# Patient Record
Sex: Female | Born: 1950 | Race: White | Hispanic: No | Marital: Single | Smoking: Former smoker
Health system: Southern US, Community
[De-identification: ages and names within clinical notes are randomized; demographics above are authoritative.]

## PROBLEM LIST (undated history)

## (undated) DIAGNOSIS — R7302 Impaired glucose tolerance (oral): Secondary | ICD-10-CM

## (undated) DIAGNOSIS — R0602 Shortness of breath: Secondary | ICD-10-CM

## (undated) DIAGNOSIS — D649 Anemia, unspecified: Secondary | ICD-10-CM

## (undated) DIAGNOSIS — Z803 Family history of malignant neoplasm of breast: Secondary | ICD-10-CM

## (undated) DIAGNOSIS — I2109 ST elevation (STEMI) myocardial infarction involving other coronary artery of anterior wall: Secondary | ICD-10-CM

## (undated) DIAGNOSIS — R112 Nausea with vomiting, unspecified: Secondary | ICD-10-CM

## (undated) DIAGNOSIS — Z9861 Coronary angioplasty status: Principal | ICD-10-CM

## (undated) DIAGNOSIS — I10 Essential (primary) hypertension: Secondary | ICD-10-CM

## (undated) DIAGNOSIS — T4145XA Adverse effect of unspecified anesthetic, initial encounter: Secondary | ICD-10-CM

## (undated) DIAGNOSIS — C801 Malignant (primary) neoplasm, unspecified: Secondary | ICD-10-CM

## (undated) DIAGNOSIS — Z923 Personal history of irradiation: Secondary | ICD-10-CM

## (undated) DIAGNOSIS — Z808 Family history of malignant neoplasm of other organs or systems: Secondary | ICD-10-CM

## (undated) DIAGNOSIS — Z8639 Personal history of other endocrine, nutritional and metabolic disease: Secondary | ICD-10-CM

## (undated) DIAGNOSIS — I2510 Atherosclerotic heart disease of native coronary artery without angina pectoris: Secondary | ICD-10-CM

## (undated) DIAGNOSIS — M069 Rheumatoid arthritis, unspecified: Secondary | ICD-10-CM

## (undated) DIAGNOSIS — N189 Chronic kidney disease, unspecified: Secondary | ICD-10-CM

## (undated) DIAGNOSIS — Z87891 Personal history of nicotine dependence: Secondary | ICD-10-CM

## (undated) DIAGNOSIS — T8859XA Other complications of anesthesia, initial encounter: Secondary | ICD-10-CM

## (undated) DIAGNOSIS — Z9889 Other specified postprocedural states: Secondary | ICD-10-CM

## (undated) DIAGNOSIS — E785 Hyperlipidemia, unspecified: Secondary | ICD-10-CM

## (undated) HISTORY — DX: Coronary angioplasty status: Z98.61

## (undated) HISTORY — DX: Hyperlipidemia, unspecified: E78.5

## (undated) HISTORY — DX: Personal history of other endocrine, nutritional and metabolic disease: Z86.39

## (undated) HISTORY — PX: APPENDECTOMY: SHX54

## (undated) HISTORY — DX: Impaired glucose tolerance (oral): R73.02

## (undated) HISTORY — DX: Essential (primary) hypertension: I10

## (undated) HISTORY — DX: ST elevation (STEMI) myocardial infarction involving other coronary artery of anterior wall: I21.09

## (undated) HISTORY — DX: Family history of malignant neoplasm of breast: Z80.3

## (undated) HISTORY — PX: BREAST EXCISIONAL BIOPSY: SUR124

## (undated) HISTORY — DX: Atherosclerotic heart disease of native coronary artery without angina pectoris: I25.10

## (undated) HISTORY — DX: Malignant (primary) neoplasm, unspecified: C80.1

## (undated) HISTORY — DX: Family history of malignant neoplasm of other organs or systems: Z80.8

## (undated) HISTORY — DX: Personal history of nicotine dependence: Z87.891

---
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
## 1993-10-15 HISTORY — PX: ANKLE SURGERY: SHX546

## 1998-12-23 ENCOUNTER — Other Ambulatory Visit: Admission: RE | Admit: 1998-12-23 | Discharge: 1998-12-23 | Payer: Self-pay | Admitting: Obstetrics and Gynecology

## 2000-01-19 ENCOUNTER — Encounter: Payer: Self-pay | Admitting: *Deleted

## 2000-01-19 ENCOUNTER — Encounter: Admission: RE | Admit: 2000-01-19 | Discharge: 2000-01-19 | Payer: Self-pay | Admitting: *Deleted

## 2001-05-21 ENCOUNTER — Encounter: Admission: RE | Admit: 2001-05-21 | Discharge: 2001-05-21 | Payer: Self-pay | Admitting: *Deleted

## 2001-05-21 ENCOUNTER — Encounter: Payer: Self-pay | Admitting: *Deleted

## 2001-09-17 ENCOUNTER — Other Ambulatory Visit: Admission: RE | Admit: 2001-09-17 | Discharge: 2001-09-17 | Payer: Self-pay | Admitting: Obstetrics and Gynecology

## 2002-08-05 ENCOUNTER — Encounter: Payer: Self-pay | Admitting: *Deleted

## 2002-08-05 ENCOUNTER — Encounter: Admission: RE | Admit: 2002-08-05 | Discharge: 2002-08-05 | Payer: Self-pay | Admitting: *Deleted

## 2002-10-09 ENCOUNTER — Other Ambulatory Visit: Admission: RE | Admit: 2002-10-09 | Discharge: 2002-10-09 | Payer: Self-pay | Admitting: Obstetrics & Gynecology

## 2003-08-11 ENCOUNTER — Ambulatory Visit (HOSPITAL_COMMUNITY): Admission: RE | Admit: 2003-08-11 | Discharge: 2003-08-11 | Payer: Self-pay | Admitting: *Deleted

## 2003-11-01 ENCOUNTER — Other Ambulatory Visit: Admission: RE | Admit: 2003-11-01 | Discharge: 2003-11-01 | Payer: Self-pay | Admitting: Obstetrics & Gynecology

## 2003-11-18 ENCOUNTER — Encounter: Admission: RE | Admit: 2003-11-18 | Discharge: 2003-11-18 | Payer: Self-pay | Admitting: Obstetrics & Gynecology

## 2004-08-11 ENCOUNTER — Ambulatory Visit (HOSPITAL_COMMUNITY): Admission: RE | Admit: 2004-08-11 | Discharge: 2004-08-11 | Payer: Self-pay

## 2005-08-17 ENCOUNTER — Ambulatory Visit (HOSPITAL_COMMUNITY): Admission: RE | Admit: 2005-08-17 | Discharge: 2005-08-17 | Payer: Self-pay | Admitting: *Deleted

## 2006-08-30 ENCOUNTER — Ambulatory Visit (HOSPITAL_COMMUNITY): Admission: RE | Admit: 2006-08-30 | Discharge: 2006-08-30 | Payer: Self-pay | Admitting: Obstetrics and Gynecology

## 2007-09-04 ENCOUNTER — Ambulatory Visit (HOSPITAL_COMMUNITY): Admission: RE | Admit: 2007-09-04 | Discharge: 2007-09-04 | Payer: Self-pay | Admitting: Obstetrics and Gynecology

## 2008-09-07 ENCOUNTER — Ambulatory Visit (HOSPITAL_COMMUNITY): Admission: RE | Admit: 2008-09-07 | Discharge: 2008-09-07 | Payer: Self-pay | Admitting: Obstetrics and Gynecology

## 2009-09-16 ENCOUNTER — Ambulatory Visit (HOSPITAL_COMMUNITY): Admission: RE | Admit: 2009-09-16 | Discharge: 2009-09-16 | Payer: Self-pay | Admitting: Obstetrics and Gynecology

## 2010-09-22 ENCOUNTER — Ambulatory Visit (HOSPITAL_COMMUNITY)
Admission: RE | Admit: 2010-09-22 | Discharge: 2010-09-22 | Payer: Self-pay | Source: Home / Self Care | Attending: Obstetrics and Gynecology | Admitting: Obstetrics and Gynecology

## 2010-11-03 ENCOUNTER — Encounter
Admission: RE | Admit: 2010-11-03 | Discharge: 2010-11-03 | Payer: No Typology Code available for payment source | Source: Home / Self Care | Attending: Obstetrics and Gynecology | Admitting: Obstetrics and Gynecology

## 2011-08-16 DIAGNOSIS — I2510 Atherosclerotic heart disease of native coronary artery without angina pectoris: Secondary | ICD-10-CM | POA: Insufficient documentation

## 2011-08-16 DIAGNOSIS — Z9861 Coronary angioplasty status: Secondary | ICD-10-CM | POA: Insufficient documentation

## 2011-08-16 DIAGNOSIS — I2109 ST elevation (STEMI) myocardial infarction involving other coronary artery of anterior wall: Secondary | ICD-10-CM

## 2011-08-16 HISTORY — DX: ST elevation (STEMI) myocardial infarction involving other coronary artery of anterior wall: I21.09

## 2011-08-16 HISTORY — DX: Atherosclerotic heart disease of native coronary artery without angina pectoris: I25.10

## 2011-08-16 HISTORY — DX: Coronary angioplasty status: Z98.61

## 2011-08-16 HISTORY — PX: CORONARY ANGIOPLASTY WITH STENT PLACEMENT: SHX49

## 2011-08-16 HISTORY — PX: CARDIAC CATHETERIZATION: SHX172

## 2011-08-16 HISTORY — PX: TRANSTHORACIC ECHOCARDIOGRAM: SHX275

## 2011-08-27 ENCOUNTER — Other Ambulatory Visit: Payer: Self-pay

## 2011-08-27 ENCOUNTER — Emergency Department (HOSPITAL_COMMUNITY): Payer: No Typology Code available for payment source

## 2011-08-27 ENCOUNTER — Inpatient Hospital Stay (HOSPITAL_COMMUNITY)
Admission: EM | Admit: 2011-08-27 | Discharge: 2011-08-31 | DRG: 246 | Disposition: A | Discharge status: Home / Self Care | Payer: No Typology Code available for payment source | Attending: Cardiology | Admitting: Cardiology

## 2011-08-27 ENCOUNTER — Encounter (HOSPITAL_COMMUNITY)
Admission: EM | Disposition: A | Discharge status: Home / Self Care | Payer: Self-pay | Source: Home / Self Care | Attending: Cardiology

## 2011-08-27 ENCOUNTER — Encounter: Payer: Self-pay | Admitting: Emergency Medicine

## 2011-08-27 DIAGNOSIS — I472 Ventricular tachycardia, unspecified: Secondary | ICD-10-CM | POA: Diagnosis present

## 2011-08-27 DIAGNOSIS — F17200 Nicotine dependence, unspecified, uncomplicated: Secondary | ICD-10-CM | POA: Diagnosis present

## 2011-08-27 DIAGNOSIS — I469 Cardiac arrest, cause unspecified: Secondary | ICD-10-CM | POA: Diagnosis present

## 2011-08-27 DIAGNOSIS — E876 Hypokalemia: Secondary | ICD-10-CM | POA: Diagnosis present

## 2011-08-27 DIAGNOSIS — R112 Nausea with vomiting, unspecified: Secondary | ICD-10-CM | POA: Diagnosis present

## 2011-08-27 DIAGNOSIS — I10 Essential (primary) hypertension: Secondary | ICD-10-CM | POA: Diagnosis present

## 2011-08-27 DIAGNOSIS — I498 Other specified cardiac arrhythmias: Secondary | ICD-10-CM | POA: Diagnosis present

## 2011-08-27 DIAGNOSIS — I213 ST elevation (STEMI) myocardial infarction of unspecified site: Secondary | ICD-10-CM | POA: Diagnosis present

## 2011-08-27 DIAGNOSIS — I2109 ST elevation (STEMI) myocardial infarction involving other coronary artery of anterior wall: Principal | ICD-10-CM | POA: Diagnosis present

## 2011-08-27 DIAGNOSIS — Z720 Tobacco use: Secondary | ICD-10-CM | POA: Diagnosis present

## 2011-08-27 DIAGNOSIS — I4901 Ventricular fibrillation: Secondary | ICD-10-CM | POA: Diagnosis present

## 2011-08-27 DIAGNOSIS — E7800 Pure hypercholesterolemia, unspecified: Secondary | ICD-10-CM | POA: Diagnosis present

## 2011-08-27 HISTORY — PX: ABDOMINAL AORTAGRAM: SHX5454

## 2011-08-27 HISTORY — PX: PERCUTANEOUS CORONARY STENT INTERVENTION (PCI-S): SHX5485

## 2011-08-27 HISTORY — PX: LEFT HEART CATHETERIZATION WITH CORONARY ANGIOGRAM: SHX5451

## 2011-08-27 LAB — CK TOTAL AND CKMB (NOT AT ARMC): CK, MB: 2.3 ng/mL (ref 0.3–4.0)

## 2011-08-27 LAB — POCT I-STAT, CHEM 8
Calcium, Ion: 1.06 mmol/L — ABNORMAL LOW (ref 1.12–1.32)
Chloride: 105 mEq/L (ref 96–112)
Glucose, Bld: 165 mg/dL — ABNORMAL HIGH (ref 70–99)
HCT: 44 % (ref 36.0–46.0)
Hemoglobin: 15 g/dL (ref 12.0–15.0)
TCO2: 23 mmol/L (ref 0–100)

## 2011-08-27 LAB — CBC
HCT: 41.8 % (ref 36.0–46.0)
MCH: 28.7 pg (ref 26.0–34.0)
MCHC: 32.8 g/dL (ref 30.0–36.0)
MCV: 87.4 fL (ref 78.0–100.0)
RDW: 12.2 % (ref 11.5–15.5)

## 2011-08-27 LAB — POCT ACTIVATED CLOTTING TIME: Activated Clotting Time: 557 seconds

## 2011-08-27 LAB — COMPREHENSIVE METABOLIC PANEL
Albumin: 4 g/dL (ref 3.5–5.2)
Alkaline Phosphatase: 88 U/L (ref 39–117)
BUN: 14 mg/dL (ref 6–23)
Chloride: 101 mEq/L (ref 96–112)
Potassium: 3.2 mEq/L — ABNORMAL LOW (ref 3.5–5.1)
Total Bilirubin: 0.3 mg/dL (ref 0.3–1.2)

## 2011-08-27 LAB — PROTIME-INR: Prothrombin Time: 13.3 seconds (ref 11.6–15.2)

## 2011-08-27 LAB — PRO B NATRIURETIC PEPTIDE: Pro B Natriuretic peptide (BNP): 72.8 pg/mL (ref 0–125)

## 2011-08-27 SURGERY — LEFT HEART CATHETERIZATION WITH CORONARY ANGIOGRAM
Anesthesia: LOCAL

## 2011-08-27 MED ORDER — POTASSIUM CHLORIDE 20 MEQ/15ML (10%) PO LIQD
ORAL | Status: AC
  Filled 2011-08-27: qty 30

## 2011-08-27 MED ORDER — ACETAMINOPHEN 325 MG PO TABS: 650.0000 mg | ORAL_TABLET | ORAL | Status: DC | PRN

## 2011-08-27 MED ORDER — DEXTROSE 5 % IV SOLN
60.0000 mg/h | INTRAVENOUS | Status: DC
  Administered 2011-08-27: 60 mg/h via INTRAVENOUS
  Filled 2011-08-27: qty 9

## 2011-08-27 MED ORDER — PROMETHAZINE HCL 25 MG/ML IJ SOLN
12.5000 mg | Freq: Once | INTRAMUSCULAR | Status: AC
  Administered 2011-08-27: 12.5 mg via INTRAVENOUS

## 2011-08-27 MED ORDER — MORPHINE SULFATE 2 MG/ML IJ SOLN
2.0000 mg | INTRAMUSCULAR | Status: DC | PRN
  Administered 2011-08-27: 2 mg via INTRAVENOUS
  Filled 2011-08-27: qty 1

## 2011-08-27 MED ORDER — NITROGLYCERIN IN D5W 200-5 MCG/ML-% IV SOLN
2.0000 ug/min | INTRAVENOUS | Status: DC
  Administered 2011-08-27: 10 ug/min via INTRAVENOUS

## 2011-08-27 MED ORDER — DEXTROSE 5 % IV SOLN
150.0000 mg | Freq: Once | INTRAVENOUS | Status: DC
  Administered 2011-08-27: 150 mg via INTRAVENOUS

## 2011-08-27 MED ORDER — ZOLPIDEM TARTRATE 10 MG PO TABS
10.0000 mg | ORAL_TABLET | Freq: Every evening | ORAL | Status: DC | PRN
  Administered 2011-08-29 – 2011-08-30 (×2): 10 mg via ORAL
  Filled 2011-08-27 (×2): qty 2

## 2011-08-27 MED ORDER — ASPIRIN 81 MG PO CHEW
CHEWABLE_TABLET | ORAL | Status: AC
  Administered 2011-08-28: 81 mg via ORAL
  Filled 2011-08-27: qty 4

## 2011-08-27 MED ORDER — NITROGLYCERIN 0.4 MG SL SUBL: 0.4000 mg | SUBLINGUAL_TABLET | SUBLINGUAL | Status: DC | PRN

## 2011-08-27 MED ORDER — TICAGRELOR 90 MG PO TABS
90.0000 mg | ORAL_TABLET | Freq: Two times a day (BID) | ORAL | Status: DC
  Administered 2011-08-27 – 2011-08-31 (×8): 90 mg via ORAL
  Filled 2011-08-27 (×9): qty 1

## 2011-08-27 MED ORDER — SODIUM CHLORIDE 0.9 % IV SOLN
0.2500 mg/kg/h | INTRAVENOUS | Status: AC
  Administered 2011-08-27: 0.25 mg/kg/h via INTRAVENOUS
  Filled 2011-08-27 (×5): qty 250

## 2011-08-27 MED ORDER — HEPARIN BOLUS VIA INFUSION
4000.0000 [IU] | Freq: Once | INTRAVENOUS | Status: AC
  Administered 2011-08-27: 4000 [IU] via INTRAVENOUS

## 2011-08-27 MED ORDER — ROSUVASTATIN CALCIUM 40 MG PO TABS
40.0000 mg | ORAL_TABLET | Freq: Every day | ORAL | Status: DC
  Administered 2011-08-28 – 2011-08-30 (×2): 40 mg via ORAL
  Filled 2011-08-27 (×6): qty 1

## 2011-08-27 MED ORDER — HEPARIN SODIUM (PORCINE) 5000 UNIT/ML IJ SOLN
INTRAMUSCULAR | Status: AC
  Filled 2011-08-27: qty 1

## 2011-08-27 MED ORDER — ASPIRIN 81 MG PO TABS
81.0000 mg | ORAL_TABLET | Freq: Every day | ORAL | Status: DC
  Filled 2011-08-27: qty 1

## 2011-08-27 MED ORDER — HEPARIN (PORCINE) IN NACL 2-0.9 UNIT/ML-% IJ SOLN
INTRAMUSCULAR | Status: AC
  Filled 2011-08-27: qty 2000

## 2011-08-27 MED ORDER — SODIUM CHLORIDE 0.9 % IV SOLN
INTRAVENOUS | Status: DC
  Administered 2011-08-27 (×2): via INTRAVENOUS

## 2011-08-27 MED ORDER — ONDANSETRON HCL 4 MG/2ML IJ SOLN: 4.0000 mg | Freq: Four times a day (QID) | INTRAMUSCULAR | Status: DC | PRN

## 2011-08-27 MED ORDER — NITROGLYCERIN 0.2 MG/ML ON CALL CATH LAB
INTRAVENOUS | Status: AC
  Filled 2011-08-27: qty 1

## 2011-08-27 MED ORDER — LIDOCAINE HCL (PF) 1 % IJ SOLN
INTRAMUSCULAR | Status: AC
  Filled 2011-08-27: qty 30

## 2011-08-27 MED ORDER — ONDANSETRON HCL 4 MG/2ML IJ SOLN
4.0000 mg | Freq: Four times a day (QID) | INTRAMUSCULAR | Status: DC | PRN
  Administered 2011-08-27: 4 mg via INTRAVENOUS
  Filled 2011-08-27: qty 2

## 2011-08-27 MED ORDER — TICAGRELOR 90 MG PO TABS
ORAL_TABLET | ORAL | Status: AC
  Filled 2011-08-27: qty 2

## 2011-08-27 MED ORDER — PNEUMOCOCCAL VAC POLYVALENT 25 MCG/0.5ML IJ INJ
0.5000 mL | INJECTION | INTRAMUSCULAR | Status: AC
  Administered 2011-08-28: 0.5 mL via INTRAMUSCULAR
  Filled 2011-08-27: qty 0.5

## 2011-08-27 MED ORDER — FENTANYL CITRATE 0.05 MG/ML IJ SOLN
INTRAMUSCULAR | Status: AC
  Filled 2011-08-27: qty 2

## 2011-08-27 MED ORDER — MIDAZOLAM HCL 2 MG/2ML IJ SOLN
INTRAMUSCULAR | Status: AC
  Filled 2011-08-27: qty 2

## 2011-08-27 MED ORDER — ONDANSETRON HCL 4 MG/2ML IJ SOLN
INTRAMUSCULAR | Status: AC
Start: 2011-08-27 — End: 2011-08-27
  Filled 2011-08-27: qty 2

## 2011-08-27 MED ORDER — ASPIRIN 81 MG PO CHEW
81.0000 mg | CHEWABLE_TABLET | Freq: Every day | ORAL | Status: DC
  Administered 2011-08-28 – 2011-08-30 (×3): 81 mg via ORAL
  Filled 2011-08-27 (×3): qty 1

## 2011-08-27 MED ORDER — ASPIRIN 300 MG RE SUPP: 300.0000 mg | RECTAL | Status: AC

## 2011-08-27 MED ORDER — BIOTENE DRY MOUTH MT LIQD: 15.0000 mL | OROMUCOSAL | Status: DC | PRN

## 2011-08-27 MED ORDER — ASPIRIN 81 MG PO CHEW: 324.0000 mg | CHEWABLE_TABLET | Freq: Once | ORAL | Status: DC

## 2011-08-27 MED ORDER — DEXTROSE 5 % IV SOLN: 150.0000 mg | Freq: Once | INTRAVENOUS | Status: DC

## 2011-08-27 MED FILL — Medication: Qty: 1 | Status: AC

## 2011-08-27 NOTE — Progress Notes (Signed)
Spiritual Care   Provided emotional and spiritual support to family while they patient was in procedure.  Stayed with them until their own pastor arrived.  Althia Forts [phone]

## 2011-08-27 NOTE — Progress Notes (Signed)
At 1515 the femoral sheath was removed and manual pressure held for 20 minutes with Seth Bake RN present at bedside for the first 5 minutes. Vital signs were stable pre and post sheath removal. Blood pressure remained 14080,s and Heart Rate in the 90's. Hemostasis achieved and no oozing or hematoma noted. Luellen Pucker RN notified and verified hemostasis and Level 0. Patient given post sheath removal instructions and verbally states understanding. Bedrest starts at 1535.

## 2011-08-27 NOTE — ED Provider Notes (Signed)
History     CSN: [id number] Arrival date & time: 08/27/2011  7:39 AM   First MD Initiated Contact with Patient 08/27/11 [phone]      Chief Complaint  Patient presents with  . Back Pain    (Consider location/radiation/quality/duration/timing/severity/associated sxs/prior treatment) HPI Comments: Prior to going to work, pt developed severe pain in upper back, aching, radiation to shoulder - diaphoretic, tried to make it to work without imrpvoement - left from work to hospital, on arrival wrapping around to the chest.  No hx of MI, DM, Ht.  Patient is a 60 y.o. female presenting with back pain. The history is provided by the patient.  Back Pain  This is a new problem. The current episode started 1 to 2 hours ago. The problem occurs constantly. The problem has been gradually worsening. Associated with: acute onset of pain in upper back with radiation to the shoulder, then around to the chest 2 hours pta. The quality of the pain is described as aching. The pain is at a severity of 10/10. The pain is moderate. Exacerbated by: nothing. The pain is the same all the time. Associated symptoms include chest pain. Pertinent negatives include no fever, no numbness, no headaches, no abdominal pain, no dysuria and no weakness. Treatments tried: no meds pta. Risk factors: cholesterol and tob use.    Past Medical History  Diagnosis Date  . Hypercholesteremia     History reviewed. No pertinent past surgical history.  History reviewed. No pertinent family history.  History  Substance Use Topics  . Smoking status: Current Everyday Smoker  . Smokeless tobacco: Not on file  . Alcohol Use: No    OB History    Grav Para Term Preterm Abortions TAB SAB Ect Mult Living                  Review of Systems  Constitutional: Negative for fever.  Cardiovascular: Positive for chest pain.  Gastrointestinal: Negative for abdominal pain.  Genitourinary: Negative for dysuria.  Musculoskeletal: Positive for  back pain.  Neurological: Negative for weakness, numbness and headaches.  All other systems reviewed and are negative.    Allergies  Review of patient's allergies indicates no known allergies.  Home Medications  No current outpatient prescriptions on file.  BP 163/98  Pulse 93  Temp(Src) 97.6 F (36.4 C) (Oral)  Resp 20  SpO2 99%  Physical Exam  Nursing note and vitals reviewed. Constitutional: She appears well-developed and well-nourished.       Uncomfortable appearing   HENT:  Head: Normocephalic and atraumatic.  Mouth/Throat: Oropharynx is clear and moist. No oropharyngeal exudate.  Eyes: Conjunctivae and EOM are normal. Pupils are equal, round, and reactive to light. Right eye exhibits no discharge. Left eye exhibits no discharge. No scleral icterus.  Neck: Normal range of motion. Neck supple. No JVD present. No thyromegaly present.  Cardiovascular: Normal rate, regular rhythm, normal heart sounds and intact distal pulses.  Exam reveals no gallop and no friction rub.   No murmur heard. Pulmonary/Chest: Effort normal and breath sounds normal. No respiratory distress. She has no wheezes. She has no rales.  Abdominal: Soft. Bowel sounds are normal. She exhibits no distension and no mass. There is no tenderness.  Musculoskeletal: Normal range of motion. She exhibits no edema and no tenderness.  Lymphadenopathy:    She has no cervical adenopathy.  Neurological: She is alert. Coordination normal.  Skin: Skin is warm. No rash noted. She is diaphoretic. No erythema.  Psychiatric: She  has a normal mood and affect. Her behavior is normal.    ED Course  Procedures (including critical care time)  Labs Reviewed  CBC - Abnormal; Notable for the following:    WBC 10.9 (*)    All other components within normal limits  POCT I-STAT, CHEM 8 - Abnormal; Notable for the following:    Potassium 3.3 (*)    Glucose, Bld 165 (*)    Calcium, Ion 1.06 (*)    All other components within  normal limits  POCT I-STAT TROPONIN I  PROTIME-INR  APTT  I-STAT, CHEM 8  I-STAT TROPONIN I  CK TOTAL AND CKMB  COMPREHENSIVE METABOLIC PANEL  PRO B NATRIURETIC PEPTIDE  TROPONIN I   No results found.   No diagnosis found.    MDM  Pt ill appearing with ECG that showed STEMI - immediately called Code STEMI.    Peripheral IV was placed and immediately she then proceeded to go into Ventricular Fibrillation arrest - lost pulse, monitor showed V fib and I immediately placed her on Zoll Def - began CPR, provided supplemental O2 and defibrillated at 200J biphasic.  There was no ROSC, I continued CPR for the next 10 minutes and pt was defibrillated another 3 times finally returning to an initial bradycardia with a pulse followed by Sinus Tachycardia.  Her BP normalized, she was given 150mg  of amiodarone.  During this resucitation she became nauseated and vomitted multiple times.  We prepared for intubation prior to ROSC but after she had received a small am't of etomidate she awoke and regained a pulse.  Her care was immediately d/w Dr. Claiborne Billings who agreed to go straight to Cath lab.    Medications given:  ASA 325 Heparin 4000Units Amiodarone 150mg   Procedures performed by myself  1.  CPR (continuous for extent of pulseless period with good pulses with compressions) (emergent situation)  2.  Defibrillation X 4 (emergent situation)    I accompanied pt to Cath lab and made transition of care to Dr. Claiborne Billings at the bedside.    ED ECG REPORT   Date: 08/27/2011   Rate: 95  Rhythm: normal sinus rhythm  QRS Axis: normal  Intervals: normal  ST/T Wave abnormalities: ST elevations anteriorly, ST elevations laterally, ST depressions inferiorly and acute myocardial infarction  Conduction Disutrbances:none  Narrative Interpretation: STEMI  Old EKG Reviewed: none available  CRITICAL CARE Performed by: Johnna Acosta   Total critical care time: 30  Critical care time was exclusive of  separately billable procedures and treating other patients.  Critical care was necessary to treat or prevent imminent or life-threatening deterioration.  Critical care was time spent personally by me on the following activities: development of treatment plan with patient and/or surrogate as well as nursing, discussions with consultants, evaluation of patient's response to treatment, examination of patient, obtaining history from patient or surrogate, ordering and performing treatments and interventions, ordering and review of laboratory studies, ordering and review of radiographic studies, pulse oximetry and re-evaluation of patient's condition.   Johnna Acosta, MD 08/27/11 [phone]

## 2011-08-27 NOTE — H&P (Addendum)
Samantha Mcbride is an 60 y.o. female.   Chief Complaint: Chest Pain HPI: 60 yo female with a history of dyslipidemia and tobacco use presents with Anterior STEMI and ventricular fibrillation requiring CPR.   She was taken urgently to the cath lab where she was found to have a totalled LAD.  She subsequently received a DES to the LAD.  The patient reports the development of back pain at approximately 0600hrs today.  She went to work and the pain got worse.  She also became diaphoretic and vomited several times.  She then drove herself to the emergency room having to stop and vomit along the way.  Past Medical History  Diagnosis Date  . Hypercholesteremia     History reviewed. No pertinent past surgical history.  History reviewed. No pertinent family history. Social History:  reports that she has been smoking.  She does not have any smokeless tobacco history on file. She reports that she does not drink alcohol or use illicit drugs.  Medications Prior to Admission  Medication Dose Route Frequency Provider Last Rate Last Dose  . amiodarone (CORDARONE) 150 mg in dextrose 5 % 100 mL bolus  150 mg Intravenous Once Meera Krishnavadan Patel, [facility]      . amiodarone (CORDARONE) 450 mg in dextrose 5 % 250 mL infusion  60 mg/hr Intravenous Continuous Troy Sine, MD      . aspirin 81 MG chewable tablet           . aspirin chewable tablet 324 mg  324 mg Oral Once Johnna Acosta, MD      . fentaNYL (SUBLIMAZE) 0.05 MG/ML injection           . heparin 100 units/mL bolus via infusion 4,000 Units  4,000 Units Intravenous Once Johnna Acosta, MD   4,000 Units at 08/27/11 0750  . heparin 2-0.9 UNIT/ML-% infusion           . heparin 5000 UNIT/ML injection           . midazolam (VERSED) 2 MG/2ML injection           . nitroGLYCERIN (NTG ON-CALL) 0.2 mg/mL injection           . ondansetron (ZOFRAN) 4 MG/2ML injection           . Ticagrelor (BRILINTA) 90 MG tablet           . DISCONTD: amiodarone (CORDARONE)  150 mg in dextrose 5 % 100 mL bolus  150 mg Intravenous Once Johnna Acosta, MD   150 mg at 08/27/11 0830   No current outpatient prescriptions on file as of 08/27/2011.   Prescriptions prior to admission  Medication Sig Dispense Refill  . aspirin 81 MG tablet Take 81 mg by mouth daily.        Marland Kitchen atorvastatin (LIPITOR) 10 MG tablet Take 10 mg by mouth daily.        . traZODone (DESYREL)  Take by mouth at bedtime. Dose unknown         Allergies: No Known Allergies  Medications Prior to Admission  Medication Dose Route Frequency Provider Last Rate Last Dose  . amiodarone (CORDARONE) 150 mg in dextrose 5 % 100 mL bolus  150 mg Intravenous Once Johnna Acosta, MD   150 mg at 08/27/11 0830  . amiodarone (CORDARONE) 450 mg in dextrose 5 % 250 mL infusion  60 mg/hr Intravenous Continuous Troy Sine, MD      . aspirin 81  MG chewable tablet           . aspirin chewable tablet 324 mg  324 mg Oral Once Johnna Acosta, MD      . fentaNYL (SUBLIMAZE) 0.05 MG/ML injection           . heparin 100 units/mL bolus via infusion 4,000 Units  4,000 Units Intravenous Once Johnna Acosta, MD   4,000 Units at 08/27/11 0750  . heparin 2-0.9 UNIT/ML-% infusion           . heparin 5000 UNIT/ML injection           . midazolam (VERSED) 2 MG/2ML injection           . nitroGLYCERIN (NTG ON-CALL) 0.2 mg/mL injection           . ondansetron (ZOFRAN) 4 MG/2ML injection           . Ticagrelor (BRILINTA) 90 MG tablet            No current outpatient prescriptions on file as of 08/27/2011.    Results for orders placed during the hospital encounter of 08/27/11 (from the past 48 hour(s))  CBC     Status: Abnormal   Collection Time   08/27/11  7:43 AM      Component Value Range Comment   WBC 10.9 (*) 4.0 - 10.5 (K/uL)    RBC 4.78  3.87 - 5.11 (MIL/uL)    Hemoglobin 13.7  12.0 - 15.0 (g/dL)    HCT 41.8  36.0 - 46.0 (%)    MCV 87.4  78.0 - 100.0 (fL)    MCH 28.7  26.0 - 34.0 (pg)    MCHC 32.8  30.0 - 36.0 (g/dL)     RDW 12.2  11.5 - 15.5 (%)    Platelets 291  150 - 400 (K/uL)   POCT I-STAT TROPONIN I     Status: Normal   Collection Time   08/27/11  7:45 AM      Component Value Range Comment   Troponin i, poc 0.03  0.00 - 0.08 (ng/mL)    Comment 3            POCT I-STAT, CHEM 8     Status: Abnormal   Collection Time   08/27/11  7:47 AM      Component Value Range Comment   Sodium 140  135 - 145 (mEq/L)    Potassium 3.3 (*) 3.5 - 5.1 (mEq/L)    Chloride 105  96 - 112 (mEq/L)    BUN 15  6 - 23 (mg/dL)    Creatinine, Ser 0.70  0.50 - 1.10 (mg/dL)    Glucose, Bld 165 (*) 70 - 99 (mg/dL)    Calcium, Ion 1.06 (*) 1.12 - 1.32 (mmol/L)    TCO2 23  0 - 100 (mmol/L)    Hemoglobin 15.0  12.0 - 15.0 (g/dL)    HCT 44.0  36.0 - 46.0 (%)   PROTIME-INR     Status: Normal   Collection Time   08/27/11  7:47 AM      Component Value Range Comment   Prothrombin Time 13.3  11.6 - 15.2 (seconds)    INR 0.99  0.00 - 1.49    APTT     Status: Normal   Collection Time   08/27/11  7:47 AM      Component Value Range Comment   aPTT 25  24 - 37 (seconds)    No results found.  Review of Systems  Unable to perform ROS   Blood pressure 163/98, pulse 93, temperature 97.6 F (36.4 C), temperature source Oral, resp. rate 20, SpO2 99.00%. Physical Exam   Assessment/Plan Patient Active Hospital Problem List: STEMI (ST elevation myocardial infarction) (08/27/2011)  DES to the LAD CAD (coronary artery disease) (08/27/2011) Hypokalemia Dyslipidemia   Plan: Per MD.  Brett Canales 08/27/2011, 8:55 AM

## 2011-08-27 NOTE — ED Notes (Signed)
Primary assessment done by this RN

## 2011-08-27 NOTE — Interval H&P Note (Signed)
History and Physical Interval Note:   08/27/2011   9:44 AM   Samantha Mcbride  has presented today for surgery, with the diagnosis of chest pain  The various methods of treatment have been discussed with the patient and family. After consideration of risks, benefits and other options for treatment, the patient has consented to  Procedure(s): Hurricane (PCI-S) ABDOMINAL AORTAGRAM as a surgical intervention .  The patients' history has been reviewed, patient examined, no change in status, stable for surgery.  I have reviewed the patients' chart and labs.  Questions were answered to the patient's satisfaction.     KELLY,THOMAS A  MD Pt seen and examined. Patient developed new-onset back discomfort at approximately 6 AM this morning while at home. She went to work. While at work for chest pain be her back pain evolves into chest pain. She became diaphoretic, nauseated and vomited. She drove herself to the emergency [facility] emergency room where in route she also had a noted episode of vomiting. Upon arrival to the [facility] emergency room ECG revealed acute ST segment elevation anteriorly consistent with an anterior wall ST segment elevation myocardial infarction. In the emergency room, the patient became hypotensive, but developed ventricular fibrillation. She was successfully defibrillated back into a more stable rhythm. She is now presents acutely to the cardiac catheterization laboratory for emergent cardiac catheterization and possible percutaneous coronary intervention. KELLY,THOMAS A 08/27/2011 9:49 AM

## 2011-08-27 NOTE — Op Note (Signed)
Emergent cardiac catheterization and percutaneous coronary intervention .  Samantha Mcbride medical record [id number] date of birth [birth year]   This is Samantha Mcbride is a 60 year old female who has a history of hyperlipidemia and hypertension. She she developed new onset back discomfort early this morning at approximately 6 AM . She went to work while at work her back pain grassed to involve her chest this was associated with the development of diaphoresis, nausea and vomiting. She left work, and drove herself to [facility]. In route, she had to stop secondary to vomiting. She presented to [facility] emergency room and initial ECG showed ST segment elevation anterior wall myocardial infarction. While in the emergency room, she developed a ventricular fibrillation cardiac arrest, and required defibrillation as well as CPR. She came back with a narrow complex rhythm. She is now taken emergently to the cardiac catheterization laboratory for acute catheterization.  Procedure: Upon arrival to the cardiac catheterization laboratory, patient had residual chest tightness. A right femoral artery was punctured anteriorly and a 6 French sheath was inserted. A 6 French left JR 4 diagnostic catheter was used for selective angiography into the left coronary system. A 6 French right catheter was used for selective angiography into the right current artery. With the demonstration of total occlusion of the proximal LAD, a 6 French XB LAD guiding catheter was then inserted. Angiomax bolus plus infusion was administered. The patient was given relent to 180 mg orally and previously had received aspirin in the emergency room. Initially, and a socking medium wire was advanced into the LAD and was able to cross the total occlusion. This wire was passed into the second diagonal vessel was unable to navigate into the mid LAD. A 2.5x15 mm emergency room was inserted and several dilatations were made at the site of total occlusion.  This reestablished flow down the LAD system. A new wire, a prowater wire was then also inserted and advanced into the mid distal LAD. The balloon was then positioned over the pro-water wire and a 3.0x12 mm Amerge balloon was used and several dilatations were made in the proximal LAD. A 3.5x24 mm Promus DES stent was then inserted with careful positioning to cover the entire long proximal LAD lesion. The wire down the diagonal vessel was then removed prior to stent deployment. The stent was then successfully deployed x2 up to 13 atmospheres. A 3.75x15 mm noncompliant sprinter balloon was used for post-stent dilatation up to approximately 3.72 mm size. Scout angiography confirmed an excellent angiographic result the 100% occlusion was reduced to 0%. Patient did receive several doses of intracoronary nitroglycerin. Because of some initial haziness the decision was also made to continue the patient on on Angiomax approximately 4 hours post procedure. He wire and balloon were then removed. A guiding catheter was removed. A 6 French pigtail catheter was then inserted in RAO ventriculography was performed. Distal aortography was done. The arterial sheath was sutured in place with plans for sheath removal later today.  Hemodynamic data: Central aortic pressure 126/70 Left ventricular pressure 126/14/18  Angiographic data: The left main coronary artery was a normal appearing vessel which bifurcated into the LAD and left circumflex coronary artery.   The left anterior descending artery gave rise to a very proximal septal perforating artery and then began to taper with narrowing of 50%.  The left circumflex coronary artery was angiographically normal it gave rise to a bifurcating obtuse marginal branch.  The right coronary artery was a large caliber dominant vessel  that had mild 20% proximal and distal luminal smooth narrowing. The right coronary artery and a large posterolateral coronary artery. There was no  evidence for any collateralization to the LAD system via the right coronary artery.  Ventriculography revealed acute moderate left ventricular dysfunction with severe hypokinesis involving the mid distal anterolateral lateral extending around the apex to involve the distal inferoapical segment. The initial ejection fraction estimate is in the range of 35-40%. There is vigorous contractility involving the basal rales both anteriorly and posteriorly.wing percutaneous coronary intervention to the left anterior descending artery with PTCA, stenting with a 3.5x24 mm Promus element DES stent, postdilated to 3.72 mm, the 100% proximal LAD occlusion was successfully opened and reduced to 0%. The initial TIMI 0 flow improved to TIMI 3 flow. There was no evidence for any dissection.  Distal aortography reveals mild tortuosity but no significant obstructive disease. Renal arteries are patent bilaterally.    Impression: #1.  Acute ST segment elevation anterior lateral myocardial infarction secondary to total proximal LAD occlusion  #2. Normal left circumflex coronary artery  #3. Dominant right coronary artery with mild 20% luminal narrowing  #4. Successful percutaneous coronary intervention of the LAD with ultimate insertion of a 3.5x24 mm Promus element DES stent postdilated to 3.72 mm with the 100% occlusion reduced to 0% with TIMI 0 flow improved to TIMI 3 flow. Number  #5 patient arrived in the [facility] cardiac catheterization laboratory at 07:52; Time to first balloon inflation 08:10.

## 2011-08-27 NOTE — Code Documentation (Signed)
Dr. [name] Heck at bedside the entire time doing compressions. Return of pulses brady rate 55-65 with pulses. Patient will open eyes to voice. Family notified of patient arrival at patients request upon arrival to ed.

## 2011-08-27 NOTE — Consult Note (Signed)
Tobacco Cessation- Pt is a 1ppd smoker and says " I'm gonna have to quit now." Wants to use the patch for quitting. Recommended 21 mg patch x 6 wks, 14 mg patch for 2 wks, and 7 mg patch x 2 wks. Discussed patch use instructions including length and dosage. Referred pt to 1-800-quit-now for f/u and support. Discussed oral fixation substitutes, 2nd hand smoke and in home smoking policy. Reviewed and gave pt written education/contact information.

## 2011-08-27 NOTE — ED Notes (Signed)
Pt here from work c/o back pain between shoulder blades with nausea and diaphoresis; primary assessment done by this RN

## 2011-08-27 NOTE — ED Notes (Signed)
Patient vomited x 1 , emergently taken to cath lab.

## 2011-08-28 ENCOUNTER — Encounter (HOSPITAL_COMMUNITY): Payer: Self-pay

## 2011-08-28 DIAGNOSIS — I472 Ventricular tachycardia: Secondary | ICD-10-CM | POA: Diagnosis present

## 2011-08-28 LAB — COMPREHENSIVE METABOLIC PANEL
ALT: 38 U/L — ABNORMAL HIGH (ref 0–35)
AST: 70 U/L — ABNORMAL HIGH (ref 0–37)
Albumin: 3.7 g/dL (ref 3.5–5.2)
Calcium: 9.2 mg/dL (ref 8.4–10.5)
GFR calc Af Amer: >90 mL/min (ref >90)
Sodium: 137 mEq/L (ref 135–145)
Total Protein: 7.3 g/dL (ref 6.0–8.3)

## 2011-08-28 LAB — CBC
MCH: 29.6 pg (ref 26.0–34.0)
MCHC: 33.8 g/dL (ref 30.0–36.0)
Platelets: 254 10*3/uL (ref 150–400)
RBC: 4.49 MIL/uL (ref 3.87–5.11)

## 2011-08-28 LAB — BASIC METABOLIC PANEL
Calcium: 8.9 mg/dL (ref 8.4–10.5)
GFR calc Af Amer: >90 mL/min (ref >90)
GFR calc non Af Amer: >90 mL/min (ref >90)
Sodium: 136 mEq/L (ref 135–145)

## 2011-08-28 LAB — CARDIAC PANEL(CRET KIN+CKTOT+MB+TROPI)
Relative Index: 4.3 — ABNORMAL HIGH (ref 0.0–2.5)
Troponin I: 8.96 ng/mL (ref <0.30)

## 2011-08-28 MED ORDER — DOXYCYCLINE HYCLATE 100 MG PO TBEC
100.0000 mg | DELAYED_RELEASE_TABLET | Freq: Every day | ORAL | Status: DC
  Administered 2011-08-28: 100 mg via ORAL
  Filled 2011-08-28 (×2): qty 1

## 2011-08-28 MED ORDER — CARVEDILOL 6.25 MG PO TABS
6.2500 mg | ORAL_TABLET | Freq: Two times a day (BID) | ORAL | Status: DC
  Administered 2011-08-28 – 2011-08-31 (×6): 6.25 mg via ORAL
  Filled 2011-08-28 (×9): qty 1

## 2011-08-28 MED ORDER — NICOTINE 21 MG/24HR TD PT24
21.0000 mg | MEDICATED_PATCH | Freq: Every day | TRANSDERMAL | Status: DC
  Administered 2011-08-28 – 2011-08-31 (×4): 21 mg via TRANSDERMAL
  Filled 2011-08-28 (×4): qty 1

## 2011-08-28 NOTE — Progress Notes (Signed)
CRITICAL VALUE ALERT  Critical value received:  CK-MB 26.6; Troponin 8.96  Date of notification:  08/28/2011  Time of notification:  1010  Critical value read back:yes  Nurse who received alert:  Acey Lav  MD notified (1st page):  N/A  Time of first page:  N/A  MD notified (2nd page):  Time of second page:  Responding MD:  N/A  Time MD responded:  N/A

## 2011-08-28 NOTE — Progress Notes (Signed)
[place] DAILY PROGRESS NOTE  KHRISTINA NILGES   [id number] June 05, 1951   08/27/2011 Date of Admission:   Patient Description   60 y.o. female with PMH: HTN, HLD presented with Anterior STEMI - sx began 6AM with back pain that moved to her chest.  + diaphoresis, N/V --> [facility] (0739) --> Vtach arrest, Defibrillation & CPR with spontaneous recovery of rhythm & consciousnes. --> to Cath lab [phone]) for Ant STEMI - LAD occluded - PCI with DES (Balloon 0810).  Amiodarone infusion initiated.     Subjective:  Chest still a "little sore" from CPR, but no more anginal pain.  No SOB.  A little nauseated o/n - but better now.  Objective:  Temp:  [98.1 F (36.7 C)-99.5 F (37.5 C)] 99 F (37.2 C) (11/13 0350) Pulse Rate:  [84-100] 88  (11/13 0600) Resp:  [20-29] 21  (11/13 0600) BP: (110-148)/(73-96) 139/77 mmHg (11/13 0600) SpO2:  [94 %-97 %] 97 % (11/13 0600) Weight:  [79 kg (174 lb 2.6 oz)] 174 lb 2.6 oz (79 kg) (11/12 1140) Weight change:   Intake/Output from previous day: 11/12 0701 - 11/13 0700 In: 1086.6 [P.O.:60; I.V.:1026.6] Out: 1775 [Urine:1775] Intake/Output from this shift:   Physical Exam: General appearance: alert, appears older than stated age, no distress and mildly obese Neck: no adenopathy, no carotid bruit, no JVD, supple, symmetrical, trachea midline and thyroid not enlarged, symmetric, no tenderness/mass/nodules Lungs: clear to auscultation bilaterally and non-labored, good air movement. Heart: regular rate and rhythm, S1, S2 normal, no murmur, click, rub or gallop Abdomen: soft, non-tender; bowel sounds normal; no masses,  no organomegaly Extremities: extremities normal, atraumatic, no cyanosis or edema Pulses: 2+ and symmetric Skin: Skin color, texture, turgor normal. No rashes or lesions Neurologic: Alert and oriented X 3, normal strength and tone. Normal symmetric reflexes. Normal coordination and gait Groin site - c/d/i, minimal  renderness & minimal ecchymoses.  Lab Results: Results for orders placed during the hospital encounter of 08/27/11 (from the past 48 hour(s))  CBC     Status: Abnormal   Collection Time   08/27/11  7:43 AM      Component Value Range Comment   WBC 10.9 (*) 4.0 - 10.5 (K/uL)    RBC 4.78  3.87 - 5.11 (MIL/uL)    Hemoglobin 13.7  12.0 - 15.0 (g/dL)    HCT 41.8  36.0 - 46.0 (%)    MCV 87.4  78.0 - 100.0 (fL)    MCH 28.7  26.0 - 34.0 (pg)    MCHC 32.8  30.0 - 36.0 (g/dL)    RDW 12.2  11.5 - 15.5 (%)    Platelets 291  150 - 400 (K/uL)   POCT I-STAT TROPONIN I     Status: Normal   Collection Time   08/27/11  7:45 AM      Component Value Range Comment   Troponin i, poc 0.03  0.00 - 0.08 (ng/mL)    Comment 3            POCT I-STAT, CHEM 8     Status: Abnormal   Collection Time   08/27/11  7:47 AM      Component Value Range Comment   Sodium 140  135 - 145 (mEq/L)    Potassium 3.3 (*) 3.5 - 5.1 (mEq/L)    Chloride 105  96 - 112 (mEq/L)    BUN 15  6 - 23 (mg/dL)    Creatinine, Ser 0.70  0.50 -  1.10 (mg/dL)    Glucose, Bld 165 (*) 70 - 99 (mg/dL)    Calcium, Ion 1.06 (*) 1.12 - 1.32 (mmol/L)    TCO2 23  0 - 100 (mmol/L)    Hemoglobin 15.0  12.0 - 15.0 (g/dL)    HCT 44.0  36.0 - 46.0 (%)   CK TOTAL AND CKMB     Status: Normal   Collection Time   08/27/11  7:47 AM      Component Value Range Comment   Total CK 98  7 - 177 (U/L)    CK, MB 2.3  0.3 - 4.0 (ng/mL)    Relative Index RELATIVE INDEX IS INVALID  0.0 - 2.5    COMPREHENSIVE METABOLIC PANEL     Status: Abnormal   Collection Time   08/27/11  7:47 AM      Component Value Range Comment   Sodium 138  135 - 145 (mEq/L)    Potassium 3.2 (*) 3.5 - 5.1 (mEq/L)    Chloride 101  96 - 112 (mEq/L)    CO2 22  19 - 32 (mEq/L)    Glucose, Bld 160 (*) 70 - 99 (mg/dL)    BUN 14  6 - 23 (mg/dL)    Creatinine, Ser 0.77  0.50 - 1.10 (mg/dL)    Calcium 9.2  8.4 - 10.5 (mg/dL)    Total Protein 7.5  6.0 - 8.3 (g/dL)    Albumin 4.0  3.5 - 5.2  (g/dL)    AST 16  0 - 37 (U/L)    ALT 14  0 - 35 (U/L)    Alkaline Phosphatase 88  39 - 117 (U/L)    Total Bilirubin 0.3  0.3 - 1.2 (mg/dL)    GFR calc non Af Amer >90  >90 (mL/min)    GFR calc Af Amer >90  >90 (mL/min)   PROTIME-INR     Status: Normal   Collection Time   08/27/11  7:47 AM      Component Value Range Comment   Prothrombin Time 13.3  11.6 - 15.2 (seconds)    INR 0.99  0.00 - 1.49    APTT     Status: Normal   Collection Time   08/27/11  7:47 AM      Component Value Range Comment   aPTT 25  24 - 37 (seconds)   TROPONIN I     Status: Normal   Collection Time   08/27/11  7:47 AM      Component Value Range Comment   Troponin I <0.30  <0.30 (ng/mL)   PRO B NATRIURETIC PEPTIDE     Status: Normal   Collection Time   08/27/11  7:47 AM      Component Value Range Comment   BNP, POC 72.8  0 - 125 (pg/mL)   POCT ACTIVATED CLOTTING TIME     Status: Normal   Collection Time   08/27/11  8:24 AM      Component Value Range Comment   Activated Clotting Time 557     MRSA PCR SCREENING     Status: Normal   Collection Time   08/27/11  1:05 PM      Component Value Range Comment   MRSA by PCR NEGATIVE  NEGATIVE    CBC     Status: Abnormal   Collection Time   08/28/11  6:30 AM      Component Value Range Comment   WBC 15.9 (*) 4.0 - 10.5 (K/uL)    RBC  4.49  3.87 - 5.11 (MIL/uL)    Hemoglobin 13.3  12.0 - 15.0 (g/dL)    HCT 39.4  36.0 - 46.0 (%)    MCV 87.8  78.0 - 100.0 (fL)    MCH 29.6  26.0 - 34.0 (pg)    MCHC 33.8  30.0 - 36.0 (g/dL)    RDW 12.3  11.5 - 15.5 (%)    Platelets 254  150 - 400 (K/uL)     Imaging:  cardiac catheterization Cath:  LAD - 100% occluded proximal (Rx - Promus DES 3.5 x 67mm),  50% mid; EF - 35-40% by LV Gram w/ mid-distal anterolateral -apical & inferoapical severe HK. Echo:  Not performed  Tele:  NSR  Clinical Course: Following cath --> CCU on Amiodarone gtt.  Sheath removed without event.  Stable o/n.  MAR Reviewed  . aspirin  81 mg  Oral Daily  . nitroGLYCERIN  SL prn    . ondansetron      . pneumococcal 23 valent vaccine  0.5 mL Intramuscular Tomorrow-1000  . promethazine  12.5 mg Intravenous Once  . rosuvastatin  40 mg Oral Daily  . Ticagrelor  90 mg Oral BID   amiodarone   infusion 30 mg/hr,   IV,  Continuous  . 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  Ichemic VT - resolved, Amiodarone infusion discontinued.  No VTE prophylaxis - ambulate.  To Stepdown today.  Check CMP this AM, Replete K+ if needed   Time Spent Directly with Patient:  15 minutes  Length of Stay:  LOS: 1 day    Agripina Guyette W 08/28/2011, 7:39 AM

## 2011-08-28 NOTE — Progress Notes (Signed)
Bedrest completed @ 2100; Pt. tolerated well ; will cont. to monitor.

## 2011-08-28 NOTE — Progress Notes (Signed)
CARDIAC REHAB PHASE I   PRE:  Rate/Rhythm: 97 SR    BP: sitting 111/84    SaO2: 94 RA  MODE:  Ambulation: 250 ft   POST:  Rate/Rhythm: 112 ST    BP: sitting 146/85     SaO2: 98 RA  Pt tolerated fair.  Denied SOB but seemed slightly SOB to me.  HR up to 112 ST.  Sts her chest is sore from CPR.  Return to recliner.  Sts it felt good to be walking.  Began MI/stent ed, emphasizing smoking cessation.  Will f/u. [place]:[id number]  Darrick Meigs CES, ACSM

## 2011-08-29 LAB — CBC
MCH: 30 pg (ref 26.0–34.0)
MCV: 88.8 fL (ref 78.0–100.0)
Platelets: 221 10*3/uL (ref 150–400)
RBC: 4.54 MIL/uL (ref 3.87–5.11)

## 2011-08-29 LAB — BASIC METABOLIC PANEL
CO2: 24 mEq/L (ref 19–32)
Calcium: 9 mg/dL (ref 8.4–10.5)
Glucose, Bld: 105 mg/dL — ABNORMAL HIGH (ref 70–99)
Sodium: 139 mEq/L (ref 135–145)

## 2011-08-29 LAB — HEMOGLOBIN A1C: Mean Plasma Glucose: 108 mg/dL (ref <117)

## 2011-08-29 LAB — LIPID PANEL
LDL Cholesterol: 74 mg/dL (ref 0–99)
Total CHOL/HDL Ratio: 3.8 RATIO
VLDL: 37 mg/dL (ref 0–40)

## 2011-08-29 LAB — CARDIAC PANEL(CRET KIN+CKTOT+MB+TROPI): Total CK: 263 U/L — ABNORMAL HIGH (ref 7–177)

## 2011-08-29 MED ORDER — DOXYCYCLINE HYCLATE 100 MG PO TABS
100.0000 mg | ORAL_TABLET | Freq: Every day | ORAL | Status: DC
  Administered 2011-08-29 – 2011-08-31 (×3): 100 mg via ORAL
  Filled 2011-08-29 (×3): qty 1

## 2011-08-29 NOTE — Progress Notes (Signed)
CARDIAC REHAB PHASE I   PRE:  Rate/Rhythm: 91 SR    BP: sitting 121/75    SaO2:   MODE:  Ambulation: 350 ft   POST:  Rate/Rhythm: 96 SR    BP: sitting 143/40     SaO2:   Pt doing well, no c/o walking. Ed completed. Interested in [place] and will send referral. Motivated to quit smoking. [place], [place], ACSM

## 2011-08-29 NOTE — Progress Notes (Signed)
UR Completed.   Samantha Mcbride 08/29/2011  

## 2011-08-29 NOTE — Progress Notes (Signed)
[facility]  DAILY PROGRESS NOTE   Subjective:  Day 3 s/p acute STEMI due to proximal LAD occlusion complicated by VT arrest with prompt defibrillation and brief resuscitation in ED. Successful early reperfusion with only mild-moderate cardiac enzyme increase.  No dyspnea or recurrent angina. No further arrhythmia. LVEF 35-40% by LV angio. ECG with deep symmetrical anterior T wave inversion consistent with extensive stunning/viability.  Objective:  Temp:  [98.1 F (36.7 C)-98.9 F (37.2 C)] 98.3 F (36.8 C) (11/14 0400) Pulse Rate:  [81-92] 81  (11/14 0400) Resp:  [8-24] 16  (11/14 0400) BP: (93-139)/(60-84) 126/72 mmHg (11/14 0400) SpO2:  [92 %-97 %] 94 % (11/14 0400) Weight change:   Intake/Output from previous day: 11/13 0701 - 11/14 0700 In: 806.7 [P.O.:780; I.V.:26.7] Out: 450 [Urine:450]  Intake/Output from this shift:    Medications: Current Facility-Administered Medications  Medication Dose Route Frequency Provider Last Rate Last Dose  . acetaminophen (TYLENOL) tablet 650 mg  650 mg Oral Q4H PRN Brett Canales, PA      . acetaminophen (TYLENOL) tablet 650 mg  650 mg Oral Q4H PRN Troy Sine, MD      . acetaminophen (TYLENOL) tablet 650 mg  650 mg Oral Q4H PRN Troy Sine, MD      . antiseptic oral rinse (BIOTENE) solution 15 mL  15 mL Mouth Rinse PRN Leonie Man      . aspirin chewable tablet 81 mg  81 mg Oral Daily Troy Sine, MD   81 mg at 08/28/11 1058  . aspirin suppository 300 mg  300 mg Rectal NOW Brett Canales, PA      . carvedilol (COREG) tablet 6.25 mg  6.25 mg Oral BID WC Leonie Green Harding   6.25 mg at 08/28/11 1630  . doxycycline (DORYX) EC tablet 100 mg  100 mg Oral Daily Leonie Man   100 mg at 08/28/11 1000  . morphine 2 MG/ML injection 2 mg  2 mg Intravenous Q2H PRN Troy Sine, MD   2 mg at 08/27/11 1554  . nicotine (NICODERM CQ - dosed in mg/24 hours) patch 21 mg  21 mg Transdermal Daily Leonie Man    21 mg at 08/28/11 1234  . nitroGLYCERIN (NITROSTAT) SL tablet 0.4 mg  0.4 mg Sublingual Q5 min PRN Brett Canales, PA      . ondansetron [facility]) injection 4 mg  4 mg Intravenous Q6H PRN Brett Canales, PA      . ondansetron [facility]) injection 4 mg  4 mg Intravenous Q6H PRN Troy Sine, MD   4 mg at 08/27/11 2138  . pneumococcal 23 valent vaccine (PNU-IMMUNE) injection 0.5 mL  0.5 mL Intramuscular Tomorrow-1000 Leonie Green Harding   0.5 mL at 08/28/11 1101  . rosuvastatin (CRESTOR) tablet 40 mg  40 mg Oral Daily Troy Sine, MD   40 mg at 08/28/11 1300  . Ticagrelor (BRILINTA) tablet 90 mg  90 mg Oral BID Troy Sine, MD   90 mg at 08/28/11 2335  . zolpidem (AMBIEN) tablet 10 mg  10 mg Oral QHS PRN Troy Sine, MD      . DISCONTD: 0.9 %  sodium chloride infusion   Intravenous Continuous Troy Sine, MD 10 mL/hr at 08/28/11 0800    . DISCONTD: amiodarone (CORDARONE) 450 mg in dextrose 5 % 250 mL infusion  60 mg/hr Intravenous Continuous Troy Sine, MD 16.7 mL/hr at 08/28/11 0800  30 mg/hr at 08/28/11 0800  . DISCONTD: nitroGLYCERIN 0.2 mg/mL in dextrose 5 % infusion  2-200 mcg/min Intravenous Continuous Troy Sine, MD   10 mcg/min at 08/28/11 0600    Physical Exam: General appearance: alert and no distress Neck: no adenopathy, no carotid bruit, no JVD, supple, symmetrical, trachea midline and thyroid not enlarged, symmetric, no tenderness/mass/nodules Lungs: clear to auscultation bilaterally Heart: regular rate and rhythm, S1, S2 normal, no murmur, click, rub or gallop Abdomen: soft, non-tender; bowel sounds normal; no masses,  no organomegaly Extremities: extremities normal, atraumatic, no cyanosis or edema Pulses: 2+ and symmetric Skin: Skin color, texture, turgor normal. No rashes or lesions Neurologic: Alert and oriented X 3, normal strength and tone. Normal symmetric reflexes. Normal coordination and gait  Lab Results: Results for orders placed during the hospital  encounter of 08/27/11 (from the past 48 hour(s))  MRSA PCR SCREENING     Status: Normal   Collection Time   08/27/11  1:05 PM      Component Value Range Comment   MRSA by PCR NEGATIVE  NEGATIVE    CBC     Status: Abnormal   Collection Time   08/28/11  6:30 AM      Component Value Range Comment   WBC 15.9 (*) 4.0 - 10.5 (K/uL)    RBC 4.49  3.87 - 5.11 (MIL/uL)    Hemoglobin 13.3  12.0 - 15.0 (g/dL)    HCT 39.4  36.0 - 46.0 (%)    MCV 87.8  78.0 - 100.0 (fL)    MCH 29.6  26.0 - 34.0 (pg)    MCHC 33.8  30.0 - 36.0 (g/dL)    RDW 12.3  11.5 - 15.5 (%)    Platelets 254  150 - 400 (K/uL)   BASIC METABOLIC PANEL     Status: Abnormal   Collection Time   08/28/11  6:30 AM      Component Value Range Comment   Sodium 136  135 - 145 (mEq/L)    Potassium 3.5  3.5 - 5.1 (mEq/L)    Chloride 103  96 - 112 (mEq/L)    CO2 21  19 - 32 (mEq/L)    Glucose, Bld 123 (*) 70 - 99 (mg/dL)    BUN 8  6 - 23 (mg/dL)    Creatinine, Ser 0.65  0.50 - 1.10 (mg/dL)    Calcium 8.9  8.4 - 10.5 (mg/dL)    GFR calc non Af Amer >90  >90 (mL/min)    GFR calc Af Amer >90  >90 (mL/min)   COMPREHENSIVE METABOLIC PANEL     Status: Abnormal   Collection Time   08/28/11 10:09 AM      Component Value Range Comment   Sodium 137  135 - 145 (mEq/L)    Potassium 3.7  3.5 - 5.1 (mEq/L)    Chloride 103  96 - 112 (mEq/L)    CO2 24  19 - 32 (mEq/L)    Glucose, Bld 117 (*) 70 - 99 (mg/dL)    BUN 8  6 - 23 (mg/dL)    Creatinine, Ser 0.64  0.50 - 1.10 (mg/dL)    Calcium 9.2  8.4 - 10.5 (mg/dL)    Total Protein 7.3  6.0 - 8.3 (g/dL)    Albumin 3.7  3.5 - 5.2 (g/dL)    AST 70 (*) 0 - 37 (U/L)    ALT 38 (*) 0 - 35 (U/L)    Alkaline Phosphatase 77  39 - 117 (  U/L)    Total Bilirubin 0.5  0.3 - 1.2 (mg/dL)    GFR calc non Af Amer >90  >90 (mL/min)    GFR calc Af Amer >90  >90 (mL/min)   CARDIAC PANEL(CRET KIN+CKTOT+MB+TROPI)     Status: Abnormal   Collection Time   08/28/11 10:09 AM      Component Value Range Comment    Total CK 628 (*) 7 - 177 (U/L)    CK, MB 26.9 (*) 0.3 - 4.0 (ng/mL)    Troponin I 8.96 (*) <0.30 (ng/mL)    Relative Index 4.3 (*) 0.0 - 2.5    CARDIAC PANEL(CRET KIN+CKTOT+MB+TROPI)     Status: Abnormal   Collection Time   08/29/11  5:53 AM      Component Value Range Comment   Total CK 263 (*) 7 - 177 (U/L)    CK, MB 7.0 (*) 0.3 - 4.0 (ng/mL) CRITICAL VALUE NOTED.  VALUE IS CONSISTENT WITH PREVIOUSLY REPORTED AND CALLED VALUE.   Troponin I 5.93 (*) <0.30 (ng/mL)    Relative Index 2.7 (*) 0.0 - 2.5    BASIC METABOLIC PANEL     Status: Abnormal   Collection Time   08/29/11  5:53 AM      Component Value Range Comment   Sodium 139  135 - 145 (mEq/L)    Potassium 3.6  3.5 - 5.1 (mEq/L)    Chloride 103  96 - 112 (mEq/L)    CO2 24  19 - 32 (mEq/L)    Glucose, Bld 105 (*) 70 - 99 (mg/dL)    BUN 14  6 - 23 (mg/dL)    Creatinine, Ser 0.84  0.50 - 1.10 (mg/dL)    Calcium 9.0  8.4 - 10.5 (mg/dL)    GFR calc non Af Amer 75 (*) >90 (mL/min)    GFR calc Af Amer 86 (*) >90 (mL/min)   CBC     Status: Abnormal   Collection Time   08/29/11  5:53 AM      Component Value Range Comment   WBC 11.9 (*) 4.0 - 10.5 (K/uL)    RBC 4.54  3.87 - 5.11 (MIL/uL)    Hemoglobin 13.6  12.0 - 15.0 (g/dL)    HCT 40.3  36.0 - 46.0 (%)    MCV 88.8  78.0 - 100.0 (fL)    MCH 30.0  26.0 - 34.0 (pg)    MCHC 33.7  30.0 - 36.0 (g/dL)    RDW 12.4  11.5 - 15.5 (%)    Platelets 221  150 - 400 (K/uL)   LIPID PANEL     Status: Abnormal   Collection Time   08/29/11  5:53 AM      Component Value Range Comment   Cholesterol 150  0 - 200 (mg/dL)    Triglycerides 183 (*) <150 (mg/dL)    HDL 39 (*) >39 (mg/dL)    Total CHOL/HDL Ratio 3.8      VLDL 37  0 - 40 (mg/dL)    LDL Cholesterol 74  0 - 99 (mg/dL)     Imaging: No results found.  Assessment:  1. Principal Problem: 2.  *ST elevation myocardial infarction (STEMI) of anterior wall 3. Active Problems: 4.  Hypercholesteremia - on statin 5.  CAD (coronary artery  disease) 6.  Hypokalemia 7.  Ventricular tachycardia, sustained; resolved, off Amiodarone 8.   Plan:  1. Transfer to telemetry. 2. Add low dose ACE inh to assist LV systolic function recovery; BP may limit titration.  3. Smoking cessation reinforced - she appears committed. 4. Educated re: critical role of dual antiplatelet therapy. 5. Reevaluate LVEF by echo in 2 weeks, expect (near)complete recovery. 6. Educated re: signs and symptoms of CHF- she is still vulnerable to HF in next few weeks even if EF improves gradually 7. Potential discharge in AM with cardiac rehab enrollment.  Time Spent Directly with Patient:  30 minutes  Length of Stay:  LOS: 2 days    Samantha Mcbride 08/29/2011, 8:26 AM  Also refer to [place] note

## 2011-08-29 NOTE — Progress Notes (Signed)
Subjective:  Pt with h/o of ANT STEMI and VT arrest s/p DES to LAD, doing well last night without c/o CP  Objective:  Vital Signs in the last 24 hours: Temp:  [98.1 F (36.7 C)-98.9 F (37.2 C)] 98.3 F (36.8 C) (11/14 0400) Pulse Rate:  [81-98] 81  (11/14 0400) Resp:  [8-24] 16  (11/14 0400) BP: (93-139)/(60-91) 126/72 mmHg (11/14 0400) SpO2:  [92 %-97 %] 94 % (11/14 0400)  Intake/Output from previous day: 11/13 0701 - 11/14 0700 In: 806.7 [P.O.:780; I.V.:26.7] Out: 450 [Urine:450] Intake/Output from this shift:    Physical Exam: PE not completed  Lab Results:  [facility] 08/28/11 0630 08/27/11 0747 08/27/11 0743  WBC 15.9* -- 10.9*  HGB 13.3 15.0 --  PLT 254 -- 291    Basename 08/28/11 1009 08/28/11 0630  NA 137 136  K 3.7 3.5  CL 103 103  CO2 24 21  GLUCOSE 117* 123*  BUN 8 8  CREATININE 0.64 0.65    Basename 08/28/11 1009 08/27/11 0747  TROPONINI 8.96* <0.30   Hepatic Function Panel  Basename 08/28/11 1009  PROT 7.3  ALBUMIN 3.7  AST 70*  ALT 38*  ALKPHOS 77  BILITOT 0.5  BILIDIR --  IBILI --   No results found for this basename: CHOL in the last 72 hours No results found for this basename: PROTIME in the last 72 hours  Imaging: No results found.  Cardiac Studies:  Assessment/Plan:   H/o Ant STEMI, S/p DES to LAD  H/o of VT arrest,  ICM with reported EF 35 - 40%  HTN  HLD    LOS: 2 days    Nyisha Clippard E 08/29/2011, 5:01 AM

## 2011-08-30 ENCOUNTER — Other Ambulatory Visit: Payer: Self-pay

## 2011-08-30 DIAGNOSIS — Z720 Tobacco use: Secondary | ICD-10-CM | POA: Diagnosis present

## 2011-08-30 MED ORDER — ROSUVASTATIN CALCIUM 40 MG PO TABS: 40.0000 mg | ORAL_TABLET | Freq: Every day | ORAL | Status: DC

## 2011-08-30 MED ORDER — LISINOPRIL 2.5 MG PO TABS
2.5000 mg | ORAL_TABLET | Freq: Every day | ORAL | Status: DC
  Administered 2011-08-30 – 2011-08-31 (×2): 2.5 mg via ORAL
  Filled 2011-08-30 (×2): qty 1

## 2011-08-30 MED ORDER — TICAGRELOR 90 MG PO TABS: 90.0000 mg | ORAL_TABLET | Freq: Two times a day (BID) | ORAL | Status: DC

## 2011-08-30 MED ORDER — NICOTINE 21 MG/24HR TD PT24: 1.0000 | MEDICATED_PATCH | TRANSDERMAL | Status: AC

## 2011-08-30 MED ORDER — CARVEDILOL 6.25 MG PO TABS: 6.2500 mg | ORAL_TABLET | Freq: Two times a day (BID) | ORAL | Status: DC

## 2011-08-30 NOTE — Discharge Summary (Signed)
Physician Discharge Summary  Patient ID: Samantha Mcbride MRN: [id number] DOB/AGE: [birth year] 60 y.o.  Admit date: 08/27/2011 Discharge date: 08/30/2011  Admission Diagnoses:  Discharge Diagnoses:  Principal Problem:  *ST elevation myocardial infarction (STEMI) of anterior wall:   Proximal LAD  insertion of a 3.5x24 mm Promus element DES Active Problems:  CAD (coronary artery disease)  Hypercholesteremia - on statin  Hypokalemia: resolved  Ventricular tachycardia, sustained; resolved, off Amiodarone  Tobacco abuse-nicoderm CQ   Discharged Condition: good  Hospital Course:  The patient is a 60 year old Caucasian female with history of dyslipidemia tobacco use she presented with anterior ST elevation myocardial infarction and ventricular fibrillation requiring CPR. She was taken urgently to the cath lab a was found to  have a totaled left anterior descending artery. He subsequently received a drug-eluting stent and was started on Ticagrelor.  She also received amiodarone initially for ventricular tachycardia and ultimately just continued. Tobacco sensation was ordered and completed. Extensive discussion of diet  and exercise was completed.potassium was repleated during hospitalization.estimated ejection fraction during her catheterization was 35-40%. There is severe hypokinesis involving the distal anterior lateral lateral extending around the apex to involve the distal inferior apical segment.  Discharge on aspirin, lisinopril, Ticagrelor, Coreg, crestor.  She will be scheduled for followup with Dr. Claiborne Billings in approximately one to two weeks.  2D echo on 08/30/11 revealed EF of 40-45% with no evidence of LV thrombus.  Significant Diagnostic Studies:  Left Heart Cath(08/27/11) Angiographic data:  The left main coronary artery was a normal appearing vessel which bifurcated into the LAD and left circumflex coronary artery.  The left anterior descending artery gave rise to a very proximal septal  perforating artery and then began to taper with narrowing of 50%.  The left circumflex coronary artery was angiographically normal it gave rise to a bifurcating obtuse marginal branch.  The right coronary artery was a large caliber dominant vessel that had mild 20% proximal and distal luminal smooth narrowing. The right coronary artery and a large posterolateral coronary artery. There was no evidence for any collateralization to the LAD system via the right coronary artery.  Ventriculography revealed acute moderate left ventricular dysfunction with severe hypokinesis involving the mid distal anterolateral lateral extending around the apex to involve the distal inferoapical segment. The initial ejection fraction estimate is in the range of 35-40%. There is vigorous contractility involving the basal rales both anteriorly and posteriorly.wing percutaneous coronary intervention to the left anterior descending artery with PTCA, stenting with a 3.5x24 mm Promus element DES stent, postdilated to 3.72 mm, the 100% proximal LAD occlusion was successfully opened and reduced to 0%. The initial TIMI 0 flow improved to TIMI 3 flow. There was no evidence for any dissection.  Distal aortography reveals mild tortuosity but no significant obstructive disease. Renal arteries are patent bilaterally.    2d-Echo(08/30/11) - Left ventricle: The cavity size was normal. Systolic function was mildly to moderately reduced. The estimated ejection fraction was in the range of 40% to 45%. Moderate hypokinesis of the mid-distalanteroseptal and apical myocardium; in the distribution of the left anterior descending coronary artery. Doppler parameters are consistent with abnormal left ventricular relaxation (grade 1 diastolic dysfunction). No evidence of thrombus. - Atrial septum: No defect or patent foramen ovale was identified.     Discharge Exam: Blood pressure 115/76, pulse 84, temperature 98.4 F (36.9 C), temperature  source Oral, resp. rate 18, height 5\' 1"  (1.549 m), weight 79 kg (174 lb 2.6 oz), SpO2 94.00%. See PE  in progress note 11.15.12  Disposition: Final discharge disposition not confirmed  Discharge Orders    Future Orders Please Complete By Expires   Diet - low sodium heart healthy      Increase activity slowly      Discharge instructions      Comments:   If the catheter site or left arm become pain, swelling increases, or discharges any fluid or puss, call our office at [phone].    Driving Restrictions      Comments:   One week.     Current Discharge Medication List    START taking these medications   Details  carvedilol (COREG) 6.25 MG tablet Take 1 tablet (6.25 mg total) by mouth 2 (two) times daily with a meal. Qty: 60 tablet, Refills: 3    nicotine (NICODERM CQ - DOSED IN MG/24 HOURS) 21 mg/24hr patch Place 1 patch (21 patches total) onto the skin daily. Place 1 patch (21 mg total) onto the skin daily. Qty: 28 patch, Refills: 1    rosuvastatin (CRESTOR) 40 MG tablet Take 1 tablet (40 mg total) by mouth daily. Qty: 30 tablet, Refills: 3    Ticagrelor (BRILINTA) 90 MG TABS tablet Take 1 tablet (90 mg total) by mouth 2 (two) times daily. Qty: 60 tablet, Refills: 11    Lisinopril, 2.5mg , One tablet daily  CONTINUE these medications which have NOT CHANGED   Details  aspirin 81 MG tablet Take 81 mg by mouth daily.      doxycycline (DORYX) 100 MG EC tablet Take 50 mg by mouth daily.      traZODone (DESYREL) 100 MG tablet Take by mouth at bedtime. Dose unknown       STOP taking these medications     atorvastatin (LIPITOR) 10 MG tablet          Signed: Arlen Legendre W 08/30/2011, 11:57 AM

## 2011-08-30 NOTE — Progress Notes (Signed)
  Echocardiogram 2D Echocardiogram has been performed.  [place], RDCS 08/30/2011, 3:37 PM

## 2011-08-30 NOTE — Progress Notes (Signed)
Pt admitted with stemi. Plan for d/c on Brilinta. CM will provide pt with 30 day free brilinta card and co pay card. MD please write for 30 day free brilinta no refills and original rx with refills.  CM will call [facility] off [address] and they do carry medication. CM will make Pt aware of location availiability. CM will call PA to see if he can write rx for 30 day supply free brilinta and pt may d/c in am- plan for echo today. No other needs assessed by CM. Bethena Roys, CM 08/30/2011 2:05 PM

## 2011-08-30 NOTE — Progress Notes (Signed)
[facility]  Subjective: No complaints.  Objective: Vital signs in last 24 hours: Temp:  [97.6 F (36.4 C)-98.5 F (36.9 C)] 98.4 F (36.9 C) (11/15 0415) Pulse Rate:  [84-104] 84  (11/15 0415) Resp:  [16-18] 18  (11/15 0415) BP: (103-126)/(48-82) 115/76 mmHg (11/15 0415) SpO2:  [92 %-96 %] 94 % (11/15 0415) Last BM Date: 08/27/11  Intake/Output from previous day: 11/14 0701 - 11/15 0700 In: 880 [P.O.:880] Out: -  Intake/Output this shift:    Medications Current Facility-Administered Medications  Medication Dose Route Frequency Provider Last Rate Last Dose  . acetaminophen (TYLENOL) tablet 650 mg  650 mg Oral Q4H PRN Brett Canales, PA      . acetaminophen (TYLENOL) tablet 650 mg  650 mg Oral Q4H PRN Troy Sine, MD      . acetaminophen (TYLENOL) tablet 650 mg  650 mg Oral Q4H PRN Troy Sine, MD      . antiseptic oral rinse (BIOTENE) solution 15 mL  15 mL Mouth Rinse PRN Leonie Man      . aspirin chewable tablet 81 mg  81 mg Oral Daily Troy Sine, MD   81 mg at 08/29/11 1159  . carvedilol (COREG) tablet 6.25 mg  6.25 mg Oral BID WC Leonie Green Harding   6.25 mg at 08/29/11 [id number]  . doxycycline (VIBRA-TABS) tablet 100 mg  100 mg Oral Daily Leonie Man   100 mg at 08/29/11 1151  . morphine 2 MG/ML injection 2 mg  2 mg Intravenous Q2H PRN Troy Sine, MD   2 mg at 08/27/11 1554  . nicotine (NICODERM CQ - dosed in mg/24 hours) patch 21 mg  21 mg Transdermal Daily Leonie Man   21 mg at 08/29/11 1151  . nitroGLYCERIN (NITROSTAT) SL tablet 0.4 mg  0.4 mg Sublingual Q5 min PRN Brett Canales, PA      . ondansetron [facility]) injection 4 mg  4 mg Intravenous Q6H PRN Brett Canales, PA      . ondansetron [facility]) injection 4 mg  4 mg Intravenous Q6H PRN Troy Sine, MD   4 mg at 08/27/11 2138  . rosuvastatin (CRESTOR) tablet 40 mg  40 mg Oral Daily Troy Sine, MD   40 mg at 08/28/11 1300  . Ticagrelor (BRILINTA) tablet 90 mg  90  mg Oral BID Troy Sine, MD   90 mg at 08/29/11 2300  . zolpidem (AMBIEN) tablet 10 mg  10 mg Oral QHS PRN Troy Sine, MD   10 mg at 08/29/11 2322    PE: General appearance: alert, cooperative and no distress Lungs: clear to auscultation bilaterally Heart: regular rate and rhythm, S1, S2 normal, no murmur, click, rub or gallop Extremities: no edema  Lab Results:   Basename 08/29/11 0553 08/28/11 0630  WBC 11.9* 15.9*  HGB 13.6 13.3  HCT 40.3 39.4  PLT 221 254   BMET  Basename 08/29/11 0553 08/28/11 1009 08/28/11 0630  NA 139 137 136  K 3.6 3.7 3.5  CL 103 103 103  CO2 24 24 21   GLUCOSE 105* 117* 123*  BUN 14 8 8   CREATININE 0.84 0.64 0.65  CALCIUM 9.0 9.2 8.9   PT/INR No results found for this basename: LABPROT:3,INR:3 in the last 72 hours  Studies/Results:    Assessment/Plan  Principal Problem:   *ST elevation myocardial infarction (STEMI) of anterior wall:   Proximal LAD  insertion of a  3.5x24 mm     Promus element DES  Active Problems:   CAD (coronary artery disease)  Tobacco abuse  Hypercholesteremia - on statin  Hypokalemia:  Resolved  Ventricular tachycardia, sustained; resolved, off Amiodarone  Plan:  ASA, Coreg, Tigcagrelor, Crestor.  Doing well.  Tobacco cessation provided.  Discussed diet and  exercise extensively.  DC homes today.  Her sister is coming to stay with her for awhile.   LOS: 3 days   Time spent: 26mins  HAGER,BRYAN W 08/30/2011 9:08 AM  Patient seen and examined. Agree with assessment and plan.  Pt is day 3 s/p anterior STEMI treated with PCI/stent to proximal LAD occlusion.  Now on dual antiplatelet RX, B-Blocker.  Will add ACE-I initially with lisinipril 2.5 mg and titrate upwards as BP allows.  With large area of anterior wall motion abnormality will obtain 2d-echo today to make certain no LV thrombus and need for coumadin.  Repeat ecg today. Walburga Hudman A 08/30/2011 12:45 PM

## 2011-08-31 MED ORDER — NITROGLYCERIN 0.4 MG SL SUBL: 0.4000 mg | SUBLINGUAL_TABLET | SUBLINGUAL | Status: DC | PRN

## 2011-08-31 MED ORDER — LISINOPRIL 2.5 MG PO TABS: 2.5000 mg | ORAL_TABLET | Freq: Every day | ORAL | Status: DC

## 2011-08-31 MED ORDER — TICAGRELOR 90 MG PO TABS: 90.0000 mg | ORAL_TABLET | Freq: Two times a day (BID) | ORAL | Status: DC

## 2011-08-31 MED FILL — White Petrolatum Gel: Qty: 5 | Status: AC

## 2011-08-31 NOTE — Progress Notes (Signed)
[facility]  Subjective: Feels fine.  Objective: Vital signs in last 24 hours: Temp:  [97.9 F (36.6 C)-98.7 F (37.1 C)] 98.7 F (37.1 C) (11/16 0427) Pulse Rate:  [79-88] 79  (11/16 0427) Resp:  [20] 20  (11/16 0427) BP: (106-137)/(74-85) 137/80 mmHg (11/16 0427) SpO2:  [95 %-96 %] 96 % (11/16 0427) Weight:  [76.204 kg (168 lb)] 168 lb (76.204 kg) (11/16 0427) Last BM Date: 08/27/11  Intake/Output from previous day: 11/15 0701 - 11/16 0700 In: 1200 [P.O.:1200] Out: -  Intake/Output this shift:    Medications Current Facility-Administered Medications  Medication Dose Route Frequency Provider Last Rate Last Dose  . acetaminophen (TYLENOL) tablet 650 mg  650 mg Oral Q4H PRN Brett Canales, PA      . acetaminophen (TYLENOL) tablet 650 mg  650 mg Oral Q4H PRN Troy Sine, MD      . acetaminophen (TYLENOL) tablet 650 mg  650 mg Oral Q4H PRN Troy Sine, MD      . antiseptic oral rinse (BIOTENE) solution 15 mL  15 mL Mouth Rinse PRN Leonie Man      . aspirin chewable tablet 81 mg  81 mg Oral Daily Troy Sine, MD   81 mg at 08/30/11 1000  . carvedilol (COREG) tablet 6.25 mg  6.25 mg Oral BID WC Leonie Green Harding   6.25 mg at 08/31/11 [id number]  . doxycycline (VIBRA-TABS) tablet 100 mg  100 mg Oral Daily Leonie Man   100 mg at 08/30/11 0944  . lisinopril (PRINIVIL,ZESTRIL) tablet 2.5 mg  2.5 mg Oral Daily Troy Sine, MD   2.5 mg at 08/30/11 1532  . morphine 2 MG/ML injection 2 mg  2 mg Intravenous Q2H PRN Troy Sine, MD   2 mg at 08/27/11 1554  . nicotine (NICODERM CQ - dosed in mg/24 hours) patch 21 mg  21 mg Transdermal Daily Leonie Man   21 mg at 08/30/11 1000  . nitroGLYCERIN (NITROSTAT) SL tablet 0.4 mg  0.4 mg Sublingual Q5 min PRN Brett Canales, PA      . ondansetron [facility]) injection 4 mg  4 mg Intravenous Q6H PRN Brett Canales, PA      . ondansetron [facility]) injection 4 mg  4 mg Intravenous Q6H PRN Troy Sine,  MD   4 mg at 08/27/11 2138  . rosuvastatin (CRESTOR) tablet 40 mg  40 mg Oral Daily Troy Sine, MD   40 mg at 08/30/11 1138  . Ticagrelor (BRILINTA) tablet 90 mg  90 mg Oral BID Troy Sine, MD   90 mg at 08/30/11 2313  . zolpidem (AMBIEN) tablet 10 mg  10 mg Oral QHS PRN Troy Sine, MD   10 mg at 08/30/11 2311    PE: General appearance: alert, cooperative and no distress Lungs: clear to auscultation bilaterally Heart: regular rate and rhythm Extremities: no edema  Lab Results:   [facility] 08/29/11 0553  WBC 11.9*  HGB 13.6  HCT 40.3  PLT 221   BMET  Basename 08/29/11 0553 08/28/11 1009  NA 139 137  K 3.6 3.7  CL 103 103  CO2 24 24  GLUCOSE 105* 117*  BUN 14 8  CREATININE 0.84 0.64  CALCIUM 9.0 9.2   PT/INR No results found for this basename: LABPROT:3,INR:3 in the last 72 hours Cholesterol  Basename 08/29/11 0553  CHOL 150   Cardiac Enzymes No components found with  this basename: TROPONIN:3, CKMB:3  Studies/Results: 2D-echo(08/30/11) Left ventricle: The cavity size was normal. Systolic function was mildly to moderately reduced. The estimated ejection fraction was in the range of 40% to 45%. Moderate hypokinesis of the mid-distalanteroseptal and apical myocardium; in the distribution of the left anterior descending coronary artery. Doppler parameters are consistent with abnormal left ventricular relaxation (grade 1 diastolic dysfunction). No evidence of thrombus. - Atrial septum: No defect or patent foramen ovale was identified.  Assessment/Plan  Principal Problem:  *ST elevation myocardial infarction (STEMI) of anterior wall:   Proximal LAD  insertion of a 3.5x24 mm Promus element DES Active Problems:  CAD (coronary artery disease)  Hypercholesteremia - on statin  Hypokalemia  Ventricular tachycardia, sustained; resolved, off Amiodarone  Tobacco abuse  Plan:   LOS: 4 days    HAGER,BRYAN W 08/31/2011 9:39 AM  Agree with note written  by Luisa Dago PAC Ay # 5 Ant STEMI treated with DES by Dr. Leda Gauze. LV function improved by 2D echo. On appropriate meds. OK for DC home. ROV with TK in 1-2 wks. OP cardiac rehad. Emphasized smoking cessation.  Lorretta Harp 08/31/2011 9:43 AM

## 2011-08-31 NOTE — Progress Notes (Signed)
Discharge instructions, follow up appointments, and medications reviewed with patient and family.  No questions or concerns.  Discharged via volunteer services. Renee Pain

## 2011-09-17 ENCOUNTER — Ambulatory Visit (HOSPITAL_COMMUNITY): Payer: No Typology Code available for payment source

## 2011-09-17 ENCOUNTER — Encounter (HOSPITAL_COMMUNITY)
Admission: RE | Admit: 2011-09-17 | Discharge: 2011-09-17 | Disposition: A | Discharge status: Home / Self Care | Payer: No Typology Code available for payment source | Source: Ambulatory Visit | Attending: Cardiology | Admitting: Cardiology

## 2011-09-17 DIAGNOSIS — I4901 Ventricular fibrillation: Secondary | ICD-10-CM | POA: Insufficient documentation

## 2011-09-17 DIAGNOSIS — Z8674 Personal history of sudden cardiac arrest: Secondary | ICD-10-CM | POA: Insufficient documentation

## 2011-09-17 DIAGNOSIS — F17200 Nicotine dependence, unspecified, uncomplicated: Secondary | ICD-10-CM | POA: Insufficient documentation

## 2011-09-17 DIAGNOSIS — I2109 ST elevation (STEMI) myocardial infarction involving other coronary artery of anterior wall: Secondary | ICD-10-CM | POA: Insufficient documentation

## 2011-09-17 DIAGNOSIS — I10 Essential (primary) hypertension: Secondary | ICD-10-CM | POA: Insufficient documentation

## 2011-09-17 DIAGNOSIS — I498 Other specified cardiac arrhythmias: Secondary | ICD-10-CM | POA: Insufficient documentation

## 2011-09-17 DIAGNOSIS — E7800 Pure hypercholesterolemia, unspecified: Secondary | ICD-10-CM | POA: Insufficient documentation

## 2011-09-17 DIAGNOSIS — Z5189 Encounter for other specified aftercare: Secondary | ICD-10-CM | POA: Insufficient documentation

## 2011-09-17 DIAGNOSIS — Z9861 Coronary angioplasty status: Secondary | ICD-10-CM | POA: Insufficient documentation

## 2011-09-17 DIAGNOSIS — E876 Hypokalemia: Secondary | ICD-10-CM | POA: Insufficient documentation

## 2011-09-17 NOTE — Progress Notes (Signed)
Pt started cardiac rehab today.  Pt tolerated light exercise without difficulty. Telemetry Sinus rhythm with T wave inversion present in lead II. Samantha Mcbride tolerated light exercise without difficulty.  ECG tracings faxed to Dr Allison Quarry office for review.  Dr Gwenlyn Found reviewed ECG's.  Dr Gwenlyn Found said T wave inversion is present in v 4 though v6.   T wave inversion in lead II is okay. Pt left cardiac rehab without complaints. Will continue to monitor the patient throughout  the program.

## 2011-09-19 ENCOUNTER — Ambulatory Visit (HOSPITAL_COMMUNITY): Payer: No Typology Code available for payment source

## 2011-09-19 ENCOUNTER — Encounter (HOSPITAL_COMMUNITY)
Admission: RE | Admit: 2011-09-19 | Discharge: 2011-09-19 | Disposition: A | Discharge status: Home / Self Care | Payer: No Typology Code available for payment source | Source: Ambulatory Visit | Attending: Cardiology | Admitting: Cardiology

## 2011-09-20 ENCOUNTER — Other Ambulatory Visit (HOSPITAL_COMMUNITY): Payer: Self-pay | Admitting: Obstetrics and Gynecology

## 2011-09-20 DIAGNOSIS — Z1231 Encounter for screening mammogram for malignant neoplasm of breast: Secondary | ICD-10-CM

## 2011-09-21 ENCOUNTER — Encounter (HOSPITAL_COMMUNITY)
Admission: RE | Admit: 2011-09-21 | Discharge: 2011-09-21 | Disposition: A | Discharge status: Home / Self Care | Payer: No Typology Code available for payment source | Source: Ambulatory Visit | Attending: Cardiology | Admitting: Cardiology

## 2011-09-21 ENCOUNTER — Ambulatory Visit (HOSPITAL_COMMUNITY): Payer: No Typology Code available for payment source

## 2011-09-24 ENCOUNTER — Ambulatory Visit (HOSPITAL_COMMUNITY): Payer: No Typology Code available for payment source

## 2011-09-24 ENCOUNTER — Encounter (HOSPITAL_COMMUNITY)
Admission: RE | Admit: 2011-09-24 | Discharge: 2011-09-24 | Disposition: A | Discharge status: Home / Self Care | Payer: No Typology Code available for payment source | Source: Ambulatory Visit | Attending: Cardiology | Admitting: Cardiology

## 2011-09-24 NOTE — Progress Notes (Signed)
Reviewed home exercise with pt today.  Pt plans to walk and possibly join [employer] for exercise.  Reviewed THR, pulse, RPE, sign and symptoms, NTG use, and when to call 911 or MD.  Pt voiced understanding.]

## 2011-09-26 ENCOUNTER — Ambulatory Visit (HOSPITAL_COMMUNITY): Payer: No Typology Code available for payment source

## 2011-09-26 ENCOUNTER — Encounter (HOSPITAL_COMMUNITY)
Admission: RE | Admit: 2011-09-26 | Discharge: 2011-09-26 | Disposition: A | Discharge status: Home / Self Care | Payer: No Typology Code available for payment source | Source: Ambulatory Visit | Attending: Cardiology | Admitting: Cardiology

## 2011-09-28 ENCOUNTER — Encounter (HOSPITAL_COMMUNITY)
Admission: RE | Admit: 2011-09-28 | Discharge: 2011-09-28 | Disposition: A | Discharge status: Home / Self Care | Payer: No Typology Code available for payment source | Source: Ambulatory Visit | Attending: Cardiology | Admitting: Cardiology

## 2011-09-28 ENCOUNTER — Ambulatory Visit (HOSPITAL_COMMUNITY): Payer: No Typology Code available for payment source

## 2011-10-01 ENCOUNTER — Ambulatory Visit (HOSPITAL_COMMUNITY): Payer: No Typology Code available for payment source

## 2011-10-01 ENCOUNTER — Encounter (HOSPITAL_COMMUNITY): Payer: No Typology Code available for payment source

## 2011-10-03 ENCOUNTER — Encounter (HOSPITAL_COMMUNITY)
Admission: RE | Admit: 2011-10-03 | Discharge: 2011-10-03 | Disposition: A | Discharge status: Home / Self Care | Payer: No Typology Code available for payment source | Source: Ambulatory Visit | Attending: Cardiology | Admitting: Cardiology

## 2011-10-03 ENCOUNTER — Ambulatory Visit (HOSPITAL_COMMUNITY): Payer: No Typology Code available for payment source

## 2011-10-04 NOTE — Progress Notes (Signed)
Vee

## 2011-10-05 ENCOUNTER — Encounter (HOSPITAL_COMMUNITY)
Admission: RE | Admit: 2011-10-05 | Discharge: 2011-10-05 | Disposition: A | Discharge status: Home / Self Care | Payer: No Typology Code available for payment source | Source: Ambulatory Visit | Attending: Cardiology | Admitting: Cardiology

## 2011-10-05 ENCOUNTER — Ambulatory Visit (HOSPITAL_COMMUNITY): Payer: No Typology Code available for payment source

## 2011-10-08 ENCOUNTER — Ambulatory Visit (HOSPITAL_COMMUNITY): Payer: No Typology Code available for payment source

## 2011-10-08 ENCOUNTER — Encounter (HOSPITAL_COMMUNITY): Payer: No Typology Code available for payment source

## 2011-10-10 ENCOUNTER — Ambulatory Visit (HOSPITAL_COMMUNITY): Payer: No Typology Code available for payment source

## 2011-10-10 ENCOUNTER — Encounter (HOSPITAL_COMMUNITY): Payer: No Typology Code available for payment source

## 2011-10-12 ENCOUNTER — Encounter (HOSPITAL_COMMUNITY): Payer: No Typology Code available for payment source

## 2011-10-12 ENCOUNTER — Ambulatory Visit (HOSPITAL_COMMUNITY): Payer: No Typology Code available for payment source

## 2011-10-15 ENCOUNTER — Ambulatory Visit (HOSPITAL_COMMUNITY): Payer: No Typology Code available for payment source

## 2011-10-15 ENCOUNTER — Encounter (HOSPITAL_COMMUNITY): Payer: No Typology Code available for payment source

## 2011-10-17 ENCOUNTER — Encounter (HOSPITAL_COMMUNITY): Payer: No Typology Code available for payment source

## 2011-10-17 ENCOUNTER — Ambulatory Visit (HOSPITAL_COMMUNITY): Payer: No Typology Code available for payment source

## 2011-10-19 ENCOUNTER — Ambulatory Visit (HOSPITAL_COMMUNITY): Payer: No Typology Code available for payment source

## 2011-10-19 ENCOUNTER — Encounter (HOSPITAL_COMMUNITY)
Admission: RE | Admit: 2011-10-19 | Discharge: 2011-10-19 | Disposition: A | Discharge status: Home / Self Care | Payer: No Typology Code available for payment source | Source: Ambulatory Visit | Attending: Cardiology | Admitting: Cardiology

## 2011-10-19 DIAGNOSIS — E876 Hypokalemia: Secondary | ICD-10-CM | POA: Insufficient documentation

## 2011-10-19 DIAGNOSIS — F17200 Nicotine dependence, unspecified, uncomplicated: Secondary | ICD-10-CM | POA: Insufficient documentation

## 2011-10-19 DIAGNOSIS — I10 Essential (primary) hypertension: Secondary | ICD-10-CM | POA: Insufficient documentation

## 2011-10-19 DIAGNOSIS — Z9861 Coronary angioplasty status: Secondary | ICD-10-CM | POA: Insufficient documentation

## 2011-10-19 DIAGNOSIS — Z8674 Personal history of sudden cardiac arrest: Secondary | ICD-10-CM | POA: Insufficient documentation

## 2011-10-19 DIAGNOSIS — Z5189 Encounter for other specified aftercare: Secondary | ICD-10-CM | POA: Insufficient documentation

## 2011-10-19 DIAGNOSIS — I498 Other specified cardiac arrhythmias: Secondary | ICD-10-CM | POA: Insufficient documentation

## 2011-10-19 DIAGNOSIS — I2109 ST elevation (STEMI) myocardial infarction involving other coronary artery of anterior wall: Secondary | ICD-10-CM | POA: Insufficient documentation

## 2011-10-19 DIAGNOSIS — I4901 Ventricular fibrillation: Secondary | ICD-10-CM | POA: Insufficient documentation

## 2011-10-19 DIAGNOSIS — E7800 Pure hypercholesterolemia, unspecified: Secondary | ICD-10-CM | POA: Insufficient documentation

## 2011-10-22 ENCOUNTER — Ambulatory Visit (HOSPITAL_COMMUNITY): Payer: No Typology Code available for payment source

## 2011-10-22 ENCOUNTER — Encounter (HOSPITAL_COMMUNITY): Payer: No Typology Code available for payment source

## 2011-10-24 ENCOUNTER — Ambulatory Visit (HOSPITAL_COMMUNITY): Payer: No Typology Code available for payment source

## 2011-10-24 ENCOUNTER — Encounter (HOSPITAL_COMMUNITY)
Admission: RE | Admit: 2011-10-24 | Discharge: 2011-10-24 | Payer: No Typology Code available for payment source | Source: Ambulatory Visit | Attending: Cardiology | Admitting: Cardiology

## 2011-10-24 NOTE — Progress Notes (Signed)
Samantha Mcbride says she has bursitis in her right shoulder. Samantha Mcbride was prescribed percocet for pain.  Samantha Mcbride said she took a half of a percocet before coming to cardiac rehab.  Advised patient not to exercise today while taking narcotics.  Patient states understanding.

## 2011-10-26 ENCOUNTER — Ambulatory Visit (HOSPITAL_COMMUNITY)
Admission: RE | Admit: 2011-10-26 | Discharge: 2011-10-26 | Disposition: A | Discharge status: Home / Self Care | Payer: No Typology Code available for payment source | Source: Ambulatory Visit | Attending: Obstetrics and Gynecology | Admitting: Obstetrics and Gynecology

## 2011-10-26 ENCOUNTER — Encounter (HOSPITAL_COMMUNITY): Payer: No Typology Code available for payment source

## 2011-10-26 ENCOUNTER — Ambulatory Visit (HOSPITAL_COMMUNITY): Payer: No Typology Code available for payment source

## 2011-10-26 DIAGNOSIS — Z1231 Encounter for screening mammogram for malignant neoplasm of breast: Secondary | ICD-10-CM | POA: Insufficient documentation

## 2011-10-29 ENCOUNTER — Encounter (HOSPITAL_COMMUNITY)
Admission: RE | Admit: 2011-10-29 | Discharge: 2011-10-29 | Disposition: A | Discharge status: Home / Self Care | Payer: No Typology Code available for payment source | Source: Ambulatory Visit | Attending: Cardiology | Admitting: Cardiology

## 2011-10-29 ENCOUNTER — Ambulatory Visit (HOSPITAL_COMMUNITY): Payer: No Typology Code available for payment source

## 2011-10-29 NOTE — Progress Notes (Signed)
LISBET MCCADDEN 61 y.o. female Nutrition Note  Spoke with pt.  Nutrition Plan and Nutrition Survey reviewed with pt. Pt following a step 2 heart healthy diet.  Weight loss tips discussed.    Nutrition Diagnosis   Food-and nutrition-related knowledge deficit related to lack of exposure to information as related to diagnosis of: ? CVD  Obesity related to excessive energy intake as evidenced by a BMI of 30.8 Nutrition RX/ Estimated Daily Nutrition Needs for: wt loss 1200-1450 Kcal, 30-40 gm fat, 9-11 gm sat fat, 1.2-1.5 gm trans-fat, <1500 mg sodium Nutrition Intervention   Pt's individual nutrition plan including cholesterol goals reviewed with pt.   Benefits of adopting Therapeutic Lifestyle Changes discussed when Medficts reviewed.   Pt to attend the Portion Distortion class   Pt to attend the ? Nutrition I class                         ? Nutrition II class    Pt given handouts for: ? wt loss ? Nutrition II: Lifestyle Skills Class   Continue client-centered nutrition education by RD, as part of interdisciplinary care. Goal(s)   Pt to identify food quantities necessary to achieve: ? wt loss to a goal wt of 144-156 lb (65.5-70.9 kg) at graduation from cardiac rehab.  Monitor and Evaluate progress toward nutrition goal with team.

## 2011-10-31 ENCOUNTER — Encounter (HOSPITAL_COMMUNITY): Payer: No Typology Code available for payment source

## 2011-10-31 ENCOUNTER — Ambulatory Visit (HOSPITAL_COMMUNITY): Payer: No Typology Code available for payment source

## 2011-11-02 ENCOUNTER — Encounter (HOSPITAL_COMMUNITY)
Admission: RE | Admit: 2011-11-02 | Discharge: 2011-11-02 | Disposition: A | Discharge status: Home / Self Care | Payer: No Typology Code available for payment source | Source: Ambulatory Visit | Attending: Cardiology | Admitting: Cardiology

## 2011-11-02 ENCOUNTER — Ambulatory Visit (HOSPITAL_COMMUNITY): Payer: No Typology Code available for payment source

## 2011-11-05 ENCOUNTER — Ambulatory Visit (HOSPITAL_COMMUNITY): Payer: No Typology Code available for payment source

## 2011-11-05 ENCOUNTER — Encounter (HOSPITAL_COMMUNITY)
Admission: RE | Admit: 2011-11-05 | Discharge: 2011-11-05 | Disposition: A | Discharge status: Home / Self Care | Payer: No Typology Code available for payment source | Source: Ambulatory Visit | Attending: Cardiology | Admitting: Cardiology

## 2011-11-07 ENCOUNTER — Ambulatory Visit (HOSPITAL_COMMUNITY): Payer: No Typology Code available for payment source

## 2011-11-07 ENCOUNTER — Encounter (HOSPITAL_COMMUNITY): Payer: No Typology Code available for payment source

## 2011-11-09 ENCOUNTER — Ambulatory Visit (HOSPITAL_COMMUNITY): Payer: No Typology Code available for payment source

## 2011-11-09 ENCOUNTER — Encounter (HOSPITAL_COMMUNITY): Payer: No Typology Code available for payment source

## 2011-11-12 ENCOUNTER — Ambulatory Visit (HOSPITAL_COMMUNITY): Payer: No Typology Code available for payment source

## 2011-11-12 ENCOUNTER — Encounter (HOSPITAL_COMMUNITY)
Admission: RE | Admit: 2011-11-12 | Discharge: 2011-11-12 | Disposition: A | Discharge status: Home / Self Care | Payer: No Typology Code available for payment source | Source: Ambulatory Visit | Attending: Cardiology | Admitting: Cardiology

## 2011-11-13 NOTE — Progress Notes (Addendum)
Samantha Mcbride graduates from cardiac rehab today due to insurance and return to work.  Samantha Mcbride plans to continue exercise on her own.  Samantha Mcbride has abstained from smoking.  Samantha Mcbride is using a Nicoderm patch.

## 2011-11-14 ENCOUNTER — Ambulatory Visit (HOSPITAL_COMMUNITY): Payer: No Typology Code available for payment source

## 2011-11-14 ENCOUNTER — Encounter (HOSPITAL_COMMUNITY): Payer: No Typology Code available for payment source

## 2011-11-16 ENCOUNTER — Ambulatory Visit (HOSPITAL_COMMUNITY): Payer: No Typology Code available for payment source

## 2011-11-16 ENCOUNTER — Encounter (HOSPITAL_COMMUNITY): Payer: No Typology Code available for payment source

## 2011-11-19 ENCOUNTER — Encounter (HOSPITAL_COMMUNITY): Payer: No Typology Code available for payment source

## 2011-11-19 ENCOUNTER — Ambulatory Visit (HOSPITAL_COMMUNITY): Payer: No Typology Code available for payment source

## 2011-11-21 ENCOUNTER — Ambulatory Visit (HOSPITAL_COMMUNITY): Payer: No Typology Code available for payment source

## 2011-11-21 ENCOUNTER — Encounter (HOSPITAL_COMMUNITY): Payer: No Typology Code available for payment source

## 2011-11-23 ENCOUNTER — Encounter (HOSPITAL_COMMUNITY): Payer: No Typology Code available for payment source

## 2011-11-23 ENCOUNTER — Ambulatory Visit (HOSPITAL_COMMUNITY): Payer: No Typology Code available for payment source

## 2011-11-23 NOTE — Progress Notes (Addendum)
Cardiac Rehabilitation Program Progress Report   Orientation:  09/13/2011 Graduate Date:  11/11/2010  # of sessions completed: 13/36  Cardiologist: Benita Stabile MD:  Santo Held Time:  [id number]  A.  Exercise Program:  Tolerates exercise @ 2.6 METS for 30 minutes, Bike Test Results:  Pre: 0.72 miles and Post: 0.94 miles, Improved functional capacity  30.6 %, Improved  muscular strength  7.3 %, Improved  flexibility 39.3 %, Improved education score 4 % and Discharged to home exercise program.  Anticipated compliance:  fair  B.  Mental Health:  Good mental attitude and Quality of Life (QOL)  changes:  Overall  1.0 %, Health/Functioning 1.8 %, Socioeconomics 2.1 %, Psych/Spiritual 2.7 %, Family -5.3 %    C.  Education/Instruction/Skills  Accurately checks own pulse.  Rest:  81  Exercise:  129, Knows THR for exercise, Uses Perceived Exertion Scale and/or Dyspnea Scale and Attended 6/13 education classes  Home exercise given: 09/24/2011  D.  Nutrition/Weight Control/Body Composition:  Pt following a step 2 heart healthy diet, BMI 30.5 % Body Fat  42.9 and Patient has lost 0.7 kg  *This section completed by Derek Mound, Reed Pandy, RD, LDN, CDE  E.  Blood Lipids Lipid Panel     Component Value Date/Time   CHOL 150 08/29/2011 0553   TRIG 183* 08/29/2011 0553   HDL 39* 08/29/2011 0553   CHOLHDL 3.8 08/29/2011 0553   VLDL 37 08/29/2011 0553   LDLCALC 74 08/29/2011 0553   F.  Lifestyle Changes:  Making positive lifestyle changes  G.  Symptoms noted with exercise:  Asymptomatic  Report Completed By:  Clotilde Dieter   Comments:  Pt graduated early from program due to high insurance co-pays.  Pt did fair while in program.  She made no progression in her METs Level, but did maintain 2.6 METs average.  She plans to continue to exercise by walking and attending the [employer].  At d/c pt rhythm was sinus.  Thanks for the referral. Alberteen Sam, MA, ACSM  RCEP

## 2011-11-26 ENCOUNTER — Ambulatory Visit (HOSPITAL_COMMUNITY): Payer: No Typology Code available for payment source

## 2011-11-28 ENCOUNTER — Ambulatory Visit (HOSPITAL_COMMUNITY): Payer: No Typology Code available for payment source

## 2011-11-30 ENCOUNTER — Ambulatory Visit (HOSPITAL_COMMUNITY): Payer: No Typology Code available for payment source

## 2011-12-03 ENCOUNTER — Ambulatory Visit (HOSPITAL_COMMUNITY): Payer: No Typology Code available for payment source

## 2011-12-05 ENCOUNTER — Ambulatory Visit (HOSPITAL_COMMUNITY): Payer: No Typology Code available for payment source

## 2011-12-07 ENCOUNTER — Ambulatory Visit (HOSPITAL_COMMUNITY): Payer: No Typology Code available for payment source

## 2011-12-10 ENCOUNTER — Ambulatory Visit (HOSPITAL_COMMUNITY): Payer: No Typology Code available for payment source

## 2011-12-12 ENCOUNTER — Ambulatory Visit (HOSPITAL_COMMUNITY): Payer: No Typology Code available for payment source

## 2011-12-14 ENCOUNTER — Ambulatory Visit (HOSPITAL_COMMUNITY): Payer: No Typology Code available for payment source

## 2011-12-17 ENCOUNTER — Ambulatory Visit (HOSPITAL_COMMUNITY): Payer: No Typology Code available for payment source

## 2011-12-19 ENCOUNTER — Ambulatory Visit (HOSPITAL_COMMUNITY): Payer: No Typology Code available for payment source

## 2011-12-21 ENCOUNTER — Ambulatory Visit (HOSPITAL_COMMUNITY): Payer: No Typology Code available for payment source

## 2012-01-08 ENCOUNTER — Encounter (HOSPITAL_COMMUNITY): Payer: Self-pay | Admitting: *Deleted

## 2012-01-08 NOTE — Progress Notes (Signed)
Agree with progress report from cardiac rehab on 11/23/2011 written by Myrla Halsted and and Frances Furbish.

## 2012-04-15 ENCOUNTER — Other Ambulatory Visit: Payer: Self-pay | Admitting: Family Medicine

## 2012-04-15 DIAGNOSIS — R9389 Abnormal findings on diagnostic imaging of other specified body structures: Secondary | ICD-10-CM

## 2012-04-25 ENCOUNTER — Ambulatory Visit
Admission: RE | Admit: 2012-04-25 | Discharge: 2012-04-25 | Disposition: A | Discharge status: Home / Self Care | Payer: No Typology Code available for payment source | Source: Ambulatory Visit | Attending: Family Medicine | Admitting: Family Medicine

## 2012-04-25 DIAGNOSIS — R9389 Abnormal findings on diagnostic imaging of other specified body structures: Secondary | ICD-10-CM

## 2012-04-25 MED ORDER — IOHEXOL 300 MG/ML  SOLN: 75.0000 mL | Freq: Once | INTRAMUSCULAR | Status: AC | PRN

## 2012-04-30 ENCOUNTER — Other Ambulatory Visit: Payer: Self-pay | Admitting: Family Medicine

## 2012-04-30 DIAGNOSIS — E041 Nontoxic single thyroid nodule: Secondary | ICD-10-CM

## 2012-05-02 ENCOUNTER — Ambulatory Visit
Admission: RE | Admit: 2012-05-02 | Discharge: 2012-05-02 | Disposition: A | Discharge status: Home / Self Care | Payer: No Typology Code available for payment source | Source: Ambulatory Visit | Attending: Family Medicine | Admitting: Family Medicine

## 2012-05-02 DIAGNOSIS — E041 Nontoxic single thyroid nodule: Secondary | ICD-10-CM

## 2012-06-27 ENCOUNTER — Other Ambulatory Visit: Payer: Self-pay | Admitting: Internal Medicine

## 2012-06-27 DIAGNOSIS — E041 Nontoxic single thyroid nodule: Secondary | ICD-10-CM

## 2012-10-02 ENCOUNTER — Other Ambulatory Visit (HOSPITAL_COMMUNITY)
Admission: RE | Admit: 2012-10-02 | Discharge: 2012-10-02 | Disposition: A | Discharge status: Home / Self Care | Payer: BC Managed Care – PPO | Source: Ambulatory Visit | Attending: Interventional Radiology | Admitting: Interventional Radiology

## 2012-10-02 ENCOUNTER — Ambulatory Visit
Admission: RE | Admit: 2012-10-02 | Discharge: 2012-10-02 | Disposition: A | Discharge status: Home / Self Care | Payer: BC Managed Care – PPO | Source: Ambulatory Visit | Attending: Internal Medicine | Admitting: Internal Medicine

## 2012-10-02 DIAGNOSIS — E049 Nontoxic goiter, unspecified: Secondary | ICD-10-CM | POA: Insufficient documentation

## 2012-10-02 DIAGNOSIS — E041 Nontoxic single thyroid nodule: Secondary | ICD-10-CM

## 2012-10-13 ENCOUNTER — Other Ambulatory Visit: Payer: Self-pay | Admitting: Internal Medicine

## 2012-10-13 DIAGNOSIS — E042 Nontoxic multinodular goiter: Secondary | ICD-10-CM

## 2012-10-14 ENCOUNTER — Other Ambulatory Visit (HOSPITAL_COMMUNITY): Payer: Self-pay | Admitting: Obstetrics and Gynecology

## 2012-10-14 DIAGNOSIS — Z1231 Encounter for screening mammogram for malignant neoplasm of breast: Secondary | ICD-10-CM

## 2012-10-21 ENCOUNTER — Encounter (HOSPITAL_COMMUNITY)
Admission: RE | Admit: 2012-10-21 | Discharge: 2012-10-21 | Disposition: A | Discharge status: Home / Self Care | Payer: BC Managed Care – PPO | Source: Ambulatory Visit | Attending: Internal Medicine | Admitting: Internal Medicine

## 2012-10-21 DIAGNOSIS — E042 Nontoxic multinodular goiter: Secondary | ICD-10-CM | POA: Insufficient documentation

## 2012-10-22 ENCOUNTER — Encounter (HOSPITAL_COMMUNITY)
Admission: RE | Admit: 2012-10-22 | Discharge: 2012-10-22 | Disposition: A | Discharge status: Home / Self Care | Payer: BC Managed Care – PPO | Source: Ambulatory Visit | Attending: Internal Medicine | Admitting: Internal Medicine

## 2012-10-22 MED ORDER — SODIUM PERTECHNETATE TC 99M INJECTION
10.0000 | Freq: Once | INTRAVENOUS | Status: AC | PRN
  Administered 2012-10-22: 10 via INTRAVENOUS

## 2012-10-22 MED ORDER — SODIUM IODIDE I 131 CAPSULE
7.7000 | Freq: Once | INTRAVENOUS | Status: AC | PRN
  Administered 2012-10-21: 7.7 via ORAL

## 2012-10-31 ENCOUNTER — Ambulatory Visit (HOSPITAL_COMMUNITY)
Admission: RE | Admit: 2012-10-31 | Discharge: 2012-10-31 | Disposition: A | Discharge status: Home / Self Care | Payer: BC Managed Care – PPO | Source: Ambulatory Visit | Attending: Obstetrics and Gynecology | Admitting: Obstetrics and Gynecology

## 2012-10-31 DIAGNOSIS — Z1231 Encounter for screening mammogram for malignant neoplasm of breast: Secondary | ICD-10-CM | POA: Insufficient documentation

## 2013-02-25 ENCOUNTER — Encounter: Payer: Self-pay | Admitting: Cardiology

## 2013-05-14 ENCOUNTER — Other Ambulatory Visit: Payer: Self-pay | Admitting: Physician Assistant

## 2013-06-11 ENCOUNTER — Other Ambulatory Visit: Payer: Self-pay | Admitting: Physician Assistant

## 2013-06-11 NOTE — Telephone Encounter (Signed)
Rx was sent to pharmacy electronically. 

## 2013-07-16 ENCOUNTER — Other Ambulatory Visit: Payer: Self-pay | Admitting: *Deleted

## 2013-07-16 ENCOUNTER — Encounter: Payer: Self-pay | Admitting: Cardiology

## 2013-07-16 ENCOUNTER — Ambulatory Visit (INDEPENDENT_AMBULATORY_CARE_PROVIDER_SITE_OTHER): Payer: PRIVATE HEALTH INSURANCE | Admitting: Cardiology

## 2013-07-16 VITALS — BP 118/80 | HR 74 | Ht 61.0 in | Wt 185.2 lb

## 2013-07-16 DIAGNOSIS — I10 Essential (primary) hypertension: Secondary | ICD-10-CM

## 2013-07-16 DIAGNOSIS — I2510 Atherosclerotic heart disease of native coronary artery without angina pectoris: Secondary | ICD-10-CM

## 2013-07-16 DIAGNOSIS — E785 Hyperlipidemia, unspecified: Secondary | ICD-10-CM

## 2013-07-16 DIAGNOSIS — E669 Obesity, unspecified: Secondary | ICD-10-CM

## 2013-07-16 DIAGNOSIS — Z9861 Coronary angioplasty status: Secondary | ICD-10-CM

## 2013-07-16 DIAGNOSIS — R7309 Other abnormal glucose: Secondary | ICD-10-CM

## 2013-07-16 DIAGNOSIS — E1169 Type 2 diabetes mellitus with other specified complication: Secondary | ICD-10-CM | POA: Insufficient documentation

## 2013-07-16 DIAGNOSIS — I2109 ST elevation (STEMI) myocardial infarction involving other coronary artery of anterior wall: Secondary | ICD-10-CM

## 2013-07-16 DIAGNOSIS — R7302 Impaired glucose tolerance (oral): Secondary | ICD-10-CM | POA: Insufficient documentation

## 2013-07-16 MED ORDER — NITROGLYCERIN 0.4 MG SL SUBL: 0.4000 mg | SUBLINGUAL_TABLET | SUBLINGUAL | Status: DC | PRN

## 2013-07-16 NOTE — Patient Instructions (Addendum)
You are doing well.  We need to check your cholesterol levels again in November.  They look OK - I just want to make sure that the numbers are headed in the right direction.  Leonie Man, MD  Your physician wants you to follow-up in: 12 months. You will receive a reminder letter in the mail two months in advance. If you don't receive a letter, please call our office to schedule the follow-up appointment. As his symptoms

## 2013-07-18 ENCOUNTER — Encounter: Payer: Self-pay | Admitting: Cardiology

## 2013-07-18 DIAGNOSIS — I10 Essential (primary) hypertension: Secondary | ICD-10-CM | POA: Insufficient documentation

## 2013-07-18 DIAGNOSIS — E669 Obesity, unspecified: Secondary | ICD-10-CM | POA: Insufficient documentation

## 2013-07-18 NOTE — Assessment & Plan Note (Signed)
Pretty much close to goal with LDL. HDL was good. I will make sure she is going the right direction, therefore will recheck lipids in November time frame to make sure that with the switch from Crestor to Lipitor that we have not lost ground.

## 2013-07-18 NOTE — Assessment & Plan Note (Signed)
Doing very well overall from a symptomatic standpoint. Unfortunately we are unable to reassess her EF after her anterior MI, out of meds and based on how well she is doing better EF is improved. She doesn't have any heart failure symptoms to speak of and therefore no clear indication to check one now. Otherwise she is on a good regimen of aspirin plus or Brilinta as dual antiplatelet. We are also, we could potentially switch her to aspirin plus Plavix if financial issues become concerned. She is not on statin beta blocker and ACE inhibitor , with reatively goodl lipid and blood pressure control.

## 2013-07-18 NOTE — Assessment & Plan Note (Signed)
Currently on DAPT - with aspirin plus Brilinta; see above for thoughts on potentially switching. For now she is fine with taking Brilinta has not had any significant bleeding issues.

## 2013-07-18 NOTE — Assessment & Plan Note (Signed)
Glucose level of begun on the last check. Check hemoglobin A1c with her next labs to confirm steady glycemic control

## 2013-07-18 NOTE — Assessment & Plan Note (Signed)
Very well controlled on current regimen. No change

## 2013-07-18 NOTE — Progress Notes (Signed)
PATIENT: JAXYN DYKSTRA MRN: [id number]  DOB: 01/27/1951   DOV:07/18/2013 PCP: Gara Kroner, MD  Clinic Note: Chief Complaint  Patient presents with  . Annual Exam    NO CHEST PAIN,no sob , no edema,sometime left leg hurts    HPI: MARIBELA KENNEDY is a 62 y.o. female with a PMH below who presents today for annual follow-up.  Interval History: She comes in today to be well for cardiac standpoint. She just a bit discouraged that she has not lose any weight. This is despite several efforts. She denies any real cardiac symptoms of chest discomfort or shortness of breath at rest or exertion. She is not had any syncope or near-syncopal type symptoms. She has a little discomfort in her left leg on occasion which sounds mostly musculoskeletal in nature. She's had a recent bout of shingles (shortly after I saw her last year. Thankfully her thyroid nodule was determined to be non-malignant. She denies any heart failure symptoms of PND, orthopnea or edema. She did have labs recently checked in May by her PCP.  The remainder of Cardiovascular ROS: no chest pain or dyspnea on exertion positive for - palpitations and occasional negative for - edema, irregular heartbeat, loss of consciousness, murmur, orthopnea, paroxysmal nocturnal dyspnea, rapid heart rate or shortness of breath: Additional cardiac review of systems: Lightheadedness - no, dizziness - no, syncope/near-syncope - no; TIA/amaurosis fugax - no Melena - no, hematochezia no; hematuria - no; nosebleeds - no; claudication - no   Past Medical History  Diagnosis Date  . History of ST elevation myocardial infarction (STEMI) of anterior wall November 2012    With cardiac arrest, 100% mobility occlusion. --> Promus DES   . CAD S/P percutaneous coronary angioplasty November 2012    Promus DES - mid LAD 3.5 mm x 24 mm (3.72 mm)   . Hypertension   . Dyslipidemia, goal LDL below 70      on statin, close to goal  . Glucose intolerance (impaired glucose  tolerance)   . Former moderate cigarette smoker (10-19 per day)   . H/O thyroid nodule     Benign    Prior Cardiac Evaluation and Past Surgical History: Past Surgical History  Procedure Laterality Date  . Cardiac catheterization  November 2012    For anterior STEMI/cardiac arrest -- 100% mid LAD occlusion  . Coronary angioplasty with stent placement  November 2012     mid LAD PCI --> Promus Element DES 3.5 mm at 24 mm (3.72 mm)  . Transthoracic echocardiogram  November 2012    EF 40-45%, moderate at K. of mid and distal inferior septum and anterior apical myocardium. Grade 1 diastolic function. -- Followup echocardiogram to reassess his EF was denied by insurance company    No Known Allergies  Current Outpatient Prescriptions  Medication Sig Dispense Refill  . aspirin 81 MG tablet Take 81 mg by mouth daily.        Marland Kitchen atorvastatin (LIPITOR) 40 MG tablet Take 40 mg by mouth daily.      . carvedilol (COREG) 6.25 MG tablet Take 1 tablet (6.25 mg total) by mouth 2 (two) times daily with a meal.  60 tablet  2  . doxycycline (DORYX) 100 MG EC tablet Take 50 mg by mouth daily.        Marland Kitchen lisinopril (PRINIVIL,ZESTRIL) 2.5 MG tablet TAKE 1 TABLET BY MOUTH DAILY  30 tablet  8  . Ticagrelor (BRILINTA) 90 MG TABS tablet Take 1 tablet (90 mg total)  by mouth 2 (two) times daily.  60 tablet  11  . traZODone (DESYREL) 100 MG tablet Take by mouth at bedtime. Dose unknown       . nitroGLYCERIN (NITROSTAT) 0.4 MG SL tablet Place 1 tablet (0.4 mg total) under the tongue every 5 (five) minutes as needed for chest pain.  25 tablet  3   No current facility-administered medications for this visit.   statin was recently switched from Crestor to Lipitor  History   Social History Narrative   Single mother of one, grandmother of 2. She works for [employer] for [employer].   She quit smoking time of her MI in November 2012. Does not take alcohol.   She had been doing really well he exercises  at least 3-4 days a week, but really get discouraged that she is not been able to drop weight.    ROS: A comprehensive Review of Systems - Negative except occasional Left leg discomfort from hip to calf. No GU/GI symptoms.  All other symptoms are negative - just feeling discouraged because of   PHYSICAL EXAM BP 118/80  Pulse 74  Ht 5\' 1"  (1.549 m)  Wt 185 lb 3.2 oz (84.006 kg)  BMI 35.01 kg/m2 General appearance: alert, cooperative, appears stated age, no distress and midly obese Neck: no adenopathy, no carotid bruit and no JVD Lungs: clear to auscultation bilaterally, normal percussion bilaterally and non-labored Heart: regular rate and rhythm, S1, S2 normal, no murmur, click, rub or gallop Abdomen: soft, non-tender; bowel sounds normal; no masses,  no organomegaly; no HJR Extremities: extremities normal, atraumatic, no cyanosis, and edema, mild varicose veins Pulses: 2+ and symmetric; Neurologic: Mental status: Alert, oriented, thought content appropriate Cranial nerves: normal (II-XII grossly intact)  [id number] today: Yes Rate:74 , Rhythm: NSR, CRO Septal MI, age undetermined; ST--T wave abnormality - consider anterior ischemia -- no change  Recent Labs: 02/20/2013  BUN/creatinine 19/102; LFTs normal  TC 144, TG 208, at HDL 37, LDL 79  ASSESSMENT / PLAN: ST elevation myocardial infarction (STEMI) of anterior wall:   Proximal LAD  insertion of a 3.5x24 mm Promus element DES Doing very well overall from a symptomatic standpoint. Unfortunately we are unable to reassess her EF after her anterior MI, out of meds and based on how well she is doing better EF is improved. She doesn't have any heart failure symptoms to speak of and therefore no clear indication to check one now. Otherwise she is on a good regimen of aspirin plus or Brilinta as dual antiplatelet. We are also, we could potentially switch her to aspirin plus Plavix if financial issues become concerned. She is not on  statin beta blocker and ACE inhibitor , with reatively goodl lipid and blood pressure control.  CAD S/P percutaneous coronary angioplasty Currently on DAPT - with aspirin plus Brilinta; see above for thoughts on potentially switching. For now she is fine with taking Brilinta has not had any significant bleeding issues.  Hypertension Very well controlled on current regimen. No change  Dyslipidemia, goal LDL below 70 Pretty much close to goal with LDL. HDL was good. I will make sure she is going the right direction, therefore will recheck lipids in November time frame to make sure that with the switch from Crestor to Lipitor that we have not lost ground.  Glucose intolerance (impaired glucose tolerance) Glucose level of begun on the last check. Check hemoglobin A1c with her next labs to confirm steady glycemic control  Obesity (BMI 30-39.9) This  is where she really needs encouragement, she has gotten frustrated with her lack of ability to lose weight. She says she has been exercising, but it really seems that she needs to do some dietary modification. She claims to be eating a relatively healthy diet, but that she then snacks. I simply encouraged her to get back into her exercise and continue to monitor her diet will plan for snacks, try to eat things like fruits and vegetables. I also report that is that he was trying to lose weight at the same time she did stop smoking.   Orders Placed This Encounter  Procedures  . Lipid Profile    Standing Status: Future     Number of Occurrences: 1     Standing Expiration Date: 10/08/2013  . Comp Met (CMET)  . HgB A1c  . EKG 12-Lead    Scheduling Instructions:     [facility] [employer] Specific Question:  Where should this test be performed    Answer:  OTHER   Meds ordered this encounter  Medications  . atorvastatin (LIPITOR) 40 MG tablet    Sig: Take 40 mg by mouth daily.    Followup: One year  DAVID W.  Ellyn Hack, M.D., M.S. [facility] [address] [place], [place]  [address]  [phone] Pager # [phone]

## 2013-07-18 NOTE — Assessment & Plan Note (Addendum)
This is where she really needs encouragement, she has gotten frustrated with her lack of ability to lose weight. She says she has been exercising, but it really seems that she needs to do some dietary modification. She claims to be eating a relatively healthy diet, but that she then snacks. I simply encouraged her to get back into her exercise and continue to monitor her diet will plan for snacks, try to eat things like fruits and vegetables. I also report that is that he was trying to lose weight at the same time she did stop smoking.

## 2013-08-06 ENCOUNTER — Other Ambulatory Visit: Payer: Self-pay | Admitting: Cardiology

## 2013-08-06 ENCOUNTER — Other Ambulatory Visit: Payer: Self-pay | Admitting: Physician Assistant

## 2013-08-06 NOTE — Telephone Encounter (Signed)
Rx was sent to pharmacy electronically. 

## 2013-08-07 LAB — COMPREHENSIVE METABOLIC PANEL
ALT: 15 U/L (ref 0–35)
AST: 14 U/L (ref 0–37)
BUN: 20 mg/dL (ref 6–23)
Creat: 1.01 mg/dL (ref 0.50–1.10)
Total Bilirubin: 0.5 mg/dL (ref 0.3–1.2)

## 2013-08-07 LAB — LIPID PANEL
Cholesterol: 166 mg/dL (ref 0–200)
HDL: 42 mg/dL (ref >39)
LDL Cholesterol: 81 mg/dL (ref 0–99)
Total CHOL/HDL Ratio: 4 Ratio
Triglycerides: 216 mg/dL — ABNORMAL HIGH (ref <150)
VLDL: 43 mg/dL — ABNORMAL HIGH (ref 0–40)

## 2013-08-07 LAB — HEMOGLOBIN A1C: Mean Plasma Glucose: 114 mg/dL (ref <117)

## 2013-08-13 ENCOUNTER — Telehealth: Payer: Self-pay | Admitting: *Deleted

## 2013-08-13 NOTE — Telephone Encounter (Signed)
Message copied by Raiford Simmonds on Thu Aug 13, 2013 11:25 AM ------      Message from: Leonie Man      Created: Fri Aug 07, 2013  9:05 PM       Pretty good -- sugar levels are holding steady.      Leonie Man, MD       ------

## 2013-08-13 NOTE — Telephone Encounter (Signed)
Message copied by Raiford Simmonds on Thu Aug 13, 2013 11:23 AM ------      Message from: Leonie Man      Created: Fri Aug 07, 2013  9:06 PM       Overall - not as good as before.  We will need to reassess after the PCI situation.            Leonie Man, MD       ------

## 2013-08-13 NOTE — Telephone Encounter (Signed)
Spoke to patient. Result given . Verbalized understanding  Patient request a copy to be sent to her PCP.

## 2013-09-05 ENCOUNTER — Other Ambulatory Visit: Payer: Self-pay | Admitting: Cardiology

## 2013-09-05 ENCOUNTER — Other Ambulatory Visit: Payer: Self-pay | Admitting: Physician Assistant

## 2013-09-07 NOTE — Telephone Encounter (Signed)
Rx was sent to pharmacy electronically. 

## 2013-10-02 ENCOUNTER — Other Ambulatory Visit (HOSPITAL_COMMUNITY): Payer: Self-pay | Admitting: Obstetrics and Gynecology

## 2013-10-02 DIAGNOSIS — Z1231 Encounter for screening mammogram for malignant neoplasm of breast: Secondary | ICD-10-CM

## 2013-10-05 ENCOUNTER — Encounter: Payer: Self-pay | Admitting: Cardiology

## 2013-11-06 ENCOUNTER — Ambulatory Visit (HOSPITAL_COMMUNITY)
Admission: RE | Admit: 2013-11-06 | Discharge: 2013-11-06 | Disposition: A | Discharge status: Home / Self Care | Payer: PRIVATE HEALTH INSURANCE | Source: Ambulatory Visit | Attending: Obstetrics and Gynecology | Admitting: Obstetrics and Gynecology

## 2013-11-06 DIAGNOSIS — Z1231 Encounter for screening mammogram for malignant neoplasm of breast: Secondary | ICD-10-CM | POA: Insufficient documentation

## 2014-01-29 ENCOUNTER — Other Ambulatory Visit: Payer: Self-pay | Admitting: Physician Assistant

## 2014-01-29 NOTE — Telephone Encounter (Signed)
Rx was sent to pharmacy electronically. 

## 2014-02-26 ENCOUNTER — Telehealth: Payer: Self-pay | Admitting: Cardiology

## 2014-02-26 DIAGNOSIS — I25810 Atherosclerosis of coronary artery bypass graft(s) without angina pectoris: Secondary | ICD-10-CM

## 2014-02-26 NOTE — Telephone Encounter (Signed)
Pt said she can no longer afford Brilinta. She wants to know if there is something else she can take that is not too expensive.

## 2014-02-26 NOTE — Telephone Encounter (Signed)
Message sent to Justice

## 2014-03-01 NOTE — Telephone Encounter (Signed)
forward

## 2014-03-04 MED ORDER — CLOPIDOGREL BISULFATE 75 MG PO TABS: 75.0000 mg | ORAL_TABLET | Freq: Every day | ORAL | Status: DC

## 2014-03-04 NOTE — Telephone Encounter (Signed)
Reviewed with Dr. Ellyn Hack, will switch patient to clopidogrel 75mg  daily and have P2Y12 drawn about 2-3 weeks after starting.  Spoke with patient, she understands need for testing.

## 2014-03-16 ENCOUNTER — Other Ambulatory Visit: Payer: Self-pay

## 2014-03-16 MED ORDER — CARVEDILOL 6.25 MG PO TABS: 6.2500 mg | ORAL_TABLET | Freq: Two times a day (BID) | ORAL | Status: DC

## 2014-03-16 NOTE — Telephone Encounter (Signed)
Rx was sent to pharmacy electronically. 

## 2014-03-22 ENCOUNTER — Other Ambulatory Visit: Payer: Self-pay | Admitting: *Deleted

## 2014-03-22 MED ORDER — LISINOPRIL 2.5 MG PO TABS: ORAL_TABLET | ORAL | Status: DC

## 2014-03-22 NOTE — Telephone Encounter (Signed)
Rx was sent to pharmacy electronically. 

## 2014-03-25 LAB — PLATELET INHIBITION P2Y12: PLATELET FUNCTION P2Y12: 298 [PRU] (ref 194–418)

## 2014-03-25 NOTE — Telephone Encounter (Signed)
Quick Note:  P2 Y12 Platelet Inhibition Assay: 298 with goal < 180.  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   We are ~2.5 yrs out from her PCI & are probably OK stopping the Plavix (Brilinta/Effient) drugs from a Stent perspective. Of course there are no guarantees, but it is probably safe stopping these meds & go to simply ASA 81 mg alone.  Leonie Man, MD  ______

## 2014-03-31 ENCOUNTER — Telehealth: Payer: Self-pay | Admitting: *Deleted

## 2014-03-31 MED ORDER — TICAGRELOR 90 MG PO TABS: ORAL_TABLET | ORAL | Status: DC

## 2014-03-31 NOTE — Telephone Encounter (Signed)
Message copied by Raiford Simmonds on Wed Mar 31, 2014  9:47 AM ------      Message from: Ellyn Hack, DAVID W      Created: Thu Mar 25, 2014  3:59 PM       P2 Y12 Platelet Inhibition Assay: 298 with goal < 180.            This means that Plavix is not effective b/c Ms. 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              We are ~2.5 yrs out from her PCI & are probably OK stopping the Plavix (Brilinta/Effient) drugs from a Stent perspective.  Of course there are no guarantees, but it is probably safe stopping these meds & go to simply ASA 81 mg alone.            Leonie Man, MD       ------

## 2014-03-31 NOTE — Telephone Encounter (Signed)
Spoke to patient. Result given . Verbalized understanding Patient states she will restart the Brilinta. [facility]  Discontinue plavix

## 2014-07-07 ENCOUNTER — Other Ambulatory Visit: Payer: Self-pay | Admitting: Cardiology

## 2014-07-07 NOTE — Telephone Encounter (Signed)
Rx was sent to pharmacy electronically. 

## 2014-07-23 ENCOUNTER — Ambulatory Visit (INDEPENDENT_AMBULATORY_CARE_PROVIDER_SITE_OTHER): Payer: BC Managed Care – PPO | Admitting: Cardiology

## 2014-07-23 ENCOUNTER — Telehealth: Payer: Self-pay | Admitting: Cardiology

## 2014-07-23 ENCOUNTER — Encounter: Payer: Self-pay | Admitting: Cardiology

## 2014-07-23 VITALS — BP 122/60 | HR 76 | Ht 61.0 in | Wt 187.6 lb

## 2014-07-23 DIAGNOSIS — E669 Obesity, unspecified: Secondary | ICD-10-CM

## 2014-07-23 DIAGNOSIS — I10 Essential (primary) hypertension: Secondary | ICD-10-CM

## 2014-07-23 DIAGNOSIS — Z9861 Coronary angioplasty status: Secondary | ICD-10-CM

## 2014-07-23 DIAGNOSIS — I2510 Atherosclerotic heart disease of native coronary artery without angina pectoris: Secondary | ICD-10-CM

## 2014-07-23 DIAGNOSIS — E785 Hyperlipidemia, unspecified: Secondary | ICD-10-CM

## 2014-07-23 DIAGNOSIS — I2109 ST elevation (STEMI) myocardial infarction involving other coronary artery of anterior wall: Secondary | ICD-10-CM

## 2014-07-23 NOTE — Assessment & Plan Note (Signed)
No recent labs -- due to see PCP in December.   On Statin -- ? Will she need to adjust dose vs. change to Crestor.

## 2014-07-23 NOTE — Telephone Encounter (Signed)
Samantha Mcbride is calling about her Brilinta that was prescribed to her today , she thought she was supposed to take one pill of the  60 mg , but the pharmacy saids it is 2 pills a day of the 60mg  . She is confused in which she needs to be taking . Please call   Thanks

## 2014-07-23 NOTE — Assessment & Plan Note (Signed)
Stable - no further SSx suggestive of her anginal equivalent chest discomfort & dyspnea. Previous attempt to order Post MI Echo - declined by insurance, but was only mildly reduced EF post MI. On stable regimen.

## 2014-07-23 NOTE — Progress Notes (Signed)
PCP: Gara Kroner, MD  Clinic Note: Chief Complaint  Patient presents with  . Annual Exam    NO CHEST PAIN , NO SOB , NO EDEMA,    HPI: Samantha Mcbride is a 63 y.o. female with a PMH below who presents today for ~ 1 year+ follow up of CAD & CRFs. Last seen in April 2014 - doing well.  Past Medical History  Diagnosis Date  . History of ST elevation myocardial infarction (STEMI) of anterior wall November 2012    With cardiac arrest, 100% mobility occlusion. --> Promus DES   . CAD S/P percutaneous coronary angioplasty November 2012    Promus DES - mid LAD 3.5 mm x 24 mm (3.72 mm)   . Hypertension   . Dyslipidemia, goal LDL below 70      on statin, close to goal  . Glucose intolerance (impaired glucose tolerance)   . Former moderate cigarette smoker (10-19 per day)   . H/O thyroid nodule     Benign    Prior Cardiac Evaluation and Past Surgical History: Past Surgical History  Procedure Laterality Date  . Cardiac catheterization  November 2012    For anterior STEMI/cardiac arrest -- 100% mid LAD occlusion  . Coronary angioplasty with stent placement  November 2012     mid LAD PCI --> Promus Element DES 3.5 mm at 24 mm (3.72 mm)  . Transthoracic echocardiogram  November 2012    EF 40-45%, moderate at K. of mid and distal inferior septum and anterior apical myocardium. Grade 1 diastolic function. -- Followup echocardiogram to reassess his EF was denied by insurance company    Interval History: She seems to be doing "OK" per her report.  Still not really exercising much.  She notes dyspnea with moderate to strenuous exertion - not with routine activity.  No resting or exertional chest tightness or pressure.  Intermittently has "anxiety attacks" that make her heart race & she feels tightness in her chest with SOB -- if she sits down & breaths "easy", she can calm herself down.   These episodes are usually triggered by social situations / interactions. Mild positional dizziness (esp.  In the morning) - no syncope/near syncope, TIA or Amaurosis fugax.  ROS: A comprehensive was performed. Review of Systems  Constitutional: Negative for weight loss and malaise/fatigue.  HENT: Positive for nosebleeds. Negative for congestion.   Respiratory: Negative for cough, hemoptysis and shortness of breath.   Cardiovascular: Negative.  Negative for claudication.       Per HPI  Gastrointestinal: Negative for abdominal pain, blood in stool and melena.  Genitourinary: Negative for hematuria and flank pain.  Musculoskeletal: Positive for joint pain.  Neurological: Positive for dizziness. Negative for sensory change, speech change, focal weakness, seizures and loss of consciousness.  Endo/Heme/Allergies: Bruises/bleeds easily.  Psychiatric/Behavioral: Negative for depression and substance abuse. The patient is nervous/anxious.   All other systems reviewed and are negative.   Current Outpatient Prescriptions on File Prior to Visit  Medication Sig Dispense Refill  . aspirin 81 MG tablet Take 81 mg by mouth daily.        Marland Kitchen atorvastatin (LIPITOR) 40 MG tablet TAKE 1 TABLET AT BEDTIME (REPLACES CRESTOR)  30 tablet  6  . carvedilol (COREG) 6.25 MG tablet Take 1 tablet (6.25 mg total) by mouth 2 (two) times daily with a meal.  60 tablet  4  . doxycycline (DORYX) 100 MG EC tablet Take 50 mg by mouth daily.        Marland Kitchen  lisinopril (PRINIVIL,ZESTRIL) 2.5 MG tablet Take 1 tablet (2.5 mg total) by mouth daily. <please make appointment for refills>  30 tablet  1  . nitroGLYCERIN (NITROSTAT) 0.4 MG SL tablet Place 1 tablet (0.4 mg total) under the tongue every 5 (five) minutes as needed for chest pain.  25 tablet  3  . traZODone (DESYREL) 100 MG tablet Take by mouth at bedtime. Dose unknown        No current facility-administered medications on file prior to visit.   ALLERGIES REVIEWED IN EPIC -- No change SOCIAL AND FAMILY HISTORY REVIEWED IN EPIC -- No change  Wt Readings from Last 3 Encounters:    07/23/14 187 lb 9.6 oz (85.095 kg)  07/16/13 185 lb 3.2 oz (84.006 kg)  08/31/11 168 lb (76.204 kg)   PHYSICAL EXAM BP 122/60  Pulse 76  Ht 5\' 1"  (1.549 m)  Wt 187 lb 9.6 oz (85.095 kg)  BMI 35.47 kg/m2 General appearance: alert, cooperative, appears stated age, no distress and midly obese  Neck: no adenopathy, no carotid bruit and no JVD  Lungs: clear to auscultation bilaterally, normal percussion bilaterally and non-labored  Heart: regular rate and rhythm, S1, S2 normal, no murmur, click, rub or gallop  Abdomen: soft, non-tender; bowel sounds normal; no masses, no organomegaly; no HJR  Extremities: extremities normal, atraumatic, no cyanosis, and edema, mild varicose veins  Pulses: 2+ and symmetric;  Neurologic: Mental status: Alert, oriented, thought content appropriate  Cranial nerves: normal (II-XII grossly intact)   Adult ECG Report  Rate: 76 ;  Rhythm: normal sinus rhythm, overall low voltage; non-specific Anteroseptal TWI - CRO ischemia; poor Anterior R wave progression.  Narrative Interpretation: stable EKG  Recent Labs:  NA Lipid Panel     Component Value Date/Time   CHOL 166 08/07/2013 0802   TRIG 216* 08/07/2013 0802   HDL 42 08/07/2013 0802   CHOLHDL 4.0 08/07/2013 0802   VLDL 43* 08/07/2013 0802   LDLCALC 81 08/07/2013 0802   ASSESSMENT / PLAN: H/O Anterior STEMI:  Proximal LAD  insertion of a 3.5x24 mm Promus element DES (08/2011) Stable - no further SSx suggestive of her anginal equivalent chest discomfort & dyspnea. Previous attempt to order Post MI Echo - declined by insurance, but was only mildly reduced EF post MI. On stable regimen.  CAD S/P percutaneous coronary angioplasty With easy bruising & nosebleeds - she is ~ 3 yrs post MI-PCI.  Can d/c ASA & cut Brilinta dose to 60 mg BID (vs. 1/2 tab bid). On statin & ACE-I & BB.  Dyslipidemia, goal LDL below 70 No recent labs -- due to see PCP in December.   On Statin -- ? Will she need to adjust dose  vs. change to Crestor.  Essential hypertension Stable on ACE-I & BB  Obesity (BMI 30-39.9) Counseled on importance of dietary adjustment & increasing exercise.    No orders of the defined types were placed in this encounter.   Meds ordered this encounter  Medications  . DENTA 5000 PLUS 1.1 % CREA dental cream    Sig: Place 1 application onto teeth at bedtime.   . Ticagrelor (BRILINTA PO)    Sig: Take 60 mg by mouth 2 (two) times daily. Samples given 07-23-2014 [id number]; Exp-12/2016 Medication called into pharmacy to place on hold #30 with 5 refills-[facility]14.     Followup: 1 yr   Leonie Man, M.D., M.S. Interventional Cardiologist   Pager # [phone]

## 2014-07-23 NOTE — Assessment & Plan Note (Signed)
Counseled on importance of dietary adjustment & increasing exercise.

## 2014-07-23 NOTE — Assessment & Plan Note (Signed)
Stable on ACE-I & BB

## 2014-07-23 NOTE — Patient Instructions (Addendum)
Dr Ellyn Hack has recommended making the following medication changes:  STOP ASPIRIN  DECREASE Brilinta to 60 mg tabs (if we can arrange Rx for this dose - if not 1/2 tab BID)  Your physician encouraged you to lose weight for better health.  Dr Ellyn Hack wants you to follow-up in 1 year (30 min appt). You will receive a reminder letter in the mail one months in advance. If you don't receive a letter, please call our office to schedule the follow-up appointment.

## 2014-07-23 NOTE — Assessment & Plan Note (Addendum)
With easy bruising & nosebleeds - she is ~ 3 yrs post MI-PCI.  Can d/c ASA & cut Brilinta dose to 60 mg BID (vs. 1/2 tab bid). On statin & ACE-I & BB.

## 2014-07-23 NOTE — Telephone Encounter (Signed)
PER DR HARDING , PATIENT MAY TAKE 1/2 TABLET TWICE A DAY UNTIL EMPTY THEN start 60 mg Brilinta twice a day. Patient verbalized understanding.

## 2014-07-24 ENCOUNTER — Other Ambulatory Visit: Payer: Self-pay | Admitting: Cardiology

## 2014-07-26 NOTE — Telephone Encounter (Signed)
Rx was sent to pharmacy electronically. 

## 2014-07-27 NOTE — Addendum Note (Signed)
Addended by: Raiford Simmonds on: 07/27/2014 01:10 PM   Modules accepted: Orders

## 2014-08-04 ENCOUNTER — Other Ambulatory Visit: Payer: Self-pay | Admitting: Cardiology

## 2014-08-04 NOTE — Telephone Encounter (Signed)
E sent to pharmacy 

## 2014-09-16 ENCOUNTER — Other Ambulatory Visit: Payer: Self-pay | Admitting: Cardiology

## 2014-09-23 ENCOUNTER — Encounter (HOSPITAL_COMMUNITY): Payer: Self-pay | Admitting: Cardiovascular Disease

## 2014-09-27 ENCOUNTER — Encounter: Payer: Self-pay | Admitting: Cardiology

## 2014-10-20 ENCOUNTER — Other Ambulatory Visit (HOSPITAL_COMMUNITY): Payer: Self-pay | Admitting: Obstetrics and Gynecology

## 2014-10-20 DIAGNOSIS — Z1231 Encounter for screening mammogram for malignant neoplasm of breast: Secondary | ICD-10-CM

## 2014-11-12 ENCOUNTER — Ambulatory Visit (HOSPITAL_COMMUNITY)
Admission: RE | Admit: 2014-11-12 | Discharge: 2014-11-12 | Disposition: A | Discharge status: Home / Self Care | Payer: BLUE CROSS/BLUE SHIELD | Source: Ambulatory Visit | Attending: Obstetrics and Gynecology | Admitting: Obstetrics and Gynecology

## 2014-11-12 DIAGNOSIS — Z1231 Encounter for screening mammogram for malignant neoplasm of breast: Secondary | ICD-10-CM | POA: Diagnosis not present

## 2014-11-15 ENCOUNTER — Telehealth: Payer: Self-pay | Admitting: Cardiology

## 2014-11-15 MED ORDER — TICAGRELOR 90 MG PO TABS: 90.0000 mg | ORAL_TABLET | Freq: Two times a day (BID) | ORAL | Status: DC

## 2014-11-15 NOTE — Telephone Encounter (Signed)
Medication changes made per Dr. Allison Quarry advice. Rx(s) sent to pharmacy electronically. Patient notified of changes and aware to STOP aspirin

## 2014-11-15 NOTE — Telephone Encounter (Signed)
Pt called in stating that Dr. Ellyn Hack switched her medication from Brilinta 90mg  to 60mg  which is way more expensive she said. She would like another prescription called in for the 90mg . Please follow up with the pt  Thanks

## 2014-11-15 NOTE — Telephone Encounter (Signed)
OK - if we do 90 mg Brilinta - can d/c ASA.  Leonie Man, MD

## 2014-11-15 NOTE — Telephone Encounter (Signed)
Message routed to Dr. Ellyn Hack to advise on medication change

## 2015-01-06 ENCOUNTER — Telehealth: Payer: Self-pay | Admitting: Cardiology

## 2015-01-06 NOTE — Telephone Encounter (Signed)
Will forward for dr harding review and clearance

## 2015-01-06 NOTE — Telephone Encounter (Signed)
Pt called in stating that she will be having oral surgery next month and would like to know when some could come off the Brilinta and start back. Please f/u with pt  Thanks

## 2015-01-07 NOTE — Telephone Encounter (Signed)
She is fine stopping for one to 5 days preop and restarting maybe 2 days postop.  Leonie Man, MD

## 2015-01-07 NOTE — Telephone Encounter (Signed)
Spoke to patient Information given , per Dr Ellyn Hack instructions. Patient verbalized understanding.

## 2015-01-28 ENCOUNTER — Telehealth: Payer: Self-pay | Admitting: *Deleted

## 2015-01-28 NOTE — Telephone Encounter (Signed)
brilinta prior authorization request form completed and sent for prior authorize.

## 2015-01-28 NOTE — Telephone Encounter (Signed)
NOTIFIED [facility]

## 2015-03-22 ENCOUNTER — Encounter: Payer: Self-pay | Admitting: Cardiology

## 2015-05-26 ENCOUNTER — Other Ambulatory Visit: Payer: Self-pay | Admitting: Cardiology

## 2015-05-26 NOTE — Telephone Encounter (Signed)
Rx(s) sent to pharmacy electronically.  

## 2015-07-21 ENCOUNTER — Other Ambulatory Visit: Payer: Self-pay | Admitting: Cardiology

## 2015-07-29 ENCOUNTER — Other Ambulatory Visit: Payer: Self-pay | Admitting: Cardiology

## 2015-07-29 MED ORDER — TICAGRELOR 90 MG PO TABS: 90.0000 mg | ORAL_TABLET | Freq: Two times a day (BID) | ORAL | Status: DC

## 2015-07-29 NOTE — Telephone Encounter (Signed)
Rx(s) sent to pharmacy electronically.  

## 2015-07-29 NOTE — Telephone Encounter (Signed)
°  STAT if patient is at the pharmacy , call can be transferred to refill team.   1. Which medications need to be refilled? Brilinta   2. Which pharmacy[facility]location [facility] [address]  3. Do they need a 30 day or 90 day supply?  Did not specify

## 2015-08-01 ENCOUNTER — Ambulatory Visit (INDEPENDENT_AMBULATORY_CARE_PROVIDER_SITE_OTHER): Payer: 59 | Admitting: Cardiology

## 2015-08-01 ENCOUNTER — Encounter: Payer: Self-pay | Admitting: Cardiology

## 2015-08-01 VITALS — BP 104/62 | HR 73 | Ht 61.0 in | Wt 185.0 lb

## 2015-08-01 DIAGNOSIS — I10 Essential (primary) hypertension: Secondary | ICD-10-CM | POA: Diagnosis not present

## 2015-08-01 DIAGNOSIS — I2510 Atherosclerotic heart disease of native coronary artery without angina pectoris: Secondary | ICD-10-CM | POA: Diagnosis not present

## 2015-08-01 DIAGNOSIS — Z9861 Coronary angioplasty status: Secondary | ICD-10-CM

## 2015-08-01 DIAGNOSIS — R7302 Impaired glucose tolerance (oral): Secondary | ICD-10-CM

## 2015-08-01 DIAGNOSIS — I2109 ST elevation (STEMI) myocardial infarction involving other coronary artery of anterior wall: Secondary | ICD-10-CM | POA: Diagnosis not present

## 2015-08-01 DIAGNOSIS — E785 Hyperlipidemia, unspecified: Secondary | ICD-10-CM

## 2015-08-01 DIAGNOSIS — E669 Obesity, unspecified: Secondary | ICD-10-CM

## 2015-08-01 MED ORDER — NITROGLYCERIN 0.4 MG SL SUBL
SUBLINGUAL_TABLET | SUBLINGUAL | Status: AC
Start: 2015-08-01 — End: ?

## 2015-08-01 NOTE — Patient Instructions (Signed)
No changes at present   if you feel a little dizzy in the morning ,you may take lisinopril it after lunch.  Your physician wants you to follow-up in 12 months with Dr Ellyn Hack.  You will receive a reminder letter in the mail two months in advance. If you don't receive a letter, please call our office to schedule the follow-up appointment.

## 2015-08-01 NOTE — Progress Notes (Signed)
PCP: Gara Kroner, MD  Clinic Note: Chief Complaint  Patient presents with  . Annual Exam    NO COMPLAINTS    HPI: Samantha Mcbride is a 64 y.o. female with a PMH below who presents today for annual follow-up of CAD & CRFs.  Samantha Mcbride was last seen in Oct 2015 doing well.  Recent Hospitalizations: none  Studies Reviewed: none  Interval History: Samantha Mcbride presents today feeling well with no cardiac complaints.  She has had the occasional cold or URI Sx, but no symptoms reminiscent of her MI.  Less frequent "panick attacks" with rapid HR - easily controlled with calm breathing --> she has been trying to avoid stressful social situations. Does not do routine exercise, but is active around the house & doing yard work.  She denies any anginal CP with rest or exertion.  No PND, orthopnea or edema.  Occasional mild palpitations but no lightheadedness, dizziness, weakness. No TIA/amaurosis fugax  or syncope/near syncope symptoms. No claudication.  ROS: A comprehensive was performed. Review of Systems  Constitutional: Negative for weight loss and malaise/fatigue.  HENT: Positive for congestion and nosebleeds (only with dry weather & allergies).   Eyes: Negative for blurred vision.  Respiratory: Negative for cough and shortness of breath.   Cardiovascular: Positive for leg swelling (mild @ end of the day).  Musculoskeletal: Positive for back pain and joint pain (hips & knees).  Neurological: Positive for dizziness (positional - more like vertigo) and headaches.  Endo/Heme/Allergies: Bruises/bleeds easily (much better after stopping ASA).  Psychiatric/Behavioral: Negative for depression and memory loss. The patient is nervous/anxious. The patient does not have insomnia.   All other systems reviewed and are negative.   Past Medical History  Diagnosis Date  . History of ST elevation myocardial infarction (STEMI) of anterior wall November 2012    With cardiac arrest, 100% mobility  occlusion. --> Promus DES   . CAD S/P percutaneous coronary angioplasty November 2012    Promus DES - mid LAD 3.5 mm x 24 mm (3.72 mm)   . Hypertension   . Dyslipidemia, goal LDL below 70      on statin, close to goal  . Glucose intolerance (impaired glucose tolerance)   . Former moderate cigarette smoker (10-19 per day)   . H/O thyroid nodule     Benign    Past Surgical History  Procedure Laterality Date  . Cardiac catheterization  November 2012    For anterior STEMI/cardiac arrest -- 100% mid LAD occlusion  . Coronary angioplasty with stent placement  November 2012     mid LAD PCI --> Promus Element DES 3.5 mm at 24 mm (3.72 mm)  . Transthoracic echocardiogram  November 2012    EF 40-45%, moderate at K. of mid and distal inferior septum and anterior apical myocardium. Grade 1 diastolic function. -- Followup echocardiogram to reassess his EF was denied by insurance company  . Left heart catheterization with coronary angiogram N/A 08/27/2011    Procedure: LEFT HEART CATHETERIZATION WITH CORONARY ANGIOGRAM;  Surgeon: Troy Sine, MD;  Location: [facility] CATH LAB;  Service: Cardiovascular;  Laterality: N/A;  . Percutaneous coronary stent intervention (pci-s) N/A 08/27/2011    Procedure: PERCUTANEOUS CORONARY STENT INTERVENTION (PCI-S);  Surgeon: Troy Sine, MD;  Location: [facility] CATH LAB;  Service: Cardiovascular;  Laterality: N/A;  . Abdominal aortagram N/A 08/27/2011    Procedure: ABDOMINAL Maxcine Ham;  Surgeon: Troy Sine, MD;  Location: [facility] CATH LAB;  Service: Cardiovascular;  Laterality: N/A;  Prior to Admission medications   Medication Sig Start Date End Date Taking? Authorizing Provider  atorvastatin (LIPITOR) 40 MG tablet TAKE 1 TABLET AT BEDTIME (REPLACES CRESTOR) 08/04/14  Yes Leonie Man, MD  carvedilol (COREG) 6.25 MG tablet TAKE 1 TABLET (6.25 MG TOTAL) BY MOUTH 2 (TWO) TIMES DAILY WITH A MEAL. 08/04/14  Yes Leonie Man, MD  DENTA 5000 PLUS 1.1 % CREA dental cream  Place 1 application onto teeth at bedtime.  06/11/14  Yes Historical Provider, MD  lisinopril (PRINIVIL,ZESTRIL) 2.5 MG tablet Take 1 tablet (2.5 mg total) by mouth daily. 09/16/14  Yes Leonie Man, MD  NITROSTAT 0.4 MG SL tablet PLACE 1 TABLET UNDER THE TONGUE EVERY 5 MINUTES AS NEEDED FOR CHEST PAIN. 07/26/14  Yes Leonie Man, MD  ticagrelor (BRILINTA) 90 MG TABS tablet Take 1 tablet (90 mg total) by mouth 2 (two) times daily. MUST KEEP APPOINTMENT 08/01/15 WITH DR Ellyn Hack FOR FUTURE REFILLS 07/29/15  Yes Leonie Man, MD  traZODone (DESYREL) 100 MG tablet Take by mouth at bedtime. Dose unknown    Yes Historical Provider, MD   No Known Allergies   Social History   Social History  . Marital Status: Single    Spouse Name: N/A  . Number of Children: N/A  . Years of Education: N/A   Social History Main Topics  . Smoking status: Former Smoker -- 1.00 packs/day for 40 years    Types: Cigarettes    Quit date: 08/27/2011  . Smokeless tobacco: None  . Alcohol Use: No  . Drug Use: No  . Sexual Activity: Not Asked   Other Topics Concern  . None   Social History Narrative   Single mother of one, grandmother of 2. She works for [employer] for [employer].   She quit smoking time of her MI in November 2012. Does not take alcohol.   She had been doing really well he exercises at least 3-4 days a week, but really get discouraged that she is not been able to drop weight.   Family History  Problem Relation Age of Onset  . Cancer Mother   . Hypertension Mother   . Heart failure Father     Wt Readings from Last 3 Encounters:  08/01/15 185 lb (83.915 kg)  07/23/14 187 lb 9.6 oz (85.095 kg)  07/16/13 185 lb 3.2 oz (84.006 kg)    PHYSICAL EXAM BP 104/62 mmHg  Pulse 73  Ht 5\' 1"  (1.549 m)  Wt 185 lb (83.915 kg)  BMI 34.97 kg/m2 General appearance: alert, cooperative, appears stated age, no distress and midly obese  Neck: no adenopathy, no carotid bruit and  no JVD  Lungs: clear to auscultation bilaterally, normal percussion bilaterally and non-labored  Heart: regular rate and rhythm, S1, S2 normal, no murmur, click, rub or gallop  Abdomen: soft, non-tender; bowel sounds normal; no masses, no organomegaly; no HJR  Extremities: extremities normal, atraumatic, no cyanosis, and edema, mild varicose veins  Pulses: 2+ and symmetric;  Neurologic: Mental status: Alert, oriented, thought content appropriate  Cranial nerves: normal (II-XII grossly intact)   Adult ECG Report not checked  Other studies Reviewed: Additional studies/ records that were reviewed today include:  Recent Labs: 6/3/21016  TC 154, TG 116, HDL 39, LDL 79.   ASSESSMENT / PLAN: Problem List Items Addressed This Visit    Obesity (BMI 30-39.9) (Chronic)    The patient understands the need to lose weight with diet and exercise. We have discussed  specific strategies for this.      H/O Anterior STEMI:  Proximal LAD  insertion of a 3.5x24 mm Promus element DES (08/2011) (Chronic)    Very scary initial presentation - mild-moderately reduced EF immediately post MI (f/u echo ordered to recheck EF was declined by insurance company- inexplicable). No recurrent symptoms of angina or tachyarrhythmia.  Suspect VT was completely ischemic.      Relevant Medications   nitroGLYCERIN (NITROSTAT) 0.4 MG SL tablet   Glucose intolerance (impaired glucose tolerance) (Chronic)    Followed by PCP -- was supposed to have HgbA1c ordered - not drawn.  Defer to PCP. Discussed dietary modifications.      Essential hypertension (Chronic)    Stable - to borderline hypotension on current Rx - OK to hold ACE-I if not feeling well with poor PO intake or illness. Continue BB as her presenting event was with VT.      Relevant Medications   nitroGLYCERIN (NITROSTAT) 0.4 MG SL tablet   Dyslipidemia, goal LDL below 70 (Chronic)    Close to goal LDL on recent labs on moderate dose Atorvastatin --  if no further reduction, would add Zetia vs change to Crestor.      Relevant Medications   nitroGLYCERIN (NITROSTAT) 0.4 MG SL tablet   CAD S/P percutaneous coronary angioplasty - Primary (Chronic)    Long Mid LAD DES stent -- on Brilinta alone monotherapy after ASA stopped for bruising. Tried to reduce dose to 60mg  - pharmacy did not carry that dose. On Statin, BB & ACE-I.      Relevant Medications   nitroGLYCERIN (NITROSTAT) 0.4 MG SL tablet      Current medicines are reviewed at length with the patient today. (+/- concerns) none  The following changes have been made: none  No changes at present   if you feel a little dizzy in the morning ,you may take lisinopril it after lunch.  Your physician wants you to follow-up in 12 months with Dr Ellyn Hack.   Studies Ordered:   No orders of the defined types were placed in this encounter.     Leonie Man, M.D., M.S. Interventional Cardiologist   Pager # [phone]

## 2015-08-03 ENCOUNTER — Encounter: Payer: Self-pay | Admitting: Cardiology

## 2015-08-03 NOTE — Assessment & Plan Note (Signed)
Close to goal LDL on recent labs on moderate dose Atorvastatin -- if no further reduction, would add Zetia vs change to Crestor.

## 2015-08-03 NOTE — Assessment & Plan Note (Signed)
Stable - to borderline hypotension on current Rx - OK to hold ACE-I if not feeling well with poor PO intake or illness. Continue BB as her presenting event was with VT.

## 2015-08-03 NOTE — Assessment & Plan Note (Signed)
Followed by PCP -- was supposed to have HgbA1c ordered - not drawn.  Defer to PCP. Discussed dietary modifications.

## 2015-08-03 NOTE — Assessment & Plan Note (Signed)
The patient understands the need to lose weight with diet and exercise. We have discussed specific strategies for this.  

## 2015-08-03 NOTE — Assessment & Plan Note (Signed)
Very scary initial presentation - mild-moderately reduced EF immediately post MI (f/u echo ordered to recheck EF was declined by insurance company- inexplicable). No recurrent symptoms of angina or tachyarrhythmia.  Suspect VT was completely ischemic.

## 2015-08-03 NOTE — Assessment & Plan Note (Signed)
Long Mid LAD DES stent -- on Brilinta alone monotherapy after ASA stopped for bruising. Tried to reduce dose to 60mg  - pharmacy did not carry that dose. On Statin, BB & ACE-I.

## 2015-08-12 ENCOUNTER — Other Ambulatory Visit: Payer: Self-pay | Admitting: Cardiology

## 2015-08-24 ENCOUNTER — Other Ambulatory Visit: Payer: Self-pay | Admitting: Cardiology

## 2015-08-31 ENCOUNTER — Other Ambulatory Visit: Payer: Self-pay | Admitting: Cardiology

## 2015-08-31 NOTE — Telephone Encounter (Signed)
Rx(s) sent to pharmacy electronically.  

## 2015-12-26 ENCOUNTER — Other Ambulatory Visit: Payer: Self-pay

## 2015-12-26 DIAGNOSIS — Z1231 Encounter for screening mammogram for malignant neoplasm of breast: Secondary | ICD-10-CM

## 2016-01-13 ENCOUNTER — Ambulatory Visit
Admission: RE | Admit: 2016-01-13 | Discharge: 2016-01-13 | Disposition: A | Discharge status: Home / Self Care | Payer: 59 | Source: Ambulatory Visit

## 2016-01-13 DIAGNOSIS — Z1231 Encounter for screening mammogram for malignant neoplasm of breast: Secondary | ICD-10-CM

## 2016-04-11 ENCOUNTER — Other Ambulatory Visit: Payer: Self-pay | Admitting: Cardiology

## 2016-04-11 NOTE — Telephone Encounter (Signed)
Rx(s) sent to pharmacy electronically.  

## 2016-07-18 ENCOUNTER — Other Ambulatory Visit: Payer: Self-pay | Admitting: Cardiology

## 2016-08-15 ENCOUNTER — Other Ambulatory Visit: Payer: Self-pay | Admitting: Cardiology

## 2016-08-21 ENCOUNTER — Encounter: Payer: Self-pay | Admitting: Cardiology

## 2016-08-24 ENCOUNTER — Encounter: Payer: Self-pay | Admitting: Cardiology

## 2016-08-24 ENCOUNTER — Ambulatory Visit (INDEPENDENT_AMBULATORY_CARE_PROVIDER_SITE_OTHER): Payer: 59 | Admitting: Cardiology

## 2016-08-24 VITALS — BP 114/68 | HR 78 | Ht 61.0 in | Wt 195.2 lb

## 2016-08-24 DIAGNOSIS — E785 Hyperlipidemia, unspecified: Secondary | ICD-10-CM

## 2016-08-24 DIAGNOSIS — E669 Obesity, unspecified: Secondary | ICD-10-CM | POA: Diagnosis not present

## 2016-08-24 DIAGNOSIS — I2109 ST elevation (STEMI) myocardial infarction involving other coronary artery of anterior wall: Secondary | ICD-10-CM

## 2016-08-24 DIAGNOSIS — I2510 Atherosclerotic heart disease of native coronary artery without angina pectoris: Secondary | ICD-10-CM

## 2016-08-24 DIAGNOSIS — Z9861 Coronary angioplasty status: Secondary | ICD-10-CM

## 2016-08-24 DIAGNOSIS — I10 Essential (primary) hypertension: Secondary | ICD-10-CM

## 2016-08-24 NOTE — Patient Instructions (Signed)
No changes with current medications    Your physician wants you to follow-up in: 12 months with Dr Ellyn Hack. You will receive a reminder letter in the mail two months in advance. If you don't receive a letter, please call our office to schedule the follow-up appointment.   If you need a refill on your cardiac medications before your next appointment, please call your pharmacy.

## 2016-08-24 NOTE — Assessment & Plan Note (Signed)
All she had a quite scary initial presentation with her anterior MI and cardiac arrest. She has not had any further complications since. She has not had any heart failure or any further anginal symptoms. Unfortunately relook echo was declined by her insurance company which I can understand. Her EF at that time of her MI was roughly 40-45%. I will suspect that she is higher than that. She is on stable dose of beta blocker, ACE inhibitor, statin and Brilinta.

## 2016-08-24 NOTE — Assessment & Plan Note (Signed)
She has an extensive stented segment in the LAD. Brilinta monotherapy (again explicitly for some reason her cost for the 60 mg Brilinta was going to be more expensive and the pharmacy would not cover)   Plan: continue at Brilinta 90 mg twice a day for now. Okay to hold for procedures or for any bleeding issues. Continue current dose of statin, beta blocker and ACE inhibitor

## 2016-08-24 NOTE — Progress Notes (Addendum)
PCP: Gara Kroner, MD  Clinic Note: Chief Complaint  Patient presents with  . Follow-up  . Coronary Artery Disease    Status post anterior MI with LAD PCI    HPI: Samantha Mcbride is a 65 y.o. female with a PMH below who presents today for annual follow-up for her CAD and cardiac risk factors. She had an anterior MI with PCI to the LAD back in November 2012.  Samantha Mcbride was last seen in October 2016. She is doing fairly well at that time. Noted less frequent panic attacks/rapid heart rates. She was able to calm these down with breathing exercises. Was working on avoiding social stresses. She noted that she doesn't do routine exercise, but does routine yardwork and house chores.  Recent Hospitalizations: None  Studies Reviewed: None   Interval History: Samantha Mcbride doing quite well. She is not had any further anginal symptoms with rest or exertion. No resting or exertional dyspnea. She just simply notes that she has not had time to get back into her exercise routine. She is concerned that she may have gained a little weight.   Deconditioned -- lots of stress with having her daughter & family living with them -- until their house is done.  Not enough time to go to the gym.  Essentially negative cardiac review of symptoms: No chest pain or shortness of breath with rest or exertion. No PND, orthopnea or edema. No palpitations, lightheadedness, dizziness, weakness or syncope/near syncope. No TIA/amaurosis fugax symptoms. No melena, hematochezia, hematuria, or epstaxis. No claudication.  ROS: A comprehensive was performed. Review of Systems  Constitutional: Negative for chills, fever and malaise/fatigue (deconditioned).  HENT: Positive for congestion (with cold).   Respiratory: Positive for cough (starting to developed a cold). Negative for shortness of breath and wheezing.   Cardiovascular: Negative.        Per PCP  Gastrointestinal: Negative for blood in stool.  Genitourinary: Positive  for frequency. Negative for dysuria, hematuria and urgency.  Musculoskeletal: Positive for back pain (better). Negative for joint pain and myalgias.  Skin: Negative.   Neurological: Negative for dizziness and loss of consciousness.  Endo/Heme/Allergies: Negative for environmental allergies. Does not bruise/bleed easily (better off of ASA).  Psychiatric/Behavioral: Negative for depression and memory loss. The patient is not nervous/anxious and does not have insomnia.    Past Medical History:  Diagnosis Date  . CAD S/P percutaneous coronary angioplasty November 2012   Promus DES - mid LAD 3.5 mm x 24 mm (3.72 mm)   . Dyslipidemia, goal LDL below 70     on statin, close to goal  . Former moderate cigarette smoker (10-19 per day)   . Glucose intolerance (impaired glucose tolerance)   . H/O thyroid nodule    Benign  . History of ST elevation myocardial infarction (STEMI) of anterior wall November 2012   With cardiac arrest, 100% mobility occlusion. --> Promus DES   . Hypertension     Past Surgical History:  Procedure Laterality Date  . ABDOMINAL AORTAGRAM N/A 08/27/2011   Procedure: ABDOMINAL Maxcine Ham;  Surgeon: Troy Sine, MD;  Location: [facility] CATH LAB;  Service: Cardiovascular;  Laterality: N/A;  . CARDIAC CATHETERIZATION  November 2012   For anterior STEMI/cardiac arrest -- 100% mid LAD occlusion  . CORONARY ANGIOPLASTY WITH STENT PLACEMENT  November 2012    mid LAD PCI --> Promus Element DES 3.5 mm at 24 mm (3.72 mm)  . LEFT HEART CATHETERIZATION WITH CORONARY ANGIOGRAM N/A 08/27/2011  Procedure: LEFT HEART CATHETERIZATION WITH CORONARY ANGIOGRAM;  Surgeon: Troy Sine, MD;  Location: [facility] CATH LAB;  Service: Cardiovascular;  Laterality: N/A;  . PERCUTANEOUS CORONARY STENT INTERVENTION (PCI-S) N/A 08/27/2011   Procedure: PERCUTANEOUS CORONARY STENT INTERVENTION (PCI-S);  Surgeon: Troy Sine, MD;  Location: [facility] CATH LAB;  Service: Cardiovascular;  Laterality: N/A;  .  TRANSTHORACIC ECHOCARDIOGRAM  November 2012   EF 40-45%, moderate at K. of mid and distal inferior septum and anterior apical myocardium. Grade 1 diastolic function. -- Followup echocardiogram to reassess his EF was denied by insurance company    Prior to Admission medications   Medication Sig Start Date Taking? Authorizing Provider  atorvastatin (LIPITOR) 40 MG tablet Take 1 tablet (40 mg total) by mouth at bedtime. 07/18/16 Yes Leonie Man, MD  carvedilol (COREG) 6.25 MG tablet TAKE 1 TABLET (6.25 MG TOTAL) BY MOUTH 2 (TWO) TIMES DAILY WITH A MEAL. 07/18/16 Yes Leonie Man, MD  lisinopril (PRINIVIL,ZESTRIL) 2.5 MG tablet Take 1 tablet (2.5 mg total) by mouth daily. 08/15/16 Yes Leonie Man, MD  nitroGLYCERIN (NITROSTAT) 0.4 MG SL tablet PLACE 1 TABLET UNDER THE TONGUE EVERY 5 MINUTES AS NEEDED FOR CHEST PAIN. 08/01/15 Yes Leonie Man, MD  ticagrelor (BRILINTA) 90 MG TABS tablet Take 1 tablet (90 mg total) by mouth 2 (two) times daily. 08/15/16 Yes Leonie Man, MD  traZODone (DESYREL) 100 MG tablet Take by mouth at bedtime. Dose unknown   Yes Historical Provider, MD    No Known Allergies   Social History   Social History  . Marital status: Single    Spouse name: N/A  . Number of children: N/A  . Years of education: N/A   Social History Main Topics  . Smoking status: Former Smoker    Packs/day: 1.00    Years: 40.00    Types: Cigarettes    Quit date: 08/27/2011  . Smokeless tobacco: Never Used  . Alcohol use No  . Drug use: No  . Sexual activity: Not Asked   Other Topics Concern  . None   Social History Narrative   Single mother of one, grandmother of 2. She works for [employer] for [employer].   She quit smoking time of her MI in November 2012. Does not take alcohol.   She had been doing really well he exercises at least 3-4 days a week, but really get discouraged that she is not been able to drop weight.    Family History  Problem  Relation Age of Onset  . Cancer Mother   . Hypertension Mother   . Heart failure Father     Wt Readings from Last 3 Encounters:  08/24/16 88.5 kg (195 lb 3.2 oz)  08/01/15 83.9 kg (185 lb)  07/23/14 85.1 kg (187 lb 9.6 oz)    PHYSICAL EXAM BP 114/68   Pulse 78   Ht 5\' 1"  (1.549 m)   Wt 88.5 kg (195 lb 3.2 oz)   BMI 36.88 kg/m  General appearance: alert, cooperative, appears stated age, no distress and midly obese  Neck: no adenopathy, no carotid bruit and no JVD  Lungs: clear to auscultation bilaterally, normal percussion bilaterally and non-labored  Heart: regular rate and rhythm, S1, S2 normal, no murmur, click, rub or gallop  Abdomen: soft, non-tender; bowel sounds normal; no masses, no organomegaly; no HJR  Extremities: extremities normal, atraumatic, no cyanosis, and edema, mild varicose veins  Pulses: 2+ and symmetric;  Neurologic: Mental status: Alert, oriented, thought  content appropriate  Cranial nerves: normal (II-XII grossly intact)    Adult ECG Report  Rate: 78 ;  Rhythm: normal sinus rhythm; Septal MI, age undetermined. Normal axis,intervals & durations. Low voltage  Narrative Interpretation: Otherwise normal / stable EKG  Other studies Reviewed: Additional studies/ records that were reviewed today include:  Recent Labs:  Checked in June by PCP   ASSESSMENT / PLAN: Problem List Items Addressed This Visit    H/O Anterior STEMI:  Proximal LAD  insertion of a 3.5x24 mm Promus element DES (08/2011) (Chronic)    All she had a quite scary initial presentation with her anterior MI and cardiac arrest. She has not had any further complications since. She has not had any heart failure or any further anginal symptoms. Unfortunately relook echo was declined by her insurance company which I can understand. Her EF at that time of her MI was roughly 40-45%. I will suspect that she is higher than that. She is on stable dose of beta blocker, ACE inhibitor, statin and  Brilinta.      Relevant Orders   EKG 12-Lead   CAD S/P percutaneous coronary angioplasty - Primary (Chronic)    She has an extensive stented segment in the LAD. Brilinta monotherapy (again explicitly for some reason her cost for the 60 mg Brilinta was going to be more expensive and the pharmacy would not cover)   Plan: continue at Brilinta 90 mg twice a day for now. Okay to hold for procedures or for any bleeding issues. Continue current dose of statin, beta blocker and ACE inhibitor      Relevant Orders   EKG 12-Lead   Dyslipidemia, goal LDL below 70 (Chronic)    On stable dose atorvastatin. Labs monitored by PCP. Unfortunately we do not have current lab results from this June. We'll try to get her PCP. Target LDL should be less than 70.      Relevant Orders   EKG 12-Lead   Essential hypertension (Chronic)    Well-controlled on beta blocker and ACE inhibitor. No further episodes of hypotension, dizziness.      Relevant Orders   EKG 12-Lead   Obesity (BMI 30-39.9) (Chronic)   Relevant Orders   EKG 12-Lead      Current medicines are reviewed at length with the patient today. (+/- concerns) n/a The following changes have been made: none (would like to change to Brilinta 60 mg but $$ was more ?)  Patient Instructions  No changes with current medications    Your physician wants you to follow-up in: 12 months with Dr Ellyn Hack. You will receive a reminder letter in the mail two months in advance. If you don't receive a letter, please call our office to schedule the follow-up appointment.   If you need a refill on your cardiac medications before your next appointment, please call your pharmacy.   Studies Ordered:   Orders Placed This Encounter  Procedures  . EKG 12-Lead     Samantha Mcbride, M.D., M.S. Interventional Cardiologist   Pager # [phone] Phone # [phone] [address] [place], [place] [address]   Labs from PCP received after the  patient was seen. Labs were from 04/06/2016   Na+ 142, K+ 4.3, Cl- 105, HCO3- 32 , BUN 16, Cr 1.1, Glu 100, Ca2+ 9.2; AST 13, ALT 12, AlkP 72, Alb 4.2, TP 6.8, T Bili 0.6  TC 161, TG 159, HDL 44, LDL 85 -- pretty well-controlled. Would just simply continue current prescription  Samantha Hew, MD

## 2016-08-24 NOTE — Assessment & Plan Note (Signed)
On stable dose atorvastatin. Labs monitored by PCP. Unfortunately we do not have current lab results from this June. We'll try to get her PCP. Target LDL should be less than 70.

## 2016-08-24 NOTE — Assessment & Plan Note (Signed)
Well-controlled on beta blocker and ACE inhibitor. No further episodes of hypotension, dizziness.

## 2016-09-12 ENCOUNTER — Other Ambulatory Visit: Payer: Self-pay | Admitting: Cardiology

## 2017-01-07 ENCOUNTER — Other Ambulatory Visit: Payer: Self-pay | Admitting: Obstetrics and Gynecology

## 2017-01-07 DIAGNOSIS — Z1231 Encounter for screening mammogram for malignant neoplasm of breast: Secondary | ICD-10-CM

## 2017-01-25 ENCOUNTER — Ambulatory Visit
Admission: RE | Admit: 2017-01-25 | Discharge: 2017-01-25 | Disposition: A | Discharge status: Home / Self Care | Payer: 59 | Source: Ambulatory Visit | Attending: Obstetrics and Gynecology | Admitting: Obstetrics and Gynecology

## 2017-01-25 DIAGNOSIS — Z1231 Encounter for screening mammogram for malignant neoplasm of breast: Secondary | ICD-10-CM

## 2017-08-12 ENCOUNTER — Other Ambulatory Visit: Payer: Self-pay | Admitting: Cardiology

## 2017-08-14 ENCOUNTER — Other Ambulatory Visit: Payer: Self-pay | Admitting: Cardiology

## 2017-08-14 NOTE — Telephone Encounter (Signed)
REFILL 

## 2017-08-26 ENCOUNTER — Ambulatory Visit: Payer: Medicare Other | Admitting: Cardiology

## 2017-08-26 ENCOUNTER — Encounter: Payer: Self-pay | Admitting: Cardiology

## 2017-08-26 VITALS — Ht 61.0 in | Wt 192.4 lb

## 2017-08-26 DIAGNOSIS — E785 Hyperlipidemia, unspecified: Secondary | ICD-10-CM

## 2017-08-26 DIAGNOSIS — I2109 ST elevation (STEMI) myocardial infarction involving other coronary artery of anterior wall: Secondary | ICD-10-CM

## 2017-08-26 DIAGNOSIS — I10 Essential (primary) hypertension: Secondary | ICD-10-CM | POA: Diagnosis not present

## 2017-08-26 DIAGNOSIS — Z9861 Coronary angioplasty status: Secondary | ICD-10-CM | POA: Diagnosis not present

## 2017-08-26 DIAGNOSIS — I2510 Atherosclerotic heart disease of native coronary artery without angina pectoris: Secondary | ICD-10-CM

## 2017-08-26 DIAGNOSIS — E669 Obesity, unspecified: Secondary | ICD-10-CM

## 2017-08-26 MED ORDER — CLOPIDOGREL BISULFATE 75 MG PO TABS: 75.0000 mg | ORAL_TABLET | Freq: Every day | ORAL | 3 refills | Status: DC

## 2017-08-26 NOTE — Assessment & Plan Note (Signed)
Complicated by VF arrest in ER - > no further complication. Mild-Mod Ischemic CM with EF ~40-45% -- she does have some mild DOE, but no further angina or HF Sx. No SSx of arrhythmia & no syncope/near syncope.  On stable meds - converting to Plavix from Brilinta.

## 2017-08-26 NOTE — Progress Notes (Addendum)
PCP: Antony Contras, MD  Clinic Note: Chief Complaint  Patient presents with  . Follow-up  . Shortness of Breath    walking a long distance.     HPI: Samantha Mcbride is a 66 y.o. female with a PMH below who presents today for annual follow-up for CAD and cardiac risk factors..  She had an anterior MI with PCI to the LAD back in November 2012. Samantha Mcbride was last seen on August 24, 2016.  She was doing well at that time.  Simply deconditioned, and under lots of stress having family living with her.  This is caused her to not be able to do the exercise she likes to do.  Recent Hospitalizations: None  Studies Personally Reviewed - (if available, images/films reviewed: From Epic Chart or Care Everywhere)  None  Interval History:  Samantha Mcbride presents today mostly troubled by her back and knee pain on the left side.  This limits her walking and she does notice some dyspnea if she overexerts.  It is probably more related to her pain then and is to any heart failure symptoms. She denies any chest tightness or pressure with rest or exertion.  No PND, orthopnea with only trace edema. Unfortunately, with her back and knee pain, she has not really been doing much exercise.  This in addition to the fact that she was on prednisone has led to her gaining quite a bit of weight.  She is now more deconditioning than she had previously been.  No palpitations, lightheadedness, dizziness, weakness or syncope/near syncope. No TIA/amaurosis fugax symptoms. No melena, hematochezia, hematuria, or epstaxis. No claudication.  Recently diagnosed with Rheumatoid Arthritis - was on Prednisone x ~ 1 month (gained a lot of wgt). Now on MTX & Folic Acid.    Also limited by back pain-sciatica & L Knee pain.  ROS: A comprehensive was performed. Review of Systems  Constitutional: Positive for malaise/fatigue (recent Dx of RA & L hip/knee pain with sciatica.).  HENT: Negative for congestion and nosebleeds.     Respiratory: Positive for shortness of breath (if she over-exerts).   Cardiovascular: Negative.   Gastrointestinal: Negative for blood in stool and melena.  Genitourinary: Negative for hematuria.  Musculoskeletal: Positive for back pain (with L-side sciatica), joint pain (L hip & knee) and myalgias.  Neurological: Negative for dizziness and focal weakness.  Psychiatric/Behavioral: Negative for depression and memory loss. The patient is not nervous/anxious and does not have insomnia.   All other systems reviewed and are negative.   I have reviewed and (if needed) personally updated the patient's problem list, medications, allergies, past medical and surgical history, social and family history.   Past Medical History:  Diagnosis Date  . CAD S/P percutaneous coronary angioplasty November 2012   Promus DES - mid LAD 3.5 mm x 24 mm (3.72 mm)   . Dyslipidemia, goal LDL below 70     on statin, close to goal  . Former moderate cigarette smoker (10-19 per day)   . Glucose intolerance (impaired glucose tolerance)   . H/O thyroid nodule    Benign  . History of ST elevation myocardial infarction (STEMI) of anterior wall November 2012   With cardiac arrest, 100% mobility occlusion. --> Promus DES   . Hypertension     Past Surgical History:  Procedure Laterality Date  . BREAST EXCISIONAL BIOPSY    . CARDIAC CATHETERIZATION  November 2012   For anterior STEMI/cardiac arrest -- 100% mid LAD occlusion  . CORONARY  ANGIOPLASTY WITH STENT PLACEMENT  November 2012    mid LAD PCI --> Promus Element DES 3.5 mm at 24 mm (3.72 mm)  . TRANSTHORACIC ECHOCARDIOGRAM  November 2012   EF 40-45%, moderate at K. of mid and distal inferior septum and anterior apical myocardium. Grade 1 diastolic function. -- Followup echocardiogram to reassess his EF was denied by insurance company    Current Meds  Medication Sig  . atorvastatin (LIPITOR) 40 MG tablet TAKE 1 TABLET BY MOUTH AT BEDTIME  . carvedilol (COREG)  6.25 MG tablet TAKE 1 TABLET BY MOUTH TWICE A DAY WITH A MEAL  . diclofenac (VOLTAREN) 0.1 % ophthalmic solution every 6 (six) hours.  . folic acid (FOLVITE) 1 MG tablet Take 1 mg daily by mouth.  Marland Kitchen lisinopril (PRINIVIL,ZESTRIL) 2.5 MG tablet TAKE 1 TABLET BY MOUTH EVERY DAY  . methotrexate (RHEUMATREX) 2.5 MG tablet TAKE 4 TABLETS BY MOUTH ONCE A WEEK  . nitroGLYCERIN (NITROSTAT) 0.4 MG SL tablet PLACE 1 TABLET UNDER THE TONGUE EVERY 5 MINUTES AS NEEDED FOR CHEST PAIN.  Marland Kitchen ticagrelor (BRILINTA) 90 MG TABS tablet Take 1 tablet (90 mg total) by mouth 2 (two) times daily. KEEP OV.  . traZODone (DESYREL) 100 MG tablet Take by mouth at bedtime. Dose unknown     No Known Allergies  Social History   Socioeconomic History  . Marital status: Single    Spouse name: None  . Number of children: None  . Years of education: None  . Highest education level: None  Social Needs  . Financial resource strain: None  . Food insecurity - worry: None  . Food insecurity - inability: None  . Transportation needs - medical: None  . Transportation needs - non-medical: None  Occupational History  . None  Tobacco Use  . Smoking status: Former Smoker    Packs/day: 1.00    Years: 40.00    Pack years: 40.00    Types: Cigarettes    Last attempt to quit: 08/27/2011    Years since quitting: 6.0  . Smokeless tobacco: Never Used  Substance and Sexual Activity  . Alcohol use: No  . Drug use: No  . Sexual activity: None  Other Topics Concern  . None  Social History Narrative   Single mother of one, grandmother of 2. She works for [employer] for [employer].   She quit smoking time of her MI in November 2012. Does not take alcohol.   She had been doing really well he exercises at least 3-4 days a week, but really get discouraged that she is not been able to drop weight.    family history includes Breast cancer in her mother and sister; Cancer in her mother; Heart failure in her father;  Hypertension in her mother.  Wt Readings from Last 3 Encounters:  08/26/17 192 lb 6.4 oz (87.3 kg)  08/24/16 195 lb 3.2 oz (88.5 kg)  08/01/15 185 lb (83.9 kg)  - gained wgt while on Prednisone x 2 months.    PHYSICAL EXAM Ht 5\' 1"  (1.549 m)   Wt 192 lb 6.4 oz (87.3 kg)   BMI 36.35 kg/m  Physical Exam  Constitutional: She is oriented to person, place, and time. She appears well-developed and well-nourished. No distress.  HENT:  Head: Normocephalic and atraumatic.  Neck: No hepatojugular reflux (cannot assess) and no JVD (difficult to assess) present. Carotid bruit is not present.  Cardiovascular: Normal rate, regular rhythm, normal heart sounds and normal pulses.  No extrasystoles are  present. Exam reveals no gallop and no friction rub.  No murmur heard. Pulmonary/Chest: Effort normal and breath sounds normal. No respiratory distress. She has no wheezes. She has no rales.  Abdominal: Soft. Bowel sounds are normal. She exhibits no distension. There is no tenderness. There is no rebound.  Musculoskeletal: Normal range of motion. She exhibits no edema.  Neurological: She is alert and oriented to person, place, and time.  Skin: Skin is warm and dry. No rash noted. No erythema.  Psychiatric: She has a normal mood and affect. Her behavior is normal. Judgment and thought content normal.  Nursing note and vitals reviewed.   Adult ECG Report  Rate: 72 ;  Rhythm: normal sinus rhythm and Low voltage.  Septal MI, age undetermined.  Subtle ST-T wave abnormalities.  Consider anterior ischemia.;   Narrative Interpretation: Stable EKG  As other studies Reviewed: Additional studies/ records that were reviewed today include:  Recent Labs:  Checked in July is not in care everywhere it   ASSESSMENT / PLAN: Problem List Items Addressed This Visit    CAD S/P percutaneous coronary angioplasty (Chronic)    She is now 6 yrs out from Emergent LAD PCI for Ant STEMI with cardiac arrest.   No further  angina Sx.    Plan:  Convert to Plavix alone -- consider ASA with low dose Xarelto (pending further vetting of data). Continue current dose of Beta blocker & ACE-Inhibitor along with statin.      Relevant Orders   EKG 12-Lead   Dyslipidemia, goal LDL below 70 (Chronic)    On stable dose of atorvastatin - unfortunately, labs are monitored by PCP & not available.   Target LDL is less than 70.      Relevant Orders   EKG 12-Lead   Essential hypertension (Chronic)    Well-controlled on current medications.  No change to current dose of carvedilol and lisinopril.      H/O Anterior STEMI:  Proximal LAD  insertion of a 3.5x24 mm Promus element DES (08/2011) - Primary (Chronic)    Complicated by VF arrest in ER - > no further complication. Mild-Mod Ischemic CM with EF ~40-45% -- she does have some mild DOE, but no further angina or HF Sx. No SSx of arrhythmia & no syncope/near syncope.  On stable meds - converting to Plavix from Brilinta.      Relevant Orders   EKG 12-Lead   Obesity (BMI 30-39.9) (Chronic)    The patient understands the need to lose weight with diet and exercise. We have discussed specific strategies for this.         Current medicines are reviewed at length with the patient today. (+/- concerns) n/a The following changes have been made: change to Plavix.   Patient Instructions   Completed taking the [place] --START TAKING CLOPIDOGREL 75 MG ONE TABLET DAILY    Your physician wants you to follow-up in [place]. You will receive a reminder letter in the mail two months in advance. If you don't receive a letter, please call our office to schedule the follow-up appointment.    If you need a refill on your cardiac medications before your next appointment, please call your pharmacy.    Studies Ordered:   Orders Placed This Encounter  Procedures  . EKG 12-Lead      Glenetta Hew, M.D., M.S. Interventional Cardiologist    Pager # [phone] Phone # [phone] [place], [place]  [address]   Recent Labs: May 14, 2027  Na+ 142, K+ 4.3, Cl- 106, HCO3-0.7, BUN 21, Cr 1.19, Glu 108, Ca2+ 9.4; AST 16, ALT, 17, AlkP 84,  T Bili 0.7, TP 7.1,Alb 2  TC 160, TG 208 (H), HDL 39, LDL 80 (goal less than 70)  --Recommend starting Zetia 10 mg daily  Glenetta Hew, MD

## 2017-08-26 NOTE — Assessment & Plan Note (Signed)
The patient understands the need to lose weight with diet and exercise. We have discussed specific strategies for this.  

## 2017-08-26 NOTE — Patient Instructions (Signed)
Completed taking the [place] --START TAKING CLOPIDOGREL 75 MG ONE TABLET DAILY    Your physician wants you to follow-up in [place]. You will receive a reminder letter in the mail two months in advance. If you don't receive a letter, please call our office to schedule the follow-up appointment.    If you need a refill on your cardiac medications before your next appointment, please call your pharmacy.

## 2017-08-26 NOTE — Assessment & Plan Note (Signed)
She is now 6 yrs out from Emergent LAD PCI for Ant STEMI with cardiac arrest.   No further angina Sx.    Plan:  Convert to Plavix alone -- consider ASA with low dose Xarelto (pending further vetting of data). Continue current dose of Beta blocker & ACE-Inhibitor along with statin.

## 2017-08-26 NOTE — Assessment & Plan Note (Signed)
On stable dose of atorvastatin - unfortunately, labs are monitored by PCP & not available.   Target LDL is less than 70.

## 2017-08-26 NOTE — Assessment & Plan Note (Signed)
Well-controlled on current medications.  No change to current dose of carvedilol and lisinopril.

## 2017-09-23 ENCOUNTER — Other Ambulatory Visit: Payer: Self-pay | Admitting: Cardiology

## 2017-10-06 ENCOUNTER — Other Ambulatory Visit: Payer: Self-pay | Admitting: Cardiology

## 2017-10-07 NOTE — Telephone Encounter (Signed)
REFILL 

## 2017-11-17 ENCOUNTER — Other Ambulatory Visit: Payer: Self-pay | Admitting: Cardiology

## 2017-11-18 NOTE — Telephone Encounter (Signed)
Rx request sent to pharmacy.  

## 2017-11-22 ENCOUNTER — Telehealth: Payer: Self-pay | Admitting: Cardiology

## 2017-11-22 NOTE — Telephone Encounter (Signed)
Reviewed medication list in chart.  No concerns for drug interactions with cardiac medications.

## 2017-11-22 NOTE — Telephone Encounter (Signed)
New Message:  Pt wants to know if she can take a Humaris Injection along with all the other medicine she takes?

## 2017-11-22 NOTE — Telephone Encounter (Signed)
Patient aware of advice

## 2017-11-22 NOTE — Telephone Encounter (Signed)
Returned call to patient of Dr. Ellyn Hack who would like to know if it is OK to take Humira injection for RA in conjunction with current medications. Will defer to MD/CVRR to review and advise

## 2018-02-06 ENCOUNTER — Telehealth: Payer: Self-pay

## 2018-02-06 NOTE — Telephone Encounter (Signed)
   Primary Cardiologist:David Ellyn Hack, MD  Chart reviewed as part of pre-operative protocol coverage. To reiterate, patient has h/o CAD/anterior MI with PCI to LAD 2012 with cardiac arrest, LV dysfunction EF 40-45% last assessed in 2012, HTN, dyslipidemia, former tobacco abuse. Last OV 08/2017 with Dr. Ellyn Hack at which time she was doing well -> plan to transition from Brilinta to Plavix with consideration of low dose Xarelto in the future pending further vetting of data. Patient was contacted 02/06/2018 in reference to pre-operative risk assessment for pending surgery as outlined below. She has done very well and denies any new complaints. No chest pain or angina. Able to perform 5 METS without limitation. Does admit to chronic DOE with higher levels of exertion but this is chronic for at least 7 years. Therefore, based on ACC/AHA guidelines, the patient would be at acceptable risk for the planned procedure without further cardiovascular testing.   The surgical clearance requested to come off both Plavix and Brilinta. She is no longer on Brilinta as above, but is on Plavix. It's not clear how long GI wants her to be off, but the patient did say GI did tell her they would feel more comfortable if she did start a baby aspirin for the time while she is off of Plavix which she plans to do.   Dr. Ellyn Hack reviewed this request and replied, "OK to hold Plavix 5-7 d pre-op.  Can be off 2-3 weeks (or longer by request) post-op if necessary."  Will route this bundled recommendation to requesting provider via Epic fax function. Please call with questions.   Charlie Pitter, PA-C 02/06/2018, 5:17 PM

## 2018-02-06 NOTE — Telephone Encounter (Signed)
OK to hold Plavix 5-7 d pre-op.  Can be off 2-3 weeks (or longer by request) post-op if necessary.  Glenetta Hew, MD

## 2018-02-06 NOTE — Telephone Encounter (Signed)
   Primary Cardiologist: Glenetta Hew, MD  Chart reviewed as part of pre-operative protocol coverage. Patient has h/o CAD/anterior MI with PCI to LAD 2012 with cardiac arrest, LV dysfunction EF 40-45% last assessed in 2012, HTN, dyslipidemia, former tobacco abuse. Last OV 08/2017 with Dr. Ellyn Hack at which time she was doing well -> plan to transition from Brilinta to Plavix with consideration of low dose Xarelto in the future pending further vetting of data. Patient was contacted 02/06/2018 in reference to pre-operative risk assessment for pending surgery as outlined below. She has done very well and denies any new complaints. No chest pain or angina. Able to perform 5 METS without limitation. Does admit to chronic DOE with higher levels of exertion but this is chronic for at least 7 years. Therefore, based on ACC/AHA guidelines, the patient would be at acceptable risk for the planned procedure without further cardiovascular testing.   The surgical clearance requested to come off both Plavix and Brilinta. She is no longer on Brilinta as above, but is on Plavix. I'll route this message to Dr. Ellyn Hack for final input on holding Plavix. It's not clear how long GI wants her to be off, but the patient did say GI did tell her they would feel more comfortable if she did start a baby aspirin for the time while she is off of Plavix. Dr. Ellyn Hack, please route your response to P CV DIV PREOP. Thanks.  Charlie Pitter, PA-C 02/06/2018, 4:38 PM

## 2018-02-06 NOTE — Telephone Encounter (Signed)
   [facility] Pre-operative Risk Assessment    Request for surgical clearance:  What type of surgery is being performed?  Colonoscopy   When is this surgery scheduled? 02/20/18   1. What type of clearance is required (medical clearance vs. Pharmacy clearance to hold med vs. Both)? Both  2. Are there any medications that need to be held prior to surgery and how long? Plavix and Brilinta   3. Practice name and name of physician performing surgery? [facility]  4. What is your office phone number [phone]   7.   What is your office fax number        [phone]  8.   Anesthesia type (None, local, MAC, general) ? Propofol   Samantha Mcbride 02/06/2018, 2:24 PM  _________________________________________________________________   (provider comments below)

## 2018-02-26 ENCOUNTER — Other Ambulatory Visit: Payer: Self-pay | Admitting: Cardiology

## 2018-03-12 ENCOUNTER — Other Ambulatory Visit: Payer: Self-pay | Admitting: Obstetrics and Gynecology

## 2018-03-12 DIAGNOSIS — Z1231 Encounter for screening mammogram for malignant neoplasm of breast: Secondary | ICD-10-CM

## 2018-04-04 ENCOUNTER — Ambulatory Visit
Admission: RE | Admit: 2018-04-04 | Discharge: 2018-04-04 | Disposition: A | Discharge status: Home / Self Care | Payer: Medicare Other | Source: Ambulatory Visit | Attending: Obstetrics and Gynecology | Admitting: Obstetrics and Gynecology

## 2018-04-04 DIAGNOSIS — Z1231 Encounter for screening mammogram for malignant neoplasm of breast: Secondary | ICD-10-CM

## 2018-04-27 ENCOUNTER — Other Ambulatory Visit: Payer: Self-pay | Admitting: Cardiology

## 2018-04-28 NOTE — Telephone Encounter (Signed)
Rx has been sent to the pharmacy electronically. ° °

## 2018-07-29 ENCOUNTER — Other Ambulatory Visit: Payer: Self-pay | Admitting: Cardiology

## 2018-08-01 ENCOUNTER — Other Ambulatory Visit: Payer: Self-pay | Admitting: Cardiology

## 2018-08-25 ENCOUNTER — Other Ambulatory Visit: Payer: Self-pay | Admitting: Family Medicine

## 2018-08-25 DIAGNOSIS — R945 Abnormal results of liver function studies: Principal | ICD-10-CM

## 2018-08-25 DIAGNOSIS — R7989 Other specified abnormal findings of blood chemistry: Secondary | ICD-10-CM

## 2018-08-27 ENCOUNTER — Other Ambulatory Visit: Payer: Self-pay | Admitting: Cardiology

## 2018-08-29 ENCOUNTER — Encounter: Payer: Self-pay | Admitting: Cardiology

## 2018-08-29 ENCOUNTER — Ambulatory Visit: Payer: Medicare Other | Admitting: Cardiology

## 2018-08-29 VITALS — BP 118/72 | HR 84 | Ht 61.0 in | Wt 186.8 lb

## 2018-08-29 DIAGNOSIS — I2510 Atherosclerotic heart disease of native coronary artery without angina pectoris: Secondary | ICD-10-CM | POA: Diagnosis not present

## 2018-08-29 DIAGNOSIS — I2109 ST elevation (STEMI) myocardial infarction involving other coronary artery of anterior wall: Secondary | ICD-10-CM

## 2018-08-29 DIAGNOSIS — E785 Hyperlipidemia, unspecified: Secondary | ICD-10-CM

## 2018-08-29 DIAGNOSIS — I10 Essential (primary) hypertension: Secondary | ICD-10-CM

## 2018-08-29 DIAGNOSIS — Z9861 Coronary angioplasty status: Secondary | ICD-10-CM | POA: Diagnosis not present

## 2018-08-29 DIAGNOSIS — E669 Obesity, unspecified: Secondary | ICD-10-CM

## 2018-08-29 NOTE — Assessment & Plan Note (Signed)
Has been on Atorvastatin for years - LDL still ~87 this year & statin currently on hold due to elevated LFTs --> will wait until RUQ US done to ensure no hepatic condition to restart --> will take advantage of this break to have her f/u with our clinical pharmacists in [facility] ([facility])

## 2018-08-29 NOTE — Progress Notes (Signed)
PCP: Antony Contras, MD  Clinic Note: Chief Complaint  Patient presents with  . Follow-up    Annual follow-up  . Coronary Artery Disease    HPI: Samantha Mcbride is a 67 y.o. female with a PMH notable for h/o Anterior STEMI complicated by Cardiac Arrest-PCI LAD with CRFs noted below who presents today for annual follow-up.  Anterior STEMI with PCI to the LAD back in November 2012. - DES PCI-LAD. Samantha Mcbride was last seen in November 2018. She was doing well from Cardiac standpoint.   Recently diagnosed with Rheumatoid Arthritis - was on Prednisone x ~ 1 month (gained a lot of wgt). Now on MTX & Folic Acid.   Also limited by back pain-sciatica & L Knee pain. -- Limiting her activity level  Recent Hospitalizations: None  Studies Personally Reviewed - (if available, images/films reviewed: From Epic Chart or Care Everywhere)  None  Interval History:  Samantha Mcbride presents today actually doing well from a cardiac standpoint.  She is now doing Silver sneakers and chair yoga.  She retired in the beginning of the year is now only working 2 days a week part-time.  This gives her freedom to be more active.  She still has lots of problems from her rheumatoid arthritis with mostly hip pain down to the knees.  She is now off prednisone but has been off and on prednisone for most a year.  Thankfully though she is actually able to stay stable with her exercise.  The exercise seems to help loosen her up some.  She has not had any further episodes of chest tightness pressure with rest or exertion.  No real exertional dyspnea unless she really is pushing it during exercise.  No PND, orthopnea with trace minimal at end of day edema.  No rapid irregular heartbeats palpitations.  No syncope/near syncope or TIA/amaurosis fugax.  No claudication.  ROS: A comprehensive was performed. Review of Systems  Constitutional: Negative for malaise/fatigue.  HENT: Negative for congestion and nosebleeds.   Respiratory:  Positive for shortness of breath (if she over-exerts).   Cardiovascular: Negative.   Gastrointestinal: Negative for blood in stool and melena.  Genitourinary: Negative for hematuria.  Musculoskeletal: Positive for back pain (with L-side sciatica), joint pain (L hip & knee) and myalgias.       Intermittently active rheumatoid arthritis pains.  Mostly hips and knees.  Neurological: Negative for dizziness and focal weakness.  Psychiatric/Behavioral: Negative for depression and memory loss. The patient is not nervous/anxious and does not have insomnia.   All other systems reviewed and are negative.   I have reviewed and (if needed) personally updated the patient's problem list, medications, allergies, past medical and surgical history, social and family history.   Past Medical History:  Diagnosis Date  . CAD S/P percutaneous coronary angioplasty November 2012   Promus DES - mid LAD 3.5 mm x 24 mm (3.72 mm)   . Dyslipidemia, goal LDL below 70     on statin, close to goal  . Former moderate cigarette smoker (10-19 per day)   . Glucose intolerance (impaired glucose tolerance)   . H/O thyroid nodule    Benign  . History of ST elevation myocardial infarction (STEMI) of anterior wall November 2012   With cardiac arrest, 100% mobility occlusion. --> Promus DES   . Hypertension     Past Surgical History:  Procedure Laterality Date  . ABDOMINAL AORTAGRAM N/A 08/27/2011   Procedure: ABDOMINAL Maxcine Ham;  Surgeon: Troy Sine, MD;  Location: [facility] CATH LAB;  Service: Cardiovascular;  Laterality: N/A;  . BREAST EXCISIONAL BIOPSY Left   . CARDIAC CATHETERIZATION  November 2012   For anterior STEMI/cardiac arrest -- 100% mid LAD occlusion  . CORONARY ANGIOPLASTY WITH STENT PLACEMENT  November 2012    mid LAD PCI --> Promus Element DES 3.5 mm at 24 mm (3.72 mm)  . LEFT HEART CATHETERIZATION WITH CORONARY ANGIOGRAM N/A 08/27/2011   Procedure: LEFT HEART CATHETERIZATION WITH CORONARY ANGIOGRAM;   Surgeon: Troy Sine, MD;  Location: [facility] CATH LAB;  Service: Cardiovascular;  Laterality: N/A;  . PERCUTANEOUS CORONARY STENT INTERVENTION (PCI-S) N/A 08/27/2011   Procedure: PERCUTANEOUS CORONARY STENT INTERVENTION (PCI-S);  Surgeon: Troy Sine, MD;  Location: [facility] C[facility] LAB;  Service: Cardiovascular;  Laterality: N/A;  . TRANSTHORACIC ECHOCARDIOGRAM  November 2012   EF 40-45%, moderate at K. of mid and distal inferior septum and anterior apical myocardium. Grade 1 diastolic function. -- Followup echocardiogram to reassess his EF was denied by insurance company    Current Meds  Medication Sig  . Adalimumab (HUMIRA PEN) 40 MG/0.4ML PNKT Inject 40 mg into the skin every 21 ( twenty-one) days.  . carvedilol (COREG) 6.25 MG tablet TAKE 1 TABLET BY MOUTH TWICE A DAY WITH A MEAL  . clopidogrel (PLAVIX) 75 MG tablet TAKE 1 TABLET BY MOUTH DAILY  . diclofenac (VOLTAREN) 0.1 % ophthalmic solution every 6 (six) hours.  . folic acid (FOLVITE) 1 MG tablet Take 1 mg daily by mouth.  Marland Kitchen lisinopril (PRINIVIL,ZESTRIL) 2.5 MG tablet TAKE 1 TABLET BY MOUTH EVERY DAY  . methotrexate (RHEUMATREX) 2.5 MG tablet TAKE 4 TABLETS BY MOUTH ONCE A WEEK  . nitroGLYCERIN (NITROSTAT) 0.4 MG SL tablet PLACE 1 TABLET UNDER THE TONGUE EVERY 5 MINUTES AS NEEDED FOR CHEST PAIN.  . traZODone (DESYREL) 100 MG tablet Take by mouth at bedtime. Dose unknown     No Known Allergies  Social History   Tobacco Use  . Smoking status: Former Smoker    Packs/day: 1.00    Years: 40.00    Pack years: 40.00    Types: Cigarettes    Last attempt to quit: 08/27/2011    Years since quitting: 7.0  . Smokeless tobacco: Never Used  Substance Use Topics  . Alcohol use: No  . Drug use: No   Social History   Social History Narrative   Single mother of one, grandmother of 2. She works for [employer] for [employer].   She quit smoking time of her MI in November 2012. Does not take alcohol.   She had been doing  really well he exercises at least 3-4 days a week, but really get discouraged that she is not been able to drop weight.   Family  History family history includes Breast cancer in her mother and sister; Cancer in her mother; Heart failure in her father; Hypertension in her mother.  Wt Readings from Last 3 Encounters:  08/29/18 186 lb 12.8 oz (84.7 kg)  08/26/17 192 lb 6.4 oz (87.3 kg)  08/24/16 195 lb 3.2 oz (88.5 kg)     PHYSICAL EXAM BP 118/72   Pulse 84   Ht 5\' 1"  (1.549 m)   Wt 186 lb 12.8 oz (84.7 kg)   BMI 35.30 kg/m  Physical Exam  Constitutional: She is oriented to person, place, and time. She appears well-developed and well-nourished. No distress.  Mildly obese  HENT:  Head: Normocephalic and atraumatic.  Neck: Normal range of motion. Neck supple.  No hepatojugular reflux (cannot assess) and no JVD (difficult to assess) present. Carotid bruit is not present.  Cardiovascular: Normal rate, regular rhythm, normal heart sounds and normal pulses.  No extrasystoles are present. Exam reveals no gallop and no friction rub.  No murmur heard. Pulmonary/Chest: Effort normal and breath sounds normal. No respiratory distress. She has no wheezes. She has no rales.  Abdominal: Soft. Bowel sounds are normal. She exhibits no distension. There is no tenderness. There is no rebound.  No HSM  Musculoskeletal: Normal range of motion. She exhibits no edema.  Neurological: She is alert and oriented to person, place, and time.  Psychiatric: She has a normal mood and affect. Her behavior is normal. Judgment and thought content normal.  Nursing note and vitals reviewed.   Adult ECG Report  Rate: 84;  Rhythm: normal sinus rhythm and Low voltage.  Cannot exclude septal and inferior MI, age undetermined.  Subtle ST-T wave abnormalities.  Consider anterior ischemia with T wave inversions.;  ST and T wave changes with T wave inversions are old  Narrative Interpretation: Stable EKG  As other studies  Reviewed: Additional studies/ records that were reviewed today include:  Recent Labs: August 19, 2018  Na+ 141, K+ 4.6, Cl- 103, HCO3-30, BUN 22, Cr 1.9, Glu 105, Ca2+ 9.9; AST 117, ALT 191, AlkP 79 =-> recheck LFTs 11/719: AST 72, ALT 161.  TC 173, TG 214, HDL 43, LDL 87; TSH 1.48.  Creatinine 1.19.  Hemoglobin 11.5.   ASSESSMENT / PLAN: Problem List Items Addressed This Visit    CAD S/P percutaneous coronary angioplasty - Primary (Chronic)    Decent sized Promus DES stent placed in the LAD and time of an MI.  She is now on maintenance dose Plavix without aspirin. On carvedilol and lisinopril at stable dose. Currently atorvastatin on hold for elevated LFTs. ->  Will refer to CV RR to assist      Relevant Orders   EKG 12-Lead   Dyslipidemia, goal LDL below 70 (Chronic)    Has been on Atorvastatin for years - LDL still ~87 this year & statin currently on hold due to elevated LFTs --> will wait until RUQ US done to ensure no hepatic condition to restart --> will take advantage of this break to have her f/u with our clinical pharmacists in [facility] ([facility])      Essential hypertension (Chronic)    Blood pressure looks well controlled on current dose carvedilol and lisinopril.  No change.      Relevant Orders   EKG 12-Lead   H/O Anterior STEMI:  Proximal LAD  insertion of a 3.5x24 mm Promus element DES (08/2011) (Chronic)    Significant anterior STEMI comp gated by VF arrest while in the emergency room.  EF mildly reduced at 40 to 45% (with follow-up echocardiogram denied). No recurrent angina or dyspnea symptoms. On stable medications.      Obesity (BMI 30-39.9) (Chronic)    With BMI of 35 and her cardiac risk factors she is borderline morbid obesity.  She is now much more active with her exercise.  We are hoping that with some dietary modification and exercise she will be lose weight.  She has been doing well with losing weight with exception  of when she has cut back on prednisone.  She is down 9 pounds from 2017.         Current medicines are reviewed at length with the patient today. (+/- concerns) n/a The  following changes have been made: change to Plavix.   Patient Instructions  Medication Instructions:  Not needed  If you need a refill on your cardiac medications before your next appointment, please call your pharmacy.   Lab work: Not needed  If you have labs (blood work) drawn today and your tests are completely normal, you will receive your results only by: Marland Kitchen MyChart Message (if you have MyChart) OR . A paper copy in the mail If you have any lab test that is abnormal or we need to change your treatment, we will call you to review the results.  Testing/Procedures: Not needed  Follow-Up: At [facility], you and your health needs are our priority.  As part of our continuing mission to provide you with exceptional heart care, we have created designated Provider Care Teams.  These Care Teams include your primary Cardiologist (physician) and Advanced Practice Providers (APPs -  Physician Assistants and Nurse Practitioners) who all work together to provide you with the care you need, when you need it.  Your physician recommends that you schedule a follow-up appointment in 2 -3 weeks CVRR - LIPIDS DEC 2019  You will need a follow up appointment in 12 months.  Please call our office 2 months in advance to schedule this appointment.  You may see Glenetta Hew, MD or one of the following Advanced Practice Providers on your designated Care Team:   Rosaria Ferries, PA-C . Jory Sims, DNP, ANP  Any Other Special Instructions Will Be Listed Below (If Applicable).     Studies Ordered:   Orders Placed This Encounter  Procedures  . EKG 12-Lead      Glenetta Hew, M.D., M.S. Interventional Cardiologist   Pager # [phone] Phone # [phone] [address] [place],   [address]   Recent Labs: May 14, 2027  Na+ 142, K+ 4.3, Cl- 106, HCO3-0.7, BUN 21, Cr 1.19, Glu 108, Ca2+ 9.4; AST 16, ALT, 17, AlkP 84,  T Bili 0.7, TP 7.1,Alb 2  TC 160, TG 208 (H), HDL 39, LDL 80 (goal less than 70)  --Recommend starting Zetia 10 mg daily  Glenetta Hew, MD

## 2018-08-29 NOTE — Assessment & Plan Note (Signed)
Significant anterior STEMI comp gated by VF arrest while in the emergency room.  EF mildly reduced at 40 to 45% (with follow-up echocardiogram denied). No recurrent angina or dyspnea symptoms. On stable medications.

## 2018-08-29 NOTE — Patient Instructions (Signed)
Medication Instructions:  Not needed  If you need a refill on your cardiac medications before your next appointment, please call your pharmacy.   Lab work: Not needed  If you have labs (blood work) drawn today and your tests are completely normal, you will receive your results only by: Marland Kitchen MyChart Message (if you have MyChart) OR . A paper copy in the mail If you have any lab test that is abnormal or we need to change your treatment, we will call you to review the results.  Testing/Procedures: Not needed  Follow-Up: At [facility], you and your health needs are our priority.  As part of our continuing mission to provide you with exceptional heart care, we have created designated Provider Care Teams.  These Care Teams include your primary Cardiologist (physician) and Advanced Practice Providers (APPs -  Physician Assistants and Nurse Practitioners) who all work together to provide you with the care you need, when you need it.  Your physician recommends that you schedule a follow-up appointment in 2 -3 weeks CVRR - LIPIDS DEC 2019  You will need a follow up appointment in 12 months.  Please call our office 2 months in advance to schedule this appointment.  You may see Glenetta Hew, MD or one of the following Advanced Practice Providers on your designated Care Team:   Rosaria Ferries, PA-C . Jory Sims, DNP, ANP  Any Other Special Instructions Will Be Listed Below (If Applicable).

## 2018-08-29 NOTE — Assessment & Plan Note (Signed)
Decent sized Promus DES stent placed in the LAD and time of an MI.  She is now on maintenance dose Plavix without aspirin. On carvedilol and lisinopril at stable dose. Currently atorvastatin on hold for elevated LFTs. ->  Will refer to CV RR to assist

## 2018-08-29 NOTE — Assessment & Plan Note (Signed)
Blood pressure looks well controlled on current dose carvedilol and lisinopril.  No change.

## 2018-08-29 NOTE — Assessment & Plan Note (Signed)
With BMI of 35 and her cardiac risk factors she is borderline morbid obesity.  She is now much more active with her exercise.  We are hoping that with some dietary modification and exercise she will be lose weight.  She has been doing well with losing weight with exception of when she has cut back on prednisone.  She is down 9 pounds from 2017.

## 2018-09-02 ENCOUNTER — Ambulatory Visit
Admission: RE | Admit: 2018-09-02 | Discharge: 2018-09-02 | Disposition: A | Discharge status: Home / Self Care | Payer: Medicare Other | Source: Ambulatory Visit | Attending: Family Medicine | Admitting: Family Medicine

## 2018-09-02 DIAGNOSIS — R945 Abnormal results of liver function studies: Principal | ICD-10-CM

## 2018-09-02 DIAGNOSIS — R7989 Other specified abnormal findings of blood chemistry: Secondary | ICD-10-CM

## 2018-09-18 ENCOUNTER — Encounter: Payer: Self-pay | Admitting: Pharmacist

## 2018-09-18 ENCOUNTER — Ambulatory Visit: Payer: Medicare Other | Admitting: Pharmacist

## 2018-09-18 DIAGNOSIS — E785 Hyperlipidemia, unspecified: Secondary | ICD-10-CM | POA: Diagnosis not present

## 2018-09-18 MED ORDER — ATORVASTATIN CALCIUM 20 MG PO TABS: 20.0000 mg | ORAL_TABLET | Freq: Every day | ORAL | 1 refills | Status: DC

## 2018-09-18 NOTE — Patient Instructions (Addendum)
[facility] (pharmacist) [facility]  *START paperwork for Repatha 140mg  every 14 days* *RESUME atorvastatin at 20mg  daily*  Cholesterol Cholesterol is a fat. Your body needs a small amount of cholesterol. Cholesterol (plaque) may build up in your blood vessels (arteries). That makes you more likely to have a heart attack or stroke. You cannot feel your cholesterol level. Having a blood test is the only way to find out if your level is high. Keep your test results. Work with your doctor to keep your cholesterol at a good level. What do the results mean?  Total cholesterol is how much cholesterol is in your blood.  LDL is bad cholesterol. This is the type that can build up. Try to have low LDL.  HDL is good cholesterol. It cleans your blood vessels and carries LDL away. Try to have high HDL.  Triglycerides are fat that the body can store or burn for energy. What are good levels of cholesterol?  Total cholesterol below 200.  LDL below 100 is good for people who have health risks. LDL below 70 is good for people who have very high risks.  HDL above 40 is good. It is best to have HDL of 60 or higher.  Triglycerides below 150. How can I lower my cholesterol? Diet Follow your diet program as told by your doctor.  Choose fish, white meat chicken, or [place] that is roasted or baked. Try not to eat red meat, fried foods, sausage, or lunch meats.  Eat lots of fresh fruits and vegetables.  Choose whole grains, beans, pasta, potatoes, and cereals.  Choose olive oil, corn oil, or canola oil. Only use small amounts.  Try not to eat butter, mayonnaise, shortening, or palm kernel oils.  Try not to eat foods with trans fats.  Choose low-fat or nonfat dairy foods. ? Drink skim or nonfat milk. ? Eat low-fat or nonfat yogurt and cheeses. ? Try not to drink whole milk or cream. ? Try not to eat ice cream, egg yolks, or full-fat cheeses.  Healthy desserts include angel food cake,  ginger snaps, animal crackers, hard candy, popsicles, and low-fat or nonfat frozen yogurt. Try not to eat pastries, cakes, pies, and cookies.  Exercise Follow your exercise program as told by your doctor.  Be more active. Try gardening, walking, and taking the stairs.  Ask your doctor about ways that you can be more active.  Medicine  Take over-the-counter and prescription medicines only as told by your doctor. This information is not intended to replace advice given to you by your health care provider. Make sure you discuss any questions you have with your health care provider. Document Released: 12/28/2008 Document Revised: 05/02/2016 Document Reviewed: 04/12/2016 Elsevier Interactive Patient Education  [employer].

## 2018-09-18 NOTE — Assessment & Plan Note (Signed)
LDL remains above goal for secondary prevention. Patient was on atorvastatin 40mg  daily with LDL above goal. We are unable to increase her statin dose due to history of elevated LFTs . Trial with  [id number] appropriate at this time.   Repatha/Praleunt indication, MOA, administration, storage, common side effects, prior-authorization process, monitoring, and patient assistance programs were discussed during this office visit.   Will resume atorvastatin at 20mg  daily, repeat LFTs in 1 week, start paperwork for Repatha PA, and continue to monitor as needed. Plan to repeat fasting lipid panel and LFTs 2 months after initiating Repatha.

## 2018-09-18 NOTE — Progress Notes (Signed)
Patient ID: Samantha Mcbride                 DOB: 09-16-1951                    MRN: [id number]     HPI: MEYLIN STENZEL is a 67 y.o. female patient referred to lipid clinic by Dr Ellyn Hack. PMH is significant for CAD s/p angioplasty, dyslipidemia, hx of STEMI, and hypertension. Noted recent history of elevated LFTs with atorvastatin currently on HOLD until further assessment. Patient presents today for potential PCSK9i initiation and medication titration. She reports recent visit with GI doctor where metotraxate was suspected as the cause for elevated LFTs. Patient denies increased muscle ache, nausea, or vomiting.  Current Medications: none  Intolerances: atorvastatin - hold for elevated LFTs  LDL goal: <70 mg/dL  Diet: works on [employer], recognized doing well with decreased fatty foods , but continues to strugle with eating too many sweets.   Exercise: silver snickers and yoga at the [facility]  Family History: breast cancer in mother, MI in father and grandfather  Social History: former smoker, denies alcohol or other drugs  Labs: 08/19/2018: CHO 173; HDL 43; TG 214; LDL 87 (on atorvastatin 40mg  daily)   Past Medical History:  Diagnosis Date  . CAD S/P percutaneous coronary angioplasty November 2012   Promus DES - mid LAD 3.5 mm x 24 mm (3.72 mm)   . Dyslipidemia, goal LDL below 70     on statin, close to goal  . Former moderate cigarette smoker (10-19 per day)   . Glucose intolerance (impaired glucose tolerance)   . H/O thyroid nodule    Benign  . History of ST elevation myocardial infarction (STEMI) of anterior wall November 2012   With cardiac arrest, 100% mobility occlusion. --> Promus DES   . Hypertension     Current Outpatient Medications on File Prior to Visit  Medication Sig Dispense Refill  . Adalimumab (HUMIRA PEN) 40 MG/0.4ML PNKT Inject 40 mg into the skin every 21 ( twenty-one) days.    . carvedilol (COREG) 6.25 MG tablet TAKE 1 TABLET BY MOUTH TWICE A DAY WITH A  MEAL 60 tablet 5  . clopidogrel (PLAVIX) 75 MG tablet TAKE 1 TABLET BY MOUTH DAILY 90 tablet 3  . diclofenac (VOLTAREN) 0.1 % ophthalmic solution every 6 (six) hours.    . folic acid (FOLVITE) 1 MG tablet Take 1 mg daily by mouth.  2  . lisinopril (PRINIVIL,ZESTRIL) 2.5 MG tablet TAKE 1 TABLET BY MOUTH EVERY DAY 90 tablet 3  . methotrexate (RHEUMATREX) 2.5 MG tablet Take 17.5 mg by mouth once a week. Caution:Chemotherapy. Protect from light.    . nitroGLYCERIN (NITROSTAT) 0.4 MG SL tablet PLACE 1 TABLET UNDER THE TONGUE EVERY 5 MINUTES AS NEEDED FOR CHEST PAIN. 25 tablet 4  . tiZANidine (ZANAFLEX) 2 MG tablet Take 2 mg by mouth as needed for muscle spasms.    . traZODone (DESYREL) 100 MG tablet Take by mouth at bedtime. Dose unknown      No current facility-administered medications on file prior to visit.     No Known Allergies  Dyslipidemia, goal LDL below 70 LDL remains above goal for secondary prevention. Patient was on atorvastatin 40mg  daily with LDL above goal. We are unable to increase her statin dose due to history of elevated LFTs . Trial with  PCSK9iis appropriate at this time.   Repatha/Praleunt indication, MOA, administration, storage, common side effects, prior-authorization process, monitoring,  and patient assistance programs were discussed during this office visit.   Will resume atorvastatin at 20mg  daily, repeat LFTs in 1 week, start paperwork for Repatha PA, and continue to monitor as needed. Plan to repeat fasting lipid panel and LFTs 2 months after initiating Repatha.     Daimien Patmon Rodriguez-Guzman PharmD, BCPS, [place] [id number] 09/18/2018 9:29 PM

## 2018-09-26 ENCOUNTER — Other Ambulatory Visit: Payer: Self-pay | Admitting: Cardiology

## 2018-09-26 NOTE — Telephone Encounter (Signed)
Rx request sent to pharmacy.  

## 2018-09-29 ENCOUNTER — Telehealth: Payer: Self-pay | Admitting: Pharmacist

## 2018-09-29 NOTE — Telephone Encounter (Signed)
Patient back on Lipitor 20mg  daily. LFTs remains elevated and followed by rheumatologist.   Patient OFF methotrexate   Will repeat LFTs on 09/19/2018 and consider Repatha is needed.

## 2018-10-12 ENCOUNTER — Other Ambulatory Visit: Payer: Self-pay | Admitting: Cardiology

## 2018-10-28 ENCOUNTER — Other Ambulatory Visit: Payer: Self-pay | Admitting: Cardiology

## 2019-02-20 ENCOUNTER — Other Ambulatory Visit: Payer: Self-pay | Admitting: Obstetrics and Gynecology

## 2019-02-20 ENCOUNTER — Other Ambulatory Visit: Payer: Self-pay | Admitting: Family Medicine

## 2019-02-20 DIAGNOSIS — Z1231 Encounter for screening mammogram for malignant neoplasm of breast: Secondary | ICD-10-CM

## 2019-04-08 ENCOUNTER — Other Ambulatory Visit: Payer: Self-pay | Admitting: Nephrology

## 2019-04-08 DIAGNOSIS — N179 Acute kidney failure, unspecified: Secondary | ICD-10-CM

## 2019-04-09 ENCOUNTER — Ambulatory Visit
Admission: RE | Admit: 2019-04-09 | Discharge: 2019-04-09 | Disposition: A | Discharge status: Home / Self Care | Payer: Medicare Other | Source: Ambulatory Visit | Attending: Nephrology | Admitting: Nephrology

## 2019-04-09 DIAGNOSIS — N179 Acute kidney failure, unspecified: Secondary | ICD-10-CM

## 2019-04-10 ENCOUNTER — Other Ambulatory Visit: Payer: Self-pay | Admitting: Cardiology

## 2019-04-14 ENCOUNTER — Ambulatory Visit: Payer: Medicare Other

## 2019-05-01 ENCOUNTER — Ambulatory Visit
Admission: RE | Admit: 2019-05-01 | Discharge: 2019-05-01 | Disposition: A | Discharge status: Home / Self Care | Payer: Medicare Other | Source: Ambulatory Visit | Attending: Family Medicine | Admitting: Family Medicine

## 2019-05-01 ENCOUNTER — Other Ambulatory Visit: Payer: Self-pay

## 2019-05-01 DIAGNOSIS — Z1231 Encounter for screening mammogram for malignant neoplasm of breast: Secondary | ICD-10-CM

## 2019-05-11 ENCOUNTER — Other Ambulatory Visit: Payer: Self-pay

## 2019-05-11 ENCOUNTER — Inpatient Hospital Stay (HOSPITAL_COMMUNITY): Payer: Medicare Other | Admitting: Anesthesiology

## 2019-05-11 ENCOUNTER — Encounter (HOSPITAL_COMMUNITY): Payer: Self-pay | Admitting: *Deleted

## 2019-05-11 ENCOUNTER — Encounter (HOSPITAL_COMMUNITY)
Admission: AD | Disposition: A | Discharge status: Home / Self Care | Payer: Self-pay | Source: Other Acute Inpatient Hospital | Attending: Urology

## 2019-05-11 ENCOUNTER — Other Ambulatory Visit: Payer: Self-pay | Admitting: Urology

## 2019-05-11 ENCOUNTER — Emergency Department (HOSPITAL_COMMUNITY)
Admission: EM | Admit: 2019-05-11 | Discharge: 2019-05-11 | Disposition: A | Discharge status: Home / Self Care | Payer: Medicare Other | Source: Home / Self Care | Attending: Emergency Medicine | Admitting: Emergency Medicine

## 2019-05-11 ENCOUNTER — Inpatient Hospital Stay (HOSPITAL_COMMUNITY)
Admission: AD | Admit: 2019-05-11 | Discharge: 2019-05-14 | DRG: 660 | Disposition: A | Discharge status: Home / Self Care | Payer: Medicare Other | Source: Other Acute Inpatient Hospital | Attending: Urology | Admitting: Urology

## 2019-05-11 ENCOUNTER — Emergency Department (HOSPITAL_COMMUNITY): Payer: Medicare Other

## 2019-05-11 ENCOUNTER — Inpatient Hospital Stay (HOSPITAL_COMMUNITY): Payer: Medicare Other

## 2019-05-11 DIAGNOSIS — R112 Nausea with vomiting, unspecified: Secondary | ICD-10-CM

## 2019-05-11 DIAGNOSIS — I129 Hypertensive chronic kidney disease with stage 1 through stage 4 chronic kidney disease, or unspecified chronic kidney disease: Secondary | ICD-10-CM | POA: Diagnosis present

## 2019-05-11 DIAGNOSIS — I471 Supraventricular tachycardia: Secondary | ICD-10-CM | POA: Diagnosis not present

## 2019-05-11 DIAGNOSIS — Z87891 Personal history of nicotine dependence: Secondary | ICD-10-CM

## 2019-05-11 DIAGNOSIS — M1990 Unspecified osteoarthritis, unspecified site: Secondary | ICD-10-CM | POA: Diagnosis present

## 2019-05-11 DIAGNOSIS — Z8249 Family history of ischemic heart disease and other diseases of the circulatory system: Secondary | ICD-10-CM

## 2019-05-11 DIAGNOSIS — K449 Diaphragmatic hernia without obstruction or gangrene: Secondary | ICD-10-CM | POA: Insufficient documentation

## 2019-05-11 DIAGNOSIS — N136 Pyonephrosis: Secondary | ICD-10-CM | POA: Diagnosis not present

## 2019-05-11 DIAGNOSIS — K5903 Drug induced constipation: Secondary | ICD-10-CM | POA: Diagnosis present

## 2019-05-11 DIAGNOSIS — T454X5A Adverse effect of iron and its compounds, initial encounter: Secondary | ICD-10-CM | POA: Insufficient documentation

## 2019-05-11 DIAGNOSIS — Z23 Encounter for immunization: Secondary | ICD-10-CM

## 2019-05-11 DIAGNOSIS — B9620 Unspecified Escherichia coli [E. coli] as the cause of diseases classified elsewhere: Secondary | ICD-10-CM | POA: Diagnosis present

## 2019-05-11 DIAGNOSIS — Z20828 Contact with and (suspected) exposure to other viral communicable diseases: Secondary | ICD-10-CM | POA: Diagnosis present

## 2019-05-11 DIAGNOSIS — N201 Calculus of ureter: Secondary | ICD-10-CM | POA: Insufficient documentation

## 2019-05-11 DIAGNOSIS — N179 Acute kidney failure, unspecified: Secondary | ICD-10-CM | POA: Insufficient documentation

## 2019-05-11 DIAGNOSIS — E669 Obesity, unspecified: Secondary | ICD-10-CM | POA: Diagnosis present

## 2019-05-11 DIAGNOSIS — Z8674 Personal history of sudden cardiac arrest: Secondary | ICD-10-CM

## 2019-05-11 DIAGNOSIS — I252 Old myocardial infarction: Secondary | ICD-10-CM

## 2019-05-11 DIAGNOSIS — I2510 Atherosclerotic heart disease of native coronary artery without angina pectoris: Secondary | ICD-10-CM | POA: Insufficient documentation

## 2019-05-11 DIAGNOSIS — Z79899 Other long term (current) drug therapy: Secondary | ICD-10-CM | POA: Insufficient documentation

## 2019-05-11 DIAGNOSIS — Z7902 Long term (current) use of antithrombotics/antiplatelets: Secondary | ICD-10-CM | POA: Insufficient documentation

## 2019-05-11 DIAGNOSIS — I9589 Other hypotension: Secondary | ICD-10-CM | POA: Diagnosis not present

## 2019-05-11 DIAGNOSIS — N183 Chronic kidney disease, stage 3 (moderate): Secondary | ICD-10-CM | POA: Diagnosis present

## 2019-05-11 DIAGNOSIS — Z6832 Body mass index (BMI) 32.0-32.9, adult: Secondary | ICD-10-CM

## 2019-05-11 DIAGNOSIS — Z955 Presence of coronary angioplasty implant and graft: Secondary | ICD-10-CM

## 2019-05-11 DIAGNOSIS — E7800 Pure hypercholesterolemia, unspecified: Secondary | ICD-10-CM | POA: Diagnosis present

## 2019-05-11 DIAGNOSIS — Z803 Family history of malignant neoplasm of breast: Secondary | ICD-10-CM

## 2019-05-11 DIAGNOSIS — I9788 Other intraoperative complications of the circulatory system, not elsewhere classified: Secondary | ICD-10-CM | POA: Diagnosis not present

## 2019-05-11 DIAGNOSIS — I10 Essential (primary) hypertension: Secondary | ICD-10-CM | POA: Insufficient documentation

## 2019-05-11 DIAGNOSIS — M069 Rheumatoid arthritis, unspecified: Secondary | ICD-10-CM | POA: Diagnosis present

## 2019-05-11 DIAGNOSIS — E785 Hyperlipidemia, unspecified: Secondary | ICD-10-CM | POA: Diagnosis present

## 2019-05-11 HISTORY — DX: Other specified postprocedural states: Z98.890

## 2019-05-11 HISTORY — DX: Other complications of anesthesia, initial encounter: T88.59XA

## 2019-05-11 HISTORY — DX: Nausea with vomiting, unspecified: R11.2

## 2019-05-11 HISTORY — PX: CYSTOSCOPY WITH RETROGRADE PYELOGRAM, URETEROSCOPY AND STENT PLACEMENT: SHX5789

## 2019-05-11 LAB — URINALYSIS, ROUTINE W REFLEX MICROSCOPIC
Bilirubin Urine: NEGATIVE
Glucose, UA: 50 mg/dL — AB
Ketones, ur: NEGATIVE mg/dL
Nitrite: NEGATIVE
Protein, ur: 30 mg/dL — AB
Specific Gravity, Urine: 1.009 (ref 1.005–1.030)
WBC, UA: >50 WBC/hpf — ABNORMAL HIGH (ref 0–5)
pH: 6 (ref 5.0–8.0)

## 2019-05-11 LAB — COMPREHENSIVE METABOLIC PANEL
ALT: 15 U/L (ref 0–44)
AST: 16 U/L (ref 15–41)
Albumin: 3.4 g/dL — ABNORMAL LOW (ref 3.5–5.0)
Alkaline Phosphatase: 63 U/L (ref 38–126)
Anion gap: 10 (ref 5–15)
BUN: 19 mg/dL (ref 8–23)
CO2: 27 mmol/L (ref 22–32)
Calcium: 9 mg/dL (ref 8.9–10.3)
Chloride: 100 mmol/L (ref 98–111)
Creatinine, Ser: 2.75 mg/dL — ABNORMAL HIGH (ref 0.44–1.00)
GFR calc Af Amer: 20 mL/min — ABNORMAL LOW (ref >60)
GFR calc non Af Amer: 17 mL/min — ABNORMAL LOW (ref >60)
Glucose, Bld: 174 mg/dL — ABNORMAL HIGH (ref 70–99)
Potassium: 3.5 mmol/L (ref 3.5–5.1)
Sodium: 137 mmol/L (ref 135–145)
Total Bilirubin: 1 mg/dL (ref 0.3–1.2)
Total Protein: 6.9 g/dL (ref 6.5–8.1)

## 2019-05-11 LAB — CBC WITH DIFFERENTIAL/PLATELET
Abs Immature Granulocytes: 0.17 10*3/uL — ABNORMAL HIGH (ref 0.00–0.07)
Basophils Absolute: 0 10*3/uL (ref 0.0–0.1)
Basophils Relative: 0 %
Eosinophils Absolute: 0 10*3/uL (ref 0.0–0.5)
Eosinophils Relative: 0 %
HCT: 30.9 % — ABNORMAL LOW (ref 36.0–46.0)
Hemoglobin: 10 g/dL — ABNORMAL LOW (ref 12.0–15.0)
Immature Granulocytes: 1 %
Lymphocytes Relative: 3 %
Lymphs Abs: 0.5 10*3/uL — ABNORMAL LOW (ref 0.7–4.0)
MCH: 31.3 pg (ref 26.0–34.0)
MCHC: 32.4 g/dL (ref 30.0–36.0)
MCV: 96.9 fL (ref 80.0–100.0)
Monocytes Absolute: 0.2 10*3/uL (ref 0.1–1.0)
Monocytes Relative: 2 %
Neutro Abs: 15.1 10*3/uL — ABNORMAL HIGH (ref 1.7–7.7)
Neutrophils Relative %: 94 %
Platelets: 253 10*3/uL (ref 150–400)
RBC: 3.19 MIL/uL — ABNORMAL LOW (ref 3.87–5.11)
RDW: 12.3 % (ref 11.5–15.5)
WBC: 16.1 10*3/uL — ABNORMAL HIGH (ref 4.0–10.5)
nRBC: 0 % (ref 0.0–0.2)

## 2019-05-11 LAB — LIPASE, BLOOD: Lipase: 32 U/L (ref 11–51)

## 2019-05-11 LAB — SARS CORONAVIRUS 2 BY RT PCR (HOSPITAL ORDER, PERFORMED IN ~~LOC~~ HOSPITAL LAB): SARS Coronavirus 2: NEGATIVE

## 2019-05-11 SURGERY — CYSTOURETEROSCOPY, WITH RETROGRADE PYELOGRAM AND STENT INSERTION
Anesthesia: General | Laterality: Bilateral

## 2019-05-11 MED ORDER — PHENYLEPHRINE 40 MCG/ML (10ML) SYRINGE FOR IV PUSH (FOR BLOOD PRESSURE SUPPORT)
PREFILLED_SYRINGE | INTRAVENOUS | Status: AC
  Filled 2019-05-11: qty 10

## 2019-05-11 MED ORDER — BELLADONNA ALKALOIDS-OPIUM 16.2-60 MG RE SUPP: 1.0000 | Freq: Four times a day (QID) | RECTAL | Status: DC | PRN

## 2019-05-11 MED ORDER — ONDANSETRON HCL 4 MG/2ML IJ SOLN
INTRAMUSCULAR | Status: AC
  Filled 2019-05-11: qty 2

## 2019-05-11 MED ORDER — HEPARIN SODIUM (PORCINE) 5000 UNIT/ML IJ SOLN
5000.0000 [IU] | Freq: Three times a day (TID) | INTRAMUSCULAR | Status: DC
  Administered 2019-05-11 – 2019-05-14 (×8): 5000 [IU] via SUBCUTANEOUS
  Filled 2019-05-11 (×8): qty 1

## 2019-05-11 MED ORDER — ONDANSETRON HCL 4 MG/2ML IJ SOLN
4.0000 mg | Freq: Once | INTRAMUSCULAR | Status: AC
  Administered 2019-05-11: 4 mg via INTRAVENOUS
  Filled 2019-05-11: qty 2

## 2019-05-11 MED ORDER — PROMETHAZINE HCL 25 MG RE SUPP: 25.0000 mg | Freq: Four times a day (QID) | RECTAL | 0 refills | Status: DC | PRN

## 2019-05-11 MED ORDER — CEFAZOLIN SODIUM-DEXTROSE 2-4 GM/100ML-% IV SOLN
INTRAVENOUS | Status: AC
  Filled 2019-05-11: qty 100

## 2019-05-11 MED ORDER — ACETAMINOPHEN 325 MG PO TABS: 650.0000 mg | ORAL_TABLET | ORAL | Status: DC | PRN

## 2019-05-11 MED ORDER — MIDAZOLAM HCL 2 MG/2ML IJ SOLN
INTRAMUSCULAR | Status: AC
  Filled 2019-05-11: qty 2

## 2019-05-11 MED ORDER — VITAMIN D 25 MCG (1000 UNIT) PO TABS
5000.0000 [IU] | ORAL_TABLET | Freq: Every day | ORAL | Status: DC
  Administered 2019-05-11 – 2019-05-14 (×4): 5000 [IU] via ORAL
  Filled 2019-05-11 (×4): qty 5

## 2019-05-11 MED ORDER — CARVEDILOL 12.5 MG PO TABS
12.5000 mg | ORAL_TABLET | Freq: Two times a day (BID) | ORAL | Status: DC
  Administered 2019-05-11 – 2019-05-14 (×6): 12.5 mg via ORAL
  Filled 2019-05-11 (×6): qty 1

## 2019-05-11 MED ORDER — ATORVASTATIN CALCIUM 40 MG PO TABS
40.0000 mg | ORAL_TABLET | Freq: Every day | ORAL | Status: DC
  Administered 2019-05-12 – 2019-05-13 (×2): 40 mg via ORAL
  Filled 2019-05-11 (×3): qty 1

## 2019-05-11 MED ORDER — ZOLPIDEM TARTRATE 5 MG PO TABS
5.0000 mg | ORAL_TABLET | Freq: Every evening | ORAL | Status: DC | PRN
  Administered 2019-05-12 – 2019-05-13 (×2): 5 mg via ORAL
  Filled 2019-05-11 (×3): qty 1

## 2019-05-11 MED ORDER — FERROUS SULFATE 325 (65 FE) MG PO TABS
325.0000 mg | ORAL_TABLET | Freq: Every day | ORAL | Status: DC
  Administered 2019-05-11 – 2019-05-14 (×4): 325 mg via ORAL
  Filled 2019-05-11 (×4): qty 1

## 2019-05-11 MED ORDER — PROPOFOL 10 MG/ML IV BOLUS
INTRAVENOUS | Status: DC | PRN
  Administered 2019-05-11: 200 mg via INTRAVENOUS

## 2019-05-11 MED ORDER — FENTANYL CITRATE (PF) 100 MCG/2ML IJ SOLN
INTRAMUSCULAR | Status: DC | PRN
  Administered 2019-05-11 (×2): 50 ug via INTRAVENOUS

## 2019-05-11 MED ORDER — DEXAMETHASONE SODIUM PHOSPHATE 10 MG/ML IJ SOLN
INTRAMUSCULAR | Status: DC | PRN
  Administered 2019-05-11: 10 mg via INTRAVENOUS

## 2019-05-11 MED ORDER — SENNA 8.6 MG PO TABS
1.0000 | ORAL_TABLET | Freq: Two times a day (BID) | ORAL | Status: DC
  Administered 2019-05-11 – 2019-05-14 (×6): 8.6 mg via ORAL
  Filled 2019-05-11 (×6): qty 1

## 2019-05-11 MED ORDER — DIPHENHYDRAMINE HCL 12.5 MG/5ML PO ELIX: 12.5000 mg | ORAL_SOLUTION | Freq: Four times a day (QID) | ORAL | Status: DC | PRN

## 2019-05-11 MED ORDER — LIDOCAINE 2% (20 MG/ML) 5 ML SYRINGE
INTRAMUSCULAR | Status: AC
  Filled 2019-05-11: qty 5

## 2019-05-11 MED ORDER — MORPHINE SULFATE (PF) 2 MG/ML IV SOLN: 2.0000 mg | INTRAVENOUS | Status: DC | PRN

## 2019-05-11 MED ORDER — SODIUM CHLORIDE 0.9 % IV BOLUS
1000.0000 mL | Freq: Once | INTRAVENOUS | Status: AC
  Administered 2019-05-11: 1000 mL via INTRAVENOUS

## 2019-05-11 MED ORDER — PHENYLEPHRINE 40 MCG/ML (10ML) SYRINGE FOR IV PUSH (FOR BLOOD PRESSURE SUPPORT)
PREFILLED_SYRINGE | INTRAVENOUS | Status: DC | PRN
  Administered 2019-05-11 (×5): 80 ug via INTRAVENOUS

## 2019-05-11 MED ORDER — PROPOFOL 10 MG/ML IV BOLUS
INTRAVENOUS | Status: AC
  Filled 2019-05-11: qty 20

## 2019-05-11 MED ORDER — ONDANSETRON HCL 4 MG/2ML IJ SOLN
INTRAMUSCULAR | Status: DC | PRN
  Administered 2019-05-11: 4 mg via INTRAVENOUS

## 2019-05-11 MED ORDER — DEXAMETHASONE SODIUM PHOSPHATE 10 MG/ML IJ SOLN
INTRAMUSCULAR | Status: AC
  Filled 2019-05-11: qty 1

## 2019-05-11 MED ORDER — OXYCODONE HCL 5 MG PO TABS: 5.0000 mg | ORAL_TABLET | ORAL | Status: DC | PRN

## 2019-05-11 MED ORDER — OXYBUTYNIN CHLORIDE 5 MG PO TABS: 5.0000 mg | ORAL_TABLET | Freq: Three times a day (TID) | ORAL | Status: DC | PRN

## 2019-05-11 MED ORDER — OXYCODONE HCL 5 MG/5ML PO SOLN: 5.0000 mg | Freq: Once | ORAL | Status: DC | PRN

## 2019-05-11 MED ORDER — MIDAZOLAM HCL 5 MG/5ML IJ SOLN
INTRAMUSCULAR | Status: DC | PRN
  Administered 2019-05-11: 2 mg via INTRAVENOUS

## 2019-05-11 MED ORDER — SODIUM CHLORIDE 0.9 % IR SOLN
Status: DC | PRN
  Administered 2019-05-11: 3000 mL via INTRAVESICAL

## 2019-05-11 MED ORDER — SODIUM CHLORIDE 0.9 % IV SOLN
INTRAVENOUS | Status: DC
  Administered 2019-05-11 – 2019-05-13 (×4): via INTRAVENOUS

## 2019-05-11 MED ORDER — DIPHENHYDRAMINE HCL 50 MG/ML IJ SOLN: 12.5000 mg | Freq: Four times a day (QID) | INTRAMUSCULAR | Status: DC | PRN

## 2019-05-11 MED ORDER — MEPERIDINE HCL 50 MG/ML IJ SOLN: 6.2500 mg | INTRAMUSCULAR | Status: DC | PRN

## 2019-05-11 MED ORDER — IOHEXOL 300 MG/ML  SOLN
INTRAMUSCULAR | Status: DC | PRN
  Administered 2019-05-11: 30 mL via URETHRAL

## 2019-05-11 MED ORDER — MORPHINE SULFATE (PF) 4 MG/ML IV SOLN
4.0000 mg | Freq: Once | INTRAVENOUS | Status: AC
  Administered 2019-05-11: 4 mg via INTRAVENOUS
  Filled 2019-05-11: qty 1

## 2019-05-11 MED ORDER — CLOPIDOGREL BISULFATE 75 MG PO TABS
75.0000 mg | ORAL_TABLET | Freq: Every day | ORAL | Status: DC
  Administered 2019-05-11 – 2019-05-14 (×4): 75 mg via ORAL
  Filled 2019-05-11 (×4): qty 1

## 2019-05-11 MED ORDER — SODIUM CHLORIDE 0.9 % IV SOLN
1.0000 g | INTRAVENOUS | Status: DC
  Administered 2019-05-11 – 2019-05-12 (×2): 1 g via INTRAVENOUS
  Filled 2019-05-11: qty 1
  Filled 2019-05-11: qty 10
  Filled 2019-05-11: qty 1

## 2019-05-11 MED ORDER — OXYCODONE HCL 5 MG PO TABS: 5.0000 mg | ORAL_TABLET | Freq: Once | ORAL | Status: DC | PRN

## 2019-05-11 MED ORDER — FENTANYL CITRATE (PF) 100 MCG/2ML IJ SOLN
INTRAMUSCULAR | Status: AC
  Filled 2019-05-11: qty 2

## 2019-05-11 MED ORDER — ALBUMIN HUMAN 5 % IV SOLN
INTRAVENOUS | Status: AC
  Filled 2019-05-11: qty 250

## 2019-05-11 MED ORDER — ONDANSETRON HCL 4 MG/2ML IJ SOLN: 4.0000 mg | INTRAMUSCULAR | Status: DC | PRN

## 2019-05-11 MED ORDER — FENTANYL CITRATE (PF) 100 MCG/2ML IJ SOLN: 25.0000 ug | INTRAMUSCULAR | Status: DC | PRN

## 2019-05-11 MED ORDER — METOCLOPRAMIDE HCL 5 MG/ML IJ SOLN: 10.0000 mg | Freq: Once | INTRAMUSCULAR | Status: DC | PRN

## 2019-05-11 MED ORDER — LIDOCAINE 2% (20 MG/ML) 5 ML SYRINGE
INTRAMUSCULAR | Status: DC | PRN
  Administered 2019-05-11: 75 mg via INTRAVENOUS

## 2019-05-11 MED ORDER — ALBUMIN HUMAN 5 % IV SOLN
12.5000 g | Freq: Once | INTRAVENOUS | Status: AC
  Administered 2019-05-11: 12.5 g via INTRAVENOUS

## 2019-05-11 MED ORDER — ACETAMINOPHEN 10 MG/ML IV SOLN
1000.0000 mg | Freq: Once | INTRAVENOUS | Status: AC
  Administered 2019-05-11: 1000 mg via INTRAVENOUS
  Filled 2019-05-11: qty 100

## 2019-05-11 MED ORDER — LACTATED RINGERS IV SOLN
INTRAVENOUS | Status: DC
  Administered 2019-05-11 (×2): via INTRAVENOUS

## 2019-05-11 MED ORDER — LISINOPRIL 5 MG PO TABS: 2.5000 mg | ORAL_TABLET | Freq: Every day | ORAL | Status: DC

## 2019-05-11 MED ORDER — TRAZODONE HCL 100 MG PO TABS
100.0000 mg | ORAL_TABLET | Freq: Every day | ORAL | Status: DC
  Administered 2019-05-11: 100 mg via ORAL
  Filled 2019-05-11: qty 1

## 2019-05-11 MED ORDER — SCOPOLAMINE 1 MG/3DAYS TD PT72
1.0000 | MEDICATED_PATCH | TRANSDERMAL | Status: DC
  Administered 2019-05-11: 1.5 mg via TRANSDERMAL
  Filled 2019-05-11: qty 1

## 2019-05-11 MED ORDER — DOCUSATE SODIUM 100 MG PO CAPS
100.0000 mg | ORAL_CAPSULE | Freq: Two times a day (BID) | ORAL | Status: DC
  Administered 2019-05-11 – 2019-05-14 (×6): 100 mg via ORAL
  Filled 2019-05-11 (×6): qty 1

## 2019-05-11 MED ORDER — CEFAZOLIN SODIUM-DEXTROSE 2-4 GM/100ML-% IV SOLN
2.0000 g | INTRAVENOUS | Status: AC
  Administered 2019-05-11: 2 g via INTRAVENOUS

## 2019-05-11 SURGICAL SUPPLY — 22 items
BAG URO CATCHER STRL LF (MISCELLANEOUS) ×2 IMPLANT
BASKET LASER NITINOL 1.9FR (BASKET) IMPLANT
BASKET ZERO TIP NITINOL 2.4FR (BASKET) IMPLANT
CATH INTERMIT  6FR 70CM (CATHETERS) IMPLANT
CATH URET 5FR 28IN CONE TIP (BALLOONS)
CATH URET 5FR 70CM CONE TIP (BALLOONS) IMPLANT
CLOTH BEACON ORANGE TIMEOUT ST (SAFETY) ×2 IMPLANT
COVER WAND RF STERILE (DRAPES) IMPLANT
EXTRACTOR STONE 1.7FRX115CM (UROLOGICAL SUPPLIES) IMPLANT
FIBER LASER FLEXIVA 365 (UROLOGICAL SUPPLIES) IMPLANT
FIBER LASER TRAC TIP (UROLOGICAL SUPPLIES) IMPLANT
GLOVE BIO SURGEON STRL SZ7.5 (GLOVE) ×2 IMPLANT
GOWN STRL REUS W/TWL XL LVL3 (GOWN DISPOSABLE) ×2 IMPLANT
GUIDEWIRE ANG ZIPWIRE 038X150 (WIRE) IMPLANT
GUIDEWIRE STR DUAL SENSOR (WIRE) ×2 IMPLANT
KIT TURNOVER KIT A (KITS) IMPLANT
MANIFOLD NEPTUNE II (INSTRUMENTS) ×2 IMPLANT
PACK CYSTO (CUSTOM PROCEDURE TRAY) ×2 IMPLANT
SHEATH URETERAL 12FRX28CM (UROLOGICAL SUPPLIES) IMPLANT
SHEATH URETERAL 12FRX35CM (MISCELLANEOUS) IMPLANT
STENT CONTOUR 6FRX24X.038 (STENTS) ×2 IMPLANT
TUBING UROLOGY SET (TUBING) ×2 IMPLANT

## 2019-05-11 NOTE — Transfer of Care (Signed)
Immediate Anesthesia Transfer of Care Note  Patient: Samantha Mcbride  Procedure(s) Performed: CYSTOSCOPY WITH RETROGRADE PYELOGRAM,  AND STENT PLACEMENT (Bilateral )  Patient Location: PACU  Anesthesia Type:General  Level of Consciousness: awake, drowsy and responds to stimulation  Airway & Oxygen Therapy: Patient Spontanous Breathing and Patient connected to face mask oxygen  Post-op Assessment: Report given to RN, Post -op Vital signs reviewed and stable and heart rate still in 120's.     Post vital signs: Reviewed and stable  Last Vitals:  Vitals Value Taken Time  BP 135/74 05/11/19 1946  Temp    Pulse 125 05/11/19 1948  Resp 15 05/11/19 1948  SpO2 100 % 05/11/19 1948  Vitals shown include unvalidated device data.  Last Pain:  Vitals:   05/11/19 1852  TempSrc: Oral  PainSc:       Patients Stated Pain Goal: 4 (16/07/37 1062)  Complications: No apparent anesthesia complications

## 2019-05-11 NOTE — ED Provider Notes (Signed)
[facility] EMERGENCY DEPARTMENT Provider Note   CSN: [id number] Arrival date & time: 05/11/19  0803    History   Chief Complaint No chief complaint on file.   HPI Samantha Mcbride is a 68 y.o. female with history of CAD anterior STEMI complicated by arrest s/p angioplasty, HLD, HTN, obesity, rheumatoid arthritis presents to the ER for evaluation of nausea associated with vomiting, abdominal pain.  Nausea and vomiting began on Saturday, persistent, more than 10 times in the last 24 hours, nonbloody nonbilious without coffee grounds.  Abdominal pain is constant, achy localized to the right lower abdomen 7/10.  Has noticed some right low buttock/back pain as well but feels like this is not related.  No interventions.  No alleviating or aggravating factors.  She has not been able to keep any fluids or food down.  Recently started taking iron and states her BMs have slowed down and are smaller, she had a BM yesterday and today without any melena or blood.  Denies any dysuria, hematuria, frequency or urgency.  No h/o kidney stones. History of perforated appendicitis s/p appendectomy, hernia repair.  Reports many years ago she had a known ovarian cyst and thinks she had it removed but is unsure, this pain feels slightly similar.  No vaginal bleeding.  No fever, cough, congestion, CP, SOB. No sick contacts with similar symptoms. No exposure to suspected/confirmed COVID. No exposure to suspicious new foods.     HPI  Past Medical History:  Diagnosis Date  . CAD S/P percutaneous coronary angioplasty November 2012   Promus DES - mid LAD 3.5 mm x 24 mm (3.72 mm)   . Dyslipidemia, goal LDL below 70     on statin, close to goal  . Former moderate cigarette smoker (10-19 per day)   . Glucose intolerance (impaired glucose tolerance)   . H/O thyroid nodule    Benign  . History of ST elevation myocardial infarction (STEMI) of anterior wall November 2012   With cardiac arrest, 100%  mobility occlusion. --> Promus DES   . Hypertension     Patient Active Problem List   Diagnosis Date Noted  . Obesity (BMI 30-39.9) 07/18/2013  . Essential hypertension   . Dyslipidemia, goal LDL below 70 07/16/2013  . Glucose intolerance (impaired glucose tolerance) 07/16/2013  . Ventricular tachycardia, sustained; Peri-infarct.  No further episodes 08/28/2011  . Hypokalemia 08/27/2011  . H/O Anterior STEMI:  Proximal LAD  insertion of a 3.5x24 mm Promus element DES (08/2011) 08/27/2011  . CAD S/P percutaneous coronary angioplasty 08/16/2011    Past Surgical History:  Procedure Laterality Date  . ABDOMINAL AORTAGRAM N/A 08/27/2011   Procedure: ABDOMINAL Maxcine Ham;  Surgeon: Troy Sine, MD;  Location: [facility] CATH LAB;  Service: Cardiovascular;  Laterality: N/A;  . BREAST EXCISIONAL BIOPSY Left   . CARDIAC CATHETERIZATION  November 2012   For anterior STEMI/cardiac arrest -- 100% mid LAD occlusion  . CORONARY ANGIOPLASTY WITH STENT PLACEMENT  November 2012    mid LAD PCI --> Promus Element DES 3.5 mm at 24 mm (3.72 mm)  . LEFT HEART CATHETERIZATION WITH CORONARY ANGIOGRAM N/A 08/27/2011   Procedure: LEFT HEART CATHETERIZATION WITH CORONARY ANGIOGRAM;  Surgeon: Troy Sine, MD;  Location[facility] CATH LAB;  Service: Cardiovascular;  Laterality: N/A;  . PERCUTANEOUS CORONARY STENT INTERVENTION (PCI-S) N/A 08/27/2011   Procedure: PERCUTANEOUS CORONARY STENT INTERVENTION (PCI-S);  Surgeon: Troy Sine, MD;  Location: [facility] CATH LAB;  Service: Cardiovascular;  Laterality: N/A;  .  TRANSTHORACIC ECHOCARDIOGRAM  November 2012   EF 40-45%, moderate at K. of mid and distal inferior septum and anterior apical myocardium. Grade 1 diastolic function. -- Followup echocardiogram to reassess his EF was denied by insurance company     OB History   No obstetric history on file.      Home Medications    Prior to Admission medications   Medication Sig Start Date End Date Taking? Authorizing  Provider  Adalimumab (HUMIRA PEN) 40 MG/0.4ML PNKT Inject 40 mg into the skin every 21 ( twenty-one) days.    [provider]  atorvastatin (LIPITOR) 20 MG tablet TAKE 1 TABLET BY MOUTH EVERYDAY AT BEDTIME 10/13/18   Leonie Man, MD  atorvastatin (LIPITOR) 40 MG tablet TAKE 1 TABLET BY MOUTH AT BEDTIME 10/29/18   Leonie Man, MD  carvedilol (COREG) 6.25 MG tablet TAKE 1 TABLET BY MOUTH TWICE A DAY WITH A MEAL 09/26/18   Leonie Man, MD  clopidogrel (PLAVIX) 75 MG tablet TAKE 1 TABLET BY MOUTH DAILY 08/27/18   Leonie Man, MD  diclofenac (VOLTAREN) 0.1 % ophthalmic solution every 6 (six) hours.    [provider]  folic acid (FOLVITE) 1 MG tablet Take 1 mg daily by mouth. 08/13/17   [provider]  lisinopril (PRINIVIL,ZESTRIL) 2.5 MG tablet TAKE 1 TABLET BY MOUTH EVERY DAY 08/01/18   Leonie Man, MD  methotrexate (RHEUMATREX) 2.5 MG tablet Take 17.5 mg by mouth once a week. Caution:Chemotherapy. Protect from light.    [provider]  nitroGLYCERIN (NITROSTAT) 0.4 MG SL tablet PLACE 1 TABLET UNDER THE TONGUE EVERY 5 MINUTES AS NEEDED FOR CHEST PAIN. 08/01/15   Leonie Man, MD  promethazine (PHENERGAN) 25 MG suppository Place 1 suppository (25 mg total) rectally every 6 (six) hours as needed for nausea or vomiting. 05/11/19   Kinnie Feil, PA-C  tiZANidine (ZANAFLEX) 2 MG tablet Take 2 mg by mouth as needed for muscle spasms.    [provider]  traZODone (DESYREL) 100 MG tablet Take by mouth at bedtime. Dose unknown     [provider]    Family History Family History  Problem Relation Age of Onset  . Cancer Mother   . Hypertension Mother   . Breast cancer Mother        early 58's  . Heart failure Father   . Breast cancer Sister        unsure of age    Social History Social History   Tobacco Use  . Smoking status: Former Smoker    Packs/day: 1.00    Years: 40.00    Pack years: 40.00    Types:  Cigarettes    Quit date: 08/27/2011    Years since quitting: 7.7  . Smokeless tobacco: Never Used  Substance Use Topics  . Alcohol use: No  . Drug use: No     Allergies   Patient has no known allergies.   Review of Systems Review of Systems  Gastrointestinal: Positive for abdominal pain, nausea and vomiting.  Musculoskeletal: Positive for back pain.  All other systems reviewed and are negative.    Physical Exam Updated Vital Signs BP (!) 179/85   Pulse 94   Temp 99.1 F (37.3 C) (Oral)   Resp 20   SpO2 95%   Physical Exam Vitals signs and nursing note reviewed.  Constitutional:      Appearance: She is well-developed.     Comments: Non toxic in NAD  HENT:     Head: Normocephalic and atraumatic.     Nose: Nose normal.  Eyes:     Conjunctiva/sclera: Conjunctivae normal.  Neck:     Musculoskeletal: Normal range of motion.  Cardiovascular:     Rate and Rhythm: Normal rate and regular rhythm.  Pulmonary:     Effort: Pulmonary effort is normal.     Breath sounds: Normal breath sounds.  Abdominal:     General: Bowel sounds are normal.     Palpations: Abdomen is soft.     Tenderness: There is abdominal tenderness.     Comments: Mild TTP to right of umbilicus. Obese abdomen. No G/R/R. No suprapubic or CVA tenderness. Negative Murphy's and McBurney's. Unable to hear BS to lower quadrants difficult exam due to body habitus  Musculoskeletal: Normal range of motion.     Comments: L spine: no midline or paraspinal muscular tenderness   Skin:    General: Skin is warm and dry.     Capillary Refill: Capillary refill takes less than 2 seconds.  Neurological:     Mental Status: She is alert.     Comments: Sensation and strength intact in upper/lower extremities   Psychiatric:        Behavior: Behavior normal.      ED Treatments / Results  Labs (all labs ordered are listed, but only abnormal results are displayed) Labs Reviewed  CBC WITH DIFFERENTIAL/PLATELET -  Abnormal; Notable for the following components:      Result Value   WBC 16.1 (*)    RBC 3.19 (*)    Hemoglobin 10.0 (*)    HCT 30.9 (*)    Neutro Abs 15.1 (*)    Lymphs Abs 0.5 (*)    Abs Immature Granulocytes 0.17 (*)    All other components within normal limits  COMPREHENSIVE METABOLIC PANEL - Abnormal; Notable for the following components:   Glucose, Bld 174 (*)    Creatinine, Ser 2.75 (*)    Albumin 3.4 (*)    GFR calc non Af Amer 17 (*)    GFR calc Af Amer 20 (*)    All other components within normal limits  URINALYSIS, ROUTINE W REFLEX MICROSCOPIC - Abnormal; Notable for the following components:   APPearance HAZY (*)    Glucose, UA 50 (*)    Hgb urine dipstick MODERATE (*)    Protein, ur 30 (*)    Leukocytes,Ua MODERATE (*)    WBC, UA >50 (*)    Bacteria, UA RARE (*)    All other components within normal limits  URINE CULTURE  SARS CORONAVIRUS 2 (HOSPITAL ORDER, [facility])  LIPASE, BLOOD    EKG None  Radiology Ct Abdomen Pelvis Wo Contrast  Result Date: 05/11/2019 CLINICAL DATA:  Right flank pain.  Renal failure. EXAM: CT ABDOMEN AND PELVIS WITHOUT CONTRAST TECHNIQUE: Multidetector CT imaging of the abdomen and pelvis was performed following the standard protocol without oral or IV contrast. COMPARISON:  None. FINDINGS: Lower chest: There is mild bibasilar atelectasis. There is no lung base edema or consolidation. There is a small amount of pericardial effusion which may be within the physiologic range. There is a small hiatal hernia. Hepatobiliary: No focal liver lesions are demonstrable on this noncontrast enhanced study. Gallbladder is upper normal in size without wall thickening or apparent pericholecystic fluid by CT. No biliary duct dilatation evident. Pancreas: No evident pancreatic mass or inflammatory focus. Spleen: No splenic lesions evident. Adrenals/Urinary Tract: Adrenals bilaterally appear normal.  Right kidney is edematous with  perinephric stranding on the right and mild perinephric fluid on the right. No evident renal mass on either side. There is mild hydronephrosis on the right. There is minimal hydronephrosis on the left. There is no intrarenal calculus on either side. On the right, there is a calculus in the distal right ureter at the superior acetabular level measuring 4 x 3 mm. On the left, there is a 3 x 2 mm calculus in the distal left ureter, also at the upper acetabular level. No other ureteral calculi are evident on either side. Urinary bladder is midline with wall thickness within normal limits. Stomach/Bowel: There is moderate stool in the colon. There is no appreciable bowel wall or mesenteric thickening. No evident bowel obstruction. There is lipomatous infiltration of the ileocecal valve. Terminal ileum appears unremarkable. No free air or portal venous air. Vascular/Lymphatic: There are scattered foci of aortic and iliac artery atherosclerotic calcification. No aneurysm evident. There is no adenopathy in the abdomen or pelvis. Reproductive: The uterus is midline and. There are apparent tubal ligation clips. No pelvic mass evident. Other: Appendix is not seen. No periappendiceal region inflammatory change evident. No abscess or ascites is evident in the abdomen or pelvis. Musculoskeletal: There is degenerative change in the lumbar spine. There are no blastic or lytic bone lesions. There is no intramuscular or abdominal wall lesion. IMPRESSION: 1. There is a 4 x 3 mm calculus in the distal right ureter at the superior acetabular level with fairly mild hydronephrosis on the right. The right kidney is edematous with perinephric stranding and fluid. 2. 3 x 2 mm calculus distal left ureter at the superior acetabular level with slight hydronephrosis on the left. Minimal perinephric stranding on the left. 3. No bowel obstruction. No abscess in the abdomen or pelvis. No periappendiceal region inflammatory region change. 4.  Foci  of aortic and iliac artery atherosclerosis noted. 5. Small amount of pericardial fluid which may be within physiologic range. 6.  Small hiatal hernia. Electronically Signed   By: Lowella Grip III M.D.   On: 05/11/2019 10:45    Procedures .Critical Care Performed by: Kinnie Feil, PA-C Authorized by: Kinnie Feil, PA-C   Critical care provider statement:    Critical care time (minutes):  45   Critical care was necessary to treat or prevent imminent or life-threatening deterioration of the following conditions:  Renal failure (AKI 2/2 bilateral kidney stones, urine infection, requiring same day urology procedure)   Critical care was time spent personally by me on the following activities:  Discussions with consultants, evaluation of patient's response to treatment, examination of patient, ordering and performing treatments and interventions, ordering and review of laboratory studies, ordering and review of radiographic studies, pulse oximetry, re-evaluation of patient's condition, obtaining history from patient or surrogate, review of old charts and development of treatment plan with patient or surrogate   I assumed direction of critical care for this patient from another provider in my specialty: no     (including critical care time)  Medications Ordered in ED Medications  ondansetron (ZOFRAN) injection 4 mg (4 mg Intravenous Given 05/11/19 0840)  morphine 4 MG/ML injection 4 mg (4 mg Intravenous Given 05/11/19 0840)  sodium chloride 0.9 % bolus 1,000 mL (1,000 mLs Intravenous New Bag/Given 05/11/19 1042)     Initial Impression / Assessment and Plan / ED Course  I have reviewed the triage vital signs and the nursing notes.  Pertinent labs & imaging results that  were available during my care of the patient were reviewed by me and considered in my medical decision making (see chart for details).  ddx includes viral gastroenteritis vs GERD/gastritis. She is s/p appendectomy. No  RUQ or epigastric tenderness, bilious vomiting and cholecystitis is less likely. No lower quadrant tenderness, diarrhea, melena, hematochezia and diverticulitis less likely.  She does report harder, smaller stools from iron which could be contributing. Given benign exam, lower suspicious for SBO, perforated viscus. No UTI symptoms. No h/o kidney stones.    Labs, UA, morphine, zofran ordered. Will plan to reassess. Low threshold for imaging given age, h/o surgeries in the past putting her at risk for adhesions.   ER work-up reviewed by me.  Remarkable for leukocytosis 16.1, acute kidney injury creatinine 2.75, GFR 17.  Previously kidney function has been normal.  UA is concerning for infection.  CT showed bilateral ureteral stones with hydro-on the right, moderate stool burden and hiatal hernia.  Gallbladder is normal.  Patient reevaluated and has improved 3/10 pain, no more nausea or vomiting.  I discussed patient with Dr. Gloriann Loan with urology who has reviewed patient's chart.  He recommends same-day procedure to address stones.  He recommended patient be discharged from the ER and go to his office by POV for likely procedure, possible overnight observation for AKI.  Did not think a medical admission was indicated here at [facility].  Discussed plan with patient who is more comfortable going to Dr. Purvis Sheffield office by POV.  Daughter to drive her.  COVID test ordered in the ER.  Patient will likely need a Rocephin but will defer further treatment to Dr. Gloriann Loan to prevent delaying transfer and neurology evaluation.  Pt shared with EDP. Final Clinical Impressions(s) / ED Diagnoses   Final diagnoses:  Bilateral ureteral calculi  Nausea and vomiting in adult  Drug-induced constipation  Hiatal hernia  Acute kidney injury [facility])    ED Discharge Orders         Ordered    promethazine (PHENERGAN) 25 MG suppository  Every 6 hours PRN     05/11/19 1127           Arlean Hopping 05/11/19 1134     Blanchie Dessert, MD 05/11/19 2130

## 2019-05-11 NOTE — Op Note (Signed)
Operative Note  Preoperative diagnosis:  1.  Bilateral ureteral calculus  Post operative diagnosis: 1.  Bilateral ureteral calculus  Procedure(s): 1.  Cystoscopy with bilateral retrograde pyelogram and bilateral ureteral stent placement  Surgeon: Link Snuffer, MD  Assistants: None  Anesthesia: General  Complications: None immediate  EBL: Minimal  Specimens: 1.  Urine culture  Drains/Catheters: 1.  6 X 24 double-J ureteral stent bilaterally  Intraoperative findings: 1.  Normal urethra and bladder 2.  Bilateral retrograde pyelogram revealed a filling defect at the level of the stone with mild upstream hydroureteronephrosis.  The right ureter was effluxing cloudy urine  Indication: 68 year old female with bilateral ureteral calculi and acute renal insufficiency as well as a fever presents for the previously mentioned operation.  Description of procedure:  The patient was identified and consent was obtained.  The patient was taken to the operating room and placed in the supine position.  The patient was placed under general anesthesia.  Perioperative antibiotics were administered.  The patient was placed in dorsal lithotomy.  Patient was prepped and draped in a standard sterile fashion and a timeout was performed.  A 21 French rigid cystoscope was advanced into the urethra and into the bladder.  The left distal most portion of the ureter was cannulated with an open-ended ureteral catheter.  Retrograde pyelogram was performed with the findings noted above.  A sensor wire was then advanced up to the kidney under fluoroscopic guidance.  A 6 X 24 double-J ureteral stent was advanced up to the kidney under fluoroscopic guidance.  The wire was withdrawn and fluoroscopy confirmed good proximal placement and direct visualization confirmed a good coil within the bladder.    The right distal most portion of the ureter was cannulated with an open-ended ureteral catheter.  Retrograde pyelogram  was performed with the findings noted above.  A sensor wire was then advanced up to the kidney under fluoroscopic guidance.  A 6 X 24 double-J ureteral stent was advanced up to the kidney under fluoroscopic guidance.  The wire was withdrawn and fluoroscopy confirmed good proximal placement and direct visualization confirmed a good coil within the bladder.  The bladder was drained and the scope withdrawn.  This concluded the operation.  Patient tolerated procedure well and was stable postoperatively.  Plan: Admit to the hospital for IV antibiotics and follow cultures.  Also follow renal function.

## 2019-05-11 NOTE — ED Triage Notes (Signed)
States N/V x 3 days, no noted diarrhea.  Able to keep only water down this AM.  RLQ abd pain extending to flank.  No changes in urinary pattern.

## 2019-05-11 NOTE — ED Notes (Signed)
Patient transported to CT 

## 2019-05-11 NOTE — ED Notes (Signed)
Pt discharged to go to Urology office at 1300 and admission to [facility] for possible stent placement.  20 g. IV SL left in place and secured with coban.  Pt instructed to not eat or drink and to leave the SL in place.

## 2019-05-11 NOTE — ED Notes (Signed)
Pt found out this week she has renal dx, has not had any dialysis nor fistula in place.  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

## 2019-05-11 NOTE — Discharge Instructions (Signed)

## 2019-05-11 NOTE — Anesthesia Postprocedure Evaluation (Signed)
Anesthesia Post Note  Patient: Samantha Mcbride  Procedure(s) Performed: CYSTOSCOPY WITH RETROGRADE PYELOGRAM,  AND STENT PLACEMENT (Bilateral )     Patient location during evaluation: PACU Anesthesia Type: General Level of consciousness: awake and alert and oriented Pain management: pain level controlled Vital Signs Assessment: post-procedure vital signs reviewed and stable Respiratory status: spontaneous breathing, nonlabored ventilation, respiratory function stable and patient connected to nasal cannula oxygen Cardiovascular status: blood pressure returned to baseline and stable Postop Assessment: no apparent nausea or vomiting Anesthetic complications: no    Last Vitals:  Vitals:   05/11/19 2100 05/11/19 2138  BP: 134/78 (!) 146/75  Pulse: (!) 120 86  Resp: 18 20  Temp: 37.2 C 36.9 C  SpO2: 99% 98%    Last Pain:  Vitals:   05/11/19 2150  TempSrc:   PainSc: 0-No pain                 Conn Trombetta A.

## 2019-05-11 NOTE — H&P (Signed)
CC/HPI: Cc: Bilateral ureteral calculi.  HPI:  68 year old female with bilateral ureteral calculi diagnosed today in the emergency department. She has bilateral 4-5 mm distal ureteral calculi seen by CT scan. She was noted to have acute renal insufficiency with a creatinine of 2.75. Leukocytosis of 16. Urinalysis here is not really consistent with infection. She has positive leukocytes but no bacteria. 10-20 WBCs. She denies any fever. However, she has been having some nausea and vomiting. This is her first stone.     ALLERGIES: No Known Drug Allergies    MEDICATIONS: Atorvastatin Calcium  Carvedilol  Clopidogrel 75 mg tablet  Humira  Iron  Melatonin  Nitrostat 0.4 mg tablet, sublingual  Tizanidine Hcl 2 mg capsule  Trazodone Hcl 100 mg tablet  Voltaren     GU PSH: None   NON-GU PSH: Appendectomy (laparoscopic) Hernia Repair     GU PMH: Kidney Failure Unspec Renal calculus    NON-GU PMH: Arthritis Hypercholesterolemia Hypertension Myocardial Infarction    FAMILY HISTORY: Breast Cancer - Mother   SOCIAL HISTORY: Marital Status: Single Preferred Language: English; Race: White Current Smoking Status: Patient does not smoke anymore. Has not smoked since 04/15/2011.   Tobacco Use Assessment Completed: Used Tobacco in last 30 days? Drinks 2 caffeinated drinks per day.    REVIEW OF SYSTEMS:    GU Review Female:   Patient reports frequent urination, hard to postpone urination, get up at night to urinate, leakage of urine, trouble starting your stream, and have to strain to urinate. Patient denies burning /pain with urination, stream starts and stops, and being pregnant.  Gastrointestinal (Upper):   Patient reports nausea and vomiting. Patient denies indigestion/ heartburn.  Gastrointestinal (Lower):   Patient reports constipation. Patient denies diarrhea.  Constitutional:   Patient reports fatigue. Patient denies fever, night sweats, and weight loss.  Skin:   Patient denies  skin rash/ lesion and itching.  Eyes:   Patient denies blurred vision and double vision.  Ears/ Nose/ Throat:   Patient denies sinus problems and sore throat.  Hematologic/Lymphatic:   Patient reports easy bruising. Patient denies swollen glands.  Cardiovascular:   Patient denies leg swelling and chest pains.  Respiratory:   Patient denies cough and shortness of breath.  Endocrine:   Patient denies excessive thirst.  Musculoskeletal:   Patient reports joint pain. Patient denies back pain.  Neurological:   Patient denies headaches and dizziness.  Psychologic:   Patient denies depression and anxiety.   Notes: UTI    VITAL SIGNS:      05/11/2019 01:57 PM  Weight 190 lb / 86.18 kg  Height 61 in / 154.94 cm  BP 152/82 mmHg  Heart Rate 105 /min  Temperature 99.8 F / 37.6 C  BMI 35.9 kg/m   MULTI-SYSTEM PHYSICAL EXAMINATION:    Constitutional: Well-nourished. No physical deformities. Normally developed. Good grooming.   Respiratory: No labored breathing, no use of accessory muscles.   Cardiovascular: Normal temperature, adequate perfusion of extremities  Skin: No paleness, no jaundice  Neurologic / Psychiatric: Oriented to time, oriented to place, oriented to person. No depression, no anxiety, no agitation.  Gastrointestinal: No mass, no tenderness, no rigidity, obese abdomen. No CVA tenderness bilaterally  Eyes: Normal conjunctivae. Normal eyelids.  Musculoskeletal: Normal gait and station of head and neck.     PAST DATA REVIEWED:  Source Of History:  Patient  Lab Test Review:   BMP, CBC with Diff  Records Review:   Previous Doctor Records, Previous Patient Records  Urine  Test Review:   Urinalysis  X-Ray Review: C.T. Abdomen/Pelvis: Reviewed Films. Reviewed Report. Discussed With Patient.     PROCEDURES: None   ASSESSMENT:      ICD-10 Details  1 GU:   Ureteral calculus - R03.0   2   Renal colic - B49   3   Acute kidney failure - N17.0    PLAN:            Document Letter(s):  Created for Patient: Clinical Summary         Notes:   Given the bilateral ureteral stones, coupled with acute renal insufficiency, recommend intervention. We will proceed to the operating room tonight with cystoscopy, bilateral ureteroscopy, bilateral laser lithotripsy, ureteral stent placement. Generous as potential for bleeding, infection, injury to surrounding structures, need for additional procedures.   Cc: Antony Contras, M.D.   Signed by Link Snuffer, III, M.D. on 05/11/19 at 2:45 PM (EDT   Update: The patient was febrile in the PACU.  Therefore, proceed with bilateral ureteral stent placement and admission for antibiotics.

## 2019-05-11 NOTE — Anesthesia Procedure Notes (Signed)
Procedure Name: LMA Insertion Date/Time: 05/11/2019 7:12 PM Performed by: Lollie Sails, CRNA Pre-anesthesia Checklist: Patient identified, Emergency Drugs available, Suction available, Patient being monitored and Timeout performed Patient Re-evaluated:Patient Re-evaluated prior to induction Oxygen Delivery Method: Circle system utilized Preoxygenation: Pre-oxygenation with 100% oxygen Induction Type: IV induction LMA: LMA inserted LMA Size: 4.0 Number of attempts: 1 Placement Confirmation: positive ETCO2 and breath sounds checked- equal and bilateral Tube secured with: Tape Dental Injury: Teeth and Oropharynx as per pre-operative assessment

## 2019-05-11 NOTE — Discharge Instructions (Signed)
You were seen in the ER for nausea, vomiting and abdominal pain.  Work-up today reveals that you have kidney stones on your left and your right.  Your urine also looked infected.  Your kidney function is elevated.  This is likely all related to your kidney stones that you have not been able to pass.  I spoke to Dr. Gloriann Loan with urology at [facility] who recommended she go to his office today to discuss options potentially procedure to remove the stones.  Go to his office immediately for further management.  Return to the ER for fever greater than 100, persistent or worsening vomiting, difficulty urinating  CT showed a hiatal hernia which can predispose you to acid reflux, regurgitation however today hiatal hernia did not look to have any complications.  CT also showed a moderate stool in your colon, recommend a daily stool softener especially if you are taking iron.

## 2019-05-11 NOTE — ED Notes (Signed)
Ambulatory to BR with no assist needed.

## 2019-05-11 NOTE — Anesthesia Preprocedure Evaluation (Signed)
Anesthesia Evaluation  Patient identified by MRN, date of birth, ID band Patient awake    Reviewed: Allergy & Precautions, NPO status , Patient's Chart, lab work & pertinent test results, reviewed documented beta blocker date and time   History of Anesthesia Complications (+) PONV and history of anesthetic complications  Airway Mallampati: III  TM Distance: >3 FB Neck ROM: Full    Dental no notable dental hx. (+) Teeth Intact, Caps   Pulmonary former smoker,    Pulmonary exam normal breath sounds clear to auscultation       Cardiovascular hypertension, Pt. on medications and Pt. on home beta blockers + CAD, + Past MI and + Cardiac Stents  Normal cardiovascular exam Rhythm:Regular Rate:Normal  Hx/o STEMI 08/2011 DES x 1 LAD Hx/o V Tach arrest   Neuro/Psych negative neurological ROS  negative psych ROS   GI/Hepatic negative GI ROS, Neg liver ROS,   Endo/Other  Obesity Hyperlipidemia  Renal/GU Renal InsufficiencyRenal diseaseBilateral ureteral calculi  negative genitourinary   Musculoskeletal negative musculoskeletal ROS (+)   Abdominal (+) + obese,   Peds  Hematology  (+) anemia , Plavix therapy - last dose 05/09/2019   Anesthesia Other Findings   Reproductive/Obstetrics                             Anesthesia Physical Anesthesia Plan  ASA: III  Anesthesia Plan: General   Post-op Pain Management:    Induction: Intravenous  PONV Risk Score and Plan: 4 or greater and Ondansetron, Treatment may vary due to age or medical condition, Dexamethasone, Midazolam and Scopolamine patch - Pre-op  Airway Management Planned: LMA  Additional Equipment:   Intra-op Plan:   Post-operative Plan: Extubation in OR  Informed Consent: I have reviewed the patients History and Physical, chart, labs and discussed the procedure including the risks, benefits and alternatives for the proposed anesthesia  with the patient or authorized representative who has indicated his/her understanding and acceptance.     Dental advisory given  Plan Discussed with: CRNA and Surgeon  Anesthesia Plan Comments:         Anesthesia Quick Evaluation

## 2019-05-12 ENCOUNTER — Encounter (HOSPITAL_COMMUNITY): Payer: Self-pay | Admitting: Urology

## 2019-05-12 DIAGNOSIS — N201 Calculus of ureter: Secondary | ICD-10-CM

## 2019-05-12 LAB — HIV ANTIBODY (ROUTINE TESTING W REFLEX): HIV Screen 4th Generation wRfx: NONREACTIVE

## 2019-05-12 LAB — BASIC METABOLIC PANEL
Anion gap: 8 (ref 5–15)
Anion gap: 8 (ref 5–15)
BUN: 29 mg/dL — ABNORMAL HIGH (ref 8–23)
BUN: 34 mg/dL — ABNORMAL HIGH (ref 8–23)
CO2: 24 mmol/L (ref 22–32)
CO2: 26 mmol/L (ref 22–32)
Calcium: 8.1 mg/dL — ABNORMAL LOW (ref 8.9–10.3)
Calcium: 8 mg/dL — ABNORMAL LOW (ref 8.9–10.3)
Chloride: 107 mmol/L (ref 98–111)
Chloride: 107 mmol/L (ref 98–111)
Creatinine, Ser: 2.99 mg/dL — ABNORMAL HIGH (ref 0.44–1.00)
Creatinine, Ser: 3.05 mg/dL — ABNORMAL HIGH (ref 0.44–1.00)
GFR calc Af Amer: 18 mL/min — ABNORMAL LOW (ref >60)
GFR calc Af Amer: 18 mL/min — ABNORMAL LOW (ref >60)
GFR calc non Af Amer: 15 mL/min — ABNORMAL LOW (ref >60)
GFR calc non Af Amer: 15 mL/min — ABNORMAL LOW (ref >60)
Glucose, Bld: 148 mg/dL — ABNORMAL HIGH (ref 70–99)
Glucose, Bld: 157 mg/dL — ABNORMAL HIGH (ref 70–99)
Potassium: 3.5 mmol/L (ref 3.5–5.1)
Potassium: 3.4 mmol/L — ABNORMAL LOW (ref 3.5–5.1)
Sodium: 139 mmol/L (ref 135–145)
Sodium: 141 mmol/L (ref 135–145)

## 2019-05-12 LAB — CBC
HCT: 28.3 % — ABNORMAL LOW (ref 36.0–46.0)
Hemoglobin: 8.8 g/dL — ABNORMAL LOW (ref 12.0–15.0)
MCH: 31.8 pg (ref 26.0–34.0)
MCHC: 31.1 g/dL (ref 30.0–36.0)
MCV: 102.2 fL — ABNORMAL HIGH (ref 80.0–100.0)
Platelets: 169 10*3/uL (ref 150–400)
RBC: 2.77 MIL/uL — ABNORMAL LOW (ref 3.87–5.11)
RDW: 12.7 % (ref 11.5–15.5)
WBC: 15.3 10*3/uL — ABNORMAL HIGH (ref 4.0–10.5)
nRBC: 0 % (ref 0.0–0.2)

## 2019-05-12 MED ORDER — SODIUM CHLORIDE 0.9 % IV BOLUS
1000.0000 mL | Freq: Once | INTRAVENOUS | Status: AC
  Administered 2019-05-12: 1000 mL via INTRAVENOUS

## 2019-05-12 MED ORDER — PNEUMOCOCCAL VAC POLYVALENT 25 MCG/0.5ML IJ INJ
0.5000 mL | INJECTION | Freq: Once | INTRAMUSCULAR | Status: AC
  Administered 2019-05-12: 0.5 mL via INTRAMUSCULAR
  Filled 2019-05-12: qty 0.5

## 2019-05-12 NOTE — Plan of Care (Signed)

## 2019-05-12 NOTE — Consult Note (Signed)
[facility] Medical Consultation  Samantha Mcbride JXB:[id number] DOB: 1951/08/08 DOA: 05/11/2019 PCP: Antony Contras, MD   Requesting physician: Dr Gloriann Loan  Date of consultation: 05/12/2019 Reason for consultation: AKI, UTI  Impression/Recommendations Active Problems:   Ureteral calculus    1. Acute kidney injury in a patient with history of chronic kidney disease stage III from hypertension: Probably prerenal.  Patient had episode of hypotension in operating room during procedure. Patient is clinically stable.  She has adequate urine output.  No evidence of urinary retention. We will give 1 more liter of normal saline bolus and keep on maintenance IV fluids overnight. Patient is not on any nephrotoxins.  Discontinued NSAID's and Toradol. I called and reviewed her results from her nephrologist office since last 1 year and her creatinine was 2.24. Recheck levels in the morning to ensure stabilization.  2.. Acute UTI present on admission: Growing E. coli.  On Rocephin.  Reasonable to continue until clinical improvement.  3.  Bilateral ureteric stent: Status post ureteric stent placement with good results.  As per surgery.  4.  Hypertension: Blood pressures are stable.  Continue Coreg.   We will followup again tomorrow. Please contact me if I can be of assistance in the meanwhile. Thank you for this consultation.  Chief Complaint: Persistent vomiting.  HPI:  Patient with history of hypertension, rheumatoid arthritis on Humira, coronary artery disease, hyperlipidemia and history of STEMI who had multiple episodes of intermittent vomiting since last 2 days and came to the emergency room.  In the emergency room she was found with acute kidney injury, abnormal urinalysis, CT scan abdomen pelvis with bilateral ureteric stone with mild hydronephrosis.  She was admitted treated with IV antibiotics, underwent bilateral ureteric stent placement with good results.  E. coli is growing in the urine.   She had few episodes of low blood pressure otherwise nothing significant events postop.  Patient was given fluid boluses and albumin postop. Today, she is clinically improving.  She has no more nausea or vomiting.  She is eating her lunch.  Denies any abdominal pain.  No urinary symptoms today.  She had temperature 101 overnight.  Patient denies any cough or shortness of breath.  Her repeat lab test shows elevated creatinine of 3, hence medical consultation.  Review of Systems:  Presently denies any complaints.  Past Medical History:  Diagnosis Date  . CAD S/P percutaneous coronary angioplasty November 2012   Promus DES - mid LAD 3.5 mm x 24 mm (3.72 mm)   . Complication of anesthesia   . Dyslipidemia, goal LDL below 70     on statin, close to goal  . Former moderate cigarette smoker (10-19 per day)   . Glucose intolerance (impaired glucose tolerance)   . H/O thyroid nodule    Benign  . History of ST elevation myocardial infarction (STEMI) of anterior wall November 2012   With cardiac arrest, 100% mobility occlusion. --> Promus DES   . Hypertension   . PONV (postoperative nausea and vomiting)    Past Surgical History:  Procedure Laterality Date  . ABDOMINAL AORTAGRAM N/A 08/27/2011   Procedure: ABDOMINAL Maxcine Ham;  Surgeon: Troy Sine, MD;  Location: [facility] CATH LAB;  Service: Cardiovascular;  Laterality: N/A;  . BREAST EXCISIONAL BIOPSY Left   . CARDIAC CATHETERIZATION  November 2012   For anterior STEMI/cardiac arrest -- 100% mid LAD occlusion  . CORONARY ANGIOPLASTY WITH STENT PLACEMENT  November 2012    mid LAD PCI --> Promus Element DES 3.5  mm at 24 mm (3.72 mm)  . CYSTOSCOPY WITH RETROGRADE PYELOGRAM, URETEROSCOPY AND STENT PLACEMENT Bilateral 05/11/2019   Procedure: CYSTOSCOPY WITH RETROGRADE PYELOGRAM,  AND STENT PLACEMENT;  Surgeon: Lucas Mallow, MD;  Location: [facility] ORS;  Service: Urology;  Laterality: Bilateral;  . LEFT HEART CATHETERIZATION WITH CORONARY ANGIOGRAM N/A  08/27/2011   Procedure: LEFT HEART CATHETERIZATION WITH CORONARY ANGIOGRAM;  Surgeon: Troy Sine, MD;  Location: [facility] CATH LAB;  Service: Cardiovascular;  Laterality: N/A;  . PERCUTANEOUS CORONARY STENT INTERVENTION (PCI-S) N/A 08/27/2011   Procedure: PERCUTANEOUS CORONARY STENT INTERVENTION (PCI-S);  Surgeon: Troy Sine, MD;  Location: [facility] CATH LAB;  Service: Cardiovascular;  Laterality: N/A;  . TRANSTHORACIC ECHOCARDIOGRAM  November 2012   EF 40-45%, moderate at K. of mid and distal inferior septum and anterior apical myocardium. Grade 1 diastolic function. -- Followup echocardiogram to reassess his EF was denied by insurance company   Social History:  reports that she quit smoking about 7 years ago. Her smoking use included cigarettes. She has a 40.00 pack-year smoking history. She has never used smokeless tobacco. She reports that she does not drink alcohol or use drugs.  No Known Allergies Family History  Problem Relation Age of Onset  . Cancer Mother   . Hypertension Mother   . Breast cancer Mother        early 59's  . Heart failure Father   . Breast cancer Sister        unsure of age    Prior to Admission medications   Medication Sig Start Date End Date Taking? Authorizing Provider  atorvastatin (LIPITOR) 40 MG tablet TAKE 1 TABLET BY MOUTH AT BEDTIME 10/29/18  Yes Leonie Man, MD  carvedilol (COREG) 12.5 MG tablet Take 12.5 mg by mouth 2 (two) times daily. 04/01/19  Yes [provider]  Cholecalciferol (VITAMIN D3) 125 MCG (5000 UT) TABS Take 1 tablet by mouth daily.   Yes [provider]  clopidogrel (PLAVIX) 75 MG tablet TAKE 1 TABLET BY MOUTH DAILY 08/27/18  Yes Leonie Man, MD  [employer] IRON 325 (65 Fe) MG tablet Take 325 mg by mouth daily. 05/05/19  Yes [provider]  diclofenac sodium (VOLTAREN) 1 % GEL Apply 2 g topically daily as needed (pain).   Yes [provider]  promethazine (PHENERGAN) 25 MG suppository Place 1  suppository (25 mg total) rectally every 6 (six) hours as needed for nausea or vomiting. 05/11/19  Yes Kinnie Feil, PA-C  traZODone (DESYREL) 100 MG tablet Take by mouth at bedtime. Dose unknown    Yes [provider]  Adalimumab (HUMIRA PEN) 40 MG/0.4ML PNKT Inject 40 mg into the skin every 14 (fourteen) days.     [provider]  atorvastatin (LIPITOR) 20 MG tablet TAKE 1 TABLET BY MOUTH EVERYDAY AT BEDTIME Patient not taking: Reported on 05/11/2019 10/13/18   Leonie Man, MD  carvedilol (COREG) 6.25 MG tablet TAKE 1 TABLET BY MOUTH TWICE A DAY WITH A MEAL Patient not taking: Reported on 05/11/2019 09/26/18   Leonie Man, MD  lisinopril (PRINIVIL,ZESTRIL) 2.5 MG tablet TAKE 1 TABLET BY MOUTH EVERY DAY Patient not taking: Reported on 05/11/2019 08/01/18   Leonie Man, MD  nitroGLYCERIN (NITROSTAT) 0.4 MG SL tablet PLACE 1 TABLET UNDER THE TONGUE EVERY 5 MINUTES AS NEEDED FOR CHEST PAIN. 08/01/15   Leonie Man, MD  tiZANidine (ZANAFLEX) 2 MG tablet Take 2 mg by mouth as needed for muscle spasms.  [provider]   Physical Exam: Blood pressure 135/71, pulse 83, temperature 99 F (37.2 C), temperature source Oral, resp. rate (!) 22, height 5\' 1"  (1.549 m), weight 87.8 kg, SpO2 98 %. Vitals:   05/12/19 0638 05/12/19 1335  BP: 119/84 135/71  Pulse: 75 83  Resp: 18 (!) 22  Temp: 98.1 F (36.7 C) 99 F (37.2 C)  SpO2: 100% 98%     General: Well-built, not in any distress.  Eyes: PERRLA, no drainage  ENT: No oropharyngeal lesions  Neck: Supple, no lymphadenopathy  Cardiovascular: S1-S2 normal no murmurs  Respiratory: Bilateral equal no added sound  Abdomen: Soft nontender bowel sounds present obese and pendulous  Skin: Intact  Musculoskeletal: Normal  Psychiatric: Normal mood and affect.  Neurologic: Intact  Labs on Admission:  Basic Metabolic Panel: Recent Labs  Lab 05/11/19 0823 05/12/19 0403 05/12/19 1225  NA 137  139 141  K 3.5 3.5 3.4*  CL 100 107 107  CO2 27 24 26   GLUCOSE 174* 148* 157*  BUN 19 29* 34*  CREATININE 2.75* 2.99* 3.05*  CALCIUM 9.0 8.1* 8.0*   Liver Function Tests: Recent Labs  Lab 05/11/19 0823  AST 16  ALT 15  ALKPHOS 63  BILITOT 1.0  PROT 6.9  ALBUMIN 3.4*   Recent Labs  Lab 05/11/19 0823  LIPASE 32   No results for input(s): AMMONIA in the last 168 hours. CBC: Recent Labs  Lab 05/11/19 0823 05/12/19 0403  WBC 16.1* 15.3*  NEUTROABS 15.1*  --   HGB 10.0* 8.8*  HCT 30.9* 28.3*  MCV 96.9 102.2*  PLT 253 169   Cardiac Enzymes: No results for input(s): CKTOTAL, CKMB, CKMBINDEX, TROPONINI in the last 168 hours. BNP: Invalid input(s): POCBNP CBG: No results for input(s): GLUCAP in the last 168 hours.  Radiological Exams on Admission: Ct Abdomen Pelvis Wo Contrast  Result Date: 05/11/2019 CLINICAL DATA:  Right flank pain.  Renal failure. EXAM: CT ABDOMEN AND PELVIS WITHOUT CONTRAST TECHNIQUE: Multidetector CT imaging of the abdomen and pelvis was performed following the standard protocol without oral or IV contrast. COMPARISON:  None. FINDINGS: Lower chest: There is mild bibasilar atelectasis. There is no lung base edema or consolidation. There is a small amount of pericardial effusion which may be within the physiologic range. There is a small hiatal hernia. Hepatobiliary: No focal liver lesions are demonstrable on this noncontrast enhanced study. Gallbladder is upper normal in size without wall thickening or apparent pericholecystic fluid by CT. No biliary duct dilatation evident. Pancreas: No evident pancreatic mass or inflammatory focus. Spleen: No splenic lesions evident. Adrenals/Urinary Tract: Adrenals bilaterally appear normal. Right kidney is edematous with perinephric stranding on the right and mild perinephric fluid on the right. No evident renal mass on either side. There is mild hydronephrosis on the right. There is minimal hydronephrosis on the left.  There is no intrarenal calculus on either side. On the right, there is a calculus in the distal right ureter at the superior acetabular level measuring 4 x 3 mm. On the left, there is a 3 x 2 mm calculus in the distal left ureter, also at the upper acetabular level. No other ureteral calculi are evident on either side. Urinary bladder is midline with wall thickness within normal limits. Stomach/Bowel: There is moderate stool in the colon. There is no appreciable bowel wall or mesenteric thickening. No evident bowel obstruction. There is lipomatous infiltration of the ileocecal valve. Terminal ileum appears unremarkable. No free air or portal venous  air. Vascular/Lymphatic: There are scattered foci of aortic and iliac artery atherosclerotic calcification. No aneurysm evident. There is no adenopathy in the abdomen or pelvis. Reproductive: The uterus is midline and. There are apparent tubal ligation clips. No pelvic mass evident. Other: Appendix is not seen. No periappendiceal region inflammatory change evident. No abscess or ascites is evident in the abdomen or pelvis. Musculoskeletal: There is degenerative change in the lumbar spine. There are no blastic or lytic bone lesions. There is no intramuscular or abdominal wall lesion. IMPRESSION: 1. There is a 4 x 3 mm calculus in the distal right ureter at the superior acetabular level with fairly mild hydronephrosis on the right. The right kidney is edematous with perinephric stranding and fluid. 2. 3 x 2 mm calculus distal left ureter at the superior acetabular level with slight hydronephrosis on the left. Minimal perinephric stranding on the left. 3. No bowel obstruction. No abscess in the abdomen or pelvis. No periappendiceal region inflammatory region change. 4.  Foci of aortic and iliac artery atherosclerosis noted. 5. Small amount of pericardial fluid which may be within physiologic range. 6.  Small hiatal hernia. Electronically Signed   By: Lowella Grip III  M.D.   On: 05/11/2019 10:45   Dg C-arm 1-60 Min-no Report  Result Date: 05/11/2019 Fluoroscopy was utilized by the requesting physician.  No radiographic interpretation.    Time spent: 35 minutes  Barb Merino [facility] Pager [phone]  If 7PM-7AM, please contact night-coverage [email or web address] Password [facility] 05/12/2019, 3:23 PM

## 2019-05-12 NOTE — Progress Notes (Signed)
Urology Inpatient Progress Report  bilateral ureteral stones  Procedure(s): CYSTOSCOPY WITH RETROGRADE PYELOGRAM,  AND STENT PLACEMENT  1 Day Post-Op   Intv/Subj: No acute events overnight. Patient is without complaint. E. coli growing in urine.  Sensitivities pending.  Seems to be clinically improving on ceftriaxone. Has worsening renal insufficiency.  Dr. Sloan Leiter has been consulted and is assisting with the management of her.  Active Problems:   Ureteral calculus  Current Facility-Administered Medications  Medication Dose Route Frequency Provider Last Rate Last Dose  . 0.9 %  sodium chloride infusion   Intravenous Continuous Marton Redwood III, MD 125 mL/hr at 05/12/19 1515    . acetaminophen (TYLENOL) tablet 650 mg  650 mg Oral Q4H PRN Marton Redwood III, MD      . atorvastatin (LIPITOR) tablet 40 mg  40 mg Oral QHS Marton Redwood III, MD      . carvedilol (COREG) tablet 12.5 mg  12.5 mg Oral BID Marton Redwood III, MD   12.5 mg at 05/12/19 1038  . cefTRIAXone (ROCEPHIN) 1 g in sodium chloride 0.9 % 100 mL IVPB  1 g Intravenous Q24H Marton Redwood III, MD 200 mL/hr at 05/11/19 2227 1 g at 05/11/19 2227  . cholecalciferol (VITAMIN D3) tablet 5,000 Units  5,000 Units Oral Daily Marton Redwood III, MD   5,000 Units at 05/12/19 1036  . clopidogrel (PLAVIX) tablet 75 mg  75 mg Oral Daily Marton Redwood III, MD   75 mg at 05/12/19 1038  . diphenhydrAMINE (BENADRYL) injection 12.5 mg  12.5 mg Intravenous Q6H PRN Marton Redwood III, MD       Or  . diphenhydrAMINE (BENADRYL) 12.5 MG/5ML elixir 12.5 mg  12.5 mg Oral Q6H PRN Marton Redwood III, MD      . docusate sodium (COLACE) capsule 100 mg  100 mg Oral BID Marton Redwood III, MD   100 mg at 05/12/19 1038  . ferrous sulfate tablet 325 mg  325 mg Oral Daily Marton Redwood III, MD   325 mg at 05/12/19 1038  . heparin injection 5,000 Units  5,000 Units Subcutaneous Q8H Marton Redwood III, MD   5,000 Units at 05/12/19 1353  . morphine 2  MG/ML injection 2-4 mg  2-4 mg Intravenous Q2H PRN Marton Redwood III, MD      . ondansetron (ZOFRAN) injection 4 mg  4 mg Intravenous Q4H PRN Marton Redwood III, MD      . opium-belladonna (B&O SUPPRETTES) 16.2-60 MG suppository 1 suppository  1 suppository Rectal Q6H PRN Marton Redwood III, MD      . oxybutynin (DITROPAN) tablet 5 mg  5 mg Oral Q8H PRN Marton Redwood III, MD      . oxyCODONE (Oxy IR/ROXICODONE) immediate release tablet 5 mg  5 mg Oral Q4H PRN Marton Redwood III, MD      . senna (SENOKOT) tablet 8.6 mg  1 tablet Oral BID Marton Redwood III, MD   8.6 mg at 05/12/19 1037  . traZODone (DESYREL) tablet 100 mg  100 mg Oral QHS Marton Redwood III, MD   100 mg at 05/11/19 2230  . zolpidem (AMBIEN) tablet 5 mg  5 mg Oral QHS PRN Lucas Mallow, MD         Objective: Vital: Vitals:   05/11/19 2200 05/12/19 0226 05/12/19 0638 05/12/19 1335  BP:  (!) 101/46 119/84 135/71  Pulse:  71 75  83  Resp:  18 18 (!) 22  Temp:  98.5 F (36.9 C) 98.1 F (36.7 C) 99 F (37.2 C)  TempSrc:  Oral Oral Oral  SpO2:  98% 100% 98%  Weight: 87.8 kg     Height: 5\' 1"  (1.549 m)      I/Os: I/O last 3 completed shifts: In: 2190.9 [I.V.:1990.9; IV Piggyback:200] Out: 781 [Urine:780; Blood:1]  Physical Exam:  General: Patient is in no apparent distress Lungs: Normal respiratory effort, chest expands symmetrically. GI:  The abdomen is soft and nontender without mass. Ext: lower extremities symmetric  Lab Results: Recent Labs    05/11/19 0823 05/12/19 0403  WBC 16.1* 15.3*  HGB 10.0* 8.8*  HCT 30.9* 28.3*   Recent Labs    05/11/19 0823 05/12/19 0403 05/12/19 1225  NA 137 139 141  K 3.5 3.5 3.4*  CL 100 107 107  CO2 27 24 26   GLUCOSE 174* 148* 157*  BUN 19 29* 34*  CREATININE 2.75* 2.99* 3.05*  CALCIUM 9.0 8.1* 8.0*   No results for input(s): LABPT, INR in the last 72 hours. No results for input(s): LABURIN in the last 72 hours. Results for orders placed or performed  during the hospital encounter of 05/11/19  Culture, blood (routine x 2)     Status: None (Preliminary result)   Collection Time: 05/11/19  8:10 PM   Specimen: BLOOD  Result Value Ref Range Status   Specimen Description   Final    BLOOD LEFT ANTECUBITAL Performed at [facility] [address], [place], [place] [address]    Special Requests   Final    BOTTLES DRAWN AEROBIC AND ANAEROBIC Blood Culture adequate volume Performed at [facility] [address], [place], [place] [address]    Culture   Final    NO GROWTH < 12 HOURS Performed at [facility] [address] [place], [place] [address]    Report Status PENDING  Incomplete  Culture, blood (routine x 2)     Status: None (Preliminary result)   Collection Time: 05/11/19  8:14 PM   Specimen: BLOOD  Result Value Ref Range Status   Specimen Description   Final    BLOOD BLOOD LEFT HAND Performed at [facility] [address] [place], [place] [address]    Special Requests   Final    BOTTLES DRAWN AEROBIC ONLY Blood Culture adequate volume Performed at [facility] [address] [place], [place] [address]    Culture   Final    NO GROWTH < 12 HOURS Performed at [facility] [address], [place],  [address]    Report Status PENDING  Incomplete    Studies/Results: Ct Abdomen Pelvis Wo Contrast  Result Date: 05/11/2019 CLINICAL DATA:  Right flank pain.  Renal failure. EXAM: CT ABDOMEN AND PELVIS WITHOUT CONTRAST TECHNIQUE: Multidetector CT imaging of the abdomen and pelvis was performed following the standard protocol without oral or IV contrast. COMPARISON:  None. FINDINGS: Lower chest: There is mild bibasilar atelectasis. There is no lung base edema or consolidation. There is a small amount of pericardial effusion which may be within the physiologic range. There is a small hiatal hernia. Hepatobiliary: No focal liver lesions  are demonstrable on this noncontrast enhanced study. Gallbladder is upper normal in size without wall thickening or apparent pericholecystic fluid by CT. No biliary duct dilatation evident. Pancreas: No evident pancreatic mass or inflammatory focus. Spleen: No splenic  lesions evident. Adrenals/Urinary Tract: Adrenals bilaterally appear normal. Right kidney is edematous with perinephric stranding on the right and mild perinephric fluid on the right. No evident renal mass on either side. There is mild hydronephrosis on the right. There is minimal hydronephrosis on the left. There is no intrarenal calculus on either side. On the right, there is a calculus in the distal right ureter at the superior acetabular level measuring 4 x 3 mm. On the left, there is a 3 x 2 mm calculus in the distal left ureter, also at the upper acetabular level. No other ureteral calculi are evident on either side. Urinary bladder is midline with wall thickness within normal limits. Stomach/Bowel: There is moderate stool in the colon. There is no appreciable bowel wall or mesenteric thickening. No evident bowel obstruction. There is lipomatous infiltration of the ileocecal valve. Terminal ileum appears unremarkable. No free air or portal venous air. Vascular/Lymphatic: There are scattered foci of aortic and iliac artery atherosclerotic calcification. No aneurysm evident. There is no adenopathy in the abdomen or pelvis. Reproductive: The uterus is midline and. There are apparent tubal ligation clips. No pelvic mass evident. Other: Appendix is not seen. No periappendiceal region inflammatory change evident. No abscess or ascites is evident in the abdomen or pelvis. Musculoskeletal: There is degenerative change in the lumbar spine. There are no blastic or lytic bone lesions. There is no intramuscular or abdominal wall lesion. IMPRESSION: 1. There is a 4 x 3 mm calculus in the distal right ureter at the superior acetabular level with fairly mild  hydronephrosis on the right. The right kidney is edematous with perinephric stranding and fluid. 2. 3 x 2 mm calculus distal left ureter at the superior acetabular level with slight hydronephrosis on the left. Minimal perinephric stranding on the left. 3. No bowel obstruction. No abscess in the abdomen or pelvis. No periappendiceal region inflammatory region change. 4.  Foci of aortic and iliac artery atherosclerosis noted. 5. Small amount of pericardial fluid which may be within physiologic range. 6.  Small hiatal hernia. Electronically Signed   By: Lowella Grip III M.D.   On: 05/11/2019 10:45   Dg C-arm 1-60 Min-no Report  Result Date: 05/11/2019 Fluoroscopy was utilized by the requesting physician.  No radiographic interpretation.    Assessment: Bilateral ureteral calculi Bilateral ureteral obstruction secondary to calculi Urinary tract infection secondary to E. Coli Acute renal insufficiency  Procedure(s): CYSTOSCOPY WITH RETROGRADE PYELOGRAM,  AND STENT PLACEMENT, 1 Day Post-Op  doing well.  Plan: Appreciate the assistance of Dr. Sloan Leiter with this patient in regards to her renal insufficiency.  Continue Rocephin for now.  I will change once sensitivities return.   Link Snuffer, MD Urology 05/12/2019, 5:25 PM

## 2019-05-13 DIAGNOSIS — I9589 Other hypotension: Secondary | ICD-10-CM | POA: Diagnosis not present

## 2019-05-13 DIAGNOSIS — N136 Pyonephrosis: Secondary | ICD-10-CM | POA: Diagnosis present

## 2019-05-13 DIAGNOSIS — I471 Supraventricular tachycardia: Secondary | ICD-10-CM | POA: Diagnosis not present

## 2019-05-13 DIAGNOSIS — Z79899 Other long term (current) drug therapy: Secondary | ICD-10-CM | POA: Diagnosis not present

## 2019-05-13 DIAGNOSIS — Z20828 Contact with and (suspected) exposure to other viral communicable diseases: Secondary | ICD-10-CM | POA: Diagnosis present

## 2019-05-13 DIAGNOSIS — N201 Calculus of ureter: Secondary | ICD-10-CM | POA: Diagnosis not present

## 2019-05-13 DIAGNOSIS — I129 Hypertensive chronic kidney disease with stage 1 through stage 4 chronic kidney disease, or unspecified chronic kidney disease: Secondary | ICD-10-CM | POA: Diagnosis present

## 2019-05-13 DIAGNOSIS — M1990 Unspecified osteoarthritis, unspecified site: Secondary | ICD-10-CM | POA: Diagnosis present

## 2019-05-13 DIAGNOSIS — E7800 Pure hypercholesterolemia, unspecified: Secondary | ICD-10-CM | POA: Diagnosis present

## 2019-05-13 DIAGNOSIS — I252 Old myocardial infarction: Secondary | ICD-10-CM | POA: Diagnosis not present

## 2019-05-13 DIAGNOSIS — B9620 Unspecified Escherichia coli [E. coli] as the cause of diseases classified elsewhere: Secondary | ICD-10-CM | POA: Diagnosis present

## 2019-05-13 DIAGNOSIS — E669 Obesity, unspecified: Secondary | ICD-10-CM | POA: Diagnosis present

## 2019-05-13 DIAGNOSIS — M069 Rheumatoid arthritis, unspecified: Secondary | ICD-10-CM | POA: Diagnosis present

## 2019-05-13 DIAGNOSIS — N179 Acute kidney failure, unspecified: Secondary | ICD-10-CM | POA: Diagnosis present

## 2019-05-13 DIAGNOSIS — Z803 Family history of malignant neoplasm of breast: Secondary | ICD-10-CM | POA: Diagnosis not present

## 2019-05-13 DIAGNOSIS — E785 Hyperlipidemia, unspecified: Secondary | ICD-10-CM | POA: Diagnosis present

## 2019-05-13 DIAGNOSIS — K449 Diaphragmatic hernia without obstruction or gangrene: Secondary | ICD-10-CM | POA: Diagnosis present

## 2019-05-13 DIAGNOSIS — Z8249 Family history of ischemic heart disease and other diseases of the circulatory system: Secondary | ICD-10-CM | POA: Diagnosis not present

## 2019-05-13 DIAGNOSIS — Z23 Encounter for immunization: Secondary | ICD-10-CM | POA: Diagnosis present

## 2019-05-13 DIAGNOSIS — K5903 Drug induced constipation: Secondary | ICD-10-CM | POA: Diagnosis present

## 2019-05-13 DIAGNOSIS — I2510 Atherosclerotic heart disease of native coronary artery without angina pectoris: Secondary | ICD-10-CM | POA: Diagnosis present

## 2019-05-13 DIAGNOSIS — Z7902 Long term (current) use of antithrombotics/antiplatelets: Secondary | ICD-10-CM | POA: Diagnosis not present

## 2019-05-13 DIAGNOSIS — Z6832 Body mass index (BMI) 32.0-32.9, adult: Secondary | ICD-10-CM | POA: Diagnosis not present

## 2019-05-13 DIAGNOSIS — N183 Chronic kidney disease, stage 3 (moderate): Secondary | ICD-10-CM | POA: Diagnosis present

## 2019-05-13 DIAGNOSIS — I9788 Other intraoperative complications of the circulatory system, not elsewhere classified: Secondary | ICD-10-CM | POA: Diagnosis not present

## 2019-05-13 LAB — CBC WITH DIFFERENTIAL/PLATELET
Abs Immature Granulocytes: 0.25 10*3/uL — ABNORMAL HIGH (ref 0.00–0.07)
Basophils Absolute: 0 10*3/uL (ref 0.0–0.1)
Basophils Relative: 0 %
Eosinophils Absolute: 0 10*3/uL (ref 0.0–0.5)
Eosinophils Relative: 0 %
HCT: 27.8 % — ABNORMAL LOW (ref 36.0–46.0)
Hemoglobin: 8.4 g/dL — ABNORMAL LOW (ref 12.0–15.0)
Immature Granulocytes: 2 %
Lymphocytes Relative: 9 %
Lymphs Abs: 1.6 10*3/uL (ref 0.7–4.0)
MCH: 31.5 pg (ref 26.0–34.0)
MCHC: 30.2 g/dL (ref 30.0–36.0)
MCV: 104.1 fL — ABNORMAL HIGH (ref 80.0–100.0)
Monocytes Absolute: 0.9 10*3/uL (ref 0.1–1.0)
Monocytes Relative: 6 %
Neutro Abs: 14.4 10*3/uL — ABNORMAL HIGH (ref 1.7–7.7)
Neutrophils Relative %: 83 %
Platelets: 210 10*3/uL (ref 150–400)
RBC: 2.67 MIL/uL — ABNORMAL LOW (ref 3.87–5.11)
RDW: 12.5 % (ref 11.5–15.5)
WBC: 17.2 10*3/uL — ABNORMAL HIGH (ref 4.0–10.5)
nRBC: 0 % (ref 0.0–0.2)

## 2019-05-13 LAB — BASIC METABOLIC PANEL
Anion gap: 8 (ref 5–15)
BUN: 35 mg/dL — ABNORMAL HIGH (ref 8–23)
CO2: 23 mmol/L (ref 22–32)
Calcium: 7.8 mg/dL — ABNORMAL LOW (ref 8.9–10.3)
Chloride: 110 mmol/L (ref 98–111)
Creatinine, Ser: 2.93 mg/dL — ABNORMAL HIGH (ref 0.44–1.00)
GFR calc Af Amer: 18 mL/min — ABNORMAL LOW (ref >60)
GFR calc non Af Amer: 16 mL/min — ABNORMAL LOW (ref >60)
Glucose, Bld: 120 mg/dL — ABNORMAL HIGH (ref 70–99)
Potassium: 3.4 mmol/L — ABNORMAL LOW (ref 3.5–5.1)
Sodium: 141 mmol/L (ref 135–145)

## 2019-05-13 LAB — URINE CULTURE
Culture: 80000 — AB
Culture: >=100000 — AB

## 2019-05-13 MED ORDER — AMOXICILLIN-POT CLAVULANATE 500-125 MG PO TABS
1.0000 | ORAL_TABLET | Freq: Two times a day (BID) | ORAL | Status: DC
  Administered 2019-05-13 – 2019-05-14 (×3): 500 mg via ORAL
  Filled 2019-05-13 (×4): qty 1

## 2019-05-13 MED ORDER — LACTATED RINGERS IV SOLN
INTRAVENOUS | Status: DC
  Administered 2019-05-13: 19:00:00 via INTRAVENOUS

## 2019-05-13 NOTE — Progress Notes (Signed)
Urology Inpatient Progress Report  bilateral ureteral stones  Procedure(s): CYSTOSCOPY WITH RETROGRADE PYELOGRAM,  AND STENT PLACEMENT  2 Days Post-Op   Intv/Subj: No acute events overnight. Patient is without complaint. Urine culture growing pansensitive E. coli.  Blood culture negative x2 days.  She denies any pain.  Creatinine has stabilized  Active Problems:   Ureteral calculus  Current Facility-Administered Medications  Medication Dose Route Frequency Provider Last Rate Last Dose  . 0.9 %  sodium chloride infusion   Intravenous Continuous Marton Redwood III, MD 125 mL/hr at 05/13/19 [phone]    . acetaminophen (TYLENOL) tablet 650 mg  650 mg Oral Q4H PRN Marton Redwood III, MD      . amoxicillin-clavulanate (AUGMENTIN) 500-125 MG per tablet 500 mg  1 tablet Oral BID Marton Redwood III, MD   500 mg at 05/13/19 1145  . atorvastatin (LIPITOR) tablet 40 mg  40 mg Oral QHS Marton Redwood III, MD   40 mg at 05/12/19 2142  . carvedilol (COREG) tablet 12.5 mg  12.5 mg Oral BID Marton Redwood III, MD   12.5 mg at 05/13/19 0856  . cholecalciferol (VITAMIN D3) tablet 5,000 Units  5,000 Units Oral Daily Marton Redwood III, MD   5,000 Units at 05/13/19 [phone]  . clopidogrel (PLAVIX) tablet 75 mg  75 mg Oral Daily Marton Redwood III, MD   75 mg at 05/13/19 0857  . diphenhydrAMINE (BENADRYL) injection 12.5 mg  12.5 mg Intravenous Q6H PRN Marton Redwood III, MD       Or  . diphenhydrAMINE (BENADRYL) 12.5 MG/5ML elixir 12.5 mg  12.5 mg Oral Q6H PRN Marton Redwood III, MD      . docusate sodium (COLACE) capsule 100 mg  100 mg Oral BID Marton Redwood III, MD   100 mg at 05/13/19 0856  . ferrous sulfate tablet 325 mg  325 mg Oral Daily Marton Redwood III, MD   325 mg at 05/13/19 0857  . heparin injection 5,000 Units  5,000 Units Subcutaneous Q8H Marton Redwood III, MD   5,000 Units at 05/13/19 [phone]  . morphine 2 MG/ML injection 2-4 mg  2-4 mg Intravenous Q2H PRN Marton Redwood III, MD      . ondansetron  (ZOFRAN) injection 4 mg  4 mg Intravenous Q4H PRN Marton Redwood III, MD      . opium-belladonna (B&O SUPPRETTES) 16.2-60 MG suppository 1 suppository  1 suppository Rectal Q6H PRN Marton Redwood III, MD      . oxybutynin (DITROPAN) tablet 5 mg  5 mg Oral Q8H PRN Marton Redwood III, MD      . oxyCODONE (Oxy IR/ROXICODONE) immediate release tablet 5 mg  5 mg Oral Q4H PRN Marton Redwood III, MD      . senna (SENOKOT) tablet 8.6 mg  1 tablet Oral BID Marton Redwood III, MD   8.6 mg at 05/13/19 0857  . traZODone (DESYREL) tablet 100 mg  100 mg Oral QHS Marton Redwood III, MD   100 mg at 05/11/19 2230  . zolpidem (AMBIEN) tablet 5 mg  5 mg Oral QHS PRN Marton Redwood III, MD   5 mg at 05/12/19 2142     Objective: Vital: Vitals:   05/12/19 1814 05/12/19 2000 05/13/19 0027 05/13/19 0353  BP: 129/70 134/72 130/69 137/84  Pulse: 76 81 73 74  Resp: (!) 21 18 18 16   Temp: 98.9 F (37.2 C) 99.4  F (37.4 C) 98.4 F (36.9 C) 97.9 F (36.6 C)  TempSrc: Oral Oral Oral Oral  SpO2: 98% 97% 98% 97%  Weight:      Height:       I/Os: I/O last 3 completed shifts: In: 4934.5 [P.O.:598; I.V.:3136.5; IV Piggyback:1200] Out: 2306 [Urine:2305; Blood:1]  Physical Exam:  General: Patient is in no apparent distress Lungs: Normal respiratory effort, chest expands symmetrically. GI:The abdomen is soft and nontender without mass. Ext: lower extremities symmetric  Lab Results: Recent Labs    05/11/19 0823 05/12/19 0403 05/13/19 0418  WBC 16.1* 15.3* 17.2*  HGB 10.0* 8.8* 8.4*  HCT 30.9* 28.3* 27.8*   Recent Labs    05/12/19 0403 05/12/19 1225 05/13/19 0418  NA 139 141 141  K 3.5 3.4* 3.4*  CL 107 107 110  CO2 24 26 23   GLUCOSE 148* 157* 120*  BUN 29* 34* 35*  CREATININE 2.99* 3.05* 2.93*  CALCIUM 8.1* 8.0* 7.8*   No results for input(s): LABPT, INR in the last 72 hours. No results for input(s): LABURIN in the last 72 hours. Results for orders placed or performed during the hospital  encounter of 05/11/19  Urine Culture     Status: Abnormal   Collection Time: 05/11/19  7:26 PM   Specimen: Urine, Cystoscope  Result Value Ref Range Status   Specimen Description   Final    URINE, RANDOM Performed at [facility], [place] [address], [place], [place] [address]    Special Requests   Final    NONE Performed at [facility], [place] [address] [place], [place] [address]    Culture >=100,000 COLONIES/mL ESCHERICHIA COLI (A)  Final   Report Status 05/13/2019 FINAL  Final   Organism ID, Bacteria ESCHERICHIA COLI (A)  Final      Susceptibility   Escherichia coli - MIC*    AMPICILLIN <=2 SENSITIVE Sensitive     CEFAZOLIN <=4 SENSITIVE Sensitive     CEFTRIAXONE <=1 SENSITIVE Sensitive     CIPROFLOXACIN <=0.25 SENSITIVE Sensitive     GENTAMICIN <=1 SENSITIVE Sensitive     IMIPENEM <=0.25 SENSITIVE Sensitive     NITROFURANTOIN <=16 SENSITIVE Sensitive     TRIMETH/SULFA <=20 SENSITIVE Sensitive     AMPICILLIN/SULBACTAM <=2 SENSITIVE Sensitive     PIP/TAZO <=4 SENSITIVE Sensitive     Extended ESBL NEGATIVE Sensitive     * >=100,000 COLONIES/mL ESCHERICHIA COLI  Culture, blood (routine x 2)     Status: None (Preliminary result)   Collection Time: 05/11/19  8:10 PM   Specimen: BLOOD  Result Value Ref Range Status   Specimen Description   Final    BLOOD LEFT ANTECUBITAL Performed at [facility] [address], [place], [place] [address]    Special Requests   Final    BOTTLES DRAWN AEROBIC AND ANAEROBIC Blood Culture adequate volume Performed at [facility] [address] [place], [place] [address]    Culture   Final    NO GROWTH 2 DAYS Performed at [facility] [address], [place],  [address]    Report Status PENDING  Incomplete  Culture, blood (routine x 2)     Status: None (Preliminary result)   Collection Time: 05/11/19  8:14 PM   Specimen: BLOOD  Result Value Ref Range  Status   Specimen Description   Final    BLOOD BLOOD LEFT HAND Performed at [facility] [address] [place],  [place] [address]    Special Requests   Final    BOTTLES DRAWN AEROBIC ONLY Blood Culture adequate volume Performed at [facility] [address], [place], [place] [address]    Culture   Final    NO GROWTH 2 DAYS Performed at [facility] [address], [place],  [address]    Report Status PENDING  Incomplete    Studies/Results: Dg C-arm 1-60 Min-no Report  Result Date: 05/11/2019 Fluoroscopy was utilized by the requesting physician.  No radiographic interpretation.    Assessment: Bilateral ureteral calculi Acute on chronic renal insufficiency stage III Urinary tract infection  Procedure(s): CYSTOSCOPY WITH RETROGRADE PYELOGRAM,  AND STENT PLACEMENT, 2 Days Post-Op  doing well.  Plan: Stop ceftriaxone.  Start Augmentin.  I consulted with pharmacy and therefore started the dosage recommended.  Keep 1 more day for observation.  Likely home tomorrow.   Link Snuffer, MD Urology 05/13/2019, 12:58 PM

## 2019-05-13 NOTE — Progress Notes (Addendum)
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  In the emergency room she ws found with cute kidney injury, bnorml urinlysis, CT scn bdomen pelvis with bilterl ureteric stone with mild hydronephrosis.  She ws dmitted treted with IV ntibiotics, underwent bilterl ureteric stent plcement with good results.  E. coli is growing in the urine.  She hd few episodes of low blood pressure otherwise nothing significnt events postop.  Ptient ws given fluid boluses nd lbumin postop. Tody, she is cliniclly improving.  She hs no more nuse or vomiting.  She is eting her lunch.  Denies ny bdominl pin.  No urinry symptoms tody.  She hd temperture 101 overnight.  Ptient denies ny cough or shortness of breth.  Her repet lb test shows elevted cretinine of 3, hence medicl consulttion.  Assessment & Pln:   Active Problems:   Ureterl clculus   1. Acute kidney injury in  ptient with history of chronic kidney disese stge III from hypertension: 1. Likely relted to hypotension nd hydronephrosis, pt reportedly hypotensive in OR during procedure 2. UOP dequte 3. Continue IVF 4. Hold NSAIDs  5. Bseline cretinine ~2.24 6. Miniml improvement tody, will continue IVF for now  2.. Acute UTI present on dmission: Growing E. coli.  Nrrowed to ugmentin per urology.  WBC slightly up tody.   3.  Bilterl Hydronephrosis  Bilterl Ureterl Clculus  Bilterl ureteric stent: Sttus post ureteric stent plcement with good results.  As per surgery.  4.  Hypertension: Blood pressures re stble.  Continue Coreg.   DVT prophylxis: heprin  Code Sttus: full code  Fmily Communiction:none t bedside - discussed with dughter Disposition Pln: per primry  Urology is primry service   Procedures:  1.  Cystoscopy with bilterl retrogrde pyelogrm nd bilterl ureterl stent plcement  Antimicrobils: Anti-infectives (From dmission, onwrd)   Strt     Dose/Rte Route Frequency Ordered Stop   05/13/19 1130  moxicillin-clvulnte (AUGMENTIN) 500-125 MG per tblet 500 mg     1 tblet Orl 2 times dily 05/13/19 1028     05/11/19 2200  cefTRIAXone (ROCEPHIN) 1 g in sodium chloride 0.9 % 100 mL IVPB  Sttus:  Discontinued     1 g 200 mL/hr over 30 Minutes Intrvenous Every 24 hours 05/11/19 2156 05/13/19 1028   05/11/19 1745  ceFAZolin (ANCEF) 2-4 GM/100ML-% IVPB    Note to Phrmcy: Mrchi Meiers   : cbinet override      05/11/19 1745 05/11/19 1915   05/11/19 1741  ceFAZolin (ANCEF) IVPB 2g/100 mL premix     2 g 200 mL/hr over 30 Minutes Intrvenous 30 min pre-op 05/11/19 1741 05/11/19 1915     Subjective: Feels ok Wondering if she'll be ble to go home tmrw  Objective: Vitls:   05/12/19 2000 05/13/19 0027 05/13/19 0353 05/13/19 1357  BP: 134/72 130/69 137/84 (!) 143/62  Pulse: 81 73 74 79  Resp: 18 18 16 16   Temp: 99.4 F (37.4 C) 98.4 F (36.9 C) 97.9 F (36.6 C) 98.9 F (37.2 C)  TempSrc: Orl Orl Orl Orl  SpO2: 97% 98% 97% 95%  Weight:      Height:        Intke/Output Summry (Lst 24 hours)  at 05/13/2019 1621 Last data filed at 05/13/2019 1009 Gross per 24 hour  Intake 796.89 ml  Output 1400 ml  Net -603.11 ml   Filed Weights   05/11/19 1657 05/11/19 2200  Weight: 86.5 kg 87.8 kg    Examination:  General exam: ppears calm and comfortable  Respiratory system: Clear to auscultation. Respiratory effort normal. Cardiovascular system: S1 & S2 heard, RRR.  Gastrointestinal system: bdomen is nondistended, soft and nontender.  Central nervous system: lert and oriented. No focal neurological deficits.  Extremities: no lee Skin: No rashes, lesions or ulcers Psychiatry: Judgement and insight appear normal. Mood & affect appropriate.     Data Reviewed: I have personally reviewed following labs and imaging studies  CBC: Recent Labs  Lab 05/11/19 0823 05/12/19 0403 05/13/19 0418  WBC 16.1* 15.3* 17.2*  NEUTROBS 15.1*  --  14.4*  HGB 10.0* 8.8* 8.4*  HCT 30.9* 28.3* 27.8*  MCV 96.9 102.2* 104.1*  PLT 253 169 381   Basic Metabolic Panel: Recent Labs  Lab 05/11/19 0823 05/12/19 0403 05/12/19 1225 05/13/19 0418  N 137 139 141 141  K 3.5 3.5 3.4* 3.4*  CL 100 107 107 110  CO2 27 24 26 23   GLUCOSE 174* 148* 157* 120*  BUN 19 29* 34* 35*  CRETININE 2.75* 2.99* 3.05* 2.93*  CLCIUM 9.0 8.1* 8.0* 7.8*   GFR: Estimated Creatinine Clearance: 18.8 mL/min () (by C-G formula based on SCr of 2.93 mg/dL (H)). Liver Function Tests: Recent Labs  Lab 05/11/19 0823  ST 16  LT 15  LKPHOS 63  BILITOT 1.0  PROT 6.9  LBUMIN 3.4*   Recent Labs  Lab 05/11/19 0823  LIPSE 32   No results for input(s): MMONI in the last 168 hours. Coagulation Profile: No results for input(s): INR, PROTIME in the last 168 hours. Cardiac Enzymes: No results for input(s): CKTOTL, CKMB, CKMBINDEX, TROPONINI in the last 168 hours. BNP (last 3 results) No results for input(s): PROBNP in the last 8760 hours. Hb1C: No results for input(s): HGB1C in the last 72 hours. CBG: No results for input(s): GLUCP in the last 168 hours. Lipid Profile: No results for input(s): CHOL, HDL, LDLCLC, TRIG, CHOLHDL, LDLDIRECT in the last 72 hours. Thyroid Function Tests: No results for input(s): TSH, T4TOTL, FREET4, T3FREE, THYROIDB in the last 72 hours. nemia Panel: No results for input(s): VITMINB12, FOLTE, FERRITIN, TIBC, IRON, RETICCTPCT in the last 72 hours. Sepsis Labs: No results for input(s): PROCLCITON, LTICCIDVEN in the last 168 hours.  Recent Results (from the past 240 hour(s))   Urine culture     Status: bnormal   Collection Time: 05/11/19 10:40 M   Specimen: Urine, Clean Catch  Result Value Ref Range Status   Specimen Description URINE, CLEN CTCH  Final   Special Requests   Final    NONE Performed at [facility], [address] [place], [place] [address]    Culture 80,000 COLONIES/mL ESCHERICHI COLI ()  Final   Report Status 05/13/2019 FINL  Final   Organism ID, Bacteria ESCHERICHI COLI ()  Final      Susceptibility   Escherichia coli - MIC*    MPICILLIN <=2 SENSITIVE Sensitive     CEFZOLIN <=4 SENSITIVE Sensitive     CEFTRIXONE <=1 SENSITIVE Sensitive     CIPROFLOXCIN <=0.25 SENSITIVE Sensitive     GENTMICIN <=1 SENSITIVE Sensitive     IMIPENEM <=0.25 SENSITIVE Sensitive     NITROFURNTOIN <=16 SENSITIVE Sensitive     TRIMETH/SULF <=  20 SENSITIVE Sensitive     MPICILLIN/SULBCTM <=2 SENSITIVE Sensitive     PIP/TZO <=4 SENSITIVE Sensitive     Extended ESBL NEGTIVE Sensitive     * 80,000 COLONIES/mL ESCHERICHI COLI  SRS Coronavirus 2 (CEPHEID - Performed in [facility] lab), Hosp Order     Status: None   Collection Time: 05/11/19 11:35 M   Specimen: Nasopharyngeal Swab  Result Value Ref Range Status   SRS Coronavirus 2 NEGTIVE NEGTIVE Final    Comment: (NOTE) If result is NEGTIVE SRS-CoV-2 target nucleic acids are NOT DETECTED. The SRS-CoV-2 RN is generally detectable in upper and lower  respiratory specimens during the acute phase of infection. The lowest  concentration of SRS-CoV-2 viral copies this assay can detect is 250  copies / mL.  negative result does not preclude SRS-CoV-2 infection  and should not be used as the sole basis for treatment or other  patient management decisions.   negative result may occur with  improper specimen collection / handling, submission of specimen other  than nasopharyngeal swab, presence of viral mutation(s) within the  areas targeted by this assay, and inadequate  number of viral copies  (<250 copies / mL).  negative result must be combined with clinical  observations, patient history, and epidemiological information. If result is POSITIVE SRS-CoV-2 target nucleic acids are DETECTED. The SRS-CoV-2 RN is generally detectable in upper and lower  respiratory specimens dur ing the acute phase of infection.  Positive  results are indicative of active infection with SRS-CoV-2.  Clinical  correlation with patient history and other diagnostic information is  necessary to determine patient infection status.  Positive results do  not rule out bacterial infection or co-infection with other viruses. If result is PRESUMPTIVE POSTIVE SRS-CoV-2 nucleic acids MY BE PRESENT.    presumptive positive result was obtained on the submitted specimen  and confirmed on repeat testing.  While 2019 novel coronavirus  (SRS-CoV-2) nucleic acids may be present in the submitted sample  additional confirmatory testing may be necessary for epidemiological  and / or clinical management purposes  to differentiate between  SRS-CoV-2 and other Sarbecovirus currently known to infect humans.  If clinically indicated additional testing with an alternate test  methodology [phone]) is advised. The SRS-CoV-2 RN is generally  detectable in upper and lower respiratory sp ecimens during the acute  phase of infection. The expected result is Negative. Fact Sheet for Patients:  StrictlyIdeas.no Fact Sheet for Healthcare Providers: BankingDealers.co.za This test is not yet approved or cleared by the [place] [employer] and has been authorized for detection and/or diagnosis of SRS-CoV-2 by FD under an Emergency Use uthorization (EU).  This EU will remain in effect (meaning this test can be used) for the duration of the COVID-19 declaration under Section 564(b)(1) of the ct, 21 U.S.C. section 360bbb-3(b)(1), unless the  authorization is terminated or revoked sooner. Performed at [facility], [place] [address], [place], [place] [address]   Urine Culture     Status: bnormal   Collection Time: 05/11/19  7:26 PM   Specimen: Urine, Cystoscope  Result Value Ref Range Status   Specimen Description   Final    URINE, RNDOM Performed at [facility] [address] [place], [place] [address]    Special Requests   Final    NONE Performed at [facility], [place] [address], [place], [place] [address]    Culture >=100,000 COLONIES/mL ESCHERICHI COLI ()  Final   Report  Status 05/13/2019 FINL  Final   Organism ID, Bacteria ESCHERICHI COLI ()  Final      Susceptibility   Escherichia coli - MIC*    MPICILLIN <=2 SENSITIVE Sensitive     CEFZOLIN <=4 SENSITIVE Sensitive     CEFTRIXONE <=1 SENSITIVE Sensitive     CIPROFLOXCIN <=0.25 SENSITIVE Sensitive     GENTMICIN <=1 SENSITIVE Sensitive     IMIPENEM <=0.25 SENSITIVE Sensitive     NITROFURNTOIN <=16 SENSITIVE Sensitive     TRIMETH/SULF <=20 SENSITIVE Sensitive     MPICILLIN/SULBCTM <=2 SENSITIVE Sensitive     PIP/TZO <=4 SENSITIVE Sensitive     Extended ESBL NEGTIVE Sensitive     * >=100,000 COLONIES/mL ESCHERICHI COLI  Culture, blood (routine x 2)     Status: None (Preliminary result)   Collection Time: 05/11/19  8:10 PM   Specimen: BLOOD  Result Value Ref Range Status   Specimen Description   Final    BLOOD LEFT NTECUBITL Performed at [facility] [address] [place], [place] [address]    Special Requests   Final    BOTTLES DRWN EROBIC ND NEROBIC Blood Culture adequate volume Performed at [facility] [address] [place], [place] [address]    Culture   Final    NO GROWTH 2 DYS Performed at [facility] [address], [place], [place] [address]    Report Status PENDING  Incomplete  Culture, blood (routine x 2)     Status: None  (Preliminary result)   Collection Time: 05/11/19  8:14 PM   Specimen: BLOOD  Result Value Ref Range Status   Specimen Description   Final    BLOOD BLOOD LEFT HND Performed at [facility] [address], [place], [place] [address]    Special Requests   Final    BOTTLES DRWN EROBIC ONLY Blood Culture adequate volume Performed at [facility] [address] [place], [place] [address]    Culture   Final    NO GROWTH 2 DYS Performed at [facility] [address], [place], [place] [address]    Report Status PENDING  Incomplete         Radiology Studies: Dg C-arm 1-60 Min-no Report  Result Date: 05/11/2019 Fluoroscopy was utilized by the requesting physician.  No radiographic interpretation.        Scheduled Meds: . amoxicillin-clavulanate  1 tablet Oral BID  . atorvastatin  40 mg Oral QHS  . carvedilol  12.5 mg Oral BID  . cholecalciferol  5,000 Units Oral Daily  . clopidogrel  75 mg Oral Daily  . docusate sodium  100 mg Oral BID  . ferrous sulfate  325 mg Oral Daily  . heparin  5,000 Units Subcutaneous Q8H  . senna  1 tablet Oral BID  . traZODone  100 mg Oral QHS   Continuous Infusions: . sodium chloride 125 mL/hr at 05/13/19 0854     LOS: 1 day    Time spent: over 30 min    Fayrene Helper, MD [facility] Pager MION  If 7PM-7M, please contact night-coverage [email or web address] Password [email or web address] 05/13/2019, 4:21 PM

## 2019-05-14 ENCOUNTER — Telehealth: Payer: Self-pay

## 2019-05-14 LAB — CBC
HCT: 27.6 % — ABNORMAL LOW (ref 36.0–46.0)
Hemoglobin: 8.4 g/dL — ABNORMAL LOW (ref 12.0–15.0)
MCH: 31.3 pg (ref 26.0–34.0)
MCHC: 30.4 g/dL (ref 30.0–36.0)
MCV: 103 fL — ABNORMAL HIGH (ref 80.0–100.0)
Platelets: 174 10*3/uL (ref 150–400)
RBC: 2.68 MIL/uL — ABNORMAL LOW (ref 3.87–5.11)
RDW: 12.4 % (ref 11.5–15.5)
WBC: 12.4 10*3/uL — ABNORMAL HIGH (ref 4.0–10.5)
nRBC: 0 % (ref 0.0–0.2)

## 2019-05-14 LAB — BASIC METABOLIC PANEL
Anion gap: 8 (ref 5–15)
BUN: 27 mg/dL — ABNORMAL HIGH (ref 8–23)
CO2: 24 mmol/L (ref 22–32)
Calcium: 8.1 mg/dL — ABNORMAL LOW (ref 8.9–10.3)
Chloride: 111 mmol/L (ref 98–111)
Creatinine, Ser: 2.36 mg/dL — ABNORMAL HIGH (ref 0.44–1.00)
GFR calc Af Amer: 24 mL/min — ABNORMAL LOW (ref >60)
GFR calc non Af Amer: 21 mL/min — ABNORMAL LOW (ref >60)
Glucose, Bld: 97 mg/dL (ref 70–99)
Potassium: 3.4 mmol/L — ABNORMAL LOW (ref 3.5–5.1)
Sodium: 143 mmol/L (ref 135–145)

## 2019-05-14 MED ORDER — AMOXICILLIN-POT CLAVULANATE 500-125 MG PO TABS: 1.0000 | ORAL_TABLET | Freq: Two times a day (BID) | ORAL | 0 refills | Status: AC

## 2019-05-14 MED ORDER — OXYCODONE HCL 5 MG PO TABS: 5.0000 mg | ORAL_TABLET | ORAL | 0 refills | Status: DC | PRN

## 2019-05-14 NOTE — Telephone Encounter (Signed)
Per Pharm D, pt adm and appro tx for + UC 05/11/2019 ED

## 2019-05-14 NOTE — Progress Notes (Signed)
Consult NOTE    Samantha Mcbride  OVZ:[id number] DOB: 02/10/1951 DOA: 05/11/2019 PCP: Antony Contras, MD  Brief Narrative:  Patient with history of hypertension, rheumatoid arthritis on Humira, coronary artery disease, hyperlipidemia and history of STEMI who had multiple episodes of intermittent vomiting since last 2 days and came to the emergency room.  In the emergency room she was found with acute kidney injury, abnormal urinalysis, CT scan abdomen pelvis with bilateral ureteric stone with mild hydronephrosis.  She was admitted treated with IV antibiotics, underwent bilateral ureteric stent placement with good results.  E. coli is growing in the urine.  She had few episodes of low blood pressure otherwise nothing significant events postop.  Patient was given fluid boluses and albumin postop. Today, she is clinically improving.  She has no more nausea or vomiting.  She is eating her lunch.  Denies any abdominal pain.  No urinary symptoms today.  She had temperature 101 overnight.  Patient denies any cough or shortness of breath.  Her repeat lab test shows elevated creatinine of 3, hence medical consultation.  Assessment & Plan:   Active Problems:   Ureteral calculus  Pt to be d/c'd by urology today.  Would recommend outpatient f/u as planned with urology.  Also follow up as planned with nephrology.  She had short runs of SVT overnight, she's asx from this and is on beta blocker.  Rec follow up with outpatient cardiologist for this.  Currently on O2 and this will need to be weaned prior to d/c (discussed with urology and nurse).  1. Acute kidney injury in a patient with history of chronic kidney disease stage III from hypertension: 1. Likely related to hypotension and hydronephrosis, pt reportedly hypotensive in OR during procedure 2. UOP adequate 3. Continue IVF 4. Hold NSAIDs  5. Baseline creatinine ~2.24 6. Creatinine approaching baseline today.    2.. Acute UTI present on admission: Growing  E. coli.  Narrowed to augmentin per urology.  WBC improved today.  Abx per urology in setting of below.  3.  Bilateral Hydronephrosis  Bilateral Ureteral Calculus  Bilateral ureteric stent: Status post ureteric stent placement with good results.  As per surgery.  Pt notes plan for her to f/u on Monday with urology (per her report, note from urology pending, she understands need to f/u with urology).    4.  Hypertension: Blood pressures are stable.  Continue Coreg.  # SVT: pt with runs of SVT this AM.  Reviewed, appear to have P waves present, does not look like atrial fibrillation.  Discussed with cards who noted possible burst of ectopic atrial tachycardia.  She's on coreg and is asx from this standpoint.  Can f/u with cards outpatient as needed.  DVT prophylaxis: heparin  Code Status: full code Family Communication:none at bedside - discussed with daughter/sister on 7/29 Disposition Plan: per primary  Urology is primary service   Procedures:  1.  Cystoscopy with bilateral retrograde pyelogram and bilateral ureteral stent placement  Antimicrobials: Anti-infectives (From admission, onward)   Start     Dose/Rate Route Frequency Ordered Stop   05/14/19 0000  amoxicillin-clavulanate (AUGMENTIN) 500-125 MG tablet     1 tablet Oral 2 times daily 05/14/19 1252 05/24/19 2359   05/13/19 1130  amoxicillin-clavulanate (AUGMENTIN) 500-125 MG per tablet 500 mg     1 tablet Oral 2 times daily 05/13/19 1028     05/11/19 2200  cefTRIAXone (ROCEPHIN) 1 g in sodium chloride 0.9 % 100 mL IVPB  Status:  Discontinued     1 g 200 mL/hr over 30 Minutes Intravenous Every 24 hours 05/11/19 2156 05/13/19 1028   05/11/19 1745  ceFAZolin (ANCEF) 2-4 GM/100ML-% IVPB    Note to Pharmacy: Marchia Meiers   : cabinet override      05/11/19 1745 05/11/19 1915   05/11/19 1741  ceFAZolin (ANCEF) IVPB 2g/100 mL premix     2 g 200 mL/hr over 30 Minutes Intravenous 30 min pre-op 05/11/19 1741 05/11/19 1915      Subjective: Looking forward to going home.  Objective: Vitals:   05/13/19 0353 05/13/19 1357 05/13/19 2030 05/14/19 0456  BP: 137/84 (!) 143/62 (!) 150/78 (!) 154/80  Pulse: 74 79 82 75  Resp: 16 16 20 18   Temp: 97.9 F (36.6 C) 98.9 F (37.2 C) 99.1 F (37.3 C) 99.1 F (37.3 C)  TempSrc: Oral Oral Oral Oral  SpO2: 97% 95% 94% 97%  Weight:      Height:        Intake/Output Summary (Last 24 hours) at 05/14/2019 1357 Last data filed at 05/14/2019 1039 Gross per 24 hour  Intake 1480 ml  Output 2000 ml  Net -520 ml   Filed Weights   05/11/19 1657 05/11/19 2200  Weight: 86.5 kg 87.8 kg    Examination:  General: No acute distress. Cardiovascular: Heart sounds show a regular rate, and rhythm.  Lungs: Clear to auscultation bilaterally  Abdomen: Soft, nontender, nondistended Neurological: Alert and oriented 3. Moves all extremities 4 . Cranial nerves II through XII grossly intact. Skin: Warm and dry. No rashes or lesions. Extremities: No clubbing or cyanosis. No edema.   Data Reviewed: I have personally reviewed following labs and imaging studies  CBC: Recent Labs  Lab 05/11/19 0823 05/12/19 0403 05/13/19 0418 05/14/19 0436  WBC 16.1* 15.3* 17.2* 12.4*  NEUTROABS 15.1*  --  14.4*  --   HGB 10.0* 8.8* 8.4* 8.4*  HCT 30.9* 28.3* 27.8* 27.6*  MCV 96.9 102.2* 104.1* 103.0*  PLT 253 169 210 149   Basic Metabolic Panel: Recent Labs  Lab 05/11/19 0823 05/12/19 0403 05/12/19 1225 05/13/19 0418 05/14/19 0436  NA 137 139 141 141 143  K 3.5 3.5 3.4* 3.4* 3.4*  CL 100 107 107 110 111  CO2 27 24 26 23 24   GLUCOSE 174* 148* 157* 120* 97  BUN 19 29* 34* 35* 27*  CREATININE 2.75* 2.99* 3.05* 2.93* 2.36*  CALCIUM 9.0 8.1* 8.0* 7.8* 8.1*   GFR: Estimated Creatinine Clearance: 23.3 mL/min (A) (by C-G formula based on SCr of 2.36 mg/dL (H)). Liver Function Tests: Recent Labs  Lab 05/11/19 0823  AST 16  ALT 15  ALKPHOS 63  BILITOT 1.0  PROT 6.9  ALBUMIN 3.4*    Recent Labs  Lab 05/11/19 0823  LIPASE 32   No results for input(s): AMMONIA in the last 168 hours. Coagulation Profile: No results for input(s): INR, PROTIME in the last 168 hours. Cardiac Enzymes: No results for input(s): CKTOTAL, CKMB, CKMBINDEX, TROPONINI in the last 168 hours. BNP (last 3 results) No results for input(s): PROBNP in the last 8760 hours. HbA1C: No results for input(s): HGBA1C in the last 72 hours. CBG: No results for input(s): GLUCAP in the last 168 hours. Lipid Profile: No results for input(s): CHOL, HDL, LDLCALC, TRIG, CHOLHDL, LDLDIRECT in the last 72 hours. Thyroid Function Tests: No results for input(s): TSH, T4TOTAL, FREET4, T3FREE, THYROIDAB in the last 72 hours. Anemia Panel: No results for input(s): VITAMINB12, FOLATE, FERRITIN, TIBC, IRON,  RETICCTPCT in the last 72 hours. Sepsis Labs: No results for input(s): PROCALCITON, LATICACIDVEN in the last 168 hours.  Recent Results (from the past 240 hour(s))  Urine culture     Status: Abnormal   Collection Time: 05/11/19 10:40 AM   Specimen: Urine, Clean Catch  Result Value Ref Range Status   Specimen Description URINE, CLEAN CATCH  Final   Special Requests   Final    NONE Performed at [facility], [address], [place], [place] [address]    Culture 80,000 COLONIES/mL ESCHERICHIA COLI (A)  Final   Report Status 05/13/2019 FINAL  Final   Organism ID, Bacteria ESCHERICHIA COLI (A)  Final      Susceptibility   Escherichia coli - MIC*    AMPICILLIN <=2 SENSITIVE Sensitive     CEFAZOLIN <=4 SENSITIVE Sensitive     CEFTRIAXONE <=1 SENSITIVE Sensitive     CIPROFLOXACIN <=0.25 SENSITIVE Sensitive     GENTAMICIN <=1 SENSITIVE Sensitive     IMIPENEM <=0.25 SENSITIVE Sensitive     NITROFURANTOIN <=16 SENSITIVE Sensitive     TRIMETH/SULFA <=20 SENSITIVE Sensitive     AMPICILLIN/SULBACTAM <=2 SENSITIVE Sensitive     PIP/TAZO <=4 SENSITIVE Sensitive     Extended ESBL NEGATIVE Sensitive     *  80,000 COLONIES/mL ESCHERICHIA COLI  SARS Coronavirus 2 (CEPHEID - Performed in [facility] hospital lab), Hosp Order     Status: None   Collection Time: 05/11/19 11:35 AM   Specimen: Nasopharyngeal Swab  Result Value Ref Range Status   SARS Coronavirus 2 NEGATIVE NEGATIVE Final    Comment: (NOTE) If result is NEGATIVE SARS-CoV-2 target nucleic acids are NOT DETECTED. The SARS-CoV-2 RNA is generally detectable in upper and lower  respiratory specimens during the acute phase of infection. The lowest  concentration of SARS-CoV-2 viral copies this assay can detect is 250  copies / mL. A negative result does not preclude SARS-CoV-2 infection  and should not be used as the sole basis for treatment or other  patient management decisions.  A negative result may occur with  improper specimen collection / handling, submission of specimen other  than nasopharyngeal swab, presence of viral mutation(s) within the  areas targeted by this assay, and inadequate number of viral copies  (<250 copies / mL). A negative result must be combined with clinical  observations, patient history, and epidemiological information. If result is POSITIVE SARS-CoV-2 target nucleic acids are DETECTED. The SARS-CoV-2 RNA is generally detectable in upper and lower  respiratory specimens dur ing the acute phase of infection.  Positive  results are indicative of active infection with SARS-CoV-2.  Clinical  correlation with patient history and other diagnostic information is  necessary to determine patient infection status.  Positive results do  not rule out bacterial infection or co-infection with other viruses. If result is PRESUMPTIVE POSTIVE SARS-CoV-2 nucleic acids MAY BE PRESENT.   A presumptive positive result was obtained on the submitted specimen  and confirmed on repeat testing.  While 2019 novel coronavirus  (SARS-CoV-2) nucleic acids may be present in the submitted sample  additional confirmatory testing may  be necessary for epidemiological  and / or clinical management purposes  to differentiate between  SARS-CoV-2 and other Sarbecovirus currently known to infect humans.  If clinically indicated additional testing with an alternate test  methodology [phone]) is advised. The SARS-CoV-2 RNA is generally  detectable in upper and lower respiratory sp ecimens during the acute  phase of infection. The expected result  is Negative. Fact Sheet for Patients:  StrictlyIdeas.no Fact Sheet for Healthcare Providers: BankingDealers.co.za This test is not yet approved or cleared by the [place] FDA and has been authorized for detection and/or diagnosis of SARS-CoV-2 by FDA under an Emergency Use Authorization (EUA).  This EUA will remain in effect (meaning this test can be used) for the duration of the COVID-19 declaration under Section 564(b)(1) of the Act, 21 U.S.C. section 360bbb-3(b)(1), unless the authorization is terminated or revoked sooner. Performed at [facility], [place] [address] [place], [place] [address]   Urine Culture     Status: Abnormal   Collection Time: 05/11/19  7:26 PM   Specimen: Urine, Cystoscope  Result Value Ref Range Status   Specimen Description   Final    URINE, RANDOM Performed at [facility] [address] [place], [place] [address]    Special Requests   Final    NONE Performed at [facility], [place] [address], [place], [place] [address]    Culture >=100,000 COLONIES/mL ESCHERICHIA COLI (A)  Final   Report Status 05/13/2019 FINAL  Final   Organism ID, Bacteria ESCHERICHIA COLI (A)  Final      Susceptibility   Escherichia coli - MIC*    AMPICILLIN <=2 SENSITIVE Sensitive     CEFAZOLIN <=4 SENSITIVE Sensitive     CEFTRIAXONE <=1 SENSITIVE Sensitive     CIPROFLOXACIN <=0.25 SENSITIVE Sensitive     GENTAMICIN <=1 SENSITIVE Sensitive     IMIPENEM <=0.25 SENSITIVE  Sensitive     NITROFURANTOIN <=16 SENSITIVE Sensitive     TRIMETH/SULFA <=20 SENSITIVE Sensitive     AMPICILLIN/SULBACTAM <=2 SENSITIVE Sensitive     PIP/TAZO <=4 SENSITIVE Sensitive     Extended ESBL NEGATIVE Sensitive     * >=100,000 COLONIES/mL ESCHERICHIA COLI  Culture, blood (routine x 2)     Status: None (Preliminary result)   Collection Time: 05/11/19  8:10 PM   Specimen: BLOOD  Result Value Ref Range Status   Specimen Description   Final    BLOOD LEFT ANTECUBITAL Performed at [facility] [address], [place], [place] [address]    Special Requests   Final    BOTTLES DRAWN AEROBIC AND ANAEROBIC Blood Culture adequate volume Performed at [facility] [address], [place], [place] [address]    Culture   Final    NO GROWTH 3 DAYS Performed at [facility], [place] [address] [place], [place] [address]    Report Status PENDING  Incomplete  Culture, blood (routine x 2)     Status: None (Preliminary result)   Collection Time: 05/11/19  8:14 PM   Specimen: BLOOD  Result Value Ref Range Status   Specimen Description   Final    BLOOD BLOOD LEFT HAND Performed at [facility] [address], [place], [place] [address]    Special Requests   Final    BOTTLES DRAWN AEROBIC ONLY Blood Culture adequate volume Performed at [facility] [address] [place], [place] [address]    Culture   Final    NO GROWTH 3 DAYS Performed at [facility], [place] [address] [place],  [address]    Report Status PENDING  Incomplete         Radiology Studies: No results found.      Scheduled Meds: . amoxicillin-clavulanate  1 tablet Oral BID  . atorvastatin  40 mg Oral QHS  .  carvedilol  12.5 mg Oral BID  . cholecalciferol  5,000 Units Oral Daily  . clopidogrel  75 mg Oral Daily  . docusate sodium  100 mg Oral BID  . ferrous sulfate  325 mg Oral Daily  . heparin  5,000 Units  Subcutaneous Q8H  . senna  1 tablet Oral BID  . traZODone  100 mg Oral QHS   Continuous Infusions: . lactated ringers 100 mL/hr at 05/13/19 1858     LOS: 2 days    Time spent: over 30 min    Fayrene Helper, MD [facility] Pager AMION  If 7PM-7AM, please contact night-coverage [email or web address] Password [email or web address] 05/14/2019, 1:57 PM

## 2019-05-14 NOTE — Progress Notes (Signed)
Went over discharge papers with patient and family.  All questions answered.  VSS.  Paperwork and belongings locked up with security given to patient.

## 2019-05-16 LAB — CULTURE, BLOOD (ROUTINE X 2)
Culture: NO GROWTH
Culture: NO GROWTH
Special Requests: ADEQUATE
Special Requests: ADEQUATE

## 2019-05-16 NOTE — Discharge Summary (Signed)
Physician Discharge Summary  Patient ID: Samantha Mcbride MRN: [id number] DOB/AGE: [id number] y.o.  Admit date: 05/11/2019 Discharge date: 05/14/2019  Admission Diagnoses:  Discharge Diagnoses:  Active Problems:   Ureteral calculus   Discharged Condition: good  Hospital Course: 68 year old female admitted with bilateral ureteral calculi, UTI, acute renal insufficiency.  She underwent bilateral ureteral stent placement.  Following day, creatinine worsened.  It did not improve much the following day.  However, with conservative management, her creatinine started to improve towards normal.  She was also febrile prior to the procedure.  Blood cultures negative.  Urine culture grew pansensitive E. coli and she was transitioned to Augmentin.  She was discharged in stable condition  Consults: Internal medicine  Significant Diagnostic Studies: microbiology: urine culture: positive for E. coli, pansensitive  Treatments: surgery: As above, antibiotics  Discharge Exam: Blood pressure (!) 154/80, pulse 75, temperature 99.1 F (37.3 C), temperature source Oral, resp. rate 18, height 5\' 1"  (1.549 m), weight 87.8 kg, SpO2 97 %. General appearance: alert no acute distress Adequate perfusion of extremities Nonlabored respiration Abdomen soft nontender nondistended  Disposition:    Allergies as of 05/14/2019   No Known Allergies     Medication List    TAKE these medications   amoxicillin-clavulanate 500-125 MG tablet Commonly known as: AUGMENTIN Take 1 tablet (500 mg total) by mouth 2 (two) times a day for 10 days. Notes to patient: Next due tonight.   atorvastatin 20 MG tablet Commonly known as: LIPITOR TAKE 1 TABLET BY MOUTH EVERYDAY AT BEDTIME Notes to patient: Next due tonight   atorvastatin 40 MG tablet Commonly known as: LIPITOR TAKE 1 TABLET BY MOUTH AT BEDTIME   carvedilol 6.25 MG tablet Commonly known as: COREG TAKE 1 TABLET BY MOUTH TWICE A DAY WITH A MEAL Notes to  patient: Next due this evening   carvedilol 12.5 MG tablet Commonly known as: COREG Take 12.5 mg by mouth 2 (two) times daily.   clopidogrel 75 MG tablet Commonly known as: PLAVIX TAKE 1 TABLET BY MOUTH DAILY Notes to patient: Tomorrow 7/31   [employer] Iron 325 (65 FE) MG tablet Generic drug: ferrous sulfate Take 325 mg by mouth daily. Notes to patient: Tomorrow 7/31   diclofenac sodium 1 % Gel Commonly known as: VOLTAREN Apply 2 g topically daily as needed (pain).   Humira Pen 40 MG/0.4ML Pnkt Generic drug: Adalimumab Inject 40 mg into the skin every 14 (fourteen) days.   lisinopril 2.5 MG tablet Commonly known as: ZESTRIL TAKE 1 TABLET BY MOUTH EVERY DAY   nitroGLYCERIN 0.4 MG SL tablet Commonly known as: Nitrostat PLACE 1 TABLET UNDER THE TONGUE EVERY 5 MINUTES AS NEEDED FOR CHEST PAIN.   oxyCODONE 5 MG immediate release tablet Commonly known as: Oxy IR/ROXICODONE Take 1 tablet (5 mg total) by mouth every 4 (four) hours as needed for moderate pain.   promethazine 25 MG suppository Commonly known as: PHENERGAN Place 1 suppository (25 mg total) rectally every 6 (six) hours as needed for nausea or vomiting.   tiZANidine 2 MG tablet Commonly known as: ZANAFLEX Take 2 mg by mouth as needed for muscle spasms.   traZODone 100 MG tablet Commonly known as: DESYREL Take by mouth at bedtime. Dose unknown Notes to patient: tonight   Vitamin D3 125 MCG (5000 UT) Tabs Take 1 tablet by mouth daily. Notes to patient: Tomorrow 7/31        Signed: Marton Redwood, III 05/16/2019, 12:47 PM

## 2019-05-19 ENCOUNTER — Telehealth: Payer: Self-pay | Admitting: Cardiology

## 2019-05-19 ENCOUNTER — Other Ambulatory Visit: Payer: Self-pay | Admitting: Urology

## 2019-05-19 NOTE — Telephone Encounter (Signed)
New Message        [facility] Pre-operative Risk Assessment    Request for surgical clearance:  1. What type of surgery is being performed? Ureteroscopy   2. When is this surgery scheduled? 05/25/19  3. What type of clearance is required (medical clearance vs. Pharmacy clearance to hold med vs. Both)? Pharmacy   4. Are there any medications that need to be held prior to surgery and how long? Plavix 5 days   5. Practice name and name of physician performing surgery? [facility] Dr Gloriann Loan   6. What is your office phone number [phone]   7.   What is your office fax number [phone]  8.   Anesthesia type (None, local, MAC, general) ? General    Samantha Mcbride 05/19/2019, 1:46 PM  _________________________________________________________________   (provider comments below)

## 2019-05-19 NOTE — Telephone Encounter (Signed)
Dr Ellyn Hack, Ms. 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 - DES PCI-LAD.is now on Plavix alone.  She needs ureteroscopy - can she hold her plavix for 5 days if stable.?   Please respond to CV pre op pool thanks.

## 2019-05-21 ENCOUNTER — Other Ambulatory Visit (HOSPITAL_COMMUNITY)
Admission: RE | Admit: 2019-05-21 | Discharge: 2019-05-21 | Disposition: A | Discharge status: Home / Self Care | Payer: Medicare Other | Source: Ambulatory Visit | Attending: Urology | Admitting: Urology

## 2019-05-21 DIAGNOSIS — Z20828 Contact with and (suspected) exposure to other viral communicable diseases: Secondary | ICD-10-CM | POA: Insufficient documentation

## 2019-05-21 DIAGNOSIS — Z01812 Encounter for preprocedural laboratory examination: Secondary | ICD-10-CM | POA: Insufficient documentation

## 2019-05-21 LAB — SARS CORONAVIRUS 2 (TAT 6-24 HRS): SARS Coronavirus 2: NEGATIVE

## 2019-05-21 NOTE — Telephone Encounter (Signed)
   Primary Cardiologist: Glenetta Hew, MD  Chart reviewed as part of pre-operative protocol coverage. Per Dr. Allison Quarry prior recommendations in 2019, and given that there has been no recent coronary artery stents or interval change in cardiac history, patient can hold plavix x5 days prior to upcoming urologic procedure. She should restart plavix when cleared to do so by her urologist.    Patient was notified of these recommendations and reports her last dose of plavix was 05/20/2019.   I will route this recommendation to the requesting party via Epic fax function and remove from pre-op pool.  Please call with questions.  Abigail Butts, PA-C 05/21/2019, 2:02 PM

## 2019-05-21 NOTE — Telephone Encounter (Signed)
LM with surgery scheduler with Dr. Purvis Sheffield office and asked to call our office if any questions.

## 2019-05-21 NOTE — Patient Instructions (Addendum)
YOU HAVE COMPLETED YOUR COVID TEST.   PLEASE BEGIN THE QUARANTINE INSTRUCTIONS AS OUTLINED IN YOUR HANDOUT.                Samantha Mcbride     Your procedure is scheduled on: 05-25-2019   Report to [facility]  Entrance    Report to admitting at 7:00 AM   1 VISITOR IS ALLOWED TO WAIT IN WAITING ROOM  ONLY DAY OF YOUR SURGERY.    Call this number if you have problems the morning of surgery [phone]    Remember: Do not eat food or drink liquids :After Midnight. BRUSH YOUR TEETH MORNING OF SURGERY AND RINSE YOUR MOUTH OUT, NO CHEWING GUM CANDY OR MINTS.     Take these medicines the morning of surgery with A SIP OF WATER: Carvedilol, Oxycodone if needed                                You may not have any metal on your body including hair pins and              piercings  Do not wear jewelry, make-up, lotions, powders or perfumes, deodorant             Do not wear nail polish.  Do not shave  48 hours prior to surgery.            Do not bring valuables to the hospital. [place].  Contacts, dentures or bridgework may not be worn into surgery.  Leave suitcase in the car. After surgery it may be brought to your room.     Patients discharged the day of surgery will not be allowed to drive home. IF YOU ARE HAVING SURGERY AND GOING HOME THE SAME DAY, YOU MUST HAVE AN ADULT TO DRIVE YOU HOME AND BE WITH YOU FOR 24 HOURS. YOU MAY GO HOME BY TAXI OR UBER OR ORTHERWISE, BUT AN ADULT MUST ACCOMPANY YOU HOME AND STAY WITH YOU FOR 24 HOURS.  Name and phone number of your driver:  Special Instructions: N/A              Please read over the following fact sheets you were given: _____________________________________________________________________             [facility] - Preparing for Surgery Before surgery, you can play an important role.  Because skin is not sterile, your skin needs to be as free of germs as possible.  You can  reduce the number of germs on your skin by washing with CHG (chlorahexidine gluconate) soap before surgery.  CHG is an antiseptic cleaner which kills germs and bonds with the skin to continue killing germs even after washing. Please DO NOT use if you have an allergy to CHG or antibacterial soaps.  If your skin becomes reddened/irritated stop using the CHG and inform your nurse when you arrive at Short Stay. Do not shave (including legs and underarms) for at least 48 hours prior to the first CHG shower.  You may shave your face/neck. Please follow these instructions carefully:  1.  Shower with CHG Soap the night before surgery and the  morning of Surgery.  2.  If you choose to wash your hair, wash your hair first as usual with your  normal  shampoo.  3.  After you shampoo, rinse your hair and body thoroughly to remove the  shampoo.                           4.  Use CHG as you would any other liquid soap.  You can apply chg directly  to the skin and wash                       Gently with a scrungie or clean washcloth.  5.  Apply the CHG Soap to your body ONLY FROM THE NECK DOWN.   Do not use on face/ open                           Wound or open sores. Avoid contact with eyes, ears mouth and genitals (private parts).                       Wash face,  Genitals (private parts) with your normal soap.             6.  Wash thoroughly, paying special attention to the area where your surgery  will be performed.  7.  Thoroughly rinse your body with warm water from the neck down.  8.  DO NOT shower/wash with your normal soap after using and rinsing off  the CHG Soap.                9.  Pat yourself dry with a clean towel.            10.  Wear clean pajamas.            11.  Place clean sheets on your bed the night of your first shower and do not  sleep with pets. Day of Surgery : Do not apply any lotions/deodorants the morning of surgery.  Please wear clean clothes to the hospital/surgery center.  FAILURE TO  FOLLOW THESE INSTRUCTIONS MAY RESULT IN THE CANCELLATION OF YOUR SURGERY PATIENT SIGNATURE_________________________________  NURSE SIGNATURE__________________________________  ________________________________________________________________________

## 2019-05-21 NOTE — Progress Notes (Signed)
LOV DR HARDING [facility] 08-29-18 Epic EKG 08-29-18 Epic TELEPHONE NOTE OPNE TO DR HARDING REQUESTING TO HOLD PLAVIX X 5 DAYS 05-19-19 EPIC

## 2019-05-22 ENCOUNTER — Encounter (HOSPITAL_COMMUNITY): Payer: Self-pay

## 2019-05-22 ENCOUNTER — Other Ambulatory Visit: Payer: Self-pay

## 2019-05-22 ENCOUNTER — Encounter (HOSPITAL_COMMUNITY)
Admission: RE | Admit: 2019-05-22 | Discharge: 2019-05-22 | Disposition: A | Discharge status: Home / Self Care | Payer: Medicare Other | Source: Ambulatory Visit | Attending: Urology | Admitting: Urology

## 2019-05-22 DIAGNOSIS — Z803 Family history of malignant neoplasm of breast: Secondary | ICD-10-CM | POA: Diagnosis not present

## 2019-05-22 DIAGNOSIS — M069 Rheumatoid arthritis, unspecified: Secondary | ICD-10-CM | POA: Diagnosis not present

## 2019-05-22 DIAGNOSIS — N201 Calculus of ureter: Secondary | ICD-10-CM | POA: Diagnosis present

## 2019-05-22 DIAGNOSIS — Z79899 Other long term (current) drug therapy: Secondary | ICD-10-CM | POA: Diagnosis not present

## 2019-05-22 DIAGNOSIS — I2510 Atherosclerotic heart disease of native coronary artery without angina pectoris: Secondary | ICD-10-CM | POA: Diagnosis not present

## 2019-05-22 DIAGNOSIS — E785 Hyperlipidemia, unspecified: Secondary | ICD-10-CM | POA: Diagnosis not present

## 2019-05-22 DIAGNOSIS — Z6835 Body mass index (BMI) 35.0-35.9, adult: Secondary | ICD-10-CM | POA: Diagnosis not present

## 2019-05-22 DIAGNOSIS — D649 Anemia, unspecified: Secondary | ICD-10-CM | POA: Diagnosis not present

## 2019-05-22 DIAGNOSIS — Z791 Long term (current) use of non-steroidal anti-inflammatories (NSAID): Secondary | ICD-10-CM | POA: Diagnosis not present

## 2019-05-22 DIAGNOSIS — Z8249 Family history of ischemic heart disease and other diseases of the circulatory system: Secondary | ICD-10-CM | POA: Diagnosis not present

## 2019-05-22 DIAGNOSIS — I252 Old myocardial infarction: Secondary | ICD-10-CM | POA: Diagnosis not present

## 2019-05-22 DIAGNOSIS — Z809 Family history of malignant neoplasm, unspecified: Secondary | ICD-10-CM | POA: Diagnosis not present

## 2019-05-22 DIAGNOSIS — M1990 Unspecified osteoarthritis, unspecified site: Secondary | ICD-10-CM | POA: Diagnosis not present

## 2019-05-22 DIAGNOSIS — Z87891 Personal history of nicotine dependence: Secondary | ICD-10-CM | POA: Diagnosis not present

## 2019-05-22 HISTORY — DX: Rheumatoid arthritis, unspecified: M06.9

## 2019-05-22 HISTORY — DX: Shortness of breath: R06.02

## 2019-05-22 HISTORY — DX: Anemia, unspecified: D64.9

## 2019-05-22 LAB — CBC
HCT: 31 % — ABNORMAL LOW (ref 36.0–46.0)
Hemoglobin: 9.6 g/dL — ABNORMAL LOW (ref 12.0–15.0)
MCH: 31.1 pg (ref 26.0–34.0)
MCHC: 31 g/dL (ref 30.0–36.0)
MCV: 100.3 fL — ABNORMAL HIGH (ref 80.0–100.0)
Platelets: 251 10*3/uL (ref 150–400)
RBC: 3.09 MIL/uL — ABNORMAL LOW (ref 3.87–5.11)
RDW: 13.1 % (ref 11.5–15.5)
WBC: 9.9 10*3/uL (ref 4.0–10.5)
nRBC: 0 % (ref 0.0–0.2)

## 2019-05-22 LAB — BASIC METABOLIC PANEL
Anion gap: 8 (ref 5–15)
BUN: 22 mg/dL (ref 8–23)
CO2: 28 mmol/L (ref 22–32)
Calcium: 8.8 mg/dL — ABNORMAL LOW (ref 8.9–10.3)
Chloride: 106 mmol/L (ref 98–111)
Creatinine, Ser: 2.16 mg/dL — ABNORMAL HIGH (ref 0.44–1.00)
GFR calc Af Amer: 27 mL/min — ABNORMAL LOW (ref >60)
GFR calc non Af Amer: 23 mL/min — ABNORMAL LOW (ref >60)
Glucose, Bld: 104 mg/dL — ABNORMAL HIGH (ref 70–99)
Potassium: 3.7 mmol/L (ref 3.5–5.1)
Sodium: 142 mmol/L (ref 135–145)

## 2019-05-22 NOTE — Anesthesia Preprocedure Evaluation (Addendum)
Anesthesia Evaluation  Patient identified by MRN, date of birth, ID band Patient awake    Reviewed: Allergy & Precautions, H&P , NPO status , Patient's Chart, lab work & pertinent test results, reviewed documented beta blocker date and time   History of Anesthesia Complications (+) PONV and history of anesthetic complications  Airway Mallampati: III  TM Distance: >3 FB Neck ROM: full    Dental no notable dental hx.    Pulmonary neg pulmonary ROS, former smoker,    Pulmonary exam normal breath sounds clear to auscultation       Cardiovascular Exercise Tolerance: Good hypertension, Pt. on medications + CAD, + Past MI and + Cardiac Stents   Rhythm:regular Rate:Normal  History of ST elevation myocardial infarction (STEMI) of anterior wall November 2012   With cardiac arrest, 100% mobility occlusion. --> Promus DES      Neuro/Psych negative neurological ROS  negative psych ROS   GI/Hepatic negative GI ROS, Neg liver ROS,   Endo/Other  Morbid obesity  Renal/GU ARFRenal disease  negative genitourinary   Musculoskeletal  (+) Arthritis , Osteoarthritis,    Abdominal   Peds  Hematology  (+) Blood dyscrasia, anemia ,   Anesthesia Other Findings   Reproductive/Obstetrics negative OB ROS                            Anesthesia Physical Anesthesia Plan  ASA: III  Anesthesia Plan: General   Post-op Pain Management:    Induction: Intravenous  PONV Risk Score and Plan: 3 and Ondansetron, Treatment may vary due to age or medical condition and Dexamethasone  Airway Management Planned: LMA and Oral ETT  Additional Equipment:   Intra-op Plan:   Post-operative Plan:   Informed Consent: I have reviewed the patients History and Physical, chart, labs and discussed the procedure including the risks, benefits and alternatives for the proposed anesthesia with the patient or authorized representative  who has indicated his/her understanding and acceptance.     Dental Advisory Given  Plan Discussed with: CRNA, Anesthesiologist and Surgeon  Anesthesia Plan Comments: (See PAT note 05/22/2019, Konrad Felix, PA-C)       Anesthesia Quick Evaluation

## 2019-05-22 NOTE — Progress Notes (Signed)
Anesthesia Chart Review   Case: [id number] Date/Time: 05/25/19 0845   Procedure: CYSTOSCOPY BILATERAL URETEROSCOPY/HOLMIUM LASER/STENT PLACEMENT (Bilateral )   Anesthesia type: General   Pre-op diagnosis: BILATERAL URETERAL STONES   [facility] / [facility]   Surgeon: Lucas Mallow, MD      DISCUSSION:68 y.o. former smoker (40 pack years, quit 08/27/11) with h/o PONV, CAD (DES 2012), RA, HTN, CKD (creatinine stable), anemia, bilateral ureteral stones scheduled for above procedure 05/25/2019 with Dr. Link Snuffer.   Cleared by cardiology 05/21/2019.  Per Roby Lofts, PA-C, "Per Dr. Allison Quarry prior recommendations in 2019, and given that there has been no recent coronary artery stents or interval change in cardiac history, patient can hold plavix x5 days prior to upcoming urologic procedure. She should restart plavix when cleared to do so by her urologist.  Patient was notified of these recommendations and reports her last dose of plavix was 05/20/2019."  S/p cystoscopy 05/11/2019 with no anesthesia complications noted.   Anticipate pt can proceed with planned procedure barring acute status change.   VS: BP 134/72   Temp (!) 34 C   Ht 5\' 1"  (1.549 m)   Wt 85.7 kg   BMI 35.71 kg/m   PROVIDERS: Antony Contras, MD is PCP   Glenetta Hew, MD is Cardiologist  LABS: Labs reviewed: Acceptable for surgery. (all labs ordered are listed, but only abnormal results are displayed)  Labs Reviewed  BASIC METABOLIC PANEL - Abnormal; Notable for the following components:      Result Value   Glucose, Bld 104 (*)    Creatinine, Ser 2.16 (*)    Calcium 8.8 (*)    GFR calc non Af Amer 23 (*)    GFR calc Af Amer 27 (*)    All other components within normal limits  CBC - Abnormal; Notable for the following components:   RBC 3.09 (*)    Hemoglobin 9.6 (*)    HCT 31.0 (*)    MCV 100.3 (*)    All other components within normal limits     IMAGES: CT Abdomen 05/11/2019 IMPRESSION: 1. There  is a 4 x 3 mm calculus in the distal right ureter at the superior acetabular level with fairly mild hydronephrosis on the right. The right kidney is edematous with perinephric stranding and fluid.  2. 3 x 2 mm calculus distal left ureter at the superior acetabular level with slight hydronephrosis on the left. Minimal perinephric stranding on the left.  3. No bowel obstruction. No abscess in the abdomen or pelvis. No periappendiceal region inflammatory region change.  4.  Foci of aortic and iliac artery atherosclerosis noted.  5. Small amount of pericardial fluid which may be within physiologic range.  6.  Small hiatal hernia.  EKG: 08/29/18 Rate 84 bpm Normal sinus rhythm  Inferior infarct, age undetermined   CV:  Past Medical History:  Diagnosis Date  . Anemia    iron deficiency anemia   . CAD S/P percutaneous coronary angioplasty November 2012   Promus DES - mid LAD 3.5 mm x 24 mm (3.72 mm)   . Complication of anesthesia   . Dyslipidemia, goal LDL below 70     on statin, close to goal  . Former moderate cigarette smoker (10-19 per day)   . Glucose intolerance (impaired glucose tolerance)   . H/O thyroid nodule    Benign  . History of ST elevation myocardial infarction (STEMI) of anterior wall November 2012   With cardiac arrest,  100% mobility occlusion. --> Promus DES   . Hypertension   . PONV (postoperative nausea and vomiting)    no issues with surgery on 05-11-2019  . Rheumatoid arthritis ([place])    managed on humira   . SOB (shortness of breath) on exertion    reports "its been that way since my heart attack " repots no recurrence of MI sx since that time     Past Surgical History:  Procedure Laterality Date  . ABDOMINAL AORTAGRAM N/A 08/27/2011   Procedure: ABDOMINAL Maxcine Ham;  Surgeon: Troy Sine, MD;  [facility] CATH LAB;  Service: Cardiovascular;  Laterality: N/A;  . BREAST EXCISIONAL BIOPSY Left   . CARDIAC CATHETERIZATION  November 2012    For anterior STEMI/cardiac arrest -- 100% mid LAD occlusion  . CORONARY ANGIOPLASTY WITH STENT PLACEMENT  November 2012    mid LAD PCI --> Promus Element DES 3.5 mm at 24 mm (3.72 mm)  . CYSTOSCOPY WITH RETROGRADE PYELOGRAM, URETEROSCOPY AND STENT PLACEMENT Bilateral 05/11/2019   Procedure: CYSTOSCOPY WITH RETROGRADE PYELOGRAM,  AND STENT PLACEMENT;  Surgeon: Lucas Mallow, MD;  Location[facility];  Service: Urology;  Laterality: Bilateral;  . LEFT HEART CATHETERIZATION WITH CORONARY ANGIOGRAM N/A 08/27/2011   Procedure: LEFT HEART CATHETERIZATION WITH CORONARY ANGIOGRAM;  Surgeon: Troy Sine, MD;  Location: [facility] CATH LAB;  Service: Cardiovascular;  Laterality: N/A;  . PERCUTANEOUS CORONARY STENT INTERVENTION (PCI-S) N/A 08/27/2011   Procedure: PERCUTANEOUS CORONARY STENT INTERVENTION (PCI-S);  Surgeon: Troy Sine, MD;  Location: [facility] CATH LAB;  Service: Cardiovascular;  Laterality: N/A;  . TRANSTHORACIC ECHOCARDIOGRAM  November 2012   EF 40-45%, moderate at K. of mid and distal inferior septum and anterior apical myocardium. Grade 1 diastolic function. -- Followup echocardiogram to reassess his EF was denied by insurance company    MEDICATIONS: . Adalimumab (HUMIRA PEN) 40 MG/0.4ML PNKT  . amoxicillin-clavulanate (AUGMENTIN) 500-125 MG tablet  . atorvastatin (LIPITOR) 20 MG tablet  . atorvastatin (LIPITOR) 40 MG tablet  . carvedilol (COREG) 12.5 MG tablet  . carvedilol (COREG) 6.25 MG tablet  . Cholecalciferol (VITAMIN D3) 125 MCG (5000 UT) TABS  . clopidogrel (PLAVIX) 75 MG tablet  . CVS IRON 325 (65 Fe) MG tablet  . diclofenac sodium (VOLTAREN) 1 % GEL  . lisinopril (PRINIVIL,ZESTRIL) 2.5 MG tablet  . nitroGLYCERIN (NITROSTAT) 0.4 MG SL tablet  . oxyCODONE (OXY IR/ROXICODONE) 5 MG immediate release tablet  . promethazine (PHENERGAN) 25 MG suppository  . tiZANidine (ZANAFLEX) 2 MG tablet  . traZODone (DESYREL) 100 MG tablet   No current facility-administered medications for  this encounter.      Maia Plan [facility] Pre-Surgical Testing [phone] 05/22/19  12:58 PM

## 2019-05-25 ENCOUNTER — Ambulatory Visit (HOSPITAL_COMMUNITY): Payer: Medicare Other | Admitting: Anesthesiology

## 2019-05-25 ENCOUNTER — Encounter (HOSPITAL_COMMUNITY)
Admission: RE | Disposition: A | Discharge status: Home / Self Care | Payer: Self-pay | Source: Home / Self Care | Attending: Urology

## 2019-05-25 ENCOUNTER — Ambulatory Visit (HOSPITAL_COMMUNITY)
Admission: RE | Admit: 2019-05-25 | Discharge: 2019-05-25 | Disposition: A | Discharge status: Home / Self Care | Payer: Medicare Other | Attending: Urology | Admitting: Urology

## 2019-05-25 ENCOUNTER — Ambulatory Visit (HOSPITAL_COMMUNITY): Payer: Medicare Other | Admitting: Physician Assistant

## 2019-05-25 ENCOUNTER — Encounter (HOSPITAL_COMMUNITY): Payer: Self-pay

## 2019-05-25 ENCOUNTER — Ambulatory Visit (HOSPITAL_COMMUNITY): Payer: Medicare Other

## 2019-05-25 DIAGNOSIS — Z79899 Other long term (current) drug therapy: Secondary | ICD-10-CM | POA: Insufficient documentation

## 2019-05-25 DIAGNOSIS — N201 Calculus of ureter: Secondary | ICD-10-CM | POA: Diagnosis not present

## 2019-05-25 DIAGNOSIS — I252 Old myocardial infarction: Secondary | ICD-10-CM | POA: Diagnosis not present

## 2019-05-25 DIAGNOSIS — E785 Hyperlipidemia, unspecified: Secondary | ICD-10-CM | POA: Diagnosis not present

## 2019-05-25 DIAGNOSIS — I2510 Atherosclerotic heart disease of native coronary artery without angina pectoris: Secondary | ICD-10-CM | POA: Diagnosis not present

## 2019-05-25 DIAGNOSIS — Z8249 Family history of ischemic heart disease and other diseases of the circulatory system: Secondary | ICD-10-CM | POA: Insufficient documentation

## 2019-05-25 DIAGNOSIS — Z803 Family history of malignant neoplasm of breast: Secondary | ICD-10-CM | POA: Insufficient documentation

## 2019-05-25 DIAGNOSIS — M069 Rheumatoid arthritis, unspecified: Secondary | ICD-10-CM | POA: Insufficient documentation

## 2019-05-25 DIAGNOSIS — Z6835 Body mass index (BMI) 35.0-35.9, adult: Secondary | ICD-10-CM | POA: Insufficient documentation

## 2019-05-25 DIAGNOSIS — Z87891 Personal history of nicotine dependence: Secondary | ICD-10-CM | POA: Insufficient documentation

## 2019-05-25 DIAGNOSIS — Z809 Family history of malignant neoplasm, unspecified: Secondary | ICD-10-CM | POA: Insufficient documentation

## 2019-05-25 DIAGNOSIS — Z791 Long term (current) use of non-steroidal anti-inflammatories (NSAID): Secondary | ICD-10-CM | POA: Insufficient documentation

## 2019-05-25 DIAGNOSIS — M1990 Unspecified osteoarthritis, unspecified site: Secondary | ICD-10-CM | POA: Insufficient documentation

## 2019-05-25 DIAGNOSIS — D649 Anemia, unspecified: Secondary | ICD-10-CM | POA: Insufficient documentation

## 2019-05-25 HISTORY — PX: CYSTOSCOPY/URETEROSCOPY/HOLMIUM LASER/STENT PLACEMENT: SHX6546

## 2019-05-25 SURGERY — CYSTOSCOPY/URETEROSCOPY/HOLMIUM LASER/STENT PLACEMENT
Anesthesia: General | Laterality: Bilateral

## 2019-05-25 MED ORDER — CEFAZOLIN SODIUM-DEXTROSE 2-4 GM/100ML-% IV SOLN
2.0000 g | INTRAVENOUS | Status: AC
  Administered 2019-05-25: 09:00:00 2 g via INTRAVENOUS
  Filled 2019-05-25: qty 100

## 2019-05-25 MED ORDER — IOHEXOL 300 MG/ML  SOLN
INTRAMUSCULAR | Status: DC | PRN
  Administered 2019-05-25: 25 mL

## 2019-05-25 MED ORDER — PROMETHAZINE HCL 25 MG/ML IJ SOLN
INTRAMUSCULAR | Status: AC
  Administered 2019-05-25: 6.25 mg via INTRAVENOUS
  Filled 2019-05-25: qty 1

## 2019-05-25 MED ORDER — ROCURONIUM BROMIDE 10 MG/ML (PF) SYRINGE
PREFILLED_SYRINGE | INTRAVENOUS | Status: DC | PRN
  Administered 2019-05-25: 20 mg via INTRAVENOUS

## 2019-05-25 MED ORDER — MEPERIDINE HCL 50 MG/ML IJ SOLN: 6.2500 mg | INTRAMUSCULAR | Status: DC | PRN

## 2019-05-25 MED ORDER — SUCCINYLCHOLINE CHLORIDE 200 MG/10ML IV SOSY
PREFILLED_SYRINGE | INTRAVENOUS | Status: DC | PRN
  Administered 2019-05-25: 200 mg via INTRAVENOUS

## 2019-05-25 MED ORDER — OXYCODONE HCL 5 MG PO TABS: 5.0000 mg | ORAL_TABLET | ORAL | 0 refills | Status: DC | PRN

## 2019-05-25 MED ORDER — SUGAMMADEX SODIUM 200 MG/2ML IV SOLN
INTRAVENOUS | Status: DC | PRN
  Administered 2019-05-25: 400 mg via INTRAVENOUS

## 2019-05-25 MED ORDER — BELLADONNA ALKALOIDS-OPIUM 16.2-30 MG RE SUPP
RECTAL | Status: AC
  Filled 2019-05-25: qty 1

## 2019-05-25 MED ORDER — ONDANSETRON HCL 4 MG/2ML IJ SOLN
INTRAMUSCULAR | Status: AC
  Filled 2019-05-25: qty 2

## 2019-05-25 MED ORDER — PROPOFOL 10 MG/ML IV BOLUS
INTRAVENOUS | Status: DC | PRN
  Administered 2019-05-25: 200 mg via INTRAVENOUS

## 2019-05-25 MED ORDER — LIDOCAINE HCL URETHRAL/MUCOSAL 2 % EX GEL
CUTANEOUS | Status: AC
  Filled 2019-05-25: qty 5

## 2019-05-25 MED ORDER — ACETAMINOPHEN 325 MG PO TABS: 325.0000 mg | ORAL_TABLET | ORAL | Status: DC | PRN

## 2019-05-25 MED ORDER — ONDANSETRON HCL 4 MG/2ML IJ SOLN
INTRAMUSCULAR | Status: DC | PRN
  Administered 2019-05-25: 4 mg via INTRAVENOUS

## 2019-05-25 MED ORDER — ONDANSETRON HCL 4 MG/2ML IJ SOLN
4.0000 mg | Freq: Once | INTRAMUSCULAR | Status: AC | PRN
  Administered 2019-05-25: 11:00:00 4 mg via INTRAVENOUS

## 2019-05-25 MED ORDER — OXYCODONE HCL 5 MG PO TABS: 5.0000 mg | ORAL_TABLET | Freq: Once | ORAL | Status: DC | PRN

## 2019-05-25 MED ORDER — SODIUM CHLORIDE 0.9 % IR SOLN
Status: DC | PRN
  Administered 2019-05-25: 6000 mL

## 2019-05-25 MED ORDER — ONDANSETRON HCL 4 MG/2ML IJ SOLN
INTRAMUSCULAR | Status: AC
  Administered 2019-05-25: 11:00:00 4 mg via INTRAVENOUS
  Filled 2019-05-25: qty 2

## 2019-05-25 MED ORDER — SUCCINYLCHOLINE CHLORIDE 200 MG/10ML IV SOSY
PREFILLED_SYRINGE | INTRAVENOUS | Status: AC
  Filled 2019-05-25: qty 10

## 2019-05-25 MED ORDER — PROPOFOL 10 MG/ML IV BOLUS
INTRAVENOUS | Status: AC
  Filled 2019-05-25: qty 20

## 2019-05-25 MED ORDER — DEXAMETHASONE SODIUM PHOSPHATE 10 MG/ML IJ SOLN
INTRAMUSCULAR | Status: DC | PRN
  Administered 2019-05-25: 5 mg via INTRAVENOUS

## 2019-05-25 MED ORDER — ROCURONIUM BROMIDE 10 MG/ML (PF) SYRINGE
PREFILLED_SYRINGE | INTRAVENOUS | Status: AC
  Filled 2019-05-25: qty 10

## 2019-05-25 MED ORDER — MIDAZOLAM HCL 2 MG/2ML IJ SOLN
INTRAMUSCULAR | Status: AC
  Filled 2019-05-25: qty 2

## 2019-05-25 MED ORDER — FENTANYL CITRATE (PF) 100 MCG/2ML IJ SOLN
INTRAMUSCULAR | Status: AC
  Filled 2019-05-25: qty 2

## 2019-05-25 MED ORDER — LIDOCAINE 2% (20 MG/ML) 5 ML SYRINGE
INTRAMUSCULAR | Status: AC
  Filled 2019-05-25: qty 5

## 2019-05-25 MED ORDER — ACETAMINOPHEN 10 MG/ML IV SOLN
INTRAVENOUS | Status: AC
  Administered 2019-05-25: 13:00:00 1000 mg via INTRAVENOUS
  Filled 2019-05-25: qty 100

## 2019-05-25 MED ORDER — INDIGOTINDISULFONATE SODIUM 8 MG/ML IJ SOLN
INTRAMUSCULAR | Status: AC
  Filled 2019-05-25: qty 5

## 2019-05-25 MED ORDER — ACETAMINOPHEN 10 MG/ML IV SOLN
1000.0000 mg | Freq: Once | INTRAVENOUS | Status: AC
  Administered 2019-05-25: 13:00:00 1000 mg via INTRAVENOUS

## 2019-05-25 MED ORDER — FENTANYL CITRATE (PF) 100 MCG/2ML IJ SOLN
25.0000 ug | INTRAMUSCULAR | Status: DC | PRN
  Administered 2019-05-25: 25 ug via INTRAVENOUS

## 2019-05-25 MED ORDER — LIDOCAINE 2% (20 MG/ML) 5 ML SYRINGE
INTRAMUSCULAR | Status: DC | PRN
  Administered 2019-05-25: 100 mg via INTRAVENOUS

## 2019-05-25 MED ORDER — LACTATED RINGERS IV SOLN
INTRAVENOUS | Status: DC
  Administered 2019-05-25: 07:00:00 via INTRAVENOUS

## 2019-05-25 MED ORDER — DEXAMETHASONE SODIUM PHOSPHATE 10 MG/ML IJ SOLN
INTRAMUSCULAR | Status: AC
  Filled 2019-05-25: qty 1

## 2019-05-25 MED ORDER — OXYCODONE HCL 5 MG/5ML PO SOLN: 5.0000 mg | Freq: Once | ORAL | Status: DC | PRN

## 2019-05-25 MED ORDER — ACETAMINOPHEN 160 MG/5ML PO SOLN: 325.0000 mg | ORAL | Status: DC | PRN

## 2019-05-25 MED ORDER — FENTANYL CITRATE (PF) 250 MCG/5ML IJ SOLN
INTRAMUSCULAR | Status: AC
  Filled 2019-05-25: qty 5

## 2019-05-25 MED ORDER — PROMETHAZINE HCL 25 MG/ML IJ SOLN
6.2500 mg | Freq: Once | INTRAMUSCULAR | Status: AC
  Administered 2019-05-25: 6.25 mg via INTRAVENOUS

## 2019-05-25 SURGICAL SUPPLY — 22 items
BAG URO CATCHER STRL LF (MISCELLANEOUS) ×2 IMPLANT
BASKET LASER NITINOL 1.9FR (BASKET) IMPLANT
BASKET ZERO TIP NITINOL 2.4FR (BASKET) IMPLANT
CATH INTERMIT  6FR 70CM (CATHETERS) IMPLANT
CATH URET 5FR 28IN CONE TIP (BALLOONS)
CATH URET 5FR 70CM CONE TIP (BALLOONS) IMPLANT
CLOTH BEACON ORANGE TIMEOUT ST (SAFETY) ×2 IMPLANT
COVER WAND RF STERILE (DRAPES) IMPLANT
EXTRACTOR STONE 1.7FRX115CM (UROLOGICAL SUPPLIES) IMPLANT
FIBER LASER FLEXIVA 365 (UROLOGICAL SUPPLIES) IMPLANT
FIBER LASER TRAC TIP (UROLOGICAL SUPPLIES) ×2 IMPLANT
GLOVE BIO SURGEON STRL SZ7.5 (GLOVE) ×2 IMPLANT
GOWN STRL REUS W/TWL XL LVL3 (GOWN DISPOSABLE) ×2 IMPLANT
GUIDEWIRE ANG ZIPWIRE 038X150 (WIRE) IMPLANT
GUIDEWIRE STR DUAL SENSOR (WIRE) ×2 IMPLANT
KIT TURNOVER KIT A (KITS) IMPLANT
MANIFOLD NEPTUNE II (INSTRUMENTS) ×2 IMPLANT
PACK CYSTO (CUSTOM PROCEDURE TRAY) ×2 IMPLANT
SHEATH URETERAL 12FRX28CM (UROLOGICAL SUPPLIES) IMPLANT
SHEATH URETERAL 12FRX35CM (MISCELLANEOUS) IMPLANT
STENT URET 6FRX24 CONTOUR (STENTS) ×4 IMPLANT
TUBING UROLOGY SET (TUBING) ×2 IMPLANT

## 2019-05-25 NOTE — Discharge Instructions (Addendum)
[facility] [phone] Post Ureteroscopy With or Without Stent Instructions  Definitions:  Ureter: The duct that transports urine from the kidney to the bladder. Stent:   A plastic hollow tube that is placed into the ureter, from the kidney to the                 bladder to prevent the ureter from swelling shut.  GENERAL INSTRUCTIONS:  Despite the fact that no skin incisions were used, the area around the ureter and bladder is raw and irritated. The stent is a foreign body which will further irritate the bladder wall. This irritation is manifested by increased frequency of urination, both day and night, and by an increase in the urge to urinate. In some, the urge to urinate is present almost always. Sometimes the urge is strong enough that you may not be able to stop yourself from urinating. The only real cure is to remove the stent and then give time for the bladder wall to heal which can't be done until the danger of the ureter swelling shut has passed, which varies.  You may see some blood in your urine while the stent is in place and a few days afterwards. Do not be alarmed, even if the urine was clear for a while. Get off your feet and drink lots of fluids until clearing occurs. If you start to pass clots or don't improve, call us.  DIET: You may return to your normal diet immediately. Because of the raw surface of your bladder, alcohol, spicy foods, acid type foods and drinks with caffeine may cause irritation or frequency and should be used in moderation. To keep your urine flowing freely and to avoid constipation, drink plenty of fluids during the day ( 8-10 glasses ). Tip: Avoid cranberry juice because it is very acidic.  ACTIVITY: Your physical activity doesn't need to be restricted. However, if you are very active, you may see some blood in your urine. We suggest that you reduce your activity under these circumstances until the bleeding has stopped.  BOWELS: It is  important to keep your bowels regular during the postoperative period. Straining with bowel movements can cause bleeding. A bowel movement every other day is reasonable. Use a mild laxative if needed, such as Milk of Magnesia 2-3 tablespoons, or 2 Dulcolax tablets. Call if you continue to have problems. If you have been taking narcotics for pain, before, during or after your surgery, you may be constipated. Take a laxative if necessary.   MEDICATION: You should resume your pre-surgery medications unless told not to. You may take oxybutynin or flomax if prescribed for bladder spasms or discomfort from the stent Take pain medication as directed for pain refractory to conservative management  PROBLEMS YOU SHOULD REPORT TO [place]:  Fevers over 100.5 Fahrenheit.  Heavy bleeding, or clots ( See above notes about blood in urine ).  Inability to urinate.  Drug reactions ( hives, rash, nausea, vomiting, diarrhea ).  Severe burning or pain with urination that is not improving.   General Anesthesia, Adult, Care After This sheet gives you information about how to care for yourself after your procedure. Your health care provider may also give you more specific instructions. If you have problems or questions, contact your health care provider. What can I expect after the procedure? After the procedure, the following side effects are common:  Pain or discomfort at the IV site.  Nausea.  Vomiting.  Sore throat.  Trouble concentrating.  Feeling  cold or chills.  Weak or tired.  Sleepiness and fatigue.  Soreness and body aches. These side effects can affect parts of the body that were not involved in surgery. Follow these instructions at home:  For at least 24 hours after the procedure:  Have a responsible adult stay with you. It is important to have someone help care for you until you are awake and alert.  Rest as needed.  Do not: ? Participate in activities in which you could fall or  become injured. ? Drive. ? Use heavy machinery. ? Drink alcohol. ? Take sleeping pills or medicines that cause drowsiness. ? Make important decisions or sign legal documents. ? Take care of children on your own. Eating and drinking  Follow any instructions from your health care provider about eating or drinking restrictions.  When you feel hungry, start by eating small amounts of foods that are soft and easy to digest (bland), such as toast. Gradually return to your regular diet.  Drink enough fluid to keep your urine pale yellow.  If you vomit, rehydrate by drinking water, juice, or clear broth. General instructions  If you have sleep apnea, surgery and certain medicines can increase your risk for breathing problems. Follow instructions from your health care provider about wearing your sleep device: ? Anytime you are sleeping, including during daytime naps. ? While taking prescription pain medicines, sleeping medicines, or medicines that make you drowsy.  Return to your normal activities as told by your health care provider. Ask your health care provider what activities are safe for you.  Take over-the-counter and prescription medicines only as told by your health care provider.  If you smoke, do not smoke without supervision.  Keep all follow-up visits as told by your health care provider. This is important. Contact a health care provider if:  You have nausea or vomiting that does not get better with medicine.  You cannot eat or drink without vomiting.  You have pain that does not get better with medicine.  You are unable to pass urine.  You develop a skin rash.  You have a fever.  You have redness around your IV site that gets worse. Get help right away if:  You have difficulty breathing.  You have chest pain.  You have blood in your urine or stool, or you vomit blood. Summary  After the procedure, it is common to have a sore throat or nausea. It is also common  to feel tired.  Have a responsible adult stay with you for the first 24 hours after general anesthesia. It is important to have someone help care for you until you are awake and alert.  When you feel hungry, start by eating small amounts of foods that are soft and easy to digest (bland), such as toast. Gradually return to your regular diet.  Drink enough fluid to keep your urine pale yellow.  Return to your normal activities as told by your health care provider. Ask your health care provider what activities are safe for you. This information is not intended to replace advice given to you by your health care provider. Make sure you discuss any questions you have with your health care provider. Document Released: 01/07/2001 Document Revised: 10/04/2017 Document Reviewed: 05/17/2017 Elsevier Patient Education  2020 [employer].

## 2019-05-25 NOTE — Anesthesia Procedure Notes (Signed)
Procedure Name: Intubation Date/Time: 05/25/2019 9:22 AM Performed by: Myna Bright, CRNA Pre-anesthesia Checklist: Patient identified Patient Re-evaluated:Patient Re-evaluated prior to induction Oxygen Delivery Method: Circle system utilized Preoxygenation: Pre-oxygenation with 100% oxygen Induction Type: IV induction Ventilation: Two handed mask ventilation required Laryngoscope Size: Mac and 3 Grade View: Grade II Tube size: 7.0 mm Number of attempts: 1 Airway Equipment and Method: Stylet Placement Confirmation: ETT inserted through vocal cords under direct vision,  positive ETCO2,  CO2 detector and breath sounds checked- equal and bilateral Tube secured with: Tape Dental Injury: Teeth and Oropharynx as per pre-operative assessment

## 2019-05-25 NOTE — Transfer of Care (Signed)
Immediate Anesthesia Transfer of Care Note  Patient: Samantha Mcbride  Procedure(s) Performed: CYSTOSCOPY BILATERAL URETEROSCOPY/HOLMIUM LASER/STENT PLACEMENT (Bilateral )  Patient Location: PACU  Anesthesia Type:General  Level of Consciousness: sedated and responds to stimulation  Airway & Oxygen Therapy: Patient Spontanous Breathing and Patient connected to face mask oxygen  Post-op Assessment: Report given to RN, Post -op Vital signs reviewed and stable and Patient moving all extremities  Post vital signs: Reviewed and stable  Last Vitals:  Vitals Value Taken Time  BP    Temp    Pulse 77 05/25/19 1024  Resp 13 05/25/19 1024  SpO2 98 % 05/25/19 1024  Vitals shown include unvalidated device data.  Last Pain:  Vitals:   05/25/19 0714  TempSrc:   PainSc: 0-No pain         Complications: No apparent anesthesia complications

## 2019-05-25 NOTE — H&P (Signed)
H&P  Chief Complaint: Bilateral ureteral calculi  History of Present Illness: 68 year old female with bilateral ureteral calculi status post urgent ureteral stent placement presents for definitive management of her stones.  Past Medical History:  Diagnosis Date  . Anemia    iron deficiency anemia   . CAD S/P percutaneous coronary angioplasty November 2012   Promus DES - mid LAD 3.5 mm x 24 mm (3.72 mm)   . Complication of anesthesia   . Dyslipidemia, goal LDL below 70     on statin, close to goal  . Former moderate cigarette smoker (10-19 per day)   . Glucose intolerance (impaired glucose tolerance)   . H/O thyroid nodule    Benign  . History of ST elevation myocardial infarction (STEMI) of anterior wall November 2012   With cardiac arrest, 100% mobility occlusion. --> Promus DES   . Hypertension   . PONV (postoperative nausea and vomiting)    no issues with surgery on 05-11-2019  . Rheumatoid arthritis ([place])    managed on humira   . SOB (shortness of breath) on exertion    reports "its been that way since my heart attack " repots no recurrence of MI sx since that time    Past Surgical History:  Procedure Laterality Date  . ABDOMINAL AORTAGRAM N/A 08/27/2011   Procedure: ABDOMINAL Maxcine Ham;  Surgeon: Troy Sine, MD;  Location: [facility] CATH LAB;  Service: Cardiovascular;  Laterality: N/A;  . BREAST EXCISIONAL BIOPSY Left   . CARDIAC CATHETERIZATION  November 2012   For anterior STEMI/cardiac arrest -- 100% mid LAD occlusion  . CORONARY ANGIOPLASTY WITH STENT PLACEMENT  November 2012    mid LAD PCI --> Promus Element DES 3.5 mm at 24 mm (3.72 mm)  . CYSTOSCOPY WITH RETROGRADE PYELOGRAM, URETEROSCOPY AND STENT PLACEMENT Bilateral 05/11/2019   Procedure: CYSTOSCOPY WITH RETROGRADE PYELOGRAM,  AND STENT PLACEMENT;  Surgeon: Lucas Mallow, MD;  [facility];  Service: Urology;  Laterality: Bilateral;  . LEFT HEART CATHETERIZATION WITH CORONARY ANGIOGRAM N/A 08/27/2011    Procedure: LEFT HEART CATHETERIZATION WITH CORONARY ANGIOGRAM;  Surgeon: Troy Sine, MD;  Location: [facility] CATH LAB;  Service: Cardiovascular;  Laterality: N/A;  . PERCUTANEOUS CORONARY STENT INTERVENTION (PCI-S) N/A 08/27/2011   Procedure: PERCUTANEOUS CORONARY STENT INTERVENTION (PCI-S);  Surgeon: Troy Sine, MD;  Location: [facility] CATH LAB;  Service: Cardiovascular;  Laterality: N/A;  . TRANSTHORACIC ECHOCARDIOGRAM  November 2012   EF 40-45%, moderate at K. of mid and distal inferior septum and anterior apical myocardium. Grade 1 diastolic function. -- Followup echocardiogram to reassess his EF was denied by insurance company    Home Medications:  Medications Prior to Admission  Medication Sig Dispense Refill Last Dose  . atorvastatin (LIPITOR) 20 MG tablet TAKE 1 TABLET BY MOUTH EVERYDAY AT BEDTIME 30 tablet 6 05/24/2019 at Unknown time  . carvedilol (COREG) 12.5 MG tablet Take 12.5 mg by mouth 2 (two) times daily.   05/25/2019 at [place]  . Cholecalciferol (VITAMIN D3) 125 MCG (5000 UT) TABS Take 1 tablet by mouth daily.   05/24/2019 at Unknown time  . CVS IRON 325 (65 Fe) MG tablet Take 325 mg by mouth daily.   Past Week at Unknown time  . diclofenac sodium (VOLTAREN) 1 % GEL Apply 2 g topically daily as needed (pain).   Past Month at Unknown time  . tiZANidine (ZANAFLEX) 2 MG tablet Take 2 mg by mouth as needed for muscle spasms.   Past Month at Unknown  time  . traZODone (DESYREL) 100 MG tablet Take by mouth at bedtime. Dose unknown    05/24/2019 at Unknown time  . Adalimumab (HUMIRA PEN) 40 MG/0.4ML PNKT Inject 40 mg into the skin every 14 (fourteen) days.    05/04/2019  . atorvastatin (LIPITOR) 40 MG tablet TAKE 1 TABLET BY MOUTH AT BEDTIME 90 tablet 2 not taking  . carvedilol (COREG) 6.25 MG tablet TAKE 1 TABLET BY MOUTH TWICE A DAY WITH A MEAL (Patient not taking: Reported on 05/11/2019) 180 tablet 1 not taking  . clopidogrel (PLAVIX) 75 MG tablet TAKE 1 TABLET BY MOUTH DAILY 90 tablet 3 05/20/2019   . lisinopril (PRINIVIL,ZESTRIL) 2.5 MG tablet TAKE 1 TABLET BY MOUTH EVERY DAY (Patient not taking: Reported on 05/11/2019) 90 tablet 18 March 2019  . nitroGLYCERIN (NITROSTAT) 0.4 MG SL tablet PLACE 1 TABLET UNDER THE TONGUE EVERY 5 MINUTES AS NEEDED FOR CHEST PAIN. 25 tablet 4 never taken  . oxyCODONE (OXY IR/ROXICODONE) 5 MG immediate release tablet Take 1 tablet (5 mg total) by mouth every 4 (four) hours as needed for moderate pain. 10 tablet 0 never  . promethazine (PHENERGAN) 25 MG suppository Place 1 suppository (25 mg total) rectally every 6 (six) hours as needed for nausea or vomiting. 12 each 0 not started   Allergies: No Known Allergies  Family History  Problem Relation Age of Onset  . Cancer Mother   . Hypertension Mother   . Breast cancer Mother        early 54's  . Heart failure Father   . Breast cancer Sister        unsure of age   Social History:  reports that she quit smoking about 7 years ago. Her smoking use included cigarettes. She has a 40.00 pack-year smoking history. She has never used smokeless tobacco. She reports that she does not drink alcohol or use drugs.  ROS: A complete review of systems was performed.  All systems are negative except for pertinent findings as noted. ROS   Physical Exam:  Vital signs in last 24 hours: Temp:  [98.9 F (37.2 C)] 98.9 F (37.2 C) (08/10 0702) Pulse Rate:  [73] 73 (08/10 0702) Resp:  [18] 18 (08/10 0702) BP: (149)/(82) 149/82 (08/10 0702) SpO2:  [100 %] 100 % (08/10 0702) Weight:  [85.7 kg] 85.7 kg (08/10 0656) General:  Alert and oriented, No acute distress HEENT: Normocephalic, atraumatic Neck: No JVD or lymphadenopathy Cardiovascular: Regular rate and rhythm Lungs: Regular rate and effort Abdomen: Soft, nontender, nondistended, no abdominal masses Back: No CVA tenderness Extremities: No edema Neurologic: Grossly intact  Laboratory Data:  No results found for this or any previous visit (from the past 24  hour(s)). Recent Results (from the past 240 hour(s))  SARS CORONAVIRUS 2 Nasal Swab Aptima Multi Swab     Status: None   Collection Time: 05/21/19 11:36 AM   Specimen: Aptima Multi Swab; Nasal Swab  Result Value Ref Range Status   SARS Coronavirus 2 NEGATIVE NEGATIVE Final    Comment: (NOTE) SARS-CoV-2 target nucleic acids are NOT DETECTED. The SARS-CoV-2 RNA is generally detectable in upper and lower respiratory specimens during the acute phase of infection. Negative results do not preclude SARS-CoV-2 infection, do not rule out co-infections with other pathogens, and should not be used as the sole basis for treatment or other patient management decisions. Negative results must be combined with clinical observations, patient history, and epidemiological information. The expected result is Negative. Fact Sheet for Patients: SugarRoll.be  Fact Sheet for Healthcare Providers: https://www.woods-mathews.com/ This test is not yet approved or cleared by the [place] FDA and  has been authorized for detection and/or diagnosis of SARS-CoV-2 by FDA under an Emergency Use Authorization (EUA). This EUA will remain  in effect (meaning this test can be used) for the duration of the COVID-19 declaration under Section 56 4(b)(1) of the Act, 21 U.S.C. section 360bbb-3(b)(1), unless the authorization is terminated or revoked sooner. Performed at [facility], [place] [address], [place],  [address]    Creatinine: Recent Labs    05/22/19 4643  CREATININE 2.16*    Impression/Assessment:  Bilateral ureteral calculi  Plan:  Proceed with ureteroscopy/laser lithotripsy/ureteral stent exchange  Marton Redwood, III 05/25/2019, 8:59 AM

## 2019-05-25 NOTE — Anesthesia Postprocedure Evaluation (Signed)
Anesthesia Post Note  Patient: Samantha Mcbride  Procedure(s) Performed: CYSTOSCOPY BILATERAL URETEROSCOPY/HOLMIUM LASER/STENT PLACEMENT (Bilateral )     Patient location during evaluation: PACU Anesthesia Type: General Level of consciousness: awake and alert Pain management: pain level controlled Vital Signs Assessment: post-procedure vital signs reviewed and stable Respiratory status: spontaneous breathing, nonlabored ventilation, respiratory function stable and patient connected to nasal cannula oxygen Cardiovascular status: blood pressure returned to baseline and stable Postop Assessment: no apparent nausea or vomiting Anesthetic complications: no    Last Vitals:  Vitals:   05/25/19 1245 05/25/19 1300  BP: (!) 163/71   Pulse: 63   Resp:  18  Temp:    SpO2: 99%     Last Pain:  Vitals:   05/25/19 1300  TempSrc:   PainSc: 3                  [name]

## 2019-05-25 NOTE — Op Note (Signed)
Operative Note  Preoperative diagnosis:  1.  Bilateral ureteral calculi  Postoperative diagnosis: 1.  Bilateral ureteral calculi  Procedure(s): 1.  Cystoscopy with bilateral retrograde pyelogram, bilateral ureteroscopy with laser lithotripsy, ureteral stent placement/replacement  Surgeon: Link Snuffer, MD  Assistants: None  Anesthesia: General  Complications: None immediate  EBL: Minimal  Specimens: 1.  None  Drains/Catheters: 1.  Bilateral 6 x 24 double-J ureteral stents  Intraoperative findings: 1.  Normal urethra and bladder mucosa 2.  Left retrograde pyelogram revealed no filling defect or hydronephrosis.  Ureteroscopy revealed a 4 mm calculus that was fragmented to tiny fragments. 3.  Right retrograde pyelogram revealed no filling defects or hydronephrosis.  Ureteroscopy revealed a 4 mm distal ureteral calculus fragmented to tiny fragments.  Indication: 68 year old female status post urgent ureteral stent placement for bilateral ureteral calculi presents for the previously mentioned operation.  Description of procedure:  The patient was identified and consent was obtained.  The patient was taken to the operating room and placed in the supine position.  The patient was placed under general anesthesia.  Perioperative antibiotics were administered.  The patient was placed in dorsal lithotomy.  Patient was prepped and draped in a standard sterile fashion and a timeout was performed.  A 21 French rigid cystoscope was advanced into the urethra and into the bladder.  Complete cystoscopy was performed with no abnormal findings.  The left stent was withdrawn and a wire was advanced up the stent and into the kidney followed by removal of the stent.  A semirigid ureteroscopy was performed on the left and the stone encountered was fragmented to tiny fragments.  I advanced the scope up to the renal pelvis and no other stone fragments were seen.  I shot a retrograde pyelogram through the  scope with the findings noted above.  I then withdrew the scope and backloaded the wire onto the rigid cystoscope followed by routine placement of a 6 x 24 double-J ureteral stent.  After removal of the wire, fluoroscopy confirmed proximal placement and direct visualization confirmed a good coil in the bladder.  I grasped the right stent and pulled it beyond the urethral meatus.  I advanced a wire through this and up to the kidney under fluoroscopic guidance followed by removal of the stent.  Semirigid ureteroscopy was performed on the right and the stone was encountered followed by fragmentation into tiny fragments.  I advanced the scope up to the mid ureter and no stone fragments were seen.  I shot a retrograde pyelogram from here and there were no filling defects or hydronephrosis noted.  I therefore withdrew the scope and backloaded wire onto the rigid cystoscope followed by routine placement of a 6 x 24 double-J ureteral stent fluoroscopy confirmed proximal placement and direct visualization confirmed a good coil within the bladder.  I drained the bladder and withdrew the scope.  Patient tolerated procedure well was stable postoperatively.  Plan: Follow-up in 1 week for stent removal

## 2019-05-26 ENCOUNTER — Encounter (HOSPITAL_COMMUNITY): Payer: Self-pay | Admitting: Urology

## 2019-05-28 DIAGNOSIS — M25562 Pain in left knee: Secondary | ICD-10-CM | POA: Insufficient documentation

## 2019-06-19 ENCOUNTER — Other Ambulatory Visit: Payer: Self-pay | Admitting: Cardiology

## 2019-09-01 ENCOUNTER — Encounter: Payer: Self-pay | Admitting: Cardiology

## 2019-09-01 ENCOUNTER — Ambulatory Visit: Payer: Medicare Other | Admitting: Cardiology

## 2019-09-01 ENCOUNTER — Other Ambulatory Visit: Payer: Self-pay

## 2019-09-01 VITALS — BP 136/87 | HR 68 | Temp 96.9°F | Ht 61.0 in | Wt 190.6 lb

## 2019-09-01 DIAGNOSIS — Z9861 Coronary angioplasty status: Secondary | ICD-10-CM

## 2019-09-01 DIAGNOSIS — I2109 ST elevation (STEMI) myocardial infarction involving other coronary artery of anterior wall: Secondary | ICD-10-CM | POA: Diagnosis not present

## 2019-09-01 DIAGNOSIS — I10 Essential (primary) hypertension: Secondary | ICD-10-CM

## 2019-09-01 DIAGNOSIS — E785 Hyperlipidemia, unspecified: Secondary | ICD-10-CM | POA: Diagnosis not present

## 2019-09-01 DIAGNOSIS — I2510 Atherosclerotic heart disease of native coronary artery without angina pectoris: Secondary | ICD-10-CM | POA: Diagnosis not present

## 2019-09-01 DIAGNOSIS — E669 Obesity, unspecified: Secondary | ICD-10-CM

## 2019-09-01 NOTE — Patient Instructions (Signed)

## 2019-09-01 NOTE — Assessment & Plan Note (Addendum)
LDL not @ goal -- was reduced to 20 mg Atorvastatin with plan to add Repatha -- but this did not happen for unclear reasons.  Last LDL 75 -- due for recheck in Dec. If LDL not < 70 - would add Zetia 10 mg (would ask PCP to make this adjustment pending lab check). ->  Would also consider converting to rosuvastatin at 20mg  for more potency.  --> I did not give her a prescription for Zetia or rosuvastatin (I think we can probably get to goal with Zetia in addition to statin), because she wanted to wait until her labs were checked.  I suspect that she will likely require it, if Dr. Darrol Poke would prefer, I will be happy to call in the prescription pending lab results.  If this does not work, then we would need to discuss the PCSK9 inhibitor.

## 2019-09-01 NOTE — Progress Notes (Signed)
Primary Care Provider: Antony Contras, MD Cardiologist: Glenetta Hew, MD Nephrologist: Dr. Carolin Sicks  Clinic Note: Chief Complaint  Patient presents with   Follow-up    no complaints per patient, states that she is stage 4 kidney failure    Coronary Artery Disease    No angina, just deconditioned and therefore short of breath with exertion   Hyperlipidemia    Never started Repatha    HPI:    Samantha Mcbride is a 68 y.o. female with a history of Anterior STEMI complicated by [place] (LAD PCI) and now CKD-4 along with HYPERTENSION, HYPERLIPIDEMIA, AND RHEUMATOID ARTHRITIS who presents today for annual follow-up.  Samantha Mcbride was last seen on August 29, 2018-was recently diagnosed with rheumatoid arthritis at the time and started on prednisone.  Ended up on methotrexate and folic acid (currently on Humira) .->  Doing fairly well for quite standpoint doing Silver sneakers and chair yoga.  Had retired that year.  Lots of less stress.  Mostly limited by remote arthritis issues and being on prednisone.  No chest pain or pressure.   Referred to [facility] for lipid management- atorvastatin 20 mg & Repatha PA (never started Repatha).    Recent Hospitalizations: Cystoscopy-concern for obstructive uropathy  -Was seen by nephrology, ACE inhibitor stopped, carvedilol dose increased to 12 and half twice daily.  Reviewed  CV studies:    The following studies were reviewed today: (if available, images/films reviewed: From Epic Chart or Care Everywhere)  None:   Interval History:   Samantha Mcbride returns today overall feeling fine from a cardiac standpoint.  The major thing she notes is that she has just a little bit reduced get up and go.  Part of it is because she was not able to get into the [facility] for exercise because of COVID-19 lockdown.  Now that things open back up again she is little bit scared to go back.  She has not really been doing that much exercise and is somewhat  deconditioned at this point.  Her arthritis seems to be relatively stabilized right now, and is no longer on prednisone having switch to your Humira.  With the level of activity she is able to do, she is not having any resting exertional chest sinus pressure or dyspnea. Although she no longer has the stress from work, this year the stress with all the COVID-19 issues has really gotten somewhat upset.  Thankfully though she is actually, she has handled it quite well.  CV Review of Symptoms (Summary) positive for - dyspnea on exertion and Mostly related to deconditioning as she has not been as active as she had been before.  Hopes to get back and exercise. negative for - chest pain, edema, irregular heartbeat, orthopnea, palpitations, paroxysmal nocturnal dyspnea, rapid heart rate, shortness of breath or Syncope/near syncope, TIA/amaurosis fugax, claudication  The patient does not have symptoms concerning for COVID-19 infection (fever, chills, cough, or new shortness of breath).  The patient is practicing social distancing. ++ Masking.  Barely goes out for groceries/shopping.    REVIEWED OF SYSTEMS   A comprehensive ROS was performed. Review of Systems  Constitutional: Positive for malaise/fatigue (Less get up and go than usual.). Negative for weight loss.  HENT: Negative for congestion and nosebleeds.   Respiratory: Negative for cough, shortness of breath (Only with exertion) and wheezing.   Gastrointestinal: Negative for abdominal pain, blood in stool, heartburn and melena.  Genitourinary: Negative for hematuria (None since her cystoscopy.).  Musculoskeletal:  Positive for back pain (She is not nearly as troubled by her arthritis pains in her back with the sciatica.) and joint pain (Still has right worse than left hip and knee pain, but also improved).  Neurological: Negative for dizziness (Only if she stands up fast) and focal weakness.  Endo/Heme/Allergies: Negative for environmental  allergies. Does not bruise/bleed easily.  Psychiatric/Behavioral: Negative for depression (May be a little depressed mood, but not full depression with anhedonia and change in diet.) and memory loss. The patient is nervous/anxious (At baseline, but relatively controlled). The patient does not have insomnia.   All other systems reviewed and are negative.  I have reviewed and (if needed) personally updated the patient's problem list, medications, allergies, past medical and surgical history, social and family history.   PAST MEDICAL HISTORY   Past Medical History:  Diagnosis Date   Anemia    iron deficiency anemia    CAD S/P percutaneous coronary angioplasty November 2012   Promus DES - mid LAD 3.5 mm x 24 mm (2.45 mm)    Complication of anesthesia    Dyslipidemia, goal LDL below 70     on statin, close to goal   Former moderate cigarette smoker (10-19 per day)    Glucose intolerance (impaired glucose tolerance)    H/O thyroid nodule    Benign   History of ST elevation myocardial infarction (STEMI) of anterior wall November 2012   With cardiac arrest, 100% mobility occlusion. --> Promus DES    Hypertension    PONV (postoperative nausea and vomiting)    no issues with surgery on 05-11-2019   Rheumatoid arthritis ([place])    managed on humira    SOB (shortness of breath) on exertion    reports "its been that way since my heart attack " repots no recurrence of MI sx since that time      PAST SURGICAL HISTORY   Past Surgical History:  Procedure Laterality Date   ABDOMINAL AORTAGRAM N/A 08/27/2011   Procedure: ABDOMINAL Maxcine Ham;  Surgeon: Troy Sine, MD;  Location: [facility] CATH LAB;  Service: Cardiovascular;  Laterality: N/A;   BREAST EXCISIONAL BIOPSY Left    CARDIAC CATHETERIZATION  November 2012   For anterior STEMI/cardiac arrest -- 100% mid LAD occlusion   CORONARY ANGIOPLASTY WITH STENT PLACEMENT  November 2012    mid LAD PCI --> Promus Element DES 3.5 mm at 24  mm (3.72 mm)   CYSTOSCOPY WITH RETROGRADE PYELOGRAM, URETEROSCOPY AND STENT PLACEMENT Bilateral 05/11/2019   Procedure: CYSTOSCOPY WITH RETROGRADE PYELOGRAM,  AND STENT PLACEMENT;  Surgeon: Lucas Mallow, MD;  Location: [facility] ORS;  Service: Urology;  Laterality: Bilateral;   CYSTOSCOPY/URETEROSCOPY/HOLMIUM LASER/STENT PLACEMENT Bilateral 05/25/2019   Procedure: CYSTOSCOPY BILATERAL URETEROSCOPY/HOLMIUM LASER/STENT PLACEMENT;  Surgeon: Lucas Mallow, MD;  Location: [facility] ORS;  Service: Urology;  Laterality: Bilateral;   LEFT HEART CATHETERIZATION WITH CORONARY ANGIOGRAM N/A 08/27/2011   Procedure: LEFT HEART CATHETERIZATION WITH CORONARY ANGIOGRAM;  Surgeon: Troy Sine, MD;  Location: [facility] CATH LAB;  Service: Cardiovascular;  Laterality: N/A;   PERCUTANEOUS CORONARY STENT INTERVENTION (PCI-S) N/A 08/27/2011   Procedure: PERCUTANEOUS CORONARY STENT INTERVENTION (PCI-S);  Surgeon: Troy Sine, MD;  Location: [facility] CATH LAB;  Service: Cardiovascular;  Laterality: N/A;   TRANSTHORACIC ECHOCARDIOGRAM  November 2012   EF 40-45%, moderate at K. of mid and distal inferior septum and anterior apical myocardium. Grade 1 diastolic function. -- Followup echocardiogram to reassess his EF was denied by insurance company  MEDICATIONS/ALLERGIES   Current Meds  Medication Sig   Adalimumab (HUMIRA PEN) 40 MG/0.4ML PNKT Inject 40 mg into the skin every 14 (fourteen) days.    atorvastatin (LIPITOR) 20 MG tablet Take 1 tablet (20 mg total) by mouth at bedtime. *OFFICE VISIT NEEDED*   atorvastatin (LIPITOR) 40 MG tablet TAKE 1 TABLET BY MOUTH AT BEDTIME   carvedilol (COREG) 12.5 MG tablet Take 12.5 mg by mouth 2 (two) times daily.   Cholecalciferol (VITAMIN D3) 125 MCG (5000 UT) TABS Take 1 tablet by mouth daily.   clopidogrel (PLAVIX) 75 MG tablet TAKE 1 TABLET BY MOUTH DAILY   [facility] IRON 325 (65 Fe) MG tablet Take 325 mg by mouth daily.   diclofenac sodium (VOLTAREN) 1 % GEL Apply 2 g topically  daily as needed (pain).   nitroGLYCERIN (NITROSTAT) 0.4 MG SL tablet PLACE 1 TABLET UNDER THE TONGUE EVERY 5 MINUTES AS NEEDED FOR CHEST PAIN.   tiZANidine (ZANAFLEX) 2 MG tablet Take 2 mg by mouth as needed for muscle spasms.   traZODone (DESYREL) 100 MG tablet Take by mouth at bedtime. Dose unknown     No Known Allergies   SOCIAL HISTORY/FAMILY HISTORY   Social History   Tobacco Use   Smoking status: Former Smoker    Packs/day: 1.00    Years: 40.00    Pack years: 40.00    Types: Cigarettes    Quit date: 08/27/2011    Years since quitting: 8.0   Smokeless tobacco: Never Used  Substance Use Topics   Alcohol use: No   Drug use: No   Social History   Social History Narrative   Single mother of one, grandmother of 2. She works for [employer] for [employer].   She quit smoking time of her MI in November 2012. Does not take alcohol.   She had been doing really well he exercises at least 3-4 days a week, but really get discouraged that she is not been able to drop weight.    Family History family history includes Breast cancer in her mother and sister; Cancer in her mother; Heart failure in her father; Hypertension in her mother.   OBJCTIVE -PE, EKG, labs   Wt Readings from Last 3 Encounters:  09/01/19 190 lb 9.6 oz (86.5 kg)  05/25/19 189 lb (85.7 kg)  05/22/19 189 lb (85.7 kg)    Physical Exam: BP 136/87    Pulse 68    Temp (!) 96.9 F (36.1 C)    Ht 5\' 1"  (1.549 m)    Wt 190 lb 9.6 oz (86.5 kg)    SpO2 96%    BMI 36.01 kg/m  Physical Exam  Constitutional: She is oriented to person, place, and time. She appears well-developed and well-nourished.  Pleasant moderate to morbidly obese woman in no acute distress.  Well-groomed.  HENT:  Head: Normocephalic and atraumatic.  Neck: Normal range of motion. Neck supple. No hepatojugular reflux and no JVD present. Carotid bruit is not present.  Cardiovascular: Normal rate, regular rhythm, normal  heart sounds and intact distal pulses.  No extrasystoles are present. PMI is not displaced. Exam reveals no gallop and no friction rub.  No murmur heard. Pulmonary/Chest: Effort normal and breath sounds normal. No respiratory distress. She has no wheezes. She has no rales.  Abdominal: Soft. Bowel sounds are normal. She exhibits no distension. There is no abdominal tenderness. There is no rebound.  Musculoskeletal: Normal range of motion.        General: No  edema (Trivial ankle).  Neurological: She is alert and oriented to person, place, and time.  Psychiatric: She has a normal mood and affect. Her behavior is normal. Judgment and thought content normal.  Vitals reviewed.    Adult ECG Report  Rate: 68 ;  Rhythm: normal sinus rhythm and Cannot rule out anterior MI, age-indeterminate.  Otherwise normal axis, intervals and durations.;   Narrative Interpretation: Stable EKG  Recent Labs:    Lipids April 28, 2019: TC 143, TG 114, HDL 45, LDL 75. ->  Due for recheck December or January with PCP.  Lab Results  Component Value Date   CREATININE 2.16 (H) 05/22/2019   BUN 22 05/22/2019   NA 142 05/22/2019   K 3.7 05/22/2019   CL 106 05/22/2019   CO2 28 05/22/2019    07/21/2019: Hgb 10.3, CR 2.2, K+ 3.7.  TSH 1.48.   ASSESSMENT/PLAN    Problem List Items Addressed This Visit    H/O Anterior STEMI:  Proximal LAD  insertion of a 3.5x24 mm Promus element DES (08/2011) (Chronic)    Presentation from her anterior MI was actually cardiac arrest.  She initially was unable to recall the chest pain she had, but now recalls that being pretty significant chest tightness or pressure.  Had VF in the ER.  Interestingly, follow-up echocardiogram to reassess EF after initial MI was declined by her insurance company so we only know that her EF was 40 to 45% at that time. With no recurrent angina or heart failure symptoms, we have not rechecked.  She is on stable medications with no active angina or heart  failure.       Relevant Orders   EKG 12-Lead (Completed)   CAD S/P percutaneous coronary angioplasty (Chronic)    Large sized DES in the mid LAD.  No further angina.  No longer on ACE inhibitor because of renal insufficiency, but is still on beta-blocker and statin. On maintenance dose clopidogrel/Plavix. --Okay to hold Plavix 5 to 7 days preop for procedures.      Relevant Orders   EKG 12-Lead (Completed)   Dyslipidemia, goal LDL below 70 - Primary (Chronic)    LDL not @ goal -- was reduced to 20 mg Atorvastatin with plan to add Repatha -- but this did not happen for unclear reasons.  Last LDL 75 -- due for recheck in Dec. If LDL not < 70 - would add Zetia 10 mg (would ask PCP to make this adjustment pending lab check). ->  Would also consider converting to rosuvastatin at 20mg  for more potency.  --> I did not give her a prescription for Zetia or rosuvastatin (I think we can probably get to goal with Zetia in addition to statin), because she wanted to wait until her labs were checked.  I suspect that she will likely require it, if Dr. Darrol Poke would prefer, I will be happy to call in the prescription pending lab results.  If this does not work, then we would need to discuss the PCSK9 inhibitor.      Essential hypertension (Chronic)    ACE inhibitor stopped because of renal insufficiency and carvedilol was in creased.  At this point with her having some fatigue probably would not further increase carvedilol, if necessary would consider a dihydropyridine calcium channel blocker such as amlodipine or felodipine for additional blood pressure control.  Monitor for now.      Relevant Orders   EKG 12-Lead (Completed)   Obesity (BMI 30-39.9) (Chronic)  Discussed importance of dietary modification and weight loss.  I think she got little bit caught up in the concerns of the renal insufficiency and has become less active.  I explained to her that being active is important for maintaining  weight loss and therefore blood pressure control.  Also will help with glycemic and lipid control.          COVID-19 Education: The signs and symptoms of COVID-19 were discussed with the patient and how to seek care for testing (follow up with PCP or arrange E-visit).   The importance of social distancing was discussed today.  I spent a total of 3minutes with the patient and chart review. >  50% of the time was spent in direct patient consultation.  Additional time spent with chart review (studies, outside notes, etc): 8 Total Time: 30 min   Current medicines are reviewed at length with the patient today.  (+/- concerns) n/a   Patient Instructions / Medication Changes & Studies & Tests Ordered   Patient Instructions  Medication Instructions:  No changes  *If you need a refill on your cardiac medications before your next appointment, please call your pharmacy*  Lab Work: Not needed  Testing/Procedures: Not needed Follow-Up: At [facility], you and your health needs are our priority.  As part of our continuing mission to provide you with exceptional heart care, we have created designated Provider Care Teams.  These Care Teams include your primary Cardiologist (physician) and Advanced Practice Providers (APPs -  Physician Assistants and Nurse Practitioners) who all work together to provide you with the care you need, when you need it.  Your next appointment:   12 months  The format for your next appointment:   In Person  Provider:   Glenetta Hew, MD  Other Instructions   Studies Ordered:   Orders Placed This Encounter  Procedures   EKG 12-Lead     Glenetta Hew, M.D., M.S. Interventional Cardiologist   Pager # [phone] Phone # [phone] [address] [place], [place] [address]   Thank you for choosing Heartcare at [facility]!!

## 2019-09-02 ENCOUNTER — Encounter: Payer: Self-pay | Admitting: Cardiology

## 2019-09-02 NOTE — Assessment & Plan Note (Signed)
Presentation from her anterior MI was actually cardiac arrest.  She initially was unable to recall the chest pain she had, but now recalls that being pretty significant chest tightness or pressure.  Had VF in the ER.  Interestingly, follow-up echocardiogram to reassess EF after initial MI was declined by her insurance company so we only know that her EF was 40 to 45% at that time. With no recurrent angina or heart failure symptoms, we have not rechecked.  She is on stable medications with no active angina or heart failure.

## 2019-09-02 NOTE — Assessment & Plan Note (Signed)
ACE inhibitor stopped because of renal insufficiency and carvedilol was in creased.  At this point with her having some fatigue probably would not further increase carvedilol, if necessary would consider a dihydropyridine calcium channel blocker such as amlodipine or felodipine for additional blood pressure control.  Monitor for now.

## 2019-09-02 NOTE — Assessment & Plan Note (Signed)
Discussed importance of dietary modification and weight loss.  I think she got little bit caught up in the concerns of the renal insufficiency and has become less active.  I explained to her that being active is important for maintaining weight loss and therefore blood pressure control.  Also will help with glycemic and lipid control.

## 2019-09-02 NOTE — Assessment & Plan Note (Signed)
Large sized DES in the mid LAD.  No further angina.  No longer on ACE inhibitor because of renal insufficiency, but is still on beta-blocker and statin. On maintenance dose clopidogrel/Plavix. --Okay to hold Plavix 5 to 7 days preop for procedures.

## 2019-09-22 ENCOUNTER — Other Ambulatory Visit: Payer: Self-pay | Admitting: Family Medicine

## 2019-09-22 DIAGNOSIS — M8588 Other specified disorders of bone density and structure, other site: Secondary | ICD-10-CM

## 2019-09-29 ENCOUNTER — Other Ambulatory Visit: Payer: Self-pay | Admitting: Cardiology

## 2019-09-29 NOTE — Telephone Encounter (Signed)
Rx has been sent to the pharmacy electronically. ° °

## 2019-11-11 ENCOUNTER — Ambulatory Visit: Payer: Medicare Other

## 2019-11-20 ENCOUNTER — Ambulatory Visit: Payer: Medicare Other | Attending: Internal Medicine

## 2019-11-20 DIAGNOSIS — Z23 Encounter for immunization: Secondary | ICD-10-CM | POA: Insufficient documentation

## 2019-11-20 NOTE — Progress Notes (Signed)
   Covid-19 Vaccination Clinic  Name:  Samantha Mcbride    MRN: [id number] DOB: 08-10-1951  11/20/2019  Samantha Mcbride was observed post Covid-19 immunization for 15 minutes without incidence. She was provided with Vaccine Information Sheet and instruction to access the V-Safe system.   Samantha Mcbride was instructed to call 911 with any severe reactions post vaccine: Marland Kitchen Difficulty breathing  . Swelling of your face and throat  . A fast heartbeat  . A bad rash all over your body  . Dizziness and weakness    Immunizations Administered    Name Date Dose VIS Date Route   Pfizer COVID-19 Vaccine 11/20/2019  2:07 PM 0.3 mL 09/25/2019 Intramuscular   Manufacturer: [place]   Lot: [id number]   [place]: [address]-4

## 2019-11-29 ENCOUNTER — Other Ambulatory Visit: Payer: Self-pay | Admitting: Cardiology

## 2019-12-15 ENCOUNTER — Ambulatory Visit: Payer: Medicare Other | Attending: Internal Medicine

## 2019-12-15 DIAGNOSIS — Z23 Encounter for immunization: Secondary | ICD-10-CM | POA: Insufficient documentation

## 2019-12-15 NOTE — Progress Notes (Signed)
   Covid-19 Vaccination Clinic  Name:  EARLISHA SHARPLES    MRN: [id number] DOB: [birth year]  12/15/2019  Ms. Fusaro was observed post Covid-19 immunization for 15 minutes without incident. She was provided with Vaccine Information Sheet and instruction to access the V-Safe system.   Ms. Brickman was instructed to call 911 with any severe reactions post vaccine: Marland Kitchen Difficulty breathing  . Swelling of face and throat  . A fast heartbeat  . A bad rash all over body  . Dizziness and weakness   Immunizations Administered    Name Date Dose VIS Date Route   Pfizer COVID-19 Vaccine 12/15/2019  1:37 PM 0.3 mL 09/25/2019 Intramuscular   Manufacturer: [place]   Lot: [id number]   [place]: [id number]

## 2019-12-21 ENCOUNTER — Ambulatory Visit
Admission: RE | Admit: 2019-12-21 | Discharge: 2019-12-21 | Disposition: A | Discharge status: Home / Self Care | Payer: Medicare Other | Source: Ambulatory Visit | Attending: Family Medicine | Admitting: Family Medicine

## 2019-12-21 ENCOUNTER — Other Ambulatory Visit: Payer: Self-pay

## 2019-12-21 DIAGNOSIS — M8588 Other specified disorders of bone density and structure, other site: Secondary | ICD-10-CM

## 2020-04-15 ENCOUNTER — Other Ambulatory Visit: Payer: Self-pay | Admitting: Family Medicine

## 2020-04-15 DIAGNOSIS — Z1231 Encounter for screening mammogram for malignant neoplasm of breast: Secondary | ICD-10-CM

## 2020-05-05 ENCOUNTER — Ambulatory Visit
Admission: RE | Admit: 2020-05-05 | Discharge: 2020-05-05 | Disposition: A | Discharge status: Home / Self Care | Payer: Medicare Other | Source: Ambulatory Visit | Attending: Family Medicine | Admitting: Family Medicine

## 2020-05-05 ENCOUNTER — Other Ambulatory Visit: Payer: Self-pay

## 2020-05-05 DIAGNOSIS — Z1231 Encounter for screening mammogram for malignant neoplasm of breast: Secondary | ICD-10-CM

## 2020-05-09 ENCOUNTER — Other Ambulatory Visit: Payer: Self-pay | Admitting: Family Medicine

## 2020-05-09 DIAGNOSIS — R928 Other abnormal and inconclusive findings on diagnostic imaging of breast: Secondary | ICD-10-CM

## 2020-05-20 ENCOUNTER — Other Ambulatory Visit: Payer: Self-pay

## 2020-05-20 ENCOUNTER — Ambulatory Visit
Admission: RE | Admit: 2020-05-20 | Discharge: 2020-05-20 | Disposition: A | Discharge status: Home / Self Care | Payer: Medicare Other | Source: Ambulatory Visit | Attending: Family Medicine | Admitting: Family Medicine

## 2020-05-20 ENCOUNTER — Other Ambulatory Visit: Payer: Self-pay | Admitting: Family Medicine

## 2020-05-20 DIAGNOSIS — N6489 Other specified disorders of breast: Secondary | ICD-10-CM

## 2020-05-20 DIAGNOSIS — R928 Other abnormal and inconclusive findings on diagnostic imaging of breast: Secondary | ICD-10-CM

## 2020-05-20 DIAGNOSIS — N6310 Unspecified lump in the right breast, unspecified quadrant: Secondary | ICD-10-CM

## 2020-06-02 ENCOUNTER — Ambulatory Visit
Admission: RE | Admit: 2020-06-02 | Discharge: 2020-06-02 | Disposition: A | Discharge status: Home / Self Care | Payer: Medicare Other | Source: Ambulatory Visit | Attending: Family Medicine | Admitting: Family Medicine

## 2020-06-02 ENCOUNTER — Other Ambulatory Visit: Payer: Self-pay | Admitting: Family Medicine

## 2020-06-02 DIAGNOSIS — N6310 Unspecified lump in the right breast, unspecified quadrant: Secondary | ICD-10-CM

## 2020-06-02 DIAGNOSIS — N6489 Other specified disorders of breast: Secondary | ICD-10-CM

## 2020-06-09 ENCOUNTER — Telehealth: Payer: Self-pay | Admitting: *Deleted

## 2020-06-09 ENCOUNTER — Ambulatory Visit: Payer: Self-pay | Admitting: General Surgery

## 2020-06-09 DIAGNOSIS — C50211 Malignant neoplasm of upper-inner quadrant of right female breast: Secondary | ICD-10-CM

## 2020-06-09 DIAGNOSIS — Z170 Estrogen receptor positive status [ER+]: Secondary | ICD-10-CM

## 2020-06-09 NOTE — Telephone Encounter (Signed)
° °  [facility] Pre-operative Risk Assessment    HEARTCARE STAFF: - Please ensure there is not already an duplicate clearance open for this procedure. - Under Visit Info/Reason for Call, type in Other and utilize the format Clearance MM/DD/YY or Clearance TBD. Do not use dashes or single digits. - If request is for dental extraction, please clarify the # of teeth to be extracted.  Request for surgical clearance:  1. What type of surgery is being performed? lumpectomy   2. When is this surgery scheduled? TBD   3. What type of clearance is required (medical clearance vs. Pharmacy clearance to hold med vs. Both)? both  4. Are there any medications that need to be held prior to surgery and how long?plavix-need direction   5. Practice name and name of physician performing surgery? [facility]   6. What is the office phone number? [phone]   7.   What is the office fax number? [phone]  8.   Anesthesia type (None, local, MAC, general) ? general   Samantha Mcbride 06/09/2020, 3:46 PM  _________________________________________________________________   (provider comments below)

## 2020-06-10 NOTE — Telephone Encounter (Signed)
Primary Cardiologist:David Ellyn Hack, MD  Chart reviewed as part of pre-operative protocol coverage. Because of Samantha Mcbride's past medical history and time since last visit, he/she will require a follow-up visit in order to better assess preoperative cardiovascular risk.  Pre-op covering staff: - Please schedule appointment and call patient to inform them. - Please contact requesting surgeon's office via preferred method (i.e, phone, fax) to inform them of need for appointment prior to surgery.  If applicable, this message will also be routed to pharmacy pool and/or primary cardiologist for input on holding anticoagulant/antiplatelet agent as requested below so that this information is available at time of patient's appointment.   Deberah Pelton, NP  06/10/2020, 8:00 AM

## 2020-06-10 NOTE — Telephone Encounter (Signed)
Appt made on 09/27 @ 2:40pm with Dr Ellyn Hack

## 2020-06-10 NOTE — Telephone Encounter (Signed)
Send to scheduler to schedule an office visit appt

## 2020-06-14 ENCOUNTER — Telehealth: Payer: Self-pay | Admitting: Hematology and Oncology

## 2020-06-14 ENCOUNTER — Ambulatory Visit: Payer: Medicare Other | Attending: Internal Medicine

## 2020-06-14 DIAGNOSIS — Z23 Encounter for immunization: Secondary | ICD-10-CM

## 2020-06-14 NOTE — Progress Notes (Signed)
Cholectomy or if she is symptomatic and mean I think it is reasonable to bring her in for blood transfusion to [place] NOTE  Patient Care Team: Antony Contras, MD as PCP - General (Family Medicine) Leonie Man, MD as PCP - Cardiology (Cardiology)  CHIEF COMPLAINTS/PURPOSE OF CONSULTATION:  Newly diagnosed breast cancer  HISTORY OF PRESENTING ILLNESS:  Samantha Mcbride 69 y.o. female is here because of recent diagnosis of invasive ductal carcinoma of the right breast. Screening mammogram on 05/05/20 showed a right breast asymmetry. Diagnostic mammogram and [place] on 05/20/20 showed a 0.6cm asymmetry in the lateral right breast and a 0.5cm mass at the 12:30 position. Biopsy on 06/02/20 showed invasive ductal carcinoma, grade 1, HER-2 equivocal by IHC, negative by FISH, ER+ 95%, PR+ 95%, Ki67 10%. She presents to the clinic today for initial evaluation and discussion of treatment options.   I reviewed her records extensively and collaborated the history with the patient.  SUMMARY OF ONCOLOGIC HISTORY: Oncology History  Malignant neoplasm of upper-outer quadrant of right breast in female, estrogen receptor positive ([place])  06/02/2020 Initial Diagnosis   05/20/20 showed a 0.6cm asymmetry in the lateral right breast and a 0.5cm mass at the 12:30 position. Biopsy on 06/02/20 showed invasive ductal carcinoma, grade 1, HER-2 equivocal by IHC, negative by FISH, ER+ 95%, PR+ 95%, Ki67 10%     MEDICAL HISTORY:  Past Medical History:  Diagnosis Date  . Anemia    iron deficiency anemia   . CAD S/P percutaneous coronary angioplasty November 2012   Promus DES - mid LAD 3.5 mm x 24 mm (3.72 mm)   . Complication of anesthesia   . Dyslipidemia, goal LDL below 70     on statin, close to goal  . Former moderate cigarette smoker (10-19 per day)   . Glucose intolerance (impaired glucose tolerance)   . H/O thyroid nodule    Benign  . History of ST elevation myocardial infarction  (STEMI) of anterior wall November 2012   With cardiac arrest, 100% mobility occlusion. --> Promus DES   . Hypertension   . PONV (postoperative nausea and vomiting)    no issues with surgery on 05-11-2019  . Rheumatoid arthritis ([place])    managed on humira   . SOB (shortness of breath) on exertion    reports "its been that way since my heart attack " repots no recurrence of MI sx since that time     SURGICAL HISTORY: Past Surgical History:  Procedure Laterality Date  . ABDOMINAL AORTAGRAM N/A 08/27/2011   Procedure: ABDOMINAL Maxcine Ham;  Surgeon: Troy Sine, MD;  Location: [facility] CATH LAB;  Service: Cardiovascular;  Laterality: N/A;  . BREAST EXCISIONAL BIOPSY Left   . CARDIAC CATHETERIZATION  November 2012   For anterior STEMI/cardiac arrest -- 100% mid LAD occlusion  . CORONARY ANGIOPLASTY WITH STENT PLACEMENT  November 2012    mid LAD PCI --> Promus Element DES 3.5 mm at 24 mm (3.72 mm)  . CYSTOSCOPY WITH RETROGRADE PYELOGRAM, URETEROSCOPY AND STENT PLACEMENT Bilateral 05/11/2019   Procedure: CYSTOSCOPY WITH RETROGRADE PYELOGRAM,  AND STENT PLACEMENT;  Surgeon: Lucas Mallow, MD;  Location: [facility] ORS;  Service: Urology;  Laterality: Bilateral;  . CYSTOSCOPY/URETEROSCOPY/HOLMIUM LASER/STENT PLACEMENT Bilateral 05/25/2019   Procedure: CYSTOSCOPY BILATERAL URETEROSCOPY/HOLMIUM LASER/STENT PLACEMENT;  Surgeon: Lucas Mallow, MD;  Location: [facility] ORS;  Service: Urology;  Laterality: Bilateral;  . LEFT HEART CATHETERIZATION WITH CORONARY ANGIOGRAM N/A 08/27/2011   Procedure: LEFT HEART  CATHETERIZATION WITH CORONARY ANGIOGRAM;  Surgeon: Troy Sine, MD;  Location: [facility] C[facility] LAB;  Service: Cardiovascular;  Laterality: N/A;  . PERCUTANEOUS CORONARY STENT INTERVENTION (PCI-S) N/A 08/27/2011   Procedure: PERCUTANEOUS CORONARY STENT INTERVENTION (PCI-S);  Surgeon: Troy Sine, MD;  Location: [facility] CATH LAB;  Service: Cardiovascular;  Laterality: N/A;  . TRANSTHORACIC ECHOCARDIOGRAM  November 2012    EF 40-45%, moderate at K. of mid and distal inferior septum and anterior apical myocardium. Grade 1 diastolic function. -- Followup echocardiogram to reassess his EF was denied by insurance company    SOCIAL HISTORY: Social History   Socioeconomic History  . Marital status: Single    Spouse name: Not on file  . Number of children: Not on file  . Years of education: Not on file  . Highest education level: Not on file  Occupational History  . Not on file  Tobacco Use  . Smoking status: Former Smoker    Packs/day: 1.00    Years: 40.00    Pack years: 40.00    Types: Cigarettes    Quit date: 08/27/2011    Years since quitting: 8.8  . Smokeless tobacco: Never Used  Substance and Sexual Activity  . Alcohol use: No  . Drug use: No  . Sexual activity: Not on file  Other Topics Concern  . Not on file  Social History Narrative   Single mother of one, grandmother of 2. She works for [employer] for [employer].   She quit smoking time of her MI in November 2012. Does not take alcohol.   She had been doing really well he exercises at least 3-4 days a week, but really get discouraged that she is not been able to drop weight.   Social Determinants of Health   Financial Resource Strain:   . Difficulty of Paying Living Expenses: Not on file  Food Insecurity:   . Worried About Charity fundraiser in the Last Year: Not on file  . Ran Out of Food in the Last Year: Not on file  Transportation Needs:   . Lack of Transportation (Medical): Not on file  . Lack of Transportation (Non-Medical): Not on file  Physical Activity:   . Days of Exercise per Week: Not on file  . Minutes of Exercise per Session: Not on file  Stress:   . Feeling of Stress : Not on file  Social Connections:   . Frequency of Communication with Friends and Family: Not on file  . Frequency of Social Gatherings with Friends and Family: Not on file  . Attends Religious Services: Not on file  . Active  Member of Clubs or Organizations: Not on file  . Attends Archivist Meetings: Not on file  . Marital Status: Not on file  Intimate Partner Violence:   . Fear of Current or Ex-Partner: Not on file  . Emotionally Abused: Not on file  . Physically Abused: Not on file  . Sexually Abused: Not on file    FAMILY HISTORY: Family History  Problem Relation Age of Onset  . Cancer Mother   . Hypertension Mother   . Breast cancer Mother        early 68's  . Heart failure Father   . Breast cancer Sister        unsure of age    ALLERGIES:  has No Known Allergies.  MEDICATIONS:  Current Outpatient Medications  Medication Sig Dispense Refill  . Adalimumab (HUMIRA PEN) 40 MG/0.4ML PNKT Inject 40  mg into the skin every 14 (fourteen) days.     Marland Kitchen atorvastatin (LIPITOR) 40 MG tablet TAKE 1 TABLET BY MOUTH AT BEDTIME 90 tablet 2  . carvedilol (COREG) 12.5 MG tablet Take 12.5 mg by mouth 2 (two) times daily.    . Cholecalciferol (VITAMIN D3) 125 MCG (5000 UT) TABS Take 1 tablet by mouth daily.    . clopidogrel (PLAVIX) 75 MG tablet TAKE 1 TABLET BY MOUTH DAILY 90 tablet 3  . diclofenac sodium (VOLTAREN) 1 % GEL Apply 2 g topically daily as needed (pain).    . hydroxychloroquine (PLAQUENIL) 200 MG tablet Take 1 tablet (200 mg total) by mouth daily.    . nitroGLYCERIN (NITROSTAT) 0.4 MG SL tablet PLACE 1 TABLET UNDER THE TONGUE EVERY 5 MINUTES AS NEEDED FOR CHEST PAIN. 25 tablet 4  . tiZANidine (ZANAFLEX) 2 MG tablet Take 2 mg by mouth as needed for muscle spasms.    . traZODone (DESYREL) 100 MG tablet Take by mouth at bedtime. Dose unknown      No current facility-administered medications for this visit.    REVIEW OF SYSTEMS:     All other systems were reviewed with the patient and are negative.  PHYSICAL EXAMINATION: ECOG PERFORMANCE STATUS: 1 - Symptomatic but completely ambulatory  Vitals:   06/15/20 1558  BP: (!) 143/84  Pulse: 81  Resp: 17  Temp: 98.6 F (37 C)  SpO2: 97%    Filed Weights   06/15/20 1558  Weight: 195 lb (88.5 kg)       LABORATORY DATA:  I have reviewed the data as listed Lab Results  Component Value Date   WBC 9.9 05/22/2019   HGB 9.6 (L) 05/22/2019   HCT 31.0 (L) 05/22/2019   MCV 100.3 (H) 05/22/2019   PLT 251 05/22/2019   Lab Results  Component Value Date   NA 142 05/22/2019   K 3.7 05/22/2019   CL 106 05/22/2019   CO2 28 05/22/2019    RADIOGRAPHIC STUDIES: I have personally reviewed the radiological reports and agreed with the findings in the report.  ASSESSMENT AND PLAN:  Malignant neoplasm of upper-outer quadrant of right breast in female, estrogen receptor positive ([place]) 05/20/20 showed a 0.6cm asymmetry in the lateral right breast and a 0.5cm mass at the 12:30 position. Biopsy on 06/02/20 showed invasive ductal carcinoma, grade 1, HER-2 equivocal by IHC, negative by FISH, ER+ 95%, PR+ 95%, Ki67 10%  Pathology and radiology counseling: Discussed with the patient, the details of pathology including the type of breast cancer,the clinical staging, the significance of ER, PR and HER-2/neu receptors and the implications for treatment. After reviewing the pathology in detail, we proceeded to discuss the different treatment options between surgery, radiation, and  antiestrogen therapies.  Treatment plan: 1.  Genetics consult (her mother and sister had breast cancers) 2. breast conserving surgery with sentinel lymph node biopsy 3.  Adjuvant radiation therapy 4.  Followed by adjuvant antiestrogen therapy with anastrozole 1 mg daily x5 years  Return to clinic after surgery to discuss the final pathology report and finalize adjuvant treatment plan. Patient has chronic kidney disease, rheumatoid arthritis and history of coronary artery disease.     All questions were answered. The patient knows to call the clinic with any problems, questions or concerns.   Rulon Eisenmenger, MD, MPH 06/15/2020    I, Molly Dorshimer, am acting as  scribe for Nicholas Lose, MD.  I have reviewed the above documentation for accuracy and completeness, and I  agree with the above.

## 2020-06-14 NOTE — Telephone Encounter (Signed)
Received a new pt referral from Dr. Marlou Starks for a new dx of breast cancer. Samantha Mcbride returned my call and has been scheduled to see Dr. Lindi Adie on 9/1 at 415pm. Pt aware to arrive 15 minutes early

## 2020-06-14 NOTE — Progress Notes (Signed)
   Covid-19 Vaccination Clinic  Name:  Chasity Outten    MRN: [id number] DOB: September 29, 1951  06/14/2020  Ms. Schrodt was observed post Covid-19 immunization for 15 minutes without incident. She was provided with Vaccine Information Sheet and instruction to access the V-Safe system.   Ms. Huffine was instructed to call 911 with any severe reactions post vaccine: Marland Kitchen Difficulty breathing  . Swelling of face and throat  . A fast heartbeat  . A bad rash all over body  . Dizziness and weakness

## 2020-06-15 ENCOUNTER — Encounter: Payer: Self-pay | Admitting: *Deleted

## 2020-06-15 ENCOUNTER — Other Ambulatory Visit: Payer: Self-pay

## 2020-06-15 ENCOUNTER — Inpatient Hospital Stay: Payer: Medicare Other | Attending: Hematology and Oncology | Admitting: Hematology and Oncology

## 2020-06-15 DIAGNOSIS — Z170 Estrogen receptor positive status [ER+]: Secondary | ICD-10-CM | POA: Diagnosis not present

## 2020-06-15 DIAGNOSIS — Z79899 Other long term (current) drug therapy: Secondary | ICD-10-CM | POA: Insufficient documentation

## 2020-06-15 DIAGNOSIS — M069 Rheumatoid arthritis, unspecified: Secondary | ICD-10-CM | POA: Diagnosis not present

## 2020-06-15 DIAGNOSIS — C50411 Malignant neoplasm of upper-outer quadrant of right female breast: Secondary | ICD-10-CM | POA: Diagnosis not present

## 2020-06-15 MED ORDER — HYDROXYCHLOROQUINE SULFATE 200 MG PO TABS: 200.0000 mg | ORAL_TABLET | Freq: Every day | ORAL | Status: AC

## 2020-06-15 NOTE — Assessment & Plan Note (Signed)
05/20/20 showed a 0.6cm asymmetry in the lateral right breast and a 0.5cm mass at the 12:30 position. Biopsy on 06/02/20 showed invasive ductal carcinoma, grade 1, HER-2 equivocal by IHC, negative by FISH, ER+ 95%, PR+ 95%, Ki67 10%  Pathology and radiology counseling: Discussed with the patient, the details of pathology including the type of breast cancer,the clinical staging, the significance of ER, PR and HER-2/neu receptors and the implications for treatment. After reviewing the pathology in detail, we proceeded to discuss the different treatment options between surgery, radiation, and  antiestrogen therapies.  Treatment plan: 1.  Genetics consult (her mother and sister had breast cancers) 2. breast conserving surgery with sentinel lymph node biopsy 3.  Adjuvant radiation therapy 4.  Followed by adjuvant antiestrogen therapy with anastrozole 1 mg daily x5 years  Return to clinic after surgery to discuss the final pathology report and finalize adjuvant treatment plan. Patient has chronic kidney disease, rheumatoid arthritis and history of coronary artery disease.

## 2020-06-15 NOTE — Progress Notes (Signed)
Location of Breast Cancer: Malignant neoplasm of UOQ Right Breast  Did patient present with symptoms (if so, please note symptoms) or was this found on screening mammography?: Routine Mammogram  Mammogram: 5 mm area of asymmetry in the inner aspect of the right breast with clinically negative looking nodes.  Histology per Pathology Report: Right Breast 06/02/2020  Receptor Status: ER(+ 95%), PR (+ 95%), Her2-neu (-), Ki-67(10%)    Past/Anticipated interventions by surgeon, if any: Dr. Marlou Starks 06/09/2020 - The patient appears to have a very small stage I cancer of the upper inner quadrant of the right breast with clinically negative nodes. -She favors breast conservation which I feel is a very reasonable way of treating her cancer. -She is also a good candidate for sentinel node mapping. -I will refer her to medical and radiation oncology to discuss adjuvant therapy.  Past/Anticipated interventions by medical oncology, if any: Chemotherapy  Dr. Lindi Adie 06/15/2020 -Treatment plan: 1.  Genetics consult (her mother and sister had breast cancers) 2. breast conserving surgery with sentinel lymph node biopsy 3.  Adjuvant radiation therapy 4.  Followed by adjuvant antiestrogen therapy with anastrozole 1 mg daily x5 years  Lymphedema issues, if any:  No  Pain issues, if any:  No  SAFETY ISSUES:  Prior radiation? No  Pacemaker/ICD? No  Possible current pregnancy? Postmenopausal  Is the patient on methotrexate? No  Current Complaints / other details:      Samantha Razor, RN 06/15/2020,11:40 AM

## 2020-06-16 ENCOUNTER — Ambulatory Visit
Admission: RE | Admit: 2020-06-16 | Discharge: 2020-06-16 | Disposition: A | Discharge status: Home / Self Care | Payer: Medicare Other | Source: Ambulatory Visit | Attending: Radiation Oncology | Admitting: Radiation Oncology

## 2020-06-16 ENCOUNTER — Encounter: Payer: Self-pay | Admitting: Radiation Oncology

## 2020-06-16 VITALS — BP 132/73 | HR 77 | Temp 97.8°F | Resp 20 | Ht 61.0 in | Wt 195.8 lb

## 2020-06-16 DIAGNOSIS — I252 Old myocardial infarction: Secondary | ICD-10-CM | POA: Insufficient documentation

## 2020-06-16 DIAGNOSIS — I10 Essential (primary) hypertension: Secondary | ICD-10-CM | POA: Insufficient documentation

## 2020-06-16 DIAGNOSIS — Z809 Family history of malignant neoplasm, unspecified: Secondary | ICD-10-CM

## 2020-06-16 DIAGNOSIS — I2510 Atherosclerotic heart disease of native coronary artery without angina pectoris: Secondary | ICD-10-CM | POA: Insufficient documentation

## 2020-06-16 DIAGNOSIS — C50411 Malignant neoplasm of upper-outer quadrant of right female breast: Secondary | ICD-10-CM

## 2020-06-16 DIAGNOSIS — Z803 Family history of malignant neoplasm of breast: Secondary | ICD-10-CM | POA: Diagnosis not present

## 2020-06-16 DIAGNOSIS — Z79899 Other long term (current) drug therapy: Secondary | ICD-10-CM | POA: Diagnosis not present

## 2020-06-16 DIAGNOSIS — M069 Rheumatoid arthritis, unspecified: Secondary | ICD-10-CM | POA: Insufficient documentation

## 2020-06-16 DIAGNOSIS — E785 Hyperlipidemia, unspecified: Secondary | ICD-10-CM | POA: Insufficient documentation

## 2020-06-16 DIAGNOSIS — Z170 Estrogen receptor positive status [ER+]: Secondary | ICD-10-CM | POA: Insufficient documentation

## 2020-06-16 DIAGNOSIS — Z87891 Personal history of nicotine dependence: Secondary | ICD-10-CM | POA: Diagnosis not present

## 2020-06-16 DIAGNOSIS — D509 Iron deficiency anemia, unspecified: Secondary | ICD-10-CM | POA: Diagnosis not present

## 2020-06-16 NOTE — Progress Notes (Signed)
Radiation Oncology         [phone] ________________________________  Name: Samantha Mcbride        MRN: [id number]  Date of Service: 06/16/2020 DOB: 07-May-1951  XN:[id number], [name], MD  [name], MD     REFERRING PHYSICIAN: [name], MD   DIAGNOSIS: The encounter diagnosis was Malignant neoplasm of upper-outer quadrant of right breast in female, estrogen receptor positive ([place]).   HISTORY OF PRESENT ILLNESS: Samantha Mcbride is a 69 y.o. female with a new diagnosis of Right breast cancer.  The patient was found on screening mammogram to have a mass in the right breast at approximately 1230.  Further diagnostic imaging measured this area is 5 x 4 x 2 mm, and her axilla was negative for adenopathy.  She underwent a biopsy on 06/02/20 that revealed a grade 1 invasive ductal carcinoma, her tumor was ER/PR positive, HER-2 was negative and Ki-67 was 10%.  Her case was discussed in multidisciplinary breast oncology conference, and has been recommended that she proceed with lumpectomy and sentinel node, and she is contacted today to review the rationale for adjuvant radiotherapy.   PREVIOUS RADIATION THERAPY: No   PAST MEDICAL HISTORY:  Past Medical History:  Diagnosis Date  . Anemia    iron deficiency anemia   . CAD S/P percutaneous coronary angioplasty November 2012   Promus DES - mid LAD 3.5 mm x 24 mm (3.72 mm)   . Complication of anesthesia   . Dyslipidemia, goal LDL below 70     on statin, close to goal  . Former moderate cigarette smoker (10-19 per day)   . Glucose intolerance (impaired glucose tolerance)   . H/O thyroid nodule    Benign  . History of ST elevation myocardial infarction (STEMI) of anterior wall November 2012   With cardiac arrest, 100% mobility occlusion. --> Promus DES   . Hypertension   . PONV (postoperative nausea and vomiting)    no issues with surgery on 05-11-2019  . Rheumatoid arthritis ([place])    managed on humira   . SOB (shortness of breath) on  exertion    reports "its been that way since my heart attack " repots no recurrence of MI sx since that time        PAST SURGICAL HISTORY: Past Surgical History:  Procedure Laterality Date  . ABDOMINAL AORTAGRAM N/A 08/27/2011   Procedure: ABDOMINAL Maxcine Ham;  Surgeon: Troy Sine, MD;  Location: [facility] CATH LAB;  Service: Cardiovascular;  Laterality: N/A;  . BREAST EXCISIONAL BIOPSY Left   . CARDIAC CATHETERIZATION  November 2012   For anterior STEMI/cardiac arrest -- 100% mid LAD occlusion  . CORONARY ANGIOPLASTY WITH STENT PLACEMENT  November 2012    mid LAD PCI --> Promus Element DES 3.5 mm at 24 mm (3.72 mm)  . CYSTOSCOPY WITH RETROGRADE PYELOGRAM, URETEROSCOPY AND STENT PLACEMENT Bilateral 05/11/2019   Procedure: CYSTOSCOPY WITH RETROGRADE PYELOGRAM,  AND STENT PLACEMENT;  Surgeon: Lucas Mallow, MD;  Location: [facility] ORS;  Service: Urology;  Laterality: Bilateral;  . CYSTOSCOPY/URETEROSCOPY/HOLMIUM LASER/STENT PLACEMENT Bilateral 05/25/2019   Procedure: CYSTOSCOPY BILATERAL URETEROSCOPY/HOLMIUM LASER/STENT PLACEMENT;  Surgeon: Lucas Mallow, MD;  Location: [facility] ORS;  Service: Urology;  Laterality: Bilateral;  . LEFT HEART CATHETERIZATION WITH CORONARY ANGIOGRAM N/A 08/27/2011   Procedure: LEFT HEART CATHETERIZATION WITH CORONARY ANGIOGRAM;  Surgeon: Troy Sine, MD;  Location: [facility] CATH LAB;  Service: Cardiovascular;  Laterality: N/A;  . PERCUTANEOUS CORONARY STENT INTERVENTION (PCI-S) N/A  08/27/2011   Procedure: PERCUTANEOUS CORONARY STENT INTERVENTION (PCI-S);  Surgeon: Troy Sine, MD;  Location[facility] C[facility] LAB;  Service: Cardiovascular;  Laterality: N/A;  . TRANSTHORACIC ECHOCARDIOGRAM  November 2012   EF 40-45%, moderate at K. of mid and distal inferior septum and anterior apical myocardium. Grade 1 diastolic function. -- Followup echocardiogram to reassess his EF was denied by insurance company     FAMILY HISTORY:  Family History  Problem Relation Age of Onset  . Cancer  Mother   . Hypertension Mother   . Breast cancer Mother        early 26's  . Heart failure Father   . Breast cancer Sister        unsure of age     SOCIAL HISTORY:  reports that she quit smoking about 8 years ago. Her smoking use included cigarettes. She has a 40.00 pack-year smoking history. She has never used smokeless tobacco. She reports that she does not drink alcohol and does not use drugs. The patient is single and lives in [place]. Her daughter accompanies her today.  ALLERGIES: Patient has no known allergies.   MEDICATIONS:  Current Outpatient Medications  Medication Sig Dispense Refill  . Adalimumab (HUMIRA PEN) 40 MG/0.4ML PNKT Inject 40 mg into the skin every 14 (fourteen) days.     Marland Kitchen atorvastatin (LIPITOR) 40 MG tablet TAKE 1 TABLET BY MOUTH AT BEDTIME 90 tablet 2  . carvedilol (COREG) 12.5 MG tablet Take 12.5 mg by mouth 2 (two) times daily.    . Cholecalciferol (VITAMIN D3) 125 MCG (5000 UT) TABS Take 1 tablet by mouth daily.    . clopidogrel (PLAVIX) 75 MG tablet TAKE 1 TABLET BY MOUTH DAILY 90 tablet 3  . diclofenac sodium (VOLTAREN) 1 % GEL Apply 2 g topically daily as needed (pain).    . hydroxychloroquine (PLAQUENIL) 200 MG tablet Take 1 tablet (200 mg total) by mouth daily.    . nitroGLYCERIN (NITROSTAT) 0.4 MG SL tablet PLACE 1 TABLET UNDER THE TONGUE EVERY 5 MINUTES AS NEEDED FOR CHEST PAIN. 25 tablet 4  . tiZANidine (ZANAFLEX) 2 MG tablet Take 2 mg by mouth as needed for muscle spasms.    . traZODone (DESYREL) 100 MG tablet Take by mouth at bedtime. Dose unknown      No current facility-administered medications for this encounter.     REVIEW OF SYSTEMS: On review of systems, the patient reports that she is doing well overall. She denies any chest pain, shortness of breath, cough, fevers, chills, night sweats, unintended weight changes. She denies any bowel or bladder disturbances, and denies abdominal pain, nausea or vomiting. She denies any new  musculoskeletal or joint aches or pains. A complete review of systems is obtained and is otherwise negative.     PHYSICAL EXAM:  Wt Readings from Last 3 Encounters:  06/16/20 195 lb 12.8 oz (88.8 kg)  06/15/20 195 lb (88.5 kg)  09/01/19 190 lb 9.6 oz (86.5 kg)   Temp Readings from Last 3 Encounters:  06/16/20 97.8 F (36.6 C)  06/15/20 98.6 F (37 C) (Tympanic)  09/01/19 (!) 96.9 F (36.1 C)   BP Readings from Last 3 Encounters:  06/16/20 132/73  06/15/20 (!) 143/84  09/01/19 136/87   Pulse Readings from Last 3 Encounters:  06/16/20 77  06/15/20 81  09/01/19 68    In general this is a well appearing caucasian female in no acute distress. She's alert and oriented x4 and appropriate throughout the examination. Cardiopulmonary assessment  is negative for acute distress and she exhibits normal effort. Bilateral breast exam is deferred.    ECOG = 0  0 - Asymptomatic (Fully active, able to carry on all predisease activities without restriction)  1 - Symptomatic but completely ambulatory (Restricted in physically strenuous activity but ambulatory and able to carry out work of a light or sedentary nature. For example, light housework, office work)  2 - Symptomatic, <50% in bed during the day (Ambulatory and capable of all self care but unable to carry out any work activities. Up and about more than 50% of waking hours)  3 - Symptomatic, >50% in bed, but not bedbound (Capable of only limited self-care, confined to bed or chair 50% or more of waking hours)  4 - Bedbound (Completely disabled. Cannot carry on any self-care. Totally confined to bed or chair)  5 - Death   Eustace Pen MM, Creech RH, Tormey DC, et al. [phone]). "Toxicity and response criteria of the [facility]". [place] Oncol. 5 (6): 649-55    LABORATORY DATA:  Lab Results  Component Value Date   WBC 9.9 05/22/2019   HGB 9.6 (L) 05/22/2019   HCT 31.0 (L) 05/22/2019   MCV 100.3 (H)  05/22/2019   PLT 251 05/22/2019   Lab Results  Component Value Date   NA 142 05/22/2019   K 3.7 05/22/2019   CL 106 05/22/2019   CO2 28 05/22/2019   Lab Results  Component Value Date   ALT 15 05/11/2019   AST 16 05/11/2019   ALKPHOS 63 05/11/2019   BILITOT 1.0 05/11/2019      RADIOGRAPHY: US BREAST LTD UNI RIGHT INC AXILLA  Result Date: 05/20/2020 CLINICAL DATA:  Screening recall for a right breast asymmetry. The patient has family history of breast cancer in her mother and her sister EXAM: DIGITAL DIAGNOSTIC UNILATERAL RIGHT MAMMOGRAM WITH TOMO AND CAD; ULTRASOUND RIGHT BREAST LIMITED COMPARISON:  Previous exam(s). ACR Breast Density Category b: There are scattered areas of fibroglandular density. FINDINGS: Spot compression tomosynthesis images through the lateral mid to anterior depth of the right breast demonstrates a persistent low-density asymmetry approximately 4.6 cm from the nipple. The asymmetry measures 0.6 cm. No definite corresponding lesion is seen on the lateral views. There is a small calcified mass with possible subtle distortion in the superior right breast, middle depth. Mammographic images were processed with CAD. Ultrasound targeted to the lateral aspect of the right breast demonstrates normal fibroglandular tissue. No suspicious masses or areas of shadowing are identified. Ultrasound of the right breast at 12:30, 4 cm from the nipple demonstrates a hypoechoic shadowing mass measuring 0.5 x 0.2 x 0.4 cm. Ultrasound of the right axilla demonstrates multiple normal-appearing lymph nodes. IMPRESSION: 1. There is an indeterminate asymmetry in the lateral right breast without a sonographic correlate. 2. There is a suspicious 0.5 cm shadowing mass in the right breast at 12:30. 3.  No evidence of right axillary lymphadenopathy. RECOMMENDATION: Ultrasound guided biopsy is recommended for the 0.5 cm mass in the right breast at 12:30. Stereotactic biopsy is recommended for the  indeterminate asymmetry in the lateral right breast. These procedures have been scheduled for 06/02/2020 at 730 a.m. I have discussed the findings and recommendations with the patient. If applicable, a reminder letter will be sent to the patient regarding the next appointment. BI-RADS CATEGORY  4: Suspicious. Electronically Signed   By: Ammie Ferrier M.D.   On: 05/20/2020 11:42   MM DIAG BREAST TOMO UNI RIGHT  Result Date: 06/02/2020 CLINICAL DATA:  69 year old female scheduled for a stereotactic biopsy of the right breast for an asymmetry. EXAM: DIGITAL DIAGNOSTIC UNILATERAL RIGHT MAMMOGRAM WITH TOMO AND CAD COMPARISON:  Previous exam(s). ACR Breast Density Category b: There are scattered areas of fibroglandular density. FINDINGS: The asymmetry previously seen in the lateral middle depth of the right breast resolves on the preprocedure mammogram images. A repeat full paddle CC image was performed, also demonstrating resolution of the asymmetry in question. Mammographic images were processed with CAD. IMPRESSION: Resolution of the asymmetry in the lateral right breast. RECOMMENDATION: 1. Six-month follow-up diagnostic right breast mammogram due to the canceled biopsy. 2. Additional recommendations will be based on pathology results from the ultrasound-guided biopsy which was performed earlier today, and dictated in a separate report. I have discussed the findings and recommendations with the patient. If applicable, a reminder letter will be sent to the patient regarding the next appointment. BI-RADS CATEGORY  1: Negative. Electronically Signed   By: Ammie Ferrier M.D.   On: 06/02/2020 12:31   MM DIAG BREAST TOMO UNI RIGHT  Result Date: 05/20/2020 CLINICAL DATA:  Screening recall for a right breast asymmetry. The patient has family history of breast cancer in her mother and her sister EXAM: DIGITAL DIAGNOSTIC UNILATERAL RIGHT MAMMOGRAM WITH TOMO AND CAD; ULTRASOUND RIGHT BREAST LIMITED COMPARISON:   Previous exam(s). ACR Breast Density Category b: There are scattered areas of fibroglandular density. FINDINGS: Spot compression tomosynthesis images through the lateral mid to anterior depth of the right breast demonstrates a persistent low-density asymmetry approximately 4.6 cm from the nipple. The asymmetry measures 0.6 cm. No definite corresponding lesion is seen on the lateral views. There is a small calcified mass with possible subtle distortion in the superior right breast, middle depth. Mammographic images were processed with CAD. Ultrasound targeted to the lateral aspect of the right breast demonstrates normal fibroglandular tissue. No suspicious masses or areas of shadowing are identified. Ultrasound of the right breast at 12:30, 4 cm from the nipple demonstrates a hypoechoic shadowing mass measuring 0.5 x 0.2 x 0.4 cm. Ultrasound of the right axilla demonstrates multiple normal-appearing lymph nodes. IMPRESSION: 1. There is an indeterminate asymmetry in the lateral right breast without a sonographic correlate. 2. There is a suspicious 0.5 cm shadowing mass in the right breast at 12:30. 3.  No evidence of right axillary lymphadenopathy. RECOMMENDATION: Ultrasound guided biopsy is recommended for the 0.5 cm mass in the right breast at 12:30. Stereotactic biopsy is recommended for the indeterminate asymmetry in the lateral right breast. These procedures have been scheduled for 06/02/2020 at 730 a.m. I have discussed the findings and recommendations with the patient. If applicable, a reminder letter will be sent to the patient regarding the next appointment. BI-RADS CATEGORY  4: Suspicious. Electronically Signed   By: Ammie Ferrier M.D.   On: 05/20/2020 11:42   [place] RT BREAST BX W LOC DEV 1ST LESION IMG BX SPEC US GUIDE  Addendum Date: 06/03/2020   ADDENDUM REPORT: 06/03/2020 12:06 ADDENDUM: Pathology revealed GRADE I INVASIVE DUCTAL CARCINOMA of the RIGHT breast, 12:30 o'clock. This was found to be  concordant by Dr. Ammie Ferrier. Pathology results were discussed with the patient by telephone. The patient reported doing well after the biopsy with tenderness at the site. Post biopsy instructions and care were reviewed and questions were answered. The patient was encouraged to call [facility] for any additional concerns. Surgical consultation has been arranged with Dr. Autumn Messing  at [facility] on June 09, 2020. Additional recommendation: 6 month follow-up mammogram of the right breast due to canceled stereotactic biopsy of the right breast. Alternatively, a breast MRI may be performed prior to lumpectomy. Pathology results reported by Stacie Acres RN on 06/03/2020. Electronically Signed   By: Ammie Ferrier M.D.   On: 06/03/2020 12:06   Result Date: 06/03/2020 CLINICAL DATA:  69 year old female presenting for ultrasound-guided biopsy of a right breast mass. EXAM: ULTRASOUND GUIDED RIGHT BREAST CORE NEEDLE BIOPSY COMPARISON:  Previous exam(s). PROCEDURE: I met with the patient and we discussed the procedure of ultrasound-guided biopsy, including benefits and alternatives. We discussed the high likelihood of a successful procedure. We discussed the risks of the procedure, including infection, bleeding, tissue injury, clip migration, and inadequate sampling. Informed written consent was given. The usual time-out protocol was performed immediately prior to the procedure. Lesion quadrant: Upper inner quadrant Using sterile technique and 1% Lidocaine as local anesthetic, under direct ultrasound visualization, a 14 gauge spring-loaded device was used to perform biopsy of a mass in the right breast at 12:30 using an inferior approach. At the conclusion of the procedure ribbon shaped tissue marker clip was deployed into the biopsy cavity. Follow up 2 view mammogram was performed and dictated separately. IMPRESSION: Ultrasound guided biopsy of a mass in the upper inner  right breast. No apparent complications. Electronically Signed: By: Ammie Ferrier M.D. On: 06/02/2020 11:04       IMPRESSION/PLAN: 1.  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 Dr. Lisbeth Renshaw discusses the pathology findings and reviews the nature of early stage right breast disease. The consensus from the breast conference includes breast conservation with lumpectomy with sentinel node biopsy. To reduce the risks of local recurrence, she would benefit from external radiotherapy to the breast followed by antiestrogen therapy. We discussed the risks, benefits, short, and long term effects of radiotherapy, and the patient is interested in proceeding. Dr. Lisbeth Renshaw discusses the delivery and logistics of radiotherapy and anticipates a course of 4 or 6 1/2 weeks of radiotherapy, 4 based on current work up. We will see her back a few weeks after surgery to discuss the simulation process and anticipate we starting radiotherapy about 4-6 weeks after surgery.  2.  Possible genetic predisposition to malignancy. The patient is a candidate for genetic testing given her personal and family history. She was offered referral by Dr. Lindi Adie and will see them within a week. 3. Rheumatoid Arthritis. The patient's provider Dr. Jenetta Downer in Rheumatology will follow along with the patient, but she is not on methotrexate and Humira and Plaquenil are not prohibitive for radiotherapy.   In a visit lasting 60 minutes, greater than 50% of the time was spent face to face reviewing her case, as well as in preparation of, discussing, and coordinating the patient's care.  The above documentation reflects my direct findings during this shared patient visit. Please see the separate note by Dr. Lisbeth Renshaw on this date for the remainder of the patient's plan of care.    Carola Rhine, PAC

## 2020-06-28 ENCOUNTER — Encounter: Payer: Self-pay | Admitting: *Deleted

## 2020-06-30 ENCOUNTER — Encounter: Payer: Self-pay | Admitting: Rehabilitation

## 2020-06-30 ENCOUNTER — Ambulatory Visit: Payer: Medicare Other | Attending: General Surgery | Admitting: Rehabilitation

## 2020-06-30 ENCOUNTER — Other Ambulatory Visit: Payer: Self-pay

## 2020-06-30 DIAGNOSIS — R293 Abnormal posture: Secondary | ICD-10-CM | POA: Diagnosis not present

## 2020-06-30 NOTE — Patient Instructions (Signed)

## 2020-06-30 NOTE — Therapy (Signed)
[place], [place], [address] Phone: [phone]   Fax:  [phone]  Physical Therapy Evaluation  Patient Details  Name: Samantha Mcbride MRN: [id number] Date of Birth: [birth year] Referring Provider (PT): Dr. Marlou Starks   Encounter Date: 06/30/2020   PT End of Session - 06/30/20 0825    Visit Number 1    Number of Visits 2    Date for PT Re-Evaluation 08/18/20    PT Start Time 0800    PT Stop Time 0824    PT Time Calculation (min) 24 min    Activity Tolerance Patient tolerated treatment well    Behavior During Therapy [facility] for tasks assessed/performed           Past Medical History:  Diagnosis Date  . Anemia    iron deficiency anemia   . CAD S/P percutaneous coronary angioplasty November 2012   Promus DES - mid LAD 3.5 mm x 24 mm (3.72 mm)   . Complication of anesthesia   . Dyslipidemia, goal LDL below 70     on statin, close to goal  . Former moderate cigarette smoker (10-19 per day)   . Glucose intolerance (impaired glucose tolerance)   . H/O thyroid nodule    Benign  . History of ST elevation myocardial infarction (STEMI) of anterior wall November 2012   With cardiac arrest, 100% mobility occlusion. --> Promus DES   . Hypertension   . PONV (postoperative nausea and vomiting)    no issues with surgery on 05-11-2019  . Rheumatoid arthritis ([place])    managed on humira   . SOB (shortness of breath) on exertion    reports "its been that way since my heart attack " repots no recurrence of MI sx since that time     Past Surgical History:  Procedure Laterality Date  . ABDOMINAL AORTAGRAM N/A 08/27/2011   Procedure: ABDOMINAL Maxcine Ham;  Surgeon: Troy Sine, MD;  Location: [facility] CATH LAB;  Service: Cardiovascular;  Laterality: N/A;  . BREAST EXCISIONAL BIOPSY Left   . CARDIAC CATHETERIZATION  November 2012   For anterior STEMI/cardiac arrest -- 100% mid LAD occlusion  . CORONARY ANGIOPLASTY WITH  STENT PLACEMENT  November 2012    mid LAD PCI --> Promus Element DES 3.5 mm at 24 mm (3.72 mm)  . CYSTOSCOPY WITH RETROGRADE PYELOGRAM, URETEROSCOPY AND STENT PLACEMENT Bilateral 05/11/2019   Procedure: CYSTOSCOPY WITH RETROGRADE PYELOGRAM,  AND STENT PLACEMENT;  Surgeon: Lucas Mallow, MD;  Location: [facility] ORS;  Service: Urology;  Laterality: Bilateral;  . CYSTOSCOPY/URETEROSCOPY/HOLMIUM LASER/STENT PLACEMENT Bilateral 05/25/2019   Procedure: CYSTOSCOPY BILATERAL URETEROSCOPY/HOLMIUM LASER/STENT PLACEMENT;  Surgeon: Lucas Mallow, MD;  Location: [facility] ORS;  Service: Urology;  Laterality: Bilateral;  . LEFT HEART CATHETERIZATION WITH CORONARY ANGIOGRAM N/A 08/27/2011   Procedure: LEFT HEART CATHETERIZATION WITH CORONARY ANGIOGRAM;  Surgeon: Troy Sine, MD;  Location: [facility] CATH LAB;  Service: Cardiovascular;  Laterality: N/A;  . PERCUTANEOUS CORONARY STENT INTERVENTION (PCI-S) N/A 08/27/2011   Procedure: PERCUTANEOUS CORONARY STENT INTERVENTION (PCI-S);  Surgeon: Troy Sine, MD;  Location: [facility] CATH LAB;  Service: Cardiovascular;  Laterality: N/A;  . TRANSTHORACIC ECHOCARDIOGRAM  November 2012   EF 40-45%, moderate at K. of mid and distal inferior septum and anterior apical myocardium. Grade 1 diastolic function. -- Followup echocardiogram to reassess his EF was denied by insurance company    There were no vitals filed for this visit.    Subjective Assessment - 06/30/20 0800  Subjective I am getting a compression bra    Pertinent History Diagnosis of Rt breast cancer IDC ER/PR/HER2 positive, plan to have lumpectomy and SLNB after cardiac clearance with radiation and antiestrogens.  Stage 4 kidney failure, RA, and MI in 2012.    Limitations --   none   Patient Stated Goals get information    Currently in Pain? No/denies              [facility] PT Assessment - 06/30/20 0001      Assessment   Medical Diagnosis Rt breast cancer    Referring Provider (PT) Dr. Marlou Starks    Onset  Date/Surgical Date 06/02/20    Hand Dominance Right    Prior Therapy no      Precautions   Precaution Comments active cancer      Restrictions   Weight Bearing Restrictions No      Balance Screen   Has the patient fallen in the past 6 months No    Has the patient had a decrease in activity level because of a fear of falling?  No    Is the patient reluctant to leave their home because of a fear of falling?  No      Home Environment   Living Environment Private residence    Living Arrangements Alone    Available Help at Discharge Family      Prior Function   Level of [place] Retired    Leisure not active currently      Cognition   Overall Cognitive Status Within Functional Limits for tasks assessed      Coordination   Gross Motor Movements are Fluid and Coordinated Yes      Posture/Postural Control   Posture/Postural Control Postural limitations    Postural Limitations Rounded Shoulders;Forward head      ROM / Strength   AROM / PROM / Strength AROM      AROM   AROM Assessment Site Shoulder    Right/Left Shoulder Right;Left    Right Shoulder Extension 63 Degrees    Right Shoulder Flexion 145 Degrees    Right Shoulder ABduction 170 Degrees    Right Shoulder Internal Rotation 70 Degrees    Right Shoulder External Rotation 12 Degrees             LYMPHEDEMA/ONCOLOGY QUESTIONNAIRE - 06/30/20 0001      Lymphedema Assessments   Lymphedema Assessments Upper extremities      Right Upper Extremity Lymphedema   15 cm Proximal to Olecranon Process 39.8 cm    Olecranon Process 28.5 cm    10 cm Proximal to Ulnar Styloid Process 24.7 cm    Just Proximal to Ulnar Styloid Process 18.4 cm    Across Hand at [employer] 19.6 cm    At [place] of 2nd Digit 7.3 cm           L-DEX FLOWSHEETS - 06/30/20 0800      L-DEX LYMPHEDEMA SCREENING   Measurement Type Unilateral    L-DEX MEASUREMENT EXTREMITY Upper Extremity    POSITION  Standing     DOMINANT SIDE Right    At Risk Side Right    BASELINE SCORE (UNILATERAL) -7.4                  Objective measurements completed on examination: See above findings.               PT Education - 06/30/20 [phone]    Education  Details lymphedema briefly, reason for SOZO, post op exercises, POC    Person(s) Educated Patient    Methods Explanation;Demonstration;Handout    Comprehension Verbalized understanding                Breast Clinic Goals - 06/30/20 0830      Patient will be able to verbalize understanding of pertinent lymphedema risk reduction practices relevant to her diagnosis specifically related to skin care.   Status Achieved      Patient will be able to return demonstrate and/or verbalize understanding of the post-op home exercise program related to regaining shoulder range of motion.   Status Achieved      Patient will be able to verbalize understanding of the importance of attending the postoperative After Breast Cancer Class for further lymphedema risk reduction education and therapeutic exercise.   Status Achieved                 Plan - 06/30/20 3818    Clinical Impression Statement Pt presents pre Rt lumpectomy and SLNB and future radiation for baseline assessment and instruction on post op exercises and to establish baseline L-DEX score for lymphedema monitoring    Personal Factors and Comorbidities Age;Comorbidity 2    Comorbidities CKD stage, cancer    Stability/Clinical Decision Making Stable/Uncomplicated    Clinical Decision Making Low    Rehab Potential Excellent    PT Frequency --   post op visit   PT Duration 8 weeks    PT Treatment/Interventions ADLs/Self Care Home Management;Patient/family education;Therapeutic exercise;Manual techniques;Passive range of motion    PT Next Visit Plan post op check with more visits PRN    PT Home Exercise Plan post op    Consulted and Agree with Plan of Care Patient           Patient  will benefit from skilled therapeutic intervention in order to improve the following deficits and impairments:  Postural dysfunction  Visit Diagnosis: Abnormal posture     Problem List Patient Active Problem List   Diagnosis Date Noted  . Malignant neoplasm of upper-outer quadrant of right breast in female, estrogen receptor positive ([place]) 06/15/2020  . Ureteral calculus 05/11/2019  . Obesity (BMI 30-39.9) 07/18/2013  . Essential hypertension   . Dyslipidemia, goal LDL below 70 07/16/2013  . Glucose intolerance (impaired glucose tolerance) 07/16/2013  . Ventricular tachycardia, sustained; Peri-infarct.  No further episodes 08/28/2011  . Hypokalemia 08/27/2011  . H/O Anterior STEMI:  Proximal LAD  insertion of a 3.5x24 mm Promus element DES (08/2011) 08/27/2011  . CAD S/P percutaneous coronary angioplasty 08/16/2011    Stark Bray 06/30/2020, 8:30 AM  [place], [place], [address] Phone: [phone]   Fax:  [phone]  Name: Samantha Mcbride MRN: [id number] Date of Birth: 08/05/1951

## 2020-07-04 ENCOUNTER — Other Ambulatory Visit: Payer: Self-pay | Admitting: Genetic Counselor

## 2020-07-04 DIAGNOSIS — C50411 Malignant neoplasm of upper-outer quadrant of right female breast: Secondary | ICD-10-CM

## 2020-07-04 DIAGNOSIS — Z170 Estrogen receptor positive status [ER+]: Secondary | ICD-10-CM

## 2020-07-05 ENCOUNTER — Inpatient Hospital Stay (HOSPITAL_BASED_OUTPATIENT_CLINIC_OR_DEPARTMENT_OTHER): Payer: Medicare Other | Admitting: Genetic Counselor

## 2020-07-05 ENCOUNTER — Other Ambulatory Visit: Payer: Self-pay

## 2020-07-05 ENCOUNTER — Encounter: Payer: Self-pay | Admitting: Genetic Counselor

## 2020-07-05 ENCOUNTER — Inpatient Hospital Stay: Payer: Medicare Other

## 2020-07-05 DIAGNOSIS — C50411 Malignant neoplasm of upper-outer quadrant of right female breast: Secondary | ICD-10-CM

## 2020-07-05 DIAGNOSIS — Z808 Family history of malignant neoplasm of other organs or systems: Secondary | ICD-10-CM | POA: Diagnosis not present

## 2020-07-05 DIAGNOSIS — Z170 Estrogen receptor positive status [ER+]: Secondary | ICD-10-CM

## 2020-07-05 DIAGNOSIS — Z803 Family history of malignant neoplasm of breast: Secondary | ICD-10-CM | POA: Diagnosis not present

## 2020-07-05 NOTE — Progress Notes (Signed)
REFERRING PROVIDER: Nicholas Lose, MD [address] [place],  [place] [address]  PRIMARY PROVIDER:  Antony Contras, MD  PRIMARY REASON FOR VISIT:  1. Malignant neoplasm of upper-outer quadrant of right breast in female, estrogen receptor positive ([place])   2. Family history of breast cancer   3. Family history of skin cancer       HISTORY OF PRESENT ILLNESS:   Samantha Mcbride, a 69 y.o. female, was seen for a [place] cancer genetics consultation at the request of Dr. Lindi Adie due to a personal and family history of cancer.  Samantha Mcbride presents to clinic today to discuss the possibility of a hereditary predisposition to cancer, genetic testing, and to further clarify her future cancer risks, as well as potential cancer risks for family members.   In August 2021, at the age of 5, Samantha Mcbride was diagnosed with invasive ductal carcinoma, ER+/PR+/Her2-, of the right breast. The treatment plan includes surgery, adjuvant radiation therapy, and adjuvant antiestrogen therapy with anastrozole.    CANCER HISTORY:  Oncology History  Malignant neoplasm of upper-outer quadrant of right breast in female, estrogen receptor positive ([place])  06/02/2020 Initial Diagnosis   05/20/20 showed a 0.6cm asymmetry in the lateral right breast and a 0.5cm mass at the 12:30 position. Biopsy on 06/02/20 showed invasive ductal carcinoma, grade 1, HER-2 equivocal by IHC, negative by FISH, ER+ 95%, PR+ 95%, Ki67 10%     RISK FACTORS:  Menarche was at age 69.  No live births.  OCP use for approximately 1 years.  Ovaries intact: no, one ovary removed.  Hysterectomy: no.  Menopausal status: postmenopausal (early 53s).  HRT use: 0 years. Colonoscopy: yes; 1 polyp. Mammogram within the last year: yes.   Past Medical History:  Diagnosis Date  . Anemia    iron deficiency anemia   . CAD S/P percutaneous coronary angioplasty November 2012   Promus DES - mid LAD 3.5 mm x 24 mm (3.72 mm)   . Complication of  anesthesia   . Dyslipidemia, goal LDL below 70     on statin, close to goal  . Family history of breast cancer   . Family history of skin cancer   . Former moderate cigarette smoker (10-19 per day)   . Glucose intolerance (impaired glucose tolerance)   . H/O thyroid nodule    Benign  . History of ST elevation myocardial infarction (STEMI) of anterior wall November 2012   With cardiac arrest, 100% mobility occlusion. --> Promus DES   . Hypertension   . PONV (postoperative nausea and vomiting)    no issues with surgery on 05-11-2019  . Rheumatoid arthritis ([place])    managed on humira   . SOB (shortness of breath) on exertion    reports "its been that way since my heart attack " repots no recurrence of MI sx since that time     Past Surgical History:  Procedure Laterality Date  . ABDOMINAL AORTAGRAM N/A 08/27/2011   Procedure: ABDOMINAL Maxcine Ham;  Surgeon: Troy Sine, MD;  Location: [facility] CATH LAB;  Service: Cardiovascular;  Laterality: N/A;  . BREAST EXCISIONAL BIOPSY Left   . CARDIAC CATHETERIZATION  November 2012   For anterior STEMI/cardiac arrest -- 100% mid LAD occlusion  . CORONARY ANGIOPLASTY WITH STENT PLACEMENT  November 2012    mid LAD PCI --> Promus Element DES 3.5 mm at 24 mm (3.72 mm)  . CYSTOSCOPY WITH RETROGRADE PYELOGRAM, URETEROSCOPY AND STENT PLACEMENT Bilateral 05/11/2019   Procedure: CYSTOSCOPY WITH RETROGRADE  PYELOGRAM,  AND STENT PLACEMENT;  Surgeon: Lucas Mallow, MD;  Location: [facility] ORS;  Service: Urology;  Laterality: Bilateral;  . CYSTOSCOPY/URETEROSCOPY/HOLMIUM LASER/STENT PLACEMENT Bilateral 05/25/2019   Procedure: CYSTOSCOPY BILATERAL URETEROSCOPY/HOLMIUM LASER/STENT PLACEMENT;  Surgeon: Lucas Mallow, MD;  Location: [facility] ORS;  Service: Urology;  Laterality: Bilateral;  . LEFT HEART CATHETERIZATION WITH CORONARY ANGIOGRAM N/A 08/27/2011   Procedure: LEFT HEART CATHETERIZATION WITH CORONARY ANGIOGRAM;  Surgeon: Troy Sine, MD;  Location: [facility] CATH  LAB;  Service: Cardiovascular;  Laterality: N/A;  . PERCUTANEOUS CORONARY STENT INTERVENTION (PCI-S) N/A 08/27/2011   Procedure: PERCUTANEOUS CORONARY STENT INTERVENTION (PCI-S);  Surgeon: Troy Sine, MD;  Location: [facility] CATH LAB;  Service: Cardiovascular;  Laterality: N/A;  . TRANSTHORACIC ECHOCARDIOGRAM  November 2012   EF 40-45%, moderate at K. of mid and distal inferior septum and anterior apical myocardium. Grade 1 diastolic function. -- Followup echocardiogram to reassess his EF was denied by insurance company    Social History   Socioeconomic History  . Marital status: Single    Spouse name: Not on file  . Number of children: Not on file  . Years of education: Not on file  . Highest education level: Not on file  Occupational History  . Not on file  Tobacco Use  . Smoking status: Former Smoker    Packs/day: 1.00    Years: 40.00    Pack years: 40.00    Types: Cigarettes    Quit date: 08/27/2011    Years since quitting: 8.8  . Smokeless tobacco: Never Used  Substance and Sexual Activity  . Alcohol use: No  . Drug use: No  . Sexual activity: Not on file  Other Topics Concern  . Not on file  Social History Narrative   Single mother of one, grandmother of 2. She works for [employer] for [employer].   She quit smoking time of her MI in November 2012. Does not take alcohol.   She had been doing really well he exercises at least 3-4 days a week, but really get discouraged that she is not been able to drop weight.   Social Determinants of Health   Financial Resource Strain:   . Difficulty of Paying Living Expenses: Not on file  Food Insecurity:   . Worried About Charity fundraiser in the Last Year: Not on file  . Ran Out of Food in the Last Year: Not on file  Transportation Needs:   . Lack of Transportation (Medical): Not on file  . Lack of Transportation (Non-Medical): Not on file  Physical Activity:   . Days of Exercise per Week: Not on file  .  Minutes of Exercise per Session: Not on file  Stress:   . Feeling of Stress : Not on file  Social Connections:   . Frequency of Communication with Friends and Family: Not on file  . Frequency of Social Gatherings with Friends and Family: Not on file  . Attends Religious Services: Not on file  . Active Member of Clubs or Organizations: Not on file  . Attends Archivist Meetings: Not on file  . Marital Status: Not on file     FAMILY HISTORY:  We obtained a detailed, 4-generation family history.  Significant diagnoses are listed below: Family History  Problem Relation Age of Onset  . Hypertension Mother   . Breast cancer Mother 44       early 12's  . Heart failure Father   . Heart attack  Paternal Grandfather   . Breast cancer Sister 11  . Skin cancer Sister 65       face   Samantha Mcbride has one adopted daughter and no biological children. She has four sisters and two brothers. Her oldest sister was diagnosed with breast cancer at the age of 41. Her youngest sister was diagnosed with skin cancer on her face when she was 66.   Samantha Mcbride's mother died at the age of 51 from breast cancer that was diagnosed when she was 82. Samantha Mcbride had six maternal aunts and five maternal uncles, none of whom had cancer. Her maternal grandmother died older than 48, and her maternal grandfather died in his 73s or 30s. There are no other known diagnoses of cancer on the maternal side of the family.  Samantha Mcbride's father died in his late 49s and did not have cancer. She had one paternal aunt and one paternal uncle, both of whom died older than 79 and did not have cancer. Her paternal grandmother died in her 75s and her paternal grandfather died in his 62s. There are no other known diagnoses of cancer on the paternal side of the family.  Samantha Mcbride is unaware of previous family history of genetic testing for hereditary cancer risks. Patient's ancestors are of [place], [place], and [place] descent.  There is no reported Ashkenazi Jewish ancestry. There is no known consanguinity.  GENETIC COUNSELING ASSESSMENT: Samantha Mcbride is a 69 y.o. female with a personal and family history of breast cancer which is somewhat suggestive of a hereditary cancer syndrome and predisposition to cancer. We, therefore, discussed and recommended the following at today's visit.   DISCUSSION: We discussed that approximately 5-10% of breast cancer is hereditary, with most cases associated with the BRCA1 and BRCA2 genes. There are other genes that can be associated with hereditary breast cancer syndromes. These include ATM, CHEK2, PALB2, etc. We discussed that testing is beneficial for several reasons, including knowing about other cancer risks, identifying potential screening and risk-reduction options that may be appropriate, and to understand if other family members could be at risk for cancer and allow them to undergo genetic testing.   We reviewed the characteristics, features and inheritance patterns of hereditary cancer syndromes. We also discussed genetic testing, including the appropriate family members to test, the process of testing, insurance coverage and turn-around-time for results. We discussed the implications of a negative, positive and/or variant of uncertain significant result. In order to get genetic test results in a timely manner so that Samantha Mcbride can use these genetic test results for surgical decisions, we recommended Samantha Mcbride pursue genetic testing for the [facility] Breast Cancer STAT Panel. Once complete, we recommend Samantha Mcbride pursue reflex genetic testing to the Common Hereditary Cancers Panel.   The Breast Cancer STAT Panel offered by [facility] includes sequencing and deletion/duplication analysis for the following 9 genes:  ATM, BRCA1, BRCA2, CDH1, CHEK2, PALB2, PTEN, STK11 and TP53. The Common Hereditary Cancers Panel offered by [facility] includes sequencing and/or deletion duplication testing of  the following 48 genes: APC, ATM, AXIN2, BARD1, BMPR1A, BRCA1, BRCA2, BRIP1, CDH1, CDK4, CDKN2A (p14ARF), CDKN2A (p16INK4a), CHEK2, CTNNA1, DICER1, EPCAM (Deletion/duplication testing only), GREM1 (promoter region deletion/duplication testing only), KIT, MEN1, MLH1, MSH2, MSH3, MSH6, MUTYH, NBN, NF1, NTHL1, PALB2, PDGFRA, PMS2, POLD1, POLE, PTEN, RAD50, RAD51C, RAD51D, RNF43, SDHB, SDHC, SDHD, SMAD4, SMARCA4. STK11, TP53, TSC1, TSC2, and VHL.  The following genes were evaluated for sequence changes only: SDHA and HOXB13 c.251G>A variant only.  Based on Ms. Dedeaux's personal and family history of cancer, she meets medical criteria for genetic testing. Despite that she meets criteria, she may still have an out of pocket cost.   PLAN: After considering the risks, benefits, and limitations, Samantha Mcbride provided informed consent to pursue genetic testing and the blood sample was sent to [facility] for analysis of the Breast Cancer STAT Panel + Common Hereditary Cancers Panel. Results should be available within approximately one-two weeks' time, at which point they will be disclosed by telephone to Samantha Mcbride, as will any additional recommendations warranted by these results. Samantha Mcbride will receive a summary of her genetic counseling visit and a copy of her results once available. This information will also be available in Epic.   Samantha Mcbride's questions were answered to her satisfaction today. Our contact information was provided should additional questions or concerns arise. Thank you for the referral and allowing [place] to share in the care of your patient.   Clint Guy, [place], [facility] Licensed, Certified Dispensing [email or web address] Phone: [phone]  The patient was seen for a total of 30 minutes in face-to-face genetic counseling.  This patient was discussed with Drs. Magrinat, Lindi Adie and/or Burr Medico who agrees with the above.     _______________________________________________________________________ For Office Staff:  Number of people involved in session: 1 Was an Intern/ student involved with case: no

## 2020-07-06 LAB — GENETIC SCREENING ORDER

## 2020-07-11 ENCOUNTER — Ambulatory Visit: Payer: Medicare Other | Admitting: Cardiology

## 2020-07-11 ENCOUNTER — Encounter: Payer: Self-pay | Admitting: Cardiology

## 2020-07-11 ENCOUNTER — Other Ambulatory Visit: Payer: Self-pay

## 2020-07-11 VITALS — BP 124/76 | HR 72 | Temp 97.5°F | Ht 61.0 in | Wt 195.0 lb

## 2020-07-11 DIAGNOSIS — I2109 ST elevation (STEMI) myocardial infarction involving other coronary artery of anterior wall: Secondary | ICD-10-CM | POA: Diagnosis not present

## 2020-07-11 DIAGNOSIS — I10 Essential (primary) hypertension: Secondary | ICD-10-CM

## 2020-07-11 DIAGNOSIS — Z9861 Coronary angioplasty status: Secondary | ICD-10-CM

## 2020-07-11 DIAGNOSIS — Z01810 Encounter for preprocedural cardiovascular examination: Secondary | ICD-10-CM

## 2020-07-11 DIAGNOSIS — E785 Hyperlipidemia, unspecified: Secondary | ICD-10-CM | POA: Diagnosis not present

## 2020-07-11 DIAGNOSIS — R0609 Other forms of dyspnea: Secondary | ICD-10-CM

## 2020-07-11 DIAGNOSIS — R0600 Dyspnea, unspecified: Secondary | ICD-10-CM

## 2020-07-11 DIAGNOSIS — I2510 Atherosclerotic heart disease of native coronary artery without angina pectoris: Secondary | ICD-10-CM

## 2020-07-11 NOTE — Patient Instructions (Signed)
Medication Instructions:  No changes  *If you need a refill on your cardiac medications before your next appointment, please call your pharmacy*   Lab Work: Fasting lipid profile and CMP If you have labs (blood work) drawn today and your tests are completely normal, you will receive your results only by: Marland Kitchen MyChart Message (if you have MyChart) OR . A paper copy in the mail If you have any lab test that is abnormal or we need to change your treatment, we will call you to review the results.   Testing/Procedures: None   Follow-Up: At [facility], you and your health needs are our priority.  As part of our continuing mission to provide you with exceptional heart care, we have created designated Provider Care Teams.  These Care Teams include your primary Cardiologist (physician) and Advanced Practice Providers (APPs -  Physician Assistants and Nurse Practitioners) who all work together to provide you with the care you need, when you need it.  We recommend signing up for the patient portal called "MyChart".  Sign up information is provided on this After Visit Summary.  MyChart is used to connect with patients for Virtual Visits (Telemedicine).  Patients are able to view lab/test results, encounter notes, upcoming appointments, etc.  Non-urgent messages can be sent to your provider as well.   To learn more about what you can do with MyChart, go to [email or web address].    Your next appointment:   4 month(s)  The format for your next appointment:   In Person  Provider:   Glenetta Hew, MD   Other Instructions None

## 2020-07-11 NOTE — Progress Notes (Signed)
Primary Care Provider: Antony Contras, MD Cardiologist: Glenetta Hew, MD Electrophysiologist: None  Gen Sgx: Dr. Marlou Starks  Clinic Note: Chief Complaint  Patient presents with  . Pre-op Exam    Breast cancer surgery  . Coronary Artery Disease    No true angina    HPI:    Marlo Arriola is a 69 y.o. female with a PMH notable for CAD (anterior STEMI complicated by cardiac arrest-LAD PCI), CKD-4, HTN, HLD and rheumatoid arthritis who presents today for earlier than annual follow-up for preop evaluation..    She had been referred to [facility] for lipid management- atorvastatin 20 mg & Repatha PA (never started Repatha). ->  Atorvastatin has been increased to 40 mg.    Claritza July was last seen on September 01, 2019 -> she had been dealing with some obstructive uropathy issues complicated by renal insufficiency.  ACE inhibitor was stopped and carvedilol dose was increased to 12.5 mg twice daily.  She has never started on Repatha, was only taking atorvastatin 40 mg daily. ->  Doing fairly well from my standpoint.  Less get up and go because she had not been on to go up to the gym at the [facility] because of COVID-19 lockdown.  Was scared to go back once things did open up.  Therefore she was getting deconditioned and not getting exercise.  Arthritis was also limiting things and she had switched to Humira.  With what she was able to do, noted chest pain or dyspnea with rest or exertion.  No longer working.  Recent Hospitalizations: None  Reviewed  CV studies:    The following studies were reviewed today: (if available, images/films reviewed: From Epic Chart or Care Everywhere) . None:   Interval History:   Zahira Brummond presents here today for earlier than usual follow-up because the need for preop evaluation -> for upcoming planned right breast cancer surgery.  She had an abnormal mammogram and then was referred to oncology.  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  She has now pending surgery.  From a cardiac standpoint she really has not been doing as much.,  Got more more sedentary.  She is able to do at least 4 metabolic go months without having over the swelling symptoms.  Her left hip also bothers her when it comes to walking.  She said over the summer she had 4 days when she just felt really strange she had intermittent back pain and some chest discomfort.  After this for days or up, she has not had any further symptoms.  Really the back pain is what is keeping her from doing anything active.  She is not really had any of her true anginal type symptoms. She still has not gotten set up with taking Repatha.  Probably because of the change in medications and the diagnosis of cancer, this has fallen by the [facility].  CV Review of Symptoms (Summary): positive for - Intermittent strange episodes of chest pain that seems to be coming from the back.  She does have exertional dyspnea from being the deconditioned.  Not very active negative for - edema, irregular heartbeat, orthopnea, paroxysmal nocturnal dyspnea, rapid heart rate, shortness of breath or Syncope/near syncope, TIA/almost fugax, claudication   The patient does not have symptoms concerning for COVID-19 infection (fever, chills, cough, or new shortness of breath).   REVIEWED OF SYSTEMS   Review of Systems  Constitutional: Positive for malaise/fatigue (Mostly because she is just  not doing things.).  HENT: Negative for congestion and nosebleeds.   Respiratory: Positive for shortness of breath (When she overdoes things). Negative for cough.   Gastrointestinal: Negative for blood in stool, constipation and melena.  Genitourinary: Negative for hematuria.  Musculoskeletal: Positive for joint pain (Left hip and right knee). Negative for falls.  Neurological: Negative for dizziness, focal weakness and headaches.  Psychiatric/Behavioral: Negative for depression (Borderline). The patient is  nervous/anxious (A little more than usual because of her new diagnosis).    I have reviewed and (if needed) personally updated the patient's problem list, medications, allergies, past medical and surgical history, social and family history.   PAST MEDICAL HISTORY   Past Medical History:  Diagnosis Date  . Anemia    iron deficiency anemia   . CAD S/P percutaneous coronary angioplasty November 2012   Promus DES - mid LAD 3.5 mm x 24 mm (3.72 mm)   . Complication of anesthesia   . Dyslipidemia, goal LDL below 70     on statin, close to goal  . Family history of breast cancer   . Family history of skin cancer   . Former moderate cigarette smoker (10-19 per day)   . Glucose intolerance (impaired glucose tolerance)   . H/O thyroid nodule    Benign  . History of ST elevation myocardial infarction (STEMI) of anterior wall November 2012   With cardiac arrest, 100% mobility occlusion. --> Promus DES   . Hypertension   . PONV (postoperative nausea and vomiting)    no issues with surgery on 05-11-2019  . Rheumatoid arthritis ([place])    managed on humira   . SOB (shortness of breath) on exertion    reports "its been that way since my heart attack " repots no recurrence of MI sx since that time     PAST SURGICAL HISTORY   Past Surgical History:  Procedure Laterality Date  . ABDOMINAL AORTAGRAM N/A 08/27/2011   Procedure: ABDOMINAL Maxcine Ham;  Surgeon: Troy Sine, MD;  Location: [facility] CATH LAB;  Service: Cardiovascular;  Laterality: N/A;  . BREAST EXCISIONAL BIOPSY Left   . CYSTOSCOPY WITH RETROGRADE PYELOGRAM, URETEROSCOPY AND STENT PLACEMENT Bilateral 05/11/2019   Procedure: CYSTOSCOPY WITH RETROGRADE PYELOGRAM,  AND STENT PLACEMENT;  Surgeon: Lucas Mallow, MD;  Location: [facility] ORS;  Service: Urology;  Laterality: Bilateral;  . CYSTOSCOPY/URETEROSCOPY/HOLMIUM LASER/STENT PLACEMENT Bilateral 05/25/2019   Procedure: CYSTOSCOPY BILATERAL URETEROSCOPY/HOLMIUM LASER/STENT PLACEMENT;  Surgeon:  Lucas Mallow, MD;  Location: [facility] ORS;  Service: Urology;  Laterality: Bilateral;  . LEFT HEART CATHETERIZATION WITH CORONARY ANGIOGRAM N/A 08/27/2011   Procedure: LEFT HEART CATHETERIZATION WITH CORONARY ANGIOGRAM;  Surgeon: Troy Sine, MD;  Location: [facility] CATH LAB;  Service: Cardiovascular;;For anterior STEMI/cardiac arrest -- 100% mid LAD occlusion  . PERCUTANEOUS CORONARY STENT INTERVENTION (PCI-S) N/A 08/27/2011   Procedure: PERCUTANEOUS CORONARY STENT INTERVENTION (PCI-S);  Surgeon: Troy Sine, MD;  Location: [facility] CATH LAB;  Service: Cardiovascular;;  mid LAD PCI --> Promus Element DES 3.5 mm at 24 mm (3.72 mm)  . TRANSTHORACIC ECHOCARDIOGRAM  November 2012   EF 40-45%, moderate at K. of mid and distal inferior septum and anterior apical myocardium. Grade 1 diastolic function. -- Followup echocardiogram to reassess his EF was denied by [employer] History  Administered Date(s) Administered  . Influenza, High Dose Seasonal PF 06/15/2017  . Influenza,inj,Quad PF,6+ Mos 06/25/2015  . Influenza,trivalent, recombinat, inj, PF 06/12/2018  . PFIZER SARS-COV-2 Vaccination 11/20/2019, 12/15/2019, 06/14/2020  .  Pneumococcal Polysaccharide-23 08/28/2011, 05/12/2019    MEDICATIONS/ALLERGIES   Current Meds  Medication Sig  . Adalimumab (HUMIRA PEN) 40 MG/0.4ML PNKT Inject 40 mg into the skin every 14 (fourteen) days.   Marland Kitchen atorvastatin (LIPITOR) 40 MG tablet TAKE 1 TABLET BY MOUTH AT BEDTIME  . carvedilol (COREG) 12.5 MG tablet Take 12.5 mg by mouth 2 (two) times daily.  . Cholecalciferol (VITAMIN D3) 125 MCG (5000 UT) TABS Take 1 tablet by mouth daily.  . clopidogrel (PLAVIX) 75 MG tablet TAKE 1 TABLET BY MOUTH DAILY  . diclofenac sodium (VOLTAREN) 1 % GEL Apply 2 g topically daily as needed (pain).  . hydroxychloroquine (PLAQUENIL) 200 MG tablet Take 1 tablet (200 mg total) by mouth daily.  . nitroGLYCERIN (NITROSTAT) 0.4 MG SL tablet PLACE 1 TABLET UNDER THE  TONGUE EVERY 5 MINUTES AS NEEDED FOR CHEST PAIN.  Marland Kitchen tiZANidine (ZANAFLEX) 2 MG tablet Take 2 mg by mouth as needed for muscle spasms.  . traZODone (DESYREL) 100 MG tablet Take by mouth at bedtime. Dose unknown     No Known Allergies  SOCIAL HISTORY/FAMILY HISTORY   Reviewed in Epic:  Pertinent findings: no new.   OBJCTIVE -PE, EKG, labs   Wt Readings from Last 3 Encounters:  07/11/20 195 lb (88.5 kg)  06/16/20 195 lb 12.8 oz (88.8 kg)  06/15/20 195 lb (88.5 kg)    Physical Exam: BP 124/76   Pulse 72   Temp (!) 97.5 F (36.4 C)   Ht 5' 1"  (1.549 m)   Wt 195 lb (88.5 kg)   SpO2 94%   BMI 36.84 kg/m  Physical Exam Vitals reviewed.  Constitutional:      General: She is not in acute distress.    Appearance: Normal appearance. She is not ill-appearing.     Comments: Moderate to morbidly obese-BMI of 36 with risk factors.  Well-groomed.  HENT:     Head: Normocephalic and atraumatic.  Neck:     Vascular: No carotid bruit, hepatojugular reflux or JVD.  Cardiovascular:     Rate and Rhythm: Normal rate and regular rhythm.  No extrasystoles are present.    Chest Wall: PMI is not displaced.     Pulses: Normal pulses and intact distal pulses.     Heart sounds: Normal heart sounds. No murmur heard.  No friction rub. No gallop.   Pulmonary:     Effort: Pulmonary effort is normal. No respiratory distress.     Breath sounds: Normal breath sounds.  Musculoskeletal:        General: Swelling (Trivial bilateral LE) present.     Cervical back: Normal range of motion.  Neurological:     General: No focal deficit present.     Mental Status: She is alert and oriented to person, place, and time.  Psychiatric:        Mood and Affect: Mood normal.        Behavior: Behavior normal.        Thought Content: Thought content normal.        Judgment: Judgment normal.     Comments: In surprisingly good spirits with new diagnosis of breast cancer     Adult ECG Report  Rate: 72 ;  Rhythm:  normal sinus rhythm and ~ LAA, CRO Ant MI, age undetermined. ~ TWI in Anterolateral leads - CRO ischeia;   Narrative Interpretation: TWI new  Recent Labs: 09/18/2019: TC 184, TG 229, HDL 36, LDL 108.  Creatinine 2.05 as of July.  Lab Results  Component Value Date   CREATININE 2.16 (H) 05/22/2019   BUN 22 05/22/2019   NA 142 05/22/2019   K 3.7 05/22/2019   CL 106 05/22/2019   CO2 28 05/22/2019   No results found for: TSH  ASSESSMENT/PLAN    Problem List Items Addressed This Visit    H/O Anterior STEMI:  Proximal LAD  insertion of a 3.5x24 mm Promus element DES (08/2011) (Chronic)    She suffered VF and has CPR in the emergency room upon arrival.  No further cardiac arrest since her MI and PCI.  She did have a reduced EF of 40 to 45% post MI.  Unfortunately insurance company denied our attempts to recheck an echocardiogram.  As such, has not been rechecked.  Would not be unreasonable to recheck, however there has not been an indication to do so until maybe perhaps now for preop surgery.      CAD S/P percutaneous coronary angioplasty (Chronic)    Patient is a large sized DES tender mid LAD.  Has not had any further angina.  She has intermittent chest discomfort which is sounds musculoskeletal in nature.  She is now only on carvedilol-ACE inhibitor stopped because of renal insufficiency in the past.   She is on maintenance dose of clopidogrel which can be held 5 to 10 days for any surgeries or procedures.      Dyslipidemia, goal LDL below 70 (Chronic)    LDL is not at goal.  The plan was to add Repatha, but unclear as to why this did not happen.  LDL went up with a very reduced atorvastatin.  She is now back on 40 mg of atorvastatin. Plan: Refer back to [facility] following her breast cancer surgery.  We can recheck lipid panel and CMP as part of her Preop labs.      Relevant Orders   Lipid Profile   Comprehensive Metabolic Panel (CMET)   Essential hypertension (Chronic)    Blood  pressure looks well controlled on carvedilol alone.  For now continue current meds.      Preoperative cardiovascular examination - Primary    Interesting timing for preop evaluation.  She has been as sedentary as she has been since I met her, but is still able to do at least 4 metabolic wellness without significant symptoms. Ideally, we would consider surveillance ischemic evaluation in note patient was sedentary and has history of CAD, however it would not easily change our treatment regimen at this time.  She is not actively having any anginal or CHF symptoms.  She does have a mildly reduced EF but has never had real heart failure. She has CKD-3B with creatinine of roughly 2.  For the most part, these are conditions that we cannot adjust.  From a symptom standpoint, I do not think she would benefit from the stress test report for relatively urgent surgery, I feel that he is reasonably to delay to do any testing.  PREOPERATIVE CARDIAC RISK ASSESSMENT   Revised Cardiac Risk Index:  High Risk Surgery: no; relatively low risk  Defined as Intraperitoneal, intrathoracic or suprainguinal vascular  Active CAD: no; no active chest pain or pressure with rest or exertion.  CHF: no; despite having mildly reduced EF on echo in 2012, she has not had any heart failure symptoms.  We just simply have not checked anything since 2012.  Cerebrovascular Disease: no; number report  Diabetes: no; On Insulin: no  CKD (Cr >~ 2): yes; Cr 2.05 as of this summer.  Total: 1-2 depending if we count reduced EF Estimated Risk of Adverse Outcome: Class III risk (with no active symptoms) Estimated Risk of MI, PE, VF/VT (Cardiac Arrest), Complete Heart Block: 10.1% however mitigated to at least half that with being on stable dose of carvedilol.  %   ACC/AHA Guidelines for "Clearance":  Step 1 - Need for Emergency Surgery: No: Elective surgery, but probably does not need to be delayed.  If Yes - go straight to OR  with perioperative surveillance  Step 2 - Active Cardiac Conditions (Unstable Angina, Decompensated HF, Significant  Arrhytmias - Complete HB, Mobitz II, Symptomatic VT or SVT, Severe Aortic Stenosis - mean gradient > 40 mmHg, Valve area < 1.0 cm2):   No: No active CHF or anginal symptoms.  She does have deconditioning related exertional dyspnea, but no true anginal type symptoms.  If Yes - Evaluate & Treat per ACC/AHA Guidelines  Step 3 -  Low Risk Surgery: Yes  If Yes --> proceed to OR    Step 4 - Functional Capacity >= 4 METS without symptoms: Yes  If Yes --> proceed to OR  If No --> Step 5  Step 5 --  Clinical Risk Factors (CRF) -as noted above  3 or more: No: 2  If Yes -- assess Surgical Risk, --   (High Risk Non-cardiac), Intraabdominal or thoracic vascular surgery consider testing if it will change management.  Intermediate Risk: Proceed to OR with HR control, or consider testing if it will change management  1-2 or more CRFs: Yes  If Yes -- assess Surgical Risk, --   (High Risk Non-cardiac), Intraabdominal or thoracic vascular surgery --> Proceed to OR, or consider testing if it will change management.  Intermediate Risk: Proceed to OR with HR control, or consider testing if it will change management  Low risk surgery: Proceed OR with heart rate control.  Surveillance stress testing will not change treatment options.  Would not delay surgery for further testing.  Would not be unreasonable to consider 2D echocardiogram to get a new baseline since 2012. (We will contact her to determine if this can be scheduled prior to planned surgery, if not, this is not required)        Relevant Orders   Lipid Profile   Comprehensive Metabolic Panel (CMET)   Exertional dyspnea    Mostly this is probably because of deconditioning.  However she does have a known history of reduced EF.  It would be nice to reevaluate her EF to ensure that is not worsening..  Would also be  beneficial perioperatively.          COVID-19 Education: The signs and symptoms of COVID-19 were discussed with the patient and how to seek care for testing (follow up with PCP or arrange E-visit).   The importance of social distancing and COVID-19 vaccination was discussed today. 1 min The patient is practicing social distancing & Masking.   I spent a total of 30 minutes with the patient spent in direct patient consultation.  Additional time spent with chart review  / charting (studies, outside notes, etc): 15 Total Time: 45 min   Current medicines are reviewed at length with the patient today.  (+/- concerns) n/a  Notice: This dictation was prepared with Dragon dictation along with smaller phrase technology. Any transcriptional errors that result from this process are unintentional and may not be corrected upon review.  Patient Instructions / Medication Changes & Studies & Tests Ordered   Patient Instructions  Medication  Instructions:  No changes  *If you need a refill on your cardiac medications before your next appointment, please call your pharmacy*   Lab Work: Fasting lipid profile and CMP If you have labs (blood work) drawn today and your tests are completely normal, you will receive your results only by: Marland Kitchen MyChart Message (if you have MyChart) OR . A paper copy in the mail If you have any lab test that is abnormal or we need to change your treatment, we will call you to review the results.   Testing/Procedures: None   Follow-Up: At [facility], you and your health needs are our priority.  As part of our continuing mission to provide you with exceptional heart care, we have created designated Provider Care Teams.  These Care Teams include your primary Cardiologist (physician) and Advanced Practice Providers (APPs -  Physician Assistants and Nurse Practitioners) who all work together to provide you with the care you need, when you need it.  We recommend signing up  for the patient portal called "MyChart".  Sign up information is provided on this After Visit Summary.  MyChart is used to connect with patients for Virtual Visits (Telemedicine).  Patients are able to view lab/test results, encounter notes, upcoming appointments, etc.  Non-urgent messages can be sent to your provider as well.   To learn more about what you can do with MyChart, go to [email or web address].    Your next appointment:   4 month(s)  The format for your next appointment:   In Person  Provider:   Glenetta Hew, MD   Other Instructions None   Addendum: We will actually have to reschedule for a 2D echocardiogram if possible be done preoperatively to get new baseline.  Indication will be preop evaluation and mildly changes on EKG.  Studies Ordered:   Orders Placed This Encounter  Procedures  . Lipid Profile  . Comprehensive Metabolic Panel (CMET)     Glenetta Hew, M.D., M.S. Interventional Cardiologist   Pager # [phone] Phone # [phone] [address] [place], [place] [address]   Thank you for choosing Heartcare at [facility]!!

## 2020-07-15 ENCOUNTER — Encounter: Payer: Self-pay | Admitting: Genetic Counselor

## 2020-07-15 ENCOUNTER — Encounter: Payer: Self-pay | Admitting: Cardiology

## 2020-07-15 DIAGNOSIS — R0609 Other forms of dyspnea: Secondary | ICD-10-CM | POA: Insufficient documentation

## 2020-07-15 DIAGNOSIS — Z1379 Encounter for other screening for genetic and chromosomal anomalies: Secondary | ICD-10-CM | POA: Insufficient documentation

## 2020-07-15 DIAGNOSIS — R0600 Dyspnea, unspecified: Secondary | ICD-10-CM | POA: Insufficient documentation

## 2020-07-15 HISTORY — PX: BREAST LUMPECTOMY: SHX2

## 2020-07-15 NOTE — Assessment & Plan Note (Signed)
Patient is a large sized DES tender mid LAD.  Has not had any further angina.  She has intermittent chest discomfort which is sounds musculoskeletal in nature.  She is now only on carvedilol-ACE inhibitor stopped because of renal insufficiency in the past.   She is on maintenance dose of clopidogrel which can be held 5 to 10 days for any surgeries or procedures.

## 2020-07-15 NOTE — Assessment & Plan Note (Signed)
Mostly this is probably because of deconditioning.  However she does have a known history of reduced EF.  It would be nice to reevaluate her EF to ensure that is not worsening..  Would also be beneficial perioperatively.

## 2020-07-15 NOTE — Assessment & Plan Note (Signed)
She suffered VF and has CPR in the emergency room upon arrival.  No further cardiac arrest since her MI and PCI.  She did have a reduced EF of 40 to 45% post MI.  Unfortunately insurance company denied our attempts to recheck an echocardiogram.  As such, has not been rechecked.  Would not be unreasonable to recheck, however there has not been an indication to do so until maybe perhaps now for preop surgery.

## 2020-07-15 NOTE — Assessment & Plan Note (Signed)
LDL is not at goal.  The plan was to add Repatha, but unclear as to why this did not happen.  LDL went up with a very reduced atorvastatin.  She is now back on 40 mg of atorvastatin. Plan: Refer back to [facility] following her breast cancer surgery.  We can recheck lipid panel and CMP as part of her Preop labs.

## 2020-07-15 NOTE — Assessment & Plan Note (Signed)
Blood pressure looks well controlled on carvedilol alone.  For now continue current meds.

## 2020-07-15 NOTE — Assessment & Plan Note (Addendum)
Interesting timing for preop evaluation.  She has been as sedentary as she has been since I met her, but is still able to do at least 4 metabolic wellness without significant symptoms. Ideally, we would consider surveillance ischemic evaluation in note patient was sedentary and has history of CAD, however it would not easily change our treatment regimen at this time.  She is not actively having any anginal or CHF symptoms.  She does have a mildly reduced EF but has never had real heart failure. She has CKD-3B with creatinine of roughly 2.  For the most part, these are conditions that we cannot adjust.  From a symptom standpoint, I do not think she would benefit from the stress test report for relatively urgent surgery, I feel that he is reasonably to delay to do any testing.  PREOPERATIVE CARDIAC RISK ASSESSMENT   Revised Cardiac Risk Index:  High Risk Surgery: no; relatively low risk  Defined as Intraperitoneal, intrathoracic or suprainguinal vascular  Active CAD: no; no active chest pain or pressure with rest or exertion.  CHF: no; despite having mildly reduced EF on echo in 2012, she has not had any heart failure symptoms.  We just simply have not checked anything since 2012.  Cerebrovascular Disease: no; number report  Diabetes: no; On Insulin: no  CKD (Cr >~ 2): yes; Cr 2.05 as of this summer.  Total: 1-2 depending if we count reduced EF Estimated Risk of Adverse Outcome: Class III risk (with no active symptoms) Estimated Risk of MI, PE, VF/VT (Cardiac Arrest), Complete Heart Block: 10.1% however mitigated to at least half that with being on stable dose of carvedilol.  %   ACC/AHA Guidelines for "Clearance":  Step 1 - Need for Emergency Surgery: No: Elective surgery, but probably does not need to be delayed.  If Yes - go straight to OR with perioperative surveillance  Step 2 - Active Cardiac Conditions (Unstable Angina, Decompensated HF, Significant  Arrhytmias - Complete HB,  Mobitz II, Symptomatic VT or SVT, Severe Aortic Stenosis - mean gradient > 40 mmHg, Valve area < 1.0 cm2):   No: No active CHF or anginal symptoms.  She does have deconditioning related exertional dyspnea, but no true anginal type symptoms.  If Yes - Evaluate & Treat per ACC/AHA Guidelines  Step 3 -  Low Risk Surgery: Yes  If Yes --> proceed to OR    Step 4 - Functional Capacity >= 4 METS without symptoms: Yes  If Yes --> proceed to OR  If No --> Step 5  Step 5 --  Clinical Risk Factors (CRF) -as noted above  3 or more: No: 2  If Yes -- assess Surgical Risk, --   (High Risk Non-cardiac), Intraabdominal or thoracic vascular surgery consider testing if it will change management.  Intermediate Risk: Proceed to OR with HR control, or consider testing if it will change management  1-2 or more CRFs: Yes  If Yes -- assess Surgical Risk, --   (High Risk Non-cardiac), Intraabdominal or thoracic vascular surgery --> Proceed to OR, or consider testing if it will change management.  Intermediate Risk: Proceed to OR with HR control, or consider testing if it will change management  Low risk surgery: Proceed OR with heart rate control.  Surveillance stress testing will not change treatment options.  Would not delay surgery for further testing.  Would not be unreasonable to consider 2D echocardiogram to get a new baseline since 2012. (We will contact her to determine if this can  be scheduled prior to planned surgery, if not, this is not required)

## 2020-07-17 ENCOUNTER — Other Ambulatory Visit: Payer: Self-pay | Admitting: Cardiology

## 2020-07-18 NOTE — Addendum Note (Signed)
Addended by: Merri Ray A on: 07/18/2020 03:30 PM   Modules accepted: Orders

## 2020-07-19 ENCOUNTER — Telehealth: Payer: Self-pay | Admitting: *Deleted

## 2020-07-19 DIAGNOSIS — R0600 Dyspnea, unspecified: Secondary | ICD-10-CM

## 2020-07-19 DIAGNOSIS — R0609 Other forms of dyspnea: Secondary | ICD-10-CM

## 2020-07-19 DIAGNOSIS — I2510 Atherosclerotic heart disease of native coronary artery without angina pectoris: Secondary | ICD-10-CM

## 2020-07-19 DIAGNOSIS — Z01810 Encounter for preprocedural cardiovascular examination: Secondary | ICD-10-CM

## 2020-07-19 NOTE — Telephone Encounter (Signed)
Spoke to patient.  Informed patient would like for her to have echo schedule prior to her breast surgery .  Pateint states the the surgery will be schedule Oct 22 or Nov 5  Depending on availability. Patient states DrHarding  Recommended she have the surgery as inpatient not outpatient facility.  Patient aware office will call her with time a day for echo.

## 2020-07-19 NOTE — Telephone Encounter (Signed)
-----   Message from Leonie Man, MD sent at 07/15/2020  2:21 AM EDT ----- Regarding: I changed my mind on Seema Blum, would like to actually order 2D echo. Ivin Booty, the more I reevaluated her preop risk, I think we probably ought to get a 2D echo prior to having surgery.  I have been looking for a reason to recheck 1 and this is probably give me one.  This should be several diagnoses on the problem list that I checked for this visit that can be used for ordering an echo.  Preop evaluation, exertional dyspnea, CAD.  Sorry about the late last minute change of mind.  Glenetta Hew, MD

## 2020-07-20 ENCOUNTER — Ambulatory Visit (HOSPITAL_COMMUNITY): Payer: Medicare Other | Attending: Cardiovascular Disease

## 2020-07-20 ENCOUNTER — Other Ambulatory Visit: Payer: Self-pay

## 2020-07-20 DIAGNOSIS — R0609 Other forms of dyspnea: Secondary | ICD-10-CM

## 2020-07-20 DIAGNOSIS — R0600 Dyspnea, unspecified: Secondary | ICD-10-CM | POA: Diagnosis not present

## 2020-07-20 DIAGNOSIS — I2510 Atherosclerotic heart disease of native coronary artery without angina pectoris: Secondary | ICD-10-CM

## 2020-07-20 DIAGNOSIS — Z9861 Coronary angioplasty status: Secondary | ICD-10-CM

## 2020-07-20 DIAGNOSIS — Z01810 Encounter for preprocedural cardiovascular examination: Secondary | ICD-10-CM | POA: Insufficient documentation

## 2020-07-20 HISTORY — PX: TRANSTHORACIC ECHOCARDIOGRAM: SHX275

## 2020-07-20 LAB — ECHOCARDIOGRAM COMPLETE
AR max vel: 2.19 cm2
AV Area VTI: 2.34 cm2
AV Area mean vel: 2.1 cm2
AV Mean grad: 4.6 mmHg
AV Peak grad: 8.5 mmHg
Ao pk vel: 1.46 m/s
Area-P 1/2: 3.27 cm2
S' Lateral: 3.8 cm

## 2020-07-20 MED ORDER — PERFLUTREN LIPID MICROSPHERE
1.0000 mL | INTRAVENOUS | Status: AC | PRN
  Administered 2020-07-20: 2 mL via INTRAVENOUS

## 2020-07-22 ENCOUNTER — Encounter: Payer: Self-pay | Admitting: *Deleted

## 2020-07-25 ENCOUNTER — Encounter: Payer: Self-pay | Admitting: *Deleted

## 2020-07-25 DIAGNOSIS — Z170 Estrogen receptor positive status [ER+]: Secondary | ICD-10-CM

## 2020-07-25 DIAGNOSIS — C50411 Malignant neoplasm of upper-outer quadrant of right female breast: Secondary | ICD-10-CM

## 2020-07-26 ENCOUNTER — Telehealth: Payer: Self-pay | Admitting: Hematology and Oncology

## 2020-07-26 NOTE — Telephone Encounter (Signed)
schedled per 10/11 scheduling message spoke with patient she is aware

## 2020-07-27 ENCOUNTER — Encounter: Payer: Self-pay | Admitting: *Deleted

## 2020-07-27 NOTE — Progress Notes (Signed)
[id number] [place], [place] - [address] [place] [address] Phone: [phone] Fax: [phone]      Your procedure is scheduled on October 22  Report to [facility] Main Entrance "A" at [place].M., and check in at the Admitting office.  Call this number if you have problems the morning of surgery:  [phone]  Call [phone] if you have any questions prior to your surgery date Monday-Friday 8am-4pm    Remember:  Do not eat after midnight the night before your surgery  You may drink clear liquids until 0815 am the morning of your surgery.   Clear liquids allowed are: Water, Non-Citrus Juices (without pulp), Carbonated Beverages, Clear Tea, Black Coffee Only, and Gatorade  Please complete your PRE-SURGERY ENSURE that was provided to you by 0815 am the morning of surgery.  Please, if able, drink it in one setting. DO NOT SIP.     Take these medicines the morning of surgery with A SIP OF WATER  acetaminophen (TYLENOL) if needed atorvastatin (LIPITOR) carvedilol (COREG)  Eye drops if needed  Follow your surgeon's instructions on when to stop clopidogrel (PLAVIX).  If no instructions were given by your surgeon then you will need to call the office to get those instructions.     As of today, STOP taking any Aspirin (unless otherwise instructed by your surgeon) Aleve, Naproxen, Ibuprofen, Motrin, Advil, Goody's, BC's, all herbal medications, fish oil, and all vitamins. diclofenac sodium (VOLTAREN)                      Do not wear jewelry, make up, or nail polish            Do not wear lotions, powders, perfumes, or deodorant.            Do not shave 48 hours prior to surgery.              Do not bring valuables to the hospital.            [facility] is not responsible for any belongings or valuables.  Do NOT Smoke (Tobacco/Vaping) or drink Alcohol 24 hours prior to your procedure If you use a CPAP at night,  you may bring all equipment for your overnight stay.   Contacts, glasses, dentures or bridgework may not be worn into surgery.      For patients admitted to the hospital, discharge time will be determined by your treatment team.   Patients discharged the day of surgery will not be allowed to drive home, and someone needs to stay with them for 24 hours.    Special instructions:   [place]- Preparing For Surgery  Before surgery, you can play an important role. Because skin is not sterile, your skin needs to be as free of germs as possible. You can reduce the number of germs on your skin by washing with CHG (chlorahexidine gluconate) Soap before surgery.  CHG is an antiseptic cleaner which kills germs and bonds with the skin to continue killing germs even after washing.    Oral Hygiene is also important to reduce your risk of infection.  Remember - BRUSH YOUR TEETH THE MORNING OF SURGERY WITH YOUR REGULAR TOOTHPASTE  Please do not use if you have an allergy to CHG or antibacterial soaps. If your skin becomes reddened/irritated stop using the CHG.  Do not shave (including legs and underarms) for at least 48  hours prior to first CHG shower. It is OK to shave your face.  Please follow these instructions carefully.   1. Shower the NIGHT BEFORE SURGERY and the MORNING OF SURGERY with CHG Soap.   2. If you chose to wash your hair, wash your hair first as usual with your normal shampoo.  3. After you shampoo, rinse your hair and body thoroughly to remove the shampoo.  4. Use CHG as you would any other liquid soap. You can apply CHG directly to the skin and wash gently with a scrungie or a clean washcloth.   5. Apply the CHG Soap to your body ONLY FROM THE NECK DOWN.  Do not use on open wounds or open sores. Avoid contact with your eyes, ears, mouth and genitals (private parts). Wash Face and genitals (private parts)  with your normal soap.   6. Wash thoroughly, paying special attention to  the area where your surgery will be performed.  7. Thoroughly rinse your body with warm water from the neck down.  8. DO NOT shower/wash with your normal soap after using and rinsing off the CHG Soap.  9. Pat yourself dry with a CLEAN TOWEL.  10. Wear CLEAN PAJAMAS to bed the night before surgery  11. Place CLEAN SHEETS on your bed the night of your first shower and DO NOT SLEEP WITH PETS.   Day of Surgery: Wear Clean/Comfortable clothing the morning of surgery Do not apply any deodorants/lotions.   Remember to brush your teeth WITH YOUR REGULAR TOOTHPASTE.   Please read over the following fact sheets that you were given.

## 2020-07-28 ENCOUNTER — Encounter (HOSPITAL_COMMUNITY): Payer: Self-pay

## 2020-07-28 ENCOUNTER — Other Ambulatory Visit: Payer: Self-pay | Admitting: General Surgery

## 2020-07-28 ENCOUNTER — Other Ambulatory Visit: Payer: Self-pay

## 2020-07-28 ENCOUNTER — Encounter (HOSPITAL_COMMUNITY)
Admission: RE | Admit: 2020-07-28 | Discharge: 2020-07-28 | Disposition: A | Discharge status: Home / Self Care | Payer: Medicare Other | Source: Ambulatory Visit | Attending: General Surgery | Admitting: General Surgery

## 2020-07-28 DIAGNOSIS — C50911 Malignant neoplasm of unspecified site of right female breast: Secondary | ICD-10-CM | POA: Insufficient documentation

## 2020-07-28 DIAGNOSIS — Z79899 Other long term (current) drug therapy: Secondary | ICD-10-CM | POA: Insufficient documentation

## 2020-07-28 DIAGNOSIS — Z170 Estrogen receptor positive status [ER+]: Secondary | ICD-10-CM

## 2020-07-28 DIAGNOSIS — Z87891 Personal history of nicotine dependence: Secondary | ICD-10-CM | POA: Insufficient documentation

## 2020-07-28 DIAGNOSIS — Z01812 Encounter for preprocedural laboratory examination: Secondary | ICD-10-CM | POA: Insufficient documentation

## 2020-07-28 DIAGNOSIS — I129 Hypertensive chronic kidney disease with stage 1 through stage 4 chronic kidney disease, or unspecified chronic kidney disease: Secondary | ICD-10-CM | POA: Insufficient documentation

## 2020-07-28 DIAGNOSIS — Z803 Family history of malignant neoplasm of breast: Secondary | ICD-10-CM | POA: Insufficient documentation

## 2020-07-28 DIAGNOSIS — I252 Old myocardial infarction: Secondary | ICD-10-CM | POA: Insufficient documentation

## 2020-07-28 DIAGNOSIS — N184 Chronic kidney disease, stage 4 (severe): Secondary | ICD-10-CM | POA: Insufficient documentation

## 2020-07-28 DIAGNOSIS — C50211 Malignant neoplasm of upper-inner quadrant of right female breast: Secondary | ICD-10-CM

## 2020-07-28 DIAGNOSIS — I2510 Atherosclerotic heart disease of native coronary artery without angina pectoris: Secondary | ICD-10-CM | POA: Insufficient documentation

## 2020-07-28 HISTORY — DX: Chronic kidney disease, unspecified: N18.9

## 2020-07-28 LAB — BASIC METABOLIC PANEL
Anion gap: 10 (ref 5–15)
BUN: 27 mg/dL — ABNORMAL HIGH (ref 8–23)
CO2: 22 mmol/L (ref 22–32)
Calcium: 9 mg/dL (ref 8.9–10.3)
Chloride: 106 mmol/L (ref 98–111)
Creatinine, Ser: 2.36 mg/dL — ABNORMAL HIGH (ref 0.44–1.00)
GFR, Estimated: 20 mL/min — ABNORMAL LOW (ref >60)
Glucose, Bld: 97 mg/dL (ref 70–99)
Potassium: 3.6 mmol/L (ref 3.5–5.1)
Sodium: 138 mmol/L (ref 135–145)

## 2020-07-28 LAB — CBC
HCT: 36.5 % (ref 36.0–46.0)
Hemoglobin: 11.3 g/dL — ABNORMAL LOW (ref 12.0–15.0)
MCH: 28.5 pg (ref 26.0–34.0)
MCHC: 31 g/dL (ref 30.0–36.0)
MCV: 91.9 fL (ref 80.0–100.0)
Platelets: 222 10*3/uL (ref 150–400)
RBC: 3.97 MIL/uL (ref 3.87–5.11)
RDW: 12.7 % (ref 11.5–15.5)
WBC: 8.8 10*3/uL (ref 4.0–10.5)
nRBC: 0 % (ref 0.0–0.2)

## 2020-07-28 LAB — SURGICAL PCR SCREEN
MRSA, PCR: NEGATIVE
Staphylococcus aureus: NEGATIVE

## 2020-07-28 NOTE — Progress Notes (Signed)
PCP - Dr. Ellyn Hack Cardiologist - Dr. Rockwell Germany Nephrologist - Dr. Carolin Sicks Rheumatologist - Dr. Kathlene November  Chest x-ray - n/a EKG - 07/11/20 Stress Test - pt denies ECHO - 07/20/20 Cardiac Cath - 08/27/11   Blood Thinner Instructions: Follow your surgeon's instructions on when to stop Plavix.  If no instructions were given by your surgeon then you will need to call the office to get those instructions.    Per pt she plans to stop plavix 5 days prior to surgery. Her last dose will be 07/30/20  Aspirin Instructions: .n/a  Pt also plans to not take her Adalimumab (HUMIRA PEN) injection dose because she cannot mix medications with any antibiotics d/t kidney condition.  ERAS Protcol - yes PRE-SURGERY Ensure or G2- no drink ordered  COVID TEST- 08/02/20  Coronavirus Screening  Have you experienced the following symptoms:  Cough yes/no: No Fever (>100.74F)  yes/no: No Runny nose yes/no: No Sore throat yes/no: No Difficulty breathing/shortness of breath  yes/no: No  Have you or a family member traveled in the last 14 days and where? yes/no: No   If the patient indicates "YES" to the above questions, their PAT will be rescheduled to limit the exposure to others and, the surgeon will be notified. THE PATIENT WILL NEED TO BE ASYMPTOMATIC FOR 14 DAYS.   If the patient is not experiencing any of these symptoms, the PAT nurse will instruct them to NOT bring anyone with them to their appointment since they may have these symptoms or traveled as well.   Please remind your patients and families that hospital visitation restrictions are in effect and the importance of the restrictions.     Anesthesia review: cardiac hx  Patient denies shortness of breath, fever, cough and chest pain at PAT appointment   All instructions explained to the patient, with a verbal understanding of the material. Patient agrees to go over the instructions while at home for a better understanding. Patient also instructed  to self quarantine after being tested for COVID-19. The opportunity to ask questions was provided.

## 2020-07-29 NOTE — Progress Notes (Addendum)
Anesthesia Chart Review:  Case: [id number] Date/Time: 08/05/20 1100   Procedure: RIGHT BREAST LUMPECTOMY WITH RADIOACTIVE SEED AND SENTINEL LYMPH NODE BIOPSY (Right Breast)   Anesthesia type: General   Pre-op diagnosis: RIGHT BREAST CANCER   Location: [place] OR ROOM 07 / [place] OR   Surgeons: [name], MD      DISCUSSION: Patient is a 69 year old Mcbride scheduled for the above procedure.  History includes former smoker (quit 08/27/11), post-operative N/V, CAD (anterior STEMI complicated by VT/cardiac arrest with CPR/defib x3 in ED s/p DES LAD 08/27/11), CKD (stage IV), HTN, HLD, RA, impaired glucose tolerance, thyroid nodule ("benign"), exertional dyspnea, iron deficiency anemia, right breast cancer. BMI is consistent with obesity.  Preoperative cardiac evaluation on 07/11/20 by Dr. Ellyn Hack: "Interesting timing for preop evaluation.  She has been as sedentary as she has been since I met her, but is still able to do at least 4 metabolic wellness without significant symptoms. Ideally, we would consider surveillance ischemic evaluation in note patient was sedentary and has history of CAD, however it would not easily change our treatment regimen at this time.  She is not actively having any anginal or CHF symptoms.  She does have a mildly reduced EF but has never had real heart failure. She has CKD-3B with creatinine of roughly 2.  For the most part, these are conditions that we cannot adjust.  From a symptom standpoint, I do not think she would benefit from the stress test report for relatively urgent surgery, I feel that he is reasonably to delay to do any testing." He did order a repeat echo which showed stable LVEF 40-45%. She reported instructions for last Plavix 07/30/20.   Her preoperative Creatinine was 2.36 which is consistent with previous results from last year. Will request most recent notes from [place].  Presurgical COVID-19 test is scheduled for 08/02/2020. It looks  like she is also scheduled for her RSL that day as well--I have left a voice message for [facility] RN Navigator to clarify timing.    Chart will be left for follow-up.   ADDENDUM 08/01/20 4:59 PM: Presurgical COVID-19 test rescheduled for 08/01/20--result is still in process. RSL for 08/02/20. Patient last seen by nephrology on 04/28/20 Baseline Creatinine noted to be ~ 2.1-2.5 which is consistent with her PAT labs. Anesthesia team to evaluate on the day of surgery.     VS: BP 139/72   Pulse 71   Temp 36.6 C (Oral)   Resp 18   Ht 5' 1"  (1.549 m)   Wt 88.3 kg   SpO2 98%   BMI 36.79 kg/m    PROVIDERS: Antony Contras, MD is PCP Glenetta Hew, MD is cardiologist Nicholas Lose, MD is [facility] Kyung Rudd, MD is [facility] Lahoma Rocker, MD is rheumatologist Lawson Radar, MD is nephrologist   LABS: Preoperative labs noted. See DISCUSSION. (all labs ordered are listed, but only abnormal results are displayed)  Labs Reviewed  BASIC METABOLIC PANEL - Abnormal; Notable for the following components:      Result Value   BUN 27 (*)    Creatinine, Ser 2.36 (*)    GFR, Estimated 20 (*)    All other components within normal limits  CBC - Abnormal; Notable for the following components:   Hemoglobin 11.3 (*)    All other components within normal limits  SURGICAL PCR SCREEN   Lab Results  Component Value Date   CREATININE 2.36 (H) 07/28/2020   CREATININE 2.16 (H) 05/22/2019   CREATININE  2.36 (H) 05/14/2019     EKG: 07/11/20 ([facility]) Normal sinus rhythm Possible left atrial enlargement Low voltage QRS Cannot rule out anterior septal infarct, age undetermined T wave abnormality, consider lateral ischemia   CV: Echo 07/20/20: IMPRESSIONS  1. No LV thrombus on contrast imaging. The mid to apical anterior  segments, apical septum, and apex are severely hypokinetic consistent with  likely prior infarction. Left ventricular ejection fraction, by  estimation, is 40 to 45%. The  left ventricle has  mildly decreased function. The left ventricle demonstrates regional wall  motion abnormalities (see scoring diagram/findings for description). Left  ventricular diastolic parameters are consistent with Grade II diastolic  dysfunction (pseudonormalization).  Elevated left atrial pressure.  2. Right ventricular systolic function is normal. The right ventricular  size is normal. Tricuspid regurgitation signal is inadequate for assessing  PA pressure.  3. The mitral valve is grossly normal. Trivial mitral valve  regurgitation. No evidence of mitral stenosis.  4. The aortic valve was not well visualized. Aortic valve regurgitation  is not visualized. No aortic stenosis is present.  5. The inferior vena cava is normal in size with greater than 50%  respiratory variability, suggesting right atrial pressure of 3 mmHg.  - Comparison(s): Prior images unable to be directly viewed, comparison made  by report only. No significant change from prior study. 08/30/11 EF  40-45%.   LHC (Emergent CODE STEMI/cardiac arrest) 08/27/11: ANGIOGRAPHIC DATA: - Left Main: Normal appearing vessel which bifurcated into the LAD and left circumflex coronary artery. - Left Anterior Descending: Gave rise to a very proximal septal perforating artery and then began to taper with narrowing of 50%. - Left Circumflex: Angiographically normal it gave rise to a bifurcating obtuse marginal branch. - Right Coronary Artery: A large caliber dominant vessel that had mild 20% proximal and distal luminal smooth narrowing. The right coronary artery and a large posterolateral coronary artery. There was no evidence for any collateralization to the LAD system via the right coronary artery. - Ventriculography revealed acute moderate left ventricular dysfunction with severe hypokinesis involving the mid distal anterolateral lateral extending around the apex to involve the distal inferoapical segment. The initial  ejection fraction estimate is in the range of 35-40%. There is vigorous contractility involving the basal rales both anteriorly and posteriorly. - Percutaneous Coronary Intervention to the left anterior descending artery with PTCA, stenting with a 3.5x24 mm Promus element DES stent, postdilated to 3.72 mm, the 100% proximal LAD occlusion was successfully opened and reduced to 0%. The initial TIMI 0 flow improved to TIMI 3 flow. There was no evidence for any dissection. - Distal Aortography reveals mild tortuosity but no significant obstructive disease. Renal arteries are patent bilaterally.  IMPRESSION: 1.  Acute ST segment elevation anterior lateral myocardial infarction secondary to total proximal LAD occlusion 2. Normal left circumflex coronary artery 3. Dominant right coronary artery with mild 20% luminal narrowing 4. Successful percutaneous coronary intervention of the LAD (Promus element DES)   Past Medical History:  Diagnosis Date  . Anemia    iron deficiency anemia   . CAD S/P percutaneous coronary angioplasty November 2012   Promus DES - mid LAD 3.5 mm x 24 mm (3.72 mm)   . Chronic kidney disease    stage 4 kidney disease per pt dx 02/2019  . Complication of anesthesia   . Dyslipidemia, goal LDL below 70     on statin, close to goal  . Family history of breast cancer   . Family history  of skin cancer   . Former moderate cigarette smoker (10-19 per day)   . Glucose intolerance (impaired glucose tolerance)   . H/O thyroid nodule    Benign  . History of ST elevation myocardial infarction (STEMI) of anterior wall November 2012   With cardiac arrest, 100% mobility occlusion. --> Promus DES   . Hypertension   . PONV (postoperative nausea and vomiting)    no issues with surgery on 05-11-2019  . Rheumatoid arthritis ([place])    managed on humira   . SOB (shortness of breath) on exertion    reports "its been that way since my heart attack " repots no recurrence of MI sx since that time      Past Surgical History:  Procedure Laterality Date  . ABDOMINAL AORTAGRAM N/A 08/27/2011   Procedure: ABDOMINAL Maxcine Ham;  Surgeon: Troy Sine, MD;  Location: [facility] CATH LAB;  Service: Cardiovascular;  Laterality: N/A;  . ANKLE SURGERY Right 1995   per pt ankle surgery here at [facility]   . APPENDECTOMY    . BREAST EXCISIONAL BIOPSY Left   . CYSTOSCOPY WITH RETROGRADE PYELOGRAM, URETEROSCOPY AND STENT PLACEMENT Bilateral 05/11/2019   Procedure: CYSTOSCOPY WITH RETROGRADE PYELOGRAM,  AND STENT PLACEMENT;  Surgeon: Lucas Mallow, MD;  Location: [facility] ORS;  Service: Urology;  Laterality: Bilateral;  . CYSTOSCOPY/URETEROSCOPY/HOLMIUM LASER/STENT PLACEMENT Bilateral 05/25/2019   Procedure: CYSTOSCOPY BILATERAL URETEROSCOPY/HOLMIUM LASER/STENT PLACEMENT;  Surgeon: Lucas Mallow, MD;  Location: [facility] ORS;  Service: Urology;  Laterality: Bilateral;  . LEFT HEART CATHETERIZATION WITH CORONARY ANGIOGRAM N/A 08/27/2011   Procedure: LEFT HEART CATHETERIZATION WITH CORONARY ANGIOGRAM;  Surgeon: Troy Sine, MD;  Location: [facility] CATH LAB;  Service: Cardiovascular;;For anterior STEMI/cardiac arrest -- 100% mid LAD occlusion  . PERCUTANEOUS CORONARY STENT INTERVENTION (PCI-S) N/A 08/27/2011   Procedure: PERCUTANEOUS CORONARY STENT INTERVENTION (PCI-S);  Surgeon: Troy Sine, MD;  Location: [facility] CATH LAB;  Service: Cardiovascular;;  mid LAD PCI --> Promus Element DES 3.5 mm at 24 mm (3.72 mm)  . TRANSTHORACIC ECHOCARDIOGRAM  November 2012   EF 40-45%, moderate at K. of mid and distal inferior septum and anterior apical myocardium. Grade 1 diastolic function. -- Followup echocardiogram to reassess his EF was denied by insurance company    MEDICATIONS: . acetaminophen (TYLENOL) 500 MG tablet  . Adalimumab (HUMIRA PEN) 40 MG/0.4ML PNKT  . atorvastatin (LIPITOR) 20 MG tablet  . atorvastatin (LIPITOR) 40 MG tablet  . Calcium Citrate-Vitamin D (CALCIUM + D PO)  . carvedilol (COREG) 12.5 MG tablet  .  clopidogrel (PLAVIX) 75 MG tablet  . diclofenac sodium (VOLTAREN) 1 % GEL  . DM-APAP-CPM (CORICIDIN HBP PO)  . hydroxychloroquine (PLAQUENIL) 200 MG tablet  . Melatonin 5 MG CAPS  . nitroGLYCERIN (NITROSTAT) 0.4 MG SL tablet  . Olopatadine HCl (PATADAY OP)  . tiZANidine (ZANAFLEX) 2 MG tablet  . traZODone (DESYREL) 100 MG tablet   No current facility-administered medications for this encounter.  She is holding Plavix for 5 days. She is holding her Humira dose before surgery.    Myra Gianotti, PA-C Surgical Short Stay/Anesthesiology [facility] Phone [phone] [facility] Phone [phone] 07/29/2020 5:46 PM

## 2020-08-01 ENCOUNTER — Other Ambulatory Visit (HOSPITAL_COMMUNITY)
Admission: RE | Admit: 2020-08-01 | Discharge: 2020-08-01 | Disposition: A | Discharge status: Home / Self Care | Payer: Medicare Other | Source: Ambulatory Visit | Attending: General Surgery | Admitting: General Surgery

## 2020-08-01 DIAGNOSIS — Z20822 Contact with and (suspected) exposure to covid-19: Secondary | ICD-10-CM | POA: Diagnosis not present

## 2020-08-01 DIAGNOSIS — Z01812 Encounter for preprocedural laboratory examination: Secondary | ICD-10-CM | POA: Diagnosis present

## 2020-08-01 LAB — SARS CORONAVIRUS 2 (TAT 6-24 HRS): SARS Coronavirus 2: NEGATIVE

## 2020-08-01 NOTE — Anesthesia Preprocedure Evaluation (Addendum)
Anesthesia Evaluation  Patient identified by MRN, date of birth, ID band Patient awake    Reviewed: Allergy & Precautions, NPO status , Patient's Chart, lab work & pertinent test results  History of Anesthesia Complications (+) PONV  Airway Mallampati: II  TM Distance: >3 FB Neck ROM: Full    Dental no notable dental hx.    Pulmonary neg pulmonary ROS, former smoker,    Pulmonary exam normal breath sounds clear to auscultation       Cardiovascular hypertension, + CAD, + Past MI and + Cardiac Stents  Normal cardiovascular exam Rhythm:Regular Rate:Normal     Neuro/Psych negative neurological ROS  negative psych ROS   GI/Hepatic negative GI ROS, Neg liver ROS,   Endo/Other  negative endocrine ROS  Renal/GU Renal InsufficiencyRenal disease  negative genitourinary   Musculoskeletal negative musculoskeletal ROS (+)   Abdominal   Peds negative pediatric ROS (+)  Hematology negative hematology ROS (+)   Anesthesia Other Findings   Reproductive/Obstetrics negative OB ROS                            Anesthesia Physical Anesthesia Plan  ASA: III  Anesthesia Plan: General   Post-op Pain Management:  Regional for Post-op pain   Induction: Intravenous  PONV Risk Score and Plan: 4 or greater and Ondansetron, Dexamethasone and Treatment may vary due to age or medical condition  Airway Management Planned: LMA  Additional Equipment:   Intra-op Plan:   Post-operative Plan: Extubation in OR  Informed Consent: I have reviewed the patients History and Physical, chart, labs and discussed the procedure including the risks, benefits and alternatives for the proposed anesthesia with the patient or authorized representative who has indicated his/her understanding and acceptance.     Dental advisory given  Plan Discussed with: CRNA and Surgeon  Anesthesia Plan Comments: (PAT note written by  Myra Gianotti, PA-C. )       Anesthesia Quick Evaluation

## 2020-08-02 ENCOUNTER — Ambulatory Visit
Admission: RE | Admit: 2020-08-02 | Discharge: 2020-08-02 | Disposition: A | Discharge status: Home / Self Care | Payer: Medicare Other | Source: Ambulatory Visit | Attending: General Surgery | Admitting: General Surgery

## 2020-08-02 ENCOUNTER — Other Ambulatory Visit (HOSPITAL_COMMUNITY): Payer: Medicare Other

## 2020-08-02 ENCOUNTER — Other Ambulatory Visit: Payer: Self-pay

## 2020-08-02 DIAGNOSIS — C50211 Malignant neoplasm of upper-inner quadrant of right female breast: Secondary | ICD-10-CM

## 2020-08-02 DIAGNOSIS — Z170 Estrogen receptor positive status [ER+]: Secondary | ICD-10-CM

## 2020-08-05 ENCOUNTER — Encounter (HOSPITAL_COMMUNITY)
Admission: RE | Disposition: A | Discharge status: Home / Self Care | Payer: Self-pay | Source: Home / Self Care | Attending: General Surgery

## 2020-08-05 ENCOUNTER — Other Ambulatory Visit: Payer: Self-pay

## 2020-08-05 ENCOUNTER — Ambulatory Visit
Admission: RE | Admit: 2020-08-05 | Discharge: 2020-08-05 | Disposition: A | Discharge status: Home / Self Care | Payer: Medicare Other | Source: Ambulatory Visit | Attending: General Surgery | Admitting: General Surgery

## 2020-08-05 ENCOUNTER — Ambulatory Visit (HOSPITAL_COMMUNITY)
Admission: RE | Admit: 2020-08-05 | Discharge: 2020-08-05 | Disposition: A | Discharge status: Home / Self Care | Payer: Medicare Other | Attending: General Surgery | Admitting: General Surgery

## 2020-08-05 ENCOUNTER — Ambulatory Visit (HOSPITAL_COMMUNITY)
Admission: RE | Admit: 2020-08-05 | Discharge: 2020-08-05 | Disposition: A | Discharge status: Home / Self Care | Payer: Medicare Other | Source: Ambulatory Visit | Attending: General Surgery | Admitting: General Surgery

## 2020-08-05 ENCOUNTER — Ambulatory Visit (HOSPITAL_COMMUNITY): Payer: Medicare Other | Admitting: Physician Assistant

## 2020-08-05 ENCOUNTER — Encounter (HOSPITAL_COMMUNITY): Payer: Self-pay | Admitting: General Surgery

## 2020-08-05 DIAGNOSIS — Z955 Presence of coronary angioplasty implant and graft: Secondary | ICD-10-CM | POA: Diagnosis not present

## 2020-08-05 DIAGNOSIS — I129 Hypertensive chronic kidney disease with stage 1 through stage 4 chronic kidney disease, or unspecified chronic kidney disease: Secondary | ICD-10-CM | POA: Diagnosis not present

## 2020-08-05 DIAGNOSIS — Z170 Estrogen receptor positive status [ER+]: Secondary | ICD-10-CM

## 2020-08-05 DIAGNOSIS — C50211 Malignant neoplasm of upper-inner quadrant of right female breast: Secondary | ICD-10-CM

## 2020-08-05 DIAGNOSIS — N189 Chronic kidney disease, unspecified: Secondary | ICD-10-CM | POA: Diagnosis not present

## 2020-08-05 DIAGNOSIS — I252 Old myocardial infarction: Secondary | ICD-10-CM | POA: Diagnosis not present

## 2020-08-05 DIAGNOSIS — Z87891 Personal history of nicotine dependence: Secondary | ICD-10-CM | POA: Insufficient documentation

## 2020-08-05 DIAGNOSIS — I2510 Atherosclerotic heart disease of native coronary artery without angina pectoris: Secondary | ICD-10-CM | POA: Insufficient documentation

## 2020-08-05 DIAGNOSIS — Z79899 Other long term (current) drug therapy: Secondary | ICD-10-CM | POA: Insufficient documentation

## 2020-08-05 HISTORY — PX: BREAST LUMPECTOMY WITH RADIOACTIVE SEED AND SENTINEL LYMPH NODE BIOPSY: SHX6550

## 2020-08-05 SURGERY — BREAST LUMPECTOMY WITH RADIOACTIVE SEED AND SENTINEL LYMPH NODE BIOPSY
Anesthesia: General | Site: Breast | Laterality: Right

## 2020-08-05 MED ORDER — CHLORHEXIDINE GLUCONATE CLOTH 2 % EX PADS: 6.0000 | MEDICATED_PAD | Freq: Once | CUTANEOUS | Status: DC

## 2020-08-05 MED ORDER — OXYCODONE HCL 5 MG PO TABS: 5.0000 mg | ORAL_TABLET | Freq: Once | ORAL | Status: DC | PRN

## 2020-08-05 MED ORDER — METHYLENE BLUE 0.5 % INJ SOLN
INTRAVENOUS | Status: AC
  Filled 2020-08-05: qty 10

## 2020-08-05 MED ORDER — PROPOFOL 10 MG/ML IV BOLUS
INTRAVENOUS | Status: AC
  Filled 2020-08-05: qty 20

## 2020-08-05 MED ORDER — FENTANYL CITRATE (PF) 100 MCG/2ML IJ SOLN
INTRAMUSCULAR | Status: DC | PRN
  Administered 2020-08-05 (×2): 50 ug via INTRAVENOUS

## 2020-08-05 MED ORDER — SODIUM CHLORIDE (PF) 0.9 % IJ SOLN: INTRAVENOUS | Status: DC | PRN

## 2020-08-05 MED ORDER — CELECOXIB 200 MG PO CAPS: 200.0000 mg | ORAL_CAPSULE | ORAL | Status: AC

## 2020-08-05 MED ORDER — ACETAMINOPHEN 500 MG PO TABS
1000.0000 mg | ORAL_TABLET | ORAL | Status: AC
  Administered 2020-08-05: 1000 mg via ORAL
  Filled 2020-08-05: qty 2

## 2020-08-05 MED ORDER — GABAPENTIN 300 MG PO CAPS
300.0000 mg | ORAL_CAPSULE | ORAL | Status: AC
  Administered 2020-08-05: 300 mg via ORAL
  Filled 2020-08-05: qty 1

## 2020-08-05 MED ORDER — MIDAZOLAM HCL 2 MG/2ML IJ SOLN
INTRAMUSCULAR | Status: AC
  Administered 2020-08-05: 2 mg via INTRAVENOUS
  Filled 2020-08-05: qty 2

## 2020-08-05 MED ORDER — HYDROCODONE-ACETAMINOPHEN 5-325 MG PO TABS: 1.0000 | ORAL_TABLET | Freq: Four times a day (QID) | ORAL | 0 refills | Status: DC | PRN

## 2020-08-05 MED ORDER — FENTANYL CITRATE (PF) 100 MCG/2ML IJ SOLN
INTRAMUSCULAR | Status: AC
  Administered 2020-08-05: 100 ug via INTRAVENOUS
  Filled 2020-08-05: qty 2

## 2020-08-05 MED ORDER — DEXAMETHASONE SODIUM PHOSPHATE 4 MG/ML IJ SOLN
INTRAMUSCULAR | Status: DC | PRN
  Administered 2020-08-05: 5 mg via INTRAVENOUS

## 2020-08-05 MED ORDER — LACTATED RINGERS IV SOLN: INTRAVENOUS | Status: DC

## 2020-08-05 MED ORDER — MIDAZOLAM HCL 2 MG/2ML IJ SOLN
INTRAMUSCULAR | Status: AC
  Filled 2020-08-05: qty 4

## 2020-08-05 MED ORDER — SODIUM CHLORIDE 0.9 % IV SOLN: INTRAVENOUS | Status: DC

## 2020-08-05 MED ORDER — SODIUM CHLORIDE (PF) 0.9 % IJ SOLN
INTRAMUSCULAR | Status: AC
  Filled 2020-08-05: qty 10

## 2020-08-05 MED ORDER — LIDOCAINE 2% (20 MG/ML) 5 ML SYRINGE
INTRAMUSCULAR | Status: DC | PRN
  Administered 2020-08-05: 100 mg via INTRAVENOUS

## 2020-08-05 MED ORDER — CELECOXIB 200 MG PO CAPS
ORAL_CAPSULE | ORAL | Status: AC
  Administered 2020-08-05: 200 mg via ORAL
  Filled 2020-08-05: qty 1

## 2020-08-05 MED ORDER — ONDANSETRON HCL 4 MG/2ML IJ SOLN
INTRAMUSCULAR | Status: DC | PRN
  Administered 2020-08-05: 4 mg via INTRAVENOUS

## 2020-08-05 MED ORDER — BUPIVACAINE HCL (PF) 0.25 % IJ SOLN
INTRAMUSCULAR | Status: AC
  Filled 2020-08-05: qty 30

## 2020-08-05 MED ORDER — FENTANYL CITRATE (PF) 250 MCG/5ML IJ SOLN
INTRAMUSCULAR | Status: AC
  Filled 2020-08-05: qty 5

## 2020-08-05 MED ORDER — CEFAZOLIN SODIUM-DEXTROSE 2-4 GM/100ML-% IV SOLN
2.0000 g | INTRAVENOUS | Status: AC
  Administered 2020-08-05: 2 g via INTRAVENOUS
  Filled 2020-08-05: qty 100

## 2020-08-05 MED ORDER — ORAL CARE MOUTH RINSE: 15.0000 mL | Freq: Once | OROMUCOSAL | Status: AC

## 2020-08-05 MED ORDER — OXYCODONE HCL 5 MG/5ML PO SOLN: 5.0000 mg | Freq: Once | ORAL | Status: DC | PRN

## 2020-08-05 MED ORDER — MIDAZOLAM HCL 2 MG/2ML IJ SOLN: 2.0000 mg | Freq: Once | INTRAMUSCULAR | Status: AC

## 2020-08-05 MED ORDER — 0.9 % SODIUM CHLORIDE (POUR BTL) OPTIME
TOPICAL | Status: DC | PRN
  Administered 2020-08-05: 1000 mL

## 2020-08-05 MED ORDER — PHENYLEPHRINE HCL (PRESSORS) 10 MG/ML IV SOLN
INTRAVENOUS | Status: DC | PRN
  Administered 2020-08-05: 40 ug via INTRAVENOUS

## 2020-08-05 MED ORDER — CHLORHEXIDINE GLUCONATE 0.12 % MT SOLN
15.0000 mL | Freq: Once | OROMUCOSAL | Status: AC
  Administered 2020-08-05: 15 mL via OROMUCOSAL
  Filled 2020-08-05: qty 15

## 2020-08-05 MED ORDER — BUPIVACAINE HCL 0.25 % IJ SOLN
INTRAMUSCULAR | Status: DC | PRN
  Administered 2020-08-05: 10 mL

## 2020-08-05 MED ORDER — PROPOFOL 10 MG/ML IV BOLUS
INTRAVENOUS | Status: DC | PRN
  Administered 2020-08-05: 200 mg via INTRAVENOUS

## 2020-08-05 MED ORDER — HYDROMORPHONE HCL 1 MG/ML IJ SOLN: 0.2500 mg | INTRAMUSCULAR | Status: DC | PRN

## 2020-08-05 MED ORDER — MIDAZOLAM HCL 2 MG/2ML IJ SOLN
INTRAMUSCULAR | Status: AC
  Filled 2020-08-05: qty 2

## 2020-08-05 MED ORDER — FENTANYL CITRATE (PF) 100 MCG/2ML IJ SOLN: 100.0000 ug | Freq: Once | INTRAMUSCULAR | Status: AC

## 2020-08-05 MED ORDER — TECHNETIUM TC 99M SULFUR COLLOID FILTERED
1.0000 | Freq: Once | INTRAVENOUS | Status: AC | PRN
  Administered 2020-08-05: 1 via INTRADERMAL

## 2020-08-05 SURGICAL SUPPLY — 35 items
APPLIER CLIP 9.375 MED OPEN (MISCELLANEOUS) ×2
BINDER BREAST LRG (GAUZE/BANDAGES/DRESSINGS) IMPLANT
BINDER BREAST XLRG (GAUZE/BANDAGES/DRESSINGS) IMPLANT
CANISTER SUCT 3000ML PPV (MISCELLANEOUS) ×2 IMPLANT
CHLORAPREP W/TINT 26 (MISCELLANEOUS) ×2 IMPLANT
CLIP APPLIE 9.375 MED OPEN (MISCELLANEOUS) ×1 IMPLANT
CNTNR URN SCR LID CUP LEK RST (MISCELLANEOUS) ×1 IMPLANT
CONT SPEC 4OZ STRL OR WHT (MISCELLANEOUS) ×2
COVER PROBE W GEL 5X96 (DRAPES) ×2 IMPLANT
COVER SURGICAL LIGHT HANDLE (MISCELLANEOUS) ×2 IMPLANT
COVER WAND RF STERILE (DRAPES) IMPLANT
DERMABOND ADVANCED (GAUZE/BANDAGES/DRESSINGS) ×2
DERMABOND ADVANCED .7 DNX12 (GAUZE/BANDAGES/DRESSINGS) ×2 IMPLANT
DEVICE DUBIN SPECIMEN MAMMOGRA (MISCELLANEOUS) ×2 IMPLANT
DRAPE CHEST BREAST 15X10 FENES (DRAPES) ×2 IMPLANT
ELECT COATED BLADE 2.86 ST (ELECTRODE) ×2 IMPLANT
ELECT REM PT RETURN 9FT ADLT (ELECTROSURGICAL) ×2
ELECTRODE REM PT RTRN 9FT ADLT (ELECTROSURGICAL) ×1 IMPLANT
GLOVE BIO SURGEON STRL SZ7.5 (GLOVE) ×4 IMPLANT
GOWN STRL REUS W/ TWL LRG LVL3 (GOWN DISPOSABLE) ×2 IMPLANT
GOWN STRL REUS W/TWL LRG LVL3 (GOWN DISPOSABLE) ×4
KIT BASIN OR (CUSTOM PROCEDURE TRAY) ×2 IMPLANT
KIT MARKER MARGIN INK (KITS) ×2 IMPLANT
LIGHT WAVEGUIDE WIDE FLAT (MISCELLANEOUS) IMPLANT
NEEDLE 18GX1X1/2 (RX/OR ONLY) (NEEDLE) IMPLANT
NEEDLE FILTER BLUNT 18X 1/2SAF (NEEDLE)
NEEDLE FILTER BLUNT 18X1 1/2 (NEEDLE) IMPLANT
NEEDLE HYPO 25GX1X1/2 BEV (NEEDLE) ×2 IMPLANT
NS IRRIG 1000ML POUR BTL (IV SOLUTION) ×2 IMPLANT
PACK GENERAL/GYN (CUSTOM PROCEDURE TRAY) ×2 IMPLANT
SUT MNCRL AB 4-0 PS2 18 (SUTURE) ×4 IMPLANT
SUT VIC AB 3-0 SH 18 (SUTURE) ×2 IMPLANT
SYR CONTROL 10ML LL (SYRINGE) ×2 IMPLANT
TOWEL GREEN STERILE (TOWEL DISPOSABLE) ×2 IMPLANT
TOWEL GREEN STERILE FF (TOWEL DISPOSABLE) ×2 IMPLANT

## 2020-08-05 NOTE — Transfer of Care (Signed)
Immediate Anesthesia Transfer of Care Note  Patient: Samantha Mcbride  Procedure(s) Performed: RIGHT BREAST LUMPECTOMY WITH RADIOACTIVE SEED AND SENTINEL LYMPH NODE BIOPSY (Right Breast)  Patient Location: PACU  Anesthesia Type:GA combined with regional for post-op pain  Level of Consciousness: awake, alert  and oriented  Airway & Oxygen Therapy: Patient Spontanous Breathing and Patient connected to face mask oxygen  Post-op Assessment: Report given to RN, Post -op Vital signs reviewed and stable and Patient moving all extremities  Post vital signs: Reviewed and stable  Last Vitals:  Vitals Value Taken Time  BP 127/72 08/05/20 1220  Temp    Pulse 59 08/05/20 1224  Resp 10 08/05/20 1224  SpO2 99 % 08/05/20 1224  Vitals shown include unvalidated device data.  Last Pain:  Vitals:   08/05/20 0934  TempSrc:   PainSc: 0-No pain         Complications: No complications documented.

## 2020-08-05 NOTE — Op Note (Signed)
08/05/2020  12:13 PM  PATIENT:  Samantha Mcbride  69 y.o. female  PRE-OPERATIVE DIAGNOSIS:  RIGHT BREAST CANCER  POST-OPERATIVE DIAGNOSIS:  RIGHT BREAST CANCER  PROCEDURE:  Procedure(s): RIGHT BREAST LUMPECTOMY WITH RADIOACTIVE SEED LOCALIZATION AND DEEP RIGHT AXILLARY SENTINEL LYMPH NODE BIOPSY WITH INJECTION BLUE DYE  SURGEON:  Surgeon(s) and Role:    * [name], MD - Primary  PHYSICIAN ASSISTANT:   ASSISTANTS: none   ANESTHESIA:   local and general  EBL:  minimal   BLOOD ADMINISTERED:none  DRAINS: none   LOCAL MEDICATIONS USED:  MARCAINE     SPECIMEN:  Source of Specimen:  right breast tissue with additional medial margin and sentinel node   DISPOSITION OF SPECIMEN:  PATHOLOGY  COUNTS:  YES  TOURNIQUET:  * No tourniquets in log *  DICTATION: .Dragon Dictation   After informed consent was obtained the patient was brought to the operating room and placed in the supine position on the operating table.  After adequate induction of general anesthesia the patient's right chest, breast, and axillary area were prepped with ChloraPrep, allowed to dry, and draped in usual sterile manner.  An appropriate timeout was performed.  Previously an I-125 seed was placed in the upper inner quadrant of the right breast to mark an area of invasive breast cancer.  Also earlier in the day the patient underwent injection of 1 mCi of technetium sulfur colloid in the subareolar position on the right.  I also injected 2 cc of methylene blue and 3 cc of injectable saline in the subareolar region on the right.  Attention was first turned to the right axilla.  There was a slight signal using the neoprobe set on technetium in the right axilla.  This area was infiltrated with quarter percent Marcaine.  A small transversely oriented incision was made with a 15 blade knife overlying the area of radioactivity.  The incision was carried through the skin and subcutaneous tissue sharply with the  electrocautery until the deep right axillary space was entered.  Blunt hemostat dissection was carried out under the direction of the neoprobe.  I was able to identify a hot lymph node.  This was excised sharply with the electrocautery and the surrounding small vessels and lymphatics were controlled with clips.  Ex vivo counts on this node were approximately 200.  No other hot or palpable or blue nodes were identified in the right axilla.  Hemostasis was achieved using the Bovie electrocautery.  The deep layer of the wound was then closed with interrupted 3-0 Vicryl stitches.  Skin was then closed with a running 4-0 Monocryl subcuticular stitch.  Attention was then turned to the right breast.  The neoprobe was set to I-125 and the area of radioactivity was readily identified.  An elliptical incision was made overlying the area of radioactivity with a 15 blade knife.  The incision was carried through the skin and subcutaneous tissue sharply with the electrocautery.  Dissection was then carried widely around the radioactive seed while checking the area of radioactivity frequently.  Once this dissection was complete then the specimen was removed.  It was oriented with the appropriate paint colors.  A specimen radiograph was obtained that showed the clip and seed to be near the center of the specimen.  I did elect to take an additional medial margin based on the images.  This was marked appropriately and all of the tissue was sent to pathology for further evaluation.  Hemostasis was achieved using the  Bovie electrocautery.  The wound was irrigated with saline and infiltrated with more quarter percent Marcaine.  The cavity was marked with clips.  The deep layer of the wound was then closed with layers of interrupted 3-0 Vicryl stitches.  The skin was closed with a running 4-0 Monocryl subcuticular stitch.  Dermabond dressings were applied.  The patient tolerated the procedure well.  At the end of the case all needle  sponge and instrument counts were correct.  The patient was then awakened and taken recovery in stable condition.  PLAN OF CARE: Discharge to home after PACU  PATIENT DISPOSITION:  PACU - hemodynamically stable.   Delay start of Pharmacological VTE agent (>24hrs) due to surgical blood loss or risk of bleeding: not applicable

## 2020-08-05 NOTE — H&P (Signed)
Samantha Mcbride  Location: [facility] Surgery Patient #: [id number] DOB: June 20, 1951 Single / Language: Cleophus Molt / Race: White Female   History of Present Illness  The patient is a 69 year old female who presents with breast cancer. We're asked to see the patient in consultation by Dr. Marijo File to evaluate her for a new right breast cancer. The patient is a 69 year old white female who recently went for a routine screening mammogram. At that time she was found to have a 5 mm area of asymmetry in the inner aspect of the right breast with clinically negative looking nodes. 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 She does have a family history of breast cancer in her mother and her sister. She quit smoking 9 years ago after her last heart attack. She does take blood thinners for cardiac stents. She also has rheumatoid arthritis and chronic end-stage kidney failure but is not on dialysis.   Past Surgical History Appendectomy   Diagnostic Studies History  Colonoscopy  1-5 years ago Mammogram  within last year  Allergies No Known Drug Allergies   Medication History  Hydroxychloroquine Sulfate (200MG Tablet, Oral) Active. Atorvastatin Calcium (20MG Tablet, Oral) Active. Carvedilol (12.5MG Tablet, Oral) Active. tiZANidine HCl (2MG Tablet, Oral) Active. traZODone HCl (100MG Tablet, Oral) Active. Clopidogrel Bisulfate (75MG Tablet, Oral) Active. Nitroglycerin (0.4MG Tab Sublingual, Sublingual) Active. Calcium + Vitamin D3 (500-400MG-UNIT Tablet Chewable, Oral) Active. Humira (40MG/0.4ML Prefill Syr Kit, Subcutaneous) Active. Melatonin (5MG Capsule, Oral) Active. Voltaren (External) Specific strength unknown - Active. Medications Reconciled  Pregnancy / Birth History Age at menarche  50 years. Age of menopause  <46 Gravida  0 Irregular periods  Para  0  Other  Problems  Breast Cancer  Chronic Renal Failure Syndrome  High blood pressure  Kidney Stone  Myocardial infarction     Review of Systems  General Not Present- Appetite Loss, Chills, Fatigue, Fever, Night Sweats, Weight Gain and Weight Loss. Note: All other systems negative (unless as noted in HPI & included Review of Systems) Skin Not Present- Change in Wart/Mole, Dryness, Hives, Jaundice, New Lesions, Non-Healing Wounds, Rash and Ulcer. HEENT Not Present- Earache, Hearing Loss, Hoarseness, Nose Bleed, Oral Ulcers, Ringing in the Ears, Seasonal Allergies, Sinus Pain, Sore Throat, Visual Disturbances, Wears glasses/contact lenses and Yellow Eyes. Respiratory Not Present- Bloody sputum, Chronic Cough, Difficulty Breathing, Snoring and Wheezing. Breast Not Present- Breast Mass, Breast Pain, Nipple Discharge and Skin Changes. Cardiovascular Not Present- Chest Pain, Difficulty Breathing Lying Down, Leg Cramps, Palpitations, Rapid Heart Rate, Shortness of Breath and Swelling of Extremities. Gastrointestinal Not Present- Abdominal Pain, Bloating, Bloody Stool, Change in Bowel Habits, Chronic diarrhea, Constipation, Difficulty Swallowing, Excessive gas, Gets full quickly at meals, Hemorrhoids, Indigestion, Nausea, Rectal Pain and Vomiting. Female Genitourinary Not Present- Frequency, Nocturia, Painful Urination, Pelvic Pain and Urgency. Musculoskeletal Not Present- Back Pain, Joint Pain, Joint Stiffness, Muscle Pain, Muscle Weakness and Swelling of Extremities. Neurological Not Present- Decreased Memory, Fainting, Headaches, Numbness, Seizures, Tingling, Tremor, Trouble walking and Weakness. Psychiatric Not Present- Anxiety, Bipolar, Change in Sleep Pattern, Depression, Fearful and Frequent crying. Endocrine Not Present- Cold Intolerance, Excessive Hunger, Hair Changes, Heat Intolerance, Hot flashes and New Diabetes. Hematology Not Present- Easy Bruising, Excessive bleeding, Gland problems, HIV  and Persistent Infections.  Vitals Weight: 195.4 lb Height: 61in Body Surface Area: 1.87 m Body Mass Index: 36.92 kg/m  Temp.: 71F (Temporal)  Pulse: 86 (Regular)  P.OX: 94% (Room air) BP: 122/88(Sitting, Right Arm, Standard)       Physical Exam  General Mental Status-Alert. General Appearance-Consistent with stated age. Hydration-Well hydrated. Voice-Normal.  Head and Neck Head-normocephalic, atraumatic with no lesions or palpable masses. Trachea-midline. Thyroid Gland Characteristics - normal size and consistency.  Eye Eyeball - Bilateral-Extraocular movements intact. Sclera/Conjunctiva - Bilateral-No scleral icterus.  Chest and Lung Exam Chest and lung exam reveals -quiet, even and easy respiratory effort with no use of accessory muscles and on auscultation, normal breath sounds, no adventitious sounds and normal vocal resonance. Inspection Chest Wall - Normal. Back - normal.  Breast Note: There is no palpable mass in either breast. There is no palpable axillary, supraclavicular, or cervical lymphadenopathy. There is a well-healed central left breast scar from an abscess that was drained a long time ago   Cardiovascular Cardiovascular examination reveals -normal heart sounds, regular rate and rhythm with no murmurs and normal pedal pulses bilaterally.  Abdomen Inspection Inspection of the abdomen reveals - No Hernias. Skin - Scar - no surgical scars. Palpation/Percussion Palpation and Percussion of the abdomen reveal - Soft, Non Tender, No Rebound tenderness, No Rigidity (guarding) and No hepatosplenomegaly. Auscultation Auscultation of the abdomen reveals - Bowel sounds normal.  Neurologic Neurologic evaluation reveals -alert and oriented x 3 with no impairment of recent or remote memory. Mental Status-Normal.  Musculoskeletal Normal Exam - Left-Upper Extremity Strength Normal and Lower Extremity Strength  Normal. Normal Exam - Right-Upper Extremity Strength Normal and Lower Extremity Strength Normal.  Lymphatic Head & Neck  General Head & Neck Lymphatics: Bilateral - Description - Normal. Axillary  General Axillary Region: Bilateral - Description - Normal. Tenderness - Non Tender. Femoral & Inguinal  Generalized Femoral & Inguinal Lymphatics: Bilateral - Description - Normal. Tenderness - Non Tender.    Assessment & Plan  MALIGNANT NEOPLASM OF UPPER-INNER QUADRANT OF RIGHT BREAST IN FEMALE, ESTROGEN RECEPTOR POSITIVE (C50.211) Impression: The patient appears to have a very small stage I cancer of the upper inner quadrant of the right breast was clinically negative nodes. I have discussed with her the different options for treatment and at this point she favors breast conservation which I feel is a very reasonable way of treating her cancer. She is also a good candidate for sentinel node mapping. I have discussed with her in detail the risks and benefits of the operation as well as some of the technical aspects including the use of a radioactive seed for localization and she understands and wishes to proceed. I will also refer her to medical and radiation oncology to discuss adjuvant therapy. She will need cardiac clearance from Dr. Ellyn Hack given her history of myocardial infarction and the fact that she is on blood thinners. This patient encounter took 60 minutes today to perform the following: take history, perform exam, review outside records, interpret imaging, counsel the patient on their diagnosis and document encounter, findings & plan in the EHR Current Plans Referred to Oncology, for evaluation and follow up (Oncology). Routine. Referred to Physical Therapy, for evaluation and follow up (Physical Therapy). Routine.

## 2020-08-05 NOTE — Anesthesia Procedure Notes (Signed)
Anesthesia Regional Block: Pectoralis block   Pre-Anesthetic Checklist: ,, timeout performed, Correct Patient, Correct Site, Correct Laterality, Correct Procedure, Correct Position, site marked, Risks and benefits discussed,  Surgical consent,  Pre-op evaluation,  At surgeon's request and post-op pain management  Laterality: Right  Prep: chloraprep       Needles:  Injection technique: Single-shot  Needle Type: Echogenic Needle     Needle Length: 9cm      Additional Needles:   Procedures:,,,, ultrasound used (permanent image in chart),,,,  Narrative:  Start time: 08/05/2020 10:37 AM End time: 08/05/2020 10:44 AM Injection made incrementally with aspirations every 5 mL.  Performed by: Personally  Anesthesiologist: Myrtie Soman, MD  Additional Notes: Patient tolerated the procedure well without complications

## 2020-08-05 NOTE — Anesthesia Postprocedure Evaluation (Signed)
Anesthesia Post Note  Patient: Samantha Mcbride  Procedure(s) Performed: RIGHT BREAST LUMPECTOMY WITH RADIOACTIVE SEED AND SENTINEL LYMPH NODE BIOPSY (Right Breast)     Patient location during evaluation: PACU Anesthesia Type: General Level of consciousness: awake and alert Pain management: pain level controlled Vital Signs Assessment: post-procedure vital signs reviewed and stable Respiratory status: spontaneous breathing, nonlabored ventilation, respiratory function stable and patient connected to nasal cannula oxygen Cardiovascular status: blood pressure returned to baseline and stable Postop Assessment: no apparent nausea or vomiting Anesthetic complications: no   No complications documented.  Last Vitals:  Vitals:   08/05/20 1219 08/05/20 1234  BP: 127/72 133/78  Pulse: 62 68  Resp: 12 18  Temp: 36.4 C 36.6 C  SpO2: 100% 91%    Last Pain:  Vitals:   08/05/20 1219  TempSrc:   PainSc: 2                  Amos Micheals S

## 2020-08-05 NOTE — Interval H&P Note (Signed)
History and Physical Interval Note:  08/05/2020 10:39 AM  Samantha Mcbride  has presented today for surgery, with the diagnosis of RIGHT BREAST CANCER.  The various methods of treatment have been discussed with the patient and family. After consideration of risks, benefits and other options for treatment, the patient has consented to  Procedure(s): RIGHT BREAST LUMPECTOMY WITH RADIOACTIVE SEED AND SENTINEL LYMPH NODE BIOPSY (Right) as a surgical intervention.  The patient's history has been reviewed, patient examined, no change in status, stable for surgery.  I have reviewed the patient's chart and labs.  Questions were answered to the patient's satisfaction.     [name]

## 2020-08-05 NOTE — Anesthesia Procedure Notes (Signed)
Anesthesia Procedure Image    

## 2020-08-05 NOTE — Anesthesia Procedure Notes (Signed)
Procedure Name: LMA Insertion Date/Time: 08/05/2020 11:07 AM Performed by: Amadeo Garnet, CRNA Pre-anesthesia Checklist: Patient identified, Emergency Drugs available, Suction available and Patient being monitored Patient Re-evaluated:Patient Re-evaluated prior to induction Oxygen Delivery Method: Circle system utilized Preoxygenation: Pre-oxygenation with 100% oxygen Induction Type: IV induction Ventilation: Mask ventilation without difficulty LMA: LMA inserted LMA Size: 5.0 Number of attempts: 1 Placement Confirmation: positive ETCO2 and breath sounds checked- equal and bilateral Tube secured with: Tape Dental Injury: Teeth and Oropharynx as per pre-operative assessment

## 2020-08-06 ENCOUNTER — Encounter (HOSPITAL_COMMUNITY): Payer: Self-pay | Admitting: General Surgery

## 2020-08-08 LAB — SURGICAL PATHOLOGY

## 2020-08-10 ENCOUNTER — Other Ambulatory Visit: Payer: Self-pay | Admitting: Cardiology

## 2020-08-11 ENCOUNTER — Encounter: Payer: Self-pay | Admitting: *Deleted

## 2020-08-15 NOTE — Progress Notes (Signed)
Patient Care Team: Antony Contras, MD as PCP - General (Family Medicine) Leonie Man, MD as PCP - Cardiology (Cardiology) Mauro Kaufmann, RN as Oncology Nurse Navigator Rockwell Germany, RN as Oncology Nurse Navigator  DIAGNOSIS:    ICD-10-CM   1. Malignant neoplasm of upper-outer quadrant of right breast in female, estrogen receptor positive ([place])  C50.411    Z17.0     SUMMARY OF ONCOLOGIC HISTORY: Oncology History  Malignant neoplasm of upper-outer quadrant of right breast in female, estrogen receptor positive ([place])  06/02/2020 Initial Diagnosis   05/20/20 showed a 0.6cm asymmetry in the lateral right breast and a 0.5cm mass at the 12:30 position. Biopsy on 06/02/20 showed invasive ductal carcinoma, grade 1, HER-2 equivocal by IHC, negative by FISH, ER+ 95%, PR+ 95%, Ki67 10%   07/15/2020 Genetic Testing   Negative genetic testing on the common hereditary cancer panel.  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  The Common Hereditary Gene Panel offered by [facility] includes sequencing and/or deletion duplication testing of the following 48 genes: APC, ATM, AXIN2, BARD1, BMPR1A, BRCA1, BRCA2, BRIP1, CDH1, CDK4, CDKN2A (p14ARF), CDKN2A (p16INK4a), CHEK2, CTNNA1, DICER1, EPCAM (Deletion/duplication testing only), GREM1 (promoter region deletion/duplication testing only), KIT, MEN1, MLH1, MSH2, MSH3, MSH6, MUTYH, NBN, NF1, NHTL1, PALB2, PDGFRA, PMS2, POLD1, POLE, PTEN, RAD50, RAD51C, RAD51D, RNF43, SDHB, SDHC, SDHD, SMAD4, SMARCA4. STK11, TP53, TSC1, TSC2, and VHL.  The following genes were evaluated for sequence changes only: SDHA and HOXB13 c.251G>A variant only. The report date is 07/15/2020.   08/05/2020 Surgery   Right lumpectomy Marlou Starks): IDC, grade 1, 0.4cm, clear margins, 2 right axillary lymph nodes negative for carcinoma     CHIEF COMPLIANT: Follow-up s/p right lumpectomy   INTERVAL HISTORY: Samantha Mcbride is a 69 y.o. with above-mentioned history of right breast cancer. She underwent a  right lumpectomy on 08/05/20 with Dr. Marlou Starks for which pathology showed invasive ductal carcinoma, grade 1, 0.4cm, clear margins, 2 right axillary lymph nodes negative for carcinoma. She presents to the clinic today to review the pathology report and discuss further treatment. She tolerated surgery extremely well. She complains of mild soreness under the arm.  ALLERGIES:  has No Known Allergies.  MEDICATIONS:  Current Outpatient Medications  Medication Sig Dispense Refill  . acetaminophen (TYLENOL) 500 MG tablet Take 500 mg by mouth every 6 (six) hours as needed for moderate pain or headache.    . Adalimumab (HUMIRA PEN) 40 MG/0.4ML PNKT Inject 40 mg into the skin every 14 (fourteen) days.     Marland Kitchen atorvastatin (LIPITOR) 20 MG tablet TAKE 1 TABLET (20 MG TOTAL) BY MOUTH AT BEDTIME. *OFFICE VISIT NEEDED* 90 tablet 2  . Calcium Citrate-Vitamin D (CALCIUM + D PO) Take 1 tablet by mouth daily.    . carvedilol (COREG) 12.5 MG tablet Take 12.5 mg by mouth 2 (two) times daily.    . clopidogrel (PLAVIX) 75 MG tablet TAKE 1 TABLET BY MOUTH DAILY (Patient taking differently: Take 75 mg by mouth daily. ) 90 tablet 3  . diclofenac sodium (VOLTAREN) 1 % GEL Apply 2 g topically 4 (four) times daily as needed (pain).     Marland Kitchen DM-APAP-CPM (CORICIDIN HBP PO) Take 1 tablet by mouth daily as needed (allergies).    . hydroxychloroquine (PLAQUENIL) 200 MG tablet Take 1 tablet (200 mg total) by mouth daily.    . Melatonin 5 MG CAPS Take 5 mg by mouth at bedtime.    . nitroGLYCERIN (NITROSTAT) 0.4 MG SL tablet PLACE 1 TABLET  UNDER THE TONGUE EVERY 5 MINUTES AS NEEDED FOR CHEST PAIN. (Patient taking differently: Place 0.4 mg under the tongue every 5 (five) minutes as needed for chest pain. ) 25 tablet 4  . Olopatadine HCl (PATADAY OP) Place 1 drop into both eyes daily as needed (allergies).    Marland Kitchen tiZANidine (ZANAFLEX) 2 MG tablet Take 2 mg by mouth at bedtime.     . traZODone (DESYREL) 100 MG tablet Take 100 mg by mouth at  bedtime.      No current facility-administered medications for this visit.    PHYSICAL EXAMINATION: ECOG PERFORMANCE STATUS: 1 - Symptomatic but completely ambulatory  Vitals:   08/16/20 0843  BP: 123/66  Pulse: 71  Resp: 16  Temp: 98 F (36.7 C)  SpO2: 97%   Filed Weights   08/16/20 0843  Weight: 195 lb 8 oz (88.7 kg)    LABORATORY DATA:  I have reviewed the data as listed CMP Latest Ref Rng & Units 07/28/2020 05/22/2019 05/14/2019  Glucose 70 - 99 mg/dL 97 104(H) 97  BUN 8 - 23 mg/dL 27(H) 22 27(H)  Creatinine 0.44 - 1.00 mg/dL 2.36(H) 2.16(H) 2.36(H)  Sodium 135 - 145 mmol/L 138 142 143  Potassium 3.5 - 5.1 mmol/L 3.6 3.7 3.4(L)  Chloride 98 - 111 mmol/L 106 106 111  CO2 22 - 32 mmol/L 22 28 24   Calcium 8.9 - 10.3 mg/dL 9.0 8.8(L) 8.1(L)  Total Protein 6.5 - 8.1 g/dL - - -  Total Bilirubin 0.3 - 1.2 mg/dL - - -  Alkaline Phos 38 - 126 U/L - - -  AST 15 - 41 U/L - - -  ALT 0 - 44 U/L - - -    Lab Results  Component Value Date   WBC 8.8 07/28/2020   HGB 11.3 (L) 07/28/2020   HCT 36.5 07/28/2020   MCV 91.9 07/28/2020   PLT 222 07/28/2020   NEUTROABS 14.4 (H) 05/13/2019    ASSESSMENT & PLAN:  Malignant neoplasm of upper-outer quadrant of right breast in female, estrogen receptor positive ([place]) 05/20/20 showed a 0.6cm asymmetry in the lateral right breast and a 0.5cm mass at the 12:30 position. Biopsy on 06/02/20 showed invasive ductal carcinoma, grade 1, HER-2 equivocal by IHC, negative by FISH, ER+ 95%, PR+ 95%, Ki67 10%  08/05/2020:Right lumpectomy Marlou Starks): IDC, grade 1, 0.4cm, clear margins, 2 right axillary lymph nodes negative for carcinoma   Pathology counseling: I discussed the final pathology report of the patient provided  a copy of this report. I discussed the margins as well as lymph node surgeries. We also discussed the final staging along with previously performed ER/PR and HER-2/neu testing.   Treatment plan: 1.  Adjuvant radiation therapy 2.  followed by adjuvant antiestrogen therapy with anastrozole daily x5 years  Return to clinic after radiation therapy is complete to start antiestrogens.     No orders of the defined types were placed in this encounter.  The patient has a good understanding of the overall plan. she agrees with it. she will call with any problems that may develop before the next visit here.  Total time spent: 30 mins including face to face time and time spent for planning, charting and coordination of care  Nicholas Lose, MD 08/16/2020  I, Cloyde Reams Dorshimer, am acting as scribe for Dr. Nicholas Lose.  I have reviewed the above documentation for accuracy and completeness, and I agree with the above.

## 2020-08-16 ENCOUNTER — Other Ambulatory Visit: Payer: Self-pay

## 2020-08-16 ENCOUNTER — Inpatient Hospital Stay: Payer: Medicare Other | Attending: Hematology and Oncology | Admitting: Hematology and Oncology

## 2020-08-16 DIAGNOSIS — Z170 Estrogen receptor positive status [ER+]: Secondary | ICD-10-CM

## 2020-08-16 DIAGNOSIS — C50411 Malignant neoplasm of upper-outer quadrant of right female breast: Secondary | ICD-10-CM

## 2020-08-16 NOTE — Assessment & Plan Note (Signed)
05/20/20 showed a 0.6cm asymmetry in the lateral right breast and a 0.5cm mass at the 12:30 position. Biopsy on 06/02/20 showed invasive ductal carcinoma, grade 1, HER-2 equivocal by IHC, negative by FISH, ER+ 95%, PR+ 95%, Ki67 10%  08/05/2020:Right lumpectomy Marlou Starks): IDC, grade 1, 0.4cm, clear margins, 2 right axillary lymph nodes negative for carcinoma   Pathology counseling: I discussed the final pathology report of the patient provided  a copy of this report. I discussed the margins as well as lymph node surgeries. We also discussed the final staging along with previously performed ER/PR and HER-2/neu testing.   Treatment plan: 1.  Adjuvant radiation therapy 2. followed by adjuvant antiestrogen therapy with anastrozole daily x5 years  Return to clinic after radiation therapy is complete to start antiestrogens.

## 2020-08-19 ENCOUNTER — Telehealth: Payer: Self-pay | Admitting: Hematology and Oncology

## 2020-08-19 NOTE — Telephone Encounter (Signed)
No 11/2 los, no changes made to pt schedule  

## 2020-08-31 ENCOUNTER — Ambulatory Visit
Admission: RE | Admit: 2020-08-31 | Discharge: 2020-08-31 | Disposition: A | Discharge status: Home / Self Care | Payer: Medicare Other | Source: Ambulatory Visit | Attending: Radiation Oncology | Admitting: Radiation Oncology

## 2020-08-31 ENCOUNTER — Encounter: Payer: Self-pay | Admitting: Licensed Clinical Social Worker

## 2020-08-31 ENCOUNTER — Encounter: Payer: Self-pay | Admitting: Radiation Oncology

## 2020-08-31 ENCOUNTER — Other Ambulatory Visit: Payer: Self-pay

## 2020-08-31 VITALS — BP 142/69 | HR 69 | Temp 97.6°F | Resp 18 | Ht 61.0 in | Wt 196.6 lb

## 2020-08-31 DIAGNOSIS — N184 Chronic kidney disease, stage 4 (severe): Secondary | ICD-10-CM | POA: Diagnosis not present

## 2020-08-31 DIAGNOSIS — R0602 Shortness of breath: Secondary | ICD-10-CM | POA: Insufficient documentation

## 2020-08-31 DIAGNOSIS — I129 Hypertensive chronic kidney disease with stage 1 through stage 4 chronic kidney disease, or unspecified chronic kidney disease: Secondary | ICD-10-CM | POA: Insufficient documentation

## 2020-08-31 DIAGNOSIS — D509 Iron deficiency anemia, unspecified: Secondary | ICD-10-CM | POA: Diagnosis not present

## 2020-08-31 DIAGNOSIS — Z87891 Personal history of nicotine dependence: Secondary | ICD-10-CM | POA: Diagnosis not present

## 2020-08-31 DIAGNOSIS — C50411 Malignant neoplasm of upper-outer quadrant of right female breast: Secondary | ICD-10-CM | POA: Insufficient documentation

## 2020-08-31 DIAGNOSIS — Z79899 Other long term (current) drug therapy: Secondary | ICD-10-CM | POA: Diagnosis not present

## 2020-08-31 DIAGNOSIS — Z800 Family history of malignant neoplasm of digestive organs: Secondary | ICD-10-CM | POA: Diagnosis not present

## 2020-08-31 DIAGNOSIS — E785 Hyperlipidemia, unspecified: Secondary | ICD-10-CM | POA: Insufficient documentation

## 2020-08-31 DIAGNOSIS — Z803 Family history of malignant neoplasm of breast: Secondary | ICD-10-CM | POA: Insufficient documentation

## 2020-08-31 DIAGNOSIS — Z170 Estrogen receptor positive status [ER+]: Secondary | ICD-10-CM

## 2020-08-31 DIAGNOSIS — I2510 Atherosclerotic heart disease of native coronary artery without angina pectoris: Secondary | ICD-10-CM | POA: Insufficient documentation

## 2020-08-31 DIAGNOSIS — M069 Rheumatoid arthritis, unspecified: Secondary | ICD-10-CM | POA: Diagnosis not present

## 2020-08-31 DIAGNOSIS — I252 Old myocardial infarction: Secondary | ICD-10-CM | POA: Diagnosis not present

## 2020-08-31 NOTE — Progress Notes (Signed)
[facility] Psychosocial Distress Screening Clinical Social Work  Clinical Social Work was referred by distress screening protocol.  The patient scored a 6 on the Psychosocial Distress Thermometer which indicates moderate distress. Clinical Social Worker contacted patient by phone to assess for distress and other psychosocial needs.  Patient's only concern is that she does not know what the cost of treatment will be. CSW encouraged patient to contact her insurance and ask for estimate and also to find out her max out-of-pocket expenses. Discussed foundations that can assist such as [employer] and mailed information to patient today.   ONCBCN DISTRESS SCREENING 08/31/2020  Screening Type Initial Screening  Distress experienced in past week (1-10) 6  Practical problem type Insurance  Emotional problem type   Other     Patient will contact CSW with any questions or for help submitting applications.   Naraly Fritcher E Aran Menning, LCSW

## 2020-08-31 NOTE — Progress Notes (Signed)
Radiation Oncology         [phone] ________________________________  Name: Samantha Mcbride        MRN: [id number]  Date of Service: 08/31/2020 DOB: 04-Dec-1950  MW:NUUVOZ, [name], MD  Nicholas Lose, MD     REFERRING PHYSICIAN: Nicholas Lose, MD   DIAGNOSIS: The encounter diagnosis was Malignant neoplasm of upper-outer quadrant of right breast in female, estrogen receptor positive ([place]).   HISTORY OF PRESENT ILLNESS: Samantha Mcbride is a 69 y.o. female with a recent diagnosis of Right breast cancer. The patient was found on screening mammogram to have a mass in the right breast at approximately 1230.  Further diagnostic imaging measured this area is 5 x 4 x 2 mm, and her axilla was negative for adenopathy.  She underwent a biopsy on 06/02/20 that revealed a grade 1 invasive ductal carcinoma, her tumor was ER/PR positive, HER-2 was negative and Ki-67 was 10%. She underwent lumpectomy and sentinel node biopsy on 08/05/20 which revealed a grade 1, 4 mm invasive ductal carcinoma with clear margins and two negative nodes. She's seen today to discuss options of adjuvant radiotherapy.   PREVIOUS RADIATION THERAPY: No   PAST MEDICAL HISTORY:  Past Medical History:  Diagnosis Date  . Anemia    iron deficiency anemia   . CAD S/P percutaneous coronary angioplasty November 2012   Promus DES - mid LAD 3.5 mm x 24 mm (3.72 mm)   . Chronic kidney disease    stage 4 kidney disease per pt dx 02/2019  . Complication of anesthesia   . Dyslipidemia, goal LDL below 70     on statin, close to goal  . Family history of breast cancer   . Family history of skin cancer   . Former moderate cigarette smoker (10-19 per day)   . Glucose intolerance (impaired glucose tolerance)   . H/O thyroid nodule    Benign  . History of ST elevation myocardial infarction (STEMI) of anterior wall November 2012   With cardiac arrest, 100% mobility occlusion. --> Promus DES   . Hypertension   . PONV (postoperative nausea  and vomiting)    no issues with surgery on 05-11-2019  . Rheumatoid arthritis ([place])    managed on humira   . SOB (shortness of breath) on exertion    reports "its been that way since my heart attack " repots no recurrence of MI sx since that time        PAST SURGICAL HISTORY: Past Surgical History:  Procedure Laterality Date  . ABDOMINAL AORTAGRAM N/A 08/27/2011   Procedure: ABDOMINAL Maxcine Ham;  Surgeon: Troy Sine, MD;  Location: [facility] CATH LAB;  Service: Cardiovascular;  Laterality: N/A;  . ANKLE SURGERY Right 1995   per pt ankle surgery here at [facility]   . APPENDECTOMY    . BREAST EXCISIONAL BIOPSY Left   . BREAST LUMPECTOMY WITH RADIOACTIVE SEED AND SENTINEL LYMPH NODE BIOPSY Right 08/05/2020   Procedure: RIGHT BREAST LUMPECTOMY WITH RADIOACTIVE SEED AND SENTINEL LYMPH NODE BIOPSY;  Surgeon: [name], MD;  Location: [place];  Service: General;  Laterality: Right;  . CYSTOSCOPY WITH RETROGRADE PYELOGRAM, URETEROSCOPY AND STENT PLACEMENT Bilateral 05/11/2019   Procedure: CYSTOSCOPY WITH RETROGRADE PYELOGRAM,  AND STENT PLACEMENT;  Surgeon: Lucas Mallow, MD;  Location: [facility];  Service: Urology;  Laterality: Bilateral;  . CYSTOSCOPY/URETEROSCOPY/HOLMIUM LASER/STENT PLACEMENT Bilateral 05/25/2019   Procedure: CYSTOSCOPY BILATERAL URETEROSCOPY/HOLMIUM LASER/STENT PLACEMENT;  Surgeon: Lucas Mallow, MD;  Location: [facility];  Service: Urology;  Laterality: Bilateral;  . LEFT HEART CATHETERIZATION WITH CORONARY ANGIOGRAM N/A 08/27/2011   Procedure: LEFT HEART CATHETERIZATION WITH CORONARY ANGIOGRAM;  Surgeon: Troy Sine, MD;  Location: [facility] CATH LAB;  Service: Cardiovascular;;For anterior STEMI/cardiac arrest -- 100% mid LAD occlusion  . PERCUTANEOUS CORONARY STENT INTERVENTION (PCI-S) N/A 08/27/2011   Procedure: PERCUTANEOUS CORONARY STENT INTERVENTION (PCI-S);  Surgeon: Troy Sine, MD;  Location: [facility] CATH LAB;  Service: Cardiovascular;;  mid LAD PCI --> Promus Element DES 3.5  mm at 24 mm (3.72 mm)  . TRANSTHORACIC ECHOCARDIOGRAM  November 2012   EF 40-45%, moderate at K. of mid and distal inferior septum and anterior apical myocardium. Grade 1 diastolic function. -- Followup echocardiogram to reassess his EF was denied by insurance company     FAMILY HISTORY:  Family History  Problem Relation Age of Onset  . Hypertension Mother   . Breast cancer Mother 46       early 31's  . Heart failure Father   . Heart attack Paternal Grandfather   . Breast cancer Sister 57  . Skin cancer Sister 23       face     SOCIAL HISTORY:  reports that she quit smoking about 9 years ago. Her smoking use included cigarettes. She has a 40.00 pack-year smoking history. She has never used smokeless tobacco. She reports that she does not drink alcohol and does not use drugs. The patient is single and lives in [place].   ALLERGIES: Patient has no known allergies.   MEDICATIONS:  Current Outpatient Medications  Medication Sig Dispense Refill  . acetaminophen (TYLENOL) 500 MG tablet Take 500 mg by mouth every 6 (six) hours as needed for moderate pain or headache.    . Adalimumab (HUMIRA PEN) 40 MG/0.4ML PNKT Inject 40 mg into the skin every 14 (fourteen) days.     Marland Kitchen atorvastatin (LIPITOR) 20 MG tablet TAKE 1 TABLET (20 MG TOTAL) BY MOUTH AT BEDTIME. *OFFICE VISIT NEEDED* 90 tablet 2  . Calcium Citrate-Vitamin D (CALCIUM + D PO) Take 1 tablet by mouth daily.    . carvedilol (COREG) 12.5 MG tablet Take 12.5 mg by mouth 2 (two) times daily.    . clopidogrel (PLAVIX) 75 MG tablet TAKE 1 TABLET BY MOUTH DAILY (Patient taking differently: Take 75 mg by mouth daily. ) 90 tablet 3  . diclofenac sodium (VOLTAREN) 1 % GEL Apply 2 g topically 4 (four) times daily as needed (pain).     Marland Kitchen DM-APAP-CPM (CORICIDIN HBP PO) Take 1 tablet by mouth daily as needed (allergies).    . hydroxychloroquine (PLAQUENIL) 200 MG tablet Take 1 tablet (200 mg total) by mouth daily.    . Melatonin 5 MG CAPS Take  5 mg by mouth at bedtime.    . Olopatadine HCl (PATADAY OP) Place 1 drop into both eyes daily as needed (allergies).    Marland Kitchen tiZANidine (ZANAFLEX) 2 MG tablet Take 2 mg by mouth at bedtime.     . traZODone (DESYREL) 100 MG tablet Take 100 mg by mouth at bedtime.     . nitroGLYCERIN (NITROSTAT) 0.4 MG SL tablet PLACE 1 TABLET UNDER THE TONGUE EVERY 5 MINUTES AS NEEDED FOR CHEST PAIN. (Patient not taking: Reported on 08/31/2020) 25 tablet 4   No current facility-administered medications for this encounter.     REVIEW OF SYSTEMS: On review of systems, the patient reports that she is doing well overall. She reports that she is doing well overall, she states that  she is healing nicely and saw Dr. Marlou Starks who felt like she was also doing quite well postoperatively.  No other specific complaints are verbalized.    PHYSICAL EXAM:  Wt Readings from Last 3 Encounters:  08/31/20 196 lb 9.6 oz (89.2 kg)  08/16/20 195 lb 8 oz (88.7 kg)  07/28/20 194 lb 11.2 oz (88.3 kg)   Temp Readings from Last 3 Encounters:  08/31/20 97.6 F (36.4 C)  08/16/20 98 F (36.7 C) (Tympanic)  08/05/20 97.9 F (36.6 C)   BP Readings from Last 3 Encounters:  08/31/20 (!) 142/69  08/16/20 123/66  08/05/20 133/78   Pulse Readings from Last 3 Encounters:  08/31/20 69  08/16/20 71  08/05/20 68    In general this is a well appearing caucasian female in no acute distress. She's alert and oriented x4 and appropriate throughout the examination. Cardiopulmonary assessment is negative for acute distress and she exhibits normal effort. The right breast reveals 2 well-healed incisions one at the base of the axilla and the other in the central right breast.  No erythema cellulitic streaking or separation of the incision line is noted.   ECOG = 0  0 - Asymptomatic (Fully active, able to carry on all predisease activities without restriction)  1 - Symptomatic but completely ambulatory (Restricted in physically strenuous  activity but ambulatory and able to carry out work of a light or sedentary nature. For example, light housework, office work)  2 - Symptomatic, <50% in bed during the day (Ambulatory and capable of all self care but unable to carry out any work activities. Up and about more than 50% of waking hours)  3 - Symptomatic, >50% in bed, but not bedbound (Capable of only limited self-care, confined to bed or chair 50% or more of waking hours)  4 - Bedbound (Completely disabled. Cannot carry on any self-care. Totally confined to bed or chair)  5 - Death   Eustace Pen MM, Creech RH, Tormey DC, et al. [phone]). "Toxicity and response criteria of the [facility]". [place] Oncol. 5 (6): 649-55    LABORATORY DATA:  Lab Results  Component Value Date   WBC 8.8 07/28/2020   HGB 11.3 (L) 07/28/2020   HCT 36.5 07/28/2020   MCV 91.9 07/28/2020   PLT 222 07/28/2020   Lab Results  Component Value Date   NA 138 07/28/2020   K 3.6 07/28/2020   CL 106 07/28/2020   CO2 22 07/28/2020   Lab Results  Component Value Date   ALT 15 05/11/2019   AST 16 05/11/2019   ALKPHOS 63 05/11/2019   BILITOT 1.0 05/11/2019      RADIOGRAPHY: NM Sentinel Node Inj-No Rpt (Breast)  Result Date: 08/05/2020 Sulfur colloid was injected by the nuclear medicine technologist for melanoma sentinel node.   MM Breast Surgical Specimen  Result Date: 08/05/2020 CLINICAL DATA:  Evaluate surgical specimen following lumpectomy for RIGHT breast cancer. EXAM: SPECIMEN RADIOGRAPH OF THE RIGHT BREAST COMPARISON:  Previous exam(s). FINDINGS: Status post excision of the RIGHT breast. The radioactive seed and biopsy marker clip are present, completely intact, and were marked for pathology. IMPRESSION: Specimen radiograph of the RIGHT breast. Electronically Signed   By: Margarette Canada M.D.   On: 08/05/2020 12:01   MM RT RADIOACTIVE SEED LOC MAMMO GUIDE  Result Date: 08/02/2020 CLINICAL DATA:  Localization of a right  breast cancer. EXAM: MAMMOGRAPHIC GUIDED RADIOACTIVE SEED LOCALIZATION OF THE RIGHT BREAST COMPARISON:  Previous exam(s). FINDINGS: Patient presents for  radioactive seed localization prior to surgery. I met with the patient and we discussed the procedure of seed localization including benefits and alternatives. We discussed the high likelihood of a successful procedure. We discussed the risks of the procedure including infection, bleeding, tissue injury and further surgery. We discussed the low dose of radioactivity involved in the procedure. Informed, written consent was given. The usual time-out protocol was performed immediately prior to the procedure. Using mammographic guidance, sterile technique, 1% lidocaine and an I-125 radioactive seed, the biopsy clip was localized using a superior approach. The follow-up mammogram images confirm the seed in the expected location and were marked for the surgeon. Follow-up survey of the patient confirms presence of the radioactive seed. Order number of I-125 seed:  [id number]. Total activity:  2.903 millicuries reference Date: June 23, 2020 The patient tolerated the procedure well and was released from the [place]. She was given instructions regarding seed removal. IMPRESSION: Radioactive seed localization right breast. No apparent complications. Electronically Signed   By: Dorise Bullion III M.D   On: 08/02/2020 11:21       IMPRESSION/PLAN: 1.  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 Dr. Lisbeth Renshaw discusses the final pathology findings and reviews the nature of early stage right breast disease. The consensus from the breast conference includes external radiotherapy to the breast followed by antiestrogen therapy. We reviewed the risks, benefits, short, and long term effects of radiotherapy, and the patient is interested in proceeding. Dr. Lisbeth Renshaw discusses the delivery and logistics of radiotherapy and recommends 4 weeks of  radiotherapy. She will return tomorrow for simulation. Written consent is obtained and placed in the chart, a copy was provided to the patient.  2.  Rheumatoid Arthritis. The patient's provider Dr. Jenetta Downer in Rheumatology will follow along with the patient, and she will continue Humira and Plaquenil during radiotherapy.   In a visit lasting 45 minutes, greater than 50% of the time was spent face to face reviewing her case, as well as in preparation of, discussing, and coordinating the patient's care.  The above documentation reflects my direct findings during this shared patient visit. Please see the separate note by Dr. Lisbeth Renshaw on this date for the remainder of the patient's plan of care.    Carola Rhine, PAC

## 2020-09-01 ENCOUNTER — Other Ambulatory Visit: Payer: Self-pay

## 2020-09-01 ENCOUNTER — Ambulatory Visit
Admission: RE | Admit: 2020-09-01 | Discharge: 2020-09-01 | Disposition: A | Discharge status: Home / Self Care | Payer: Medicare Other | Source: Ambulatory Visit | Attending: Radiation Oncology | Admitting: Radiation Oncology

## 2020-09-01 DIAGNOSIS — Z510 Encounter for antineoplastic radiation therapy: Secondary | ICD-10-CM | POA: Insufficient documentation

## 2020-09-01 DIAGNOSIS — C50411 Malignant neoplasm of upper-outer quadrant of right female breast: Secondary | ICD-10-CM | POA: Diagnosis present

## 2020-09-01 DIAGNOSIS — Z170 Estrogen receptor positive status [ER+]: Secondary | ICD-10-CM | POA: Diagnosis present

## 2020-09-06 ENCOUNTER — Encounter: Payer: Self-pay | Admitting: *Deleted

## 2020-09-06 DIAGNOSIS — Z510 Encounter for antineoplastic radiation therapy: Secondary | ICD-10-CM | POA: Diagnosis not present

## 2020-09-09 ENCOUNTER — Telehealth: Payer: Self-pay | Admitting: Hematology and Oncology

## 2020-09-09 NOTE — Telephone Encounter (Signed)
Scheduled appts per 11/23 sch msg. Pt confirmed appt date and time.  

## 2020-09-10 LAB — COMPREHENSIVE METABOLIC PANEL
ALT: 6 IU/L (ref 0–32)
AST: 12 IU/L (ref 0–40)
Albumin/Globulin Ratio: 1.5 (ref 1.2–2.2)
Albumin: 4.1 g/dL (ref 3.8–4.8)
Alkaline Phosphatase: 104 IU/L (ref 44–121)
BUN/Creatinine Ratio: 11 — ABNORMAL LOW (ref 12–28)
BUN: 29 mg/dL — ABNORMAL HIGH (ref 8–27)
Bilirubin Total: 0.3 mg/dL (ref 0.0–1.2)
CO2: 23 mmol/L (ref 20–29)
Calcium: 9.1 mg/dL (ref 8.7–10.3)
Chloride: 103 mmol/L (ref 96–106)
Creatinine, Ser: 2.65 mg/dL — ABNORMAL HIGH (ref 0.57–1.00)
GFR calc Af Amer: 21 mL/min/{1.73_m2} — ABNORMAL LOW (ref >59)
GFR calc non Af Amer: 18 mL/min/{1.73_m2} — ABNORMAL LOW (ref >59)
Globulin, Total: 2.7 g/dL (ref 1.5–4.5)
Glucose: 117 mg/dL — ABNORMAL HIGH (ref 65–99)
Potassium: 3.7 mmol/L (ref 3.5–5.2)
Sodium: 141 mmol/L (ref 134–144)
Total Protein: 6.8 g/dL (ref 6.0–8.5)

## 2020-09-10 LAB — LIPID PANEL
Chol/HDL Ratio: 3.3 ratio (ref 0.0–4.4)
Cholesterol, Total: 156 mg/dL (ref 100–199)
HDL: 47 mg/dL (ref >39)
LDL Chol Calc (NIH): 86 mg/dL (ref 0–99)
Triglycerides: 127 mg/dL (ref 0–149)
VLDL Cholesterol Cal: 23 mg/dL (ref 5–40)

## 2020-09-12 ENCOUNTER — Ambulatory Visit
Admission: RE | Admit: 2020-09-12 | Discharge: 2020-09-12 | Disposition: A | Discharge status: Home / Self Care | Payer: Medicare Other | Source: Ambulatory Visit | Attending: Radiation Oncology | Admitting: Radiation Oncology

## 2020-09-12 DIAGNOSIS — Z510 Encounter for antineoplastic radiation therapy: Secondary | ICD-10-CM | POA: Diagnosis not present

## 2020-09-13 ENCOUNTER — Ambulatory Visit
Admission: RE | Admit: 2020-09-13 | Discharge: 2020-09-13 | Disposition: A | Discharge status: Home / Self Care | Payer: Medicare Other | Source: Ambulatory Visit | Attending: Radiation Oncology | Admitting: Radiation Oncology

## 2020-09-13 DIAGNOSIS — Z510 Encounter for antineoplastic radiation therapy: Secondary | ICD-10-CM | POA: Diagnosis not present

## 2020-09-14 ENCOUNTER — Other Ambulatory Visit: Payer: Self-pay

## 2020-09-14 ENCOUNTER — Ambulatory Visit
Admission: RE | Admit: 2020-09-14 | Discharge: 2020-09-14 | Disposition: A | Discharge status: Home / Self Care | Payer: Medicare Other | Source: Ambulatory Visit | Attending: Radiation Oncology | Admitting: Radiation Oncology

## 2020-09-14 DIAGNOSIS — Z170 Estrogen receptor positive status [ER+]: Secondary | ICD-10-CM | POA: Diagnosis present

## 2020-09-14 DIAGNOSIS — C50411 Malignant neoplasm of upper-outer quadrant of right female breast: Secondary | ICD-10-CM | POA: Diagnosis not present

## 2020-09-15 ENCOUNTER — Other Ambulatory Visit: Payer: Self-pay

## 2020-09-15 ENCOUNTER — Ambulatory Visit
Admission: RE | Admit: 2020-09-15 | Discharge: 2020-09-15 | Disposition: A | Discharge status: Home / Self Care | Payer: Medicare Other | Source: Ambulatory Visit | Attending: Radiation Oncology | Admitting: Radiation Oncology

## 2020-09-15 DIAGNOSIS — C50411 Malignant neoplasm of upper-outer quadrant of right female breast: Secondary | ICD-10-CM | POA: Diagnosis not present

## 2020-09-15 NOTE — Progress Notes (Signed)
Pt here for patient teaching.  Pt given Radiation and You booklet, skin care instructions, Alra deodorant and Radiaplex gel.  Reviewed areas of pertinence such as fatigue, hair loss, skin changes, breast tenderness and breast swelling . Pt able to give teach back of to pat skin and use unscented/gentle soap,apply Radiaplex bid, avoid applying anything to skin within 4 hours of treatment, avoid wearing an under wire bra and to use an electric razor if they must shave. Pt verbalizes understanding of information given and will contact nursing with any questions or concerns.     Samantha Wecker M. Lemond Griffee RN, BSN      

## 2020-09-16 ENCOUNTER — Ambulatory Visit
Admission: RE | Admit: 2020-09-16 | Discharge: 2020-09-16 | Disposition: A | Discharge status: Home / Self Care | Payer: Medicare Other | Source: Ambulatory Visit | Attending: Radiation Oncology | Admitting: Radiation Oncology

## 2020-09-16 DIAGNOSIS — C50411 Malignant neoplasm of upper-outer quadrant of right female breast: Secondary | ICD-10-CM

## 2020-09-16 DIAGNOSIS — Z170 Estrogen receptor positive status [ER+]: Secondary | ICD-10-CM

## 2020-09-16 MED ORDER — RADIAPLEXRX EX GEL: Freq: Once | CUTANEOUS | Status: AC

## 2020-09-16 MED ORDER — ALRA NON-METALLIC DEODORANT (RAD-ONC)
1.0000 "application " | Freq: Once | TOPICAL | Status: AC
  Administered 2020-09-16: 1 via TOPICAL

## 2020-09-19 ENCOUNTER — Ambulatory Visit
Admission: RE | Admit: 2020-09-19 | Discharge: 2020-09-19 | Disposition: A | Discharge status: Home / Self Care | Payer: Medicare Other | Source: Ambulatory Visit | Attending: Radiation Oncology | Admitting: Radiation Oncology

## 2020-09-19 DIAGNOSIS — C50411 Malignant neoplasm of upper-outer quadrant of right female breast: Secondary | ICD-10-CM | POA: Diagnosis not present

## 2020-09-20 ENCOUNTER — Ambulatory Visit
Admission: RE | Admit: 2020-09-20 | Discharge: 2020-09-20 | Disposition: A | Discharge status: Home / Self Care | Payer: Medicare Other | Source: Ambulatory Visit | Attending: Radiation Oncology | Admitting: Radiation Oncology

## 2020-09-20 ENCOUNTER — Other Ambulatory Visit: Payer: Self-pay

## 2020-09-20 DIAGNOSIS — C50411 Malignant neoplasm of upper-outer quadrant of right female breast: Secondary | ICD-10-CM | POA: Diagnosis not present

## 2020-09-21 ENCOUNTER — Other Ambulatory Visit: Payer: Self-pay

## 2020-09-21 ENCOUNTER — Ambulatory Visit
Admission: RE | Admit: 2020-09-21 | Discharge: 2020-09-21 | Disposition: A | Discharge status: Home / Self Care | Payer: Medicare Other | Source: Ambulatory Visit | Attending: Radiation Oncology | Admitting: Radiation Oncology

## 2020-09-21 DIAGNOSIS — C50411 Malignant neoplasm of upper-outer quadrant of right female breast: Secondary | ICD-10-CM | POA: Diagnosis not present

## 2020-09-22 ENCOUNTER — Ambulatory Visit
Admission: RE | Admit: 2020-09-22 | Discharge: 2020-09-22 | Disposition: A | Discharge status: Home / Self Care | Payer: Medicare Other | Source: Ambulatory Visit | Attending: Radiation Oncology | Admitting: Radiation Oncology

## 2020-09-22 DIAGNOSIS — C50411 Malignant neoplasm of upper-outer quadrant of right female breast: Secondary | ICD-10-CM | POA: Diagnosis not present

## 2020-09-23 ENCOUNTER — Ambulatory Visit
Admission: RE | Admit: 2020-09-23 | Discharge: 2020-09-23 | Disposition: A | Discharge status: Home / Self Care | Payer: Medicare Other | Source: Ambulatory Visit | Attending: Radiation Oncology | Admitting: Radiation Oncology

## 2020-09-23 DIAGNOSIS — C50411 Malignant neoplasm of upper-outer quadrant of right female breast: Secondary | ICD-10-CM | POA: Diagnosis not present

## 2020-09-26 ENCOUNTER — Ambulatory Visit
Admission: RE | Admit: 2020-09-26 | Discharge: 2020-09-26 | Disposition: A | Discharge status: Home / Self Care | Payer: Medicare Other | Source: Ambulatory Visit | Attending: Radiation Oncology | Admitting: Radiation Oncology

## 2020-09-26 DIAGNOSIS — C50411 Malignant neoplasm of upper-outer quadrant of right female breast: Secondary | ICD-10-CM | POA: Diagnosis not present

## 2020-09-27 ENCOUNTER — Ambulatory Visit
Admission: RE | Admit: 2020-09-27 | Discharge: 2020-09-27 | Disposition: A | Discharge status: Home / Self Care | Payer: Medicare Other | Source: Ambulatory Visit | Attending: Radiation Oncology | Admitting: Radiation Oncology

## 2020-09-27 DIAGNOSIS — C50411 Malignant neoplasm of upper-outer quadrant of right female breast: Secondary | ICD-10-CM | POA: Diagnosis not present

## 2020-09-28 ENCOUNTER — Other Ambulatory Visit: Payer: Self-pay

## 2020-09-28 ENCOUNTER — Ambulatory Visit
Admission: RE | Admit: 2020-09-28 | Discharge: 2020-09-28 | Disposition: A | Discharge status: Home / Self Care | Payer: Medicare Other | Source: Ambulatory Visit | Attending: Radiation Oncology | Admitting: Radiation Oncology

## 2020-09-28 DIAGNOSIS — C50411 Malignant neoplasm of upper-outer quadrant of right female breast: Secondary | ICD-10-CM | POA: Diagnosis not present

## 2020-09-29 ENCOUNTER — Other Ambulatory Visit: Payer: Self-pay

## 2020-09-29 ENCOUNTER — Ambulatory Visit
Admission: RE | Admit: 2020-09-29 | Discharge: 2020-09-29 | Disposition: A | Discharge status: Home / Self Care | Payer: Medicare Other | Source: Ambulatory Visit | Attending: Radiation Oncology | Admitting: Radiation Oncology

## 2020-09-29 DIAGNOSIS — C50411 Malignant neoplasm of upper-outer quadrant of right female breast: Secondary | ICD-10-CM | POA: Diagnosis not present

## 2020-09-30 ENCOUNTER — Other Ambulatory Visit: Payer: Self-pay

## 2020-09-30 ENCOUNTER — Ambulatory Visit
Admission: RE | Admit: 2020-09-30 | Discharge: 2020-09-30 | Disposition: A | Discharge status: Home / Self Care | Payer: Medicare Other | Source: Ambulatory Visit | Attending: Radiation Oncology | Admitting: Radiation Oncology

## 2020-09-30 ENCOUNTER — Ambulatory Visit: Payer: Medicare Other | Admitting: Radiation Oncology

## 2020-09-30 DIAGNOSIS — C50411 Malignant neoplasm of upper-outer quadrant of right female breast: Secondary | ICD-10-CM | POA: Diagnosis not present

## 2020-10-02 NOTE — Progress Notes (Signed)
 Patient Care Team: Swayne, David, MD as PCP - General (Family Medicine) Harding, David W, MD as PCP - Cardiology (Cardiology) Stuart, Dawn C, RN as Oncology Nurse Navigator Martini, Keisha N, RN as Oncology Nurse Navigator  DIAGNOSIS:    ICD-10-CM   1. Malignant neoplasm of upper-outer quadrant of right breast in female, estrogen receptor positive (HCC)  C50.411    Z17.0     SUMMARY OF ONCOLOGIC HISTORY: Oncology History  Malignant neoplasm of upper-outer quadrant of right breast in female, estrogen receptor positive (HCC)  06/02/2020 Initial Diagnosis   05/20/20 showed a 0.6cm asymmetry in the lateral right breast and a 0.5cm mass at the 12:30 position. Biopsy on 06/02/20 showed invasive ductal carcinoma, grade 1, HER-2 equivocal by IHC, negative by FISH, ER+ 95%, PR+ 95%, Ki67 10%   07/15/2020 Genetic Testing   Negative genetic testing on the common hereditary cancer panel.  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  The Common Hereditary Gene Panel offered by [facility] includes sequencing and/or deletion duplication testing of the following 48 genes: APC, ATM, AXIN2, BARD1, BMPR1A, BRCA1, BRCA2, BRIP1, CDH1, CDK4, CDKN2A (p14ARF), CDKN2A (p16INK4a), CHEK2, CTNNA1, DICER1, EPCAM (Deletion/duplication testing only), GREM1 (promoter region deletion/duplication testing only), KIT, MEN1, MLH1, MSH2, MSH3, MSH6, MUTYH, NBN, NF1, NHTL1, PALB2, PDGFRA, PMS2, POLD1, POLE, PTEN, RAD50, RAD51C, RAD51D, RNF43, SDHB, SDHC, SDHD, SMAD4, SMARCA4. STK11, TP53, TSC1, TSC2, and VHL.  The following genes were evaluated for sequence changes only: SDHA and HOXB13 c.251G>A variant only. The report date is 07/15/2020.   08/05/2020 Surgery   Right lumpectomy ([facility]): IDC, grade 1, 0.4cm, clear margins, 2 right axillary lymph nodes negative for carcinoma     CHIEF COMPLIANT: Follow-up to discuss antiestrogen therapy  INTERVAL HISTORY: Samantha Mcbride is a 69 y.o. with above-mentioned history of right breast cancer who  underwent a right lumpectomy and is currently on radiation. She presents to the clinic today to discuss antiestrogen therapy.  Patient is currently undergoing radiation.  Tolerating it very well.  Denies radiation has been canceled because of mechanical issues with the radiation machine.  ALLERGIES:  has No Known Allergies.  MEDICATIONS:  Current Outpatient Medications  Medication Sig Dispense Refill  . acetaminophen (TYLENOL) 500 MG tablet Take 500 mg by mouth every 6 (six) hours as needed for moderate pain or headache.    . anastrozole (ARIMIDEX) 1 MG tablet Take 1 tablet (1 mg total) by mouth daily. 90 tablet 3  . atorvastatin (LIPITOR) 20 MG tablet TAKE 1 TABLET (20 MG TOTAL) BY MOUTH AT BEDTIME. *OFFICE VISIT NEEDED* 90 tablet 2  . Calcium Citrate-Vitamin D (CALCIUM + D PO) Take 1 tablet by mouth daily.    . carvedilol (COREG) 12.5 MG tablet Take 12.5 mg by mouth 2 (two) times daily.    . clopidogrel (PLAVIX) 75 MG tablet TAKE 1 TABLET BY MOUTH DAILY (Patient taking differently: Take 75 mg by mouth daily. ) 90 tablet 3  . diclofenac sodium (VOLTAREN) 1 % GEL Apply 2 g topically 4 (four) times daily as needed (pain).     . DM-APAP-CPM (CORICIDIN HBP PO) Take 1 tablet by mouth daily as needed (allergies).    . hydroxychloroquine (PLAQUENIL) 200 MG tablet Take 1 tablet (200 mg total) by mouth daily.    . Melatonin 5 MG CAPS Take 5 mg by mouth at bedtime.    . nitroGLYCERIN (NITROSTAT) 0.4 MG SL tablet PLACE 1 TABLET UNDER THE TONGUE EVERY 5 MINUTES AS NEEDED FOR CHEST PAIN. (Patient not taking: Reported   on 08/31/2020) 25 tablet 4  . Olopatadine HCl (PATADAY OP) Place 1 drop into both eyes daily as needed (allergies).    . tiZANidine (ZANAFLEX) 2 MG tablet Take 2 mg by mouth at bedtime.     . traZODone (DESYREL) 100 MG tablet Take 100 mg by mouth at bedtime.      No current facility-administered medications for this visit.    PHYSICAL EXAMINATION: ECOG PERFORMANCE STATUS: 1 - Symptomatic  but completely ambulatory  Vitals:   10/03/20 1337  BP: 128/69  Pulse: 75  Resp: 17  Temp: 98.3 F (36.8 C)  SpO2: 96%   Filed Weights   10/03/20 1337  Weight: 198 lb 9.6 oz (90.1 kg)    BREAST: No palpable masses or nodules in either right or left breasts. No palpable axillary supraclavicular or infraclavicular adenopathy no breast tenderness or nipple discharge. (exam performed in the presence of a chaperone)  LABORATORY DATA:  I have reviewed the data as listed CMP Latest Ref Rng & Units 09/09/2020 07/28/2020 05/22/2019  Glucose 65 - 99 mg/dL 117(H) 97 104(H)  BUN 8 - 27 mg/dL 29(H) 27(H) 22  Creatinine 0.57 - 1.00 mg/dL 2.65(H) 2.36(H) 2.16(H)  Sodium 134 - 144 mmol/L 141 138 142  Potassium 3.5 - 5.2 mmol/L 3.7 3.6 3.7  Chloride 96 - 106 mmol/L 103 106 106  CO2 20 - 29 mmol/L 23 22 28  Calcium 8.7 - 10.3 mg/dL 9.1 9.0 8.8(L)  Total Protein 6.0 - 8.5 g/dL 6.8 - -  Total Bilirubin 0.0 - 1.2 mg/dL 0.3 - -  Alkaline Phos 44 - 121 IU/L 104 - -  AST 0 - 40 IU/L 12 - -  ALT 0 - 32 IU/L 6 - -    Lab Results  Component Value Date   WBC 8.8 07/28/2020   HGB 11.3 (L) 07/28/2020   HCT 36.5 07/28/2020   MCV 91.9 07/28/2020   PLT 222 07/28/2020   NEUTROABS 14.4 (H) 05/13/2019    ASSESSMENT & PLAN:  Malignant neoplasm of upper-outer quadrant of right breast in female, estrogen receptor positive (HCC) 05/20/20 showed a 0.6cm asymmetry in the lateral right breast and a 0.5cm mass at the 12:30 position. Biopsy on 06/02/20 showed invasive ductal carcinoma, grade 1, HER-2 equivocal by IHC, negative by FISH, ER+ 95%, PR+ 95%, Ki67 10%  08/05/2020:Right lumpectomy ([place]): IDC, grade 1, 0.4cm, clear margins, 2 right axillary lymph nodes negative for carcinoma  Treatment plan: 1.  Adjuvant radiation therapy started 09/13/2020 2. followed by adjuvant antiestrogen therapy with anastrozole daily x5 years  Anastrozole counseling:We discussed the risks and benefits of anti-estrogen therapy  with aromatase inhibitors. These include but not limited to insomnia, hot flashes, mood changes, vaginal dryness, bone density loss, and weight gain. We strongly believe that the benefits far outweigh the risks. Patient understands these risks and consented to starting treatment. Planned treatment duration is 7 years.  Return to clinic in 3 months for survivorship care plan visit  No orders of the defined types were placed in this encounter.  The patient has a good understanding of the overall plan. she agrees with it. she will call with any problems that may develop before the next visit here.  Total time spent: 20 mins including face to face time and time spent for planning, charting and coordination of care  Gudena, Vinay, MD 10/03/2020  I, Molly Dorshimer, am acting as scribe for Dr. Vinay Gudena.  I have reviewed the above documentation for accuracy and completeness, and   I agree with the above.

## 2020-10-03 ENCOUNTER — Inpatient Hospital Stay: Payer: Medicare Other | Attending: Hematology and Oncology | Admitting: Hematology and Oncology

## 2020-10-03 ENCOUNTER — Ambulatory Visit: Payer: Medicare Other

## 2020-10-03 ENCOUNTER — Other Ambulatory Visit: Payer: Self-pay

## 2020-10-03 DIAGNOSIS — Z79899 Other long term (current) drug therapy: Secondary | ICD-10-CM | POA: Diagnosis not present

## 2020-10-03 DIAGNOSIS — Z170 Estrogen receptor positive status [ER+]: Secondary | ICD-10-CM

## 2020-10-03 DIAGNOSIS — C50411 Malignant neoplasm of upper-outer quadrant of right female breast: Secondary | ICD-10-CM | POA: Diagnosis not present

## 2020-10-03 MED ORDER — ANASTROZOLE 1 MG PO TABS: 1.0000 mg | ORAL_TABLET | Freq: Every day | ORAL | 3 refills | Status: DC

## 2020-10-03 NOTE — Assessment & Plan Note (Signed)
05/20/20 showed a 0.6cm asymmetry in the lateral right breast and a 0.5cm mass at the 12:30 position. Biopsy on 06/02/20 showed invasive ductal carcinoma, grade 1, HER-2 equivocal by IHC, negative by FISH, ER+ 95%, PR+ 95%, Ki67 10%  08/05/2020:Right lumpectomy Marlou Starks): IDC, grade 1, 0.4cm, clear margins, 2 right axillary lymph nodes negative for carcinoma  Treatment plan: 1.  Adjuvant radiation therapy started 09/13/2020 2. followed by adjuvant antiestrogen therapy with anastrozole daily x5 years  Anastrozole counseling:We discussed the risks and benefits of anti-estrogen therapy with aromatase inhibitors. These include but not limited to insomnia, hot flashes, mood changes, vaginal dryness, bone density loss, and weight gain. We strongly believe that the benefits far outweigh the risks. Patient understands these risks and consented to starting treatment. Planned treatment duration is 7 years.  Return to clinic in 3 months for survivorship care plan visit

## 2020-10-04 ENCOUNTER — Encounter: Payer: Self-pay | Admitting: *Deleted

## 2020-10-04 ENCOUNTER — Ambulatory Visit: Payer: Medicare Other

## 2020-10-04 ENCOUNTER — Ambulatory Visit
Admission: RE | Admit: 2020-10-04 | Discharge: 2020-10-04 | Disposition: A | Discharge status: Home / Self Care | Payer: Medicare Other | Source: Ambulatory Visit | Attending: Radiation Oncology | Admitting: Radiation Oncology

## 2020-10-04 DIAGNOSIS — C50411 Malignant neoplasm of upper-outer quadrant of right female breast: Secondary | ICD-10-CM | POA: Diagnosis not present

## 2020-10-05 ENCOUNTER — Ambulatory Visit
Admission: RE | Admit: 2020-10-05 | Discharge: 2020-10-05 | Disposition: A | Discharge status: Home / Self Care | Payer: Medicare Other | Source: Ambulatory Visit | Attending: Radiation Oncology | Admitting: Radiation Oncology

## 2020-10-05 ENCOUNTER — Ambulatory Visit: Payer: Medicare Other

## 2020-10-05 DIAGNOSIS — C50411 Malignant neoplasm of upper-outer quadrant of right female breast: Secondary | ICD-10-CM | POA: Diagnosis not present

## 2020-10-06 ENCOUNTER — Ambulatory Visit
Admission: RE | Admit: 2020-10-06 | Discharge: 2020-10-06 | Disposition: A | Discharge status: Home / Self Care | Payer: Medicare Other | Source: Ambulatory Visit | Attending: Radiation Oncology | Admitting: Radiation Oncology

## 2020-10-06 DIAGNOSIS — C50411 Malignant neoplasm of upper-outer quadrant of right female breast: Secondary | ICD-10-CM | POA: Diagnosis not present

## 2020-10-10 ENCOUNTER — Ambulatory Visit: Payer: Medicare Other

## 2020-10-11 ENCOUNTER — Ambulatory Visit
Admission: RE | Admit: 2020-10-11 | Discharge: 2020-10-11 | Disposition: A | Discharge status: Home / Self Care | Payer: Medicare Other | Source: Ambulatory Visit | Attending: Radiation Oncology | Admitting: Radiation Oncology

## 2020-10-11 ENCOUNTER — Encounter: Payer: Self-pay | Admitting: *Deleted

## 2020-10-11 ENCOUNTER — Ambulatory Visit: Payer: Medicare Other

## 2020-10-11 DIAGNOSIS — C50411 Malignant neoplasm of upper-outer quadrant of right female breast: Secondary | ICD-10-CM | POA: Diagnosis not present

## 2020-10-12 ENCOUNTER — Ambulatory Visit
Admission: RE | Admit: 2020-10-12 | Discharge: 2020-10-12 | Disposition: A | Discharge status: Home / Self Care | Payer: Medicare Other | Source: Ambulatory Visit | Attending: Radiation Oncology | Admitting: Radiation Oncology

## 2020-10-12 ENCOUNTER — Encounter: Payer: Self-pay | Admitting: Radiation Oncology

## 2020-10-12 DIAGNOSIS — C50411 Malignant neoplasm of upper-outer quadrant of right female breast: Secondary | ICD-10-CM | POA: Diagnosis not present

## 2020-10-18 DIAGNOSIS — N184 Chronic kidney disease, stage 4 (severe): Secondary | ICD-10-CM | POA: Diagnosis not present

## 2020-10-24 DIAGNOSIS — C50919 Malignant neoplasm of unspecified site of unspecified female breast: Secondary | ICD-10-CM | POA: Diagnosis not present

## 2020-10-24 DIAGNOSIS — I129 Hypertensive chronic kidney disease with stage 1 through stage 4 chronic kidney disease, or unspecified chronic kidney disease: Secondary | ICD-10-CM | POA: Diagnosis not present

## 2020-10-24 DIAGNOSIS — M069 Rheumatoid arthritis, unspecified: Secondary | ICD-10-CM | POA: Diagnosis not present

## 2020-10-24 DIAGNOSIS — N184 Chronic kidney disease, stage 4 (severe): Secondary | ICD-10-CM | POA: Diagnosis not present

## 2020-10-24 DIAGNOSIS — D631 Anemia in chronic kidney disease: Secondary | ICD-10-CM | POA: Diagnosis not present

## 2020-10-25 DIAGNOSIS — C50911 Malignant neoplasm of unspecified site of right female breast: Secondary | ICD-10-CM | POA: Diagnosis not present

## 2020-11-07 ENCOUNTER — Other Ambulatory Visit: Payer: Self-pay

## 2020-11-07 ENCOUNTER — Ambulatory Visit
Admission: RE | Admit: 2020-11-07 | Discharge: 2020-11-07 | Disposition: A | Discharge status: Home / Self Care | Payer: Medicare Other | Source: Ambulatory Visit | Attending: Radiation Oncology | Admitting: Radiation Oncology

## 2020-11-07 DIAGNOSIS — M7989 Other specified soft tissue disorders: Secondary | ICD-10-CM | POA: Diagnosis not present

## 2020-11-07 DIAGNOSIS — Z170 Estrogen receptor positive status [ER+]: Secondary | ICD-10-CM | POA: Insufficient documentation

## 2020-11-07 DIAGNOSIS — M79643 Pain in unspecified hand: Secondary | ICD-10-CM | POA: Diagnosis not present

## 2020-11-07 DIAGNOSIS — Z79899 Other long term (current) drug therapy: Secondary | ICD-10-CM | POA: Diagnosis not present

## 2020-11-07 DIAGNOSIS — M810 Age-related osteoporosis without current pathological fracture: Secondary | ICD-10-CM | POA: Diagnosis not present

## 2020-11-07 DIAGNOSIS — N189 Chronic kidney disease, unspecified: Secondary | ICD-10-CM | POA: Diagnosis not present

## 2020-11-07 DIAGNOSIS — C50411 Malignant neoplasm of upper-outer quadrant of right female breast: Secondary | ICD-10-CM | POA: Insufficient documentation

## 2020-11-07 DIAGNOSIS — M25561 Pain in right knee: Secondary | ICD-10-CM | POA: Diagnosis not present

## 2020-11-07 DIAGNOSIS — D649 Anemia, unspecified: Secondary | ICD-10-CM | POA: Diagnosis not present

## 2020-11-07 DIAGNOSIS — M1990 Unspecified osteoarthritis, unspecified site: Secondary | ICD-10-CM | POA: Diagnosis not present

## 2020-11-07 DIAGNOSIS — M0579 Rheumatoid arthritis with rheumatoid factor of multiple sites without organ or systems involvement: Secondary | ICD-10-CM | POA: Diagnosis not present

## 2020-11-07 DIAGNOSIS — M25579 Pain in unspecified ankle and joints of unspecified foot: Secondary | ICD-10-CM | POA: Diagnosis not present

## 2020-11-07 DIAGNOSIS — M549 Dorsalgia, unspecified: Secondary | ICD-10-CM | POA: Diagnosis not present

## 2020-11-07 NOTE — Progress Notes (Signed)
  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  Interval Since Last Radiation: 4 weeks   09/12/20-10/12/20:  The right breast was treated to 42.56 Gy in 16 fractions followed by an 8 Gy boost in 4 fractions.  Narrative:  The patient was contacted today for routine follow-up. During treatment she did very well with radiotherapy and did not have significant desquamation. She reports she had an sense of doom about treatment which has since resolved. She reports that she had almost a sense of grieving or guilt about her disease and that it made her sometimes physically sick to her stomach. She reports her skin is healing up but pigment changes of the skin are still improving.  Impression/Plan: 1. 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 The patient has been doing well since completion of radiotherapy. We discussed that we would be happy to continue to follow her as needed, but she will also continue to follow up with Dr. Lindi Adie in medical oncology. She was counseled on skin care as well as measures to avoid sun exposure to this area.  2. Survivorship. We discussed the importance of survivorship evaluation and encouraged her to attend her upcoming visit with that clinic. 3. Sense of doom. The patient reports her symptoms have improved and declines evaluation with counseling. I encouraged her to reflect on her experience, reach out to [place] if she needs support, or if she changes her mind and would like to see a mental health provider.     Carola Rhine, PAC

## 2020-11-08 ENCOUNTER — Other Ambulatory Visit: Payer: Self-pay

## 2020-11-08 ENCOUNTER — Encounter: Payer: Self-pay | Admitting: Rehabilitation

## 2020-11-08 ENCOUNTER — Ambulatory Visit: Payer: Medicare Other | Attending: General Surgery | Admitting: Rehabilitation

## 2020-11-08 DIAGNOSIS — Z483 Aftercare following surgery for neoplasm: Secondary | ICD-10-CM | POA: Diagnosis not present

## 2020-11-08 DIAGNOSIS — R293 Abnormal posture: Secondary | ICD-10-CM | POA: Diagnosis not present

## 2020-11-08 NOTE — Progress Notes (Signed)
  Radiation Oncology         [phone] ________________________________  Name: Samantha Mcbride MRN: [id number]  Date: 10/12/2020  DOB: 05-18-1951  End of Treatment Note  Diagnosis:   right-sided breast cancer     Indication for treatment:  Curative       Radiation treatment dates:   09/12/20 - 10/12/20  Site/dose:   The patient initially received a dose of 42.56 Gy in 16 fractions to the breast using whole-breast tangent fields. This was delivered using a 3-D conformal technique. The patient then received a boost to the seroma. This delivered an additional 8 Gy in 20fractions using a 3 field photon technique due to the depth of the seroma. The total dose was 50.56 Gy.   Narrative: The patient tolerated radiation treatment relatively well.   The patient had some expected skin irritation as she progressed during treatment.   Plan: The patient has completed radiation treatment. The patient will return to radiation oncology clinic for routine followup in one month. I advised the patient to call or return sooner if they have any questions or concerns related to their recovery or treatment. ________________________________  Jodelle Gross, M.D., Ph.D.

## 2020-11-08 NOTE — Therapy (Signed)
[place], [place], [address] Phone: [phone]   Fax:  [phone]  Physical Therapy Evaluation  Patient Details  Name: Samantha Mcbride MRN: [id number] Date of Birth: [birth year] Referring Provider (PT): Dr. Marlou Starks   Encounter Date: 11/08/2020   PT End of Session - 11/08/20 1522    Visit Number 2    Number of Visits 2    PT Start Time 1500    PT Stop Time 7124    PT Time Calculation (min) 18 min    Activity Tolerance Patient tolerated treatment well    Behavior During Therapy [facility] for tasks assessed/performed           Past Medical History:  Diagnosis Date  . Anemia    iron deficiency anemia   . CAD S/P percutaneous coronary angioplasty November 2012   Promus DES - mid LAD 3.5 mm x 24 mm (3.72 mm)   . Chronic kidney disease    stage 4 kidney disease per pt dx 02/2019  . Complication of anesthesia   . Dyslipidemia, goal LDL below 70     on statin, close to goal  . Family history of breast cancer   . Family history of skin cancer   . Former moderate cigarette smoker (10-19 per day)   . Glucose intolerance (impaired glucose tolerance)   . H/O thyroid nodule    Benign  . History of ST elevation myocardial infarction (STEMI) of anterior wall November 2012   With cardiac arrest, 100% mobility occlusion. --> Promus DES   . Hypertension   . PONV (postoperative nausea and vomiting)    no issues with surgery on 05-11-2019  . Rheumatoid arthritis ([place])    managed on humira   . SOB (shortness of breath) on exertion    reports "its been that way since my heart attack " repots no recurrence of MI sx since that time     Past Surgical History:  Procedure Laterality Date  . ABDOMINAL AORTAGRAM N/A 08/27/2011   Procedure: ABDOMINAL Maxcine Ham;  Surgeon: Troy Sine, MD;  Location: [facility] CATH LAB;  Service: Cardiovascular;  Laterality: N/A;  . ANKLE SURGERY Right 1995   per pt ankle surgery here at [facility]    . APPENDECTOMY    . BREAST EXCISIONAL BIOPSY Left   . BREAST LUMPECTOMY WITH RADIOACTIVE SEED AND SENTINEL LYMPH NODE BIOPSY Right 08/05/2020   Procedure: RIGHT BREAST LUMPECTOMY WITH RADIOACTIVE SEED AND SENTINEL LYMPH NODE BIOPSY;  Surgeon: [name], MD;  Location: [facility];  Service: General;  Laterality: Right;  . CYSTOSCOPY WITH RETROGRADE PYELOGRAM, URETEROSCOPY AND STENT PLACEMENT Bilateral 05/11/2019   Procedure: CYSTOSCOPY WITH RETROGRADE PYELOGRAM,  AND STENT PLACEMENT;  Surgeon: Lucas Mallow, MD;  Location: [facility];  Service: Urology;  Laterality: Bilateral;  . CYSTOSCOPY/URETEROSCOPY/HOLMIUM LASER/STENT PLACEMENT Bilateral 05/25/2019   Procedure: CYSTOSCOPY BILATERAL URETEROSCOPY/HOLMIUM LASER/STENT PLACEMENT;  Surgeon: Lucas Mallow, MD;  Location: [facility];  Service: Urology;  Laterality: Bilateral;  . LEFT HEART CATHETERIZATION WITH CORONARY ANGIOGRAM N/A 08/27/2011   Procedure: LEFT HEART CATHETERIZATION WITH CORONARY ANGIOGRAM;  Surgeon: Troy Sine, MD;  Location: [facility] CATH LAB;  Service: Cardiovascular;;For anterior STEMI/cardiac arrest -- 100% mid LAD occlusion  . PERCUTANEOUS CORONARY STENT INTERVENTION (PCI-S) N/A 08/27/2011   Procedure: PERCUTANEOUS CORONARY STENT INTERVENTION (PCI-S);  Surgeon: Troy Sine, MD;  Location: [facility] CATH LAB;  Service: Cardiovascular;;  mid LAD PCI --> Promus Element DES 3.5 mm at 24 mm (  3.72 mm)  . TRANSTHORACIC ECHOCARDIOGRAM  November 2012   EF 40-45%, moderate at K. of mid and distal inferior septum and anterior apical myocardium. Grade 1 diastolic function. -- Followup echocardiogram to reassess his EF was denied by insurance company    There were no vitals filed for this visit.    Subjective Assessment - 11/08/20 1454    Subjective doing well.  Denies swelling or firmness in the breast or arm.    Pertinent History Rt breast lumpectomy on 08/05/20 with 2 lymph nodes removed both negative.  Completed radiation.  On Anastrozole  x 10/28/20 tolerating well    Limitations --   none   Patient Stated Goals post op check    Currently in Pain? No/denies   occasionally shooting pains in the breast and armpit             [facility] PT Assessment - 11/08/20 0001      Assessment   Medical Diagnosis Rt breast cancer    Referring Provider (PT) Dr. Marlou Starks    Onset Date/Surgical Date 08/05/20    Hand Dominance Right    Prior Therapy preop only      Precautions   Precaution Comments lymphedema Rt UE      Restrictions   Weight Bearing Restrictions No      Balance Screen   Has the patient fallen in the past 6 months No    Has the patient had a decrease in activity level because of a fear of falling?  No    Is the patient reluctant to leave their home because of a fear of falling?  No      Home Environment   Living Environment Private residence    Living Arrangements Alone    Available Help at Discharge Family      Prior Function   Level of [place] Retired      Associate Professor   Overall Cognitive Status Within Functional Limits for tasks assessed      Observation/Other Assessments   Observations lightening of nipple from radiation, no evidence of edema    Skin Integrity 2 well healed incisions      ROM / Strength   AROM / PROM / Strength AROM      AROM   AROM Assessment Site Shoulder    Right/Left Shoulder Right    Right Shoulder Extension 70 Degrees    Right Shoulder Flexion 160 Degrees    Right Shoulder ABduction 167 Degrees    Right Shoulder Internal Rotation 70 Degrees    Right Shoulder External Rotation 70 Degrees             LYMPHEDEMA/ONCOLOGY QUESTIONNAIRE - 11/08/20 0001      Type   Cancer Type Rt breast      Surgeries   Lumpectomy Date 08/05/20    Number Lymph Nodes Removed 2      Treatment   Active Chemotherapy Treatment No    Past Chemotherapy Treatment No    Active Radiation Treatment No    Past Radiation Treatment Yes    Current Hormone Treatment Yes       What other symptoms do you have   Are you Having Heaviness or Tightness No    Are you having Pain No      Lymphedema Assessments   Lymphedema Assessments Upper extremities      Right Upper Extremity Lymphedema   15 cm Proximal to Olecranon Process 39.5 cm    Olecranon Process  28.5 cm    10 cm Proximal to Ulnar Styloid Process 25.3 cm    Just Proximal to Ulnar Styloid Process 17.7 cm    Across Hand at [employer] 19.6 cm    At [place] of 2nd Digit 7.3 cm           L-DEX FLOWSHEETS - 11/08/20 1500      L-DEX LYMPHEDEMA SCREENING   Measurement Type Unilateral    L-DEX MEASUREMENT EXTREMITY Upper Extremity    POSITION  Standing    DOMINANT SIDE Right    At Risk Side Right    BASELINE SCORE (UNILATERAL) 7.4    L-DEX SCORE (UNILATERAL) 3.3    VALUE CHANGE (UNILAT) -4.1                  Objective measurements completed on examination: See above findings.               PT Education - 11/08/20 1522    Education Details lymphedema risk reduction, importance of walking, lymphedema surveillance    Person(s) Educated Patient    Methods Explanation    Comprehension Verbalized understanding               PT Long Term Goals - 11/08/20 1525      PT LONG TERM GOAL #1   Title Pt will return to baseline post op ROM    Status Achieved           Breast Clinic Goals - 11/08/20 1525      Patient will be able to verbalize understanding of pertinent lymphedema risk reduction practices relevant to her diagnosis specifically related to skin care.   Status Achieved      Patient will be able to return demonstrate and/or verbalize understanding of the post-op home exercise program related to regaining shoulder range of motion.   Status Achieved      Patient will be able to verbalize understanding of the importance of attending the postoperative After Breast Cancer Class for further lymphedema risk reduction education and therapeutic exercise.   Status  Achieved                 Plan - 11/08/20 1523    Clinical Impression Statement Pt returns for post -op check now post surgery and radiation x 4 weeks with overall improvements in Rt shoulder ROM and decrease in circumferential measurements and L-Dex and no need for further PT visits.  51month sozo scheduled.    Personal Factors and Comorbidities Comorbidity 2;Age;Fitness    Comorbidities SLNB, radiation    Stability/Clinical Decision Making Stable/Uncomplicated    Clinical Decision Making Low    Rehab Potential Excellent    PT Frequency One time visit    PT Treatment/Interventions Patient/family education    PT Next Visit Plan SOZO 6 month screen    PT Home Exercise Plan post op    Consulted and Agree with Plan of Care Patient           Patient will benefit from skilled therapeutic intervention in order to improve the following deficits and impairments:  Postural dysfunction,Decreased knowledge of precautions  Visit Diagnosis: Abnormal posture  Aftercare following surgery for neoplasm     Problem List Patient Active Problem List   Diagnosis Date Noted  . Exertional dyspnea 07/15/2020  . Genetic testing 07/15/2020  . Preoperative cardiovascular examination 07/11/2020  . Family history of breast cancer   . Family history of skin cancer   . Malignant neoplasm of  upper-outer quadrant of right breast in female, estrogen receptor positive ([place]) 06/15/2020  . Ureteral calculus 05/11/2019  . Obesity (BMI 30-39.9) 07/18/2013  . Essential hypertension   . Dyslipidemia, goal LDL below 70 07/16/2013  . Glucose intolerance (impaired glucose tolerance) 07/16/2013  . Ventricular tachycardia, sustained; Peri-infarct.  No further episodes 08/28/2011  . Hypokalemia 08/27/2011  . H/O Anterior STEMI:  Proximal LAD  insertion of a 3.5x24 mm Promus element DES (08/2011) 08/27/2011  . CAD S/P percutaneous coronary angioplasty 08/16/2011    Stark Bray 11/08/2020, 3:26 PM  [place], [place], [address] Phone: [phone]   Fax:  [phone]  Name: Samantha Mcbride MRN: [id number] Date of Birth: 06-13-1951

## 2020-11-10 ENCOUNTER — Encounter: Payer: Self-pay | Admitting: Cardiology

## 2020-11-10 ENCOUNTER — Other Ambulatory Visit: Payer: Self-pay

## 2020-11-10 ENCOUNTER — Ambulatory Visit (INDEPENDENT_AMBULATORY_CARE_PROVIDER_SITE_OTHER): Payer: Medicare Other | Admitting: Cardiology

## 2020-11-10 VITALS — BP 135/75 | HR 84 | Ht 61.0 in | Wt 193.8 lb

## 2020-11-10 DIAGNOSIS — E1169 Type 2 diabetes mellitus with other specified complication: Secondary | ICD-10-CM

## 2020-11-10 DIAGNOSIS — I10 Essential (primary) hypertension: Secondary | ICD-10-CM | POA: Diagnosis not present

## 2020-11-10 DIAGNOSIS — Z9861 Coronary angioplasty status: Secondary | ICD-10-CM | POA: Diagnosis not present

## 2020-11-10 DIAGNOSIS — I2510 Atherosclerotic heart disease of native coronary artery without angina pectoris: Secondary | ICD-10-CM

## 2020-11-10 DIAGNOSIS — E785 Hyperlipidemia, unspecified: Secondary | ICD-10-CM

## 2020-11-10 DIAGNOSIS — I472 Ventricular tachycardia, unspecified: Secondary | ICD-10-CM

## 2020-11-10 DIAGNOSIS — I2109 ST elevation (STEMI) myocardial infarction involving other coronary artery of anterior wall: Secondary | ICD-10-CM | POA: Diagnosis not present

## 2020-11-10 DIAGNOSIS — E669 Obesity, unspecified: Secondary | ICD-10-CM

## 2020-11-10 MED ORDER — EZETIMIBE 10 MG PO TABS: 10.0000 mg | ORAL_TABLET | Freq: Every day | ORAL | 3 refills | Status: DC

## 2020-11-10 MED ORDER — ROSUVASTATIN CALCIUM 20 MG PO TABS: 20.0000 mg | ORAL_TABLET | Freq: Every day | ORAL | 3 refills | Status: DC

## 2020-11-10 NOTE — Progress Notes (Signed)
Primary Care Provider: Antony Contras, MD Cardiologist: Glenetta Hew, MD Electrophysiologist: None  Clinic Note: Chief Complaint  Patient presents with   Follow-up    4 months.  Doing well.  Had a breast cancer surgery and radiation therapy.   Coronary Artery Disease    No angina   ===================================  ASSESSMENT/PLAN   Problem List Items Addressed This Visit    H/O Anterior STEMI:  Proximal LAD  insertion of a 3.5x24 mm Promus element DES (08/2011) - Primary (Chronic)    Initial presentation was VF arrest-requiring CPR and emergency room on arrival.  She has not any further episodes since then.  She does not even recall ever having any anginal pain. She does have a reduced EF of 40 to 45%, but has not had any heart failure symptoms.  We did recheck her echocardiogram and is the same.      Relevant Medications   rosuvastatin (CRESTOR) 20 MG tablet   ezetimibe (ZETIA) 10 MG tablet   CAD S/P percutaneous coronary angioplasty (Chronic)    Relatively large mid LAD DES stent placed in the setting of MI.  No further angina.  Plan:  Continue carvedilol.  Not on ACE inhibitor/ARB with renal insufficiency  She is on 20 mL atorvastatin, not able to tolerate any more than that.  Lipids not quite at goal.-We will switch to rosuvastatin same dose.  Remains on maintenance dose clopidogrel-okay to hold 5 days preop for surgery procedures.      Relevant Medications   rosuvastatin (CRESTOR) 20 MG tablet   ezetimibe (ZETIA) 10 MG tablet   Other Relevant Orders   Hepatic function panel   Lipid panel   Ventricular tachycardia, sustained; Peri-infarct.  No further episodes (Chronic)    Initial presentation with ischemic VT-in the setting of MI.  No further episodes since PCI. Moderately reduced EF, no further arrhythmias. Continue beta-blocker.      Relevant Medications   rosuvastatin (CRESTOR) 20 MG tablet   ezetimibe (ZETIA) 10 MG tablet   Hyperlipidemia  associated with type 2 diabetes mellitus (HCC) (Chronic)    LDL not quite at goal.  She is supposed to be on Repatha, but that did not get started.  Probably now inclisiran for recent cancer, she is not interested in changing.  Plan: Convert from atorvastatin to rosuvastatin 20 milligrams, and add Zetia 10 mg   recheck lipids in July      Relevant Medications   rosuvastatin (CRESTOR) 20 MG tablet   ezetimibe (ZETIA) 10 MG tablet   Other Relevant Orders   Hepatic function panel   Lipid panel   Essential hypertension (Chronic)    Her blood pressure is borderline today, she said it is better than that at home.  We will simply continue current dose of carvedilol.  No ACE inhibitor/ARB because renal insufficiency.      Relevant Medications   rosuvastatin (CRESTOR) 20 MG tablet   ezetimibe (ZETIA) 10 MG tablet   Obesity (BMI 30-39.9) (Chronic)    The patient understands the need to lose weight with diet and exercise. We have discussed specific strategies for this.      Relevant Orders   Hepatic function panel   Lipid panel     ===================================  HPI:    Samantha Mcbride is a 70 y.o. female with a PMH below who presents today for 15-month follow-up.  PMH notable for CAD (anterior STEMI complicated by cardiac arrest-LAD PCI), CKD-4, HTN, HLD, Rheumatoid Arthritis, and BreastCA  Samantha  Launa Mcbride was last seen on 07/11/2020 for preop evaluation for breast cancer surgery.  Not very active.  Left hip bothers her with walking.  No angina.  As of yet not taking Repatha.  Recent Hospitalizations:   08/05/2020: Right breast lumpectomy, sentinel node biopsy, with radioactive seed implant She was told that she does not require chemotherapy, is only doing XRT.   Reviewed  CV studies:    The following studies were reviewed today: (if available, images/films reviewed: From Epic Chart or Care Everywhere)  Echo 07/20/2020: EF 40 to 45% (similar to 2012) stable wall motion  normality (severe HK of midapical anterior apical wall-consistent with prior infarct).  GRII DD.  Normal valves. => Stable compared to 08/30/2011.  EF was still 40 to 45%.   Interval History:   [name] returns today overall quite stable from a cardiac standpoint.  She has been paratrooper through her breast cancer therapy.  She had her surgery and has done her radiation treatments.  She is gradually started to get back into her routine.  It did not really necessarily set her back by much.  However, she just has been a little more sedentary than usual.  She says that she is limited as far as walking the concern knee and hip pain bilaterally. She is still deconditioned and has exertional dyspnea with increased level of activity.  No longer have any chest pain or pressure.  With everything going on the last several months, she never did start.  She is reluctant to try any new medications right now while she is on her cancer medications.  CV Review of Symptoms (Summary) Cardiovascular ROS: positive for - dyspnea on exertion and this Is mostly because of deconditioning.  Not very active. negative for - chest pain, edema, irregular heartbeat, orthopnea, palpitations, paroxysmal nocturnal dyspnea, rapid heart rate, shortness of breath or Lightheadedness dizziness, syncope/near syncope or TIA/amaurosis fugax, claudication   CV Review of Symptoms (Summary): positive for - Intermittent strange episodes of chest pain that seems to be coming from the back.  She does have exertional dyspnea from being the deconditioned.  Not very active negative for - edema, irregular heartbeat, orthopnea, paroxysmal nocturnal dyspnea, rapid heart rate, shortness of breath or Syncope/near syncope, TIA/almost fugax, claudication   The patient does not have symptoms concerning for COVID-19 infection (fever, chills, cough, or new shortness of breath).   REVIEWED OF SYSTEMS   Review of Systems  Constitutional: Positive  for malaise/fatigue (Does not have a lot of energy.  Better now that she is finishing up her radiation therapy). Negative for weight loss.  HENT: Negative for congestion and nosebleeds.   Respiratory: Positive for shortness of breath (When she overdoes things). Negative for cough.   Gastrointestinal: Negative for blood in stool and melena.  Genitourinary: Negative for hematuria.  Musculoskeletal: Positive for joint pain (Hips and knees).  Neurological: Negative for dizziness (Sometimes positional), focal weakness and weakness.  Psychiatric/Behavioral: Negative for depression and memory loss. The patient does not have insomnia.     I have reviewed and (if needed) personally updated the patient's problem list, medications, allergies, past medical and surgical history, social and family history.   PAST MEDICAL HISTORY   Past Medical History:  Diagnosis Date   Anemia    iron deficiency anemia    CAD S/P percutaneous coronary angioplasty November 2012   Promus DES - mid LAD 3.5 mm x 24 mm (3.72 mm)    Chronic kidney disease  stage 4 kidney disease per pt dx 01/980   Complication of anesthesia    Dyslipidemia, goal LDL below 70     on statin, close to goal   Family history of breast cancer    Family history of skin cancer    Former moderate cigarette smoker (10-19 per day)    Glucose intolerance (impaired glucose tolerance)    H/O thyroid nodule    Benign   History 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 --> Promus DES =>  EF 40 to 45% (similar to 2012) stable wall motion normality (severe HK of midapical anterior apical wall-consistent with prior infarct).  GRII DD.  Normal valves. => Stable compared to 08/30/2011.  EF was still 40 to 45%.   Hypertension    PONV (postoperative nausea and vomiting)    no issues with surgery on 05-11-2019   Rheumatoid arthritis ([place])    managed on humira     SOB (shortness of breath) on exertion    reports "its been that way since my heart attack " repots no recurrence of MI sx since that time     PAST SURGICAL HISTORY   Past Surgical History:  Procedure Laterality Date   ABDOMINAL AORTAGRAM N/A 08/27/2011   Procedure: ABDOMINAL Maxcine Ham;  Surgeon: Troy Sine, MD;  [facility] C[facility];  Service: Cardiovascular;  Laterality: N/A;   ANKLE SURGERY Right 1995   per pt ankle surgery here at [facility] EXCISIONAL BIOPSY Left    BREAST LUMPECTOMY WITH RADIOACTIVE SEED AND SENTINEL LYMPH NODE BIOPSY Right 08/05/2020   Procedure: RIGHT BREAST LUMPECTOMY WITH RADIOACTIVE SEED AND SENTINEL LYMPH NODE BIOPSY;  Surgeon: [name], MD;  Location: [facility];  Service: General;  Laterality: Right;   CYSTOSCOPY WITH RETROGRADE PYELOGRAM, URETEROSCOPY AND STENT PLACEMENT Bilateral 05/11/2019   Procedure: CYSTOSCOPY WITH RETROGRADE PYELOGRAM,  AND STENT PLACEMENT;  Surgeon: Lucas Mallow, MD;  Location: [facility];  Service: Urology;  Laterality: Bilateral;   CYSTOSCOPY/URETEROSCOPY/HOLMIUM LASER/STENT PLACEMENT Bilateral 05/25/2019   Procedure: CYSTOSCOPY BILATERAL URETEROSCOPY/HOLMIUM LASER/STENT PLACEMENT;  Surgeon: Lucas Mallow, MD;  Location: [facility];  Service: Urology;  Laterality: Bilateral;   LEFT HEART CATHETERIZATION WITH CORONARY ANGIOGRAM N/A 08/27/2011   Procedure: LEFT HEART CATHETERIZATION WITH CORONARY ANGIOGRAM;  Surgeon: Troy Sine, MD;  Location: [facility] CATH LAB;  Service: Cardiovascular;;For anterior STEMI/cardiac arrest -- 100% mid LAD occlusion   PERCUTANEOUS CORONARY STENT INTERVENTION (PCI-S) N/A 08/27/2011   Procedure: PERCUTANEOUS CORONARY STENT INTERVENTION (PCI-S);  Surgeon: Troy Sine, MD;  Location: [facility] CATH LAB;  Service: Cardiovascular;;  mid LAD PCI --> Promus Element DES 3.5 mm at 24 mm (3.72 mm)   TRANSTHORACIC ECHOCARDIOGRAM  08/2011   EF 40-45%, moderate at K. of mid and distal  inferior septum and anterior apical myocardium. Grade 1 diastolic function. -- Followup echocardiogram to reassess his EF was denied by insurance company   TRANSTHORACIC ECHOCARDIOGRAM  07/20/2020    EF 40 to 45% (similar to 2012) stable wall motion normality (severe HK of midapical anterior apical wall-consistent with prior infarct).  GRII DD.  Normal valves. => Stable compared to 08/30/2011.  EF was still 40 to 45%.    Immunization History  Administered Date(s) Administered   Influenza, High Dose Seasonal PF 06/15/2017   Influenza,inj,Quad PF,6+ Mos 06/25/2015   Influenza,trivalent, recombinat, inj, PF 06/12/2018   PFIZER(Purple Top)SARS-COV-2 Vaccination 11/20/2019,  12/15/2019, 06/14/2020   Pneumococcal Polysaccharide-23 08/28/2011, 05/12/2019    MEDICATIONS/ALLERGIES   Current Meds  Medication Sig   acetaminophen (TYLENOL) 500 MG tablet Take 500 mg by mouth every 6 (six) hours as needed for moderate pain or headache.   anastrozole (ARIMIDEX) 1 MG tablet Take 1 tablet (1 mg total) by mouth daily.   Calcium Citrate-Vitamin D (CALCIUM + D PO) Take 1 tablet by mouth daily.   carvedilol (COREG) 12.5 MG tablet Take 12.5 mg by mouth 2 (two) times daily.   diclofenac sodium (VOLTAREN) 1 % GEL Apply 2 g topically 4 (four) times daily as needed (pain).    DM-APAP-CPM (CORICIDIN HBP PO) Take 1 tablet by mouth daily as needed (allergies).   ezetimibe (ZETIA) 10 MG tablet Take 1 tablet (10 mg total) by mouth daily.   hydroxychloroquine (PLAQUENIL) 200 MG tablet Take 1 tablet (200 mg total) by mouth daily.   leflunomide (ARAVA) 10 MG tablet 1 tablet   Melatonin 5 MG CAPS Take 5 mg by mouth at bedtime.   nitroGLYCERIN (NITROSTAT) 0.4 MG SL tablet PLACE 1 TABLET UNDER THE TONGUE EVERY 5 MINUTES AS NEEDED FOR CHEST PAIN.   Olopatadine HCl (PATADAY OP) Place 1 drop into both eyes daily as needed (allergies).   rosuvastatin (CRESTOR) 20 MG tablet Take 1 tablet (20 mg total) by mouth  daily.   tiZANidine (ZANAFLEX) 2 MG tablet Take 2 mg by mouth at bedtime.   traZODone (DESYREL) 100 MG tablet Take 100 mg by mouth at bedtime.    [DISCONTINUED] atorvastatin (LIPITOR) 20 MG tablet TAKE 1 TABLET (20 MG TOTAL) BY MOUTH AT BEDTIME. *OFFICE VISIT NEEDED*   [DISCONTINUED] clopidogrel (PLAVIX) 75 MG tablet TAKE 1 TABLET BY MOUTH DAILY (Patient taking differently: Take 75 mg by mouth daily.)    No Known Allergies  SOCIAL HISTORY/FAMILY HISTORY   Reviewed in Epic:  Pertinent findings:  Social History   Tobacco Use   Smoking status: Former Smoker    Packs/day: 1.00    Years: 40.00    Pack years: 40.00    Types: Cigarettes    Quit date: 08/27/2011    Years since quitting: 9.2   Smokeless tobacco: Never Used  Substance Use Topics   Alcohol use: No   Drug use: No   Social History   Social History Narrative   Single mother of one, grandmother of 2. She works for [employer] for [employer].   She quit smoking time of her MI in November 2012. Does not take alcohol.   She had been doing really well he exercises at least 3-4 days a week, but really get discouraged that she is not been able to drop weight.    OBJCTIVE -PE, EKG, labs   Wt Readings from Last 3 Encounters:  11/10/20 193 lb 12.8 oz (87.9 kg)  10/03/20 198 lb 9.6 oz (90.1 kg)  08/31/20 196 lb 9.6 oz (89.2 kg)    Physical Exam: BP 135/75    Pulse 84    Ht 5\' 1"  (1.549 m)    Wt 193 lb 12.8 oz (87.9 kg)    SpO2 93%    BMI 36.62 kg/m She is at home her blood pressure recordings are usually in the 120-130/70-80 mmHg range. Physical Exam Vitals reviewed.  Constitutional:      General: She is not in acute distress.    Appearance: Normal appearance. She is not ill-appearing or toxic-appearing.     Comments: Moderately obese.  Well-groomed.  Healthy-appearing.  HENT:  Head: Normocephalic and atraumatic.  Neck:     Vascular: No carotid bruit, hepatojugular reflux or JVD.   Cardiovascular:     Rate and Rhythm: Normal rate and regular rhythm.  No extrasystoles are present.    Chest Wall: PMI is not displaced.     Pulses: Normal pulses and intact distal pulses.     Heart sounds: S1 normal and S2 normal. Heart sounds are distant. No murmur heard. No friction rub. No gallop.   Pulmonary:     Effort: Pulmonary effort is normal. No respiratory distress.     Breath sounds: Normal breath sounds.  Chest:     Chest wall: No tenderness.  Musculoskeletal:        General: Swelling (Trivial bilateral ankle) present. Normal range of motion.     Cervical back: Normal range of motion and neck supple.  Neurological:     General: No focal deficit present.     Mental Status: She is alert and oriented to person, place, and time.     Motor: No weakness.     Gait: Gait normal.  Psychiatric:        Mood and Affect: Mood normal.        Behavior: Behavior normal.        Thought Content: Thought content normal.        Judgment: Judgment normal.     Comments: In good spirits.      Adult ECG Report Not checked  Recent Labs: Reviewed Lab Results  Component Value Date   CHOL 156 09/09/2020   HDL 47 09/09/2020   LDLCALC 86 09/09/2020   TRIG 127 09/09/2020   CHOLHDL 3.3 09/09/2020   Lab Results  Component Value Date   CREATININE 2.65 (H) 09/09/2020   BUN 29 (H) 09/09/2020   NA 141 09/09/2020   K 3.7 09/09/2020   CL 103 09/09/2020   CO2 23 09/09/2020   CBC Latest Ref Rng & Units 07/28/2020 05/22/2019 05/14/2019  WBC 4.0 - 10.5 K/uL 8.8 9.9 12.4(H)  Hemoglobin 12.0 - 15.0 g/dL 11.3(L) 9.6(L) 8.4(L)  Hematocrit 36.0 - 46.0 % 36.5 31.0(L) 27.6(L)  Platelets 150 - 400 K/uL 222 251 174    No results found for: TSH  ==================================================  COVID-19 Education: The signs and symptoms of COVID-19 were discussed with the patient and how to seek care for testing (follow up with PCP or arrange E-visit).   The importance of social distancing and  COVID-19 vaccination was discussed today. The patient is practicing social distancing & Masking.   I spent a total of 38minutes with the patient spent in direct patient consultation.  Additional time spent with chart review  / charting (studies, outside notes, etc): 15 min Total Time: 38 min   Current medicines are reviewed at length with the patient today.  (+/- concerns) n/a  This visit occurred during the SARS-CoV-2 public health emergency.  Safety protocols were in place, including screening questions prior to the visit, additional usage of staff PPE, and extensive cleaning of exam room while observing appropriate contact time as indicated for disinfecting solutions.  Notice: This dictation was prepared with Dragon dictation along with smaller phrase technology. Any transcriptional errors that result from this process are unintentional and may not be corrected upon review.  Patient Instructions / Medication Changes & Studies & Tests Ordered   Patient Instructions  Medication Instructions:   Stop taking Atorvastatin  Start taking Rosuvastatin 20 mg one tablet at bedtime zetia 10 mg one tablet daily    *  If you need a refill on your cardiac medications before your next appointment, please call your pharmacy*   Lab Work: Labs in 6 month  Fasting ( July)  Lipid Liver function panel  If you have labs (blood work) drawn today and your tests are completely normal, you will receive your results only by:  MyChart Message (if you have MyChart) OR  A paper copy in the mail If you have any lab test that is abnormal or we need to change your treatment, we will call you to review the results.   Testing/Procedures:  Not needed  Follow-Up: At [facility], you and your health needs are our priority.  As part of our continuing mission to provide you with exceptional heart care, we have created designated Provider Care Teams.  These Care Teams include your primary Cardiologist  (physician) and Advanced Practice Providers (APPs -  Physician Assistants and Nurse Practitioners) who all work together to provide you with the care you need, when you need it.     Your next appointment:   6 month(s)  The format for your next appointment:   In Person  Provider:   Glenetta Hew, MD      Studies Ordered:   Orders Placed This Encounter  Procedures   Hepatic function panel   Lipid panel     Glenetta Hew, M.D., M.S. Interventional Cardiologist   Pager # [phone] Phone # [phone] [address] [place], [address] [address]   Thank you for choosing Heartcare at [facility]!!

## 2020-11-10 NOTE — Patient Instructions (Signed)
Medication Instructions:   Stop taking Atorvastatin  Start taking Rosuvastatin 20 mg one tablet at bedtime zetia 10 mg one tablet daily    *If you need a refill on your cardiac medications before your next appointment, please call your pharmacy*   Lab Work: Labs in 6 month  Fasting ( July)  Lipid Liver function panel  If you have labs (blood work) drawn today and your tests are completely normal, you will receive your results only by: Marland Kitchen MyChart Message (if you have MyChart) OR . A paper copy in the mail If you have any lab test that is abnormal or we need to change your treatment, we will call you to review the results.   Testing/Procedures:  Not needed  Follow-Up: At [facility], you and your health needs are our priority.  As part of our continuing mission to provide you with exceptional heart care, we have created designated Provider Care Teams.  These Care Teams include your primary Cardiologist (physician) and Advanced Practice Providers (APPs -  Physician Assistants and Nurse Practitioners) who all work together to provide you with the care you need, when you need it.     Your next appointment:   6 month(s)  The format for your next appointment:   In Person  Provider:   Glenetta Hew, MD

## 2020-11-17 DIAGNOSIS — C50911 Malignant neoplasm of unspecified site of right female breast: Secondary | ICD-10-CM | POA: Diagnosis not present

## 2020-11-18 ENCOUNTER — Other Ambulatory Visit: Payer: Self-pay | Admitting: Cardiology

## 2020-11-20 ENCOUNTER — Encounter: Payer: Self-pay | Admitting: Cardiology

## 2020-11-20 NOTE — Assessment & Plan Note (Signed)
Initial presentation was VF arrest-requiring CPR and emergency room on arrival.  She has not any further episodes since then.  She does not even recall ever having any anginal pain. She does have a reduced EF of 40 to 45%, but has not had any heart failure symptoms.  We did recheck her echocardiogram and is the same.

## 2020-11-20 NOTE — Assessment & Plan Note (Signed)
The patient understands the need to lose weight with diet and exercise. We have discussed specific strategies for this.  

## 2020-11-20 NOTE — Assessment & Plan Note (Signed)
LDL not quite at goal.  She is supposed to be on Repatha, but that did not get started.  Probably now inclisiran for recent cancer, she is not interested in changing.  Plan: Convert from atorvastatin to rosuvastatin 20 milligrams, and add Zetia 10 mg   recheck lipids in July

## 2020-11-20 NOTE — Assessment & Plan Note (Addendum)
Relatively large mid LAD DES stent placed in the setting of MI.  No further angina.  Plan:  Continue carvedilol.  Not on ACE inhibitor/ARB with renal insufficiency  She is on 20 mL atorvastatin, not able to tolerate any more than that.  Lipids not quite at goal.-We will switch to rosuvastatin same dose.  Remains on maintenance dose clopidogrel-okay to hold 5 days preop for surgery procedures.

## 2020-11-20 NOTE — Assessment & Plan Note (Signed)
Initial presentation with ischemic VT-in the setting of MI.  No further episodes since PCI. Moderately reduced EF, no further arrhythmias. Continue beta-blocker.

## 2020-11-20 NOTE — Assessment & Plan Note (Signed)
Her blood pressure is borderline today, she said it is better than that at home.  We will simply continue current dose of carvedilol.  No ACE inhibitor/ARB because renal insufficiency.

## 2020-12-05 DIAGNOSIS — M0579 Rheumatoid arthritis with rheumatoid factor of multiple sites without organ or systems involvement: Secondary | ICD-10-CM | POA: Diagnosis not present

## 2020-12-05 DIAGNOSIS — Z79899 Other long term (current) drug therapy: Secondary | ICD-10-CM | POA: Diagnosis not present

## 2020-12-13 ENCOUNTER — Other Ambulatory Visit: Payer: Self-pay | Admitting: Family Medicine

## 2020-12-13 DIAGNOSIS — Z9889 Other specified postprocedural states: Secondary | ICD-10-CM

## 2020-12-19 ENCOUNTER — Telehealth: Payer: Self-pay | Admitting: Adult Health

## 2020-12-19 NOTE — Telephone Encounter (Signed)
Rescheduled SCP visit per LC schedule change. Pt confirmed cancellation and new appt date/time.

## 2021-01-02 ENCOUNTER — Encounter: Payer: Medicare Other | Admitting: Adult Health

## 2021-01-05 ENCOUNTER — Inpatient Hospital Stay: Payer: Medicare Other | Attending: Hematology and Oncology | Admitting: Adult Health

## 2021-01-05 ENCOUNTER — Encounter: Payer: Self-pay | Admitting: Adult Health

## 2021-01-05 ENCOUNTER — Telehealth: Payer: Self-pay | Admitting: Hematology and Oncology

## 2021-01-05 ENCOUNTER — Other Ambulatory Visit: Payer: Self-pay

## 2021-01-05 VITALS — BP 116/62 | HR 78 | Temp 97.7°F | Resp 18 | Ht 61.0 in | Wt 192.4 lb

## 2021-01-05 DIAGNOSIS — Z122 Encounter for screening for malignant neoplasm of respiratory organs: Secondary | ICD-10-CM | POA: Diagnosis not present

## 2021-01-05 DIAGNOSIS — Z79899 Other long term (current) drug therapy: Secondary | ICD-10-CM | POA: Diagnosis not present

## 2021-01-05 DIAGNOSIS — Z87891 Personal history of nicotine dependence: Secondary | ICD-10-CM | POA: Diagnosis not present

## 2021-01-05 DIAGNOSIS — Z79811 Long term (current) use of aromatase inhibitors: Secondary | ICD-10-CM | POA: Diagnosis not present

## 2021-01-05 DIAGNOSIS — C50411 Malignant neoplasm of upper-outer quadrant of right female breast: Secondary | ICD-10-CM | POA: Diagnosis not present

## 2021-01-05 DIAGNOSIS — Z803 Family history of malignant neoplasm of breast: Secondary | ICD-10-CM | POA: Insufficient documentation

## 2021-01-05 DIAGNOSIS — M069 Rheumatoid arthritis, unspecified: Secondary | ICD-10-CM | POA: Insufficient documentation

## 2021-01-05 DIAGNOSIS — I10 Essential (primary) hypertension: Secondary | ICD-10-CM | POA: Diagnosis not present

## 2021-01-05 DIAGNOSIS — Z170 Estrogen receptor positive status [ER+]: Secondary | ICD-10-CM | POA: Insufficient documentation

## 2021-01-05 DIAGNOSIS — Z923 Personal history of irradiation: Secondary | ICD-10-CM | POA: Insufficient documentation

## 2021-01-05 DIAGNOSIS — Z8249 Family history of ischemic heart disease and other diseases of the circulatory system: Secondary | ICD-10-CM | POA: Insufficient documentation

## 2021-01-05 NOTE — Progress Notes (Signed)
SURVIVORSHIP  VISIT:   BRIEF ONCOLOGIC HISTORY:  Oncology History  Malignant neoplasm of upper-outer quadrant of right breast in female, estrogen receptor positive (Samantha Mcbride)  06/02/2020 Initial Diagnosis   05/20/20 showed a 0.6cm asymmetry in the lateral right breast and a 0.5cm mass at the 12:30 position. Biopsy on 06/02/20 showed invasive ductal carcinoma, grade 1, HER-2 equivocal by IHC, negative by FISH, ER+ 95%, PR+ 95%, Ki67 10%   07/15/2020 Genetic Testing   Negative genetic testing on the common hereditary cancer panel.  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  The Common Hereditary Gene Panel offered by [facility] includes sequencing and/or deletion duplication testing of the following 48 genes: APC, ATM, AXIN2, BARD1, BMPR1A, BRCA1, BRCA2, BRIP1, CDH1, CDK4, CDKN2A (p14ARF), CDKN2A (p16INK4a), CHEK2, CTNNA1, DICER1, EPCAM (Deletion/duplication testing only), GREM1 (promoter region deletion/duplication testing only), KIT, MEN1, MLH1, MSH2, MSH3, MSH6, MUTYH, NBN, NF1, NHTL1, PALB2, PDGFRA, PMS2, POLD1, POLE, PTEN, RAD50, RAD51C, RAD51D, RNF43, SDHB, SDHC, SDHD, SMAD4, SMARCA4. STK11, TP53, TSC1, TSC2, and VHL.  The following genes were evaluated for sequence changes only: SDHA and HOXB13 c.251G>A variant only. The report date is 07/15/2020.   08/05/2020 Surgery   Right lumpectomy Samantha Mcbride) [phone]): IDC, grade 1, 0.4cm, clear margins, 2 right axillary lymph nodes negative for carcinoma   09/12/2020 - 10/12/2020 Radiation Therapy   The patient initially received a dose of 42.56 Gy in 16 fractions to the breast using whole-breast tangent fields. This was delivered using a 3-D conformal technique. The pt received a boost delivering an additional 8 Gy in 4 fractions using a electron boost with 78mV electrons. The total dose was 50.56 Gy.   09/2020 - 09/2025 Anti-estrogen oral therapy   Anastrozole     INTERVAL HISTORY:  Samantha Mcbride review her survivorship care plan detailing her treatment course  for breast cancer, as well as monitoring long-term side effects of that treatment, education regarding health maintenance, screening, and overall wellness and health promotion.     Overall, Samantha Mcbride reports feeling quite well.  She is taking the Anastrozole daily and is tolerating it quite well.  She does have some achiness in her legs and she applies magnesium cream on it to help.  She otherwise is feeling well.    REVIEW OF SYSTEMS:  Review of Systems  Constitutional: Negative for appetite change, chills, fatigue, fever and unexpected weight change.  HENT:   Negative for hearing loss, lump/mass and trouble swallowing.   Eyes: Negative for eye problems and icterus.  Respiratory: Negative for chest tightness, cough and shortness of breath.   Cardiovascular: Negative for chest pain, leg swelling and palpitations.  Gastrointestinal: Negative for abdominal distention, abdominal pain, constipation, diarrhea, nausea and vomiting.  Endocrine: Negative for hot flashes.  Genitourinary: Negative for difficulty urinating.   Musculoskeletal: Positive for arthralgias.  Skin: Negative for itching and rash.  Neurological: Negative for dizziness, extremity weakness, headaches and numbness.  Hematological: Negative for adenopathy. Does not bruise/bleed easily.  Psychiatric/Behavioral: Negative for depression. The patient is not nervous/anxious.   Breast: Denies any new nodularity, masses, tenderness, nipple changes, or nipple discharge.    ONCOLOGY TREATMENT TEAM:  1. Surgeon[facility]  [facility] 2. Medical Oncologist: Dr. GLindi Adie 3. Radiation Oncologist: Dr. MLisbeth Renshaw   PAST MEDICAL/SURGICAL HISTORY:  Past Medical History:  Diagnosis Date  . Anemia    iron deficiency anemia   . CAD S/P percutaneous coronary angioplasty November 2012   Promus DES - mid LAD 3.5 mm x 24 mm (  3.72 mm)   . Chronic kidney disease    stage 4 kidney disease per pt dx 02/2019  . Complication of  anesthesia   . Dyslipidemia, goal LDL below 70     on statin, close to goal  . Family history of breast cancer   . Family history of skin cancer   . Former moderate cigarette smoker (10-19 per day)   . Glucose intolerance (impaired glucose tolerance)   . H/O thyroid nodule    Benign  . 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 --> Promus DES =>  EF 40 to 45% (similar to 2012) stable wall motion normality (severe HK of midapical anterior apical wall-consistent with prior infarct).  GRII DD.  Normal valves. => Stable compared to 08/30/2011.  EF was still 40 to 45%.  . Hypertension   . PONV (postoperative nausea and vomiting)    no issues with surgery on 05-11-2019  . Rheumatoid arthritis ([place])    managed on humira   . SOB (shortness of breath) on exertion    reports "its been that way since my heart attack " repots no recurrence of MI sx since that time    Past Surgical History:  Procedure Laterality Date  . ABDOMINAL AORTAGRAM N/A 08/27/2011   Procedure: ABDOMINAL Maxcine Ham;  Surgeon: Troy Sine, MD;  Location: [facility] CATH LAB;  Service: Cardiovascular;  Laterality: N/A;  . ANKLE SURGERY Right 1995   per pt ankle surgery [facility] at [facility]   . APPENDECTOMY    . BREAST EXCISIONAL BIOPSY Left   . BREAST LUMPECTOMY WITH RADIOACTIVE SEED AND SENTINEL LYMPH NODE BIOPSY Right 08/05/2020   Procedure: RIGHT BREAST LUMPECTOMY WITH RADIOACTIVE SEED AND SENTINEL LYMPH NODE BIOPSY;  Surgeon: [name], MD;  Location: [facility];  Service: General;  Laterality: Right;  . CYSTOSCOPY WITH RETROGRADE PYELOGRAM, URETEROSCOPY AND STENT PLACEMENT Bilateral 05/11/2019   Procedure: CYSTOSCOPY WITH RETROGRADE PYELOGRAM,  AND STENT PLACEMENT;  Surgeon: Lucas Mallow, MD;  Location: [facility] ORS;  Service: Urology;  Laterality: Bilateral;  . CYSTOSCOPY/URETEROSCOPY/HOLMIUM LASER/STENT PLACEMENT Bilateral 05/25/2019   Procedure:  CYSTOSCOPY BILATERAL URETEROSCOPY/HOLMIUM LASER/STENT PLACEMENT;  Surgeon: Lucas Mallow, MD;  Location: [facility];  Service: Urology;  Laterality: Bilateral;  . LEFT HEART CATHETERIZATION WITH CORONARY ANGIOGRAM N/A 08/27/2011   Procedure: LEFT HEART CATHETERIZATION WITH CORONARY ANGIOGRAM;  Surgeon: Troy Sine, MD;  Location: [facility] CATH LAB;  Service: Cardiovascular;;For anterior STEMI/cardiac arrest -- 100% mid LAD occlusion  . PERCUTANEOUS CORONARY STENT INTERVENTION (PCI-S) N/A 08/27/2011   Procedure: PERCUTANEOUS CORONARY STENT INTERVENTION (PCI-S);  Surgeon: Troy Sine, MD;  Location: [facility] CATH LAB;  Service: Cardiovascular;;  mid LAD PCI --> Promus Element DES 3.5 mm at 24 mm (3.72 mm)  . TRANSTHORACIC ECHOCARDIOGRAM  08/2011   EF 40-45%, moderate at K. of mid and distal inferior septum and anterior apical myocardium. Grade 1 diastolic function. -- Followup echocardiogram to reassess his EF was denied by insurance company  . TRANSTHORACIC ECHOCARDIOGRAM  07/20/2020    EF 40 to 45% (similar to 2012) stable wall motion normality (severe HK of midapical anterior apical wall-consistent with prior infarct).  GRII DD.  Normal valves. => Stable compared to 08/30/2011.  EF was still 40 to 45%.     ALLERGIES:  No Known Allergies   CURRENT MEDICATIONS:  Outpatient Encounter Medications as of 01/05/2021  Medication Sig  . acetaminophen (TYLENOL) 500 MG tablet Take  500 mg by mouth every 6 (six) hours as needed for moderate pain or headache.  . anastrozole (ARIMIDEX) 1 MG tablet Take 1 tablet (1 mg total) by mouth daily.  . Calcium Citrate-Vitamin D (CALCIUM + D PO) Take 1 tablet by mouth daily.  . carvedilol (COREG) 12.5 MG tablet Take 12.5 mg by mouth 2 (two) times daily.  . clopidogrel (PLAVIX) 75 MG tablet TAKE 1 TABLET BY MOUTH DAILY  . diclofenac sodium (VOLTAREN) 1 % GEL Apply 2 g topically 4 (four) times daily as needed (pain).   Marland Kitchen DM-APAP-CPM (CORICIDIN HBP PO) Take 1 tablet by mouth  daily as needed (allergies).  . ezetimibe (ZETIA) 10 MG tablet Take 1 tablet (10 mg total) by mouth daily.  . hydroxychloroquine (PLAQUENIL) 200 MG tablet Take 1 tablet (200 mg total) by mouth daily.  Marland Kitchen leflunomide (ARAVA) 10 MG tablet 20 mg.  . Melatonin 5 MG CAPS Take 5 mg by mouth at bedtime.  . nitroGLYCERIN (NITROSTAT) 0.4 MG SL tablet PLACE 1 TABLET UNDER THE TONGUE EVERY 5 MINUTES AS NEEDED FOR CHEST PAIN.  Marland Kitchen Olopatadine HCl (PATADAY OP) Place 1 drop into both eyes daily as needed (allergies).  . rosuvastatin (CRESTOR) 20 MG tablet Take 1 tablet (20 mg total) by mouth daily.  Marland Kitchen tiZANidine (ZANAFLEX) 2 MG tablet Take 2 mg by mouth at bedtime.  . traZODone (DESYREL) 100 MG tablet Take 100 mg by mouth at bedtime.    No facility-administered encounter medications on file as of 01/05/2021.     ONCOLOGIC FAMILY HISTORY:  Family History  Problem Relation Age of Onset  . Hypertension Mother   . Breast cancer Mother 90       early 71's  . Heart failure Father   . Heart attack Paternal Grandfather   . Breast cancer Sister 67  . Skin cancer Sister 55       face     GENETIC COUNSELING/TESTING: See above  SOCIAL HISTORY:  Social History   Socioeconomic History  . Marital status: Single    Spouse name: Not on file  . Number of children: Not on file  . Years of education: Not on file  . Highest education level: Not on file  Occupational History  . Not on file  Tobacco Use  . Smoking status: Former Smoker    Packs/day: 1.00    Years: 40.00    Pack years: 40.00    Types: Cigarettes    Quit date: 08/27/2011    Years since quitting: 9.3  . Smokeless tobacco: Never Used  Substance and Sexual Activity  . Alcohol use: No  . Drug use: No  . Sexual activity: Not on file  Other Topics Concern  . Not on file  Social History Narrative   Single mother of one, grandmother of 2. She works for [employer] for [employer].   She quit smoking time of her MI in  November 2012. Does not take alcohol.   She had been doing really well he exercises at least 3-4 days a week, but really get discouraged that she is not been able to drop weight.   Social Determinants of Health   Financial Resource Strain: Not on file  Food Insecurity: Not on file  Transportation Needs: Not on file  Physical Activity: Not on file  Stress: Not on file  Social Connections: Not on file  Intimate Partner Violence: Not on file     OBSERVATIONS/OBJECTIVE:  BP 116/62 (BP Location: Left Arm, Patient Position:  Sitting)   Pulse 78   Temp 97.7 F (36.5 C) (Tympanic)   Resp 18   Ht 5' 1"  (1.549 m)   Wt 192 lb 6.4 oz (87.3 kg)   SpO2 95%   BMI 36.35 kg/m  GENERAL: Patient is a well appearing female in no acute distress HEENT:  Sclerae anicteric. m. Neck is supple.  NODES:  No cervical, supraclavicular, or axillary lymphadenopathy palpated.  BREAST EXAM:  Right breast s/p lumpectomy and radiation, no sign of local recurrence, left breast s/p lumpectomy from a clogged duct, the nipple remains inverted, no sign of local recurrence LUNGS:  Clear to auscultation bilaterally.  No wheezes or rhonchi. HEART:  Regular rate and rhythm. No murmur appreciated. ABDOMEN:  Soft, nontender.  Positive, normoactive bowel sounds. No organomegaly palpated. MSK:  No focal spinal tenderness to palpation. Full range of motion bilaterally in the upper extremities. EXTREMITIES:  No peripheral edema.   SKIN:  Clear with no obvious rashes or skin changes. No nail dyscrasia. NEURO:  Nonfocal. Well oriented.  Appropriate affect.    LABORATORY DATA:  None for this visit.  DIAGNOSTIC IMAGING:  None for this visit.      ASSESSMENT AND PLAN:  Ms.. Mcbride is a pleasant 70 y.o. female with Stage IA right breast invasive ductal carcinoma, ER+/PR+/HER2-, diagnosed in 05/2020, treated with lumpectomy, adjuvant radiation therapy, and anti-estrogen therapy with Anastrozole beginning in 09/2020.  She  presents to the Survivorship Clinic for our initial meeting and routine follow-up post-completion of treatment for breast cancer.    1. Stage IA right breast cancer:  Samantha Mcbride is continuing to recover from definitive treatment for breast cancer. She will follow-up with her medical oncologist, Dr. Lindi Adie in 6 months with history and physical exam per surveillance protocol.  She will continue her anti-estrogen therapy with Anastrozole. Thus far, she is tolerating the Anastrozole well, with minimal side effects. She was instructed to make Dr. Lindi Adie or myself aware if she begins to experience any worsening side effects of the medication and I could see her back in clinic to help manage those side effects, as needed. Her mammogram is due 05/2021; orders placed today. Today, a comprehensive survivorship care plan and treatment summary was reviewed with the patient today detailing her breast cancer diagnosis, treatment course, potential late/long-term effects of treatment, appropriate follow-up care with recommendations for the future, and patient education resources.  A copy of this summary, along with a letter will be sent to the patient's primary care provider via mail/fax/In Basket message after today's visit.    2. Bone health:  Given Samantha Mcbride's age/history of breast cancer and her current treatment regimen including anti-estrogen therapy with Anastrozole, she is at risk for bone demineralization.  Her last DEXA scan was 12/2019, which showed osteoporosis.  We talked about bone health in detail as she was recommended to forego bisphosphanate therapy by her PCP.  She was given education on specific activities to promote bone health.  3. Cancer screening:  Due to Samantha Mcbride's history and her age, she should receive screening for skin cancers, colon cancer, and gynecologic cancers.  I placed orders for CT lung cancer screening as she has a 30 pack year tobacco history and quit 10 years ago.  The information  and recommendations are listed on the patient's comprehensive care plan/treatment summary and were reviewed in detail with the patient.    4. Health maintenance and wellness promotion: Samantha Mcbride was encouraged to consume 5-7 servings of fruits and  vegetables per day. We reviewed the "Nutrition Rainbow" handout, as well as the handout "Take Control of Your Health and Reduce Your Cancer Risk" from the [place].  She was also encouraged to engage in moderate to vigorous exercise for 30 minutes per day most days of the week. We discussed the [employer] fitness program, which is designed for cancer survivors to help them become more physically fit after cancer treatments.  She was instructed to limit her alcohol consumption and continue to abstain from tobacco use.     5. Support services/counseling: It is not uncommon for this period of the patient's cancer care trajectory to be one of many emotions and stressors.  We discussed how this can be increasingly difficult during the times of quarantine and social distancing due to the COVID-19 pandemic.   She was given information regarding our available services and encouraged to contact me with any questions or for help enrolling in any of our support group/programs.    Follow up instructions:    -Return to cancer center in 6 months for f/u with Dr. Lindi Adie  -Mammogram due in 05/2021 -Follow up with surgery 02/2021 and 12/2021 -She is welcome to return back to the Survivorship Clinic at any time; no additional follow-up needed at this time.  -Consider referral back to survivorship as a long-term survivor for continued surveillance  The patient was provided an opportunity to ask questions and all were answered. The patient agreed with the plan and demonstrated an understanding of the instructions.   Total encounter time: 50 minutes*  Wilber Bihari, NP 01/05/21 12:32 PM Medical Oncology and Hematology [facility] [place], [place] [address] Tel. [phone]    Fax. [phone]  *Total Encounter Time as defined by the Centers for Medicare and Medicaid Services includes, in addition to the face-to-face time of a patient visit (documented in the note above) non-face-to-face time: obtaining and reviewing outside history, ordering and reviewing medications, tests or procedures, care coordination (communications with other health care professionals or caregivers) and documentation in the medical record.

## 2021-01-05 NOTE — Telephone Encounter (Signed)
Scheduled appt per 3/24 los. Pt aware.  

## 2021-01-11 ENCOUNTER — Ambulatory Visit: Payer: Medicare Other

## 2021-01-16 ENCOUNTER — Ambulatory Visit (HOSPITAL_COMMUNITY)
Admission: RE | Admit: 2021-01-16 | Discharge: 2021-01-16 | Disposition: A | Discharge status: Home / Self Care | Payer: Medicare Other | Source: Ambulatory Visit | Attending: Adult Health | Admitting: Adult Health

## 2021-01-16 ENCOUNTER — Other Ambulatory Visit: Payer: Self-pay

## 2021-01-16 ENCOUNTER — Encounter (HOSPITAL_COMMUNITY): Payer: Self-pay

## 2021-01-16 DIAGNOSIS — I2510 Atherosclerotic heart disease of native coronary artery without angina pectoris: Secondary | ICD-10-CM | POA: Diagnosis not present

## 2021-01-16 DIAGNOSIS — J432 Centrilobular emphysema: Secondary | ICD-10-CM | POA: Diagnosis not present

## 2021-01-16 DIAGNOSIS — I700 Atherosclerosis of aorta: Secondary | ICD-10-CM | POA: Diagnosis not present

## 2021-01-16 DIAGNOSIS — Z122 Encounter for screening for malignant neoplasm of respiratory organs: Secondary | ICD-10-CM | POA: Insufficient documentation

## 2021-01-16 DIAGNOSIS — Z87891 Personal history of nicotine dependence: Secondary | ICD-10-CM | POA: Diagnosis not present

## 2021-01-19 ENCOUNTER — Telehealth: Payer: Self-pay

## 2021-01-19 NOTE — Telephone Encounter (Signed)
-----   Message from Gardenia Phlegm, NP sent at 01/18/2021  9:22 AM EDT ----- No lung cancer on scan, please notify patient.  Please forward results to her PCP to f/u about the aortic atherosclerosis and COPD if indicated ----- Message ----- From: Interface, Rad Results In Sent: 01/18/2021   6:48 AM EDT To: Gardenia Phlegm, NP

## 2021-01-19 NOTE — Telephone Encounter (Signed)
Called pt to inform of results per Wilber Bihari, NP. Pt verbalized thanks and understanding. Faxed results to Dr Moreen Fowler & Dr Ellyn Hack. Fax conf. Received.

## 2021-01-24 ENCOUNTER — Other Ambulatory Visit (HOSPITAL_BASED_OUTPATIENT_CLINIC_OR_DEPARTMENT_OTHER): Payer: Self-pay

## 2021-01-24 ENCOUNTER — Ambulatory Visit: Payer: Medicare Other | Attending: Internal Medicine

## 2021-01-24 ENCOUNTER — Other Ambulatory Visit: Payer: Self-pay

## 2021-01-24 DIAGNOSIS — Z23 Encounter for immunization: Secondary | ICD-10-CM

## 2021-01-24 NOTE — Progress Notes (Signed)
   Covid-19 Vaccination Clinic  Name:  Samantha Mcbride    MRN: [id number] DOB: [birth year]  01/24/2021  [name] was observed post Covid-19 immunization for 15 minutes without incident. She was provided with Vaccine Information Sheet and instruction to access the V-Safe system.   [name] was instructed to call 911 with any severe reactions post vaccine: Marland Kitchen Difficulty breathing  . Swelling of face and throat  . A fast heartbeat  . A bad rash all over body  . Dizziness and weakness   Immunizations Administered    Name Date Dose VIS Date Route   PFIZER Comrnaty(Gray TOP) Covid-19 Vaccine 01/24/2021  1:14 PM 0.3 mL 09/22/2020 Intramuscular   Manufacturer: [employer]   Lot: [id number]   NDC: [phone]

## 2021-01-27 ENCOUNTER — Other Ambulatory Visit (HOSPITAL_BASED_OUTPATIENT_CLINIC_OR_DEPARTMENT_OTHER): Payer: Self-pay

## 2021-01-27 MED ORDER — COVID-19 MRNA VACCINE (PFIZER) 30 MCG/0.3ML IM SUSP
INTRAMUSCULAR | 0 refills | Status: DC
  Filled 2021-01-27: qty 0.3, 1d supply, fill #0

## 2021-02-02 ENCOUNTER — Ambulatory Visit: Payer: Medicare Other | Admitting: Podiatry

## 2021-02-02 ENCOUNTER — Encounter: Payer: Self-pay | Admitting: Podiatry

## 2021-02-02 ENCOUNTER — Other Ambulatory Visit: Payer: Self-pay

## 2021-02-02 DIAGNOSIS — G4700 Insomnia, unspecified: Secondary | ICD-10-CM | POA: Insufficient documentation

## 2021-02-02 DIAGNOSIS — E559 Vitamin D deficiency, unspecified: Secondary | ICD-10-CM | POA: Insufficient documentation

## 2021-02-02 DIAGNOSIS — M109 Gout, unspecified: Secondary | ICD-10-CM | POA: Diagnosis not present

## 2021-02-02 DIAGNOSIS — M810 Age-related osteoporosis without current pathological fracture: Secondary | ICD-10-CM | POA: Insufficient documentation

## 2021-02-02 DIAGNOSIS — M069 Rheumatoid arthritis, unspecified: Secondary | ICD-10-CM | POA: Insufficient documentation

## 2021-02-02 DIAGNOSIS — G2581 Restless legs syndrome: Secondary | ICD-10-CM | POA: Insufficient documentation

## 2021-02-02 DIAGNOSIS — N184 Chronic kidney disease, stage 4 (severe): Secondary | ICD-10-CM | POA: Insufficient documentation

## 2021-02-02 DIAGNOSIS — G5782 Other specified mononeuropathies of left lower limb: Secondary | ICD-10-CM

## 2021-02-02 DIAGNOSIS — M5430 Sciatica, unspecified side: Secondary | ICD-10-CM | POA: Insufficient documentation

## 2021-02-02 DIAGNOSIS — G5762 Lesion of plantar nerve, left lower limb: Secondary | ICD-10-CM | POA: Diagnosis not present

## 2021-02-02 DIAGNOSIS — M19049 Primary osteoarthritis, unspecified hand: Secondary | ICD-10-CM | POA: Insufficient documentation

## 2021-02-02 DIAGNOSIS — M8580 Other specified disorders of bone density and structure, unspecified site: Secondary | ICD-10-CM | POA: Insufficient documentation

## 2021-02-02 DIAGNOSIS — I2510 Atherosclerotic heart disease of native coronary artery without angina pectoris: Secondary | ICD-10-CM | POA: Insufficient documentation

## 2021-02-02 DIAGNOSIS — E782 Mixed hyperlipidemia: Secondary | ICD-10-CM | POA: Insufficient documentation

## 2021-02-02 DIAGNOSIS — M19072 Primary osteoarthritis, left ankle and foot: Secondary | ICD-10-CM | POA: Diagnosis not present

## 2021-02-02 MED ORDER — TRIAMCINOLONE ACETONIDE 40 MG/ML IJ SUSP
20.0000 mg | Freq: Once | INTRAMUSCULAR | Status: AC
  Administered 2021-02-02: 20 mg

## 2021-02-05 NOTE — Progress Notes (Signed)
Subjective:  Patient ID: Samantha Mcbride, female    DOB: 11-17-1950,  MRN: [id number] HPI Chief Complaint  Patient presents with  . Toe Pain    2nd toe left - toenail thick and discolored, nail fell off about a month ago and has been red around it ever since, using Vicks VapoRub on it  . New Patient (Initial Visit)    70 y.o. female presents with the above complaint.   ROS: Denies fever chills nausea vomiting muscle aches pains calf pain back pain chest pain shortness of breath.  Past Medical History:  Diagnosis Date  . Anemia    iron deficiency anemia   . CAD S/P percutaneous coronary angioplasty November 2012   Promus DES - mid LAD 3.5 mm x 24 mm (3.72 mm)   . Chronic kidney disease    stage 4 kidney disease per pt dx 02/2019  . Complication of anesthesia   . Dyslipidemia, goal LDL below 70     on statin, close to goal  . Family history of breast cancer   . Family history of skin cancer   . Former moderate cigarette smoker (10-19 per day)   . Glucose intolerance (impaired glucose tolerance)   . H/O thyroid nodule    Benign  . 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 --> Promus DES =>  EF 40 to 45% (similar to 2012) stable wall motion normality (severe HK of midapical anterior apical wall-consistent with prior infarct).  GRII DD.  Normal valves. => Stable compared to 08/30/2011.  EF was still 40 to 45%.  . Hypertension   . PONV (postoperative nausea and vomiting)    no issues with surgery on 05-11-2019  . Rheumatoid arthritis ([place])    managed on humira   . SOB (shortness of breath) on exertion    reports "its been that way since my heart attack " repots no recurrence of MI sx since that time    Past Surgical History:  Procedure Laterality Date  . ABDOMINAL AORTAGRAM N/A 08/27/2011   Procedure: ABDOMINAL Maxcine Ham;  Surgeon: Troy Sine, MD;  Location: [facility] CATH LAB;  Service:  Cardiovascular;  Laterality: N/A;  . ANKLE SURGERY Right 1995   per pt ankle surgery here at [facility]   . APPENDECTOMY    . BREAST EXCISIONAL BIOPSY Left   . BREAST LUMPECTOMY WITH RADIOACTIVE SEED AND SENTINEL LYMPH NODE BIOPSY Right 08/05/2020   Procedure: RIGHT BREAST LUMPECTOMY WITH RADIOACTIVE SEED AND SENTINEL LYMPH NODE BIOPSY;  Surgeon: [name], MD;  Location: [place];  Service: General;  Laterality: Right;  . CYSTOSCOPY WITH RETROGRADE PYELOGRAM, URETEROSCOPY AND STENT PLACEMENT Bilateral 05/11/2019   Procedure: CYSTOSCOPY WITH RETROGRADE PYELOGRAM,  AND STENT PLACEMENT;  Surgeon: Lucas Mallow, MD;  Location: [facility];  Service: Urology;  Laterality: Bilateral;  . CYSTOSCOPY/URETEROSCOPY/HOLMIUM LASER/STENT PLACEMENT Bilateral 05/25/2019   Procedure: CYSTOSCOPY BILATERAL URETEROSCOPY/HOLMIUM LASER/STENT PLACEMENT;  Surgeon: Lucas Mallow, MD;  Location: [facility];  Service: Urology;  Laterality: Bilateral;  . LEFT HEART CATHETERIZATION WITH CORONARY ANGIOGRAM N/A 08/27/2011   Procedure: LEFT HEART CATHETERIZATION WITH CORONARY ANGIOGRAM;  Surgeon: Troy Sine, MD;  Location: [facility] CATH LAB;  Service: Cardiovascular;;For anterior STEMI/cardiac arrest -- 100% mid LAD occlusion  . PERCUTANEOUS CORONARY STENT INTERVENTION (PCI-S) N/A 08/27/2011   Procedure: PERCUTANEOUS CORONARY STENT INTERVENTION (PCI-S);  Surgeon: Troy Sine, MD;  Location: [facility] CATH LAB;  Service: Cardiovascular;;  mid LAD PCI --> Promus Element DES 3.5 mm at 24 mm (3.72 mm)  . TRANSTHORACIC ECHOCARDIOGRAM  08/2011   EF 40-45%, moderate at K. of mid and distal inferior septum and anterior apical myocardium. Grade 1 diastolic function. -- Followup echocardiogram to reassess his EF was denied by insurance company  . TRANSTHORACIC ECHOCARDIOGRAM  07/20/2020    EF 40 to 45% (similar to 2012) stable wall motion normality (severe HK of midapical anterior apical wall-consistent with prior infarct).  GRII DD.  Normal valves.  => Stable compared to 08/30/2011.  EF was still 40 to 45%.    Current Outpatient Medications:  .  atorvastatin (LIPITOR) 20 MG tablet, , Disp: , Rfl:  .  acetaminophen (TYLENOL) 500 MG tablet, Take 500 mg by mouth every 6 (six) hours as needed for moderate pain or headache., Disp: , Rfl:  .  anastrozole (ARIMIDEX) 1 MG tablet, Take 1 tablet (1 mg total) by mouth daily., Disp: 90 tablet, Rfl: 3 .  Calcium Citrate-Vitamin D (CALCIUM + D PO), Take 1 tablet by mouth daily., Disp: , Rfl:  .  carvedilol (COREG) 12.5 MG tablet, Take 12.5 mg by mouth 2 (two) times daily., Disp: , Rfl:  .  clopidogrel (PLAVIX) 75 MG tablet, TAKE 1 TABLET BY MOUTH DAILY, Disp: 90 tablet, Rfl: 3 .  COVID-19 mRNA vaccine, Pfizer, 30 MCG/0.3ML injection, Inject into the muscle., Disp: 0.3 mL, Rfl: 0 .  diclofenac sodium (VOLTAREN) 1 % GEL, Apply 2 g topically 4 (four) times daily as needed (pain). , Disp: , Rfl:  .  DM-APAP-CPM (CORICIDIN HBP PO), Take 1 tablet by mouth daily as needed (allergies)., Disp: , Rfl:  .  ezetimibe (ZETIA) 10 MG tablet, Take 1 tablet (10 mg total) by mouth daily., Disp: 90 tablet, Rfl: 3 .  hydroxychloroquine (PLAQUENIL) 200 MG tablet, Take 1 tablet (200 mg total) by mouth daily., Disp: , Rfl:  .  leflunomide (ARAVA) 20 MG tablet, Take 20 mg by mouth daily., Disp: , Rfl:  .  Melatonin 5 MG CAPS, Take 5 mg by mouth at bedtime., Disp: , Rfl:  .  nitroGLYCERIN (NITROSTAT) 0.4 MG SL tablet, PLACE 1 TABLET UNDER THE TONGUE EVERY 5 MINUTES AS NEEDED FOR CHEST PAIN., Disp: 25 tablet, Rfl: 4 .  Olopatadine HCl (PATADAY OP), Place 1 drop into both eyes daily as needed (allergies)., Disp: , Rfl:  .  promethazine (PHENERGAN) 25 MG suppository, , Disp: , Rfl:  .  tiZANidine (ZANAFLEX) 2 MG tablet, Take 2 mg by mouth at bedtime., Disp: , Rfl:  .  traZODone (DESYREL) 100 MG tablet, Take 100 mg by mouth at bedtime. , Disp: , Rfl:   No Known Allergies Review of Systems Objective:  There were no vitals filed  for this visit.  General: Well developed, nourished, in no acute distress, alert and oriented x3   Dermatological: Skin is warm, dry and supple bilateral. Nails x 10 are well maintained; remaining integument appears unremarkable at this time. There are no open sores, no preulcerative lesions, no rash or signs of infection present.  Vascular: Dorsalis Pedis artery and Posterior Tibial artery pedal pulses are 2/4 bilateral with immedate capillary fill time. Pedal hair growth present. No varicosities and no lower extremity edema present bilateral.   Neruologic: Grossly intact via light touch bilateral. Vibratory intact via tuning fork bilateral. Protective threshold with Semmes Wienstein monofilament intact to all pedal sites bilateral. Patellar and Achilles deep tendon reflexes 2+ bilateral. No Babinski or clonus noted bilateral.  Musculoskeletal: No gross boney pedal deformities bilateral. No pain, crepitus, or limitation noted with foot and ankle range of motion bilateral. Muscular strength 5/5 in all groups tested bilateral.  She has mild tenderness on palpation of the second DIPJ of the left foot.  She also has a palpable neuroma to the third interdigital space of the left foot.  It appears that she may have had gout to the DIPJ is postinflammatory hyperpigmentation is noted there is also some thickening of this area.  No radiographs taken.  Gait: Unassisted, Nonantalgic.    Radiographs:  None taken  Assessment & Plan:   Assessment: Neuroma third interdigital space left foot.  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  Plan: I injected the neuroma today third interdigital space left foot with Kenalog and local anesthetic she tolerated procedure well without complications we will follow-up with her in 1 month.     Vaani Morren T. [place], [place]

## 2021-02-06 ENCOUNTER — Ambulatory Visit: Payer: Medicare Other | Attending: Family Medicine

## 2021-02-06 ENCOUNTER — Other Ambulatory Visit: Payer: Self-pay

## 2021-02-06 DIAGNOSIS — M25579 Pain in unspecified ankle and joints of unspecified foot: Secondary | ICD-10-CM | POA: Diagnosis not present

## 2021-02-06 DIAGNOSIS — N189 Chronic kidney disease, unspecified: Secondary | ICD-10-CM | POA: Diagnosis not present

## 2021-02-06 DIAGNOSIS — M79643 Pain in unspecified hand: Secondary | ICD-10-CM | POA: Diagnosis not present

## 2021-02-06 DIAGNOSIS — M1990 Unspecified osteoarthritis, unspecified site: Secondary | ICD-10-CM | POA: Diagnosis not present

## 2021-02-06 DIAGNOSIS — M549 Dorsalgia, unspecified: Secondary | ICD-10-CM | POA: Diagnosis not present

## 2021-02-06 DIAGNOSIS — Z79899 Other long term (current) drug therapy: Secondary | ICD-10-CM | POA: Diagnosis not present

## 2021-02-06 DIAGNOSIS — M79675 Pain in left toe(s): Secondary | ICD-10-CM | POA: Diagnosis not present

## 2021-02-06 DIAGNOSIS — D649 Anemia, unspecified: Secondary | ICD-10-CM | POA: Diagnosis not present

## 2021-02-06 DIAGNOSIS — Z483 Aftercare following surgery for neoplasm: Secondary | ICD-10-CM | POA: Insufficient documentation

## 2021-02-06 DIAGNOSIS — M7989 Other specified soft tissue disorders: Secondary | ICD-10-CM | POA: Diagnosis not present

## 2021-02-06 DIAGNOSIS — M0579 Rheumatoid arthritis with rheumatoid factor of multiple sites without organ or systems involvement: Secondary | ICD-10-CM | POA: Diagnosis not present

## 2021-02-06 DIAGNOSIS — M25561 Pain in right knee: Secondary | ICD-10-CM | POA: Diagnosis not present

## 2021-02-06 DIAGNOSIS — M810 Age-related osteoporosis without current pathological fracture: Secondary | ICD-10-CM | POA: Diagnosis not present

## 2021-02-06 NOTE — Therapy (Signed)
[place], [place], [address] Phone: [phone]   Fax:  [phone]  Physical Therapy Treatment  Patient Details  Name: Samantha Mcbride MRN: [id number] Date of Birth: Mar 09, 1951 Referring Provider (PT): Dr. Marlou Starks   Encounter Date: 02/06/2021   PT End of Session - 02/06/21 1028    Visit Number 1    Number of Visits 1    PT Start Time 1019    PT Stop Time 1026    PT Time Calculation (min) 7 min    Activity Tolerance Patient tolerated treatment well    Behavior During Therapy [facility] for tasks assessed/performed           Past Medical History:  Diagnosis Date  . Anemia    iron deficiency anemia   . CAD S/P percutaneous coronary angioplasty November 2012   Promus DES - mid LAD 3.5 mm x 24 mm (3.72 mm)   . Chronic kidney disease    stage 4 kidney disease per pt dx 02/2019  . Complication of anesthesia   . Dyslipidemia, goal LDL below 70     on statin, close to goal  . Family history of breast cancer   . Family history of skin cancer   . Former moderate cigarette smoker (10-19 per day)   . Glucose intolerance (impaired glucose tolerance)   . H/O thyroid nodule    Benign  . 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 --> Promus DES =>  EF 40 to 45% (similar to 2012) stable wall motion normality (severe HK of midapical anterior apical wall-consistent with prior infarct).  GRII DD.  Normal valves. => Stable compared to 08/30/2011.  EF was still 40 to 45%.  . Hypertension   . PONV (postoperative nausea and vomiting)    no issues with surgery on 05-11-2019  . Rheumatoid arthritis ([place])    managed on humira   . SOB (shortness of breath) on exertion    reports "its been that way since my heart attack " repots no recurrence of MI sx since that time     Past Surgical History:  Procedure Laterality Date  . ABDOMINAL  AORTAGRAM N/A 08/27/2011   Procedure: ABDOMINAL Maxcine Ham;  Surgeon: Troy Sine, MD;  Location: [facility] CATH LAB;  Service: Cardiovascular;  Laterality: N/A;  . ANKLE SURGERY Right 1995   per pt ankle surgery here at [facility] [place]   . APPENDECTOMY    . BREAST EXCISIONAL BIOPSY Left   . BREAST LUMPECTOMY WITH RADIOACTIVE SEED AND SENTINEL LYMPH NODE BIOPSY Right 08/05/2020   Procedure: RIGHT BREAST LUMPECTOMY WITH RADIOACTIVE SEED AND SENTINEL LYMPH NODE BIOPSY;  Surgeon: [name], MD;  Location: [place];  Service: General;  Laterality: Right;  . CYSTOSCOPY WITH RETROGRADE PYELOGRAM, URETEROSCOPY AND STENT PLACEMENT Bilateral 05/11/2019   Procedure: CYSTOSCOPY WITH RETROGRADE PYELOGRAM,  AND STENT PLACEMENT;  Surgeon: Lucas Mallow, MD;  Location[facility];  Service: Urology;  Laterality: Bilateral;  . CYSTOSCOPY/URETEROSCOPY/HOLMIUM LASER/STENT PLACEMENT Bilateral 05/25/2019   Procedure: CYSTOSCOPY BILATERAL URETEROSCOPY/HOLMIUM LASER/STENT PLACEMENT;  Surgeon: Lucas Mallow, MD;  Location[facility];  Service: Urology;  Laterality: Bilateral;  . LEFT HEART CATHETERIZATION WITH CORONARY ANGIOGRAM N/A 08/27/2011   Procedure: LEFT HEART CATHETERIZATION WITH CORONARY ANGIOGRAM;  Surgeon: Troy Sine, MD;  Location: [facility] CATH LAB;  Service: Cardiovascular;;For anterior STEMI/cardiac arrest -- 100% mid LAD occlusion  . PERCUTANEOUS CORONARY STENT  INTERVENTION (PCI-S) N/A 08/27/2011   Procedure: PERCUTANEOUS CORONARY STENT INTERVENTION (PCI-S);  Surgeon: Troy Sine, MD;  Location: [facility] CATH LAB;  Service: Cardiovascular;;  mid LAD PCI --> Promus Element DES 3.5 mm at 24 mm (3.72 mm)  . TRANSTHORACIC ECHOCARDIOGRAM  08/2011   EF 40-45%, moderate at K. of mid and distal inferior septum and anterior apical myocardium. Grade 1 diastolic function. -- Followup echocardiogram to reassess his EF was denied by insurance company  . TRANSTHORACIC ECHOCARDIOGRAM  07/20/2020    EF 40 to 45% (similar to 2012) stable  wall motion normality (severe HK of midapical anterior apical wall-consistent with prior infarct).  GRII DD.  Normal valves. => Stable compared to 08/30/2011.  EF was still 40 to 45%.    There were no vitals filed for this visit.   Subjective Assessment - 02/06/21 1025    Subjective Pt returns for her 3 month L-Dex screen.    Pertinent History Rt breast lumpectomy on 08/05/20 with 2 lymph nodes removed both negative.  Completed radiation.  On Anastrozole x 10/28/20 tolerating well                  L-DEX FLOWSHEETS - 02/06/21 1000      L-DEX LYMPHEDEMA SCREENING   Measurement Type Unilateral    L-DEX MEASUREMENT EXTREMITY Upper Extremity    POSITION  Standing    DOMINANT SIDE Right    At Risk Side Right    BASELINE SCORE (UNILATERAL) 7.4    L-DEX SCORE (UNILATERAL) 2.8    VALUE CHANGE (UNILAT) -4.6                                         Plan - 02/06/21 1031    Clinical Impression Statement Pt returns for her 3 month L-Dex screen. Her change from baseline of -4.6 is WNLs so no further treatment is required at this time except to cont every 3 month L-Dex screens which pt is agreeable to.    PT Next Visit Plan Cont every 3 month L-Dex screens for up to 2 years from her SLNB.    Consulted and Agree with Plan of Care Patient           Patient will benefit from skilled therapeutic intervention in order to improve the following deficits and impairments:     Visit Diagnosis: Aftercare following surgery for neoplasm     Problem List Patient Active Problem List   Diagnosis Date Noted  . Age-related osteoporosis without current pathological fracture 02/02/2021  . Coronary artery disease 02/02/2021  . Chronic kidney disease, stage 4 (severe) ([place]) 02/02/2021  . Mixed hyperlipidemia 02/02/2021  . Insomnia 02/02/2021  . Localized, primary osteoarthritis of hand 02/02/2021  . Osteopenia 02/02/2021  . Restless legs 02/02/2021  . Rheumatoid  arthritis ([place]) 02/02/2021  . Sciatica 02/02/2021  . Vitamin D deficiency 02/02/2021  . Genetic testing 07/15/2020  . Preoperative cardiovascular examination 07/11/2020  . Family history of breast cancer   . Family history of skin cancer   . Malignant neoplasm of upper-outer quadrant of right breast in female, estrogen receptor positive ([place]) 06/15/2020  . Pain in left knee 05/28/2019  . Ureteral calculus 05/11/2019  . Obesity (BMI 30-39.9) 07/18/2013  . Essential hypertension   . Hyperlipidemia associated with type 2 diabetes mellitus ([place]) 07/16/2013  . Glucose intolerance (impaired glucose tolerance) 07/16/2013  . Ventricular tachycardia, sustained;  Peri-infarct.  No further episodes 08/28/2011  . Hypokalemia 08/27/2011  . H/O Anterior STEMI:  Proximal LAD  insertion of a 3.5x24 mm Promus element DES (08/2011) 08/27/2011  . CAD S/P percutaneous coronary angioplasty 08/16/2011    Otelia Limes, PTA 02/06/2021, 10:33 AM  [place], [place], [address] Phone: [phone]   Fax:  [phone]  Name: Kory Panjwani MRN: [id number] Date of Birth: 04/15/1951

## 2021-02-21 DIAGNOSIS — H52223 Regular astigmatism, bilateral: Secondary | ICD-10-CM | POA: Diagnosis not present

## 2021-02-21 DIAGNOSIS — H2513 Age-related nuclear cataract, bilateral: Secondary | ICD-10-CM | POA: Diagnosis not present

## 2021-02-21 DIAGNOSIS — Z79899 Other long term (current) drug therapy: Secondary | ICD-10-CM | POA: Diagnosis not present

## 2021-02-21 DIAGNOSIS — H524 Presbyopia: Secondary | ICD-10-CM | POA: Diagnosis not present

## 2021-02-21 DIAGNOSIS — H5203 Hypermetropia, bilateral: Secondary | ICD-10-CM | POA: Diagnosis not present

## 2021-03-01 DIAGNOSIS — C50211 Malignant neoplasm of upper-inner quadrant of right female breast: Secondary | ICD-10-CM | POA: Diagnosis not present

## 2021-03-01 DIAGNOSIS — Z170 Estrogen receptor positive status [ER+]: Secondary | ICD-10-CM | POA: Diagnosis not present

## 2021-03-07 ENCOUNTER — Other Ambulatory Visit: Payer: Self-pay

## 2021-03-07 ENCOUNTER — Ambulatory Visit: Payer: Medicare Other | Admitting: Podiatry

## 2021-03-07 DIAGNOSIS — G5782 Other specified mononeuropathies of left lower limb: Secondary | ICD-10-CM

## 2021-03-07 DIAGNOSIS — G5762 Lesion of plantar nerve, left lower limb: Secondary | ICD-10-CM | POA: Diagnosis not present

## 2021-03-07 MED ORDER — GABAPENTIN 100 MG PO CAPS: 100.0000 mg | ORAL_CAPSULE | Freq: Every day | ORAL | 1 refills | Status: DC

## 2021-03-07 MED ORDER — TRIAMCINOLONE ACETONIDE 40 MG/ML IJ SUSP
20.0000 mg | Freq: Once | INTRAMUSCULAR | Status: AC
  Administered 2021-03-07: 20 mg

## 2021-03-07 NOTE — Progress Notes (Signed)
She presents today 1 month follow-up states that she is not having much improvement she said the injection from the previous visit helped relieve some discomfort for a few days but the flareup still occurring.  Objective: Vital signs are stable alert and x3 pulses are palpable.  There is no erythema edema cellulitis drainage or odor tenderness on palpation third interdigital space.  Assessment: Neuroma.  Cannot rule out neuropathy  Plan: Discussed etiology pathology and surgical therapies at this point I reinjected the site today we will follow-up with her in a month started her on gabapentin 100 mg nightly

## 2021-04-11 ENCOUNTER — Ambulatory Visit: Payer: Medicare Other | Admitting: Podiatry

## 2021-04-11 ENCOUNTER — Other Ambulatory Visit: Payer: Self-pay

## 2021-04-11 DIAGNOSIS — G5762 Lesion of plantar nerve, left lower limb: Secondary | ICD-10-CM

## 2021-04-11 DIAGNOSIS — G5782 Other specified mononeuropathies of left lower limb: Secondary | ICD-10-CM

## 2021-04-11 MED ORDER — GABAPENTIN 100 MG PO CAPS: 300.0000 mg | ORAL_CAPSULE | Freq: Every day | ORAL | 3 refills | Status: DC

## 2021-04-11 MED ORDER — DEXAMETHASONE SODIUM PHOSPHATE 120 MG/30ML IJ SOLN
2.0000 mg | Freq: Once | INTRAMUSCULAR | Status: AC
  Administered 2021-04-11: 2 mg via INTRA_ARTICULAR

## 2021-04-11 NOTE — Progress Notes (Signed)
She presents today for follow-up of her neuroma she states that its been feeling better since taking the gabapentin.  Objective: Vital signs are stable alert oriented x3 minimal palpable pain third left.  Pulses are palpable.  Assessment: Resolving restless leg syndrome with gabapentin.  Neuroma third interspace.  Plan: Increase her gabapentin to 200 mg at nighttime.  Also reinjected the neuroma today with dexamethasone and local anesthetic.  I will follow-up with her in 1 month if necessary.

## 2021-05-01 DIAGNOSIS — E1169 Type 2 diabetes mellitus with other specified complication: Secondary | ICD-10-CM | POA: Diagnosis not present

## 2021-05-01 DIAGNOSIS — I2510 Atherosclerotic heart disease of native coronary artery without angina pectoris: Secondary | ICD-10-CM | POA: Diagnosis not present

## 2021-05-01 DIAGNOSIS — N184 Chronic kidney disease, stage 4 (severe): Secondary | ICD-10-CM | POA: Diagnosis not present

## 2021-05-01 DIAGNOSIS — E785 Hyperlipidemia, unspecified: Secondary | ICD-10-CM | POA: Diagnosis not present

## 2021-05-01 DIAGNOSIS — Z9861 Coronary angioplasty status: Secondary | ICD-10-CM | POA: Diagnosis not present

## 2021-05-02 LAB — HEPATIC FUNCTION PANEL
ALT: 6 IU/L (ref 0–32)
AST: 17 IU/L (ref 0–40)
Albumin: 4.2 g/dL (ref 3.8–4.8)
Alkaline Phosphatase: 91 IU/L (ref 44–121)
Bilirubin Total: 0.2 mg/dL (ref 0.0–1.2)
Bilirubin, Direct: <0.1 mg/dL (ref 0.00–0.40)
Total Protein: 6.7 g/dL (ref 6.0–8.5)

## 2021-05-02 LAB — LIPID PANEL
Chol/HDL Ratio: 3.5 ratio (ref 0.0–4.4)
Cholesterol, Total: 131 mg/dL (ref 100–199)
HDL: 37 mg/dL — ABNORMAL LOW (ref >39)
LDL Chol Calc (NIH): 57 mg/dL (ref 0–99)
Triglycerides: 227 mg/dL — ABNORMAL HIGH (ref 0–149)
VLDL Cholesterol Cal: 37 mg/dL (ref 5–40)

## 2021-05-03 ENCOUNTER — Other Ambulatory Visit: Payer: Self-pay | Admitting: Family Medicine

## 2021-05-03 ENCOUNTER — Other Ambulatory Visit: Payer: Self-pay | Admitting: Adult Health

## 2021-05-03 DIAGNOSIS — Z853 Personal history of malignant neoplasm of breast: Secondary | ICD-10-CM

## 2021-05-08 ENCOUNTER — Ambulatory Visit
Admission: RE | Admit: 2021-05-08 | Discharge: 2021-05-08 | Disposition: A | Discharge status: Home / Self Care | Payer: Medicare Other | Source: Ambulatory Visit | Attending: Family Medicine | Admitting: Family Medicine

## 2021-05-08 ENCOUNTER — Other Ambulatory Visit: Payer: Self-pay

## 2021-05-08 ENCOUNTER — Ambulatory Visit
Admission: RE | Admit: 2021-05-08 | Discharge: 2021-05-08 | Disposition: A | Discharge status: Home / Self Care | Payer: Medicare Other | Source: Ambulatory Visit | Attending: Adult Health | Admitting: Adult Health

## 2021-05-08 ENCOUNTER — Other Ambulatory Visit: Payer: Self-pay | Admitting: Adult Health

## 2021-05-08 DIAGNOSIS — N644 Mastodynia: Secondary | ICD-10-CM | POA: Diagnosis not present

## 2021-05-08 DIAGNOSIS — Z853 Personal history of malignant neoplasm of breast: Secondary | ICD-10-CM

## 2021-05-08 DIAGNOSIS — R922 Inconclusive mammogram: Secondary | ICD-10-CM | POA: Diagnosis not present

## 2021-05-10 DIAGNOSIS — M0579 Rheumatoid arthritis with rheumatoid factor of multiple sites without organ or systems involvement: Secondary | ICD-10-CM | POA: Diagnosis not present

## 2021-05-10 DIAGNOSIS — M810 Age-related osteoporosis without current pathological fracture: Secondary | ICD-10-CM | POA: Diagnosis not present

## 2021-05-10 DIAGNOSIS — D649 Anemia, unspecified: Secondary | ICD-10-CM | POA: Diagnosis not present

## 2021-05-10 DIAGNOSIS — Z79899 Other long term (current) drug therapy: Secondary | ICD-10-CM | POA: Diagnosis not present

## 2021-05-10 DIAGNOSIS — N189 Chronic kidney disease, unspecified: Secondary | ICD-10-CM | POA: Diagnosis not present

## 2021-05-10 DIAGNOSIS — M549 Dorsalgia, unspecified: Secondary | ICD-10-CM | POA: Diagnosis not present

## 2021-05-10 DIAGNOSIS — M79643 Pain in unspecified hand: Secondary | ICD-10-CM | POA: Diagnosis not present

## 2021-05-10 DIAGNOSIS — M7989 Other specified soft tissue disorders: Secondary | ICD-10-CM | POA: Diagnosis not present

## 2021-05-10 DIAGNOSIS — M1990 Unspecified osteoarthritis, unspecified site: Secondary | ICD-10-CM | POA: Diagnosis not present

## 2021-05-10 DIAGNOSIS — M79675 Pain in left toe(s): Secondary | ICD-10-CM | POA: Diagnosis not present

## 2021-05-10 DIAGNOSIS — M25562 Pain in left knee: Secondary | ICD-10-CM | POA: Diagnosis not present

## 2021-05-11 ENCOUNTER — Other Ambulatory Visit: Payer: Self-pay

## 2021-05-11 ENCOUNTER — Ambulatory Visit: Payer: Medicare Other | Admitting: Cardiology

## 2021-05-11 VITALS — BP 118/72 | HR 74 | Ht 61.0 in | Wt 187.6 lb

## 2021-05-11 DIAGNOSIS — E1169 Type 2 diabetes mellitus with other specified complication: Secondary | ICD-10-CM | POA: Diagnosis not present

## 2021-05-11 DIAGNOSIS — I700 Atherosclerosis of aorta: Secondary | ICD-10-CM

## 2021-05-11 DIAGNOSIS — E669 Obesity, unspecified: Secondary | ICD-10-CM

## 2021-05-11 DIAGNOSIS — N184 Chronic kidney disease, stage 4 (severe): Secondary | ICD-10-CM | POA: Diagnosis not present

## 2021-05-11 DIAGNOSIS — J431 Panlobular emphysema: Secondary | ICD-10-CM | POA: Diagnosis not present

## 2021-05-11 DIAGNOSIS — E785 Hyperlipidemia, unspecified: Secondary | ICD-10-CM

## 2021-05-11 DIAGNOSIS — I472 Ventricular tachycardia, unspecified: Secondary | ICD-10-CM

## 2021-05-11 DIAGNOSIS — I10 Essential (primary) hypertension: Secondary | ICD-10-CM | POA: Diagnosis not present

## 2021-05-11 DIAGNOSIS — J439 Emphysema, unspecified: Secondary | ICD-10-CM | POA: Insufficient documentation

## 2021-05-11 DIAGNOSIS — I2109 ST elevation (STEMI) myocardial infarction involving other coronary artery of anterior wall: Secondary | ICD-10-CM | POA: Diagnosis not present

## 2021-05-11 DIAGNOSIS — Z9861 Coronary angioplasty status: Secondary | ICD-10-CM

## 2021-05-11 DIAGNOSIS — I2510 Atherosclerotic heart disease of native coronary artery without angina pectoris: Secondary | ICD-10-CM | POA: Diagnosis not present

## 2021-05-11 NOTE — Patient Instructions (Addendum)
Medication Instructions:  No changes  *If you need a refill on your cardiac medications before your next appointment, please call your pharmacy*   Lab Work:  Fasting Lipid and CMP in 2023 before your next in office visit    If you have labs (blood work) drawn today and your tests are completely normal, you will receive your results only by: [place] (if you have MyChart) OR A paper copy in the mail If you have any lab test that is abnormal or we need to change your treatment, we will call you to review the results.   Testing/Procedures:  Not needed  Follow-Up: At [facility], you and your health needs are our priority.  As part of our continuing mission to provide you with exceptional heart care, we have created designated Provider Care Teams.  These Care Teams include your primary Cardiologist (physician) and Advanced Practice Providers (APPs -  Physician Assistants and Nurse Practitioners) who all work together to provide you with the care you need, when you need it.     Your next appointment:   12 month(s)  The format for your next appointment:   In Person  Provider:   Glenetta Hew, MD

## 2021-05-11 NOTE — Progress Notes (Signed)
Primary Care Provider: Antony Contras, MD Cardiologist: Glenetta Hew, MD Electrophysiologist: None Nephrologis: Dr. Carolynne Edouard  Clinic Note: Chief Complaint  Patient presents with   Follow-up    6 months.  Doing well.  Has completed cancer treatment   Coronary Artery Disease    No angina or heart failure symptoms.   ===================================  ASSESSMENT/PLAN   Problem List Items Addressed This Visit       Cardiology Problems   H/O Anterior STEMI:  Proximal LAD  insertion of a 3.5x24 mm Promus element DES (08/2011) (Chronic)    Large anterior MI back in November 2012 that essentially presented as a VT arrest with collapse.  She had CPR on the scene, and in the emergency room. EF now 40 to 45% but no CHF symptoms.  Stable findings in October 2021.  On stable regimen.       Relevant Medications   rosuvastatin (CRESTOR) 20 MG tablet   Other Relevant Orders   EKG 12-Lead (Completed)   Comprehensive metabolic panel   CAD S/P percutaneous coronary angioplasty - Primary (Chronic)    DES PCI to the LAD in the setting of MI.  No further episodes of any angina.  Plan: Continue carvedilol along with rosuvastatin. Continue clopidogrel monotherapy for maintenance-prefer regular aspirin. Okay to hold the clopidogrel 5 days preop for surgery or procedures.  7 days for more high risk procedures.      Relevant Medications   rosuvastatin (CRESTOR) 20 MG tablet   Other Relevant Orders   EKG 12-Lead (Completed)   Lipid panel   Comprehensive metabolic panel   Ventricular tachycardia, sustained; Peri-infarct.  No further episodes (Chronic)    Ventricular tachycardia in setting of STEMI.  No further episodes.  Mildly reduced EF.  Plan: Continue current dose of carvedilol.       Relevant Medications   rosuvastatin (CRESTOR) 20 MG tablet   Essential hypertension (Chronic)    Well-controlled blood pressure.  She is no longer on ACE inhibitor (or diuretic) from a  nephrology standpoint because of CKD-4.  On stable dose of carvedilol.       Relevant Medications   rosuvastatin (CRESTOR) 20 MG tablet   Thoracic aortic atherosclerosis (HCC)-seen on CT   Relevant Medications   rosuvastatin (CRESTOR) 20 MG tablet     Other   Hyperlipidemia associated with type 2 diabetes mellitus (HCC) (Chronic)    Most recent lipid panel has been well controlled LDL on current dose of rosuvastatin, but triglycerides were high.  She tells me that she actually was not fasting when these were checked.  She had had a Coke and a English muffin with some M&Ms prior to the labs being drawn.  This could probably explain the triglycerides being elevated.  Monitor and reassess -> recheck lipids prior to follow-up office visit next year.       Relevant Medications   rosuvastatin (CRESTOR) 20 MG tablet   Other Relevant Orders   Lipid panel   Comprehensive metabolic panel   Obesity (BMI 30-39.9) (Chronic)    Hopefully, she will start getting more active.  She is too sedentary.  I stressed that it would be helpful for her to even potentially consider water aerobics/exercise. Discussed dietary modifications.       Relevant Orders   Lipid panel   Comprehensive metabolic panel   Chronic kidney disease, stage 4 (severe) (HCC) (Chronic)    Being followed by Dr. Carolynne Edouard from [place] -> no longer on ACE inhibitor.  Still making urine, no imminent concerns of requiring HD.  Avoid nephrotoxins.  Would need to have a very high index of suspicion before considering contrasted studies-either CT scan or cardiac cath.       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  She is not on any medications at this point, but has yet to discuss with her PCP. Wonder if she would potentially benefit from baseline therapy.  Defer to PCP.       ===================================  HPI:     Samantha Mcbride is a 70 y.o. female with a PMH notable for CAD/Anterior STEMI (complicated by VT arrest-LAD PCI), Mild-Moderate ICM (stable EF 40 to 45%-mid apical-anterior hypokinesis c/w prior LAD infarct)) CKD 4, HTN, HLD, RA history of Breast CA who presents today for 8-month follow-up.  Hebah Bogosian was last seen on 11/10/2020 -> stable cardiac standpoint.  Was undergoing breast cancer therapy having undergone surgery and radiation.  Starting to get back into routine.  Just not back to be fully active yet.  More sedentary than usual.  Walking limited by knee and hip pain bilaterally.  Somewhat deconditioned with some exertional dyspnea.  No chest pain or pressure.  Reluctant to try new medicines  Recent Hospitalizations: None  Reviewed  CV studies:    The following studies were reviewed today: (if available, images/films reviewed: From Epic Chart or Care Everywhere) None:  Interval History:   Samantha Mcbride returns today for follow-up.  She is happy that she has completed her breast cancer treatment and is starting to feel more like her normal self.  She really does not have much leg any complaints but she has not been that active over the summertime.  She has been quite sedentary of late.  Her knee hurts too much to do much walking and she really cannot do that he.  She stays inside in the air conditioning.  When she goes to the grocery store, she has to use a cart to lean on, when she does that, she really has not noticed much in the way of any chest tightness or pressure.  She does get some exertional dyspnea which is a much baseline for her.  She had a recent CT scan which showed COPD/emphysema which upset her some.  She said that Dr.Bandhari recently stopped her ACE inhibitor because of hypertension.  For the most part, her walking is limited by her knee pain more so than a dyspnea.  She just had a left knee injection and is now pending injection in her hip.  It has helped some  but not fully.  For the most part, other than her baseline dyspnea and a little bit of fatigue, she has been doing well from a cardiac standpoint.  CV Review of Symptoms (Summary) Cardiovascular ROS: positive for - dyspnea on exertion and that not much different from her baseline.  Also some mild end of day edema.  Mostly sedentary-not necessarily lazy.  Does not have tolerance of heat. negative for - chest pain, irregular heartbeat, orthopnea, palpitations, paroxysmal nocturnal dyspnea, rapid heart rate, shortness of breath, or lightheadedness, dizziness or wooziness, syncope/near syncope, TIA/amaurosis fugax, claudication.  REVIEWED OF SYSTEMS   Review of Systems  Constitutional:  Positive for weight loss (Almost nonintentional, but she has noted a little bit of weight loss because of decreased p.o. intake). Negative for malaise/fatigue (Energy level is getting better now that she is  no longer having her breast cancer treatment.  But unfortunately she became sedentary during that time.,  And her arthritis pains are worse.  As such, she is not very active.).  HENT:  Negative for congestion and nosebleeds.   Respiratory:  Positive for shortness of breath (Baseline). Negative for cough.        Exertional dyspnea with overdoing it.  Cardiovascular:        Per HPI  Musculoskeletal:  Positive for joint pain (Left knee and both hips).  Neurological:  Positive for weakness (Sometimes her left leg will get weak because the knee). Negative for dizziness (Orthostatic), focal weakness and headaches.  Psychiatric/Behavioral:  Negative for depression (Hard to tell if she is having signs and symptoms of anhedonia and sleep change.) and memory loss. The patient is nervous/anxious. The patient does not have insomnia.    I have reviewed and (if needed) personally updated the patient's problem list, medications, allergies, past medical and surgical history, social and family history.   PAST MEDICAL HISTORY    Past Medical History:  Diagnosis Date   Anemia    iron deficiency anemia    CAD S/P percutaneous coronary angioplasty November 2012   Promus DES - mid LAD 3.5 mm x 24 mm (3.72 mm)    Chronic kidney disease    stage 4 kidney disease per pt dx 03/2951   Complication of anesthesia    Dyslipidemia, goal LDL below 70     on statin, close to goal   Family history of breast cancer    Family history of skin cancer    Former moderate cigarette smoker (10-19 per day)    Glucose intolerance (impaired glucose tolerance)    H/O thyroid nodule    Benign   History 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 --> Promus DES =>  EF 40 to 45% (similar to 2012) stable wall motion normality (severe HK of midapical anterior apical wall-consistent with prior infarct).  GRII DD.  Normal valves. => Stable compared to 08/30/2011.  EF was still 40 to 45%.   Hypertension    PONV (postoperative nausea and vomiting)    no issues with surgery on 05-11-2019   Rheumatoid arthritis ([place])    managed on humira    SOB (shortness of breath) on exertion    reports "its been that way since my heart attack " repots no recurrence of MI sx since that time     PAST SURGICAL HISTORY   Past Surgical History:  Procedure Laterality Date   ABDOMINAL AORTAGRAM N/A 08/27/2011   Procedure: ABDOMINAL Maxcine [place];  Surgeon: Troy Sine, MD;  Location: [facility] CATH LAB;  Service: Cardiovascular;  Laterality: N/A;   ANKLE SURGERY Right 1995   per pt ankle surgery here at [facility] EXCISIONAL BIOPSY Left    BREAST LUMPECTOMY WITH RADIOACTIVE SEED AND SENTINEL LYMPH NODE BIOPSY Right 08/05/2020   Procedure: RIGHT BREAST LUMPECTOMY WITH RADIOACTIVE SEED AND SENTINEL LYMPH NODE BIOPSY;  Surgeon: [name], MD;  Location: [facility];  Service: General;  Laterality: Right;   CYSTOSCOPY WITH RETROGRADE PYELOGRAM, URETEROSCOPY AND STENT PLACEMENT  Bilateral 05/11/2019   Procedure: CYSTOSCOPY WITH RETROGRADE PYELOGRAM,  AND STENT PLACEMENT;  Surgeon: Lucas Mallow, MD;  Location: [facility];  Service: Urology;  Laterality: Bilateral;   CYSTOSCOPY/URETEROSCOPY/HOLMIUM LASER/STENT PLACEMENT Bilateral 05/25/2019   Procedure: CYSTOSCOPY BILATERAL URETEROSCOPY/HOLMIUM LASER/STENT PLACEMENT;  Surgeon: Lucas Mallow, MD;  Location: [facility];  Service: Urology;  Laterality: Bilateral;   LEFT HEART CATHETERIZATION WITH CORONARY ANGIOGRAM N/A 08/27/2011   Procedure: LEFT HEART CATHETERIZATION WITH CORONARY ANGIOGRAM;  Surgeon: Troy Sine, MD;  Location: [facility] CATH LAB;  Service: Cardiovascular;;For anterior STEMI/cardiac arrest -- 100% mid LAD occlusion   PERCUTANEOUS CORONARY STENT INTERVENTION (PCI-S) N/A 08/27/2011   Procedure: PERCUTANEOUS CORONARY STENT INTERVENTION (PCI-S);  Surgeon: Troy Sine, MD;  Location: [facility] CATH LAB;  Service: Cardiovascular;;  mid LAD PCI --> Promus Element DES 3.5 mm at 24 mm (3.72 mm)   TRANSTHORACIC ECHOCARDIOGRAM  08/2011   EF 40-45%, moderate at K. of mid and distal inferior septum and anterior apical myocardium. Grade 1 diastolic function. -- Followup echocardiogram to reassess his EF was denied by insurance company   TRANSTHORACIC ECHOCARDIOGRAM  07/20/2020    EF 40 to 45% (similar to 2012) stable wall motion normality (severe HK of midapical anterior apical wall-consistent with prior infarct).  GRII DD.  Normal valves. => Stable compared to 08/30/2011.  EF was still 40 to 45%.    Immunization History  Administered Date(s) Administered   Influenza Split 07/03/2013, 07/12/2014, 06/15/2017, 06/29/2020   Influenza, High Dose Seasonal PF 06/15/2017, 05/30/2019   Influenza,inj,Quad PF,6+ Mos 06/25/2015   Influenza,trivalent, recombinat, inj, PF 06/12/2018   PFIZER Comirnaty(Gray Top)Covid-19 Tri-Sucrose Vaccine 01/24/2021   PFIZER(Purple Top)SARS-COV-2 Vaccination 11/20/2019, 12/15/2019, 06/14/2020    Pneumococcal Conjugate-13 05/13/2017   Pneumococcal Polysaccharide-23 08/28/2011, 05/12/2019   Tdap 09/24/2014   Zoster, Live 03/29/2017, 09/23/2020, 12/29/2020    MEDICATIONS/ALLERGIES   Current Meds  Medication Sig   acetaminophen (TYLENOL) 500 MG tablet Take 500 mg by mouth every 6 (six) hours as needed for moderate pain or headache.   anastrozole (ARIMIDEX) 1 MG tablet Take 1 tablet (1 mg total) by mouth daily.   Calcium Citrate-Vitamin D (CALCIUM + D PO) Take 1 tablet by mouth daily.   carvedilol (COREG) 12.5 MG tablet Take 12.5 mg by mouth 2 (two) times daily.   clopidogrel (PLAVIX) 75 MG tablet TAKE 1 TABLET BY MOUTH DAILY   COVID-19 mRNA vaccine, Pfizer, 30 MCG/0.3ML injection Inject into the muscle.   diclofenac sodium (VOLTAREN) 1 % GEL Apply 2 g topically 4 (four) times daily as needed (pain).    DM-APAP-CPM (CORICIDIN HBP PO) Take 1 tablet by mouth daily as needed (allergies).   gabapentin (NEURONTIN) 100 MG capsule Take 3 capsules (300 mg total) by mouth at bedtime.   hydroxychloroquine (PLAQUENIL) 200 MG tablet Take 1 tablet (200 mg total) by mouth daily.   leflunomide (ARAVA) 20 MG tablet Take 20 mg by mouth daily.   Melatonin 5 MG CAPS Take 5 mg by mouth at bedtime.   nitroGLYCERIN (NITROSTAT) 0.4 MG SL tablet PLACE 1 TABLET UNDER THE TONGUE EVERY 5 MINUTES AS NEEDED FOR CHEST PAIN.   Olopatadine HCl (PATADAY OP) Place 1 drop into both eyes daily as needed (allergies).   promethazine (PHENERGAN) 25 MG suppository    rosuvastatin (CRESTOR) 20 MG tablet Take 20 mg by mouth daily.   tiZANidine (ZANAFLEX) 2 MG tablet Take 2 mg by mouth at bedtime.   traZODone (DESYREL) 100 MG tablet Take 100 mg by mouth at bedtime.     No Known Allergies  SOCIAL HISTORY/FAMILY HISTORY   Reviewed in Epic:  Pertinent findings:  Social History   Tobacco Use   Smoking status: Former    Packs/day: 1.00    Years: 40.00  Pack years: 40.00    Types: Cigarettes    Quit date:  08/27/2011    Years since quitting: 9.7   Smokeless tobacco: Never  Substance Use Topics   Alcohol use: No   Drug use: No   Social History   Social History Narrative   Single mother of one, grandmother of 2. She works for [employer] for [employer].   She quit smoking time of her MI in November 2012. Does not take alcohol.   She had been doing really well he exercises at least 3-4 days a week, but really get discouraged that she is not been able to drop weight.    OBJCTIVE -PE, EKG, labs   Wt Readings from Last 3 Encounters:  05/11/21 187 lb 9.6 oz (85.1 kg)  01/05/21 192 lb 6.4 oz (87.3 kg)  11/10/20 193 lb 12.8 oz (87.9 kg)    Physical Exam: BP 118/72   Pulse 74   Ht 5\' 1"  (1.549 m)   Wt 187 lb 9.6 oz (85.1 kg)   SpO2 92%   BMI 35.45 kg/m  Physical Exam Vitals reviewed.  Constitutional:      General: She is not in acute distress.    Appearance: Normal appearance. She is obese. She is not ill-appearing or toxic-appearing.     Comments: Mildly obese.  Well-groomed  HENT:     Head: Normocephalic and atraumatic.  Neck:     Vascular: No carotid bruit, hepatojugular reflux or JVD.  Cardiovascular:     Rate and Rhythm: Normal rate and regular rhythm. No extrasystoles are present.    Chest Wall: PMI is not displaced (Difficult to palpate).     Pulses: Normal pulses.     Heart sounds: S1 normal and S2 normal. Heart sounds are distant.    No friction rub. No gallop.  Pulmonary:     Effort: Pulmonary effort is normal. No respiratory distress.     Breath sounds: Normal breath sounds. No wheezing, rhonchi or rales.  Musculoskeletal:        General: No swelling. Normal range of motion.     Cervical back: Normal range of motion and neck supple.  Skin:    General: Skin is warm and dry.     Coloration: Skin is not pale.  Neurological:     General: No focal deficit present.     Mental Status: She is alert and oriented to person, place, and time.   Psychiatric:        Mood and Affect: Mood normal.        Behavior: Behavior normal.        Thought Content: Thought content normal.        Judgment: Judgment normal.     Adult ECG Report  Rate: 74 ;  Rhythm: normal sinus rhythm and low voltage.  Cannot rule out anteroseptal MI, age-indeterminate.  Otherwise normal axis, intervals and durations. ;   Narrative Interpretation: Stable.  Recent Labs: Reviewed - was not fasting (had had a coke & English Muffin + M&Ms Lab Results  Component Value Date   CHOL 131 05/01/2021   HDL 37 (L) 05/01/2021   LDLCALC 57 05/01/2021   TRIG 227 (H) 05/01/2021   CHOLHDL 3.5 05/01/2021   Lab Results  Component Value Date   CREATININE 2.65 (H) 09/09/2020   BUN 29 (H) 09/09/2020   NA 141 09/09/2020   K 3.7 09/09/2020   CL 103 09/09/2020   CO2 23 09/09/2020   CBC Latest Ref Rng &  Units 07/28/2020 05/22/2019 05/14/2019  WBC 4.0 - 10.5 K/uL 8.8 9.9 12.4(H)  Hemoglobin 12.0 - 15.0 g/dL 11.3(L) 9.6(L) 8.4(L)  Hematocrit 36.0 - 46.0 % 36.5 31.0(L) 27.6(L)  Platelets 150 - 400 K/uL 222 251 174    No results found for: TSH  ==================================================  COVID-19 Education: The signs and symptoms of COVID-19 were discussed with the patient and how to seek care for testing (follow up with PCP or arrange E-visit).    I spent a total of 32 minutes with the patient spent in direct patient consultation.  -> We talked a bit about her lipid management and more so about her cancer therapy.  We discussed her limited activity due to her arthritis pains. Additional time spent with chart review  / charting (studies, outside notes, etc): 16  min Total Time: 48 min  Current medicines are reviewed at length with the patient today.  (+/- concerns) none  This visit occurred during the SARS-CoV-2 public health emergency.  Safety protocols were in place, including screening questions prior to the visit, additional usage of staff PPE, and extensive  cleaning of exam room while observing appropriate contact time as indicated for disinfecting solutions.  Notice: This dictation was prepared with Dragon dictation along with smaller phrase technology. Any transcriptional errors that result from this process are unintentional and may not be corrected upon review.  Patient Instructions / Medication Changes & Studies & Tests Ordered   Patient Instructions  Medication Instructions:  No changes  *If you need a refill on your cardiac medications before your next appointment, please call your pharmacy*   Lab Work:  Fasting Lipid and CMP in 2023 before your next in office visit    If you have labs (blood work) drawn today and your tests are completely normal, you will receive your results only by: MyChart Message (if you have MyChart) OR A paper copy in the mail If you have any lab test that is abnormal or we need to change your treatment, we will call you to review the results.   Testing/Procedures:  Not needed  Follow-Up: At [facility], you and your health needs are our priority.  As part of our continuing mission to provide you with exceptional heart care, we have created designated Provider Care Teams.  These Care Teams include your primary Cardiologist (physician) and Advanced Practice Providers (APPs -  Physician Assistants and Nurse Practitioners) who all work together to provide you with the care you need, when you need it.     Your next appointment:   12 month(s)  The format for your next appointment:   In Person  Provider:   Glenetta Hew, MD     Studies Ordered:   Orders Placed This Encounter  Procedures   Lipid panel   Comprehensive metabolic panel   EKG 95-KDTO      Glenetta Hew, M.D., M.S. Interventional Cardiologist   Pager # [phone] Phone # [phone] [address] [place], [place] [address]   Thank you for choosing [facility] at [facility]!!

## 2021-05-12 DIAGNOSIS — D631 Anemia in chronic kidney disease: Secondary | ICD-10-CM | POA: Diagnosis not present

## 2021-05-12 DIAGNOSIS — I129 Hypertensive chronic kidney disease with stage 1 through stage 4 chronic kidney disease, or unspecified chronic kidney disease: Secondary | ICD-10-CM | POA: Diagnosis not present

## 2021-05-12 DIAGNOSIS — N184 Chronic kidney disease, stage 4 (severe): Secondary | ICD-10-CM | POA: Diagnosis not present

## 2021-05-12 DIAGNOSIS — M069 Rheumatoid arthritis, unspecified: Secondary | ICD-10-CM | POA: Diagnosis not present

## 2021-05-12 DIAGNOSIS — C50919 Malignant neoplasm of unspecified site of unspecified female breast: Secondary | ICD-10-CM | POA: Diagnosis not present

## 2021-05-16 ENCOUNTER — Ambulatory Visit: Payer: Medicare Other | Admitting: Podiatry

## 2021-05-20 ENCOUNTER — Encounter: Payer: Self-pay | Admitting: Cardiology

## 2021-05-20 NOTE — Assessment & Plan Note (Addendum)
Being followed by Dr. Carolynne Edouard from [place] -> no longer on ACE inhibitor.  Still making urine, no imminent concerns of requiring HD.  Avoid nephrotoxins.  Would need to have a very high index of suspicion before considering contrasted studies-either CT scan or cardiac cath.

## 2021-05-20 NOTE — Assessment & Plan Note (Signed)
Hopefully, she will start getting more active.  She is too sedentary.  I stressed that it would be helpful for her to even potentially consider water aerobics/exercise. Discussed dietary modifications.

## 2021-05-20 NOTE — Assessment & Plan Note (Signed)
Newly diagnosed finding on CT scan.  She is not on any medications at this point, but has yet to discuss with her PCP. Wonder if she would potentially benefit from baseline therapy.  Defer to PCP.

## 2021-05-20 NOTE — Assessment & Plan Note (Signed)
Most recent lipid panel has been well controlled LDL on current dose of rosuvastatin, but triglycerides were high.  She tells me that she actually was not fasting when these were checked.  She had had a Coke and a English muffin with some M&Ms prior to the labs being drawn.  This could probably explain the triglycerides being elevated.  Monitor and reassess -> recheck lipids prior to follow-up office visit next year.

## 2021-05-20 NOTE — Assessment & Plan Note (Signed)
Well-controlled blood pressure.  She is no longer on ACE inhibitor (or diuretic) from a nephrology standpoint because of CKD-4.  On stable dose of carvedilol.

## 2021-05-20 NOTE — Assessment & Plan Note (Signed)
Ventricular tachycardia in setting of STEMI.  No further episodes.  Mildly reduced EF.  Plan: Continue current dose of carvedilol.

## 2021-05-20 NOTE — Assessment & Plan Note (Signed)
DES PCI to the LAD in the setting of MI.  No further episodes of any angina.  Plan:  Continue carvedilol along with rosuvastatin.  Continue clopidogrel monotherapy for maintenance-prefer regular aspirin.  Okay to hold the clopidogrel 5 days preop for surgery or procedures.  7 days for more high risk procedures.

## 2021-05-20 NOTE — Assessment & Plan Note (Signed)
Large anterior MI back in November 2012 that essentially presented as a VT arrest with collapse.  She had CPR on the scene, and in the emergency room. EF now 40 to 45% but no CHF symptoms.  Stable findings in October 2021.  On stable regimen.

## 2021-05-22 ENCOUNTER — Other Ambulatory Visit: Payer: Self-pay

## 2021-05-22 ENCOUNTER — Ambulatory Visit: Payer: Medicare Other | Attending: General Surgery

## 2021-05-22 VITALS — Wt 183.2 lb

## 2021-05-22 DIAGNOSIS — Z483 Aftercare following surgery for neoplasm: Secondary | ICD-10-CM | POA: Insufficient documentation

## 2021-05-22 NOTE — Therapy (Signed)
[place], [place], [address] Phone: [phone]   Fax:  [phone]  Physical Therapy Treatment  Patient Details  Name: Samantha Mcbride MRN: [id number] Date of Birth: 1951/07/19 Referring Provider (PT): Dr. Marlou Starks   Encounter Date: 05/22/2021   PT End of Session - 05/22/21 1016     Visit Number 1   # unchanged due to screen only   PT Start Time 1011    PT Stop Time 1017    PT Time Calculation (min) 6 min    Activity Tolerance Patient tolerated treatment well    Behavior During Therapy [facility] for tasks assessed/performed             Past Medical History:  Diagnosis Date   Anemia    iron deficiency anemia    CAD S/P percutaneous coronary angioplasty November 2012   Promus DES - mid LAD 3.5 mm x 24 mm (3.72 mm)    Chronic kidney disease    stage 4 kidney disease per pt dx 04/2535   Complication of anesthesia    Dyslipidemia, goal LDL below 70     on statin, close to goal   Family history of breast cancer    Family history of skin cancer    Former moderate cigarette smoker (10-19 per day)    Glucose intolerance (impaired glucose tolerance)    H/O thyroid nodule    Benign   History 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 --> Promus DES =>  EF 40 to 45% (similar to 2012) stable wall motion normality (severe HK of midapical anterior apical wall-consistent with prior infarct).  GRII DD.  Normal valves. => Stable compared to 08/30/2011.  EF was still 40 to 45%.   Hypertension    PONV (postoperative nausea and vomiting)    no issues with surgery on 05-11-2019   Rheumatoid arthritis ([place])    managed on humira    SOB (shortness of breath) on exertion    reports "its been that way since my heart attack " repots no recurrence of MI sx since that time     Past Surgical History:  Procedure Laterality Date   ABDOMINAL AORTAGRAM  N/A 08/27/2011   Procedure: ABDOMINAL Maxcine Ham;  Surgeon: Troy Sine, MD;  Location: [facility] CATH LAB;  Service: Cardiovascular;  Laterality: N/A;   ANKLE SURGERY Right 1995   per pt ankle surgery here at [place] EXCISIONAL BIOPSY Left    BREAST LUMPECTOMY WITH RADIOACTIVE SEED AND SENTINEL LYMPH NODE BIOPSY Right 08/05/2020   Procedure: RIGHT BREAST LUMPECTOMY WITH RADIOACTIVE SEED AND SENTINEL LYMPH NODE BIOPSY;  Surgeon: [name], MD;  Location: [place];  Service: General;  Laterality: Right;   CYSTOSCOPY WITH RETROGRADE PYELOGRAM, URETEROSCOPY AND STENT PLACEMENT Bilateral 05/11/2019   Procedure: CYSTOSCOPY WITH RETROGRADE PYELOGRAM,  AND STENT PLACEMENT;  Surgeon: Lucas Mallow, MD;  Location: [facility] ORS;  Service: Urology;  Laterality: Bilateral;   CYSTOSCOPY/URETEROSCOPY/HOLMIUM LASER/STENT PLACEMENT Bilateral 05/25/2019   Procedure: CYSTOSCOPY BILATERAL URETEROSCOPY/HOLMIUM LASER/STENT PLACEMENT;  Surgeon: Lucas Mallow, MD;  Location: [facility] ORS;  Service: Urology;  Laterality: Bilateral;   LEFT HEART CATHETERIZATION WITH CORONARY ANGIOGRAM N/A 08/27/2011   Procedure: LEFT HEART CATHETERIZATION WITH CORONARY ANGIOGRAM;  Surgeon: Troy Sine, MD;  Location: [facility] CATH LAB;  Service: Cardiovascular;;For anterior STEMI/cardiac arrest -- 100% mid LAD occlusion  PERCUTANEOUS CORONARY STENT INTERVENTION (PCI-S) N/A 08/27/2011   Procedure: PERCUTANEOUS CORONARY STENT INTERVENTION (PCI-S);  Surgeon: Troy Sine, MD;  Location: [facility] CATH LAB;  Service: Cardiovascular;;  mid LAD PCI --> Promus Element DES 3.5 mm at 24 mm (3.72 mm)   TRANSTHORACIC ECHOCARDIOGRAM  08/2011   EF 40-45%, moderate at K. of mid and distal inferior septum and anterior apical myocardium. Grade 1 diastolic function. -- Followup echocardiogram to reassess his EF was denied by insurance company   TRANSTHORACIC ECHOCARDIOGRAM  07/20/2020    EF 40 to 45% (similar to 2012) stable wall motion  normality (severe HK of midapical anterior apical wall-consistent with prior infarct).  GRII DD.  Normal valves. => Stable compared to 08/30/2011.  EF was still 40 to 45%.    Vitals:   05/22/21 1014  Weight: 183 lb 4 oz (83.1 kg)     Subjective Assessment - 05/22/21 1015     Subjective Pt returns for her 3 month L-Dex screen.    Pertinent History Rt breast lumpectomy on 08/05/20 with 2 lymph nodes removed both negative.  Completed radiation.  On Anastrozole x 10/28/20 tolerating well                    L-DEX FLOWSHEETS - 05/22/21 1000       L-DEX LYMPHEDEMA SCREENING   Measurement Type Unilateral    L-DEX MEASUREMENT EXTREMITY Upper Extremity    POSITION  Standing    DOMINANT SIDE Right    At Risk Side Right    BASELINE SCORE (UNILATERAL) 7.4    L-DEX SCORE (UNILATERAL) 0    VALUE CHANGE (UNILAT) -7.4                                           Plan - 05/22/21 1017     Clinical Impression Statement Pt returns for her 3 month L-Dex screen. Her change from baseline of -7.4 is WNLs so no further treatment is required at this time except to cont every 3 month L-Dex screens which pt is agreeable to.    PT Next Visit Plan Cont every 3 month L-Dex screens for up to 2 years from her SLNB.    Consulted and Agree with Plan of Care Patient             Patient will benefit from skilled therapeutic intervention in order to improve the following deficits and impairments:     Visit Diagnosis: Aftercare following surgery for neoplasm     Problem List Patient Active Problem List   Diagnosis Date Noted   Thoracic aortic atherosclerosis (HCC)-seen on CT 05/11/2021   COPD (chronic obstructive pulmonary disease) with emphysema ([place]) noted on CT 05/11/2021   Age-related osteoporosis without current pathological fracture 02/02/2021   Chronic kidney disease, stage 4 (severe) ([place]) 02/02/2021   Insomnia 02/02/2021   Localized, primary  osteoarthritis of hand 02/02/2021   Osteopenia 02/02/2021   Restless legs 02/02/2021   Rheumatoid arthritis ([place]) 02/02/2021   Sciatica 02/02/2021   Vitamin D deficiency 02/02/2021   Genetic testing 07/15/2020   Preoperative cardiovascular examination 07/11/2020   Family history of breast cancer    Family history of skin cancer    Malignant neoplasm of upper-outer quadrant of right breast in female, estrogen receptor positive ([place]) 06/15/2020   Pain in left knee 05/28/2019   Ureteral calculus 05/11/2019   Obesity (BMI  30-39.9) 07/18/2013   Essential hypertension    Hyperlipidemia associated with type 2 diabetes mellitus ([place]) 07/16/2013   Glucose intolerance (impaired glucose tolerance) 07/16/2013   Ventricular tachycardia, sustained; Peri-infarct.  No further episodes 08/28/2011   Hypokalemia 08/27/2011   H/O Anterior STEMI:  Proximal LAD  insertion of a 3.5x24 mm Promus element DES (08/2011) 08/27/2011   CAD S/P percutaneous coronary angioplasty 08/16/2011    Otelia Limes, PTA 05/22/2021, 10:18 AM  [place], [place], [address] Phone: [phone]   Fax:  [phone]  Name: Maelani Yarbro MRN: [id number] Date of Birth: [birth year]

## 2021-07-06 ENCOUNTER — Other Ambulatory Visit: Payer: Self-pay

## 2021-07-06 ENCOUNTER — Ambulatory Visit: Payer: Medicare Other | Admitting: Podiatry

## 2021-07-06 ENCOUNTER — Encounter: Payer: Self-pay | Admitting: Podiatry

## 2021-07-06 DIAGNOSIS — G5782 Other specified mononeuropathies of left lower limb: Secondary | ICD-10-CM

## 2021-07-06 DIAGNOSIS — G5762 Lesion of plantar nerve, left lower limb: Secondary | ICD-10-CM | POA: Diagnosis not present

## 2021-07-06 NOTE — Progress Notes (Signed)
She presents today for follow-up of her left foot.  States that the posterior aspect of the foot is feeling much better the midfoot is feeling much better but still has pain to the third interdigital space of the left foot.  Objective: Vital signs are stable alert oriented x3.  Pulses are palpable.  She still has a palpable Mulder's click third interspace left foot.  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  Plan: At this point introduced on dehydrated alcohol injections today and I will follow-up with her in 3 to 4 weeks for her second dose.

## 2021-07-07 IMAGING — MG MM BREAST SURGICAL SPECIMEN
1 series · 1 of 1 positions shown · non-contrast
Comparison: Previous exam(s).

CLINICAL DATA: Evaluate surgical specimen following lumpectomy for
RIGHT breast cancer.

EXAM:
SPECIMEN RADIOGRAPH OF THE RIGHT BREAST

[R]
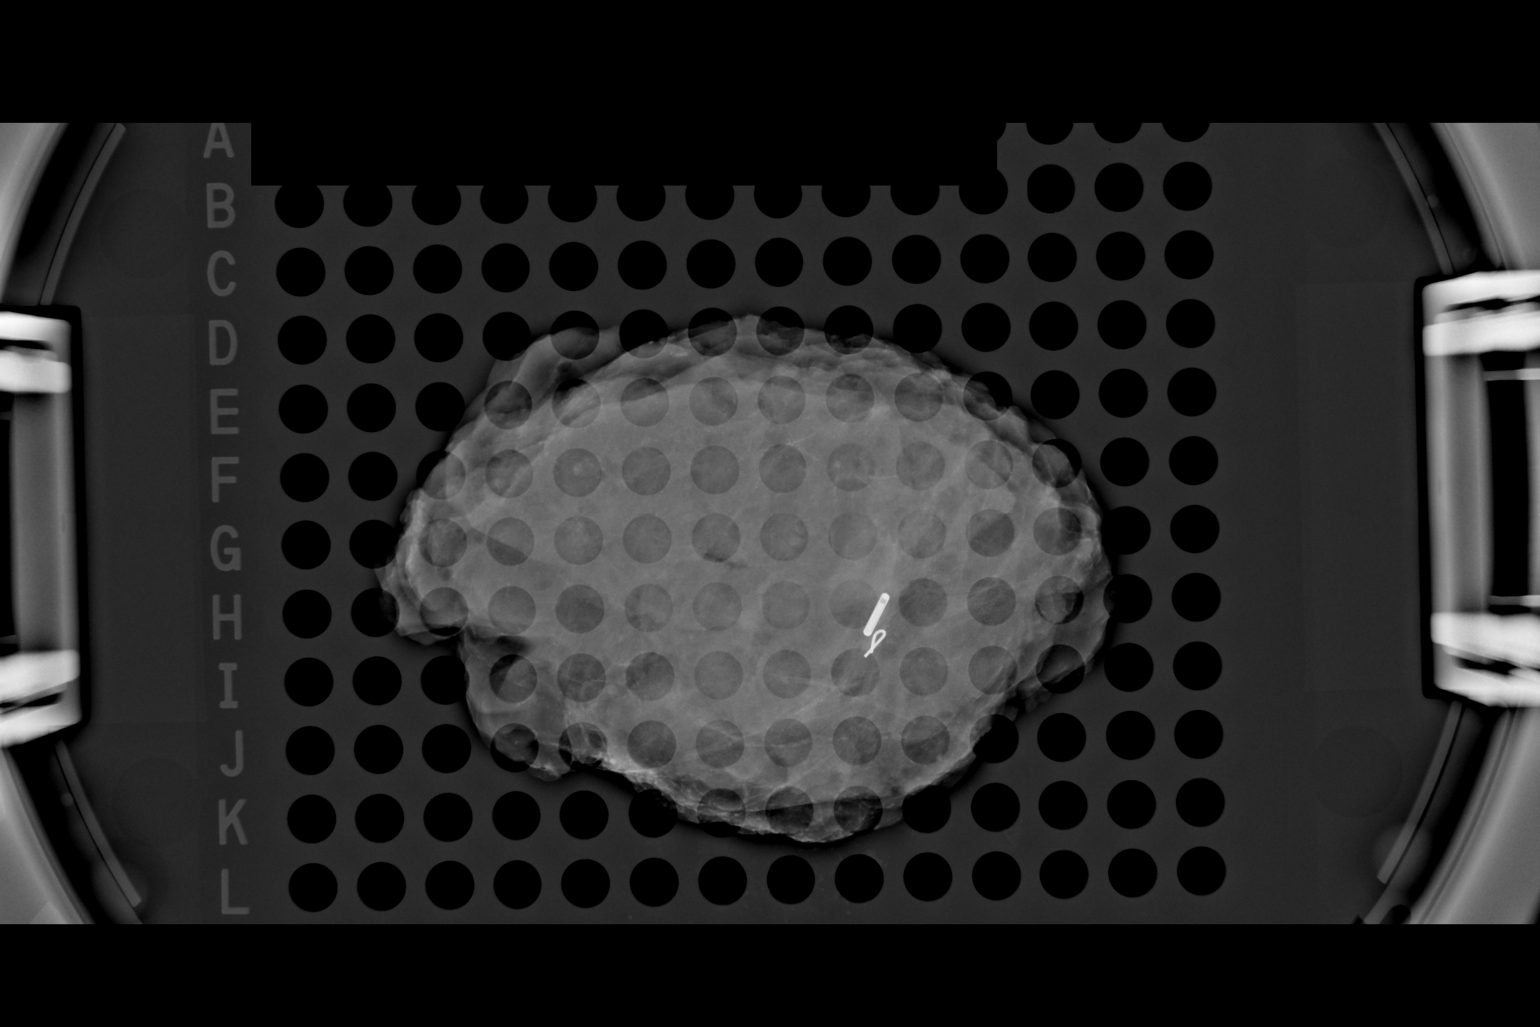

[1 of 1 positions shown; findings below may reference images not displayed]

FINDINGS: Status post excision of the RIGHT breast. The radioactive seed and
biopsy marker clip are present, completely intact, and were marked
for pathology.
IMPRESSION: Specimen radiograph of the RIGHT breast.

## 2021-07-08 NOTE — Progress Notes (Signed)
Patient Care Team: Antony Contras, MD as PCP - General (Family Medicine) Leonie Man, MD as PCP - Cardiology (Cardiology) Kyung Rudd, MD as Consulting Physician (Radiation Oncology) [name], MD as Consulting Physician (General Surgery) Nicholas Lose, MD as Consulting Physician (Hematology and Oncology)  DIAGNOSIS:    ICD-10-CM   1. Malignant neoplasm of upper-outer quadrant of right breast in female, estrogen receptor positive ([place])  C50.411 DG Bone Density   Z17.0     2. Age-related osteoporosis without current pathological fracture  M81.0       SUMMARY OF ONCOLOGIC HISTORY: Oncology History  Malignant neoplasm of upper-outer quadrant of right breast in female, estrogen receptor positive ([place])  06/02/2020 Initial Diagnosis   05/20/20 showed a 0.6cm asymmetry in the lateral right breast and a 0.5cm mass at the 12:30 position. Biopsy on 06/02/20 showed invasive ductal carcinoma, grade 1, HER-2 equivocal by IHC, negative by FISH, ER+ 95%, PR+ 95%, Ki67 10%   07/15/2020 Genetic Testing   Negative genetic testing on the common hereditary Mcbride panel.  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  The Common Hereditary Gene Panel offered by [facility] includes sequencing and/or deletion duplication testing of the following 48 genes: APC, ATM, AXIN2, BARD1, BMPR1A, BRCA1, BRCA2, BRIP1, CDH1, CDK4, CDKN2A (p14ARF), CDKN2A (p16INK4a), CHEK2, CTNNA1, DICER1, EPCAM (Deletion/duplication testing only), GREM1 (promoter region deletion/duplication testing only), KIT, MEN1, MLH1, MSH2, MSH3, MSH6, MUTYH, NBN, NF1, NHTL1, PALB2, PDGFRA, PMS2, POLD1, POLE, PTEN, RAD50, RAD51C, RAD51D, RNF43, SDHB, SDHC, SDHD, SMAD4, SMARCA4. STK11, TP53, TSC1, TSC2, and VHL.  The following genes were evaluated for sequence changes only: SDHA and HOXB13 c.251G>A variant only. The report date is 07/15/2020.   08/05/2020 Surgery   Right lumpectomy Marlou Starks) [phone]): IDC, grade 1, 0.4cm, clear margins, 2 right axillary  lymph nodes negative for carcinoma   09/12/2020 - 10/12/2020 Radiation Therapy   The patient initially received a dose of 42.56 Gy in 16 fractions to the breast using whole-breast tangent fields. This was delivered using a 3-D conformal technique. The pt received a boost delivering an additional 8 Gy in 4 fractions using a electron boost with 25mV electrons. The total dose was 50.56 Gy.   09/2020 - 09/2025 Anti-estrogen oral therapy   Anastrozole     CHIEF COMPLIANT: Follow-up to discuss antiestrogen therapy  INTERVAL HISTORY: Samantha Mcbride a 70y.o. with above-mentioned history of right breast Mcbride who underwent a right lumpectomy and radiation, currently on antiestrogen therapy with anastrozole. Mammogram on 05/08/2021 showed no evidence of malignancy. She presents to the clinic today for follow-up.   ALLERGIES:  has No Known Allergies.  MEDICATIONS:  Current Outpatient Medications  Medication Sig Dispense Refill   acetaminophen (TYLENOL) 500 MG tablet Take 500 mg by mouth every 6 (six) hours as needed for moderate pain or headache.     anastrozole (ARIMIDEX) 1 MG tablet Take 1 tablet (1 mg total) by mouth daily. 90 tablet 3   Calcium Citrate-Vitamin D (CALCIUM + D PO) Take 1 tablet by mouth daily.     carvedilol (COREG) 12.5 MG tablet Take 12.5 mg by mouth 2 (two) times daily.     clopidogrel (PLAVIX) 75 MG tablet TAKE 1 TABLET BY MOUTH DAILY 90 tablet 3   COVID-19 mRNA vaccine, Pfizer, 30 MCG/0.3ML injection Inject into the muscle. 0.3 mL 0   diclofenac sodium (VOLTAREN) 1 % GEL Apply 2 g topically 4 (four) times daily as needed (pain).      DM-APAP-CPM (CORICIDIN HBP PO) Take  1 tablet by mouth daily as needed (allergies).     ezetimibe (ZETIA) 10 MG tablet Take 1 tablet (10 mg total) by mouth daily. 90 tablet 3   gabapentin (NEURONTIN) 100 MG capsule Take 3 capsules (300 mg total) by mouth at bedtime. 270 capsule 3   hydroxychloroquine (PLAQUENIL) 200 MG tablet Take 1 tablet  (200 mg total) by mouth daily.     leflunomide (ARAVA) 20 MG tablet Take 20 mg by mouth daily.     Melatonin 5 MG CAPS Take 5 mg by mouth at bedtime.     nitroGLYCERIN (NITROSTAT) 0.4 MG SL tablet PLACE 1 TABLET UNDER THE TONGUE EVERY 5 MINUTES AS NEEDED FOR CHEST PAIN. 25 tablet 4   Olopatadine HCl (PATADAY OP) Place 1 drop into both eyes daily as needed (allergies).     penicillin v potassium (VEETID) 500 MG tablet Take 500 mg by mouth 4 (four) times daily.     promethazine (PHENERGAN) 25 MG suppository      rosuvastatin (CRESTOR) 20 MG tablet Take 20 mg by mouth daily.     tiZANidine (ZANAFLEX) 2 MG tablet Take 2 mg by mouth at bedtime.     traZODone (DESYREL) 100 MG tablet Take 100 mg by mouth at bedtime.      No current facility-administered medications for this visit.    PHYSICAL EXAMINATION: ECOG PERFORMANCE STATUS: 1 - Symptomatic but completely ambulatory  Vitals:   07/10/21 1143  BP: 108/74  Pulse: 80  Resp: 18  Temp: (!) 97.2 F (36.2 C)  SpO2: 100%   Filed Weights   07/10/21 1143  Weight: 187 lb 14.4 oz (85.2 kg)    BREAST: No palpable masses or nodules in either right or left breasts. No palpable axillary supraclavicular or infraclavicular adenopathy no breast tenderness or nipple discharge. (exam performed in the presence of a chaperone)  LABORATORY DATA:  I have reviewed the data as listed CMP Latest Ref Rng & Units 05/01/2021 09/09/2020 07/28/2020  Glucose 65 - 99 mg/dL - 117(H) 97  BUN 8 - 27 mg/dL - 29(H) 27(H)  Creatinine 0.57 - 1.00 mg/dL - 2.65(H) 2.36(H)  Sodium 134 - 144 mmol/L - 141 138  Potassium 3.5 - 5.2 mmol/L - 3.7 3.6  Chloride 96 - 106 mmol/L - 103 106  CO2 20 - 29 mmol/L - 23 22  Calcium 8.7 - 10.3 mg/dL - 9.1 9.0  Total Protein 6.0 - 8.5 g/dL 6.7 6.8 -  Total Bilirubin 0.0 - 1.2 mg/dL 0.2 0.3 -  Alkaline Phos 44 - 121 IU/L 91 104 -  AST 0 - 40 IU/L 17 12 -  ALT 0 - 32 IU/L 6 6 -    Lab Results  Component Value Date   WBC 8.8  07/28/2020   HGB 11.3 (L) 07/28/2020   HCT 36.5 07/28/2020   MCV 91.9 07/28/2020   PLT 222 07/28/2020   NEUTROABS 14.4 (H) 05/13/2019    ASSESSMENT & PLAN:  Malignant neoplasm of upper-outer quadrant of right breast in female, estrogen receptor positive ([place]) 05/20/20 showed a 0.6cm asymmetry in the lateral right breast and a 0.5cm mass at the 12:30 position. Biopsy on 06/02/20 showed invasive ductal carcinoma, grade 1, HER-2 equivocal by IHC, negative by FISH, ER+ 95%, PR+ 95%, Ki67 10%   08/05/2020:Right lumpectomy Marlou Starks): IDC, grade 1, 0.4cm, clear margins, 2 right axillary lymph nodes negative for carcinoma   Treatment plan: 1.  Adjuvant radiation therapy started 09/13/2020 2. followed by adjuvant antiestrogen therapy with anastrozole  daily x5 years   Anastrozole toxicities:none  Osteoporosis: starting Prolia, calcium and Vit D  Breast Mcbride surveillance: 1.  Breast exam: 07/10/2021: Benign 2. Mammogram 05/08/2021 with ultrasound: No mammographic evidence of malignancy.  Breast density category B  Return to clinic in 1 year for follow-up    Orders Placed This Encounter  Procedures   DG Bone Density    Standing Status:   Future    Standing Expiration Date:   07/10/2022    Order Specific Question:   Reason for Exam (SYMPTOM  OR DIAGNOSIS REQUIRED)    Answer:   Osteoporosis    Order Specific Question:   Preferred imaging location?    Answer:   [facility]    The patient has a good understanding of the overall plan. she agrees with it. she will call with any problems that may develop before the next visit here.  Total time spent: 30 mins including face to face time and time spent for planning, charting and coordination of care  Rulon Eisenmenger, MD, MPH 07/10/2021  I, Thana Ates, am acting as scribe for Dr. Nicholas Lose.  I have reviewed the above documentation for accuracy and completeness, and I agree with the above.

## 2021-07-10 ENCOUNTER — Other Ambulatory Visit: Payer: Self-pay

## 2021-07-10 ENCOUNTER — Inpatient Hospital Stay: Payer: Medicare Other | Attending: Hematology and Oncology | Admitting: Hematology and Oncology

## 2021-07-10 DIAGNOSIS — Z79899 Other long term (current) drug therapy: Secondary | ICD-10-CM | POA: Diagnosis not present

## 2021-07-10 DIAGNOSIS — C50411 Malignant neoplasm of upper-outer quadrant of right female breast: Secondary | ICD-10-CM

## 2021-07-10 DIAGNOSIS — Z79811 Long term (current) use of aromatase inhibitors: Secondary | ICD-10-CM | POA: Insufficient documentation

## 2021-07-10 DIAGNOSIS — M810 Age-related osteoporosis without current pathological fracture: Secondary | ICD-10-CM | POA: Diagnosis not present

## 2021-07-10 DIAGNOSIS — Z923 Personal history of irradiation: Secondary | ICD-10-CM | POA: Diagnosis not present

## 2021-07-10 DIAGNOSIS — Z170 Estrogen receptor positive status [ER+]: Secondary | ICD-10-CM

## 2021-07-10 MED ORDER — ANASTROZOLE 1 MG PO TABS: 1.0000 mg | ORAL_TABLET | Freq: Every day | ORAL | 3 refills | Status: DC

## 2021-07-10 NOTE — Assessment & Plan Note (Signed)
05/20/20 showed a 0.6cm asymmetry in the lateral right breast and a 0.5cm mass at the 12:30 position. Biopsy on 06/02/20 showed invasive ductal carcinoma, grade 1, HER-2 equivocal by IHC, negative by FISH, ER+ 95%, PR+ 95%, Ki67 10%  08/05/2020:Right lumpectomy Samantha Mcbride): IDC, grade 1, 0.4cm, clear margins, 2 right axillary lymph nodes negative for carcinoma  Treatment plan: 1.Adjuvant radiation therapy started 09/13/2020 2.followed by adjuvant antiestrogen therapy with anastrozole daily x5 years  Anastrozole toxicities:  Breast cancer surveillance: 1.  Breast exam: 07/10/2021: Benign 2. Mammogram 05/08/2021 with ultrasound: No mammographic evidence of malignancy.  Breast density category B  Return to clinic in 1 year for follow-up

## 2021-07-13 ENCOUNTER — Telehealth: Payer: Self-pay | Admitting: Hematology and Oncology

## 2021-07-13 NOTE — Telephone Encounter (Signed)
Patient's insurance is not approving Prolia. We will substituted with Zometa annually. And I called the patient to discuss this and she informed me that she is getting her tooth pulled this week. Therefore we will postpone her injection appointment for 3 months.

## 2021-07-17 ENCOUNTER — Inpatient Hospital Stay: Payer: Medicare Other

## 2021-07-20 ENCOUNTER — Ambulatory Visit: Payer: Medicare Other | Attending: Internal Medicine

## 2021-07-20 ENCOUNTER — Other Ambulatory Visit (HOSPITAL_BASED_OUTPATIENT_CLINIC_OR_DEPARTMENT_OTHER): Payer: Self-pay

## 2021-07-20 DIAGNOSIS — Z23 Encounter for immunization: Secondary | ICD-10-CM

## 2021-07-20 MED ORDER — PFIZER COVID-19 VAC BIVALENT 30 MCG/0.3ML IM SUSP
INTRAMUSCULAR | 0 refills | Status: DC
  Filled 2021-07-20: qty 0.3, 1d supply, fill #0

## 2021-07-20 NOTE — Progress Notes (Signed)
   Covid-19 Vaccination Clinic  Name:  Samantha Mcbride    MRN: [id number] DOB: October 15, 1951  07/20/2021  Samantha Mcbride was observed post Covid-19 immunization for 15 minutes without incident. She was provided with Vaccine Information Sheet and instruction to access the V-Safe system.   Samantha Mcbride was instructed to call 911 with any severe reactions post vaccine: Difficulty breathing  Swelling of face and throat  A fast heartbeat  A bad rash all over body  Dizziness and weakness

## 2021-07-25 ENCOUNTER — Other Ambulatory Visit: Payer: Self-pay | Admitting: Adult Health

## 2021-07-25 DIAGNOSIS — C50411 Malignant neoplasm of upper-outer quadrant of right female breast: Secondary | ICD-10-CM

## 2021-08-08 ENCOUNTER — Other Ambulatory Visit: Payer: Self-pay

## 2021-08-08 ENCOUNTER — Ambulatory Visit: Payer: Medicare Other | Admitting: Podiatry

## 2021-08-08 ENCOUNTER — Encounter: Payer: Self-pay | Admitting: Podiatry

## 2021-08-08 DIAGNOSIS — G5782 Other specified mononeuropathies of left lower limb: Secondary | ICD-10-CM | POA: Diagnosis not present

## 2021-08-08 NOTE — Progress Notes (Signed)
She presents today chief complaint of neuroma third interdigital space left foot.  States that the shot helped some as we had introduced her first dehydrated alcohol injection last time.  She states that the gabapentin has helped with the restless legs at night she is happy with that.  Objective: Vital signs are stable she is alert oriented x3.  Pulses are palpable.  Neurologic sensorium is intact palpable Mulder's click third interspace left foot exquisitely tender.  Assessment: Neuroma third interspace left foot slowly resolving.  Plan: Injected her second dose of dehydrated alcohol left foot.  Follow-up with her in 1 month

## 2021-08-11 DIAGNOSIS — M545 Low back pain, unspecified: Secondary | ICD-10-CM | POA: Diagnosis not present

## 2021-08-14 DIAGNOSIS — M25552 Pain in left hip: Secondary | ICD-10-CM | POA: Diagnosis not present

## 2021-08-14 DIAGNOSIS — M549 Dorsalgia, unspecified: Secondary | ICD-10-CM | POA: Diagnosis not present

## 2021-08-14 DIAGNOSIS — D649 Anemia, unspecified: Secondary | ICD-10-CM | POA: Diagnosis not present

## 2021-08-14 DIAGNOSIS — M79643 Pain in unspecified hand: Secondary | ICD-10-CM | POA: Diagnosis not present

## 2021-08-14 DIAGNOSIS — N189 Chronic kidney disease, unspecified: Secondary | ICD-10-CM | POA: Diagnosis not present

## 2021-08-14 DIAGNOSIS — M1990 Unspecified osteoarthritis, unspecified site: Secondary | ICD-10-CM | POA: Diagnosis not present

## 2021-08-14 DIAGNOSIS — M7989 Other specified soft tissue disorders: Secondary | ICD-10-CM | POA: Diagnosis not present

## 2021-08-14 DIAGNOSIS — Z79899 Other long term (current) drug therapy: Secondary | ICD-10-CM | POA: Diagnosis not present

## 2021-08-14 DIAGNOSIS — M0579 Rheumatoid arthritis with rheumatoid factor of multiple sites without organ or systems involvement: Secondary | ICD-10-CM | POA: Diagnosis not present

## 2021-08-14 DIAGNOSIS — M7062 Trochanteric bursitis, left hip: Secondary | ICD-10-CM | POA: Diagnosis not present

## 2021-08-14 DIAGNOSIS — M810 Age-related osteoporosis without current pathological fracture: Secondary | ICD-10-CM | POA: Diagnosis not present

## 2021-08-17 DIAGNOSIS — M549 Dorsalgia, unspecified: Secondary | ICD-10-CM | POA: Diagnosis not present

## 2021-08-17 DIAGNOSIS — N189 Chronic kidney disease, unspecified: Secondary | ICD-10-CM | POA: Diagnosis not present

## 2021-08-17 DIAGNOSIS — M7062 Trochanteric bursitis, left hip: Secondary | ICD-10-CM | POA: Diagnosis not present

## 2021-08-17 DIAGNOSIS — M810 Age-related osteoporosis without current pathological fracture: Secondary | ICD-10-CM | POA: Diagnosis not present

## 2021-08-17 DIAGNOSIS — M1990 Unspecified osteoarthritis, unspecified site: Secondary | ICD-10-CM | POA: Diagnosis not present

## 2021-08-17 DIAGNOSIS — D649 Anemia, unspecified: Secondary | ICD-10-CM | POA: Diagnosis not present

## 2021-08-17 DIAGNOSIS — M79643 Pain in unspecified hand: Secondary | ICD-10-CM | POA: Diagnosis not present

## 2021-08-17 DIAGNOSIS — M25552 Pain in left hip: Secondary | ICD-10-CM | POA: Diagnosis not present

## 2021-08-24 DIAGNOSIS — L820 Inflamed seborrheic keratosis: Secondary | ICD-10-CM | POA: Diagnosis not present

## 2021-08-24 DIAGNOSIS — L918 Other hypertrophic disorders of the skin: Secondary | ICD-10-CM | POA: Diagnosis not present

## 2021-08-24 DIAGNOSIS — L720 Epidermal cyst: Secondary | ICD-10-CM | POA: Diagnosis not present

## 2021-08-24 DIAGNOSIS — M25562 Pain in left knee: Secondary | ICD-10-CM | POA: Diagnosis not present

## 2021-08-24 DIAGNOSIS — L821 Other seborrheic keratosis: Secondary | ICD-10-CM | POA: Diagnosis not present

## 2021-08-28 ENCOUNTER — Ambulatory Visit: Payer: Medicare Other

## 2021-09-05 ENCOUNTER — Encounter: Payer: Self-pay | Admitting: Podiatry

## 2021-09-05 ENCOUNTER — Ambulatory Visit: Payer: Medicare Other | Admitting: Podiatry

## 2021-09-05 ENCOUNTER — Other Ambulatory Visit: Payer: Self-pay

## 2021-09-05 DIAGNOSIS — G5782 Other specified mononeuropathies of left lower limb: Secondary | ICD-10-CM

## 2021-09-05 NOTE — Progress Notes (Signed)
She presents today for follow-up of her neuroma last time she was in we had injected her second dose of dehydrated alcohol and she states that seems to be an improvement after each injection.  She is requesting another injection.  Objective: Vital signs are stable alert and oriented x3 there is no erythema edema/drainage odor palpable click third interspace left foot very tender.  Assessment: Pain in limb secondary to neuroma third interspace left foot slowly resolving.  She states that is greater than 50% resolved.  Plan: Reinjected today with her third dose of dehydrated alcohol follow-up with her in 1 month

## 2021-10-03 ENCOUNTER — Encounter: Payer: Self-pay | Admitting: Podiatry

## 2021-10-03 ENCOUNTER — Ambulatory Visit: Payer: Medicare Other | Admitting: Podiatry

## 2021-10-03 ENCOUNTER — Other Ambulatory Visit: Payer: Self-pay

## 2021-10-03 DIAGNOSIS — M5432 Sciatica, left side: Secondary | ICD-10-CM

## 2021-10-03 DIAGNOSIS — G5782 Other specified mononeuropathies of left lower limb: Secondary | ICD-10-CM | POA: Diagnosis not present

## 2021-10-03 DIAGNOSIS — R29898 Other symptoms and signs involving the musculoskeletal system: Secondary | ICD-10-CM

## 2021-10-03 DIAGNOSIS — C50919 Malignant neoplasm of unspecified site of unspecified female breast: Secondary | ICD-10-CM | POA: Insufficient documentation

## 2021-10-03 DIAGNOSIS — M1990 Unspecified osteoarthritis, unspecified site: Secondary | ICD-10-CM | POA: Insufficient documentation

## 2021-10-03 NOTE — Progress Notes (Signed)
She presents today for follow-up of her neuroma third interdigital space of her left foot.  States that is approximately 75 to 80% improved.  She states that her third dose seems to have made considerable improvement.   Objective: Vital signs are stable alert and oriented x3.  Pulses are palpable.  There is no erythema edema cellulitis drainage or odor she does have a palpable Mulder's click third interdigital space left foot.  Assessment: Slowly resolving neuroma third interdigital space left foot.  Plan: Today we injected her fourth dose of dehydrated alcohol after sterile Betadine skin prep to the third interspace left foot.  Follow-up with her in 4 weeks.

## 2021-10-10 ENCOUNTER — Ambulatory Visit: Payer: Medicare Other | Attending: General Surgery

## 2021-10-10 ENCOUNTER — Other Ambulatory Visit: Payer: Self-pay

## 2021-10-10 VITALS — Wt 181.2 lb

## 2021-10-10 DIAGNOSIS — Z483 Aftercare following surgery for neoplasm: Secondary | ICD-10-CM | POA: Insufficient documentation

## 2021-10-10 NOTE — Therapy (Signed)
[facility] @ [place], [place], [address] Phone: [phone]   Fax:  [phone]  Physical Therapy Treatment  Patient Details  Name: Samantha Mcbride MRN: [id number] Date of Birth: 02-27-1951 Referring Provider (PT): Dr. Marlou Starks   Encounter Date: 10/10/2021   PT End of Session - 10/10/21 1042     Visit Number 1   # unchanged due to screen only   PT Start Time 1037    PT Stop Time 1042    PT Time Calculation (min) 5 min    Activity Tolerance Patient tolerated treatment well    Behavior During Therapy [facility] for tasks assessed/performed             Past Medical History:  Diagnosis Date   Anemia    iron deficiency anemia    CAD S/P percutaneous coronary angioplasty November 2012   Promus DES - mid LAD 3.5 mm x 24 mm (3.72 mm)    Chronic kidney disease    stage 4 kidney disease per pt dx 12/8099   Complication of anesthesia    Dyslipidemia, goal LDL below 70     on statin, close to goal   Family history of breast cancer    Family history of skin cancer    Former moderate cigarette smoker (10-19 per day)    Glucose intolerance (impaired glucose tolerance)    H/O thyroid nodule    Benign   History 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 --> Promus DES =>  EF 40 to 45% (similar to 2012) stable wall motion normality (severe HK of midapical anterior apical wall-consistent with prior infarct).  GRII DD.  Normal valves. => Stable compared to 08/30/2011.  EF was still 40 to 45%.   Hypertension    PONV (postoperative nausea and vomiting)    no issues with surgery on 05-11-2019   Rheumatoid arthritis ([place])    managed on humira    SOB (shortness of breath) on exertion    reports "its been that way since my heart attack " repots no recurrence of MI sx since that time     Past Surgical History:  Procedure Laterality Date   ABDOMINAL  AORTAGRAM N/A 08/27/2011   Procedure: ABDOMINAL Maxcine Ham;  Surgeon: Troy Sine, MD;  Location: [facility] CATH LAB;  Service: Cardiovascular;  Laterality: N/A;   ANKLE SURGERY Right 1995   per pt ankle surgery here at [place] EXCISIONAL BIOPSY Left    BREAST LUMPECTOMY WITH RADIOACTIVE SEED AND SENTINEL LYMPH NODE BIOPSY Right 08/05/2020   Procedure: RIGHT BREAST LUMPECTOMY WITH RADIOACTIVE SEED AND SENTINEL LYMPH NODE BIOPSY;  Surgeon: [name], MD;  Location: [facility];  Service: General;  Laterality: Right;   CYSTOSCOPY WITH RETROGRADE PYELOGRAM, URETEROSCOPY AND STENT PLACEMENT Bilateral 05/11/2019   Procedure: CYSTOSCOPY WITH RETROGRADE PYELOGRAM,  AND STENT PLACEMENT;  Surgeon: Lucas Mallow, MD;  Location: [facility];  Service: Urology;  Laterality: Bilateral;   CYSTOSCOPY/URETEROSCOPY/HOLMIUM LASER/STENT PLACEMENT Bilateral 05/25/2019   Procedure: CYSTOSCOPY BILATERAL URETEROSCOPY/HOLMIUM LASER/STENT PLACEMENT;  Surgeon: Lucas Mallow, MD;  Location: [facility];  Service: Urology;  Laterality: Bilateral;   LEFT HEART CATHETERIZATION WITH CORONARY ANGIOGRAM N/A 08/27/2011   Procedure: LEFT HEART CATHETERIZATION WITH CORONARY ANGIOGRAM;  Surgeon: Troy Sine, MD;  Location: [facility] CATH LAB;  Service: Cardiovascular;;For anterior STEMI/cardiac arrest -- 100% mid LAD  occlusion   PERCUTANEOUS CORONARY STENT INTERVENTION (PCI-S) N/A 08/27/2011   Procedure: PERCUTANEOUS CORONARY STENT INTERVENTION (PCI-S);  Surgeon: Troy Sine, MD;  Location: [facility] C[facility] LAB;  Service: Cardiovascular;;  mid LAD PCI --> Promus Element DES 3.5 mm at 24 mm (3.72 mm)   TRANSTHORACIC ECHOCARDIOGRAM  08/2011   EF 40-45%, moderate at K. of mid and distal inferior septum and anterior apical myocardium. Grade 1 diastolic function. -- Followup echocardiogram to reassess his EF was denied by insurance company   TRANSTHORACIC ECHOCARDIOGRAM  07/20/2020    EF 40 to 45% (similar to 2012) stable wall  motion normality (severe HK of midapical anterior apical wall-consistent with prior infarct).  GRII DD.  Normal valves. => Stable compared to 08/30/2011.  EF was still 40 to 45%.    Vitals:   10/10/21 1039  Weight: 181 lb 4 oz (82.2 kg)     Subjective Assessment - 10/10/21 1039     Subjective Pt returns for her 3 month L-Dex screen.    Pertinent History Rt breast lumpectomy on 08/05/20 with 2 lymph nodes removed both negative.  Completed radiation.  On Anastrozole x 10/28/20 tolerating well                    L-DEX FLOWSHEETS - 10/10/21 1000       L-DEX LYMPHEDEMA SCREENING   Measurement Type Unilateral    L-DEX MEASUREMENT EXTREMITY Upper Extremity    POSITION  Standing    DOMINANT SIDE Right    At Risk Side Right    BASELINE SCORE (UNILATERAL) 7.4    L-DEX SCORE (UNILATERAL) 6.7    VALUE CHANGE (UNILAT) -0.7                                            Plan - 10/10/21 1042     Clinical Impression Statement Pt returns for her 3 month L-Dex screen. Her change from baseline of -0.7 is WNLs so no further treatment is required at this time except to cont every 3 month L-Dex screens which pt was agreeable to.    PT Next Visit Plan Cont every 3 month L-Dex screens for up to 2 years from her SLNB (~08/05/2022)    Consulted and Agree with Plan of Care Patient             Patient will benefit from skilled therapeutic intervention in order to improve the following deficits and impairments:     Visit Diagnosis: Aftercare following surgery for neoplasm     Problem List Patient Active Problem List   Diagnosis Date Noted   Breast cancer ([place]) 10/03/2021   Osteoarthritis 10/03/2021   Osteoporosis 07/10/2021   Thoracic aortic atherosclerosis (HCC)-seen on CT 05/11/2021   COPD (chronic obstructive pulmonary disease) with emphysema ([place]) noted on CT 05/11/2021   Age-related osteoporosis without current pathological fracture 02/02/2021    Chronic kidney disease, stage 4 (severe) ([place]) 02/02/2021   Insomnia 02/02/2021   Localized, primary osteoarthritis of hand 02/02/2021   Osteopenia 02/02/2021   Restless legs 02/02/2021   Rheumatoid arthritis ([place]) 02/02/2021   Sciatica 02/02/2021   Vitamin D deficiency 02/02/2021   Genetic testing 07/15/2020   Preoperative cardiovascular examination 07/11/2020   Family history of breast cancer    Family history of skin cancer    Malignant neoplasm of upper-outer quadrant of right breast in female, estrogen receptor  positive ([place]) 06/15/2020   Pain in left knee 05/28/2019   Ureteral calculus 05/11/2019   Obesity (BMI 30-39.9) 07/18/2013   Essential hypertension    Hyperlipidemia associated with type 2 diabetes mellitus ([place]) 07/16/2013   Glucose intolerance (impaired glucose tolerance) 07/16/2013   Ventricular tachycardia, sustained; Peri-infarct.  No further episodes 08/28/2011   Hypokalemia 08/27/2011   H/O Anterior STEMI:  Proximal LAD  insertion of a 3.5x24 mm Promus element DES (08/2011) 08/27/2011   CAD S/P percutaneous coronary angioplasty 08/16/2011    Otelia Limes, PTA 10/10/2021, 11:33 AM  [place] @ [place], [place], [address] Phone: [phone]   Fax:  [phone]  Name: Samantha Mcbride MRN: [id number] Date of Birth: 09-20-1951

## 2021-10-23 DIAGNOSIS — N184 Chronic kidney disease, stage 4 (severe): Secondary | ICD-10-CM | POA: Diagnosis not present

## 2021-10-23 DIAGNOSIS — M069 Rheumatoid arthritis, unspecified: Secondary | ICD-10-CM | POA: Diagnosis not present

## 2021-10-23 DIAGNOSIS — D631 Anemia in chronic kidney disease: Secondary | ICD-10-CM | POA: Diagnosis not present

## 2021-10-23 DIAGNOSIS — N189 Chronic kidney disease, unspecified: Secondary | ICD-10-CM | POA: Diagnosis not present

## 2021-10-23 DIAGNOSIS — I129 Hypertensive chronic kidney disease with stage 1 through stage 4 chronic kidney disease, or unspecified chronic kidney disease: Secondary | ICD-10-CM | POA: Diagnosis not present

## 2021-10-26 DIAGNOSIS — M5416 Radiculopathy, lumbar region: Secondary | ICD-10-CM | POA: Diagnosis not present

## 2021-10-30 ENCOUNTER — Other Ambulatory Visit: Payer: Self-pay | Admitting: Cardiology

## 2021-10-31 ENCOUNTER — Other Ambulatory Visit: Payer: Self-pay | Admitting: Cardiology

## 2021-10-31 MED ORDER — EZETIMIBE 10 MG PO TABS: 10.0000 mg | ORAL_TABLET | Freq: Every day | ORAL | 1 refills | Status: DC

## 2021-11-02 ENCOUNTER — Ambulatory Visit: Payer: Medicare Other | Admitting: Podiatry

## 2021-11-02 DIAGNOSIS — M5416 Radiculopathy, lumbar region: Secondary | ICD-10-CM | POA: Diagnosis not present

## 2021-11-02 DIAGNOSIS — M5137 Other intervertebral disc degeneration, lumbosacral region: Secondary | ICD-10-CM | POA: Diagnosis not present

## 2021-11-02 DIAGNOSIS — M79605 Pain in left leg: Secondary | ICD-10-CM | POA: Diagnosis not present

## 2021-11-02 DIAGNOSIS — M48061 Spinal stenosis, lumbar region without neurogenic claudication: Secondary | ICD-10-CM | POA: Diagnosis not present

## 2021-11-02 DIAGNOSIS — M545 Low back pain, unspecified: Secondary | ICD-10-CM | POA: Diagnosis not present

## 2021-11-05 ENCOUNTER — Other Ambulatory Visit: Payer: Self-pay | Admitting: Cardiology

## 2021-11-09 DIAGNOSIS — M5416 Radiculopathy, lumbar region: Secondary | ICD-10-CM | POA: Diagnosis not present

## 2021-11-14 DIAGNOSIS — M5430 Sciatica, unspecified side: Secondary | ICD-10-CM | POA: Diagnosis not present

## 2021-11-14 DIAGNOSIS — M810 Age-related osteoporosis without current pathological fracture: Secondary | ICD-10-CM | POA: Diagnosis not present

## 2021-11-14 DIAGNOSIS — M549 Dorsalgia, unspecified: Secondary | ICD-10-CM | POA: Diagnosis not present

## 2021-11-14 DIAGNOSIS — M0579 Rheumatoid arthritis with rheumatoid factor of multiple sites without organ or systems involvement: Secondary | ICD-10-CM | POA: Diagnosis not present

## 2021-11-14 DIAGNOSIS — M79643 Pain in unspecified hand: Secondary | ICD-10-CM | POA: Diagnosis not present

## 2021-11-14 DIAGNOSIS — Z79899 Other long term (current) drug therapy: Secondary | ICD-10-CM | POA: Diagnosis not present

## 2021-11-14 DIAGNOSIS — M1990 Unspecified osteoarthritis, unspecified site: Secondary | ICD-10-CM | POA: Diagnosis not present

## 2021-11-14 DIAGNOSIS — N189 Chronic kidney disease, unspecified: Secondary | ICD-10-CM | POA: Diagnosis not present

## 2021-11-14 DIAGNOSIS — D649 Anemia, unspecified: Secondary | ICD-10-CM | POA: Diagnosis not present
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
## 2021-11-22 ENCOUNTER — Ambulatory Visit: Payer: Medicare Other | Attending: General Surgery

## 2021-11-22 ENCOUNTER — Other Ambulatory Visit: Payer: Self-pay

## 2021-11-22 DIAGNOSIS — R2681 Unsteadiness on feet: Secondary | ICD-10-CM | POA: Insufficient documentation

## 2021-11-22 DIAGNOSIS — G8929 Other chronic pain: Secondary | ICD-10-CM | POA: Diagnosis not present

## 2021-11-22 DIAGNOSIS — M6281 Muscle weakness (generalized): Secondary | ICD-10-CM | POA: Insufficient documentation

## 2021-11-22 DIAGNOSIS — M545 Low back pain, unspecified: Secondary | ICD-10-CM | POA: Insufficient documentation

## 2021-11-25 ENCOUNTER — Other Ambulatory Visit: Payer: Self-pay | Admitting: Cardiology

## 2021-11-28 ENCOUNTER — Encounter (HOSPITAL_COMMUNITY): Payer: Self-pay

## 2021-11-29 NOTE — Therapy (Signed)
OUTPATIENT PHYSICAL THERAPY TREATMENT NOTE   Patient Name: Samantha Mcbride MRN: [id number] DOB:June 07, 1951, 71 y.o., female Today's Date: 11/30/2021  PCP: Antony Contras, MD REFERRING PROVIDER: Eustace Moore, MD   PT End of Session - 11/30/21 1121     Visit Number 2    Number of Visits 17    Date for PT Re-Evaluation 01/17/22    Authorization Type UHC MCR    Authorization Time Period FOTO v6,v10; KX mod at v15    Progress Note Due on Visit 10    PT Start Time 1130    PT Stop Time 1215    PT Time Calculation (min) 45 min    Activity Tolerance Patient tolerated treatment well    Behavior During Therapy [facility] for tasks assessed/performed             Past Medical History:  Diagnosis Date   Anemia    iron deficiency anemia    CAD S/P percutaneous coronary angioplasty 08/2011   Promus DES - mid LAD 3.5 mm x 24 mm (3.72 mm)    Cancer (HCC)    Chronic kidney disease    stage 4 kidney disease per pt dx 01/980   Complication of anesthesia    Dyslipidemia, goal LDL below 70     on statin, close to goal   Family history of breast cancer    Family history of skin cancer    Former moderate cigarette smoker (10-19 per day)    Glucose intolerance (impaired glucose tolerance)    H/O thyroid nodule    Benign   History 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 --> Promus DES =>  EF 40 to 45% (similar to 2012) stable wall motion normality (severe HK of midapical anterior apical wall-consistent with prior infarct).  GRII DD.  Normal valves. => Stable compared to 08/30/2011.  EF was still 40 to 45%.   Hypertension    PONV (postoperative nausea and vomiting)    no issues with surgery on 05-11-2019   Rheumatoid arthritis ([place])    managed on humira    SOB (shortness of breath) on exertion    reports "its been that way since my heart attack " repots no recurrence of MI sx since that time    Past Surgical History:   Procedure Laterality Date   ABDOMINAL AORTAGRAM N/A 08/27/2011   Procedure: ABDOMINAL Maxcine Ham;  Surgeon: Troy Sine, MD;  Location: [facility] CATH LAB;  Service: Cardiovascular;  Laterality: N/A;   ANKLE SURGERY Right 1995   per pt ankle surgery here at [place] EXCISIONAL BIOPSY Left    BREAST LUMPECTOMY WITH RADIOACTIVE SEED AND SENTINEL LYMPH NODE BIOPSY Right 08/05/2020   Procedure: RIGHT BREAST LUMPECTOMY WITH RADIOACTIVE SEED AND SENTINEL LYMPH NODE BIOPSY;  Surgeon: [name], MD;  Location: [place];  Service: General;  Laterality: Right;   CYSTOSCOPY WITH RETROGRADE PYELOGRAM, URETEROSCOPY AND STENT PLACEMENT Bilateral 05/11/2019   Procedure: CYSTOSCOPY WITH RETROGRADE PYELOGRAM,  AND STENT PLACEMENT;  Surgeon: Lucas Mallow, MD;  Location: [facility];  Service: Urology;  Laterality: Bilateral;   CYSTOSCOPY/URETEROSCOPY/HOLMIUM LASER/STENT PLACEMENT Bilateral 05/25/2019   Procedure: CYSTOSCOPY BILATERAL URETEROSCOPY/HOLMIUM LASER/STENT PLACEMENT;  Surgeon: Lucas Mallow, MD;  Location: [facility];  Service: Urology;  Laterality: Bilateral;   LEFT HEART CATHETERIZATION WITH CORONARY ANGIOGRAM N/A 08/27/2011   Procedure: LEFT HEART CATHETERIZATION WITH CORONARY ANGIOGRAM;  Surgeon: Troy Sine, MD;  Location: [facility] CATH LAB;  Service: Cardiovascular;;For anterior STEMI/cardiac arrest -- 100% mid LAD occlusion   PERCUTANEOUS CORONARY STENT INTERVENTION (PCI-S) N/A 08/27/2011   Procedure: PERCUTANEOUS CORONARY STENT INTERVENTION (PCI-S);  Surgeon: Troy Sine, MD;  Location: [facility] CATH LAB;  Service: Cardiovascular;;  mid LAD PCI --> Promus Element DES 3.5 mm at 24 mm (3.72 mm)   TRANSTHORACIC ECHOCARDIOGRAM  08/2011   EF 40-45%, moderate at K. of mid and distal inferior septum and anterior apical myocardium. Grade 1 diastolic function. -- Followup echocardiogram to reassess his EF was denied by insurance company   TRANSTHORACIC ECHOCARDIOGRAM  07/20/2020    EF 40  to 45% (similar to 2012) stable wall motion normality (severe HK of midapical anterior apical wall-consistent with prior infarct).  GRII DD.  Normal valves. => Stable compared to 08/30/2011.  EF was still 40 to 45%.   Patient Active Problem List   Diagnosis Date Noted   Breast cancer ([place]) 10/03/2021   Osteoarthritis 10/03/2021   Osteoporosis 07/10/2021   Thoracic aortic atherosclerosis (HCC)-seen on CT 05/11/2021   COPD (chronic obstructive pulmonary disease) with emphysema ([place]) noted on CT 05/11/2021   Age-related osteoporosis without current pathological fracture 02/02/2021   Chronic kidney disease, stage 4 (severe) ([place]) 02/02/2021   Insomnia 02/02/2021   Localized, primary osteoarthritis of hand 02/02/2021   Osteopenia 02/02/2021   Restless legs 02/02/2021   Rheumatoid arthritis ([place]) 02/02/2021   Sciatica 02/02/2021   Vitamin D deficiency 02/02/2021   Genetic testing 07/15/2020   Preoperative cardiovascular examination 07/11/2020   Family history of breast cancer    Family history of skin cancer    Malignant neoplasm of upper-outer quadrant of right breast in female, estrogen receptor positive ([place]) 06/15/2020   Pain in left knee 05/28/2019   Ureteral calculus 05/11/2019   Obesity (BMI 30-39.9) 07/18/2013   Essential hypertension    Hyperlipidemia associated with type 2 diabetes mellitus ([place]) 07/16/2013   Glucose intolerance (impaired glucose tolerance) 07/16/2013   Ventricular tachycardia, sustained; Peri-infarct.  No further episodes 08/28/2011   Hypokalemia 08/27/2011   H/O Anterior STEMI:  Proximal LAD  insertion of a 3.5x24 mm Promus element DES (08/2011) 08/27/2011   CAD S/P percutaneous coronary angioplasty 08/16/2011    REFERRING DIAG: M54.16 (ICD-10-CM) - Lumbar radiculopathy  THERAPY DIAG:  Chronic bilateral low back pain, unspecified whether sciatica present  Muscle weakness (generalized)  Unsteadiness on feet  PERTINENT HISTORY: Hx of breast cancer,  HTN  SUBJECTIVE: Pt reports that her back is feeling about the same as last week. She adds that she has been doing her HEP about every other day.  PAIN:  Are you having pain? Yes NPRS scale: 3-4/10 Pain location: Central low back, Lt lateral hip, anterior Lt thigh PAIN TYPE: aching, dull, and sharp Pain description: intermittent  Aggravating factors: prolonged sitting >10 minutes, prolonged standing/ walking >10 minutes, laying prone, and bending over Relieving factors: pain cream, heating pad, and laying on side     OBJECTIVE:   *Unless otherwise noted, objective information collected previously* DIAGNOSTIC FINDINGS:  None available currently   PATIENT SURVEYS:  FOTO 32%, predicted 51% in 12 visits   SCREENING FOR RED FLAGS: Bowel or bladder incontinence: No Cauda equina syndrome: No   COGNITION:          Overall cognitive status: Within functional limits for tasks assessed  SENSATION:          Light touch: Appears intact   MUSCLE LENGTH: Thomas test: Moderate limitation BIL   POSTURE:  Forward head/shoulder posture, increased lumbar lordosis   PALPATION: TTP to BIL Lt>Rt paraspinals/ QL Painful CPA's to T10-L5   LUMBAR AROM   A/PROM AROM  11/22/2021  Flexion 75%, painful on ascension  Extension 25%, painful  Right lateral flexion 50%  Left lateral flexion 50% minor pain  Right rotation 50%  Left rotation 50% with pain   (Blank rows = not tested)     LE MMT:   MMT Right 11/22/2021 Left 11/22/2021  Hip flexion 4/5 3/5  Hip extension 3/5 3/5  Hip abduction 4/5 3/5   (Blank rows = not tested)   LUMBAR SPECIAL TESTS:  Slump: (-) BIL SLR: (-) BIL PLE: (+)  Plank: (+) 4 seconds, terminated due to pain    FUNCTIONAL TESTS:  5xSTS: 23 seconds with use of hands and SPC Squat: 50% with SPC       TODAY'S TREATMENT   [facility] Adult PT Treatment:                                                DATE: 11/30/2021 Therapeutic  Exercise: Supine 90/90 abdominal isometric with handhold resistance 3x30 sec Hooklying lower trunk rotation stretch 2x10 BIL Standing abdominal press-down with 17# cable 3x10 Standing trunk extension 2x10 Seated dead lift with 10# kettlebell 2x10 Seated diagonal flexion stretch with reach 2x10 to each foot Standing Cybex hip abduction with 12.5# 2x10 BIL Standing Cybex hip extension with 12.5# 2x10 BIL Cybex leg press with 40# 3x8 Manual Therapy: Effleurage and tapotement to Rt lumbar paraspinals and QL Neuromuscular re-ed: N/A Therapeutic Activity: N/A Modalities: N/A Self Care: N/A   [facility] Adult PT Treatment:                                                DATE: 11/22/2021 HEP provided       PATIENT EDUCATION:  Education details: Pt educated on probable underlying pathophysiology behind her pain presentation, POC, prognosis, and HEP Person educated: Patient Education method: Consulting civil engineer, Media planner, and Handouts Education comprehension: verbalized understanding and returned demonstration     HOME EXERCISE PROGRAM: Access Code: [email or web address] URL: [email or web address] Date: 11/22/2021 Prepared by: Vanessa Morrison   Exercises Supine Bridge - 1 x daily - 7 x weekly - 3 sets - 10 reps - 3-sec hold Sidelying Hip Abduction - 1 x daily - 7 x weekly - 2 sets - 10 reps - 3-sec hold Abdominal Isometric Hold - FEET OFF TABLE* - 1 x daily - 7 x weekly - 4 sets - 30-sec hold Modified Thomas Stretch - 1 x daily - 7 x weekly - 2 sets - 1-min hold     ASSESSMENT:   CLINICAL IMPRESSION: Pt responded well to all interventions today, demonstrating good form and minor pain with selected exercises. Of note, she reports increased pain in supine 90/90 position. Because of this, exercises were performed primarily in standing and sitting from that point, and the pt responded better to these exercises. She also responded well to manual techniques performed today, reporting a  decrease in pain from 5/10 to 3/10 following treatment.  She will continue to benefit from skilled PT to address her primary impairments and return to her prior level of function with less limitation.   REHAB POTENTIAL: Good   CLINICAL DECISION MAKING: Stable/uncomplicated   EVALUATION COMPLEXITY: Low     GOALS: Goals reviewed with patient? Yes   SHORT TERM GOALS:   STG Name Target Date Goal status  1 Pt will report understanding and adherence to her HEP in order to promote independence in the management of her primary impairments. Baseline: HEP provided at eval 12/20/2021 INITIAL    LONG TERM GOALS:    LTG Name Target Date Goal status  1 Pt will achieve global lumbar AROM 80% of normal limits with 0-3/10 pain in order to get dressed with less limitation. Baseline: See AROM chart 01/17/2022 INITIAL  2 Pt will achieve a FOTO score of 51% in order to demonstrate improved functional ability as it relates to her concordant pain. Baseline: 32% 01/17/2022 INITIAL  3 Pt will achieve global BIL hip strength of 4+/5 or greater in order to return to working out at [employer] with less limitation. Baseline: See MMT Chart 01/17/2022 INITIAL  4 Pt will report ability to stand/ walk >30 minutes with 0-3/10 pain in order to grocery shop with less limitation. Baseline: >5/10 pain after 10 minutes of standing/ walking 01/17/2022 INITIAL  5 Pt will report ability to sit >30 minutes with 0-3/10 pain in order to drive with less limitation. Baseline: >5/10 pain after 10 minutes of sitting. 01/17/2022 INITIAL    PLAN: PT FREQUENCY: 2x/week   PT DURATION: 8 weeks   PLANNED INTERVENTIONS: Therapeutic exercises, Therapeutic activity, Neuro Muscular re-education, Balance training, Gait training, Patient/Family education, Joint mobilization, Dry Needling, Electrical stimulation, Spinal mobilization, Cryotherapy, Taping, Traction, and Manual therapy   PLAN FOR NEXT SESSION: Progress early core/ hip strengthening  and mobility    Vanessa Nambe, PT, DPT 11/30/21 12:12 PM

## 2021-11-30 ENCOUNTER — Other Ambulatory Visit: Payer: Self-pay

## 2021-11-30 ENCOUNTER — Ambulatory Visit: Payer: Medicare Other

## 2021-11-30 DIAGNOSIS — R2681 Unsteadiness on feet: Secondary | ICD-10-CM

## 2021-11-30 DIAGNOSIS — M545 Low back pain, unspecified: Secondary | ICD-10-CM

## 2021-11-30 DIAGNOSIS — M6281 Muscle weakness (generalized): Secondary | ICD-10-CM

## 2021-11-30 DIAGNOSIS — G8929 Other chronic pain: Secondary | ICD-10-CM | POA: Diagnosis not present

## 2021-11-30 NOTE — Therapy (Signed)
OUTPATIENT PHYSICAL THERAPY TREATMENT NOTE   Patient Name: Samantha Mcbride MRN: [id number] DOB:Mar 14, 1951, 71 y.o., female Today's Date: 12/01/2021  PCP: Antony Contras, MD REFERRING PROVIDER: Antony Contras, MD   PT End of Session - 12/01/21 1048     Visit Number 3    Number of Visits 17    Date for PT Re-Evaluation 01/17/22    Authorization Type UHC MCR    Authorization Time Period FOTO v6,v10; KX mod at v15    Progress Note Due on Visit 10    PT Start Time 1048    PT Stop Time 1130    PT Time Calculation (min) 42 min    Activity Tolerance Patient tolerated treatment well    Behavior During Therapy [facility] for tasks assessed/performed              Past Medical History:  Diagnosis Date   Anemia    iron deficiency anemia    CAD S/P percutaneous coronary angioplasty 08/2011   Promus DES - mid LAD 3.5 mm x 24 mm (3.72 mm)    Cancer (HCC)    Chronic kidney disease    stage 4 kidney disease per pt dx 06/4708   Complication of anesthesia    Dyslipidemia, goal LDL below 70     on statin, close to goal   Family history of breast cancer    Family history of skin cancer    Former moderate cigarette smoker (10-19 per day)    Glucose intolerance (impaired glucose tolerance)    H/O thyroid nodule    Benign   History 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 --> Promus DES =>  EF 40 to 45% (similar to 2012) stable wall motion normality (severe HK of midapical anterior apical wall-consistent with prior infarct).  GRII DD.  Normal valves. => Stable compared to 08/30/2011.  EF was still 40 to 45%.   Hypertension    PONV (postoperative nausea and vomiting)    no issues with surgery on 05-11-2019   Rheumatoid arthritis ([place])    managed on humira    SOB (shortness of breath) on exertion    reports "its been that way since my heart attack " repots no recurrence of MI sx since that time    Past Surgical History:   Procedure Laterality Date   ABDOMINAL AORTAGRAM N/A 08/27/2011   Procedure: ABDOMINAL Maxcine Ham;  Surgeon: Troy Sine, MD;  Location: [facility] CATH LAB;  Service: Cardiovascular;  Laterality: N/A;   ANKLE SURGERY Right 1995   per pt ankle surgery here at [place] EXCISIONAL BIOPSY Left    BREAST LUMPECTOMY WITH RADIOACTIVE SEED AND SENTINEL LYMPH NODE BIOPSY Right 08/05/2020   Procedure: RIGHT BREAST LUMPECTOMY WITH RADIOACTIVE SEED AND SENTINEL LYMPH NODE BIOPSY;  Surgeon: [name], MD;  Location: [place];  Service: General;  Laterality: Right;   CYSTOSCOPY WITH RETROGRADE PYELOGRAM, URETEROSCOPY AND STENT PLACEMENT Bilateral 05/11/2019   Procedure: CYSTOSCOPY WITH RETROGRADE PYELOGRAM,  AND STENT PLACEMENT;  Surgeon: Lucas Mallow, MD;  Location: [facility];  Service: Urology;  Laterality: Bilateral;   CYSTOSCOPY/URETEROSCOPY/HOLMIUM LASER/STENT PLACEMENT Bilateral 05/25/2019   Procedure: CYSTOSCOPY BILATERAL URETEROSCOPY/HOLMIUM LASER/STENT PLACEMENT;  Surgeon: Lucas Mallow, MD;  Location: [facility];  Service: Urology;  Laterality: Bilateral;   LEFT HEART CATHETERIZATION WITH CORONARY ANGIOGRAM N/A 08/27/2011   Procedure: LEFT HEART CATHETERIZATION WITH CORONARY ANGIOGRAM;  Surgeon: Troy Sine, MD;  Location: [facility] CATH LAB;  Service: Cardiovascular;;For anterior STEMI/cardiac arrest -- 100% mid LAD occlusion   PERCUTANEOUS CORONARY STENT INTERVENTION (PCI-S) N/A 08/27/2011   Procedure: PERCUTANEOUS CORONARY STENT INTERVENTION (PCI-S);  Surgeon: Troy Sine, MD;  Location: [facility] CATH LAB;  Service: Cardiovascular;;  mid LAD PCI --> Promus Element DES 3.5 mm at 24 mm (3.72 mm)   TRANSTHORACIC ECHOCARDIOGRAM  08/2011   EF 40-45%, moderate at K. of mid and distal inferior septum and anterior apical myocardium. Grade 1 diastolic function. -- Followup echocardiogram to reassess his EF was denied by insurance company   TRANSTHORACIC ECHOCARDIOGRAM  07/20/2020    EF 40  to 45% (similar to 2012) stable wall motion normality (severe HK of midapical anterior apical wall-consistent with prior infarct).  GRII DD.  Normal valves. => Stable compared to 08/30/2011.  EF was still 40 to 45%.   Patient Active Problem List   Diagnosis Date Noted   Breast cancer ([place]) 10/03/2021   Osteoarthritis 10/03/2021   Osteoporosis 07/10/2021   Thoracic aortic atherosclerosis (HCC)-seen on CT 05/11/2021   COPD (chronic obstructive pulmonary disease) with emphysema ([place]) noted on CT 05/11/2021   Age-related osteoporosis without current pathological fracture 02/02/2021   Chronic kidney disease, stage 4 (severe) ([place]) 02/02/2021   Insomnia 02/02/2021   Localized, primary osteoarthritis of hand 02/02/2021   Osteopenia 02/02/2021   Restless legs 02/02/2021   Rheumatoid arthritis ([place]) 02/02/2021   Sciatica 02/02/2021   Vitamin D deficiency 02/02/2021   Genetic testing 07/15/2020   Preoperative cardiovascular examination 07/11/2020   Family history of breast cancer    Family history of skin cancer    Malignant neoplasm of upper-outer quadrant of right breast in female, estrogen receptor positive ([place]) 06/15/2020   Pain in left knee 05/28/2019   Ureteral calculus 05/11/2019   Obesity (BMI 30-39.9) 07/18/2013   Essential hypertension    Hyperlipidemia associated with type 2 diabetes mellitus ([place]) 07/16/2013   Glucose intolerance (impaired glucose tolerance) 07/16/2013   Ventricular tachycardia, sustained; Peri-infarct.  No further episodes 08/28/2011   Hypokalemia 08/27/2011   H/O Anterior STEMI:  Proximal LAD  insertion of a 3.5x24 mm Promus element DES (08/2011) 08/27/2011   CAD S/P percutaneous coronary angioplasty 08/16/2011    REFERRING DIAG: M54.16 (ICD-10-CM) - Lumbar radiculopathy  THERAPY DIAG:  Chronic bilateral low back pain, unspecified whether sciatica present  Muscle weakness (generalized)  Unsteadiness on feet  PERTINENT HISTORY: Hx of breast cancer,  HTN  SUBJECTIVE: Pt reports 4/10 LBP today and increased soreness today from her treatment session yesterday. She also reports Lt hip and knee pain today.   PAIN:  Are you having pain? Yes NPRS scale: 4/10 Pain location: Central low back, Lt lateral hip, anterior Lt thigh PAIN TYPE: aching, dull, and sharp Pain description: intermittent  Aggravating factors: prolonged sitting >10 minutes, prolonged standing/ walking >10 minutes, laying prone, and bending over Relieving factors: pain cream, heating pad, and laying on side     OBJECTIVE:   *Unless otherwise noted, objective information collected previously* DIAGNOSTIC FINDINGS:  None available currently   PATIENT SURVEYS:  FOTO 32%, predicted 51% in 12 visits   SCREENING FOR RED FLAGS: Bowel or bladder incontinence: No Cauda equina syndrome: No   COGNITION:          Overall cognitive status: Within functional limits for tasks assessed  SENSATION:          Light touch: Appears intact   MUSCLE LENGTH: Thomas test: Moderate limitation BIL   POSTURE:  Forward head/shoulder posture, increased lumbar lordosis   PALPATION: TTP to BIL Lt>Rt paraspinals/ QL Painful CPA's to T10-L5   LUMBAR AROM   A/PROM AROM  11/22/2021  Flexion 75%, painful on ascension  Extension 25%, painful  Right lateral flexion 50%  Left lateral flexion 50% minor pain  Right rotation 50%  Left rotation 50% with pain   (Blank rows = not tested)     LE MMT:   MMT Right 11/22/2021 Left 11/22/2021  Hip flexion 4/5 3/5  Hip extension 3/5 3/5  Hip abduction 4/5 3/5   (Blank rows = not tested)   LUMBAR SPECIAL TESTS:  Slump: (-) BIL SLR: (-) BIL PLE: (+)  Plank: (+) 4 seconds, terminated due to pain    FUNCTIONAL TESTS:  5xSTS: 23 seconds with use of hands and SPC Squat: 50% with SPC       TODAY'S TREATMENT   [facility] Adult PT Treatment:                                                DATE: 12/01/2021 Therapeutic  Exercise: Bridge with adduction ball squeeze 3x10 DKTC stretch 3x30 sec Supine hip flexor stretch with leg off side of table x2 min BIL Seated Pallof Press with 10# cable 2x10 with 5-sec hold BIL Seated lumbar rotation stretch 3x10 with 3-sec hold BIL Seated dead lift with 15# kettlebell 3x8 Seated abdominal press-down with 13# cable 3x10 Seated diagonal flexion stretch with reach 2x10 to each foot Manual Therapy: Effleurage and tapotement to Rt lumbar paraspinals and QL Neuromuscular re-ed: N/A Therapeutic Activity: N/A Modalities: N/A Self Care: N/A   [facility] Adult PT Treatment:                                                DATE: 11/30/2021 Therapeutic Exercise: Supine 90/90 abdominal isometric with handhold resistance 3x30 sec Hooklying lower trunk rotation stretch 2x10 BIL Standing abdominal press-down with 17# cable 3x10 Standing trunk extension 2x10 Seated dead lift with 10# kettlebell 2x10 Seated diagonal flexion stretch with reach 2x10 to each foot Standing Cybex hip abduction with 12.5# 2x10 BIL Standing Cybex hip extension with 12.5# 2x10 BIL Cybex leg press with 40# 3x8 Manual Therapy: Effleurage and tapotement to Rt lumbar paraspinals and QL Neuromuscular re-ed: N/A Therapeutic Activity: N/A Modalities: N/A Self Care: N/A   [facility] Adult PT Treatment:                                                DATE: 11/22/2021 HEP provided       PATIENT EDUCATION:  Education details: Pt instructed to continue her HEP daily Person educated: Patient Education method: Explanation, Demonstration, and Handouts Education comprehension: verbalized understanding and returned demonstration     HOME EXERCISE PROGRAM: Access Code: [email or web address] URL: [email or web address] Date: 11/22/2021 Prepared by: Vanessa Holliday   Exercises Supine Bridge - 1 x daily - 7 x weekly - 3 sets - 10 reps -  3-sec hold Sidelying Hip Abduction - 1 x daily - 7 x weekly - 2 sets - 10  reps - 3-sec hold Abdominal Isometric Hold - FEET OFF TABLE* - 1 x daily - 7 x weekly - 4 sets - 30-sec hold Modified Thomas Stretch - 1 x daily - 7 x weekly - 2 sets - 1-min hold     ASSESSMENT:   CLINICAL IMPRESSION: Pt responded well to all exercises today, demonstrating good form with all exercises and only experiencing increased pain with seated dead lifts. She again responded well to lumbar manual therapy, reporting decrease in pain from 4/10 to 2/10 following treatment. However, her Lt hip pain continued at baseline throughout the session. She will continue to benefit from skilled PT to address her primary impairments and return to her prior level of function with less limitation.   REHAB POTENTIAL: Good   CLINICAL DECISION MAKING: Stable/uncomplicated   EVALUATION COMPLEXITY: Low     GOALS: Goals reviewed with patient? Yes   SHORT TERM GOALS:   STG Name Target Date Goal status  1 Pt will report understanding and adherence to her HEP in order to promote independence in the management of her primary impairments. Baseline: HEP provided at eval 12/20/2021 INITIAL    LONG TERM GOALS:    LTG Name Target Date Goal status  1 Pt will achieve global lumbar AROM 80% of normal limits with 0-3/10 pain in order to get dressed with less limitation. Baseline: See AROM chart 01/17/2022 INITIAL  2 Pt will achieve a FOTO score of 51% in order to demonstrate improved functional ability as it relates to her concordant pain. Baseline: 32% 01/17/2022 INITIAL  3 Pt will achieve global BIL hip strength of 4+/5 or greater in order to return to working out at [employer] with less limitation. Baseline: See MMT Chart 01/17/2022 INITIAL  4 Pt will report ability to stand/ walk >30 minutes with 0-3/10 pain in order to grocery shop with less limitation. Baseline: >5/10 pain after 10 minutes of standing/ walking 01/17/2022 INITIAL  5 Pt will report ability to sit >30 minutes with 0-3/10 pain in order to drive  with less limitation. Baseline: >5/10 pain after 10 minutes of sitting. 01/17/2022 INITIAL    PLAN: PT FREQUENCY: 2x/week   PT DURATION: 8 weeks   PLANNED INTERVENTIONS: Therapeutic exercises, Therapeutic activity, Neuro Muscular re-education, Balance training, Gait training, Patient/Family education, Joint mobilization, Dry Needling, Electrical stimulation, Spinal mobilization, Cryotherapy, Taping, Traction, and Manual therapy   PLAN FOR NEXT SESSION: Progress early core/ hip strengthening and mobility    Vanessa [place], PT, DPT 12/01/21 11:33 AM

## 2021-12-01 ENCOUNTER — Ambulatory Visit: Payer: Medicare Other

## 2021-12-01 DIAGNOSIS — G8929 Other chronic pain: Secondary | ICD-10-CM | POA: Diagnosis not present

## 2021-12-01 DIAGNOSIS — M6281 Muscle weakness (generalized): Secondary | ICD-10-CM | POA: Diagnosis not present

## 2021-12-01 DIAGNOSIS — R2681 Unsteadiness on feet: Secondary | ICD-10-CM | POA: Diagnosis not present

## 2021-12-01 DIAGNOSIS — M545 Low back pain, unspecified: Secondary | ICD-10-CM

## 2021-12-05 ENCOUNTER — Telehealth: Payer: Self-pay

## 2021-12-05 DIAGNOSIS — I10 Essential (primary) hypertension: Secondary | ICD-10-CM | POA: Diagnosis not present

## 2021-12-05 DIAGNOSIS — Z7901 Long term (current) use of anticoagulants: Secondary | ICD-10-CM | POA: Diagnosis not present

## 2021-12-05 DIAGNOSIS — M5416 Radiculopathy, lumbar region: Secondary | ICD-10-CM | POA: Diagnosis not present

## 2021-12-05 NOTE — Telephone Encounter (Signed)
° °  Name: Samantha Mcbride  DOB: 1951/07/07  MRN: [id number]   Primary Cardiologist: Glenetta Hew, MD  Chart reviewed as part of pre-operative protocol coverage. Patient was contacted 12/05/2021 in reference to pre-operative risk assessment for pending surgery as outlined below.  Samantha Mcbride was last seen on 05/11/2021 by Dr. Ellyn Hack. Since that day, Samantha Mcbride has done well overall from a cardiac standpoint. She denies any symptoms concerning for angina.  According to the Revised Cardiac Risk Index (RCRI), her perioperative risk of major cardiac event is 6.6%. Her METs are 3.63 according to the Duke Activity Status Index (DASI). Patient's activity is greatly limited in the setting of chronic back pain for which she is undergoing physical therapy and requires upcoming epidural injection. She denies cardiac symptoms that limit her activity. Therefore, based on ACC/AHA guidelines, the patient would be at acceptable risk for the planned procedure without further cardiovascular testing.   The patient was advised that if she develops new symptoms prior to surgery to contact our office to arrange for a follow-up visit, and she verbalized understanding.  Per Dr. Allison Quarry prior notes, okay to hold Plavix for 7 days prior to procedure. Please resume Plavix as soon as possible postprocedure at the discretion of the surgeon.  I will route this recommendation to the requesting party via Epic fax function and remove from pre-op pool. Please call with questions.  Lenna Sciara, NP 12/05/2021, 4:40 PM

## 2021-12-05 NOTE — Therapy (Incomplete)
OUTPATIENT PHYSICAL THERAPY TREATMENT NOTE   Patient Name: Samantha Mcbride MRN: [id number] DOB:28-Jul-1951, 71 y.o., female Today's Date: 12/05/2021  PCP: Antony Contras, MD REFERRING PROVIDER: Eustace Moore, MD      Past Medical History:  Diagnosis Date   Anemia    iron deficiency anemia    CAD S/P percutaneous coronary angioplasty 08/2011   Promus DES - mid LAD 3.5 mm x 24 mm (3.72 mm)    Cancer ([place])    Chronic kidney disease    stage 4 kidney disease per pt dx 01/980   Complication of anesthesia    Dyslipidemia, goal LDL below 70     on statin, close to goal   Family history of breast cancer    Family history of skin cancer    Former moderate cigarette smoker (10-19 per day)    Glucose intolerance (impaired glucose tolerance)    H/O thyroid nodule    Benign   History 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 --> Promus DES =>  EF 40 to 45% (similar to 2012) stable wall motion normality (severe HK of midapical anterior apical wall-consistent with prior infarct).  GRII DD.  Normal valves. => Stable compared to 08/30/2011.  EF was still 40 to 45%.   Hypertension    PONV (postoperative nausea and vomiting)    no issues with surgery on 05-11-2019   Rheumatoid arthritis ([place])    managed on humira    SOB (shortness of breath) on exertion    reports "its been that way since my heart attack " repots no recurrence of MI sx since that time    Past Surgical History:  Procedure Laterality Date   ABDOMINAL AORTAGRAM N/A 08/27/2011   Procedure: ABDOMINAL Maxcine Ham;  Surgeon: Troy Sine, MD;  Location: [facility] CATH LAB;  Service: Cardiovascular;  Laterality: N/A;   ANKLE SURGERY Right 1995   per pt ankle surgery here at [facility] EXCISIONAL BIOPSY Left    BREAST LUMPECTOMY WITH RADIOACTIVE SEED AND SENTINEL LYMPH NODE BIOPSY Right 08/05/2020   Procedure: RIGHT BREAST LUMPECTOMY WITH  RADIOACTIVE SEED AND SENTINEL LYMPH NODE BIOPSY;  Surgeon: [name], MD;  Location: [facility];  Service: General;  Laterality: Right;   CYSTOSCOPY WITH RETROGRADE PYELOGRAM, URETEROSCOPY AND STENT PLACEMENT Bilateral 05/11/2019   Procedure: CYSTOSCOPY WITH RETROGRADE PYELOGRAM,  AND STENT PLACEMENT;  Surgeon: Lucas Mallow, MD;  Location: [facility]S;  Service: Urology;  Laterality: Bilateral;   CYSTOSCOPY/URETEROSCOPY/HOLMIUM LASER/STENT PLACEMENT Bilateral 05/25/2019   Procedure: CYSTOSCOPY BILATERAL URETEROSCOPY/HOLMIUM LASER/STENT PLACEMENT;  Surgeon: Lucas Mallow, MD;  Location: [facility];  Service: Urology;  Laterality: Bilateral;   LEFT HEART CATHETERIZATION WITH CORONARY ANGIOGRAM N/A 08/27/2011   Procedure: LEFT HEART CATHETERIZATION WITH CORONARY ANGIOGRAM;  Surgeon: Troy Sine, MD;  Location: [facility] CATH LAB;  Service: Cardiovascular;;For anterior STEMI/cardiac arrest -- 100% mid LAD occlusion   PERCUTANEOUS CORONARY STENT INTERVENTION (PCI-S) N/A 08/27/2011   Procedure: PERCUTANEOUS CORONARY STENT INTERVENTION (PCI-S);  Surgeon: Troy Sine, MD;  Location: [facility] CATH LAB;  Service: Cardiovascular;;  mid LAD PCI --> Promus Element DES 3.5 mm at 24 mm (3.72 mm)   TRANSTHORACIC ECHOCARDIOGRAM  08/2011   EF 40-45%, moderate at K. of mid and distal inferior septum and anterior apical myocardium. Grade 1 diastolic function. -- Followup echocardiogram to reassess his EF was denied by insurance company  TRANSTHORACIC ECHOCARDIOGRAM  07/20/2020    EF 40 to 45% (similar to 2012) stable wall motion normality (severe HK of midapical anterior apical wall-consistent with prior infarct).  GRII DD.  Normal valves. => Stable compared to 08/30/2011.  EF was still 40 to 45%.   Patient Active Problem List   Diagnosis Date Noted   Breast cancer ([place]) 10/03/2021   Osteoarthritis 10/03/2021   Osteoporosis 07/10/2021   Thoracic aortic atherosclerosis (HCC)-seen on CT 05/11/2021   COPD (chronic  obstructive pulmonary disease) with emphysema ([place]) noted on CT 05/11/2021   Age-related osteoporosis without current pathological fracture 02/02/2021   Chronic kidney disease, stage 4 (severe) ([place]) 02/02/2021   Insomnia 02/02/2021   Localized, primary osteoarthritis of hand 02/02/2021   Osteopenia 02/02/2021   Restless legs 02/02/2021   Rheumatoid arthritis ([place]) 02/02/2021   Sciatica 02/02/2021   Vitamin D deficiency 02/02/2021   Genetic testing 07/15/2020   Preoperative cardiovascular examination 07/11/2020   Family history of breast cancer    Family history of skin cancer    Malignant neoplasm of upper-outer quadrant of right breast in female, estrogen receptor positive ([place]) 06/15/2020   Pain in left knee 05/28/2019   Ureteral calculus 05/11/2019   Obesity (BMI 30-39.9) 07/18/2013   Essential hypertension    Hyperlipidemia associated with type 2 diabetes mellitus ([place]) 07/16/2013   Glucose intolerance (impaired glucose tolerance) 07/16/2013   Ventricular tachycardia, sustained; Peri-infarct.  No further episodes 08/28/2011   Hypokalemia 08/27/2011   H/O Anterior STEMI:  Proximal LAD  insertion of a 3.5x24 mm Promus element DES (08/2011) 08/27/2011   CAD S/P percutaneous coronary angioplasty 08/16/2011    REFERRING DIAG: M54.16 (ICD-10-CM) - Lumbar radiculopathy  THERAPY DIAG:  No diagnosis found.  PERTINENT HISTORY: Hx of breast cancer, HTN  SUBJECTIVE: ***  PAIN:  Are you having pain? Yes NPRS scale: 4/10 Pain location: Central low back, Lt lateral hip, anterior Lt thigh PAIN TYPE: aching, dull, and sharp Pain description: intermittent  Aggravating factors: prolonged sitting >10 minutes, prolonged standing/ walking >10 minutes, laying prone, and bending over Relieving factors: pain cream, heating pad, and laying on side     OBJECTIVE:   *Unless otherwise noted, objective information collected previously* DIAGNOSTIC FINDINGS:  None available currently    PATIENT SURVEYS:  FOTO 32%, predicted 51% in 12 visits   SCREENING FOR RED FLAGS: Bowel or bladder incontinence: No Cauda equina syndrome: No   COGNITION:          Overall cognitive status: Within functional limits for tasks assessed                        SENSATION:          Light touch: Appears intact   MUSCLE LENGTH: Thomas test: Moderate limitation BIL   POSTURE:  Forward head/shoulder posture, increased lumbar lordosis   PALPATION: TTP to BIL Lt>Rt paraspinals/ QL Painful CPA's to T10-L5   LUMBAR AROM   A/PROM AROM  11/22/2021  Flexion 75%, painful on ascension  Extension 25%, painful  Right lateral flexion 50%  Left lateral flexion 50% minor pain  Right rotation 50%  Left rotation 50% with pain   (Blank rows = not tested)     LE MMT:   MMT Right 11/22/2021 Left 11/22/2021  Hip flexion 4/5 3/5  Hip extension 3/5 3/5  Hip abduction 4/5 3/5   (Blank rows = not tested)   LUMBAR SPECIAL TESTS:  Slump: (-) BIL SLR: (-) BIL PLE: (+)  Plank: (+) 4 seconds, terminated due to pain    FUNCTIONAL TESTS:  5xSTS: 23 seconds with use of hands and SPC Squat: 50% with SPC       TODAY'S TREATMENT   [facility] Adult PT Treatment:                                                DATE: 12/06/2021 Therapeutic Exercise: *** Manual Therapy: *** Neuromuscular re-ed: *** Therapeutic Activity: *** Modalities: *** Self Care: ***   [facility] Adult PT Treatment:                                                DATE: 12/01/2021 Therapeutic Exercise: Bridge with adduction ball squeeze 3x10 DKTC stretch 3x30 sec Supine hip flexor stretch with leg off side of table x2 min BIL Seated Pallof Press with 10# cable 2x10 with 5-sec hold BIL Seated lumbar rotation stretch 3x10 with 3-sec hold BIL Seated dead lift with 15# kettlebell 3x8 Seated abdominal press-down with 13# cable 3x10 Seated diagonal flexion stretch with reach 2x10 to each foot Manual Therapy: Effleurage and tapotement  to Rt lumbar paraspinals and QL Neuromuscular re-ed: N/A Therapeutic Activity: N/A Modalities: N/A Self Care: N/A   [facility] Adult PT Treatment:                                                DATE: 11/30/2021 Therapeutic Exercise: Supine 90/90 abdominal isometric with handhold resistance 3x30 sec Hooklying lower trunk rotation stretch 2x10 BIL Standing abdominal press-down with 17# cable 3x10 Standing trunk extension 2x10 Seated dead lift with 10# kettlebell 2x10 Seated diagonal flexion stretch with reach 2x10 to each foot Standing Cybex hip abduction with 12.5# 2x10 BIL Standing Cybex hip extension with 12.5# 2x10 BIL Cybex leg press with 40# 3x8 Manual Therapy: Effleurage and tapotement to Rt lumbar paraspinals and QL Neuromuscular re-ed: N/A Therapeutic Activity: N/A Modalities: N/A Self Care: N/A        PATIENT EDUCATION:  Education details: Pt instructed to continue her HEP daily Person educated: Patient Education method: Explanation, Demonstration, and Handouts Education comprehension: verbalized understanding and returned demonstration     HOME EXERCISE PROGRAM: Access Code: [email or web address] URL: [email or web address] Date: 11/22/2021 Prepared by: Vanessa Egan   Exercises Supine Bridge - 1 x daily - 7 x weekly - 3 sets - 10 reps - 3-sec hold Sidelying Hip Abduction - 1 x daily - 7 x weekly - 2 sets - 10 reps - 3-sec hold Abdominal Isometric Hold - FEET OFF TABLE* - 1 x daily - 7 x weekly - 4 sets - 30-sec hold Modified Thomas Stretch - 1 x daily - 7 x weekly - 2 sets - 1-min hold     ASSESSMENT:   CLINICAL IMPRESSION: ***   REHAB POTENTIAL: Good   CLINICAL DECISION MAKING: Stable/uncomplicated   EVALUATION COMPLEXITY: Low     GOALS: Goals reviewed with patient? Yes   SHORT TERM GOALS:   STG Name Target Date Goal status  1 Pt will report understanding and adherence to her HEP in order to promote independence in the management  of  her primary impairments. Baseline: HEP provided at eval 12/20/2021 INITIAL    LONG TERM GOALS:    LTG Name Target Date Goal status  1 Pt will achieve global lumbar AROM 80% of normal limits with 0-3/10 pain in order to get dressed with less limitation. Baseline: See AROM chart 01/17/2022 INITIAL  2 Pt will achieve a FOTO score of 51% in order to demonstrate improved functional ability as it relates to her concordant pain. Baseline: 32% 01/17/2022 INITIAL  3 Pt will achieve global BIL hip strength of 4+/5 or greater in order to return to working out at [employer] with less limitation. Baseline: See MMT Chart 01/17/2022 INITIAL  4 Pt will report ability to stand/ walk >30 minutes with 0-3/10 pain in order to grocery shop with less limitation. Baseline: >5/10 pain after 10 minutes of standing/ walking 01/17/2022 INITIAL  5 Pt will report ability to sit >30 minutes with 0-3/10 pain in order to drive with less limitation. Baseline: >5/10 pain after 10 minutes of sitting. 01/17/2022 INITIAL    PLAN: PT FREQUENCY: 2x/week   PT DURATION: 8 weeks   PLANNED INTERVENTIONS: Therapeutic exercises, Therapeutic activity, Neuro Muscular re-education, Balance training, Gait training, Patient/Family education, Joint mobilization, Dry Needling, Electrical stimulation, Spinal mobilization, Cryotherapy, Taping, Traction, and Manual therapy   PLAN FOR NEXT SESSION: Progress early core/ hip strengthening and mobility    Vanessa [place], PT, DPT 12/05/21 2:51 PM

## 2021-12-05 NOTE — Telephone Encounter (Signed)
° °  Pre-operative Risk Assessment    Patient Name: Samantha Mcbride  DOB: [birth year] MRN: [id number]      Request for Surgical Clearance    Procedure:   Epidural Steroid Injection   Date of Surgery:  Clearance 01/01/22                                 Surgeon:  Dr. Lenord Carbo  Surgeon's Group or Practice Name:  [facility] Phone number:  [phone] Fax number:  [phone]   Type of Clearance Requested:   - Medical  - Pharmacy:  Hold Clopidogrel (Plavix) hold for 7 days prior to procedure    Type of Anesthesia:  Not Indicated   Additional requests/questions:  Please fax a copy of clearance to the surgeon's office.  Signed, Jacqulynn Cadet   12/05/2021, 3:38 PM

## 2021-12-06 ENCOUNTER — Ambulatory Visit: Payer: Medicare Other

## 2021-12-06 ENCOUNTER — Other Ambulatory Visit: Payer: Self-pay

## 2021-12-06 DIAGNOSIS — M545 Low back pain, unspecified: Secondary | ICD-10-CM

## 2021-12-06 DIAGNOSIS — M6281 Muscle weakness (generalized): Secondary | ICD-10-CM | POA: Diagnosis not present

## 2021-12-06 DIAGNOSIS — G8929 Other chronic pain: Secondary | ICD-10-CM | POA: Diagnosis not present

## 2021-12-06 DIAGNOSIS — R2681 Unsteadiness on feet: Secondary | ICD-10-CM

## 2021-12-06 NOTE — Therapy (Signed)
OUTPATIENT PHYSICAL THERAPY TREATMENT NOTE   Patient Name: Samantha Mcbride MRN: [id number] DOB:1951/03/16, 71 y.o., female Today's Date: 12/06/2021  PCP: Antony Contras, MD REFERRING PROVIDER: Eustace Moore, MD   PT End of Session - 12/06/21 1123     Visit Number 4    Number of Visits 17    Date for PT Re-Evaluation 01/17/22    Authorization Type [employer] MCR    Authorization Time Period FOTO v6,v10; KX mod at v15    Progress Note Due on Visit 10    PT Start Time 1130    PT Stop Time 1215    PT Time Calculation (min) 45 min    Activity Tolerance Patient tolerated treatment well    Behavior During Therapy [facility] for tasks assessed/performed               Past Medical History:  Diagnosis Date   Anemia    iron deficiency anemia    CAD S/P percutaneous coronary angioplasty 08/2011   Promus DES - mid LAD 3.5 mm x 24 mm (3.72 mm)    Cancer (HCC)    Chronic kidney disease    stage 4 kidney disease per pt dx 04/6194   Complication of anesthesia    Dyslipidemia, goal LDL below 70     on statin, close to goal   Family history of breast cancer    Family history of skin cancer    Former moderate cigarette smoker (10-19 per day)    Glucose intolerance (impaired glucose tolerance)    H/O thyroid nodule    Benign   History 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 --> Promus DES =>  EF 40 to 45% (similar to 2012) stable wall motion normality (severe HK of midapical anterior apical wall-consistent with prior infarct).  GRII DD.  Normal valves. => Stable compared to 08/30/2011.  EF was still 40 to 45%.   Hypertension    PONV (postoperative nausea and vomiting)    no issues with surgery on 05-11-2019   Rheumatoid arthritis ([place])    managed on humira    SOB (shortness of breath) on exertion    reports "its been that way since my heart attack " repots no recurrence of MI sx since that time    Past Surgical  History:  Procedure Laterality Date   ABDOMINAL AORTAGRAM N/A 08/27/2011   Procedure: ABDOMINAL Maxcine Ham;  Surgeon: Troy Sine, MD;  Location: [facility] C[facility] LAB;  Service: Cardiovascular;  Laterality: N/A;   ANKLE SURGERY Right 1995   per pt ankle surgery here at [place] EXCISIONAL BIOPSY Left    BREAST LUMPECTOMY WITH RADIOACTIVE SEED AND SENTINEL LYMPH NODE BIOPSY Right 08/05/2020   Procedure: RIGHT BREAST LUMPECTOMY WITH RADIOACTIVE SEED AND SENTINEL LYMPH NODE BIOPSY;  Surgeon: [name], MD;  Location: [place];  Service: General;  Laterality: Right;   CYSTOSCOPY WITH RETROGRADE PYELOGRAM, URETEROSCOPY AND STENT PLACEMENT Bilateral 05/11/2019   Procedure: CYSTOSCOPY WITH RETROGRADE PYELOGRAM,  AND STENT PLACEMENT;  Surgeon: Lucas Mallow, MD;  Location: [facility];  Service: Urology;  Laterality: Bilateral;   CYSTOSCOPY/URETEROSCOPY/HOLMIUM LASER/STENT PLACEMENT Bilateral 05/25/2019   Procedure: CYSTOSCOPY BILATERAL URETEROSCOPY/HOLMIUM LASER/STENT PLACEMENT;  Surgeon: Lucas Mallow, MD;  Location: [facility];  Service: Urology;  Laterality: Bilateral;   LEFT HEART CATHETERIZATION WITH CORONARY ANGIOGRAM N/A 08/27/2011   Procedure: LEFT HEART CATHETERIZATION WITH CORONARY  ANGIOGRAM;  Surgeon: Troy Sine, MD;  Location: [facility] CATH LAB;  Service: Cardiovascular;;For anterior STEMI/cardiac arrest -- 100% mid LAD occlusion   PERCUTANEOUS CORONARY STENT INTERVENTION (PCI-S) N/A 08/27/2011   Procedure: PERCUTANEOUS CORONARY STENT INTERVENTION (PCI-S);  Surgeon: Troy Sine, MD;  Location: [facility] CATH LAB;  Service: Cardiovascular;;  mid LAD PCI --> Promus Element DES 3.5 mm at 24 mm (3.72 mm)   TRANSTHORACIC ECHOCARDIOGRAM  08/2011   EF 40-45%, moderate at K. of mid and distal inferior septum and anterior apical myocardium. Grade 1 diastolic function. -- Followup echocardiogram to reassess his EF was denied by insurance company   TRANSTHORACIC ECHOCARDIOGRAM  07/20/2020     EF 40 to 45% (similar to 2012) stable wall motion normality (severe HK of midapical anterior apical wall-consistent with prior infarct).  GRII DD.  Normal valves. => Stable compared to 08/30/2011.  EF was still 40 to 45%.   Patient Active Problem List   Diagnosis Date Noted   Breast cancer ([place]) 10/03/2021   Osteoarthritis 10/03/2021   Osteoporosis 07/10/2021   Thoracic aortic atherosclerosis (HCC)-seen on CT 05/11/2021   COPD (chronic obstructive pulmonary disease) with emphysema ([place]) noted on CT 05/11/2021   Age-related osteoporosis without current pathological fracture 02/02/2021   Chronic kidney disease, stage 4 (severe) ([place]) 02/02/2021   Insomnia 02/02/2021   Localized, primary osteoarthritis of hand 02/02/2021   Osteopenia 02/02/2021   Restless legs 02/02/2021   Rheumatoid arthritis ([place]) 02/02/2021   Sciatica 02/02/2021   Vitamin D deficiency 02/02/2021   Genetic testing 07/15/2020   Preoperative cardiovascular examination 07/11/2020   Family history of breast cancer    Family history of skin cancer    Malignant neoplasm of upper-outer quadrant of right breast in female, estrogen receptor positive ([place]) 06/15/2020   Pain in left knee 05/28/2019   Ureteral calculus 05/11/2019   Obesity (BMI 30-39.9) 07/18/2013   Essential hypertension    Hyperlipidemia associated with type 2 diabetes mellitus ([place]) 07/16/2013   Glucose intolerance (impaired glucose tolerance) 07/16/2013   Ventricular tachycardia, sustained; Peri-infarct.  No further episodes 08/28/2011   Hypokalemia 08/27/2011   H/O Anterior STEMI:  Proximal LAD  insertion of a 3.5x24 mm Promus element DES (08/2011) 08/27/2011   CAD S/P percutaneous coronary angioplasty 08/16/2011    REFERRING DIAG: M54.16 (ICD-10-CM) - Lumbar radiculopathy  THERAPY DIAG:  Chronic bilateral low back pain, unspecified whether sciatica present  Muscle weakness (generalized)  Unsteadiness on feet  PERTINENT HISTORY: Hx of breast  cancer, HTN  SUBJECTIVE: Pt reports that she has been feeling better since starting PT, adding that she has been feeling a good muscle tightness in her abdominal muscles when doing her home exercises. She reports that she only has 2/10 pain in her Lt upper thigh today. She also reports that she has been able to sleep more on her Lt side.  PAIN:  Are you having pain? Yes NPRS scale: 2/10 Pain location: Lt lateral hip PAIN TYPE: aching, dull, and sharp Pain description: intermittent  Aggravating factors: prolonged sitting >10 minutes, prolonged standing/ walking >10 minutes, laying prone, and bending over Relieving factors: pain cream, heating pad, and laying on side     OBJECTIVE:   *Unless otherwise noted, objective information collected previously* DIAGNOSTIC FINDINGS:  None available currently   PATIENT SURVEYS:  FOTO 32%, predicted 51% in 12 visits   SCREENING FOR RED FLAGS: Bowel or bladder incontinence: No Cauda equina syndrome: No   COGNITION:  Overall cognitive status: Within functional limits for tasks assessed                        SENSATION:          Light touch: Appears intact   MUSCLE LENGTH: Thomas test: Moderate limitation BIL   POSTURE:  Forward head/shoulder posture, increased lumbar lordosis   PALPATION: TTP to BIL Lt>Rt paraspinals/ QL Painful CPA's to T10-L5   LUMBAR AROM   A/PROM AROM  11/22/2021 AROM 12/06/2021  Flexion 75%, painful on ascension 100%, no pain  Extension 25%, painful 50%, no pain  Right lateral flexion 50% 75%  Left lateral flexion 50% minor pain 75% no pain  Right rotation 50% 50%  Left rotation 50% with pain 50% no pain   (Blank rows = not tested)     LE MMT:   MMT Right 11/22/2021 Left 11/22/2021 Right 12/06/2021 Left 12/06/2021  Hip flexion 4/5 3/5 4+/5 4+/5p!  Hip extension 3/5 3/5 4+/5 4+/5  Hip abduction 4/5 3/5 5/5 4+/5   (Blank rows = not tested)   LUMBAR SPECIAL TESTS:  Slump: (-) BIL SLR: (-)  BIL PLE: (+)  Plank: (+) 4 seconds, terminated due to pain    FUNCTIONAL TESTS:  5xSTS: 23 seconds with use of hands and SPC Squat: 50% with [facility]  12/06/2021: 5xSTS: 13 seconds with use of hands       TODAY'S TREATMENT   [facility] Adult PT Treatment:                                                DATE: 12/06/2021 Therapeutic Exercise: Mini-squat side steps in // bars 2x2 laps Dead lifts with chair behind pt with two 5# kettlebells 3x6 Seated Pallof Press with 10# cable 2x10 with 5-sec hold BIL Seated lumbar rotation stretch 3x10 with 3-sec hold BIL Seated abdominal press-down with 13# cable 3x10 Seated trunk extension over chair back 3x30sec Seated diagonal flexion stretch with reach 2x10 to each foot Standing Cybex hip extension with 17.5# 2x10 BIL Manual Therapy: N/A Neuromuscular re-ed: N/A Therapeutic Activity: N/A Modalities: N/A Self Care: N/A   [facility] Adult PT Treatment:                                                DATE: 12/01/2021 Therapeutic Exercise: Bridge with adduction ball squeeze 3x10 DKTC stretch 3x30 sec Supine hip flexor stretch with leg off side of table x2 min BIL Seated Pallof Press with 10# cable 2x10 with 5-sec hold BIL Seated lumbar rotation stretch 3x10 with 3-sec hold BIL Seated dead lift with 15# kettlebell 3x8 Seated abdominal press-down with 13# cable 3x10 Seated diagonal flexion stretch with reach 2x10 to each foot Manual Therapy: Effleurage and tapotement to Rt lumbar paraspinals and QL Neuromuscular re-ed: N/A Therapeutic Activity: N/A Modalities: N/A Self Care: N/A   [facility] Adult PT Treatment:                                                DATE: 11/30/2021 Therapeutic Exercise: Supine 90/90 abdominal isometric with handhold resistance  3x30 sec Hooklying lower trunk rotation stretch 2x10 BIL Standing abdominal press-down with 17# cable 3x10 Standing trunk extension 2x10 Seated dead lift with 10# kettlebell 2x10 Seated diagonal  flexion stretch with reach 2x10 to each foot Standing Cybex hip abduction with 12.5# 2x10 BIL Standing Cybex hip extension with 12.5# 2x10 BIL Cybex leg press with 40# 3x8 Manual Therapy: Effleurage and tapotement to Rt lumbar paraspinals and QL Neuromuscular re-ed: N/A Therapeutic Activity: N/A Modalities: N/A Self Care: N/A        PATIENT EDUCATION:  Education details: Pt instructed to continue her HEP daily Person educated: Patient Education method: Explanation, Demonstration, and Handouts Education comprehension: verbalized understanding and returned demonstration     HOME EXERCISE PROGRAM: Access Code: [email or web address] URL: [email or web address] Date: 11/22/2021 Prepared by: Vanessa [place]   Exercises Supine Bridge - 1 x daily - 7 x weekly - 3 sets - 10 reps - 3-sec hold Sidelying Hip Abduction - 1 x daily - 7 x weekly - 2 sets - 10 reps - 3-sec hold Abdominal Isometric Hold - FEET OFF TABLE* - 1 x daily - 7 x weekly - 4 sets - 30-sec hold Modified Thomas Stretch - 1 x daily - 7 x weekly - 2 sets - 1-min hold     ASSESSMENT:   CLINICAL IMPRESSION: Pt responded well to all interventions today, demonstrating good form and no increase in pain with selected exercises. Of note, she was able to perform form kettlebell dead lifts without limitation. Upon re-assessment of lumbar AROM, hip, strength, and 5xSTS, the pt has made excellent progress in each, as well as her baseline pain levels. This is indicated in the objective portion of today's note. She will continue to benefit from skilled PT to address her primary impairments and return to her previous level of function with less limitation.   REHAB POTENTIAL: Good   CLINICAL DECISION MAKING: Stable/uncomplicated   EVALUATION COMPLEXITY: Low     GOALS: Goals reviewed with patient? Yes   SHORT TERM GOALS:   STG Name Target Date Goal status  1 Pt will report understanding and adherence to her HEP in order  to promote independence in the management of her primary impairments. Baseline: HEP provided at eval 12/06/2021: Pt reports daily adherence to her HEP 12/20/2021 ACHIEVED    LONG TERM GOALS:    LTG Name Target Date Goal status  1 Pt will achieve global lumbar AROM 80% of normal limits with 0-3/10 pain in order to get dressed with less limitation. Baseline: See AROM chart 12/06/2021: Partially achieved, see updated AROM chart 01/17/2022 PROGRESSING  2 Pt will achieve a FOTO score of 51% in order to demonstrate improved functional ability as it relates to her concordant pain. Baseline: 32% 01/17/2022 INITIAL  3 Pt will achieve global BIL hip strength of 4+/5 or greater in order to return to working out at [employer] with less limitation. Baseline: See MMT Chart 12/06/2021: Achieved 01/17/2022 ACHIEVED  4 Pt will report ability to stand/ walk >30 minutes with 0-3/10 pain in order to grocery shop with less limitation. Baseline: >5/10 pain after 10 minutes of standing/ walking 01/17/2022 INITIAL  5 Pt will report ability to sit >30 minutes with 0-3/10 pain in order to drive with less limitation. Baseline: >5/10 pain after 10 minutes of sitting. 01/17/2022 INITIAL    PLAN: PT FREQUENCY: 2x/week   PT DURATION: 8 weeks   PLANNED INTERVENTIONS: Therapeutic exercises, Therapeutic activity, Neuro Muscular re-education, Balance training, Gait training, Patient/Family education, Joint mobilization, Dry  Needling, Electrical stimulation, Spinal mobilization, Cryotherapy, Taping, Traction, and Manual therapy   PLAN FOR NEXT SESSION: Progress early core/ hip strengthening and mobility    Vanessa [place], PT, DPT 12/06/21 12:12 PM

## 2021-12-07 DIAGNOSIS — G4700 Insomnia, unspecified: Secondary | ICD-10-CM | POA: Diagnosis not present

## 2021-12-07 DIAGNOSIS — C50911 Malignant neoplasm of unspecified site of right female breast: Secondary | ICD-10-CM | POA: Diagnosis not present

## 2021-12-07 DIAGNOSIS — Z0000 Encounter for general adult medical examination without abnormal findings: Secondary | ICD-10-CM | POA: Diagnosis not present

## 2021-12-07 DIAGNOSIS — G2581 Restless legs syndrome: Secondary | ICD-10-CM | POA: Diagnosis not present

## 2021-12-07 DIAGNOSIS — E782 Mixed hyperlipidemia: Secondary | ICD-10-CM | POA: Diagnosis not present

## 2021-12-07 DIAGNOSIS — N184 Chronic kidney disease, stage 4 (severe): Secondary | ICD-10-CM | POA: Diagnosis not present

## 2021-12-07 DIAGNOSIS — I2510 Atherosclerotic heart disease of native coronary artery without angina pectoris: Secondary | ICD-10-CM | POA: Diagnosis not present

## 2021-12-07 DIAGNOSIS — M5416 Radiculopathy, lumbar region: Secondary | ICD-10-CM | POA: Diagnosis not present

## 2021-12-07 DIAGNOSIS — E559 Vitamin D deficiency, unspecified: Secondary | ICD-10-CM | POA: Diagnosis not present

## 2021-12-07 DIAGNOSIS — Z1389 Encounter for screening for other disorder: Secondary | ICD-10-CM | POA: Diagnosis not present

## 2021-12-07 DIAGNOSIS — M810 Age-related osteoporosis without current pathological fracture: Secondary | ICD-10-CM | POA: Diagnosis not present

## 2021-12-07 DIAGNOSIS — M069 Rheumatoid arthritis, unspecified: Secondary | ICD-10-CM | POA: Diagnosis not present

## 2021-12-07 NOTE — Therapy (Incomplete)
OUTPATIENT PHYSICAL THERAPY TREATMENT NOTE   Patient Name: Samantha Mcbride MRN: [id number] DOB:1951/03/16, 71 y.o., female Today's Date: 12/06/2021  PCP: Antony Contras, MD REFERRING PROVIDER: Eustace Moore, MD   PT End of Session - 12/06/21 1123     Visit Number 4    Number of Visits 17    Date for PT Re-Evaluation 01/17/22    Authorization Type [employer] MCR    Authorization Time Period FOTO v6,v10; KX mod at v15    Progress Note Due on Visit 10    PT Start Time 1130    PT Stop Time 1215    PT Time Calculation (min) 45 min    Activity Tolerance Patient tolerated treatment well    Behavior During Therapy [facility] for tasks assessed/performed               Past Medical History:  Diagnosis Date   Anemia    iron deficiency anemia    CAD S/P percutaneous coronary angioplasty 08/2011   Promus DES - mid LAD 3.5 mm x 24 mm (3.72 mm)    Cancer (HCC)    Chronic kidney disease    stage 4 kidney disease per pt dx 04/6194   Complication of anesthesia    Dyslipidemia, goal LDL below 70     on statin, close to goal   Family history of breast cancer    Family history of skin cancer    Former moderate cigarette smoker (10-19 per day)    Glucose intolerance (impaired glucose tolerance)    H/O thyroid nodule    Benign   History 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 --> Promus DES =>  EF 40 to 45% (similar to 2012) stable wall motion normality (severe HK of midapical anterior apical wall-consistent with prior infarct).  GRII DD.  Normal valves. => Stable compared to 08/30/2011.  EF was still 40 to 45%.   Hypertension    PONV (postoperative nausea and vomiting)    no issues with surgery on 05-11-2019   Rheumatoid arthritis ([place])    managed on humira    SOB (shortness of breath) on exertion    reports "its been that way since my heart attack " repots no recurrence of MI sx since that time    Past Surgical  History:  Procedure Laterality Date   ABDOMINAL AORTAGRAM N/A 08/27/2011   Procedure: ABDOMINAL Maxcine Ham;  Surgeon: Troy Sine, MD;  Location: [facility] C[facility] LAB;  Service: Cardiovascular;  Laterality: N/A;   ANKLE SURGERY Right 1995   per pt ankle surgery here at [place] EXCISIONAL BIOPSY Left    BREAST LUMPECTOMY WITH RADIOACTIVE SEED AND SENTINEL LYMPH NODE BIOPSY Right 08/05/2020   Procedure: RIGHT BREAST LUMPECTOMY WITH RADIOACTIVE SEED AND SENTINEL LYMPH NODE BIOPSY;  Surgeon: [name], MD;  Location: [place];  Service: General;  Laterality: Right;   CYSTOSCOPY WITH RETROGRADE PYELOGRAM, URETEROSCOPY AND STENT PLACEMENT Bilateral 05/11/2019   Procedure: CYSTOSCOPY WITH RETROGRADE PYELOGRAM,  AND STENT PLACEMENT;  Surgeon: Lucas Mallow, MD;  Location: [facility];  Service: Urology;  Laterality: Bilateral;   CYSTOSCOPY/URETEROSCOPY/HOLMIUM LASER/STENT PLACEMENT Bilateral 05/25/2019   Procedure: CYSTOSCOPY BILATERAL URETEROSCOPY/HOLMIUM LASER/STENT PLACEMENT;  Surgeon: Lucas Mallow, MD;  Location: [facility];  Service: Urology;  Laterality: Bilateral;   LEFT HEART CATHETERIZATION WITH CORONARY ANGIOGRAM N/A 08/27/2011   Procedure: LEFT HEART CATHETERIZATION WITH CORONARY  ANGIOGRAM;  Surgeon: Troy Sine, MD;  Location: [facility] CATH LAB;  Service: Cardiovascular;;For anterior STEMI/cardiac arrest -- 100% mid LAD occlusion   PERCUTANEOUS CORONARY STENT INTERVENTION (PCI-S) N/A 08/27/2011   Procedure: PERCUTANEOUS CORONARY STENT INTERVENTION (PCI-S);  Surgeon: Troy Sine, MD;  Location: [facility] CATH LAB;  Service: Cardiovascular;;  mid LAD PCI --> Promus Element DES 3.5 mm at 24 mm (3.72 mm)   TRANSTHORACIC ECHOCARDIOGRAM  08/2011   EF 40-45%, moderate at K. of mid and distal inferior septum and anterior apical myocardium. Grade 1 diastolic function. -- Followup echocardiogram to reassess his EF was denied by insurance company   TRANSTHORACIC ECHOCARDIOGRAM  07/20/2020     EF 40 to 45% (similar to 2012) stable wall motion normality (severe HK of midapical anterior apical wall-consistent with prior infarct).  GRII DD.  Normal valves. => Stable compared to 08/30/2011.  EF was still 40 to 45%.   Patient Active Problem List   Diagnosis Date Noted   Breast cancer ([place]) 10/03/2021   Osteoarthritis 10/03/2021   Osteoporosis 07/10/2021   Thoracic aortic atherosclerosis (HCC)-seen on CT 05/11/2021   COPD (chronic obstructive pulmonary disease) with emphysema ([place]) noted on CT 05/11/2021   Age-related osteoporosis without current pathological fracture 02/02/2021   Chronic kidney disease, stage 4 (severe) ([place]) 02/02/2021   Insomnia 02/02/2021   Localized, primary osteoarthritis of hand 02/02/2021   Osteopenia 02/02/2021   Restless legs 02/02/2021   Rheumatoid arthritis ([place]) 02/02/2021   Sciatica 02/02/2021   Vitamin D deficiency 02/02/2021   Genetic testing 07/15/2020   Preoperative cardiovascular examination 07/11/2020   Family history of breast cancer    Family history of skin cancer    Malignant neoplasm of upper-outer quadrant of right breast in female, estrogen receptor positive ([place]) 06/15/2020   Pain in left knee 05/28/2019   Ureteral calculus 05/11/2019   Obesity (BMI 30-39.9) 07/18/2013   Essential hypertension    Hyperlipidemia associated with type 2 diabetes mellitus ([place]) 07/16/2013   Glucose intolerance (impaired glucose tolerance) 07/16/2013   Ventricular tachycardia, sustained; Peri-infarct.  No further episodes 08/28/2011   Hypokalemia 08/27/2011   H/O Anterior STEMI:  Proximal LAD  insertion of a 3.5x24 mm Promus element DES (08/2011) 08/27/2011   CAD S/P percutaneous coronary angioplasty 08/16/2011    REFERRING DIAG: M54.16 (ICD-10-CM) - Lumbar radiculopathy  THERAPY DIAG:  Chronic bilateral low back pain, unspecified whether sciatica present  Muscle weakness (generalized)  Unsteadiness on feet  PERTINENT HISTORY: Hx of breast  cancer, HTN  SUBJECTIVE: ***  PAIN:  Are you having pain? Yes NPRS scale: 2/10 Pain location: Lt lateral hip PAIN TYPE: aching, dull, and sharp Pain description: intermittent  Aggravating factors: prolonged sitting >10 minutes, prolonged standing/ walking >10 minutes, laying prone, and bending over Relieving factors: pain cream, heating pad, and laying on side     OBJECTIVE:   *Unless otherwise noted, objective information collected previously* DIAGNOSTIC FINDINGS:  None available currently   PATIENT SURVEYS:  FOTO 32%, predicted 51% in 12 visits   SCREENING FOR RED FLAGS: Bowel or bladder incontinence: No Cauda equina syndrome: No   COGNITION:          Overall cognitive status: Within functional limits for tasks assessed                        SENSATION:          Light touch: Appears intact   MUSCLE LENGTH: Thomas test: Moderate limitation BIL  POSTURE:  Forward head/shoulder posture, increased lumbar lordosis   PALPATION: TTP to BIL Lt>Rt paraspinals/ QL Painful CPA's to T10-L5   LUMBAR AROM   A/PROM AROM  11/22/2021 AROM 12/06/2021  Flexion 75%, painful on ascension 100%, no pain  Extension 25%, painful 50%, no pain  Right lateral flexion 50% 75%  Left lateral flexion 50% minor pain 75% no pain  Right rotation 50% 50%  Left rotation 50% with pain 50% no pain   (Blank rows = not tested)     LE MMT:   MMT Right 11/22/2021 Left 11/22/2021 Right 12/06/2021 Left 12/06/2021  Hip flexion 4/5 3/5 4+/5 4+/5p!  Hip extension 3/5 3/5 4+/5 4+/5  Hip abduction 4/5 3/5 5/5 4+/5   (Blank rows = not tested)   LUMBAR SPECIAL TESTS:  Slump: (-) BIL SLR: (-) BIL PLE: (+)  Plank: (+) 4 seconds, terminated due to pain    FUNCTIONAL TESTS:  5xSTS: 23 seconds with use of hands and SPC Squat: 50% with [facility]  12/06/2021: 5xSTS: 13 seconds with use of hands       TODAY'S TREATMENT   [facility] Adult PT Treatment:                                                DATE:  12/08/2021 Therapeutic Exercise: *** Manual Therapy: *** Neuromuscular re-ed: *** Therapeutic Activity: *** Modalities: *** Self Care: ***   [facility] Adult PT Treatment:                                                DATE: 12/06/2021 Therapeutic Exercise: Mini-squat side steps in // bars 2x2 laps Dead lifts with chair behind pt with two 5# kettlebells 3x6 Seated Pallof Press with 10# cable 2x10 with 5-sec hold BIL Seated lumbar rotation stretch 3x10 with 3-sec hold BIL Seated abdominal press-down with 13# cable 3x10 Seated trunk extension over chair back 3x30sec Seated diagonal flexion stretch with reach 2x10 to each foot Standing Cybex hip extension with 17.5# 2x10 BIL Manual Therapy: N/A Neuromuscular re-ed: N/A Therapeutic Activity: N/A Modalities: N/A Self Care: N/A   [facility] Adult PT Treatment:                                                DATE: 12/01/2021 Therapeutic Exercise: Bridge with adduction ball squeeze 3x10 DKTC stretch 3x30 sec Supine hip flexor stretch with leg off side of table x2 min BIL Seated Pallof Press with 10# cable 2x10 with 5-sec hold BIL Seated lumbar rotation stretch 3x10 with 3-sec hold BIL Seated dead lift with 15# kettlebell 3x8 Seated abdominal press-down with 13# cable 3x10 Seated diagonal flexion stretch with reach 2x10 to each foot Manual Therapy: Effleurage and tapotement to Rt lumbar paraspinals and QL Neuromuscular re-ed: N/A Therapeutic Activity: N/A Modalities: N/A Self Care: N/A        PATIENT EDUCATION:  Education details: Pt instructed to continue her HEP daily Person educated: Patient Education method: Explanation, Demonstration, and Handouts Education comprehension: verbalized understanding and returned demonstration     HOME EXERCISE PROGRAM: Access Code: [email or web address] URL: [email or web address] Date: 11/22/2021  Prepared by: Vanessa Pray   Exercises Supine Bridge - 1 x daily - 7 x weekly - 3  sets - 10 reps - 3-sec hold Sidelying Hip Abduction - 1 x daily - 7 x weekly - 2 sets - 10 reps - 3-sec hold Abdominal Isometric Hold - FEET OFF TABLE* - 1 x daily - 7 x weekly - 4 sets - 30-sec hold Modified Thomas Stretch - 1 x daily - 7 x weekly - 2 sets - 1-min hold     ASSESSMENT:   CLINICAL IMPRESSION: ***   REHAB POTENTIAL: Good   CLINICAL DECISION MAKING: Stable/uncomplicated   EVALUATION COMPLEXITY: Low     GOALS: Goals reviewed with patient? Yes   SHORT TERM GOALS:   STG Name Target Date Goal status  1 Pt will report understanding and adherence to her HEP in order to promote independence in the management of her primary impairments. Baseline: HEP provided at eval 12/06/2021: Pt reports daily adherence to her HEP 12/20/2021 ACHIEVED    LONG TERM GOALS:    LTG Name Target Date Goal status  1 Pt will achieve global lumbar AROM 80% of normal limits with 0-3/10 pain in order to get dressed with less limitation. Baseline: See AROM chart 12/06/2021: Partially achieved, see updated AROM chart 01/17/2022 PROGRESSING  2 Pt will achieve a FOTO score of 51% in order to demonstrate improved functional ability as it relates to her concordant pain. Baseline: 32% 01/17/2022 INITIAL  3 Pt will achieve global BIL hip strength of 4+/5 or greater in order to return to working out at [employer] with less limitation. Baseline: See MMT Chart 12/06/2021: Achieved 01/17/2022 ACHIEVED  4 Pt will report ability to stand/ walk >30 minutes with 0-3/10 pain in order to grocery shop with less limitation. Baseline: >5/10 pain after 10 minutes of standing/ walking 01/17/2022 INITIAL  5 Pt will report ability to sit >30 minutes with 0-3/10 pain in order to drive with less limitation. Baseline: >5/10 pain after 10 minutes of sitting. 01/17/2022 INITIAL    PLAN: PT FREQUENCY: 2x/week   PT DURATION: 8 weeks   PLANNED INTERVENTIONS: Therapeutic exercises, Therapeutic activity, Neuro Muscular re-education,  Balance training, Gait training, Patient/Family education, Joint mobilization, Dry Needling, Electrical stimulation, Spinal mobilization, Cryotherapy, Taping, Traction, and Manual therapy   PLAN FOR NEXT SESSION: Progress early core/ hip strengthening and mobility    Vanessa [place], PT, DPT 12/07/21 9:11 AM

## 2021-12-08 ENCOUNTER — Ambulatory Visit: Payer: Medicare Other

## 2021-12-12 NOTE — Therapy (Signed)
OUTPATIENT PHYSICAL THERAPY TREATMENT NOTE   Patient Name: Samantha Mcbride MRN: [id number] DOB:1951/03/16, 71 y.o., female Today's Date: 12/06/2021  PCP: Antony Contras, MD REFERRING PROVIDER: Eustace Moore, MD   PT End of Session - 12/06/21 1123     Visit Number 4    Number of Visits 17    Date for PT Re-Evaluation 01/17/22    Authorization Type [employer] MCR    Authorization Time Period FOTO v6,v10; KX mod at v15    Progress Note Due on Visit 10    PT Start Time 1130    PT Stop Time 1215    PT Time Calculation (min) 45 min    Activity Tolerance Patient tolerated treatment well    Behavior During Therapy [facility] for tasks assessed/performed               Past Medical History:  Diagnosis Date   Anemia    iron deficiency anemia    CAD S/P percutaneous coronary angioplasty 08/2011   Promus DES - mid LAD 3.5 mm x 24 mm (3.72 mm)    Cancer (HCC)    Chronic kidney disease    stage 4 kidney disease per pt dx 04/6194   Complication of anesthesia    Dyslipidemia, goal LDL below 70     on statin, close to goal   Family history of breast cancer    Family history of skin cancer    Former moderate cigarette smoker (10-19 per day)    Glucose intolerance (impaired glucose tolerance)    H/O thyroid nodule    Benign   History 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 --> Promus DES =>  EF 40 to 45% (similar to 2012) stable wall motion normality (severe HK of midapical anterior apical wall-consistent with prior infarct).  GRII DD.  Normal valves. => Stable compared to 08/30/2011.  EF was still 40 to 45%.   Hypertension    PONV (postoperative nausea and vomiting)    no issues with surgery on 05-11-2019   Rheumatoid arthritis ([place])    managed on humira    SOB (shortness of breath) on exertion    reports "its been that way since my heart attack " repots no recurrence of MI sx since that time    Past Surgical  History:  Procedure Laterality Date   ABDOMINAL AORTAGRAM N/A 08/27/2011   Procedure: ABDOMINAL Maxcine Ham;  Surgeon: Troy Sine, MD;  Location: [facility] C[facility] LAB;  Service: Cardiovascular;  Laterality: N/A;   ANKLE SURGERY Right 1995   per pt ankle surgery here at [place] EXCISIONAL BIOPSY Left    BREAST LUMPECTOMY WITH RADIOACTIVE SEED AND SENTINEL LYMPH NODE BIOPSY Right 08/05/2020   Procedure: RIGHT BREAST LUMPECTOMY WITH RADIOACTIVE SEED AND SENTINEL LYMPH NODE BIOPSY;  Surgeon: [name], MD;  Location: [place];  Service: General;  Laterality: Right;   CYSTOSCOPY WITH RETROGRADE PYELOGRAM, URETEROSCOPY AND STENT PLACEMENT Bilateral 05/11/2019   Procedure: CYSTOSCOPY WITH RETROGRADE PYELOGRAM,  AND STENT PLACEMENT;  Surgeon: Lucas Mallow, MD;  Location: [facility];  Service: Urology;  Laterality: Bilateral;   CYSTOSCOPY/URETEROSCOPY/HOLMIUM LASER/STENT PLACEMENT Bilateral 05/25/2019   Procedure: CYSTOSCOPY BILATERAL URETEROSCOPY/HOLMIUM LASER/STENT PLACEMENT;  Surgeon: Lucas Mallow, MD;  Location: [facility];  Service: Urology;  Laterality: Bilateral;   LEFT HEART CATHETERIZATION WITH CORONARY ANGIOGRAM N/A 08/27/2011   Procedure: LEFT HEART CATHETERIZATION WITH CORONARY  ANGIOGRAM;  Surgeon: Troy Sine, MD;  Location: [facility] CATH LAB;  Service: Cardiovascular;;For anterior STEMI/cardiac arrest -- 100% mid LAD occlusion   PERCUTANEOUS CORONARY STENT INTERVENTION (PCI-S) N/A 08/27/2011   Procedure: PERCUTANEOUS CORONARY STENT INTERVENTION (PCI-S);  Surgeon: Troy Sine, MD;  Location: [facility] CATH LAB;  Service: Cardiovascular;;  mid LAD PCI --> Promus Element DES 3.5 mm at 24 mm (3.72 mm)   TRANSTHORACIC ECHOCARDIOGRAM  08/2011   EF 40-45%, moderate at K. of mid and distal inferior septum and anterior apical myocardium. Grade 1 diastolic function. -- Followup echocardiogram to reassess his EF was denied by insurance company   TRANSTHORACIC ECHOCARDIOGRAM  07/20/2020     EF 40 to 45% (similar to 2012) stable wall motion normality (severe HK of midapical anterior apical wall-consistent with prior infarct).  GRII DD.  Normal valves. => Stable compared to 08/30/2011.  EF was still 40 to 45%.   Patient Active Problem List   Diagnosis Date Noted   Breast cancer ([place]) 10/03/2021   Osteoarthritis 10/03/2021   Osteoporosis 07/10/2021   Thoracic aortic atherosclerosis (HCC)-seen on CT 05/11/2021   COPD (chronic obstructive pulmonary disease) with emphysema ([place]) noted on CT 05/11/2021   Age-related osteoporosis without current pathological fracture 02/02/2021   Chronic kidney disease, stage 4 (severe) ([place]) 02/02/2021   Insomnia 02/02/2021   Localized, primary osteoarthritis of hand 02/02/2021   Osteopenia 02/02/2021   Restless legs 02/02/2021   Rheumatoid arthritis ([place]) 02/02/2021   Sciatica 02/02/2021   Vitamin D deficiency 02/02/2021   Genetic testing 07/15/2020   Preoperative cardiovascular examination 07/11/2020   Family history of breast cancer    Family history of skin cancer    Malignant neoplasm of upper-outer quadrant of right breast in female, estrogen receptor positive ([place]) 06/15/2020   Pain in left knee 05/28/2019   Ureteral calculus 05/11/2019   Obesity (BMI 30-39.9) 07/18/2013   Essential hypertension    Hyperlipidemia associated with type 2 diabetes mellitus ([place]) 07/16/2013   Glucose intolerance (impaired glucose tolerance) 07/16/2013   Ventricular tachycardia, sustained; Peri-infarct.  No further episodes 08/28/2011   Hypokalemia 08/27/2011   H/O Anterior STEMI:  Proximal LAD  insertion of a 3.5x24 mm Promus element DES (08/2011) 08/27/2011   CAD S/P percutaneous coronary angioplasty 08/16/2011    REFERRING DIAG: M54.16 (ICD-10-CM) - Lumbar radiculopathy  THERAPY DIAG:  Chronic bilateral low back pain, unspecified whether sciatica present  Muscle weakness (generalized)  Unsteadiness on feet  PERTINENT HISTORY: Hx of breast  cancer, HTN  SUBJECTIVE: Pt reports that she is feeling pretty good today, although she continues to have 2/10 Lt lateral hip pain. She reports not being able to do her HEP the past 2 weeks due to being sick and having work done on her house. She adds that she has to leave about 10 minutes early today.  PAIN:  Are you having pain? Yes NPRS scale: 2/10 Pain location: Lt lateral hip PAIN TYPE: aching, dull, and sharp Pain description: intermittent  Aggravating factors: prolonged sitting >10 minutes, prolonged standing/ walking >10 minutes, laying prone, and bending over Relieving factors: pain cream, heating pad, and laying on side     OBJECTIVE:   *Unless otherwise noted, objective information collected previously* DIAGNOSTIC FINDINGS:  None available currently   PATIENT SURVEYS:  FOTO 32%, predicted 51% in 12 visits   SCREENING FOR RED FLAGS: Bowel or bladder incontinence: No Cauda equina syndrome: No   COGNITION:          Overall cognitive status:  Within functional limits for tasks assessed                        SENSATION:          Light touch: Appears intact   MUSCLE LENGTH: Thomas test: Moderate limitation BIL   POSTURE:  Forward head/shoulder posture, increased lumbar lordosis   PALPATION: TTP to BIL Lt>Rt paraspinals/ QL Painful CPA's to T10-L5   LUMBAR AROM   A/PROM AROM  11/22/2021 AROM 12/06/2021  Flexion 75%, painful on ascension 100%, no pain  Extension 25%, painful 50%, no pain  Right lateral flexion 50% 75%  Left lateral flexion 50% minor pain 75% no pain  Right rotation 50% 50%  Left rotation 50% with pain 50% no pain   (Blank rows = not tested)     LE MMT:   MMT Right 11/22/2021 Left 11/22/2021 Right 12/06/2021 Left 12/06/2021  Hip flexion 4/5 3/5 4+/5 4+/5p!  Hip extension 3/5 3/5 4+/5 4+/5  Hip abduction 4/5 3/5 5/5 4+/5   (Blank rows = not tested)   LUMBAR SPECIAL TESTS:  Slump: (-) BIL SLR: (-) BIL PLE: (+)  Plank: (+) 4 seconds,  terminated due to pain    FUNCTIONAL TESTS:  5xSTS: 23 seconds with use of hands and SPC Squat: 50% with [facility]  12/06/2021: 5xSTS: 13 seconds with use of hands       TODAY'S TREATMENT   [facility] Adult PT Treatment:                                                DATE: 2/29/2023 Therapeutic Exercise: Prone alternating hip extension 2x10 with 3-sec hold BIL Supine DKTC stretch 3x30sec Standing 15# kettlebell dead lift with table behind patient 3x8 Supine 90/90 abdominal crunches with straight arms holding YTB resistance 3x15 Standing Cybex hip extension with 25# 2x10 BIL Standing Cybex hip flexion with 25# 2x10 BIL Standing Cybex hip abduction with 25# 2x10 BIL Manual Therapy: N/A Neuromuscular re-ed: N/A Therapeutic Activity: N/A Modalities: N/A Self Care: N/A   [facility] Adult PT Treatment:                                                DATE: 12/06/2021 Therapeutic Exercise: Mini-squat side steps in // bars 2x2 laps Dead lifts with chair behind pt with two 5# kettlebells 3x6 Seated Pallof Press with 10# cable 2x10 with 5-sec hold BIL Seated lumbar rotation stretch 3x10 with 3-sec hold BIL Seated abdominal press-down with 13# cable 3x10 Seated trunk extension over chair back 3x30sec Seated diagonal flexion stretch with reach 2x10 to each foot Standing Cybex hip extension with 17.5# 2x10 BIL Manual Therapy: N/A Neuromuscular re-ed: N/A Therapeutic Activity: N/A Modalities: N/A Self Care: N/A   [facility] Adult PT Treatment:                                                DATE: 12/01/2021 Therapeutic Exercise: Bridge with adduction ball squeeze 3x10 DKTC stretch 3x30 sec Supine hip flexor stretch with leg off side of table x2 min BIL Seated Pallof Press with  10# cable 2x10 with 5-sec hold BIL Seated lumbar rotation stretch 3x10 with 3-sec hold BIL Seated dead lift with 15# kettlebell 3x8 Seated abdominal press-down with 13# cable 3x10 Seated diagonal flexion stretch with reach  2x10 to each foot Manual Therapy: Effleurage and tapotement to Rt lumbar paraspinals and QL Neuromuscular re-ed: N/A Therapeutic Activity: N/A Modalities: N/A Self Care: N/A        PATIENT EDUCATION:  Education details: Pt instructed to continue her HEP daily Person educated: Patient Education method: Explanation, Demonstration, and Handouts Education comprehension: verbalized understanding and returned demonstration     HOME EXERCISE PROGRAM: Access Code: [email or web address] URL: [email or web address] Date: 11/22/2021 Prepared by: Vanessa Washburn   Exercises Supine Bridge - 1 x daily - 7 x weekly - 3 sets - 10 reps - 3-sec hold Sidelying Hip Abduction - 1 x daily - 7 x weekly - 2 sets - 10 reps - 3-sec hold Abdominal Isometric Hold - FEET OFF TABLE* - 1 x daily - 7 x weekly - 4 sets - 30-sec hold Modified Thomas Stretch - 1 x daily - 7 x weekly - 2 sets - 1-min hold     ASSESSMENT:   CLINICAL IMPRESSION: Pt responded well to all interventions today. She shows improved functional lifting ability as she was able to complete form dead lifts with 15#, a feat she was unable to perform initially in PT. Due to the pt having to leave 10 minutes early, the session was ended early today. She will continue to benefit from skilled PT to address her primary impairments and return to her prior level of function with less limitation.   REHAB POTENTIAL: Good   CLINICAL DECISION MAKING: Stable/uncomplicated   EVALUATION COMPLEXITY: Low     GOALS: Goals reviewed with patient? Yes   SHORT TERM GOALS:   STG Name Target Date Goal status  1 Pt will report understanding and adherence to her HEP in order to promote independence in the management of her primary impairments. Baseline: HEP provided at eval 12/06/2021: Pt reports daily adherence to her HEP 12/20/2021 ACHIEVED    LONG TERM GOALS:    LTG Name Target Date Goal status  1 Pt will achieve global lumbar AROM 80% of normal  limits with 0-3/10 pain in order to get dressed with less limitation. Baseline: See AROM chart 12/06/2021: Partially achieved, see updated AROM chart 01/17/2022 PROGRESSING  2 Pt will achieve a FOTO score of 51% in order to demonstrate improved functional ability as it relates to her concordant pain. Baseline: 32% 01/17/2022 INITIAL  3 Pt will achieve global BIL hip strength of 4+/5 or greater in order to return to working out at [employer] with less limitation. Baseline: See MMT Chart 12/06/2021: Achieved 01/17/2022 ACHIEVED  4 Pt will report ability to stand/ walk >30 minutes with 0-3/10 pain in order to grocery shop with less limitation. Baseline: >5/10 pain after 10 minutes of standing/ walking 01/17/2022 INITIAL  5 Pt will report ability to sit >30 minutes with 0-3/10 pain in order to drive with less limitation. Baseline: >5/10 pain after 10 minutes of sitting. 01/17/2022 INITIAL    PLAN: PT FREQUENCY: 2x/week   PT DURATION: 8 weeks   PLANNED INTERVENTIONS: Therapeutic exercises, Therapeutic activity, Neuro Muscular re-education, Balance training, Gait training, Patient/Family education, Joint mobilization, Dry Needling, Electrical stimulation, Spinal mobilization, Cryotherapy, Taping, Traction, and Manual therapy   PLAN FOR NEXT SESSION: Progress early core/ hip strengthening and mobility    Vanessa Woodbine, PT, DPT 12/13/21 12:05 PM

## 2021-12-13 ENCOUNTER — Other Ambulatory Visit: Payer: Self-pay

## 2021-12-13 ENCOUNTER — Ambulatory Visit: Payer: Medicare Other | Attending: General Surgery

## 2021-12-13 DIAGNOSIS — M6281 Muscle weakness (generalized): Secondary | ICD-10-CM | POA: Diagnosis not present

## 2021-12-13 DIAGNOSIS — R2681 Unsteadiness on feet: Secondary | ICD-10-CM | POA: Insufficient documentation

## 2021-12-13 DIAGNOSIS — M545 Low back pain, unspecified: Secondary | ICD-10-CM | POA: Insufficient documentation

## 2021-12-13 DIAGNOSIS — G8929 Other chronic pain: Secondary | ICD-10-CM | POA: Diagnosis not present

## 2021-12-14 NOTE — Therapy (Signed)
OUTPATIENT PHYSICAL THERAPY TREATMENT NOTE   Patient Name: Siera Beyersdorf MRN: [id number] DOB:1951/03/16, 71 y.o., female Today's Date: 12/06/2021  PCP: Antony Contras, MD REFERRING PROVIDER: Eustace Moore, MD   PT End of Session - 12/06/21 1123     Visit Number 4    Number of Visits 17    Date for PT Re-Evaluation 01/17/22    Authorization Type [employer] MCR    Authorization Time Period FOTO v6,v10; KX mod at v15    Progress Note Due on Visit 10    PT Start Time 1130    PT Stop Time 1215    PT Time Calculation (min) 45 min    Activity Tolerance Patient tolerated treatment well    Behavior During Therapy [facility] for tasks assessed/performed               Past Medical History:  Diagnosis Date   Anemia    iron deficiency anemia    CAD S/P percutaneous coronary angioplasty 08/2011   Promus DES - mid LAD 3.5 mm x 24 mm (3.72 mm)    Cancer (HCC)    Chronic kidney disease    stage 4 kidney disease per pt dx 04/6194   Complication of anesthesia    Dyslipidemia, goal LDL below 70     on statin, close to goal   Family history of breast cancer    Family history of skin cancer    Former moderate cigarette smoker (10-19 per day)    Glucose intolerance (impaired glucose tolerance)    H/O thyroid nodule    Benign   History 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 --> Promus DES =>  EF 40 to 45% (similar to 2012) stable wall motion normality (severe HK of midapical anterior apical wall-consistent with prior infarct).  GRII DD.  Normal valves. => Stable compared to 08/30/2011.  EF was still 40 to 45%.   Hypertension    PONV (postoperative nausea and vomiting)    no issues with surgery on 05-11-2019   Rheumatoid arthritis ([place])    managed on humira    SOB (shortness of breath) on exertion    reports "its been that way since my heart attack " repots no recurrence of MI sx since that time    Past Surgical  History:  Procedure Laterality Date   ABDOMINAL AORTAGRAM N/A 08/27/2011   Procedure: ABDOMINAL Maxcine Ham;  Surgeon: Troy Sine, MD;  Location: [facility] C[facility] LAB;  Service: Cardiovascular;  Laterality: N/A;   ANKLE SURGERY Right 1995   per pt ankle surgery here at [place] EXCISIONAL BIOPSY Left    BREAST LUMPECTOMY WITH RADIOACTIVE SEED AND SENTINEL LYMPH NODE BIOPSY Right 08/05/2020   Procedure: RIGHT BREAST LUMPECTOMY WITH RADIOACTIVE SEED AND SENTINEL LYMPH NODE BIOPSY;  Surgeon: [name], MD;  Location: [place];  Service: General;  Laterality: Right;   CYSTOSCOPY WITH RETROGRADE PYELOGRAM, URETEROSCOPY AND STENT PLACEMENT Bilateral 05/11/2019   Procedure: CYSTOSCOPY WITH RETROGRADE PYELOGRAM,  AND STENT PLACEMENT;  Surgeon: Lucas Mallow, MD;  Location: [facility];  Service: Urology;  Laterality: Bilateral;   CYSTOSCOPY/URETEROSCOPY/HOLMIUM LASER/STENT PLACEMENT Bilateral 05/25/2019   Procedure: CYSTOSCOPY BILATERAL URETEROSCOPY/HOLMIUM LASER/STENT PLACEMENT;  Surgeon: Lucas Mallow, MD;  Location: [facility];  Service: Urology;  Laterality: Bilateral;   LEFT HEART CATHETERIZATION WITH CORONARY ANGIOGRAM N/A 08/27/2011   Procedure: LEFT HEART CATHETERIZATION WITH CORONARY  ANGIOGRAM;  Surgeon: Troy Sine, MD;  Location: [facility] CATH LAB;  Service: Cardiovascular;;For anterior STEMI/cardiac arrest -- 100% mid LAD occlusion   PERCUTANEOUS CORONARY STENT INTERVENTION (PCI-S) N/A 08/27/2011   Procedure: PERCUTANEOUS CORONARY STENT INTERVENTION (PCI-S);  Surgeon: Troy Sine, MD;  Location: [facility] CATH LAB;  Service: Cardiovascular;;  mid LAD PCI --> Promus Element DES 3.5 mm at 24 mm (3.72 mm)   TRANSTHORACIC ECHOCARDIOGRAM  08/2011   EF 40-45%, moderate at K. of mid and distal inferior septum and anterior apical myocardium. Grade 1 diastolic function. -- Followup echocardiogram to reassess his EF was denied by insurance company   TRANSTHORACIC ECHOCARDIOGRAM  07/20/2020     EF 40 to 45% (similar to 2012) stable wall motion normality (severe HK of midapical anterior apical wall-consistent with prior infarct).  GRII DD.  Normal valves. => Stable compared to 08/30/2011.  EF was still 40 to 45%.   Patient Active Problem List   Diagnosis Date Noted   Breast cancer ([place]) 10/03/2021   Osteoarthritis 10/03/2021   Osteoporosis 07/10/2021   Thoracic aortic atherosclerosis (HCC)-seen on CT 05/11/2021   COPD (chronic obstructive pulmonary disease) with emphysema ([place]) noted on CT 05/11/2021   Age-related osteoporosis without current pathological fracture 02/02/2021   Chronic kidney disease, stage 4 (severe) ([place]) 02/02/2021   Insomnia 02/02/2021   Localized, primary osteoarthritis of hand 02/02/2021   Osteopenia 02/02/2021   Restless legs 02/02/2021   Rheumatoid arthritis ([place]) 02/02/2021   Sciatica 02/02/2021   Vitamin D deficiency 02/02/2021   Genetic testing 07/15/2020   Preoperative cardiovascular examination 07/11/2020   Family history of breast cancer    Family history of skin cancer    Malignant neoplasm of upper-outer quadrant of right breast in female, estrogen receptor positive ([place]) 06/15/2020   Pain in left knee 05/28/2019   Ureteral calculus 05/11/2019   Obesity (BMI 30-39.9) 07/18/2013   Essential hypertension    Hyperlipidemia associated with type 2 diabetes mellitus ([place]) 07/16/2013   Glucose intolerance (impaired glucose tolerance) 07/16/2013   Ventricular tachycardia, sustained; Peri-infarct.  No further episodes 08/28/2011   Hypokalemia 08/27/2011   H/O Anterior STEMI:  Proximal LAD  insertion of a 3.5x24 mm Promus element DES (08/2011) 08/27/2011   CAD S/P percutaneous coronary angioplasty 08/16/2011    REFERRING DIAG: M54.16 (ICD-10-CM) - Lumbar radiculopathy  THERAPY DIAG:  Chronic bilateral low back pain, unspecified whether sciatica present  Muscle weakness (generalized)  Unsteadiness on feet  PERTINENT HISTORY: Hx of breast  cancer, HTN  SUBJECTIVE: Pt reports 3/10 Lt lateral hip pain and global Lt knee pain today. She adds that she has been doing her HEP every other day.  PAIN:  Are you having pain? Yes NPRS scale: 3/10 Pain location: Lt lateral hip, global knee PAIN TYPE: aching, dull, and sharp Pain description: intermittent  Aggravating factors: prolonged sitting >10 minutes, prolonged standing/ walking >10 minutes, laying prone, and bending over Relieving factors: pain cream, heating pad, and laying on side     OBJECTIVE:   *Unless otherwise noted, objective information collected previously* DIAGNOSTIC FINDINGS:  None available currently   PATIENT SURVEYS:  FOTO 32%, predicted 51% in 12 visits 12/15/2021: 42%   SCREENING FOR RED FLAGS: Bowel or bladder incontinence: No Cauda equina syndrome: No   COGNITION:          Overall cognitive status: Within functional limits for tasks assessed  SENSATION:          Light touch: Appears intact   MUSCLE LENGTH: Thomas test: Moderate limitation BIL   POSTURE:  Forward head/shoulder posture, increased lumbar lordosis   PALPATION: TTP to BIL Lt>Rt paraspinals/ QL Painful CPA's to T10-L5   LUMBAR AROM   A/PROM AROM  11/22/2021 AROM 12/06/2021  Flexion 75%, painful on ascension 100%, no pain  Extension 25%, painful 50%, no pain  Right lateral flexion 50% 75%  Left lateral flexion 50% minor pain 75% no pain  Right rotation 50% 50%  Left rotation 50% with pain 50% no pain   (Blank rows = not tested)     LE MMT:   MMT Right 11/22/2021 Left 11/22/2021 Right 12/06/2021 Left 12/06/2021  Hip flexion 4/5 3/5 4+/5 4+/5p!  Hip extension 3/5 3/5 4+/5 4+/5  Hip abduction 4/5 3/5 5/5 4+/5   (Blank rows = not tested)   LUMBAR SPECIAL TESTS:  Slump: (-) BIL SLR: (-) BIL PLE: (+)  Plank: (+) 4 seconds, terminated due to pain    FUNCTIONAL TESTS:  5xSTS: 23 seconds with use of hands and SPC Squat: 50% with [facility]  12/06/2021:  5xSTS: 13 seconds with use of hands       TODAY'S TREATMENT   [facility] Adult PT Treatment:                                                DATE: 12/15/2021 Therapeutic Exercise: Seated sciatic nerve glides 2x20 Knee planks 3x30sec Dead bugs 3x10 Bridge with RTB above knees 3x10 with 3-sec holds Hooklying LTR 2x10 BIL Standing Cybex hip extension with 25# 2x10 BIL Standing Cybex hip flexion with 25# 2x10 BIL Standing Cybex hip abduction with 25# 2x10 BIL Manual Therapy: N/A Neuromuscular re-ed: N/A Therapeutic Activity: N/A Modalities: N/A Self Care: N/A   [facility] Adult PT Treatment:                                                DATE: 2/29/2023 Therapeutic Exercise: Prone alternating hip extension 2x10 with 3-sec hold BIL Supine DKTC stretch 3x30sec Standing 15# kettlebell dead lift with table behind patient 3x8 Supine 90/90 abdominal crunches with straight arms holding YTB resistance 3x15 Standing Cybex hip extension with 25# 2x10 BIL Standing Cybex hip flexion with 25# 2x10 BIL Standing Cybex hip abduction with 25# 2x10 BIL Manual Therapy: N/A Neuromuscular re-ed: N/A Therapeutic Activity: N/A Modalities: N/A Self Care: N/A   [facility] Adult PT Treatment:                                                DATE: 12/06/2021 Therapeutic Exercise: Mini-squat side steps in // bars 2x2 laps Dead lifts with chair behind pt with two 5# kettlebells 3x6 Seated Pallof Press with 10# cable 2x10 with 5-sec hold BIL Seated lumbar rotation stretch 3x10 with 3-sec hold BIL Seated abdominal press-down with 13# cable 3x10 Seated trunk extension over chair back 3x30sec Seated diagonal flexion stretch with reach 2x10 to each foot Standing Cybex hip extension with 17.5# 2x10 BIL Manual Therapy: N/A Neuromuscular re-ed: N/A Therapeutic Activity:  N/A Modalities: N/A Self Care: N/A         PATIENT EDUCATION:  Education details: Pt instructed to continue her HEP daily Person  educated: Patient Education method: Explanation, Demonstration, and Handouts Education comprehension: verbalized understanding and returned demonstration     HOME EXERCISE PROGRAM: Access Code: [email or web address] URL: [email or web address] Date: 11/22/2021 Prepared by: Vanessa Pratt   Exercises Supine Bridge - 1 x daily - 7 x weekly - 3 sets - 10 reps - 3-sec hold Sidelying Hip Abduction - 1 x daily - 7 x weekly - 2 sets - 10 reps - 3-sec hold Abdominal Isometric Hold - FEET OFF TABLE* - 1 x daily - 7 x weekly - 4 sets - 30-sec hold Modified Thomas Stretch - 1 x daily - 7 x weekly - 2 sets - 1-min hold  Added 12/15/2021: Seated Slump Nerve Glide - 1 x daily - 7 x weekly - 3 sets - 20 reps Plank on Knees - 1 x daily - 7 x weekly - 3 sets - 30-sec hold     ASSESSMENT:   CLINICAL IMPRESSION: The pt has made excellent progress toward her FOTO goal upon re-assessment of the measure. She reports continued Lt hip and thigh pain today; after completing sciatic nerve glides, these symptoms improved and centralized. These, along with knee planks were added to the pt's HEP. She will continue to benefit from skilled PT to address her primary impairments and return to her prior level of function with less limitation.   REHAB POTENTIAL: Good   CLINICAL DECISION MAKING: Stable/uncomplicated   EVALUATION COMPLEXITY: Low     GOALS: Goals reviewed with patient? Yes   SHORT TERM GOALS:   STG Name Target Date Goal status  1 Pt will report understanding and adherence to her HEP in order to promote independence in the management of her primary impairments. Baseline: HEP provided at eval 12/06/2021: Pt reports daily adherence to her HEP 12/20/2021 ACHIEVED    LONG TERM GOALS:    LTG Name Target Date Goal status  1 Pt will achieve global lumbar AROM 80% of normal limits with 0-3/10 pain in order to get dressed with less limitation. Baseline: See AROM chart 12/06/2021: Partially achieved,  see updated AROM chart 01/17/2022 PROGRESSING  2 Pt will achieve a FOTO score of 51% in order to demonstrate improved functional ability as it relates to her concordant pain. Baseline: 32% 12/15/2021: 42% 01/17/2022 PROGRESSING  3 Pt will achieve global BIL hip strength of 4+/5 or greater in order to return to working out at [employer] with less limitation. Baseline: See MMT Chart 12/06/2021: Achieved 01/17/2022 ACHIEVED  4 Pt will report ability to stand/ walk >30 minutes with 0-3/10 pain in order to grocery shop with less limitation. Baseline: >5/10 pain after 10 minutes of standing/ walking 01/17/2022 INITIAL  5 Pt will report ability to sit >30 minutes with 0-3/10 pain in order to drive with less limitation. Baseline: >5/10 pain after 10 minutes of sitting. 01/17/2022 INITIAL    PLAN: PT FREQUENCY: 2x/week   PT DURATION: 8 weeks   PLANNED INTERVENTIONS: Therapeutic exercises, Therapeutic activity, Neuro Muscular re-education, Balance training, Gait training, Patient/Family education, Joint mobilization, Dry Needling, Electrical stimulation, Spinal mobilization, Cryotherapy, Taping, Traction, and Manual therapy   PLAN FOR NEXT SESSION: Progress early core/ hip strengthening and mobility    Vanessa [place], PT, DPT 12/15/21 12:12 PM

## 2021-12-15 ENCOUNTER — Ambulatory Visit: Payer: Medicare Other

## 2021-12-15 ENCOUNTER — Other Ambulatory Visit: Payer: Self-pay

## 2021-12-15 DIAGNOSIS — M6281 Muscle weakness (generalized): Secondary | ICD-10-CM

## 2021-12-15 DIAGNOSIS — G8929 Other chronic pain: Secondary | ICD-10-CM | POA: Diagnosis not present

## 2021-12-15 DIAGNOSIS — M545 Low back pain, unspecified: Secondary | ICD-10-CM | POA: Diagnosis not present

## 2021-12-15 DIAGNOSIS — R2681 Unsteadiness on feet: Secondary | ICD-10-CM

## 2021-12-18 IMAGING — CT CT CHEST LUNG CANCER SCREENING LOW DOSE W/O CM
1 series · 10 of 10 positions shown, 13 images · non-contrast
Comparison: Chest CT 04/25/2012.

CLINICAL DATA: 69-year-old female former smoker (quit 10 years ago)
with 40 pack-year history of smoking. Lung cancer screening
examination.

EXAM:
CT CHEST WITHOUT CONTRAST LOW-DOSE FOR LUNG CANCER SCREENING
TECHNIQUE: Multidetector CT imaging of the chest was performed following the
standard protocol without IV contrast.

[ct lung segmentation data · axial · 0.63mm/px · z∈[+1331,+1331]mm · 10 of 299 frames shown]
[frame 1/299  mediastinal]
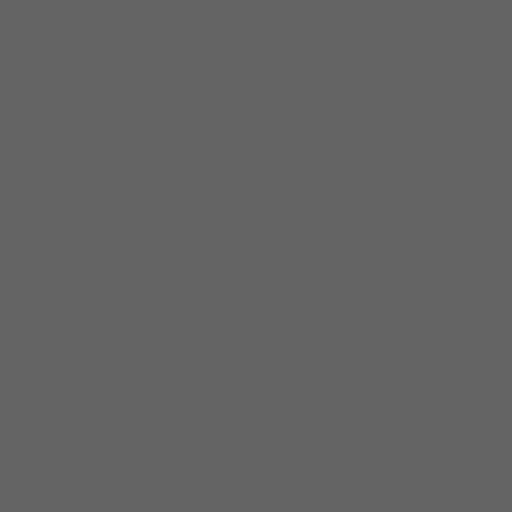
[frame 1/299  lung]
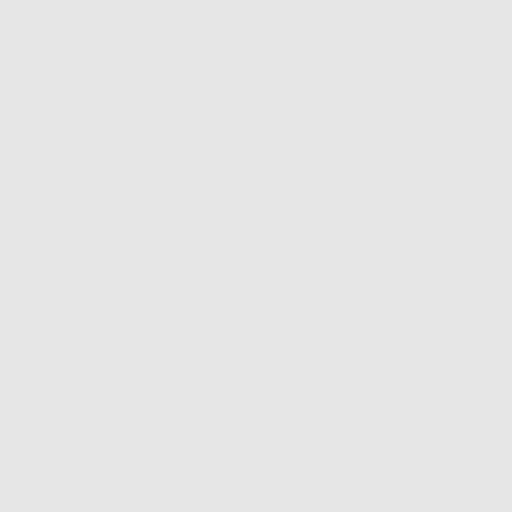
[frame 34/299  lung]
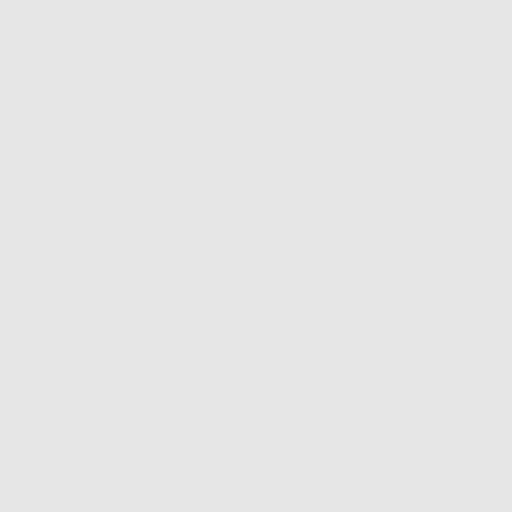
[frame 67/299  lung]
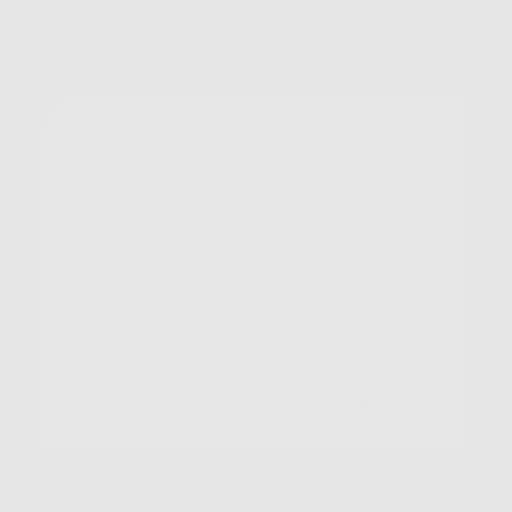
[frame 100/299  lung]
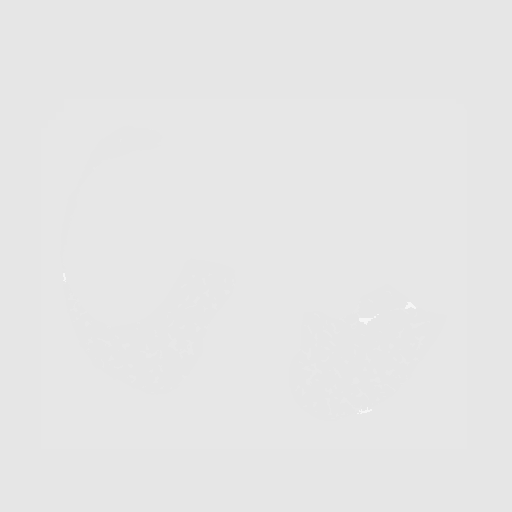
[frame 133/299  mediastinal]
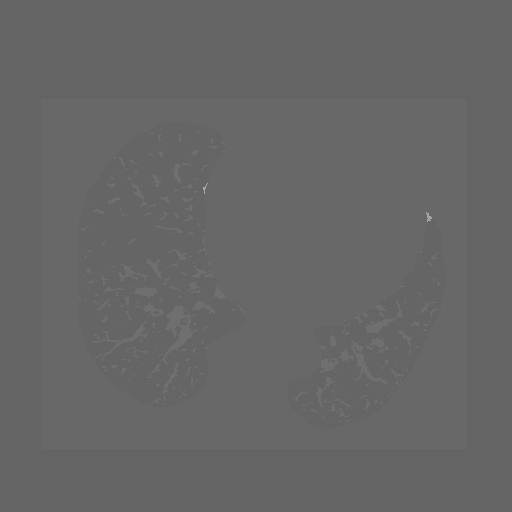
[frame 133/299  lung]
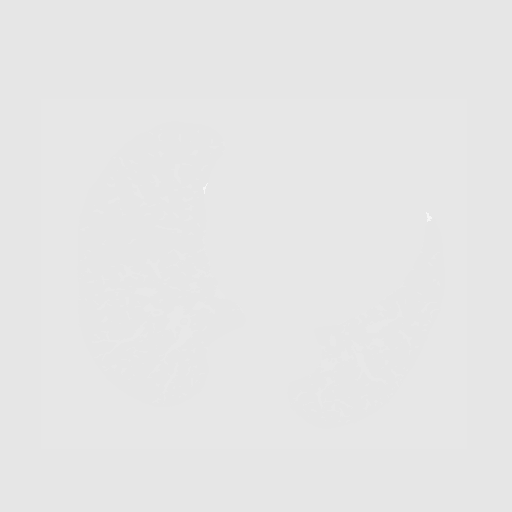
[frame 166/299  lung]
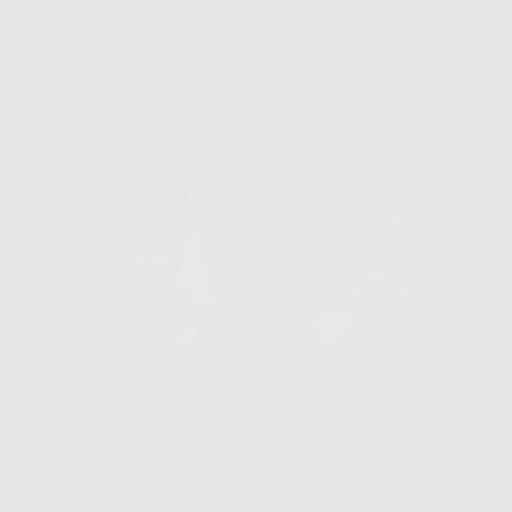
[frame 199/299  lung]
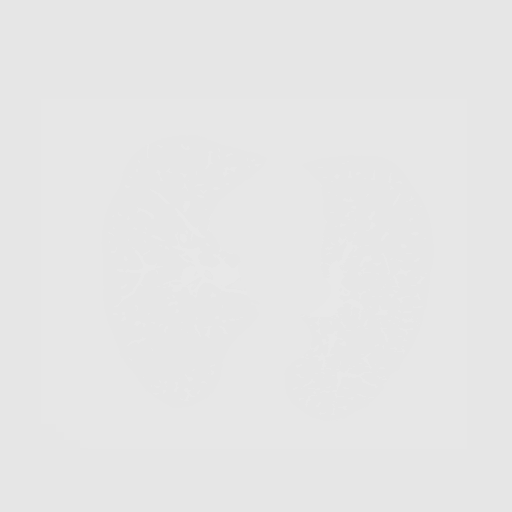
[frame 232/299  lung]
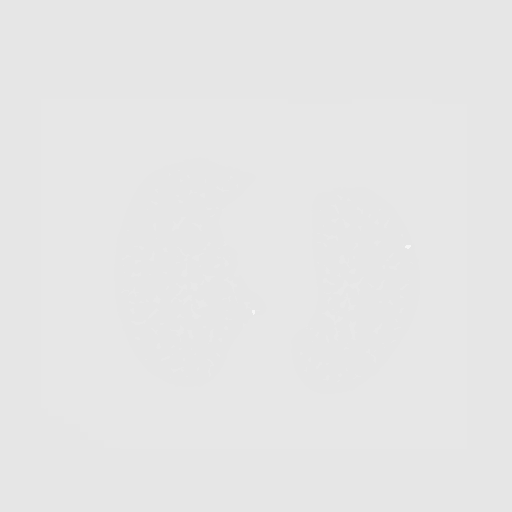
[frame 265/299  mediastinal]
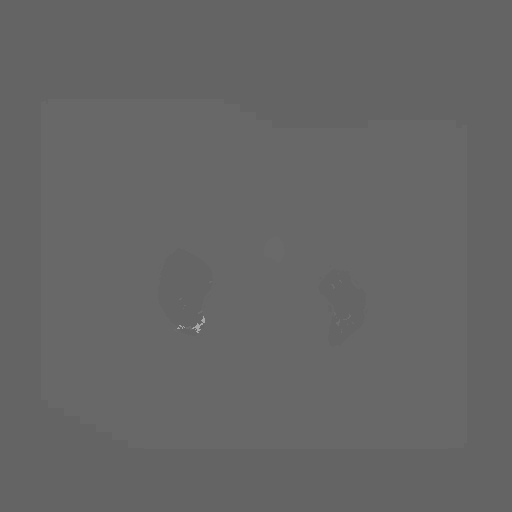
[frame 265/299  lung]
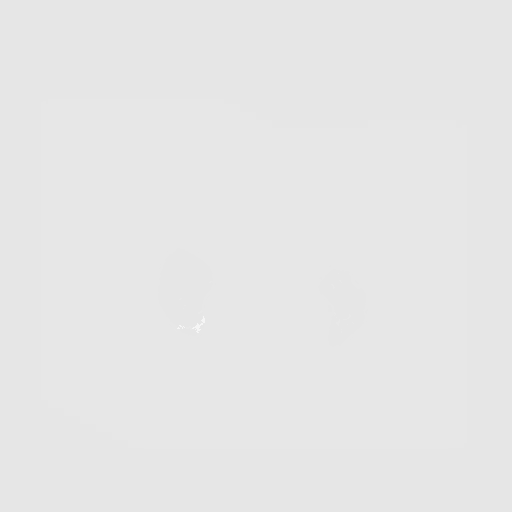
[frame 299/299  lung]
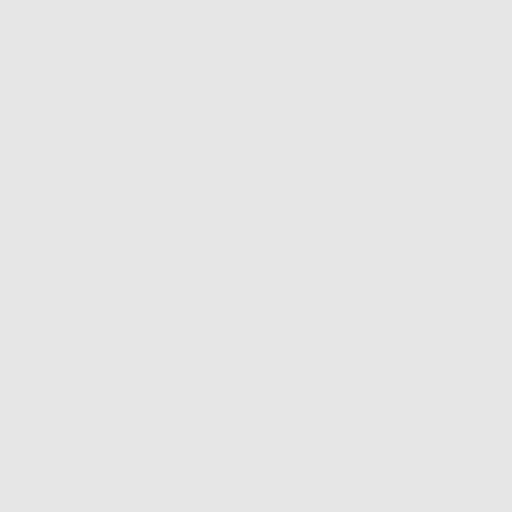

[10 of 10 positions shown; findings below may reference images not displayed]

FINDINGS: Cardiovascular: Heart size is normal. There is no significant
pericardial fluid, thickening or pericardial calcification. There is
aortic atherosclerosis, as well as atherosclerosis of the great
vessels of the mediastinum and the coronary arteries, including
calcified atherosclerotic plaque in the left anterior descending and
right coronary arteries. Left anterior descending coronary artery
stent.

Mediastinum/Nodes: No pathologically enlarged mediastinal or hilar
lymph nodes. Please note that accurate exclusion of hilar adenopathy
is limited on noncontrast CT scans. Esophagus is unremarkable in
appearance. No axillary lymphadenopathy. Surgical clips in the right
axillary region, likely from prior lymph node dissection.

Lungs/Pleura: Small pulmonary nodules, largest of which is in the
are noted in the lungs bilaterally posterior aspect of the right
upper lobe (axial image 52 of series 3), with a volume derived mean
diameter of 4.6 mm. No other larger more suspicious appearing
pulmonary nodules or masses are noted. No acute consolidative
airspace disease. No pleural effusions. Diffuse bronchial wall
thickening with mild centrilobular and paraseptal emphysema.

Upper Abdomen: Aortic atherosclerosis.

Musculoskeletal: There are no aggressive appearing lytic or blastic
lesions noted in the visualized portions of the skeleton.
IMPRESSION: 1. Lung-RADS 2S, benign appearance or behavior. Continue annual
screening with low-dose chest CT without contrast in 12 months.
2. The "S" modifier above refers to potentially clinically
significant non lung cancer related findings. Specifically, there is
aortic atherosclerosis, in addition to 2 vessel coronary artery
disease. Please note that although the presence of coronary artery
calcium documents the presence of coronary artery disease, the
severity of this disease and any potential stenosis cannot be
assessed on this non-gated CT examination. Assessment for potential
risk factor modification, dietary therapy or pharmacologic therapy
may be warranted, if clinically indicated.
3. Mild diffuse bronchial wall thickening with mild centrilobular
and paraseptal emphysema; imaging findings suggestive of underlying
COPD.

Aortic Atherosclerosis ([id number]-[id number].B) and Emphysema ([id number].J).

## 2021-12-19 NOTE — Therapy (Signed)
OUTPATIENT PHYSICAL THERAPY TREATMENT NOTE   Patient Name: Samantha Mcbride MRN: [id number] DOB:1951/03/16, 71 y.o., female Today's Date: 12/06/2021  PCP: Antony Contras, MD REFERRING PROVIDER: Eustace Moore, MD   PT End of Session - 12/06/21 1123     Visit Number 4    Number of Visits 17    Date for PT Re-Evaluation 01/17/22    Authorization Type [employer] MCR    Authorization Time Period FOTO v6,v10; KX mod at v15    Progress Note Due on Visit 10    PT Start Time 1130    PT Stop Time 1215    PT Time Calculation (min) 45 min    Activity Tolerance Patient tolerated treatment well    Behavior During Therapy [facility] for tasks assessed/performed               Past Medical History:  Diagnosis Date   Anemia    iron deficiency anemia    CAD S/P percutaneous coronary angioplasty 08/2011   Promus DES - mid LAD 3.5 mm x 24 mm (3.72 mm)    Cancer (HCC)    Chronic kidney disease    stage 4 kidney disease per pt dx 04/6194   Complication of anesthesia    Dyslipidemia, goal LDL below 70     on statin, close to goal   Family history of breast cancer    Family history of skin cancer    Former moderate cigarette smoker (10-19 per day)    Glucose intolerance (impaired glucose tolerance)    H/O thyroid nodule    Benign   History 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 --> Promus DES =>  EF 40 to 45% (similar to 2012) stable wall motion normality (severe HK of midapical anterior apical wall-consistent with prior infarct).  GRII DD.  Normal valves. => Stable compared to 08/30/2011.  EF was still 40 to 45%.   Hypertension    PONV (postoperative nausea and vomiting)    no issues with surgery on 05-11-2019   Rheumatoid arthritis ([place])    managed on humira    SOB (shortness of breath) on exertion    reports "its been that way since my heart attack " repots no recurrence of MI sx since that time    Past Surgical  History:  Procedure Laterality Date   ABDOMINAL AORTAGRAM N/A 08/27/2011   Procedure: ABDOMINAL Maxcine Ham;  Surgeon: Troy Sine, MD;  Location: [facility] C[facility] LAB;  Service: Cardiovascular;  Laterality: N/A;   ANKLE SURGERY Right 1995   per pt ankle surgery here at [place] EXCISIONAL BIOPSY Left    BREAST LUMPECTOMY WITH RADIOACTIVE SEED AND SENTINEL LYMPH NODE BIOPSY Right 08/05/2020   Procedure: RIGHT BREAST LUMPECTOMY WITH RADIOACTIVE SEED AND SENTINEL LYMPH NODE BIOPSY;  Surgeon: [name], MD;  Location: [place];  Service: General;  Laterality: Right;   CYSTOSCOPY WITH RETROGRADE PYELOGRAM, URETEROSCOPY AND STENT PLACEMENT Bilateral 05/11/2019   Procedure: CYSTOSCOPY WITH RETROGRADE PYELOGRAM,  AND STENT PLACEMENT;  Surgeon: Lucas Mallow, MD;  Location: [facility];  Service: Urology;  Laterality: Bilateral;   CYSTOSCOPY/URETEROSCOPY/HOLMIUM LASER/STENT PLACEMENT Bilateral 05/25/2019   Procedure: CYSTOSCOPY BILATERAL URETEROSCOPY/HOLMIUM LASER/STENT PLACEMENT;  Surgeon: Lucas Mallow, MD;  Location: [facility];  Service: Urology;  Laterality: Bilateral;   LEFT HEART CATHETERIZATION WITH CORONARY ANGIOGRAM N/A 08/27/2011   Procedure: LEFT HEART CATHETERIZATION WITH CORONARY  ANGIOGRAM;  Surgeon: Troy Sine, MD;  Location: [facility] CATH LAB;  Service: Cardiovascular;;For anterior STEMI/cardiac arrest -- 100% mid LAD occlusion   PERCUTANEOUS CORONARY STENT INTERVENTION (PCI-S) N/A 08/27/2011   Procedure: PERCUTANEOUS CORONARY STENT INTERVENTION (PCI-S);  Surgeon: Troy Sine, MD;  Location: [facility] CATH LAB;  Service: Cardiovascular;;  mid LAD PCI --> Promus Element DES 3.5 mm at 24 mm (3.72 mm)   TRANSTHORACIC ECHOCARDIOGRAM  08/2011   EF 40-45%, moderate at K. of mid and distal inferior septum and anterior apical myocardium. Grade 1 diastolic function. -- Followup echocardiogram to reassess his EF was denied by insurance company   TRANSTHORACIC ECHOCARDIOGRAM  07/20/2020     EF 40 to 45% (similar to 2012) stable wall motion normality (severe HK of midapical anterior apical wall-consistent with prior infarct).  GRII DD.  Normal valves. => Stable compared to 08/30/2011.  EF was still 40 to 45%.   Patient Active Problem List   Diagnosis Date Noted   Breast cancer ([place]) 10/03/2021   Osteoarthritis 10/03/2021   Osteoporosis 07/10/2021   Thoracic aortic atherosclerosis (HCC)-seen on CT 05/11/2021   COPD (chronic obstructive pulmonary disease) with emphysema ([place]) noted on CT 05/11/2021   Age-related osteoporosis without current pathological fracture 02/02/2021   Chronic kidney disease, stage 4 (severe) ([place]) 02/02/2021   Insomnia 02/02/2021   Localized, primary osteoarthritis of hand 02/02/2021   Osteopenia 02/02/2021   Restless legs 02/02/2021   Rheumatoid arthritis ([place]) 02/02/2021   Sciatica 02/02/2021   Vitamin D deficiency 02/02/2021   Genetic testing 07/15/2020   Preoperative cardiovascular examination 07/11/2020   Family history of breast cancer    Family history of skin cancer    Malignant neoplasm of upper-outer quadrant of right breast in female, estrogen receptor positive ([place]) 06/15/2020   Pain in left knee 05/28/2019   Ureteral calculus 05/11/2019   Obesity (BMI 30-39.9) 07/18/2013   Essential hypertension    Hyperlipidemia associated with type 2 diabetes mellitus ([place]) 07/16/2013   Glucose intolerance (impaired glucose tolerance) 07/16/2013   Ventricular tachycardia, sustained; Peri-infarct.  No further episodes 08/28/2011   Hypokalemia 08/27/2011   H/O Anterior STEMI:  Proximal LAD  insertion of a 3.5x24 mm Promus element DES (08/2011) 08/27/2011   CAD S/P percutaneous coronary angioplasty 08/16/2011    REFERRING DIAG: M54.16 (ICD-10-CM) - Lumbar radiculopathy  THERAPY DIAG:  Chronic bilateral low back pain, unspecified whether sciatica present  Muscle weakness (generalized)  Unsteadiness on feet  PERTINENT HISTORY: Hx of breast  cancer, HTN  SUBJECTIVE: Pt reports that she has increased Lt-sided LBP and Lt hip pain after picking up and spreading out pine straw around her house yesterday. She reports doing her HEP every other day.  PAIN:  Are you having pain? Yes NPRS scale: 3/10 Pain location: Lt lateral hip, global knee PAIN TYPE: aching, dull, and sharp Pain description: intermittent  Aggravating factors: prolonged sitting >10 minutes, prolonged standing/ walking >10 minutes, laying prone, and bending over Relieving factors: pain cream, heating pad, and laying on side     OBJECTIVE:   *Unless otherwise noted, objective information collected previously* DIAGNOSTIC FINDINGS:  None available currently   PATIENT SURVEYS:  FOTO 32%, predicted 51% in 12 visits 12/15/2021: 42%   SCREENING FOR RED FLAGS: Bowel or bladder incontinence: No Cauda equina syndrome: No   COGNITION:          Overall cognitive status: Within functional limits for tasks assessed  SENSATION:          Light touch: Appears intact   MUSCLE LENGTH: Thomas test: Moderate limitation BIL   POSTURE:  Forward head/shoulder posture, increased lumbar lordosis   PALPATION: TTP to BIL Lt>Rt paraspinals/ QL Painful CPA's to T10-L5   LUMBAR AROM   A/PROM AROM  11/22/2021 AROM 12/06/2021  Flexion 75%, painful on ascension 100%, no pain  Extension 25%, painful 50%, no pain  Right lateral flexion 50% 75%  Left lateral flexion 50% minor pain 75% no pain  Right rotation 50% 50%  Left rotation 50% with pain 50% no pain   (Blank rows = not tested)     LE MMT:   MMT Right 11/22/2021 Left 11/22/2021 Right 12/06/2021 Left 12/06/2021  Hip flexion 4/5 3/5 4+/5 4+/5p!  Hip extension 3/5 3/5 4+/5 4+/5  Hip abduction 4/5 3/5 5/5 4+/5   (Blank rows = not tested)   LUMBAR SPECIAL TESTS:  Slump: (-) BIL SLR: (-) BIL PLE: (+)  Plank: (+) 4 seconds, terminated due to pain   12/20/2021: Plank test: 12.5 seconds    FUNCTIONAL TESTS:  5xSTS: 23 seconds with use of hands and SPC Squat: 50% with [facility]  12/06/2021: 5xSTS: 13 seconds with use of hands       TODAY'S TREATMENT   [facility] Adult PT Treatment:                                                DATE: 12/20/2021 Therapeutic Exercise: Prone swimmers 2x20 Knee planks 3x30sec Seated sciatic nerve glides 2x20 Mini kettlebell swings with 10# kettlebell 3x10 with back of legs at table Pallof press walkout with green theraband 2x6 with 5-sec hold at end of walkout BIL Seated abdominal isometric press-down with 13# cable 2x10 Manual Therapy: N/A Neuromuscular re-ed: N/A Therapeutic Activity: N/A Modalities: N/A Self Care: N/A   [facility] Adult PT Treatment:                                                DATE: 12/15/2021 Therapeutic Exercise: Seated sciatic nerve glides 2x20 Knee planks 3x30sec Dead bugs 3x10 Bridge with RTB above knees 3x10 with 3-sec holds Hooklying LTR 2x10 BIL Standing Cybex hip extension with 25# 2x10 BIL Standing Cybex hip flexion with 25# 2x10 BIL Standing Cybex hip abduction with 25# 2x10 BIL Manual Therapy: N/A Neuromuscular re-ed: N/A Therapeutic Activity: N/A Modalities: N/A Self Care: N/A   [facility] Adult PT Treatment:                                                DATE: 2/29/2023 Therapeutic Exercise: Prone alternating hip extension 2x10 with 3-sec hold BIL Supine DKTC stretch 3x30sec Standing 15# kettlebell dead lift with table behind patient 3x8 Supine 90/90 abdominal crunches with straight arms holding YTB resistance 3x15 Standing Cybex hip extension with 25# 2x10 BIL Standing Cybex hip flexion with 25# 2x10 BIL Standing Cybex hip abduction with 25# 2x10 BIL Manual Therapy: N/A Neuromuscular re-ed: N/A Therapeutic Activity: N/A Modalities: N/A Self Care: N/A          PATIENT EDUCATION:  Education details: Pt instructed to continue her HEP daily Person educated: Patient Education method:  Explanation, Demonstration, and Handouts Education comprehension: verbalized understanding and returned demonstration     HOME EXERCISE PROGRAM: Access Code: [email or web address] URL: [email or web address] Date: 11/22/2021 Prepared by: Vanessa Newberry   Exercises Supine Bridge - 1 x daily - 7 x weekly - 3 sets - 10 reps - 3-sec hold Sidelying Hip Abduction - 1 x daily - 7 x weekly - 2 sets - 10 reps - 3-sec hold Abdominal Isometric Hold - FEET OFF TABLE* - 1 x daily - 7 x weekly - 4 sets - 30-sec hold Modified Thomas Stretch - 1 x daily - 7 x weekly - 2 sets - 1-min hold  Added 12/15/2021: Seated Slump Nerve Glide - 1 x daily - 7 x weekly - 3 sets - 20 reps Plank on Knees - 1 x daily - 7 x weekly - 3 sets - 30-sec hold     ASSESSMENT:   CLINICAL IMPRESSION: Pt responded well to all interventions today, demonstrating good form and no increase in pain with selected exercises. She was in a heightened pain state at the start of treatment, but reports feeling "looser" at the end of the session. Upon re-assessment of functional core strength through plank testing, the pt has made good progress in this area. She will continue to benefit from skilled PT to address her primary impairments and return to her prior level of function with less limitation.   REHAB POTENTIAL: Good   CLINICAL DECISION MAKING: Stable/uncomplicated   EVALUATION COMPLEXITY: Low     GOALS: Goals reviewed with patient? Yes   SHORT TERM GOALS:   STG Name Target Date Goal status  1 Pt will report understanding and adherence to her HEP in order to promote independence in the management of her primary impairments. Baseline: HEP provided at eval 12/06/2021: Pt reports daily adherence to her HEP 12/20/2021 ACHIEVED    LONG TERM GOALS:    LTG Name Target Date Goal status  1 Pt will achieve global lumbar AROM 80% of normal limits with 0-3/10 pain in order to get dressed with less limitation. Baseline: See AROM  chart 12/06/2021: Partially achieved, see updated AROM chart 01/17/2022 PROGRESSING  2 Pt will achieve a FOTO score of 51% in order to demonstrate improved functional ability as it relates to her concordant pain. Baseline: 32% 12/15/2021: 42% 01/17/2022 PROGRESSING  3 Pt will achieve global BIL hip strength of 4+/5 or greater in order to return to working out at [employer] with less limitation. Baseline: See MMT Chart 12/06/2021: Achieved 01/17/2022 ACHIEVED  4 Pt will report ability to stand/ walk >30 minutes with 0-3/10 pain in order to grocery shop with less limitation. Baseline: >5/10 pain after 10 minutes of standing/ walking 01/17/2022 INITIAL  5 Pt will report ability to sit >30 minutes with 0-3/10 pain in order to drive with less limitation. Baseline: >5/10 pain after 10 minutes of sitting. 01/17/2022 INITIAL    PLAN: PT FREQUENCY: 2x/week   PT DURATION: 8 weeks   PLANNED INTERVENTIONS: Therapeutic exercises, Therapeutic activity, Neuro Muscular re-education, Balance training, Gait training, Patient/Family education, Joint mobilization, Dry Needling, Electrical stimulation, Spinal mobilization, Cryotherapy, Taping, Traction, and Manual therapy   PLAN FOR NEXT SESSION: Progress early core/ hip strengthening and mobility    Vanessa , PT, DPT 12/20/21 12:14 PM

## 2021-12-20 ENCOUNTER — Other Ambulatory Visit: Payer: Self-pay

## 2021-12-20 ENCOUNTER — Ambulatory Visit: Payer: Medicare Other

## 2021-12-20 DIAGNOSIS — G8929 Other chronic pain: Secondary | ICD-10-CM

## 2021-12-20 DIAGNOSIS — M6281 Muscle weakness (generalized): Secondary | ICD-10-CM | POA: Diagnosis not present

## 2021-12-20 DIAGNOSIS — R2681 Unsteadiness on feet: Secondary | ICD-10-CM | POA: Diagnosis not present

## 2021-12-20 DIAGNOSIS — M545 Low back pain, unspecified: Secondary | ICD-10-CM | POA: Diagnosis not present

## 2021-12-21 NOTE — Therapy (Signed)
OUTPATIENT PHYSICAL THERAPY TREATMENT NOTE   Patient Name: Samantha Mcbride MRN: [id number] DOB:Jan 02, 1951, 71 y.o., female Today's Date: 12/06/2021  PCP: Antony Contras, MD REFERRING PROVIDER: Eustace Moore, MD   PT End of Session - 12/06/21 1123     Visit Number 4    Number of Visits 17    Date for PT Re-Evaluation 01/17/22    Authorization Type U[employer] MCR    Authorization Time Period FOTO v6,v10; KX mod at v15    Progress Note Due on Visit 10    PT Start Time 1130    PT Stop Time 1215    PT Time Calculation (min) 45 min    Activity Tolerance Patient tolerated treatment well    Behavior During Therapy [facility] for tasks assessed/performed               Past Medical History:  Diagnosis Date   Anemia    iron deficiency anemia    CAD S/P percutaneous coronary angioplasty 08/2011   Promus DES - mid LAD 3.5 mm x 24 mm (3.72 mm)    Cancer (HCC)    Chronic kidney disease    stage 4 kidney disease per pt dx 10/7406   Complication of anesthesia    Dyslipidemia, goal LDL below 70     on statin, close to goal   Family history of breast cancer    Family history of skin cancer    Former moderate cigarette smoker (10-19 per day)    Glucose intolerance (impaired glucose tolerance)    H/O thyroid nodule    Benign   History 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 --> Promus DES =>  EF 40 to 45% (similar to 2012) stable wall motion normality (severe HK of midapical anterior apical wall-consistent with prior infarct).  GRII DD.  Normal valves. => Stable compared to 08/30/2011.  EF was still 40 to 45%.   Hypertension    PONV (postoperative nausea and vomiting)    no issues with surgery on 05-11-2019   Rheumatoid arthritis ([place])    managed on humira    SOB (shortness of breath) on exertion    reports "its been that way since my heart attack " repots no recurrence of MI sx since that time    Past Surgical  History:  Procedure Laterality Date   ABDOMINAL AORTAGRAM N/A 08/27/2011   Procedure: ABDOMINAL Maxcine Ham;  Surgeon: Troy Sine, MD;  Location: [facility] CATH LAB;  Service: Cardiovascular;  Laterality: N/A;   ANKLE SURGERY Right 1995   per pt ankle surgery here at [place] EXCISIONAL BIOPSY Left    BREAST LUMPECTOMY WITH RADIOACTIVE SEED AND SENTINEL LYMPH NODE BIOPSY Right 08/05/2020   Procedure: RIGHT BREAST LUMPECTOMY WITH RADIOACTIVE SEED AND SENTINEL LYMPH NODE BIOPSY;  Surgeon: [name], MD;  Location: [place];  Service: General;  Laterality: Right;   CYSTOSCOPY WITH RETROGRADE PYELOGRAM, URETEROSCOPY AND STENT PLACEMENT Bilateral 05/11/2019   Procedure: CYSTOSCOPY WITH RETROGRADE PYELOGRAM,  AND STENT PLACEMENT;  Surgeon: Lucas Mallow, MD;  Location: [facility];  Service: Urology;  Laterality: Bilateral;   CYSTOSCOPY/URETEROSCOPY/HOLMIUM LASER/STENT PLACEMENT Bilateral 05/25/2019   Procedure: CYSTOSCOPY BILATERAL URETEROSCOPY/HOLMIUM LASER/STENT PLACEMENT;  Surgeon: Lucas Mallow, MD;  Location: [facility];  Service: Urology;  Laterality: Bilateral;   LEFT HEART CATHETERIZATION WITH CORONARY ANGIOGRAM N/A 08/27/2011   Procedure: LEFT HEART CATHETERIZATION WITH CORONARY  ANGIOGRAM;  Surgeon: Troy Sine, MD;  Location: [facility] CATH LAB;  Service: Cardiovascular;;For anterior STEMI/cardiac arrest -- 100% mid LAD occlusion   PERCUTANEOUS CORONARY STENT INTERVENTION (PCI-S) N/A 08/27/2011   Procedure: PERCUTANEOUS CORONARY STENT INTERVENTION (PCI-S);  Surgeon: Troy Sine, MD;  Location: [facility] CATH LAB;  Service: Cardiovascular;;  mid LAD PCI --> Promus Element DES 3.5 mm at 24 mm (3.72 mm)   TRANSTHORACIC ECHOCARDIOGRAM  08/2011   EF 40-45%, moderate at K. of mid and distal inferior septum and anterior apical myocardium. Grade 1 diastolic function. -- Followup echocardiogram to reassess his EF was denied by insurance company   TRANSTHORACIC ECHOCARDIOGRAM  07/20/2020     EF 40 to 45% (similar to 2012) stable wall motion normality (severe HK of midapical anterior apical wall-consistent with prior infarct).  GRII DD.  Normal valves. => Stable compared to 08/30/2011.  EF was still 40 to 45%.   Patient Active Problem List   Diagnosis Date Noted   Breast cancer ([place]) 10/03/2021   Osteoarthritis 10/03/2021   Osteoporosis 07/10/2021   Thoracic aortic atherosclerosis (HCC)-seen on CT 05/11/2021   COPD (chronic obstructive pulmonary disease) with emphysema ([place]) noted on CT 05/11/2021   Age-related osteoporosis without current pathological fracture 02/02/2021   Chronic kidney disease, stage 4 (severe) ([place]) 02/02/2021   Insomnia 02/02/2021   Localized, primary osteoarthritis of hand 02/02/2021   Osteopenia 02/02/2021   Restless legs 02/02/2021   Rheumatoid arthritis ([place]) 02/02/2021   Sciatica 02/02/2021   Vitamin D deficiency 02/02/2021   Genetic testing 07/15/2020   Preoperative cardiovascular examination 07/11/2020   Family history of breast cancer    Family history of skin cancer    Malignant neoplasm of upper-outer quadrant of right breast in female, estrogen receptor positive ([place]) 06/15/2020   Pain in left knee 05/28/2019   Ureteral calculus 05/11/2019   Obesity (BMI 30-39.9) 07/18/2013   Essential hypertension    Hyperlipidemia associated with type 2 diabetes mellitus ([place]) 07/16/2013   Glucose intolerance (impaired glucose tolerance) 07/16/2013   Ventricular tachycardia, sustained; Peri-infarct.  No further episodes 08/28/2011   Hypokalemia 08/27/2011   H/O Anterior STEMI:  Proximal LAD  insertion of a 3.5x24 mm Promus element DES (08/2011) 08/27/2011   CAD S/P percutaneous coronary angioplasty 08/16/2011    REFERRING DIAG: M54.16 (ICD-10-CM) - Lumbar radiculopathy  THERAPY DIAG:  Chronic bilateral low back pain, unspecified whether sciatica present  Muscle weakness (generalized)  Unsteadiness on feet  PERTINENT HISTORY: Hx of breast  cancer, HTN  SUBJECTIVE: Pt reports feeling good today, adding that she has no back pain today, only Lt hip and knee pain. She reports doing her HEP daily.  PAIN:  Are you having pain? Yes NPRS scale: 2/10 Pain location: Lt lateral hip, global knee PAIN TYPE: aching, dull, and sharp Pain description: intermittent  Aggravating factors: prolonged sitting >10 minutes, prolonged standing/ walking >10 minutes, laying prone, and bending over Relieving factors: pain cream, heating pad, and laying on side     OBJECTIVE:   *Unless otherwise noted, objective information collected previously* DIAGNOSTIC FINDINGS:  None available currently   PATIENT SURVEYS:  FOTO 32%, predicted 51% in 12 visits 12/15/2021: 42%   SCREENING FOR RED FLAGS: Bowel or bladder incontinence: No Cauda equina syndrome: No   COGNITION:          Overall cognitive status: Within functional limits for tasks assessed  SENSATION:          Light touch: Appears intact   MUSCLE LENGTH: Thomas test: Moderate limitation BIL   POSTURE:  Forward head/shoulder posture, increased lumbar lordosis   PALPATION: TTP to BIL Lt>Rt paraspinals/ QL Painful CPA's to T10-L5   LUMBAR AROM   A/PROM AROM  11/22/2021 AROM 12/06/2021  Flexion 75%, painful on ascension 100%, no pain  Extension 25%, painful 50%, no pain  Right lateral flexion 50% 75%  Left lateral flexion 50% minor pain 75% no pain  Right rotation 50% 50%  Left rotation 50% with pain 50% no pain   (Blank rows = not tested)     LE MMT:   MMT Right 11/22/2021 Left 11/22/2021 Right 12/06/2021 Left 12/06/2021  Hip flexion 4/5 3/5 4+/5 4+/5p!  Hip extension 3/5 3/5 4+/5 4+/5  Hip abduction 4/5 3/5 5/5 4+/5   (Blank rows = not tested)   LUMBAR SPECIAL TESTS:  Slump: (-) BIL SLR: (-) BIL PLE: (+)  Plank: (+) 4 seconds, terminated due to pain   12/20/2021: Plank test: 12.5 seconds  12/22/2021: (+) ober's on Lt   FUNCTIONAL TESTS:  5xSTS:  23 seconds with use of hands and SPC Squat: 50% with [place]  12/06/2021: 5xSTS: 13 seconds with use of hands       TODAY'S TREATMENT   [facility] Adult PT Treatment:                                                DATE: 12/22/2021 Therapeutic Exercise: Sidelying Lt IT band stretch x1 minute Standing IT band stretch with overhead reach x2 minutes Dead lift with 25# barbell 2x8 Seated trunk extension stretch 2x10 Seated abdominal press-down with 17# cable 3x10 Seated Pallof press with 7# cable 2x10 with 5-sec hold BIL Seated trunk rotation stretch 2x10 BIL Seated trunk extension at [employer] with 20# cable held at chest 3x10 Forward flexion physioball stretch x20 with 5-sec holds Manual Therapy: N/A Neuromuscular re-ed: N/A Therapeutic Activity: N/A Modalities: N/A Self Care: N/A   [facility] Adult PT Treatment:                                                DATE: 12/20/2021 Therapeutic Exercise: Prone swimmers 2x20 Knee planks 3x30sec Seated sciatic nerve glides 2x20 Mini kettlebell swings with 10# kettlebell 3x10 with back of legs at table Pallof press walkout with green theraband 2x6 with 5-sec hold at end of walkout BIL Seated abdominal isometric press-down with 13# cable 2x10 Manual Therapy: N/A Neuromuscular re-ed: N/A Therapeutic Activity: N/A Modalities: N/A Self Care: N/A   [facility] Adult PT Treatment:                                                DATE: 12/15/2021 Therapeutic Exercise: Seated sciatic nerve glides 2x20 Knee planks 3x30sec Dead bugs 3x10 Bridge with RTB above knees 3x10 with 3-sec holds Hooklying LTR 2x10 BIL Standing Cybex hip extension with 25# 2x10 BIL Standing Cybex hip flexion with 25# 2x10 BIL Standing Cybex hip abduction with 25# 2x10 BIL Manual Therapy: N/A Neuromuscular re-ed: N/A Therapeutic  Activity: N/A Modalities: N/A Self Care: N/A         PATIENT EDUCATION:  Education details: Pt instructed to continue her HEP daily Person  educated: Patient Education method: Explanation, Demonstration, and Handouts Education comprehension: verbalized understanding and returned demonstration     HOME EXERCISE PROGRAM: Access Code: [email or web address] URL: [email or web address] Date: 11/22/2021 Prepared by: Vanessa Midway   Exercises Supine Bridge - 1 x daily - 7 x weekly - 3 sets - 10 reps - 3-sec hold Sidelying Hip Abduction - 1 x daily - 7 x weekly - 2 sets - 10 reps - 3-sec hold Abdominal Isometric Hold - FEET OFF TABLE* - 1 x daily - 7 x weekly - 4 sets - 30-sec hold Modified Thomas Stretch - 1 x daily - 7 x weekly - 2 sets - 1-min hold  Added 12/15/2021: Seated Slump Nerve Glide - 1 x daily - 7 x weekly - 3 sets - 20 reps Plank on Knees - 1 x daily - 7 x weekly - 3 sets - 30-sec hold  Added 12/22/2021: Standing ITB Stretch - 1 x daily - 7 x weekly - 2 sets - 1-min hold     ASSESSMENT:   CLINICAL IMPRESSION: At the start of the session, the pt demonstrates a positive Ober's test on the Rt for severe IT band tightness. Due to not tolerating sidelying IT band stretching, this was deferred in favor of a standing IT band stretch with overhead reach, to which the pt responded well. The pt also demonstrates the ability to perform traditional dead lifts with a 25# barbell today. She will continue to benefit from skilled PT to address her primary impairments and return to her prior level of function with less limitation.   REHAB POTENTIAL: Good   CLINICAL DECISION MAKING: Stable/uncomplicated   EVALUATION COMPLEXITY: Low     GOALS: Goals reviewed with patient? Yes   SHORT TERM GOALS:   STG Name Target Date Goal status  1 Pt will report understanding and adherence to her HEP in order to promote independence in the management of her primary impairments. Baseline: HEP provided at eval 12/06/2021: Pt reports daily adherence to her HEP 12/20/2021 ACHIEVED    LONG TERM GOALS:    LTG Name Target Date Goal status   1 Pt will achieve global lumbar AROM 80% of normal limits with 0-3/10 pain in order to get dressed with less limitation. Baseline: See AROM chart 12/06/2021: Partially achieved, see updated AROM chart 01/17/2022 PROGRESSING  2 Pt will achieve a FOTO score of 51% in order to demonstrate improved functional ability as it relates to her concordant pain. Baseline: 32% 12/15/2021: 42% 01/17/2022 PROGRESSING  3 Pt will achieve global BIL hip strength of 4+/5 or greater in order to return to working out at [employer] with less limitation. Baseline: See MMT Chart 12/06/2021: Achieved 01/17/2022 ACHIEVED  4 Pt will report ability to stand/ walk >30 minutes with 0-3/10 pain in order to grocery shop with less limitation. Baseline: >5/10 pain after 10 minutes of standing/ walking 01/17/2022 INITIAL  5 Pt will report ability to sit >30 minutes with 0-3/10 pain in order to drive with less limitation. Baseline: >5/10 pain after 10 minutes of sitting. 01/17/2022 INITIAL    PLAN: PT FREQUENCY: 2x/week   PT DURATION: 8 weeks   PLANNED INTERVENTIONS: Therapeutic exercises, Therapeutic activity, Neuro Muscular re-education, Balance training, Gait training, Patient/Family education, Joint mobilization, Dry Needling, Electrical stimulation, Spinal mobilization, Cryotherapy, Taping, Traction, and Manual therapy  PLAN FOR NEXT SESSION: Progress early core/ hip strengthening and mobility    Vanessa Carefree, PT, DPT 12/22/21 12:09 PM

## 2021-12-22 ENCOUNTER — Ambulatory Visit: Payer: Medicare Other

## 2021-12-22 ENCOUNTER — Other Ambulatory Visit: Payer: Self-pay

## 2021-12-22 DIAGNOSIS — G8929 Other chronic pain: Secondary | ICD-10-CM

## 2021-12-22 DIAGNOSIS — M545 Low back pain, unspecified: Secondary | ICD-10-CM

## 2021-12-22 DIAGNOSIS — M6281 Muscle weakness (generalized): Secondary | ICD-10-CM

## 2021-12-22 DIAGNOSIS — R2681 Unsteadiness on feet: Secondary | ICD-10-CM

## 2021-12-28 NOTE — Therapy (Signed)
?OUTPATIENT PHYSICAL THERAPY TREATMENT NOTE ? ? ?Patient Name: Samantha Mcbride ?MRN: [id number] ?DOB:Feb 16, 1951, 71 y.o., female ?Today's Date: 12/06/2021 ? ?PCP: Antony Contras, MD ?REFERRING PROVIDER: Eustace Moore, MD ? ? ? ? ? ?Past Medical History:  ?Diagnosis Date  ? Anemia   ? iron deficiency anemia   ? CAD S/P percutaneous coronary angioplasty 08/2011  ? Promus DES - mid LAD 3.5 mm x 24 mm (3.72 mm)   ? Cancer [facility])   ? Chronic kidney disease   ? stage 4 kidney disease per pt dx 02/2019  ? Complication of anesthesia   ? Dyslipidemia, goal LDL below 70   ?  on statin, close to goal  ? Family history of breast cancer   ? Family history of skin cancer   ? Former moderate cigarette smoker (10-19 per day)   ? Glucose intolerance (impaired glucose tolerance)   ? H/O thyroid nodule   ? Benign  ? 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 With cardiac arrest, 100% mobility occlusion. --> Promus DES =>  EF 40 to 45% (similar to 2012) stable wall motion normality (severe HK of midapical anterior apical wall-consistent with prior infarct).  GRII DD.  Normal valves. => Stable compared to 08/30/2011.  EF was still 40 to 45%.  ? Hypertension   ? PONV (postoperative nausea and vomiting)   ? no issues with surgery on 05-11-2019  ? Rheumatoid arthritis ([place])   ? managed on humira   ? SOB (shortness of breath) on exertion   ? reports "its been that way since my heart attack " repots no recurrence of MI sx since that time   ? ?Past Surgical History:  ?Procedure Laterality Date  ? ABDOMINAL AORTAGRAM N/A 08/27/2011  ? Procedure: ABDOMINAL Maxcine Ham;  Surgeon: Troy Sine, MD;  Location: [facility] CATH LAB;  Service: Cardiovascular;  Laterality: N/A;  ? ANKLE SURGERY Right 1995  ? per pt ankle surgery here at [facility]   ? APPENDECTOMY    ? BREAST EXCISIONAL BIOPSY Left   ? BREAST LUMPECTOMY WITH RADIOACTIVE SEED AND SENTINEL LYMPH NODE BIOPSY Right 08/05/2020  ? Procedure: RIGHT BREAST LUMPECTOMY WITH  RADIOACTIVE SEED AND SENTINEL LYMPH NODE BIOPSY;  Surgeon: [name], MD;  Location: [place];  Service: General;  Laterality: Right;  ? CYSTOSCOPY WITH RETROGRADE PYELOGRAM, URETEROSCOPY AND STENT PLACEMENT Bilateral 05/11/2019  ? Procedure: CYSTOSCOPY WITH RETROGRADE PYELOGRAM,  AND STENT PLACEMENT;  Surgeon: Lucas Mallow, MD;  Location[facility];  Service: Urology;  Laterality: Bilateral;  ? CYSTOSCOPY/URETEROSCOPY/HOLMIUM LASER/STENT PLACEMENT Bilateral 05/25/2019  ? Procedure: CYSTOSCOPY BILATERAL URETEROSCOPY/HOLMIUM LASER/STENT PLACEMENT;  Surgeon: Lucas Mallow, MD;  Location: [facility];  Service: Urology;  Laterality: Bilateral;  ? LEFT HEART CATHETERIZATION WITH CORONARY ANGIOGRAM N/A 08/27/2011  ? Procedure: LEFT HEART CATHETERIZATION WITH CORONARY ANGIOGRAM;  Surgeon: Troy Sine, MD;  Location: [facility] CATH LAB;  Service: Cardiovascular;;For anterior STEMI/cardiac arrest -- 100% mid LAD occlusion  ? PERCUTANEOUS CORONARY STENT INTERVENTION (PCI-S) N/A 08/27/2011  ? Procedure: PERCUTANEOUS CORONARY STENT INTERVENTION (PCI-S);  Surgeon: Troy Sine, MD;  Location: [facility] CATH LAB;  Service: Cardiovascular;;  mid LAD PCI --> Promus Element DES 3.5 mm at 24 mm (3.72 mm)  ? TRANSTHORACIC ECHOCARDIOGRAM  08/2011  ? EF 40-45%, moderate at K. of mid and distal inferior septum and anterior apical myocardium. Grade 1 diastolic function. -- Followup echocardiogram to reassess his EF was denied by insurance company  ?  TRANSTHORACIC ECHOCARDIOGRAM  07/20/2020  ?  EF 40 to 45% (similar to 2012) stable wall motion normality (severe HK of midapical anterior apical wall-consistent with prior infarct).  GRII DD.  Normal valves. => Stable compared to 08/30/2011.  EF was still 40 to 45%.  ? ?Patient Active Problem List  ? Diagnosis Date Noted  ? Breast cancer ([place]) 10/03/2021  ? Osteoarthritis 10/03/2021  ? Osteoporosis 07/10/2021  ? Thoracic aortic atherosclerosis (HCC)-seen on CT 05/11/2021  ? COPD (chronic  obstructive pulmonary disease) with emphysema ([place]) noted on CT 05/11/2021  ? Age-related osteoporosis without current pathological fracture 02/02/2021  ? Chronic kidney disease, stage 4 (severe) ([place]) 02/02/2021  ? Insomnia 02/02/2021  ? Localized, primary osteoarthritis of hand 02/02/2021  ? Osteopenia 02/02/2021  ? Restless legs 02/02/2021  ? Rheumatoid arthritis ([place]) 02/02/2021  ? Sciatica 02/02/2021  ? Vitamin D deficiency 02/02/2021  ? Genetic testing 07/15/2020  ? Preoperative cardiovascular examination 07/11/2020  ? Family history of breast cancer   ? Family history of skin cancer   ? Malignant neoplasm of upper-outer quadrant of right breast in female, estrogen receptor positive ([place]) 06/15/2020  ? Pain in left knee 05/28/2019  ? Ureteral calculus 05/11/2019  ? Obesity (BMI 30-39.9) 07/18/2013  ? Essential hypertension   ? Hyperlipidemia associated with type 2 diabetes mellitus ([place]) 07/16/2013  ? Glucose intolerance (impaired glucose tolerance) 07/16/2013  ? Ventricular tachycardia, sustained; Peri-infarct.  No further episodes 08/28/2011  ? Hypokalemia 08/27/2011  ? H/O Anterior STEMI:  Proximal LAD  insertion of a 3.5x24 mm Promus element DES (08/2011) 08/27/2011  ? CAD S/P percutaneous coronary angioplasty 08/16/2011  ? ? ?REFERRING DIAG: M54.16 (ICD-10-CM) - Lumbar radiculopathy ? ?THERAPY DIAG:  ?Chronic bilateral low back pain, unspecified whether sciatica present ? ?Muscle weakness (generalized) ? ?Unsteadiness on feet ? ?PERTINENT HISTORY: Hx of breast cancer, HTN ? ?SUBJECTIVE: Pt reports that stretching and self-massage to her Lt IT band has provided some relief over the past week. However, she reports new onset of Rt elbow swelling. ? ?PAIN:  ?Are you having pain? Yes ?NPRS scale: 1/10 ?Pain location: Lt lateral hip, global knee ?PAIN TYPE: aching, dull, and sharp ?Pain description: intermittent  ?Aggravating factors: prolonged sitting >10 minutes, prolonged standing/ walking >10 minutes,  laying prone, and bending over ?Relieving factors: pain cream, heating pad, and laying on side ? ? ? ? ?OBJECTIVE:  ? *Unless otherwise noted, objective information collected previously* ?DIAGNOSTIC FINDINGS:  ?None available currently ?  ?PATIENT SURVEYS:  ?FOTO 32%, predicted 51% in 12 visits ?12/15/2021: 42% ?  ?SCREENING FOR RED FLAGS: ?Bowel or bladder incontinence: No ?Cauda equina syndrome: No ?  ?COGNITION: ?         Overall cognitive status: Within functional limits for tasks assessed              ?          ?SENSATION: ?         Light touch: Appears intact ?  ?MUSCLE LENGTH: ?Thomas test: Moderate limitation BIL ?  ?POSTURE:  ?Forward head/shoulder posture, increased lumbar lordosis ?  ?PALPATION: ?TTP to BIL Lt>Rt paraspinals/ QL ?Painful CPA's to T10-L5 ?  ?LUMBAR AROM ?  ?A/PROM AROM  ?11/22/2021 AROM ?12/06/2021  ?Flexion 75%, painful on ascension 100%, no pain  ?Extension 25%, painful 50%, no pain  ?Right lateral flexion 50% 75%  ?Left lateral flexion 50% minor pain 75% no pain  ?Right rotation 50% 50%  ?Left rotation 50% with pain 50% no pain  ? (  Blank rows = not tested) ?  ?  ?LE MMT: ?  ?MMT Right ?11/22/2021 Left ?11/22/2021 Right ?12/06/2021 Left ?12/06/2021  ?Hip flexion 4/5 3/5 4+/5 4+/5p!  ?Hip extension 3/5 3/5 4+/5 4+/5  ?Hip abduction 4/5 3/5 5/5 4+/5  ? (Blank rows = not tested) ?  ?LUMBAR SPECIAL TESTS:  ?Slump: (-) BIL ?SLR: (-) BIL ?PLE: (+)  ?Plank: (+) 4 seconds, terminated due to pain  ? ?12/20/2021: Plank test: 12.5 seconds ? ?12/22/2021: (+) ober's on Lt ?  ?FUNCTIONAL TESTS:  ?5xSTS: 23 seconds with use of hands and SPC ?Squat: 50% with SPC ? ?12/06/2021: 5xSTS: 13 seconds with use of hands ?  ?  ?  ?TODAY'S TREATMENT  ? ?[facility] Adult PT Treatment:                                                DATE: 12/29/2021 ?Therapeutic Exercise: ?Seated trunk extension with 13# cable and triceps bar 3x10 ?Seated abdominal press-down with 17# cable 3x10 ?Sidelying open books x10 BIL ?Manual  Therapy: ?Self-massage with education on how to use muscle roller x5 minutes to Lateral hip and anterior thigh ?Neuromuscular re-ed: ?N/A ?Therapeutic Activity: ?Squat with overhead reach and heel raise with two 7# cables connected to w

## 2021-12-29 ENCOUNTER — Other Ambulatory Visit: Payer: Self-pay

## 2021-12-29 ENCOUNTER — Ambulatory Visit: Payer: Medicare Other

## 2021-12-29 DIAGNOSIS — M545 Low back pain, unspecified: Secondary | ICD-10-CM

## 2021-12-29 DIAGNOSIS — M6281 Muscle weakness (generalized): Secondary | ICD-10-CM | POA: Diagnosis not present

## 2021-12-29 DIAGNOSIS — G8929 Other chronic pain: Secondary | ICD-10-CM | POA: Diagnosis not present

## 2021-12-29 DIAGNOSIS — R2681 Unsteadiness on feet: Secondary | ICD-10-CM | POA: Diagnosis not present

## 2022-01-01 ENCOUNTER — Other Ambulatory Visit: Payer: Medicare Other

## 2022-01-01 DIAGNOSIS — M5416 Radiculopathy, lumbar region: Secondary | ICD-10-CM | POA: Diagnosis not present

## 2022-01-02 ENCOUNTER — Other Ambulatory Visit: Payer: Self-pay | Admitting: Hematology and Oncology

## 2022-01-04 NOTE — Therapy (Signed)
?OUTPATIENT PHYSICAL THERAPY TREATMENT NOTE/ Progress Note ? ? ?Progress Note ?Reporting Period 11/22/2021 to 01/05/2022 ? ?See note below for Objective Data and Assessment of Progress/Goals.  ? ?  ? ?Patient Name: Samantha Mcbride ?MRN: [id number] ?DOB:11-Feb-1951, 71 y.o., female ?Today's Date: 12/06/2021 ? ?PCP: Antony Contras, MD ?REFERRING PROVIDER: Eustace Moore, MD ? ? ? ? ? ?Past Medical History:  ?Diagnosis Date  ? Anemia   ? iron deficiency anemia   ? CAD S/P percutaneous coronary angioplasty 08/2011  ? Promus DES - mid LAD 3.5 mm x 24 mm (3.72 mm)   ? Cancer [facility])   ? Chronic kidney disease   ? stage 4 kidney disease per pt dx 02/2019  ? Complication of anesthesia   ? Dyslipidemia, goal LDL below 70   ?  on statin, close to goal  ? Family history of breast cancer   ? Family history of skin cancer   ? Former moderate cigarette smoker (10-19 per day)   ? Glucose intolerance (impaired glucose tolerance)   ? H/O thyroid nodule   ? Benign  ? 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 With cardiac arrest, 100% mobility occlusion. --> Promus DES =>  EF 40 to 45% (similar to 2012) stable wall motion normality (severe HK of midapical anterior apical wall-consistent with prior infarct).  GRII DD.  Normal valves. => Stable compared to 08/30/2011.  EF was still 40 to 45%.  ? Hypertension   ? PONV (postoperative nausea and vomiting)   ? no issues with surgery on 05-11-2019  ? Rheumatoid arthritis ([place])   ? managed on humira   ? SOB (shortness of breath) on exertion   ? reports "its been that way since my heart attack " repots no recurrence of MI sx since that time   ? ?Past Surgical History:  ?Procedure Laterality Date  ? ABDOMINAL AORTAGRAM N/A 08/27/2011  ? Procedure: ABDOMINAL Maxcine Ham;  Surgeon: Troy Sine, MD;  Location: [facility] CATH LAB;  Service: Cardiovascular;  Laterality: N/A;  ? ANKLE SURGERY Right 1995  ? per pt ankle surgery here at [facility]   ? APPENDECTOMY    ? BREAST EXCISIONAL  BIOPSY Left   ? BREAST LUMPECTOMY WITH RADIOACTIVE SEED AND SENTINEL LYMPH NODE BIOPSY Right 08/05/2020  ? Procedure: RIGHT BREAST LUMPECTOMY WITH RADIOACTIVE SEED AND SENTINEL LYMPH NODE BIOPSY;  Surgeon: [name], MD;  Location: [place];  Service: General;  Laterality: Right;  ? CYSTOSCOPY WITH RETROGRADE PYELOGRAM, URETEROSCOPY AND STENT PLACEMENT Bilateral 05/11/2019  ? Procedure: CYSTOSCOPY WITH RETROGRADE PYELOGRAM,  AND STENT PLACEMENT;  Surgeon: Lucas Mallow, MD;  [facility];  Service: Urology;  Laterality: Bilateral;  ? CYSTOSCOPY/URETEROSCOPY/HOLMIUM LASER/STENT PLACEMENT Bilateral 05/25/2019  ? Procedure: CYSTOSCOPY BILATERAL URETEROSCOPY/HOLMIUM LASER/STENT PLACEMENT;  Surgeon: Lucas Mallow, MD;  [facility];  Service: Urology;  Laterality: Bilateral;  ? LEFT HEART CATHETERIZATION WITH CORONARY ANGIOGRAM N/A 08/27/2011  ? Procedure: LEFT HEART CATHETERIZATION WITH CORONARY ANGIOGRAM;  Surgeon: Troy Sine, MD;  Location: [facility] CATH LAB;  Service: Cardiovascular;;For anterior STEMI/cardiac arrest -- 100% mid LAD occlusion  ? PERCUTANEOUS CORONARY STENT INTERVENTION (PCI-S) N/A 08/27/2011  ? Procedure: PERCUTANEOUS CORONARY STENT INTERVENTION (PCI-S);  Surgeon: Troy Sine, MD;  Location: [facility] CATH LAB;  Service: Cardiovascular;;  mid LAD PCI --> Promus Element DES 3.5 mm at 24 mm (3.72 mm)  ? TRANSTHORACIC ECHOCARDIOGRAM  08/2011  ? EF 40-45%, moderate at K. of mid and  distal inferior septum and anterior apical myocardium. Grade 1 diastolic function. -- Followup echocardiogram to reassess his EF was denied by insurance company  ? TRANSTHORACIC ECHOCARDIOGRAM  07/20/2020  ?  EF 40 to 45% (similar to 2012) stable wall motion normality (severe HK of midapical anterior apical wall-consistent with prior infarct).  GRII DD.  Normal valves. => Stable compared to 08/30/2011.  EF was still 40 to 45%.  ? ?Patient Active Problem List  ? Diagnosis Date Noted  ? Breast cancer ([place]) 10/03/2021   ? Osteoarthritis 10/03/2021  ? Osteoporosis 07/10/2021  ? Thoracic aortic atherosclerosis (HCC)-seen on CT 05/11/2021  ? COPD (chronic obstructive pulmonary disease) with emphysema ([place]) noted on CT 05/11/2021  ? Age-related osteoporosis without current pathological fracture 02/02/2021  ? Chronic kidney disease, stage 4 (severe) ([place]) 02/02/2021  ? Insomnia 02/02/2021  ? Localized, primary osteoarthritis of hand 02/02/2021  ? Osteopenia 02/02/2021  ? Restless legs 02/02/2021  ? Rheumatoid arthritis ([place]) 02/02/2021  ? Sciatica 02/02/2021  ? Vitamin D deficiency 02/02/2021  ? Genetic testing 07/15/2020  ? Preoperative cardiovascular examination 07/11/2020  ? Family history of breast cancer   ? Family history of skin cancer   ? Malignant neoplasm of upper-outer quadrant of right breast in female, estrogen receptor positive ([place]) 06/15/2020  ? Pain in left knee 05/28/2019  ? Ureteral calculus 05/11/2019  ? Obesity (BMI 30-39.9) 07/18/2013  ? Essential hypertension   ? Hyperlipidemia associated with type 2 diabetes mellitus ([place]) 07/16/2013  ? Glucose intolerance (impaired glucose tolerance) 07/16/2013  ? Ventricular tachycardia, sustained; Peri-infarct.  No further episodes 08/28/2011  ? Hypokalemia 08/27/2011  ? H/O Anterior STEMI:  Proximal LAD  insertion of a 3.5x24 mm Promus element DES (08/2011) 08/27/2011  ? CAD S/P percutaneous coronary angioplasty 08/16/2011  ? ? ?REFERRING DIAG: M54.16 (ICD-10-CM) - Lumbar radiculopathy ? ?THERAPY DIAG:  ?Chronic bilateral low back pain, unspecified whether sciatica present ? ?Muscle weakness (generalized) ? ?Unsteadiness on feet ? ?PERTINENT HISTORY: Hx of breast cancer, HTN ? ?SUBJECTIVE: Pt reports she has not spoken with her PCM regarding her Rt elbow swelling, although she plans to speak with her cancer physical therapist on Monday because she has restrictions about drawing fluids form her Rt arm due to her past hx of cancer. She reports non-adherence to her HEP since  her last visit. She also reports mild improvements in symptoms since her lumbar corticosteroid injection on Monday. ? ?PAIN:  ?Are you having pain? Yes ?NPRS scale: 0/10 ?Pain location: Lt lateral hip, global knee ?PAIN TYPE: aching, dull, and sharp ?Pain description: intermittent  ?Aggravating factors: prolonged sitting >10 minutes, prolonged standing/ walking >10 minutes, laying prone, and bending over ?Relieving factors: pain cream, heating pad, and laying on side ? ? ? ? ?OBJECTIVE:  ? *Unless otherwise noted, objective information collected previously* ?DIAGNOSTIC FINDINGS:  ?None available currently ?  ?PATIENT SURVEYS:  ?FOTO 32%, predicted 51% in 12 visits ?12/15/2021: 42% ?01/05/2022: 43% ?  ?SCREENING FOR RED FLAGS: ?Bowel or bladder incontinence: No ?Cauda equina syndrome: No ?  ?COGNITION: ?         Overall cognitive status: Within functional limits for tasks assessed              ?          ?SENSATION: ?         Light touch: Appears intact ?  ?MUSCLE LENGTH: ?Thomas test: Moderate limitation BIL ?  ?POSTURE:  ?Forward head/shoulder posture, increased lumbar lordosis ?  ?PALPATION: ?TTP  to BIL Lt>Rt paraspinals/ QL ?Painful CPA's to T10-L5 ?  ?LUMBAR AROM ?  ?A/PROM AROM  ?11/22/2021 AROM ?12/06/2021 AROM ?01/05/2022  ?Flexion 75%, painful on ascension 100%, no pain 100%  ?Extension 25%, painful 50%, no pain 50%  ?Right lateral flexion 50% 75% 100%  ?Left lateral flexion 50% minor pain 75% no pain 100%  ?Right rotation 50% 50% 80%  ?Left rotation 50% with pain 50% no pain 80%  ? (Blank rows = not tested) ?  ?  ?LE MMT: ?  ?MMT Right ?11/22/2021 Left ?11/22/2021 Right ?12/06/2021 Left ?12/06/2021  ?Hip flexion 4/5 3/5 4+/5 4+/5p!  ?Hip extension 3/5 3/5 4+/5 4+/5  ?Hip abduction 4/5 3/5 5/5 4+/5  ? (Blank rows = not tested) ?  ?LUMBAR SPECIAL TESTS:  ?Slump: (-) BIL ?SLR: (-) BIL ?PLE: (+)  ?Plank: (+) 4 seconds, terminated due to pain  ? ?12/20/2021: Plank test: 12.5 seconds ?01/05/2022: 30 seconds ? ?12/22/2021: (+)  ober's on Lt ?  ?FUNCTIONAL TESTS:  ?5xSTS: 23 seconds with use of hands and SPC ?Squat: 50% with SPC ? ?12/06/2021: 5xSTS: 13 seconds with use of hands ?01/05/2022: 5xSTS: 15 seconds with no hands ?  ?  ?

## 2022-01-05 ENCOUNTER — Other Ambulatory Visit: Payer: Self-pay

## 2022-01-05 ENCOUNTER — Ambulatory Visit: Payer: Medicare Other

## 2022-01-05 ENCOUNTER — Telehealth: Payer: Self-pay | Admitting: Hematology and Oncology

## 2022-01-05 DIAGNOSIS — M6281 Muscle weakness (generalized): Secondary | ICD-10-CM

## 2022-01-05 DIAGNOSIS — R2681 Unsteadiness on feet: Secondary | ICD-10-CM

## 2022-01-05 DIAGNOSIS — M545 Low back pain, unspecified: Secondary | ICD-10-CM

## 2022-01-05 DIAGNOSIS — G8929 Other chronic pain: Secondary | ICD-10-CM | POA: Diagnosis not present

## 2022-01-05 NOTE — Telephone Encounter (Signed)
.  Called patient to schedule appointment per 3/21 inbasket, patient is aware of date and time.   ?

## 2022-01-08 ENCOUNTER — Inpatient Hospital Stay: Payer: Medicare Other

## 2022-01-08 ENCOUNTER — Other Ambulatory Visit: Payer: Self-pay

## 2022-01-08 ENCOUNTER — Ambulatory Visit: Payer: Medicare Other | Attending: General Surgery

## 2022-01-08 VITALS — Wt 171.1 lb

## 2022-01-08 DIAGNOSIS — Z483 Aftercare following surgery for neoplasm: Secondary | ICD-10-CM | POA: Insufficient documentation

## 2022-01-08 NOTE — Therapy (Signed)
?OUTPATIENT PHYSICAL THERAPY SOZO SCREENING NOTE ? ? ?Patient Name: Samantha Mcbride ?MRN: [id number] ?DOB:1951-06-28, 71 y.o., female ?Today's Date: 01/08/2022 ? ?PCP: Antony Contras, MD ?REFERRING PROVIDER: [name], MD ? ? ? ?Past Medical History:  ?Diagnosis Date  ? Anemia   ? iron deficiency anemia   ? CAD S/P percutaneous coronary angioplasty 08/2011  ? Promus DES - mid LAD 3.5 mm x 24 mm (3.72 mm)   ? Cancer [facility])   ? Chronic kidney disease   ? stage 4 kidney disease per pt dx 02/2019  ? Complication of anesthesia   ? Dyslipidemia, goal LDL below 70   ?  on statin, close to goal  ? Family history of breast cancer   ? Family history of skin cancer   ? Former moderate cigarette smoker (10-19 per day)   ? Glucose intolerance (impaired glucose tolerance)   ? H/O thyroid nodule   ? Benign  ? 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 With cardiac arrest, 100% mobility occlusion. --> Promus DES =>  EF 40 to 45% (similar to 2012) stable wall motion normality (severe HK of midapical anterior apical wall-consistent with prior infarct).  GRII DD.  Normal valves. => Stable compared to 08/30/2011.  EF was still 40 to 45%.  ? Hypertension   ? PONV (postoperative nausea and vomiting)   ? no issues with surgery on 05-11-2019  ? Rheumatoid arthritis ([place])   ? managed on humira   ? SOB (shortness of breath) on exertion   ? reports "its been that way since my heart attack " repots no recurrence of MI sx since that time   ? ?Past Surgical History:  ?Procedure Laterality Date  ? ABDOMINAL AORTAGRAM N/A 08/27/2011  ? Procedure: ABDOMINAL Maxcine Ham;  Surgeon: Troy Sine, MD;  Location: [facility] CATH LAB;  Service: Cardiovascular;  Laterality: N/A;  ? ANKLE SURGERY Right 1995  ? per pt ankle surgery here at [facility]   ? APPENDECTOMY    ? BREAST EXCISIONAL BIOPSY Left   ? BREAST LUMPECTOMY WITH RADIOACTIVE SEED AND SENTINEL LYMPH NODE BIOPSY Right 08/05/2020  ? Procedure: RIGHT BREAST LUMPECTOMY WITH  RADIOACTIVE SEED AND SENTINEL LYMPH NODE BIOPSY;  Surgeon: [name], MD;  Location: [place];  Service: General;  Laterality: Right;  ? CYSTOSCOPY WITH RETROGRADE PYELOGRAM, URETEROSCOPY AND STENT PLACEMENT Bilateral 05/11/2019  ? Procedure: CYSTOSCOPY WITH RETROGRADE PYELOGRAM,  AND STENT PLACEMENT;  Surgeon: Lucas Mallow, MD;  [facility];  Service: Urology;  Laterality: Bilateral;  ? CYSTOSCOPY/URETEROSCOPY/HOLMIUM LASER/STENT PLACEMENT Bilateral 05/25/2019  ? Procedure: CYSTOSCOPY BILATERAL URETEROSCOPY/HOLMIUM LASER/STENT PLACEMENT;  Surgeon: Lucas Mallow, MD;  [facility];  Service: Urology;  Laterality: Bilateral;  ? LEFT HEART CATHETERIZATION WITH CORONARY ANGIOGRAM N/A 08/27/2011  ? Procedure: LEFT HEART CATHETERIZATION WITH CORONARY ANGIOGRAM;  Surgeon: Troy Sine, MD;  Location: [facility] CATH LAB;  Service: Cardiovascular;;For anterior STEMI/cardiac arrest -- 100% mid LAD occlusion  ? PERCUTANEOUS CORONARY STENT INTERVENTION (PCI-S) N/A 08/27/2011  ? Procedure: PERCUTANEOUS CORONARY STENT INTERVENTION (PCI-S);  Surgeon: Troy Sine, MD;  Location: [facility] CATH LAB;  Service: Cardiovascular;;  mid LAD PCI --> Promus Element DES 3.5 mm at 24 mm (3.72 mm)  ? TRANSTHORACIC ECHOCARDIOGRAM  08/2011  ? EF 40-45%, moderate at K. of mid and distal inferior septum and anterior apical myocardium. Grade 1 diastolic function. -- Followup echocardiogram to reassess his EF was denied by insurance company  ? TRANSTHORACIC  ECHOCARDIOGRAM  07/20/2020  ?  EF 40 to 45% (similar to 2012) stable wall motion normality (severe HK of midapical anterior apical wall-consistent with prior infarct).  GRII DD.  Normal valves. => Stable compared to 08/30/2011.  EF was still 40 to 45%.  ? ?Patient Active Problem List  ? Diagnosis Date Noted  ? Breast cancer ([place]) 10/03/2021  ? Osteoarthritis 10/03/2021  ? Osteoporosis 07/10/2021  ? Thoracic aortic atherosclerosis (HCC)-seen on CT 05/11/2021  ? COPD (chronic  obstructive pulmonary disease) with emphysema ([place]) noted on CT 05/11/2021  ? Age-related osteoporosis without current pathological fracture 02/02/2021  ? Chronic kidney disease, stage 4 (severe) ([place]) 02/02/2021  ? Insomnia 02/02/2021  ? Localized, primary osteoarthritis of hand 02/02/2021  ? Osteopenia 02/02/2021  ? Restless legs 02/02/2021  ? Rheumatoid arthritis ([place]) 02/02/2021  ? Sciatica 02/02/2021  ? Vitamin D deficiency 02/02/2021  ? Genetic testing 07/15/2020  ? Preoperative cardiovascular examination 07/11/2020  ? Family history of breast cancer   ? Family history of skin cancer   ? Malignant neoplasm of upper-outer quadrant of right breast in female, estrogen receptor positive ([place]) 06/15/2020  ? Pain in left knee 05/28/2019  ? Ureteral calculus 05/11/2019  ? Obesity (BMI 30-39.9) 07/18/2013  ? Essential hypertension   ? Hyperlipidemia associated with type 2 diabetes mellitus ([place]) 07/16/2013  ? Glucose intolerance (impaired glucose tolerance) 07/16/2013  ? Ventricular tachycardia, sustained; Peri-infarct.  No further episodes 08/28/2011  ? Hypokalemia 08/27/2011  ? H/O Anterior STEMI:  Proximal LAD  insertion of a 3.5x24 mm Promus element DES (08/2011) 08/27/2011  ? CAD S/P percutaneous coronary angioplasty 08/16/2011  ? ? ?REFERRING DIAG: right breast cancer at risk for lymphedema ? ?THERAPY DIAG:  ?Aftercare following surgery for neoplasm ? ?PERTINENT HISTORY:  ?Rt breast lumpectomy on 08/05/20 with 2 lymph nodes removed both negative.  Completed radiation.  On Anastrozole x 10/28/20 tolerating well   ?  ?   ? ? ?PRECAUTIONS: right UE Lymphedema risk, None ? ?SUBJECTIVE: Pt returns for her 3 month L-Dex screen. "I have a pocket of fluid on my Rt elbow. I was told by another PT I ned to see an orthopedist."  ? ?PAIN:  ?Are you having pain? No and just reports tenderness at Rt elbow when she WB's on it ? ?SOZO SCREENING: ?Patient was assessed today using the SOZO machine to determine the lymphedema index  score. This was compared to her baseline score. It was determined that she is within the recommended range when compared to her baseline and no further action is needed at this time. She will continue SOZO screenings. These are done every 3 months for 2 years post operatively followed by every 6 months for 2 years, and then annually. Encouraged pt to call orthopedist per her current treating PT's suggestion. Pt was pleased that new swelling at elbow is not affecting lymphatic fluid of her arm and is not resulting in subclinical lymphedema at this time. Answered her questions that yes we ideally want pts to avoid needle punctures to affected limb, however when that is the treatment for diagnosis then is okay to do. Pt verbalized understanding this and plans to call her orthopedist for consult.  ? ?Plan: Cont every 3 month L-Dex screen for up to 2 years from her SLNB (~08/05/22).  ? ? ? ?Otelia Limes, PTA ?01/08/2022, 10:26 AM ? ?  ? ?

## 2022-01-10 ENCOUNTER — Ambulatory Visit: Payer: Medicare Other

## 2022-01-10 ENCOUNTER — Other Ambulatory Visit: Payer: Self-pay

## 2022-01-10 DIAGNOSIS — M6281 Muscle weakness (generalized): Secondary | ICD-10-CM | POA: Diagnosis not present

## 2022-01-10 DIAGNOSIS — R2681 Unsteadiness on feet: Secondary | ICD-10-CM

## 2022-01-10 DIAGNOSIS — M545 Low back pain, unspecified: Secondary | ICD-10-CM | POA: Diagnosis not present

## 2022-01-10 DIAGNOSIS — G8929 Other chronic pain: Secondary | ICD-10-CM | POA: Diagnosis not present

## 2022-01-10 NOTE — Therapy (Signed)
?OUTPATIENT PHYSICAL THERAPY TREATMENT NOTE ? ?  ? ?Patient Name: Samantha Mcbride ?MRN: [id number] ?DOB:[birth year], 71 y.o., female ?Today's Date: 12/06/2021 ? ?PCP: Antony Contras, MD ?REFERRING PROVIDER: Eustace Moore, MD ? ? ? ? ? ?Past Medical History:  ?Diagnosis Date  ? Anemia   ? iron deficiency anemia   ? CAD S/P percutaneous coronary angioplasty 08/2011  ? Promus DES - mid LAD 3.5 mm x 24 mm (3.72 mm)   ? Cancer [facility])   ? Chronic kidney disease   ? stage 4 kidney disease per pt dx 02/2019  ? Complication of anesthesia   ? Dyslipidemia, goal LDL below 70   ?  on statin, close to goal  ? Family history of breast cancer   ? Family history of skin cancer   ? Former moderate cigarette smoker (10-19 per day)   ? Glucose intolerance (impaired glucose tolerance)   ? H/O thyroid nodule   ? Benign  ? 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 With cardiac arrest, 100% mobility occlusion. --> Promus DES =>  EF 40 to 45% (similar to 2012) stable wall motion normality (severe HK of midapical anterior apical wall-consistent with prior infarct).  GRII DD.  Normal valves. => Stable compared to 08/30/2011.  EF was still 40 to 45%.  ? Hypertension   ? PONV (postoperative nausea and vomiting)   ? no issues with surgery on 05-11-2019  ? Rheumatoid arthritis ([place])   ? managed on humira   ? SOB (shortness of breath) on exertion   ? reports "its been that way since my heart attack " repots no recurrence of MI sx since that time   ? ?Past Surgical History:  ?Procedure Laterality Date  ? ABDOMINAL AORTAGRAM N/A 08/27/2011  ? Procedure: ABDOMINAL Maxcine Ham;  Surgeon: Troy Sine, MD;  Location: [facility] CATH LAB;  Service: Cardiovascular;  Laterality: N/A;  ? ANKLE SURGERY Right 1995  ? per pt ankle surgery here at [facility]   ? APPENDECTOMY    ? BREAST EXCISIONAL BIOPSY Left   ? BREAST LUMPECTOMY WITH RADIOACTIVE SEED AND SENTINEL LYMPH NODE BIOPSY Right 08/05/2020  ? Procedure: RIGHT BREAST LUMPECTOMY  WITH RADIOACTIVE SEED AND SENTINEL LYMPH NODE BIOPSY;  Surgeon: [name], MD;  Location: [place];  Service: General;  Laterality: Right;  ? CYSTOSCOPY WITH RETROGRADE PYELOGRAM, URETEROSCOPY AND STENT PLACEMENT Bilateral 05/11/2019  ? Procedure: CYSTOSCOPY WITH RETROGRADE PYELOGRAM,  AND STENT PLACEMENT;  Surgeon: Lucas Mallow, MD;  [facility];  Service: Urology;  Laterality: Bilateral;  ? CYSTOSCOPY/URETEROSCOPY/HOLMIUM LASER/STENT PLACEMENT Bilateral 05/25/2019  ? Procedure: CYSTOSCOPY BILATERAL URETEROSCOPY/HOLMIUM LASER/STENT PLACEMENT;  Surgeon: Lucas Mallow, MD;  [facility];  Service: Urology;  Laterality: Bilateral;  ? LEFT HEART CATHETERIZATION WITH CORONARY ANGIOGRAM N/A 08/27/2011  ? Procedure: LEFT HEART CATHETERIZATION WITH CORONARY ANGIOGRAM;  Surgeon: Troy Sine, MD;  Location: [facility] CATH LAB;  Service: Cardiovascular;;For anterior STEMI/cardiac arrest -- 100% mid LAD occlusion  ? PERCUTANEOUS CORONARY STENT INTERVENTION (PCI-S) N/A 08/27/2011  ? Procedure: PERCUTANEOUS CORONARY STENT INTERVENTION (PCI-S);  Surgeon: Troy Sine, MD;  Location: [facility] CATH LAB;  Service: Cardiovascular;;  mid LAD PCI --> Promus Element DES 3.5 mm at 24 mm (3.72 mm)  ? TRANSTHORACIC ECHOCARDIOGRAM  08/2011  ? EF 40-45%, moderate at K. of mid and distal inferior septum and anterior apical myocardium. Grade 1 diastolic function. -- Followup echocardiogram to reassess his EF was denied by insurance company  ?  TRANSTHORACIC ECHOCARDIOGRAM  07/20/2020  ?  EF 40 to 45% (similar to 2012) stable wall motion normality (severe HK of midapical anterior apical wall-consistent with prior infarct).  GRII DD.  Normal valves. => Stable compared to 08/30/2011.  EF was still 40 to 45%.  ? ?Patient Active Problem List  ? Diagnosis Date Noted  ? Breast cancer ([place]) 10/03/2021  ? Osteoarthritis 10/03/2021  ? Osteoporosis 07/10/2021  ? Thoracic aortic atherosclerosis (HCC)-seen on CT 05/11/2021  ? COPD (chronic  obstructive pulmonary disease) with emphysema ([place]) noted on CT 05/11/2021  ? Age-related osteoporosis without current pathological fracture 02/02/2021  ? Chronic kidney disease, stage 4 (severe) ([place]) 02/02/2021  ? Insomnia 02/02/2021  ? Localized, primary osteoarthritis of hand 02/02/2021  ? Osteopenia 02/02/2021  ? Restless legs 02/02/2021  ? Rheumatoid arthritis ([place]) 02/02/2021  ? Sciatica 02/02/2021  ? Vitamin D deficiency 02/02/2021  ? Genetic testing 07/15/2020  ? Preoperative cardiovascular examination 07/11/2020  ? Family history of breast cancer   ? Family history of skin cancer   ? Malignant neoplasm of upper-outer quadrant of right breast in female, estrogen receptor positive ([place]) 06/15/2020  ? Pain in left knee 05/28/2019  ? Ureteral calculus 05/11/2019  ? Obesity (BMI 30-39.9) 07/18/2013  ? Essential hypertension   ? Hyperlipidemia associated with type 2 diabetes mellitus ([place]) 07/16/2013  ? Glucose intolerance (impaired glucose tolerance) 07/16/2013  ? Ventricular tachycardia, sustained; Peri-infarct.  No further episodes 08/28/2011  ? Hypokalemia 08/27/2011  ? H/O Anterior STEMI:  Proximal LAD  insertion of a 3.5x24 mm Promus element DES (08/2011) 08/27/2011  ? CAD S/P percutaneous coronary angioplasty 08/16/2011  ? ? ?REFERRING DIAG: M54.16 (ICD-10-CM) - Lumbar radiculopathy ? ?THERAPY DIAG:  ?Chronic bilateral low back pain, unspecified whether sciatica present ? ?Muscle weakness (generalized) ? ?Unsteadiness on feet ? ?PERTINENT HISTORY: Hx of breast cancer, HTN ? ?SUBJECTIVE: Pt reports having trouble sleeping last night due to increased Lt hip pain and anterior calf pain. She attributes 1 hour of sitting in her car yesterday to the increased pain. She reports that she took 3 baths and used an ice pack before she was able to fall asleep. She also reports that her cancer rehab appointment went well and that she was told she could receive a needle puncture to her elbow if medically necessary,  which may be the case for her swollen Rt elbow. She states she plans on calling her orthopedist to discuss this problem. She reports doing some of her home exercises, but not all. ? ?PAIN:  ?Are you having pain? Yes ?NPRS scale: 0/10 ?Pain location: Lt lateral hip, global knee ?PAIN TYPE: aching, dull, and sharp ?Pain description: intermittent  ?Aggravating factors: prolonged sitting >10 minutes, prolonged standing/ walking >10 minutes, laying prone, and bending over ?Relieving factors: pain cream, heating pad, and laying on side ? ? ? ? ?OBJECTIVE:  ? *Unless otherwise noted, objective information collected previously* ?DIAGNOSTIC FINDINGS:  ?None available currently ?  ?PATIENT SURVEYS:  ?FOTO 32%, predicted 51% in 12 visits ?12/15/2021: 42% ?01/05/2022: 43% ?  ?SCREENING FOR RED FLAGS: ?Bowel or bladder incontinence: No ?Cauda equina syndrome: No ?  ?COGNITION: ?         Overall cognitive status: Within functional limits for tasks assessed              ?          ?SENSATION: ?         Light touch: Appears intact ?  ?MUSCLE LENGTH: ?Marcello Moores test:  Moderate limitation BIL ?  ?POSTURE:  ?Forward head/shoulder posture, increased lumbar lordosis ?  ?PALPATION: ?TTP to BIL Lt>Rt paraspinals/ QL ?Painful CPA's to T10-L5 ?  ?LUMBAR AROM ?  ?A/PROM AROM  ?11/22/2021 AROM ?12/06/2021 AROM ?01/05/2022  ?Flexion 75%, painful on ascension 100%, no pain 100%  ?Extension 25%, painful 50%, no pain 50%  ?Right lateral flexion 50% 75% 100%  ?Left lateral flexion 50% minor pain 75% no pain 100%  ?Right rotation 50% 50% 80%  ?Left rotation 50% with pain 50% no pain 80%  ? (Blank rows = not tested) ?  ?  ?LE MMT: ?  ?MMT Right ?11/22/2021 Left ?11/22/2021 Right ?12/06/2021 Left ?12/06/2021  ?Hip flexion 4/5 3/5 4+/5 4+/5p!  ?Hip extension 3/5 3/5 4+/5 4+/5  ?Hip abduction 4/5 3/5 5/5 4+/5  ? (Blank rows = not tested) ?  ?LUMBAR SPECIAL TESTS:  ?Slump: (-) BIL ?SLR: (-) BIL ?PLE: (+)  ?Plank: (+) 4 seconds, terminated due to pain  ? ?12/20/2021: Plank  test: 12.5 seconds ?01/05/2022: 30 seconds ? ?12/22/2021: (+) ober's on Lt ?  ?FUNCTIONAL TESTS:  ?5xSTS: 23 seconds with use of hands and SPC ?Squat: 50% with SPC ? ?12/06/2021: 5xSTS: 13 seconds with use of hand

## 2022-01-12 ENCOUNTER — Ambulatory Visit: Payer: Medicare Other

## 2022-01-12 DIAGNOSIS — M6281 Muscle weakness (generalized): Secondary | ICD-10-CM | POA: Diagnosis not present

## 2022-01-12 DIAGNOSIS — R2681 Unsteadiness on feet: Secondary | ICD-10-CM

## 2022-01-12 DIAGNOSIS — G8929 Other chronic pain: Secondary | ICD-10-CM

## 2022-01-12 DIAGNOSIS — M545 Low back pain, unspecified: Secondary | ICD-10-CM | POA: Diagnosis not present

## 2022-01-12 NOTE — Therapy (Signed)
?OUTPATIENT PHYSICAL THERAPY TREATMENT NOTE/ RE-CERTIFICATION ? ?  ? ?Patient Name: Samantha Mcbride ?MRN: [id number] ?DOB:[birth year], 71 y.o., female ?Today's Date: 12/06/2021 ? ?PCP: Antony Contras, MD ?REFERRING PROVIDER: Eustace Moore, MD ? ? ? ? ? ?Past Medical History:  ?Diagnosis Date  ? Anemia   ? iron deficiency anemia   ? CAD S/P percutaneous coronary angioplasty 08/2011  ? Promus DES - mid LAD 3.5 mm x 24 mm (3.72 mm)   ? Cancer [facility])   ? Chronic kidney disease   ? stage 4 kidney disease per pt dx 02/2019  ? Complication of anesthesia   ? Dyslipidemia, goal LDL below 70   ?  on statin, close to goal  ? Family history of breast cancer   ? Family history of skin cancer   ? Former moderate cigarette smoker (10-19 per day)   ? Glucose intolerance (impaired glucose tolerance)   ? H/O thyroid nodule   ? Benign  ? 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 With cardiac arrest, 100% mobility occlusion. --> Promus DES =>  EF 40 to 45% (similar to 2012) stable wall motion normality (severe HK of midapical anterior apical wall-consistent with prior infarct).  GRII DD.  Normal valves. => Stable compared to 08/30/2011.  EF was still 40 to 45%.  ? Hypertension   ? PONV (postoperative nausea and vomiting)   ? no issues with surgery on 05-11-2019  ? Rheumatoid arthritis ([place])   ? managed on humira   ? SOB (shortness of breath) on exertion   ? reports "its been that way since my heart attack " repots no recurrence of MI sx since that time   ? ?Past Surgical History:  ?Procedure Laterality Date  ? ABDOMINAL AORTAGRAM N/A 08/27/2011  ? Procedure: ABDOMINAL Maxcine Ham;  Surgeon: Troy Sine, MD;  Location: [facility] CATH LAB;  Service: Cardiovascular;  Laterality: N/A;  ? ANKLE SURGERY Right 1995  ? per pt ankle surgery here at [facility]   ? APPENDECTOMY    ? BREAST EXCISIONAL BIOPSY Left   ? BREAST LUMPECTOMY WITH RADIOACTIVE SEED AND SENTINEL LYMPH NODE BIOPSY Right 08/05/2020  ? Procedure: RIGHT  BREAST LUMPECTOMY WITH RADIOACTIVE SEED AND SENTINEL LYMPH NODE BIOPSY;  Surgeon: [name], MD;  Location: [facility];  Service: General;  Laterality: Right;  ? CYSTOSCOPY WITH RETROGRADE PYELOGRAM, URETEROSCOPY AND STENT PLACEMENT Bilateral 05/11/2019  ? Procedure: CYSTOSCOPY WITH RETROGRADE PYELOGRAM,  AND STENT PLACEMENT;  Surgeon: Lucas Mallow, MD;  Location: [facility];  Service: Urology;  Laterality: Bilateral;  ? CYSTOSCOPY/URETEROSCOPY/HOLMIUM LASER/STENT PLACEMENT Bilateral 05/25/2019  ? Procedure: CYSTOSCOPY BILATERAL URETEROSCOPY/HOLMIUM LASER/STENT PLACEMENT;  Surgeon: Lucas Mallow, MD;  Location: [facility];  Service: Urology;  Laterality: Bilateral;  ? LEFT HEART CATHETERIZATION WITH CORONARY ANGIOGRAM N/A 08/27/2011  ? Procedure: LEFT HEART CATHETERIZATION WITH CORONARY ANGIOGRAM;  Surgeon: Troy Sine, MD;  Location: [facility] CATH LAB;  Service: Cardiovascular;;For anterior STEMI/cardiac arrest -- 100% mid LAD occlusion  ? PERCUTANEOUS CORONARY STENT INTERVENTION (PCI-S) N/A 08/27/2011  ? Procedure: PERCUTANEOUS CORONARY STENT INTERVENTION (PCI-S);  Surgeon: Troy Sine, MD;  Location: [facility] CATH LAB;  Service: Cardiovascular;;  mid LAD PCI --> Promus Element DES 3.5 mm at 24 mm (3.72 mm)  ? TRANSTHORACIC ECHOCARDIOGRAM  08/2011  ? EF 40-45%, moderate at K. of mid and distal inferior septum and anterior apical myocardium. Grade 1 diastolic function. -- Followup echocardiogram to reassess his EF was denied by insurance  company  ? TRANSTHORACIC ECHOCARDIOGRAM  07/20/2020  ?  EF 40 to 45% (similar to 2012) stable wall motion normality (severe HK of midapical anterior apical wall-consistent with prior infarct).  GRII DD.  Normal valves. => Stable compared to 08/30/2011.  EF was still 40 to 45%.  ? ?Patient Active Problem List  ? Diagnosis Date Noted  ? Breast cancer ([place]) 10/03/2021  ? Osteoarthritis 10/03/2021  ? Osteoporosis 07/10/2021  ? Thoracic aortic atherosclerosis (HCC)-seen on CT 05/11/2021  ?  COPD (chronic obstructive pulmonary disease) with emphysema ([place]) noted on CT 05/11/2021  ? Age-related osteoporosis without current pathological fracture 02/02/2021  ? Chronic kidney disease, stage 4 (severe) ([place]) 02/02/2021  ? Insomnia 02/02/2021  ? Localized, primary osteoarthritis of hand 02/02/2021  ? Osteopenia 02/02/2021  ? Restless legs 02/02/2021  ? Rheumatoid arthritis ([place]) 02/02/2021  ? Sciatica 02/02/2021  ? Vitamin D deficiency 02/02/2021  ? Genetic testing 07/15/2020  ? Preoperative cardiovascular examination 07/11/2020  ? Family history of breast cancer   ? Family history of skin cancer   ? Malignant neoplasm of upper-outer quadrant of right breast in female, estrogen receptor positive ([place]) 06/15/2020  ? Pain in left knee 05/28/2019  ? Ureteral calculus 05/11/2019  ? Obesity (BMI 30-39.9) 07/18/2013  ? Essential hypertension   ? Hyperlipidemia associated with type 2 diabetes mellitus ([place]) 07/16/2013  ? Glucose intolerance (impaired glucose tolerance) 07/16/2013  ? Ventricular tachycardia, sustained; Peri-infarct.  No further episodes 08/28/2011  ? Hypokalemia 08/27/2011  ? H/O Anterior STEMI:  Proximal LAD  insertion of a 3.5x24 mm Promus element DES (08/2011) 08/27/2011  ? CAD S/P percutaneous coronary angioplasty 08/16/2011  ? ? ?REFERRING DIAG: M54.16 (ICD-10-CM) - Lumbar radiculopathy ? ?THERAPY DIAG:  ?Chronic bilateral low back pain, unspecified whether sciatica present ? ?Muscle weakness (generalized) ? ?Unsteadiness on feet ? ?PERTINENT HISTORY: Hx of breast cancer, HTN ? ?SUBJECTIVE: Pt reports mild Lt hip/ thigh pain today. She reports she has an appointment on 01/25/2022 to drain her elbow swelling. She adds that she has been moderately adherent to her HEP. ? ?PAIN:  ?Are you having pain? Yes ?NPRS scale: 1/10 ?Pain location: Lt lateral hip, global knee ?PAIN TYPE: aching, dull, and sharp ?Pain description: intermittent  ?Aggravating factors: prolonged sitting >10 minutes, prolonged  standing/ walking >10 minutes, laying prone, and bending over ?Relieving factors: pain cream, heating pad, and laying on side ? ? ? ? ?OBJECTIVE:  ? *Unless otherwise noted, objective information collected previously* ?DIAGNOSTIC FINDINGS:  ?None available currently ?  ?PATIENT SURVEYS:  ?FOTO 32%, predicted 51% in 12 visits ?12/15/2021: 42% ?01/05/2022: 43% ?  ?SCREENING FOR RED FLAGS: ?Bowel or bladder incontinence: No ?Cauda equina syndrome: No ?  ?COGNITION: ?         Overall cognitive status: Within functional limits for tasks assessed              ?          ?SENSATION: ?         Light touch: Appears intact ?  ?MUSCLE LENGTH: ?Thomas test: Moderate limitation BIL ?  ?POSTURE:  ?Forward head/shoulder posture, increased lumbar lordosis ?  ?PALPATION: ?TTP to BIL Lt>Rt paraspinals/ QL ?Painful CPA's to T10-L5 ?  ?LUMBAR AROM ?  ?A/PROM AROM  ?11/22/2021 AROM ?12/06/2021 AROM ?01/05/2022  ?Flexion 75%, painful on ascension 100%, no pain 100%  ?Extension 25%, painful 50%, no pain 50%  ?Right lateral flexion 50% 75% 100%  ?Left lateral flexion 50% minor pain 75% no pain  100%  ?Right rotation 50% 50% 80%  ?Left rotation 50% with pain 50% no pain 80%  ? (Blank rows = not tested) ?  ?  ?LE MMT: ?  ?MMT Right ?11/22/2021 Left ?11/22/2021 Right ?12/06/2021 Left ?12/06/2021 Right ?01/12/2022 Left ?01/12/2022  ?Hip flexion 4/5 3/5 4+/5 4+/5p! 5/5 4+/5  ?Hip extension 3/5 3/5 4+/5 4+/5 4+/5 4+/5  ?Hip abduction 4/5 3/5 5/5 4+/5 5/5 5/5  ? (Blank rows = not tested) ?  ?LUMBAR SPECIAL TESTS:  ?Slump: (-) BIL ?SLR: (-) BIL ?PLE: (+)  ?Plank: (+) 4 seconds, terminated due to pain  ? ?12/20/2021: Plank test: 12.5 seconds ?01/05/2022: 30 seconds ? ?12/22/2021: (+) ober's on Lt ?  ?FUNCTIONAL TESTS:  ?5xSTS: 23 seconds with use of hands and SPC ?Squat: 50% with SPC ? ?12/06/2021: 5xSTS: 13 seconds with use of hands ?01/05/2022: 5xSTS: 15 seconds with no hands ?  ?  ?  ?TODAY'S TREATMENT  ? ?[facility] Adult PT Treatment:                                                 DATE: 01/13/2022 ?Therapeutic Exercise: ?Reverse crunches with UE GTB resistance 3x12 ?Standing abdominal press-down with 20# cable 3x8 ?Standing trunk rotation stretch 2x10 BIL ?Seated Pallof press

## 2022-01-17 ENCOUNTER — Telehealth: Payer: Self-pay | Admitting: Hematology and Oncology

## 2022-01-17 NOTE — Telephone Encounter (Signed)
Called to r/s per Asst director of nursing secure chat req to r/s overbooks, pt is aware ?

## 2022-01-19 ENCOUNTER — Other Ambulatory Visit: Payer: Self-pay | Admitting: *Deleted

## 2022-01-19 DIAGNOSIS — Z170 Estrogen receptor positive status [ER+]: Secondary | ICD-10-CM

## 2022-01-22 ENCOUNTER — Inpatient Hospital Stay: Payer: Medicare Other

## 2022-01-22 ENCOUNTER — Inpatient Hospital Stay: Payer: Medicare Other | Attending: Hematology and Oncology

## 2022-01-22 ENCOUNTER — Other Ambulatory Visit: Payer: Self-pay

## 2022-01-22 VITALS — BP 125/75 | HR 76 | Temp 98.2°F | Resp 18

## 2022-01-22 DIAGNOSIS — C50411 Malignant neoplasm of upper-outer quadrant of right female breast: Secondary | ICD-10-CM | POA: Insufficient documentation

## 2022-01-22 DIAGNOSIS — Z170 Estrogen receptor positive status [ER+]: Secondary | ICD-10-CM | POA: Diagnosis not present

## 2022-01-22 DIAGNOSIS — M810 Age-related osteoporosis without current pathological fracture: Secondary | ICD-10-CM | POA: Diagnosis not present

## 2022-01-22 LAB — CBC WITH DIFFERENTIAL (CANCER CENTER ONLY)
Abs Immature Granulocytes: 0.02 10*3/uL (ref 0.00–0.07)
Basophils Absolute: 0 10*3/uL (ref 0.0–0.1)
Basophils Relative: 1 %
Eosinophils Absolute: 0.2 10*3/uL (ref 0.0–0.5)
Eosinophils Relative: 2 %
HCT: 32.6 % — ABNORMAL LOW (ref 36.0–46.0)
Hemoglobin: 10.1 g/dL — ABNORMAL LOW (ref 12.0–15.0)
Immature Granulocytes: 0 %
Lymphocytes Relative: 17 %
Lymphs Abs: 1.3 10*3/uL (ref 0.7–4.0)
MCH: 29 pg (ref 26.0–34.0)
MCHC: 31 g/dL (ref 30.0–36.0)
MCV: 93.7 fL (ref 80.0–100.0)
Monocytes Absolute: 0.6 10*3/uL (ref 0.1–1.0)
Monocytes Relative: 7 %
Neutro Abs: 5.7 10*3/uL (ref 1.7–7.7)
Neutrophils Relative %: 73 %
Platelet Count: 161 10*3/uL (ref 150–400)
RBC: 3.48 MIL/uL — ABNORMAL LOW (ref 3.87–5.11)
RDW: 14.3 % (ref 11.5–15.5)
WBC Count: 7.8 10*3/uL (ref 4.0–10.5)
nRBC: 0 % (ref 0.0–0.2)

## 2022-01-22 LAB — CMP (CANCER CENTER ONLY)
ALT: 10 U/L (ref 0–44)
AST: 11 U/L — ABNORMAL LOW (ref 15–41)
Albumin: 3.9 g/dL (ref 3.5–5.0)
Alkaline Phosphatase: 70 U/L (ref 38–126)
Anion gap: 5 (ref 5–15)
BUN: 24 mg/dL — ABNORMAL HIGH (ref 8–23)
CO2: 29 mmol/L (ref 22–32)
Calcium: 9 mg/dL (ref 8.9–10.3)
Chloride: 108 mmol/L (ref 98–111)
Creatinine: 1.77 mg/dL — ABNORMAL HIGH (ref 0.44–1.00)
GFR, Estimated: 31 mL/min — ABNORMAL LOW (ref >60)
Glucose, Bld: 104 mg/dL — ABNORMAL HIGH (ref 70–99)
Potassium: 4.2 mmol/L (ref 3.5–5.1)
Sodium: 142 mmol/L (ref 135–145)
Total Bilirubin: 0.3 mg/dL (ref 0.3–1.2)
Total Protein: 6.8 g/dL (ref 6.5–8.1)

## 2022-01-22 MED ORDER — ZOLEDRONIC ACID 4 MG/5ML IV CONC
3.0000 mg | Freq: Once | INTRAVENOUS | Status: AC
  Administered 2022-01-22: 3 mg via INTRAVENOUS
  Filled 2022-01-22: qty 3.75

## 2022-01-22 MED ORDER — SODIUM CHLORIDE 0.9 % IV SOLN: Freq: Once | INTRAVENOUS | Status: AC

## 2022-01-22 NOTE — Patient Instructions (Addendum)
[facility]  Discharge Instructions: ?Thank you for choosing [facility] to provide your oncology and hematology care.  ? ?If you have a lab appointment with the [facility], please go directly to the [facility] and check in at the registration area. ?  ?Wear comfortable clothing and clothing appropriate for easy access to any Portacath or PICC line.  ? ?We strive to give you quality time with your provider. You may need to reschedule your appointment if you arrive late (15 or more minutes).  Arriving late affects you and other patients whose appointments are after yours.  Also, if you miss three or more appointments without notifying the office, you may be dismissed from the clinic at the provider?s discretion.    ?  ?For prescription refill requests, have your pharmacy contact our office and allow 72 hours for refills to be completed.   ? ?Today you received the following chemotherapy and/or immunotherapy agent: Zometa (zolderonic acid) ?  ?To help prevent nausea and vomiting after your treatment, we encourage you to take your nausea medication as directed. ? ?BELOW ARE SYMPTOMS THAT SHOULD BE REPORTED IMMEDIATELY: ?*FEVER GREATER THAN 100.4 F (38 ?C) OR HIGHER ?*CHILLS OR SWEATING ?*NAUSEA AND VOMITING THAT IS NOT CONTROLLED WITH YOUR NAUSEA MEDICATION ?*UNUSUAL SHORTNESS OF BREATH ?*UNUSUAL BRUISING OR BLEEDING ?*URINARY PROBLEMS (pain or burning when urinating, or frequent urination) ?*BOWEL PROBLEMS (unusual diarrhea, constipation, pain near the anus) ?TENDERNESS IN MOUTH AND THROAT WITH OR WITHOUT PRESENCE OF ULCERS (sore throat, sores in mouth, or a toothache) ?UNUSUAL RASH, SWELLING OR PAIN  ?UNUSUAL VAGINAL DISCHARGE OR ITCHING  ? ?Items with * indicate a potential emergency and should be followed up as soon as possible or go to the Emergency Department if any problems should occur. ? ?Please show the CHEMOTHERAPY ALERT CARD or IMMUNOTHERAPY ALERT CARD at  check-in to the Emergency Department and triage nurse. ? ?Should you have questions after your visit or need to cancel or reschedule your appointment, please contact [facility]  Dept: [phone]  and follow the prompts.  Office hours are 8:00 a.m. to 4:30 p.m. Monday - Friday. Please note that voicemails left after 4:00 p.m. may not be returned until the following business day.  We are closed weekends and major holidays. You have access to a nurse at all times for urgent questions. Please call the main number to the clinic Dept: [phone] and follow the prompts. ? ? ?For any non-urgent questions, you may also contact your provider using MyChart. We now offer e-Visits for anyone 49 and older to request care online for non-urgent symptoms. For details visit [email or web address]. ?  ?Also download the MyChart app! Go to the app store, search "MyChart", open the app, select [facility], and log in with your MyChart username and password. ? ?Due to Covid, a mask is required upon entering the hospital/clinic. If you do not have a mask, one will be given to you upon arrival. For doctor visits, patients may have 1 support person aged 54 or older with them. For treatment visits, patients cannot have anyone with them due to current Covid guidelines and our immunocompromised population.  ? ?Zoledronic Acid Injection (Hypercalcemia, Oncology) ?What is this medication? ?ZOLEDRONIC ACID (ZOE le dron ik AS id) slows calcium loss from bones. It high calcium levels in the blood from some kinds of cancer. It may be used in other people at risk for bone loss. ?This medicine may be  used for other purposes; ask your health care provider or pharmacist if you have questions. ?COMMON BRAND NAME(S): Zometa ?What should I tell my care team before I take this medication? ?They need to know if you have any of these conditions: ?cancer ?dehydration ?dental disease ?kidney disease ?liver disease ?low levels  of calcium in the blood ?lung or breathing disease (asthma) ?receiving steroids like dexamethasone or prednisone ?an unusual or allergic reaction to zoledronic acid, other medicines, foods, dyes, or preservatives ?pregnant or trying to get pregnant ?breast-feeding ?How should I use this medication? ?This drug is injected into a vein. It is given by a health care provider in a hospital or clinic setting. ?Talk to your health care provider about the use of this drug in children. Special care may be needed. ?Overdosage: If you think you have taken too much of this medicine contact a poison control center or emergency room at once. ?NOTE: This medicine is only for you. Do not share this medicine with others. ?What if I miss a dose? ?Keep appointments for follow-up doses. It is important not to miss your dose. Call your health care provider if you are unable to keep an appointment. ?What may interact with this medication? ?certain antibiotics given by injection ?NSAIDs, medicines for pain and inflammation, like ibuprofen or naproxen ?some diuretics like bumetanide, furosemide ?teriparatide ?thalidomide ?This list may not describe all possible interactions. Give your health care provider a list of all the medicines, herbs, non-prescription drugs, or dietary supplements you use. Also tell them if you smoke, drink alcohol, or use illegal drugs. Some items may interact with your medicine. ?What should I watch for while using this medication? ?Visit your health care provider for regular checks on your progress. It may be some time before you see the benefit from this drug. ?Some people who take this drug have severe bone, joint, or muscle pain. This drug may also increase your risk for jaw problems or a broken thigh bone. Tell your health care provider right away if you have severe pain in your jaw, bones, joints, or muscles. Tell you health care provider if you have any pain that does not go away or that gets worse. ?Tell  your dentist and dental surgeon that you are taking this drug. You should not have major dental surgery while on this drug. See your dentist to have a dental exam and fix any dental problems before starting this drug. Take good care of your teeth while on this drug. Make sure you see your dentist for regular follow-up appointments. ?You should make sure you get enough calcium and vitamin D while you are taking this drug. Discuss the foods you eat and the vitamins you take with your health care provider. ?Check with your health care provider if you have severe diarrhea, nausea, and vomiting, or if you sweat a lot. The loss of too much body fluid may make it dangerous for you to take this drug. ?You may need blood work done while you are taking this drug. ?Do not become pregnant while taking this drug. Women should inform their health care provider if they wish to become pregnant or think they might be pregnant. There is potential for serious harm to an unborn child. Talk to your health care provider for more information. ?What side effects may I notice from receiving this medication? ?Side effects that you should report to your doctor or health care provider as soon as possible: ?allergic reactions (skin rash, itching or hives; swelling  of the face, lips, or tongue) ?bone pain ?infection (fever, chills, cough, sore throat, pain or trouble passing urine) ?jaw pain, especially after dental work ?joint pain ?kidney injury (trouble passing urine or change in the amount of urine) ?low blood pressure (dizziness; feeling faint or lightheaded, falls; unusually weak or tired) ?low calcium levels (fast heartbeat; muscle cramps or pain; pain, tingling, or numbness in the hands or feet; seizures) ?low magnesium levels (fast, irregular heartbeat; muscle cramp or pain; muscle weakness; tremors; seizures) ?low red blood cell counts (trouble breathing; feeling faint; lightheaded, falls; unusually weak or tired) ?muscle  pain ?redness, blistering, peeling, or loosening of the skin, including inside the mouth ?severe diarrhea ?swelling of the ankles, feet, hands ?trouble breathing ?Side effects that usually do not require medical att

## 2022-01-25 DIAGNOSIS — M7021 Olecranon bursitis, right elbow: Secondary | ICD-10-CM | POA: Diagnosis not present

## 2022-01-25 DIAGNOSIS — M25521 Pain in right elbow: Secondary | ICD-10-CM | POA: Diagnosis not present

## 2022-01-25 NOTE — Therapy (Incomplete)
?OUTPATIENT PHYSICAL THERAPY TREATMENT NOTE ? ? ?Patient Name: Samantha Mcbride ?MRN: [id number] ?DOB:April 16, 1951, 71 y.o., female ?Today's Date: 12/06/2021 ? ?PCP: Antony Contras, MD ?REFERRING PROVIDER: Eustace Moore, MD ? ? ? ? ? ?Past Medical History:  ?Diagnosis Date  ? Anemia   ? iron deficiency anemia   ? CAD S/P percutaneous coronary angioplasty 08/2011  ? Promus DES - mid LAD 3.5 mm x 24 mm (3.72 mm)   ? Cancer [facility])   ? Chronic kidney disease   ? stage 4 kidney disease per pt dx 02/2019  ? Complication of anesthesia   ? Dyslipidemia, goal LDL below 70   ?  on statin, close to goal  ? Family history of breast cancer   ? Family history of skin cancer   ? Former moderate cigarette smoker (10-19 per day)   ? Glucose intolerance (impaired glucose tolerance)   ? H/O thyroid nodule   ? Benign  ? 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 With cardiac arrest, 100% mobility occlusion. --> Promus DES =>  EF 40 to 45% (similar to 2012) stable wall motion normality (severe HK of midapical anterior apical wall-consistent with prior infarct).  GRII DD.  Normal valves. => Stable compared to 08/30/2011.  EF was still 40 to 45%.  ? Hypertension   ? PONV (postoperative nausea and vomiting)   ? no issues with surgery on 05-11-2019  ? Rheumatoid arthritis ([place])   ? managed on humira   ? SOB (shortness of breath) on exertion   ? reports "its been that way since my heart attack " repots no recurrence of MI sx since that time   ? ?Past Surgical History:  ?Procedure Laterality Date  ? ABDOMINAL AORTAGRAM N/A 08/27/2011  ? Procedure: ABDOMINAL Maxcine Ham;  Surgeon: Troy Sine, MD;  Location: [facility] CATH LAB;  Service: Cardiovascular;  Laterality: N/A;  ? ANKLE SURGERY Right 1995  ? per pt ankle surgery here at [facility]   ? APPENDECTOMY    ? BREAST EXCISIONAL BIOPSY Left   ? BREAST LUMPECTOMY WITH RADIOACTIVE SEED AND SENTINEL LYMPH NODE BIOPSY Right 08/05/2020  ? Procedure: RIGHT BREAST LUMPECTOMY WITH  RADIOACTIVE SEED AND SENTINEL LYMPH NODE BIOPSY;  Surgeon: [name], MD;  Location: [place];  Service: General;  Laterality: Right;  ? CYSTOSCOPY WITH RETROGRADE PYELOGRAM, URETEROSCOPY AND STENT PLACEMENT Bilateral 05/11/2019  ? Procedure: CYSTOSCOPY WITH RETROGRADE PYELOGRAM,  AND STENT PLACEMENT;  Surgeon: Lucas Mallow, MD;  Location: [facility];  Service: Urology;  Laterality: Bilateral;  ? CYSTOSCOPY/URETEROSCOPY/HOLMIUM LASER/STENT PLACEMENT Bilateral 05/25/2019  ? Procedure: CYSTOSCOPY BILATERAL URETEROSCOPY/HOLMIUM LASER/STENT PLACEMENT;  Surgeon: Lucas Mallow, MD;  Location: [facility];  Service: Urology;  Laterality: Bilateral;  ? LEFT HEART CATHETERIZATION WITH CORONARY ANGIOGRAM N/A 08/27/2011  ? Procedure: LEFT HEART CATHETERIZATION WITH CORONARY ANGIOGRAM;  Surgeon: Troy Sine, MD;  Location: [facility] CATH LAB;  Service: Cardiovascular;;For anterior STEMI/cardiac arrest -- 100% mid LAD occlusion  ? PERCUTANEOUS CORONARY STENT INTERVENTION (PCI-S) N/A 08/27/2011  ? Procedure: PERCUTANEOUS CORONARY STENT INTERVENTION (PCI-S);  Surgeon: Troy Sine, MD;  Location: [facility] CATH LAB;  Service: Cardiovascular;;  mid LAD PCI --> Promus Element DES 3.5 mm at 24 mm (3.72 mm)  ? TRANSTHORACIC ECHOCARDIOGRAM  08/2011  ? EF 40-45%, moderate at K. of mid and distal inferior septum and anterior apical myocardium. Grade 1 diastolic function. -- Followup echocardiogram to reassess his EF was denied by insurance company  ?  TRANSTHORACIC ECHOCARDIOGRAM  07/20/2020  ?  EF 40 to 45% (similar to 2012) stable wall motion normality (severe HK of midapical anterior apical wall-consistent with prior infarct).  GRII DD.  Normal valves. => Stable compared to 08/30/2011.  EF was still 40 to 45%.  ? ?Patient Active Problem List  ? Diagnosis Date Noted  ? Breast cancer ([place]) 10/03/2021  ? Osteoarthritis 10/03/2021  ? Osteoporosis 07/10/2021  ? Thoracic aortic atherosclerosis (HCC)-seen on CT 05/11/2021  ? COPD (chronic  obstructive pulmonary disease) with emphysema ([place]) noted on CT 05/11/2021  ? Age-related osteoporosis without current pathological fracture 02/02/2021  ? Chronic kidney disease, stage 4 (severe) ([place]) 02/02/2021  ? Insomnia 02/02/2021  ? Localized, primary osteoarthritis of hand 02/02/2021  ? Osteopenia 02/02/2021  ? Restless legs 02/02/2021  ? Rheumatoid arthritis ([place]) 02/02/2021  ? Sciatica 02/02/2021  ? Vitamin D deficiency 02/02/2021  ? Genetic testing 07/15/2020  ? Preoperative cardiovascular examination 07/11/2020  ? Family history of breast cancer   ? Family history of skin cancer   ? Malignant neoplasm of upper-outer quadrant of right breast in female, estrogen receptor positive ([place]) 06/15/2020  ? Pain in left knee 05/28/2019  ? Ureteral calculus 05/11/2019  ? Obesity (BMI 30-39.9) 07/18/2013  ? Essential hypertension   ? Hyperlipidemia associated with type 2 diabetes mellitus ([place]) 07/16/2013  ? Glucose intolerance (impaired glucose tolerance) 07/16/2013  ? Ventricular tachycardia, sustained; Peri-infarct.  No further episodes 08/28/2011  ? Hypokalemia 08/27/2011  ? H/O Anterior STEMI:  Proximal LAD  insertion of a 3.5x24 mm Promus element DES (08/2011) 08/27/2011  ? CAD S/P percutaneous coronary angioplasty 08/16/2011  ? ? ?REFERRING DIAG: M54.16 (ICD-10-CM) - Lumbar radiculopathy ? ?THERAPY DIAG:  ?Chronic bilateral low back pain, unspecified whether sciatica present ? ?Muscle weakness (generalized) ? ?Unsteadiness on feet ? ?PERTINENT HISTORY: Hx of breast cancer, HTN ? ?SUBJECTIVE: *** ? ?PAIN:  ?Are you having pain? Yes ?NPRS scale: 1/10 ?Pain location: Lt lateral hip, global knee ?PAIN TYPE: aching, dull, and sharp ?Pain description: intermittent  ?Aggravating factors: prolonged sitting >10 minutes, prolonged standing/ walking >10 minutes, laying prone, and bending over ?Relieving factors: pain cream, heating pad, and laying on side ? ? ? ? ?OBJECTIVE:  ? *Unless otherwise noted, objective  information collected previously* ?DIAGNOSTIC FINDINGS:  ?None available currently ?  ?PATIENT SURVEYS:  ?FOTO 32%, predicted 51% in 12 visits ?12/15/2021: 42% ?01/05/2022: 43% ?  ?SCREENING FOR RED FLAGS: ?Bowel or bladder incontinence: No ?Cauda equina syndrome: No ?  ?COGNITION: ?         Overall cognitive status: Within functional limits for tasks assessed              ?          ?SENSATION: ?         Light touch: Appears intact ?  ?MUSCLE LENGTH: ?Thomas test: Moderate limitation BIL ?  ?POSTURE:  ?Forward head/shoulder posture, increased lumbar lordosis ?  ?PALPATION: ?TTP to BIL Lt>Rt paraspinals/ QL ?Painful CPA's to T10-L5 ?  ?LUMBAR AROM ?  ?A/PROM AROM  ?11/22/2021 AROM ?12/06/2021 AROM ?01/05/2022  ?Flexion 75%, painful on ascension 100%, no pain 100%  ?Extension 25%, painful 50%, no pain 50%  ?Right lateral flexion 50% 75% 100%  ?Left lateral flexion 50% minor pain 75% no pain 100%  ?Right rotation 50% 50% 80%  ?Left rotation 50% with pain 50% no pain 80%  ? (Blank rows = not tested) ?  ?  ?LE MMT: ?  ?MMT Right ?  11/22/2021 Left ?11/22/2021 Right ?12/06/2021 Left ?12/06/2021 Right ?01/12/2022 Left ?01/12/2022  ?Hip flexion 4/5 3/5 4+/5 4+/5p! 5/5 4+/5  ?Hip extension 3/5 3/5 4+/5 4+/5 4+/5 4+/5  ?Hip abduction 4/5 3/5 5/5 4+/5 5/5 5/5  ? (Blank rows = not tested) ?  ?LUMBAR SPECIAL TESTS:  ?Slump: (-) BIL ?SLR: (-) BIL ?PLE: (+)  ?Plank: (+) 4 seconds, terminated due to pain  ? ?12/20/2021: Plank test: 12.5 seconds ?01/05/2022: 30 seconds ? ?12/22/2021: (+) ober's on Lt ?  ?FUNCTIONAL TESTS:  ?5xSTS: 23 seconds with use of hands and SPC ?Squat: 50% with SPC ? ?12/06/2021: 5xSTS: 13 seconds with use of hands ?01/05/2022: 5xSTS: 15 seconds with no hands ?  ?  ?  ?TODAY'S TREATMENT  ? ?[facility] Adult PT Treatment:                                                DATE: 01/26/2022 ?Therapeutic Exercise: ?*** ?Manual Therapy: ?*** ?Neuromuscular re-ed: ?*** ?Therapeutic Activity: ?*** ?Modalities: ?*** ?Self Care: ?*** ? ? ?[facility] Adult  PT Treatment:                                                DATE: 01/13/2022 ?Therapeutic Exercise: ?Reverse crunches with UE GTB resistance 3x12 ?Standing abdominal press-down with 20# cable 3x8 ?Standing trunk rotation

## 2022-01-26 ENCOUNTER — Ambulatory Visit: Payer: Medicare Other

## 2022-02-01 NOTE — Therapy (Signed)
?OUTPATIENT PHYSICAL THERAPY TREATMENT NOTE ? ? ?Patient Name: Samantha Mcbride ?MRN: [id number] ?DOB:[birth year], 71 y.o., female ?Today's Date: 12/06/2021 ? ?PCP: Antony Contras, MD ?REFERRING PROVIDER: Eustace Moore, MD ? ? ? ? ? ?Past Medical History:  ?Diagnosis Date  ? Anemia   ? iron deficiency anemia   ? CAD S/P percutaneous coronary angioplasty 08/2011  ? Promus DES - mid LAD 3.5 mm x 24 mm (3.72 mm)   ? Cancer [facility])   ? Chronic kidney disease   ? stage 4 kidney disease per pt dx 02/2019  ? Complication of anesthesia   ? Dyslipidemia, goal LDL below 70   ?  on statin, close to goal  ? Family history of breast cancer   ? Family history of skin cancer   ? Former moderate cigarette smoker (10-19 per day)   ? Glucose intolerance (impaired glucose tolerance)   ? H/O thyroid nodule   ? Benign  ? 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 With cardiac arrest, 100% mobility occlusion. --> Promus DES =>  EF 40 to 45% (similar to 2012) stable wall motion normality (severe HK of midapical anterior apical wall-consistent with prior infarct).  GRII DD.  Normal valves. => Stable compared to 08/30/2011.  EF was still 40 to 45%.  ? Hypertension   ? PONV (postoperative nausea and vomiting)   ? no issues with surgery on 05-11-2019  ? Rheumatoid arthritis ([place])   ? managed on humira   ? SOB (shortness of breath) on exertion   ? reports "its been that way since my heart attack " repots no recurrence of MI sx since that time   ? ?Past Surgical History:  ?Procedure Laterality Date  ? ABDOMINAL AORTAGRAM N/A 08/27/2011  ? Procedure: ABDOMINAL Maxcine Ham;  Surgeon: Troy Sine, MD;  Location: [facility] CATH LAB;  Service: Cardiovascular;  Laterality: N/A;  ? ANKLE SURGERY Right 1995  ? per pt ankle surgery here at [facility]   ? APPENDECTOMY    ? BREAST EXCISIONAL BIOPSY Left   ? BREAST LUMPECTOMY WITH RADIOACTIVE SEED AND SENTINEL LYMPH NODE BIOPSY Right 08/05/2020  ? Procedure: RIGHT BREAST LUMPECTOMY WITH  RADIOACTIVE SEED AND SENTINEL LYMPH NODE BIOPSY;  Surgeon: [name], MD;  Location: [place];  Service: General;  Laterality: Right;  ? CYSTOSCOPY WITH RETROGRADE PYELOGRAM, URETEROSCOPY AND STENT PLACEMENT Bilateral 05/11/2019  ? Procedure: CYSTOSCOPY WITH RETROGRADE PYELOGRAM,  AND STENT PLACEMENT;  Surgeon: Lucas Mallow, MD;  Location[facility];  Service: Urology;  Laterality: Bilateral;  ? CYSTOSCOPY/URETEROSCOPY/HOLMIUM LASER/STENT PLACEMENT Bilateral 05/25/2019  ? Procedure: CYSTOSCOPY BILATERAL URETEROSCOPY/HOLMIUM LASER/STENT PLACEMENT;  Surgeon: Lucas Mallow, MD;  Location: [facility];  Service: Urology;  Laterality: Bilateral;  ? LEFT HEART CATHETERIZATION WITH CORONARY ANGIOGRAM N/A 08/27/2011  ? Procedure: LEFT HEART CATHETERIZATION WITH CORONARY ANGIOGRAM;  Surgeon: Troy Sine, MD;  Location: [facility] CATH LAB;  Service: Cardiovascular;;For anterior STEMI/cardiac arrest -- 100% mid LAD occlusion  ? PERCUTANEOUS CORONARY STENT INTERVENTION (PCI-S) N/A 08/27/2011  ? Procedure: PERCUTANEOUS CORONARY STENT INTERVENTION (PCI-S);  Surgeon: Troy Sine, MD;  Location: [facility] CATH LAB;  Service: Cardiovascular;;  mid LAD PCI --> Promus Element DES 3.5 mm at 24 mm (3.72 mm)  ? TRANSTHORACIC ECHOCARDIOGRAM  08/2011  ? EF 40-45%, moderate at K. of mid and distal inferior septum and anterior apical myocardium. Grade 1 diastolic function. -- Followup echocardiogram to reassess his EF was denied by insurance company  ?  TRANSTHORACIC ECHOCARDIOGRAM  07/20/2020  ?  EF 40 to 45% (similar to 2012) stable wall motion normality (severe HK of midapical anterior apical wall-consistent with prior infarct).  GRII DD.  Normal valves. => Stable compared to 08/30/2011.  EF was still 40 to 45%.  ? ?Patient Active Problem List  ? Diagnosis Date Noted  ? Breast cancer ([place]) 10/03/2021  ? Osteoarthritis 10/03/2021  ? Osteoporosis 07/10/2021  ? Thoracic aortic atherosclerosis (HCC)-seen on CT 05/11/2021  ? COPD (chronic  obstructive pulmonary disease) with emphysema ([place]) noted on CT 05/11/2021  ? Age-related osteoporosis without current pathological fracture 02/02/2021  ? Chronic kidney disease, stage 4 (severe) ([place]) 02/02/2021  ? Insomnia 02/02/2021  ? Localized, primary osteoarthritis of hand 02/02/2021  ? Osteopenia 02/02/2021  ? Restless legs 02/02/2021  ? Rheumatoid arthritis ([place]) 02/02/2021  ? Sciatica 02/02/2021  ? Vitamin D deficiency 02/02/2021  ? Genetic testing 07/15/2020  ? Preoperative cardiovascular examination 07/11/2020  ? Family history of breast cancer   ? Family history of skin cancer   ? Malignant neoplasm of upper-outer quadrant of right breast in female, estrogen receptor positive ([place]) 06/15/2020  ? Pain in left knee 05/28/2019  ? Ureteral calculus 05/11/2019  ? Obesity (BMI 30-39.9) 07/18/2013  ? Essential hypertension   ? Hyperlipidemia associated with type 2 diabetes mellitus ([place]) 07/16/2013  ? Glucose intolerance (impaired glucose tolerance) 07/16/2013  ? Ventricular tachycardia, sustained; Peri-infarct.  No further episodes 08/28/2011  ? Hypokalemia 08/27/2011  ? H/O Anterior STEMI:  Proximal LAD  insertion of a 3.5x24 mm Promus element DES (08/2011) 08/27/2011  ? CAD S/P percutaneous coronary angioplasty 08/16/2011  ? ? ?REFERRING DIAG: M54.16 (ICD-10-CM) - Lumbar radiculopathy ? ?THERAPY DIAG:  ?Chronic bilateral low back pain, unspecified whether sciatica present ? ?Muscle weakness (generalized) ? ?Unsteadiness on feet ? ?PERTINENT HISTORY: Hx of breast cancer, HTN ? ?SUBJECTIVE: Pt reports that she is still having Lt hip, knee, and low back pain. She also reports that she experienced another fall last week, but was able to get up and sustained no injuries. She also reports non-adherence to her HEP.  ? ?PAIN:  ?Are you having pain? Yes ?NPRS scale: 2/10 ?Pain location: Lt lateral hip, global knee ?PAIN TYPE: aching, dull, and sharp ?Pain description: intermittent  ?Aggravating factors: prolonged  sitting >10 minutes, prolonged standing/ walking >10 minutes, laying prone, and bending over ?Relieving factors: pain cream, heating pad, and laying on side ? ? ? ? ?OBJECTIVE:  ? *Unless otherwise noted, objective information collected previously* ?DIAGNOSTIC FINDINGS:  ?None available currently ?  ?PATIENT SURVEYS:  ?FOTO 32%, predicted 51% in 12 visits ?12/15/2021: 42% ?01/05/2022: 43% ?  ?SCREENING FOR RED FLAGS: ?Bowel or bladder incontinence: No ?Cauda equina syndrome: No ?  ?COGNITION: ?         Overall cognitive status: Within functional limits for tasks assessed              ?          ?SENSATION: ?         Light touch: Appears intact ?  ?MUSCLE LENGTH: ?Thomas test: Moderate limitation BIL ?  ?POSTURE:  ?Forward head/shoulder posture, increased lumbar lordosis ?  ?PALPATION: ?TTP to BIL Lt>Rt paraspinals/ QL ?Painful CPA's to T10-L5 ?  ?LUMBAR AROM ?  ?A/PROM AROM  ?11/22/2021 AROM ?12/06/2021 AROM ?01/05/2022 AROM ?02/02/2022:   ?Flexion 75%, painful on ascension 100%, no pain 100%   ?Extension 25%, painful 50%, no pain 50% 75%, mild Lt hip p!  ?  Right lateral flexion 50% 75% 100%   ?Left lateral flexion 50% minor pain 75% no pain 100%   ?Right rotation 50% 50% 80% 100%  ?Left rotation 50% with pain 50% no pain 80% 100%  ? (Blank rows = not tested) ?  ?  ?LE MMT: ?  ?MMT Right ?11/22/2021 Left ?11/22/2021 Right ?12/06/2021 Left ?12/06/2021 Right ?01/12/2022 Left ?01/12/2022  ?Hip flexion 4/5 3/5 4+/5 4+/5p! 5/5 4+/5  ?Hip extension 3/5 3/5 4+/5 4+/5 4+/5 4+/5  ?Hip abduction 4/5 3/5 5/5 4+/5 5/5 5/5  ? (Blank rows = not tested) ?  ?LUMBAR SPECIAL TESTS:  ?Slump: (-) BIL ?SLR: (-) BIL ?PLE: (+)  ?Plank: (+) 4 seconds, terminated due to pain  ? ?12/20/2021: Plank test: 12.5 seconds ?01/05/2022: 30 seconds ? ?12/22/2021: (+) ober's on Lt ?  ?FUNCTIONAL TESTS:  ?5xSTS: 23 seconds with use of hands and SPC ?Squat: 50% with SPC ? ?12/06/2021: 5xSTS: 13 seconds with use of hands ?01/05/2022: 5xSTS: 15 seconds with no hands ?02/02/2022:  13 seconds with no hands ?  ?  ?  ?TODAY'S TREATMENT  ? ?[facility] Adult PT Treatment:                                                DATE: 02/02/2022 ?Therapeutic Exercise: ?Supine bicycles 3x30 ?Standing hip extensio

## 2022-02-02 ENCOUNTER — Ambulatory Visit: Payer: Medicare Other | Attending: General Surgery

## 2022-02-02 DIAGNOSIS — M545 Low back pain, unspecified: Secondary | ICD-10-CM | POA: Insufficient documentation

## 2022-02-02 DIAGNOSIS — G8929 Other chronic pain: Secondary | ICD-10-CM | POA: Insufficient documentation

## 2022-02-02 DIAGNOSIS — R2681 Unsteadiness on feet: Secondary | ICD-10-CM | POA: Insufficient documentation

## 2022-02-02 DIAGNOSIS — M6281 Muscle weakness (generalized): Secondary | ICD-10-CM | POA: Diagnosis not present

## 2022-02-05 DIAGNOSIS — M5136 Other intervertebral disc degeneration, lumbar region: Secondary | ICD-10-CM | POA: Diagnosis not present

## 2022-02-05 DIAGNOSIS — M5416 Radiculopathy, lumbar region: Secondary | ICD-10-CM | POA: Diagnosis not present

## 2022-02-06 NOTE — Therapy (Signed)
?OUTPATIENT PHYSICAL THERAPY TREATMENT NOTE ? ? ?Patient Name: Samantha Mcbride ?MRN: [id number] ?DOB:[birth year], 71 y.o., female ?Today's Date: 12/06/2021 ? ?PCP: Antony Contras, MD ?REFERRING PROVIDER: Eustace Moore, MD ? ? ? ? ? ?Past Medical History:  ?Diagnosis Date  ? Anemia   ? iron deficiency anemia   ? CAD S/P percutaneous coronary angioplasty 08/2011  ? Promus DES - mid LAD 3.5 mm x 24 mm (3.72 mm)   ? Cancer [facility])   ? Chronic kidney disease   ? stage 4 kidney disease per pt dx 02/2019  ? Complication of anesthesia   ? Dyslipidemia, goal LDL below 70   ?  on statin, close to goal  ? Family history of breast cancer   ? Family history of skin cancer   ? Former moderate cigarette smoker (10-19 per day)   ? Glucose intolerance (impaired glucose tolerance)   ? H/O thyroid nodule   ? Benign  ? 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 With cardiac arrest, 100% mobility occlusion. --> Promus DES =>  EF 40 to 45% (similar to 2012) stable wall motion normality (severe HK of midapical anterior apical wall-consistent with prior infarct).  GRII DD.  Normal valves. => Stable compared to 08/30/2011.  EF was still 40 to 45%.  ? Hypertension   ? PONV (postoperative nausea and vomiting)   ? no issues with surgery on 05-11-2019  ? Rheumatoid arthritis ([place])   ? managed on humira   ? SOB (shortness of breath) on exertion   ? reports "its been that way since my heart attack " repots no recurrence of MI sx since that time   ? ?Past Surgical History:  ?Procedure Laterality Date  ? ABDOMINAL AORTAGRAM N/A 08/27/2011  ? Procedure: ABDOMINAL Maxcine Ham;  Surgeon: Troy Sine, MD;  Location: [facility] CATH LAB;  Service: Cardiovascular;  Laterality: N/A;  ? ANKLE SURGERY Right 1995  ? per pt ankle surgery here at [facility]   ? APPENDECTOMY    ? BREAST EXCISIONAL BIOPSY Left   ? BREAST LUMPECTOMY WITH RADIOACTIVE SEED AND SENTINEL LYMPH NODE BIOPSY Right 08/05/2020  ? Procedure: RIGHT BREAST LUMPECTOMY WITH  RADIOACTIVE SEED AND SENTINEL LYMPH NODE BIOPSY;  Surgeon: [name], MD;  Location: [place];  Service: General;  Laterality: Right;  ? CYSTOSCOPY WITH RETROGRADE PYELOGRAM, URETEROSCOPY AND STENT PLACEMENT Bilateral 05/11/2019  ? Procedure: CYSTOSCOPY WITH RETROGRADE PYELOGRAM,  AND STENT PLACEMENT;  Surgeon: Lucas Mallow, MD;  Location: [facility];  Service: Urology;  Laterality: Bilateral;  ? CYSTOSCOPY/URETEROSCOPY/HOLMIUM LASER/STENT PLACEMENT Bilateral 05/25/2019  ? Procedure: CYSTOSCOPY BILATERAL URETEROSCOPY/HOLMIUM LASER/STENT PLACEMENT;  Surgeon: Lucas Mallow, MD;  Location: [facility];  Service: Urology;  Laterality: Bilateral;  ? LEFT HEART CATHETERIZATION WITH CORONARY ANGIOGRAM N/A 08/27/2011  ? Procedure: LEFT HEART CATHETERIZATION WITH CORONARY ANGIOGRAM;  Surgeon: Troy Sine, MD;  Location: [facility] CATH LAB;  Service: Cardiovascular;;For anterior STEMI/cardiac arrest -- 100% mid LAD occlusion  ? PERCUTANEOUS CORONARY STENT INTERVENTION (PCI-S) N/A 08/27/2011  ? Procedure: PERCUTANEOUS CORONARY STENT INTERVENTION (PCI-S);  Surgeon: Troy Sine, MD;  Location: [facility] CATH LAB;  Service: Cardiovascular;;  mid LAD PCI --> Promus Element DES 3.5 mm at 24 mm (3.72 mm)  ? TRANSTHORACIC ECHOCARDIOGRAM  08/2011  ? EF 40-45%, moderate at K. of mid and distal inferior septum and anterior apical myocardium. Grade 1 diastolic function. -- Followup echocardiogram to reassess his EF was denied by insurance company  ?  TRANSTHORACIC ECHOCARDIOGRAM  07/20/2020  ?  EF 40 to 45% (similar to 2012) stable wall motion normality (severe HK of midapical anterior apical wall-consistent with prior infarct).  GRII DD.  Normal valves. => Stable compared to 08/30/2011.  EF was still 40 to 45%.  ? ?Patient Active Problem List  ? Diagnosis Date Noted  ? Breast cancer ([place]) 10/03/2021  ? Osteoarthritis 10/03/2021  ? Osteoporosis 07/10/2021  ? Thoracic aortic atherosclerosis (HCC)-seen on CT 05/11/2021  ? COPD (chronic  obstructive pulmonary disease) with emphysema ([place]) noted on CT 05/11/2021  ? Age-related osteoporosis without current pathological fracture 02/02/2021  ? Chronic kidney disease, stage 4 (severe) ([place]) 02/02/2021  ? Insomnia 02/02/2021  ? Localized, primary osteoarthritis of hand 02/02/2021  ? Osteopenia 02/02/2021  ? Restless legs 02/02/2021  ? Rheumatoid arthritis ([place]) 02/02/2021  ? Sciatica 02/02/2021  ? Vitamin D deficiency 02/02/2021  ? Genetic testing 07/15/2020  ? Preoperative cardiovascular examination 07/11/2020  ? Family history of breast cancer   ? Family history of skin cancer   ? Malignant neoplasm of upper-outer quadrant of right breast in female, estrogen receptor positive ([place]) 06/15/2020  ? Pain in left knee 05/28/2019  ? Ureteral calculus 05/11/2019  ? Obesity (BMI 30-39.9) 07/18/2013  ? Essential hypertension   ? Hyperlipidemia associated with type 2 diabetes mellitus ([place]) 07/16/2013  ? Glucose intolerance (impaired glucose tolerance) 07/16/2013  ? Ventricular tachycardia, sustained; Peri-infarct.  No further episodes 08/28/2011  ? Hypokalemia 08/27/2011  ? H/O Anterior STEMI:  Proximal LAD  insertion of a 3.5x24 mm Promus element DES (08/2011) 08/27/2011  ? CAD S/P percutaneous coronary angioplasty 08/16/2011  ? ? ?REFERRING DIAG: M54.16 (ICD-10-CM) - Lumbar radiculopathy ? ?THERAPY DIAG:  ?Chronic bilateral low back pain, unspecified whether sciatica present ? ?Muscle weakness (generalized) ? ?Unsteadiness on feet ? ?PERTINENT HISTORY: Hx of breast cancer, HTN ? ?SUBJECTIVE: Pt reports increased LBP and Lt hip/ leg pain today. She adds that she is getting an additional corticosteroid injection in her back on the 13th of next month.  ? ?PAIN:  ?Are you having pain? Yes ?NPRS scale: 4/10 ?Pain location: Lt lateral hip, global knee ?PAIN TYPE: aching, dull, and sharp ?Pain description: intermittent  ?Aggravating factors: prolonged sitting >10 minutes, prolonged standing/ walking >10 minutes,  laying prone, and bending over ?Relieving factors: pain cream, heating pad, and laying on side ? ? ? ? ?OBJECTIVE:  ? *Unless otherwise noted, objective information collected previously* ?DIAGNOSTIC FINDINGS:  ?None available currently ?  ?PATIENT SURVEYS:  ?FOTO 32%, predicted 51% in 12 visits ?12/15/2021: 42% ?01/05/2022: 43% ?  ?SCREENING FOR RED FLAGS: ?Bowel or bladder incontinence: No ?Cauda equina syndrome: No ?  ?COGNITION: ?         Overall cognitive status: Within functional limits for tasks assessed              ?          ?SENSATION: ?         Light touch: Appears intact ?  ?MUSCLE LENGTH: ?Thomas test: Moderate limitation BIL ?  ?POSTURE:  ?Forward head/shoulder posture, increased lumbar lordosis ?  ?PALPATION: ?TTP to BIL Lt>Rt paraspinals/ QL ?Painful CPA's to T10-L5 ?  ?LUMBAR AROM ?  ?A/PROM AROM  ?11/22/2021 AROM ?12/06/2021 AROM ?01/05/2022 AROM ?02/02/2022:   ?Flexion 75%, painful on ascension 100%, no pain 100%   ?Extension 25%, painful 50%, no pain 50% 75%, mild Lt hip p!  ?Right lateral flexion 50% 75% 100%   ?Left lateral flexion 50%  minor pain 75% no pain 100%   ?Right rotation 50% 50% 80% 100%  ?Left rotation 50% with pain 50% no pain 80% 100%  ? (Blank rows = not tested) ?  ?  ?LE MMT: ?  ?MMT Right ?11/22/2021 Left ?11/22/2021 Right ?12/06/2021 Left ?12/06/2021 Right ?01/12/2022 Left ?01/12/2022  ?Hip flexion 4/5 3/5 4+/5 4+/5p! 5/5 4+/5  ?Hip extension 3/5 3/5 4+/5 4+/5 4+/5 4+/5  ?Hip abduction 4/5 3/5 5/5 4+/5 5/5 5/5  ? (Blank rows = not tested) ?  ?LUMBAR SPECIAL TESTS:  ?Slump: (-) BIL ?SLR: (-) BIL ?PLE: (+)  ?Plank: (+) 4 seconds, terminated due to pain  ? ?12/20/2021: Plank test: 12.5 seconds ?01/05/2022: 30 seconds ? ?12/22/2021: (+) ober's on Lt ?  ?FUNCTIONAL TESTS:  ?5xSTS: 23 seconds with use of hands and SPC ?Squat: 50% with SPC ? ?12/06/2021: 5xSTS: 13 seconds with use of hands ?01/05/2022: 5xSTS: 15 seconds with no hands ?02/02/2022: 13 seconds with no hands ?  ?  ?  ?TODAY'S TREATMENT  ? ?[facility]  Adult PT Treatment:                                                DATE: 02/07/2022 ?Therapeutic Exercise: ?Supine piriformis stretch on Lt x2 minutes ?Standing forward lunge to 8-inch step 2x10 BIL ?Standing abdomi

## 2022-02-07 ENCOUNTER — Ambulatory Visit: Payer: Medicare Other

## 2022-02-07 DIAGNOSIS — M6281 Muscle weakness (generalized): Secondary | ICD-10-CM | POA: Diagnosis not present

## 2022-02-07 DIAGNOSIS — R2681 Unsteadiness on feet: Secondary | ICD-10-CM | POA: Diagnosis not present

## 2022-02-07 DIAGNOSIS — M545 Low back pain, unspecified: Secondary | ICD-10-CM | POA: Diagnosis not present

## 2022-02-07 DIAGNOSIS — G8929 Other chronic pain: Secondary | ICD-10-CM | POA: Diagnosis not present

## 2022-02-07 NOTE — Patient Instructions (Signed)

## 2022-02-09 ENCOUNTER — Ambulatory Visit: Payer: Medicare Other

## 2022-02-09 DIAGNOSIS — M549 Dorsalgia, unspecified: Secondary | ICD-10-CM | POA: Diagnosis not present

## 2022-02-09 DIAGNOSIS — D649 Anemia, unspecified: Secondary | ICD-10-CM | POA: Diagnosis not present

## 2022-02-09 DIAGNOSIS — M810 Age-related osteoporosis without current pathological fracture: Secondary | ICD-10-CM | POA: Diagnosis not present

## 2022-02-09 DIAGNOSIS — M79643 Pain in unspecified hand: Secondary | ICD-10-CM | POA: Diagnosis not present

## 2022-02-09 DIAGNOSIS — M5430 Sciatica, unspecified side: Secondary | ICD-10-CM | POA: Diagnosis not present

## 2022-02-09 DIAGNOSIS — M0579 Rheumatoid arthritis with rheumatoid factor of multiple sites without organ or systems involvement: Secondary | ICD-10-CM | POA: Diagnosis not present

## 2022-02-09 DIAGNOSIS — Z79899 Other long term (current) drug therapy: Secondary | ICD-10-CM | POA: Diagnosis not present

## 2022-02-09 DIAGNOSIS — M1990 Unspecified osteoarthritis, unspecified site: Secondary | ICD-10-CM | POA: Diagnosis not present

## 2022-02-09 DIAGNOSIS — N189 Chronic kidney disease, unspecified: Secondary | ICD-10-CM | POA: Diagnosis not present

## 2022-02-16 ENCOUNTER — Ambulatory Visit: Payer: Medicare Other

## 2022-02-20 NOTE — Therapy (Signed)
?OUTPATIENT PHYSICAL THERAPY TREATMENT NOTE/ DISCHARGE SUMMARY ? ? ?Patient Name: Samantha Mcbride ?MRN: [id number] ?DOB:12/19/1950, 71 y.o., female ?Today's Date: 12/06/2021 ? ?PCP: Antony Contras, MD ?REFERRING PROVIDER: Eustace Moore, MD ? ? ? ? ? ?Past Medical History:  ?Diagnosis Date  ? Anemia   ? iron deficiency anemia   ? CAD S/P percutaneous coronary angioplasty 08/2011  ? Promus DES - mid LAD 3.5 mm x 24 mm (3.72 mm)   ? Cancer [facility])   ? Chronic kidney disease   ? stage 4 kidney disease per pt dx 02/2019  ? Complication of anesthesia   ? Dyslipidemia, goal LDL below 70   ?  on statin, close to goal  ? Family history of breast cancer   ? Family history of skin cancer   ? Former moderate cigarette smoker (10-19 per day)   ? Glucose intolerance (impaired glucose tolerance)   ? H/O thyroid nodule   ? Benign  ? 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 With cardiac arrest, 100% mobility occlusion. --> Promus DES =>  EF 40 to 45% (similar to 2012) stable wall motion normality (severe HK of midapical anterior apical wall-consistent with prior infarct).  GRII DD.  Normal valves. => Stable compared to 08/30/2011.  EF was still 40 to 45%.  ? Hypertension   ? PONV (postoperative nausea and vomiting)   ? no issues with surgery on 05-11-2019  ? Rheumatoid arthritis ([place])   ? managed on humira   ? SOB (shortness of breath) on exertion   ? reports "its been that way since my heart attack " repots no recurrence of MI sx since that time   ? ?Past Surgical History:  ?Procedure Laterality Date  ? ABDOMINAL AORTAGRAM N/A 08/27/2011  ? Procedure: ABDOMINAL Maxcine Ham;  Surgeon: Troy Sine, MD;  Location: [facility] CATH LAB;  Service: Cardiovascular;  Laterality: N/A;  ? ANKLE SURGERY Right 1995  ? per pt ankle surgery here at [facility]   ? APPENDECTOMY    ? BREAST EXCISIONAL BIOPSY Left   ? BREAST LUMPECTOMY WITH RADIOACTIVE SEED AND SENTINEL LYMPH NODE BIOPSY Right 08/05/2020  ? Procedure: RIGHT  BREAST LUMPECTOMY WITH RADIOACTIVE SEED AND SENTINEL LYMPH NODE BIOPSY;  Surgeon: [name], MD;  Location: [place];  Service: General;  Laterality: Right;  ? CYSTOSCOPY WITH RETROGRADE PYELOGRAM, URETEROSCOPY AND STENT PLACEMENT Bilateral 05/11/2019  ? Procedure: CYSTOSCOPY WITH RETROGRADE PYELOGRAM,  AND STENT PLACEMENT;  Surgeon: Lucas Mallow, MD;  Location[facility];  Service: Urology;  Laterality: Bilateral;  ? CYSTOSCOPY/URETEROSCOPY/HOLMIUM LASER/STENT PLACEMENT Bilateral 05/25/2019  ? Procedure: CYSTOSCOPY BILATERAL URETEROSCOPY/HOLMIUM LASER/STENT PLACEMENT;  Surgeon: Lucas Mallow, MD;  Location: [facility];  Service: Urology;  Laterality: Bilateral;  ? LEFT HEART CATHETERIZATION WITH CORONARY ANGIOGRAM N/A 08/27/2011  ? Procedure: LEFT HEART CATHETERIZATION WITH CORONARY ANGIOGRAM;  Surgeon: Troy Sine, MD;  Location: [facility] CATH LAB;  Service: Cardiovascular;;For anterior STEMI/cardiac arrest -- 100% mid LAD occlusion  ? PERCUTANEOUS CORONARY STENT INTERVENTION (PCI-S) N/A 08/27/2011  ? Procedure: PERCUTANEOUS CORONARY STENT INTERVENTION (PCI-S);  Surgeon: Troy Sine, MD;  Location: [facility] CATH LAB;  Service: Cardiovascular;;  mid LAD PCI --> Promus Element DES 3.5 mm at 24 mm (3.72 mm)  ? TRANSTHORACIC ECHOCARDIOGRAM  08/2011  ? EF 40-45%, moderate at K. of mid and distal inferior septum and anterior apical myocardium. Grade 1 diastolic function. -- Followup echocardiogram to reassess his EF was denied by insurance company  ?  TRANSTHORACIC ECHOCARDIOGRAM  07/20/2020  ?  EF 40 to 45% (similar to 2012) stable wall motion normality (severe HK of midapical anterior apical wall-consistent with prior infarct).  GRII DD.  Normal valves. => Stable compared to 08/30/2011.  EF was still 40 to 45%.  ? ?Patient Active Problem List  ? Diagnosis Date Noted  ? Breast cancer ([place]) 10/03/2021  ? Osteoarthritis 10/03/2021  ? Osteoporosis 07/10/2021  ? Thoracic aortic atherosclerosis (HCC)-seen on CT 05/11/2021  ?  COPD (chronic obstructive pulmonary disease) with emphysema ([place]) noted on CT 05/11/2021  ? Age-related osteoporosis without current pathological fracture 02/02/2021  ? Chronic kidney disease, stage 4 (severe) ([place]) 02/02/2021  ? Insomnia 02/02/2021  ? Localized, primary osteoarthritis of hand 02/02/2021  ? Osteopenia 02/02/2021  ? Restless legs 02/02/2021  ? Rheumatoid arthritis ([place]) 02/02/2021  ? Sciatica 02/02/2021  ? Vitamin D deficiency 02/02/2021  ? Genetic testing 07/15/2020  ? Preoperative cardiovascular examination 07/11/2020  ? Family history of breast cancer   ? Family history of skin cancer   ? Malignant neoplasm of upper-outer quadrant of right breast in female, estrogen receptor positive ([place]) 06/15/2020  ? Pain in left knee 05/28/2019  ? Ureteral calculus 05/11/2019  ? Obesity (BMI 30-39.9) 07/18/2013  ? Essential hypertension   ? Hyperlipidemia associated with type 2 diabetes mellitus ([place]) 07/16/2013  ? Glucose intolerance (impaired glucose tolerance) 07/16/2013  ? Ventricular tachycardia, sustained; Peri-infarct.  No further episodes 08/28/2011  ? Hypokalemia 08/27/2011  ? H/O Anterior STEMI:  Proximal LAD  insertion of a 3.5x24 mm Promus element DES (08/2011) 08/27/2011  ? CAD S/P percutaneous coronary angioplasty 08/16/2011  ? ? ?REFERRING DIAG: M54.16 (ICD-10-CM) - Lumbar radiculopathy ? ?THERAPY DIAG:  ?Chronic bilateral low back pain, unspecified whether sciatica present ? ?Muscle weakness (generalized) ? ?Unsteadiness on feet ? ?PERTINENT HISTORY: Hx of breast cancer, HTN ? ?SUBJECTIVE: Pt reports that she can tell her hip is feeling better since her TPDN at her last session. She rates her pain as a 1/10 today, although she reports being sedentary the past week. She reports she is getting her olecranon bursitis drained tomorrow and a lumbar epidural scheduled for Friday. She reports she is ready to be discharged from PT at this time. ? ?PAIN:  ?Are you having pain? Yes ?NPRS scale:  1/10 ?Pain location: Lt lateral hip, global knee ?PAIN TYPE: aching, dull, and sharp ?Pain description: intermittent  ?Aggravating factors: prolonged sitting >10 minutes, prolonged standing/ walking >10 minutes, laying prone, and bending over ?Relieving factors: pain cream, heating pad, and laying on side ? ? ? ? ?OBJECTIVE:  ? *Unless otherwise noted, objective information collected previously* ?DIAGNOSTIC FINDINGS:  ?None available currently ?  ?PATIENT SURVEYS:  ?FOTO 32%, predicted 51% in 12 visits ?12/15/2021: 42% ?01/05/2022: 43% ?02/21/2022: 41% ?  ?SCREENING FOR RED FLAGS: ?Bowel or bladder incontinence: No ?Cauda equina syndrome: No ?  ?COGNITION: ?         Overall cognitive status: Within functional limits for tasks assessed              ?          ?SENSATION: ?         Light touch: Appears intact ?  ?MUSCLE LENGTH: ?Thomas test: Moderate limitation BIL ?  ?POSTURE:  ?Forward head/shoulder posture, increased lumbar lordosis ?  ?PALPATION: ?TTP to BIL Lt>Rt paraspinals/ QL ?Painful CPA's to T10-L5 ?  ?LUMBAR AROM ?  ?A/PROM AROM  ?11/22/2021 AROM ?12/06/2021 AROM ?01/05/2022 AROM ?02/02/2022:  AROM ?  02/21/2022:  ?Flexion 75%, painful on ascension 100%, no pain 100%    ?Extension 25%, painful 50%, no pain 50% 75%, mild Lt hip p! WNL, 3/10 Lt hip pain  ?Right lateral flexion 50% 75% 100%    ?Left lateral flexion 50% minor pain 75% no pain 100%    ?Right rotation 50% 50% 80% 100%   ?Left rotation 50% with pain 50% no pain 80% 100%   ? (Blank rows = not tested) ?  ?  ?LE MMT: ?  ?MMT Right ?11/22/2021 Left ?11/22/2021 Right ?12/06/2021 Left ?12/06/2021 Right ?01/12/2022 Left ?01/12/2022  ?Hip flexion 4/5 3/5 4+/5 4+/5p! 5/5 4+/5  ?Hip extension 3/5 3/5 4+/5 4+/5 4+/5 4+/5  ?Hip abduction 4/5 3/5 5/5 4+/5 5/5 5/5  ? (Blank rows = not tested) ?  ?LUMBAR SPECIAL TESTS:  ?Slump: (-) BIL ?SLR: (-) BIL ?PLE: (+)  ?Plank: (+) 4 seconds, terminated due to pain  ? ?12/20/2021: Plank test: 12.5 seconds ?01/05/2022: 30 seconds ? ?12/22/2021:  (+) ober's on Lt ?  ?FUNCTIONAL TESTS:  ?5xSTS: 23 seconds with use of hands and SPC ?Squat: 50% with SPC ? ?12/06/2021: 5xSTS: 13 seconds with use of hands ?01/05/2022: 5xSTS: 15 seconds with no hands ?02/02/2022: 13 s

## 2022-02-21 ENCOUNTER — Ambulatory Visit: Payer: Medicare Other | Attending: General Surgery

## 2022-02-21 DIAGNOSIS — M545 Low back pain, unspecified: Secondary | ICD-10-CM | POA: Insufficient documentation

## 2022-02-21 DIAGNOSIS — G8929 Other chronic pain: Secondary | ICD-10-CM | POA: Insufficient documentation

## 2022-02-21 DIAGNOSIS — R2681 Unsteadiness on feet: Secondary | ICD-10-CM | POA: Diagnosis not present

## 2022-02-21 DIAGNOSIS — M6281 Muscle weakness (generalized): Secondary | ICD-10-CM | POA: Diagnosis not present

## 2022-02-22 DIAGNOSIS — M7021 Olecranon bursitis, right elbow: Secondary | ICD-10-CM | POA: Diagnosis not present

## 2022-02-22 DIAGNOSIS — M25521 Pain in right elbow: Secondary | ICD-10-CM | POA: Diagnosis not present

## 2022-02-23 DIAGNOSIS — M5416 Radiculopathy, lumbar region: Secondary | ICD-10-CM | POA: Diagnosis not present

## 2022-04-09 ENCOUNTER — Ambulatory Visit: Payer: Medicare Other | Attending: General Surgery

## 2022-04-09 ENCOUNTER — Other Ambulatory Visit: Payer: Self-pay | Admitting: *Deleted

## 2022-04-09 VITALS — Wt 168.5 lb

## 2022-04-09 DIAGNOSIS — Z79899 Other long term (current) drug therapy: Secondary | ICD-10-CM | POA: Diagnosis not present

## 2022-04-09 DIAGNOSIS — H2513 Age-related nuclear cataract, bilateral: Secondary | ICD-10-CM | POA: Diagnosis not present

## 2022-04-09 DIAGNOSIS — H524 Presbyopia: Secondary | ICD-10-CM | POA: Diagnosis not present

## 2022-04-09 DIAGNOSIS — H5203 Hypermetropia, bilateral: Secondary | ICD-10-CM | POA: Diagnosis not present

## 2022-04-09 DIAGNOSIS — Z135 Encounter for screening for eye and ear disorders: Secondary | ICD-10-CM | POA: Diagnosis not present

## 2022-04-09 DIAGNOSIS — Z483 Aftercare following surgery for neoplasm: Secondary | ICD-10-CM | POA: Insufficient documentation

## 2022-04-09 DIAGNOSIS — C50411 Malignant neoplasm of upper-outer quadrant of right female breast: Secondary | ICD-10-CM

## 2022-04-09 DIAGNOSIS — H52223 Regular astigmatism, bilateral: Secondary | ICD-10-CM | POA: Diagnosis not present

## 2022-04-09 IMAGING — MG DIGITAL DIAGNOSTIC BILAT W/ TOMO W/ CAD
8 of 12 series · 8 of 32 positions shown · non-contrast
Comparison: Previous exam(s).

CLINICAL DATA: 70-year-old female status post malignant right
lumpectomy in July 2020. The patient describes intermittent
shooting pain behind the left nipple.

EXAM:
DIGITAL DIAGNOSTIC BILATERAL MAMMOGRAM WITH TOMOSYNTHESIS AND CAD;
ULTRASOUND LEFT BREAST LIMITED
TECHNIQUE: Bilateral digital diagnostic mammography and breast tomosynthesis
was performed. The images were evaluated with computer-aided
detection.; Targeted ultrasound examination of the left breast was
performed

[R CC (1 of 2)]
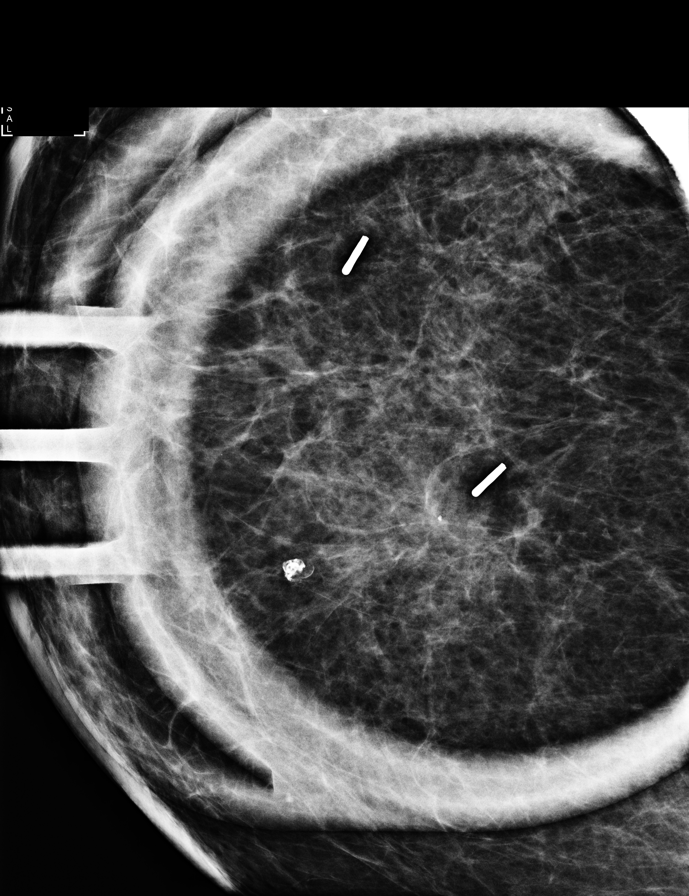

[R CC (2 of 2)]
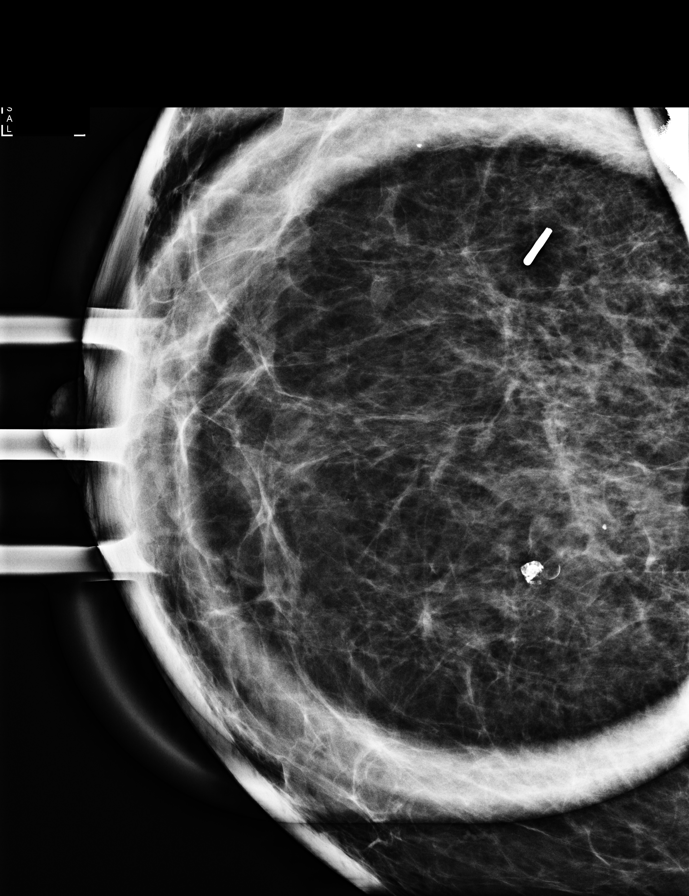

[R MLO synth-2D]
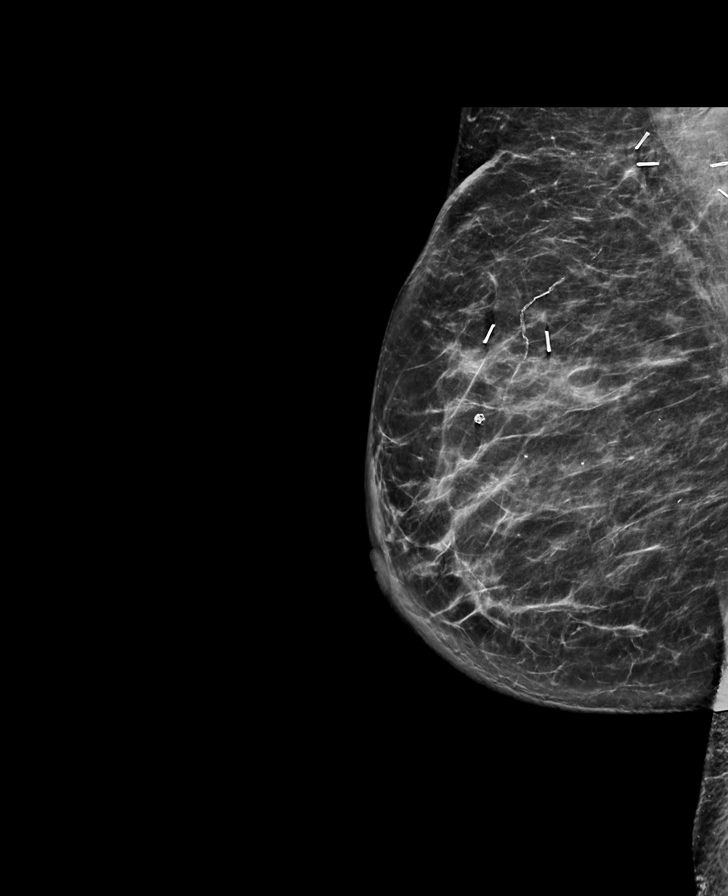

[R CC synth-2D]
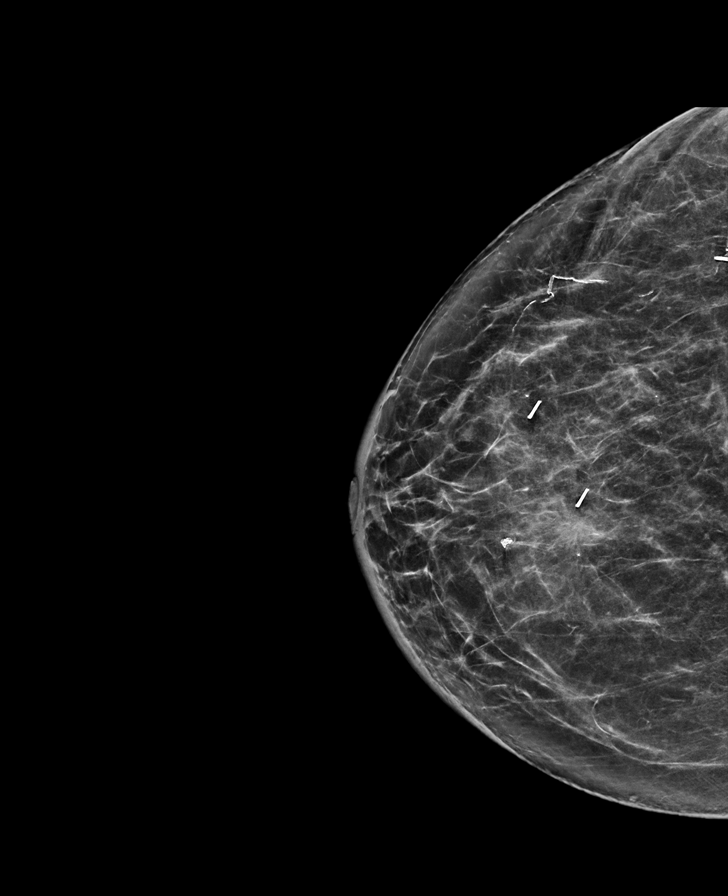

[L CC synth-2D (1 of 2)]
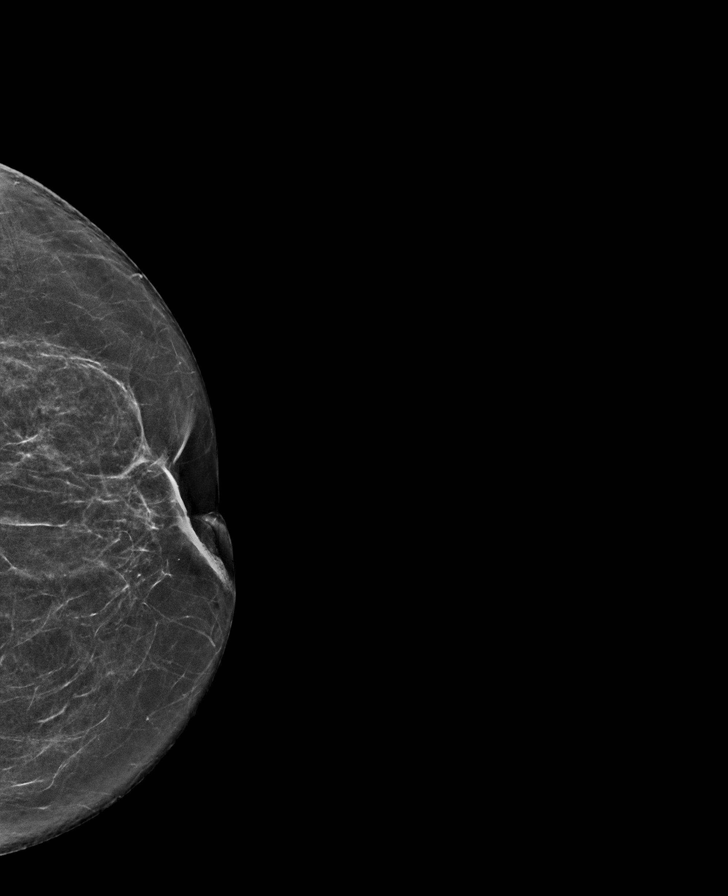

[L MLO synth-2D]
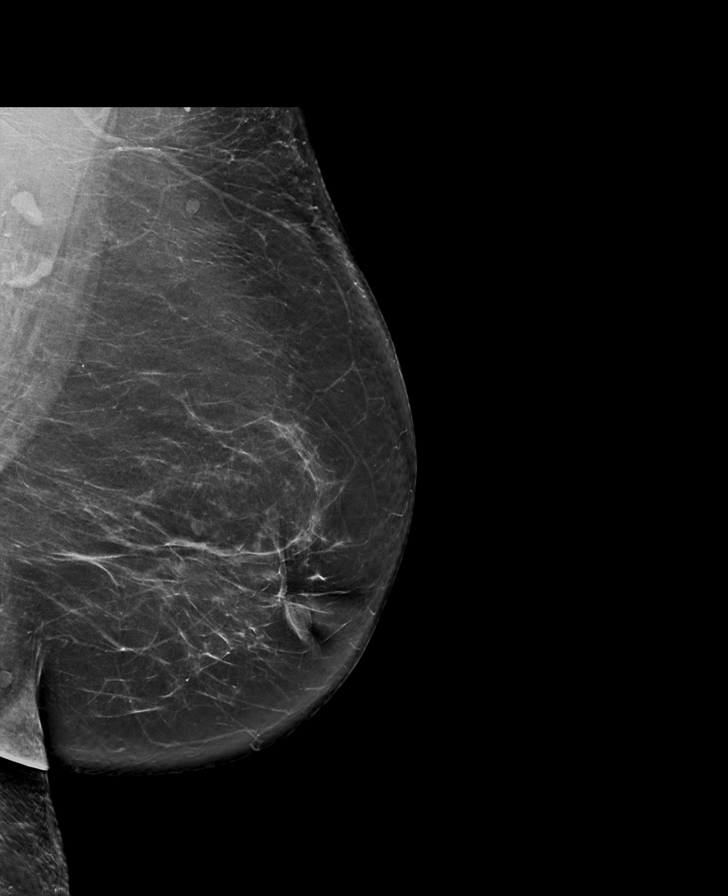

[L CC synth-2D (2 of 2)]
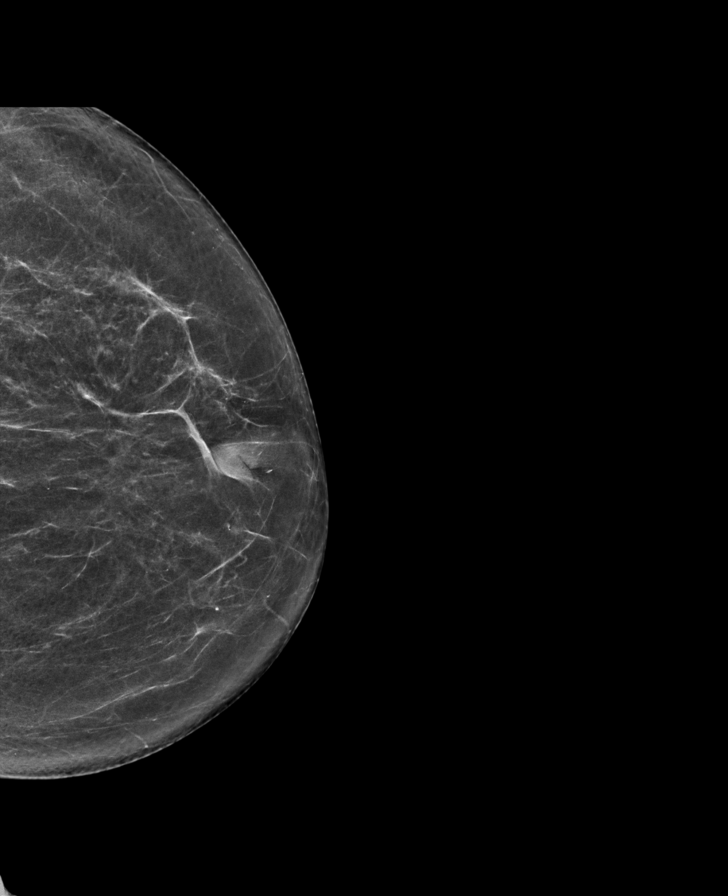

[L CC tomo · tomo slice 31/61.0]
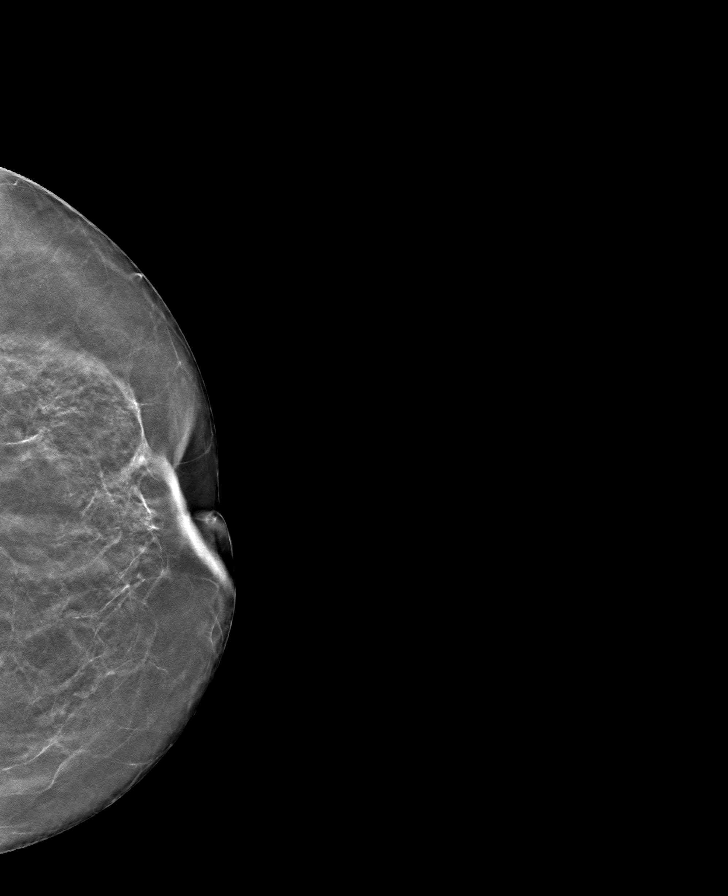

[8 of 32 positions shown; findings below may reference images not displayed]

ACR Breast Density Category b: There are scattered areas of
fibroglandular density.
FINDINGS: Interval post lumpectomy changes noted in the upper central right
breast. Otherwise, no new or suspicious findings in either breast.
The parenchymal pattern is stable.

Targeted ultrasound is performed, showing normal fibroglandular
tissue without focal or suspicious sonographic abnormality.
Evaluation of the arterial lower left breast was performed.
IMPRESSION: 1. No mammographic evidence of malignancy in either breast.
2. Interval right breast posttreatment changes.
3. Unremarkable ultrasound evaluation of the left breast.

RECOMMENDATION:
1. Clinical follow-up recommended for the intermittently painful
area of concern in the left breast. Any further workup should be
based on clinical grounds.
2.  Diagnostic mammogram is suggested in 1 year. (Code:07-0-PA1)

I have discussed the findings and recommendations with the patient.
If applicable, a reminder letter will be sent to the patient
regarding the next appointment.

BI-RADS CATEGORY  2: Benign.

## 2022-04-09 NOTE — Therapy (Signed)
OUTPATIENT PHYSICAL THERAPY SOZO SCREENING NOTE   Patient Name: Samantha Mcbride MRN: [id number] DOB:Sep 21, 1951, 71 y.o., female Today's Date: 04/09/2022  PCP: Tally Joe, MD REFERRING PROVIDER: Griselda Miner, MD   PT End of Session - 04/09/22 0931     Visit Number 15   # unchanged due to screen only   PT Start Time 0930    PT Stop Time 0935    PT Time Calculation (min) 5 min    Activity Tolerance Patient tolerated treatment well    Behavior During Therapy [facility] for tasks assessed/performed             Past Medical History:  Diagnosis Date   Anemia    iron deficiency anemia    CAD S/P percutaneous coronary angioplasty 08/2011   Promus DES - mid LAD 3.5 mm x 24 mm (3.72 mm)    Cancer (HCC)    Chronic kidney disease    stage 4 kidney disease per pt dx 02/2019   Complication of anesthesia    Dyslipidemia, goal LDL below 70     on statin, close to goal   Family history of breast cancer    Family history of skin cancer    Former moderate cigarette smoker (10-19 per day)    Glucose intolerance (impaired glucose tolerance)    H/O thyroid nodule    Benign   History 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 --> Promus DES =>  EF 40 to 45% (similar to 2012) stable wall motion normality (severe HK of midapical anterior apical wall-consistent with prior infarct).  GRII DD.  Normal valves. => Stable compared to 08/30/2011.  EF was still 40 to 45%.   Hypertension    PONV (postoperative nausea and vomiting)    no issues with surgery on 05-11-2019   Rheumatoid arthritis (HCC)    managed on humira    SOB (shortness of breath) on exertion    reports "its been that way since my heart attack " repots no recurrence of MI sx since that time    Past Surgical History:  Procedure Laterality Date   ABDOMINAL AORTAGRAM N/A 08/27/2011   Procedure: ABDOMINAL Ronny Flurry;  Surgeon: Lennette Bihari, MD;  Location: [facility]  CATH LAB;  Service: Cardiovascular;  Laterality: N/A;   ANKLE SURGERY Right 1995   per pt ankle surgery here at [facility]    APPENDECTOMY     BREAST EXCISIONAL BIOPSY Left    BREAST LUMPECTOMY WITH RADIOACTIVE SEED AND SENTINEL LYMPH NODE BIOPSY Right 08/05/2020   Procedure: RIGHT BREAST LUMPECTOMY WITH RADIOACTIVE SEED AND SENTINEL LYMPH NODE BIOPSY;  Surgeon: Griselda Miner, MD;  Location: [facility] OR;  Service: General;  Laterality: Right;   CYSTOSCOPY WITH RETROGRADE PYELOGRAM, URETEROSCOPY AND STENT PLACEMENT Bilateral 05/11/2019   Procedure: CYSTOSCOPY WITH RETROGRADE PYELOGRAM,  AND STENT PLACEMENT;  Surgeon: Crista Elliot, MD;  Location: [facility] ORS;  Service: Urology;  Laterality: Bilateral;   CYSTOSCOPY/URETEROSCOPY/HOLMIUM LASER/STENT PLACEMENT Bilateral 05/25/2019   Procedure: CYSTOSCOPY BILATERAL URETEROSCOPY/HOLMIUM LASER/STENT PLACEMENT;  Surgeon: Crista Elliot, MD;  Location: [facility] ORS;  Service: Urology;  Laterality: Bilateral;   LEFT HEART CATHETERIZATION WITH CORONARY ANGIOGRAM N/A 08/27/2011   Procedure: LEFT HEART CATHETERIZATION WITH CORONARY ANGIOGRAM;  Surgeon: Lennette Bihari, MD;  Location: [facility] CATH LAB;  Service: Cardiovascular;;For anterior STEMI/cardiac arrest -- 100% mid LAD occlusion   PERCUTANEOUS CORONARY STENT INTERVENTION (PCI-S) N/A 08/27/2011   Procedure: PERCUTANEOUS CORONARY  STENT INTERVENTION (PCI-S);  Surgeon: Lennette Bihari, MD;  Location: [facility] CATH LAB;  Service: Cardiovascular;;  mid LAD PCI --> Promus Element DES 3.5 mm at 24 mm (3.72 mm)   TRANSTHORACIC ECHOCARDIOGRAM  08/2011   EF 40-45%, moderate at K. of mid and distal inferior septum and anterior apical myocardium. Grade 1 diastolic function. -- Followup echocardiogram to reassess his EF was denied by insurance company   TRANSTHORACIC ECHOCARDIOGRAM  07/20/2020    EF 40 to 45% (similar to 2012) stable wall motion normality (severe HK of midapical anterior apical wall-consistent with prior infarct).  GRII DD.   Normal valves. => Stable compared to 08/30/2011.  EF was still 40 to 45%.   Patient Active Problem List   Diagnosis Date Noted   Breast cancer (HCC) 10/03/2021   Osteoarthritis 10/03/2021   Osteoporosis 07/10/2021   Thoracic aortic atherosclerosis (HCC)-seen on CT 05/11/2021   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 05/11/2021   Age-related osteoporosis without current pathological fracture 02/02/2021   Chronic kidney disease, stage 4 (severe) (HCC) 02/02/2021   Insomnia 02/02/2021   Localized, primary osteoarthritis of hand 02/02/2021   Osteopenia 02/02/2021   Restless legs 02/02/2021   Rheumatoid arthritis (HCC) 02/02/2021   Sciatica 02/02/2021   Vitamin D deficiency 02/02/2021   Genetic testing 07/15/2020   Preoperative cardiovascular examination 07/11/2020   Family history of breast cancer    Family history of skin cancer    Malignant neoplasm of upper-outer quadrant of right breast in female, estrogen receptor positive (HCC) 06/15/2020   Pain in left knee 05/28/2019   Ureteral calculus 05/11/2019   Obesity (BMI 30-39.9) 07/18/2013   Essential hypertension    Hyperlipidemia associated with type 2 diabetes mellitus (HCC) 07/16/2013   Glucose intolerance (impaired glucose tolerance) 07/16/2013   Ventricular tachycardia, sustained; Peri-infarct.  No further episodes 08/28/2011   Hypokalemia 08/27/2011   H/O Anterior STEMI:  Proximal LAD  insertion of a 3.5x24 mm Promus element DES (08/2011) 08/27/2011   CAD S/P percutaneous coronary angioplasty 08/16/2011    REFERRING DIAG: right breast cancer at risk for lymphedema  THERAPY DIAG:  Aftercare following surgery for neoplasm  PERTINENT HISTORY: Rt breast lumpectomy on 08/05/20 with 2 lymph nodes removed both negative.  Completed radiation.  On Anastrozole x 10/28/20 tolerating well   PRECAUTIONS: right UE Lymphedema risk, None  SUBJECTIVE: Pt returns for her 3 month L-Dex screen. "My  elbow still bothers me some times and I can feel the fluid from where the doctor had to draw it off a few times. I also feel like I fele it in my upper arm. I want to get a sleeve."  PAIN:  Are you having pain? No  SOZO SCREENING: Patient was assessed today using the SOZO machine to determine the lymphedema index score. This was compared to her baseline score. It was determined that she is within the recommended range when compared to her baseline and no further action is needed at this time. She will continue SOZO screenings. These are done every 3 months for 2 years post operatively followed by every 6 months for 2 years, and then annually. Pt does reports that her Rt elbow and upper arm still bothers her from recent fluid she had at elbow that required orthopedist drawing fluid off. Pt would benefit from a compression sleeve and instruction in self MLD. Since the patient reported a change in status to PTA, this initiated the PTA consulting with a PT. PT determined  it would be appropriate to initiate therapy at this time. PT requested a referral from patient's provider.      Hermenia Bers, PTA 04/09/2022, 9:43 AM

## 2022-04-18 ENCOUNTER — Other Ambulatory Visit: Payer: Self-pay | Admitting: Cardiology

## 2022-04-18 ENCOUNTER — Other Ambulatory Visit: Payer: Self-pay | Admitting: Podiatry

## 2022-04-23 ENCOUNTER — Encounter: Payer: Self-pay | Admitting: Physical Therapy

## 2022-04-23 ENCOUNTER — Other Ambulatory Visit: Payer: Self-pay

## 2022-04-23 ENCOUNTER — Ambulatory Visit: Payer: Medicare Other | Attending: Hematology and Oncology | Admitting: Physical Therapy

## 2022-04-23 DIAGNOSIS — M25611 Stiffness of right shoulder, not elsewhere classified: Secondary | ICD-10-CM | POA: Insufficient documentation

## 2022-04-23 DIAGNOSIS — Z170 Estrogen receptor positive status [ER+]: Secondary | ICD-10-CM | POA: Diagnosis not present

## 2022-04-23 DIAGNOSIS — R600 Localized edema: Secondary | ICD-10-CM | POA: Insufficient documentation

## 2022-04-23 DIAGNOSIS — C50411 Malignant neoplasm of upper-outer quadrant of right female breast: Secondary | ICD-10-CM | POA: Insufficient documentation

## 2022-04-23 NOTE — Therapy (Signed)
OUTPATIENT PHYSICAL THERAPY ONCOLOGY EVALUATION  Patient Name: Samantha Mcbride MRN: [id number] DOB:01/11/1951, 71 y.o., female Today's Date: 04/23/2022   PT End of Session - 04/23/22 1218     Visit Number 1    Number of Visits 9    Date for PT Re-Evaluation 05/21/22    PT Start Time 1105    PT Stop Time 1146    PT Time Calculation (min) 41 min    Activity Tolerance Patient tolerated treatment well    Behavior During Therapy [facility] for tasks assessed/performed             Past Medical History:  Diagnosis Date   Anemia    iron deficiency anemia    CAD S/P percutaneous coronary angioplasty 08/2011   Promus DES - mid LAD 3.5 mm x 24 mm (3.72 mm)    Cancer (HCC)    Chronic kidney disease    stage 4 kidney disease per pt dx 02/7321   Complication of anesthesia    Dyslipidemia, goal LDL below 70     on statin, close to goal   Family history of breast cancer    Family history of skin cancer    Former moderate cigarette smoker (10-19 per day)    Glucose intolerance (impaired glucose tolerance)    H/O thyroid nodule    Benign   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 --> Promus DES =>  EF 40 to 45% (similar to 2012) stable wall motion normality (severe HK of midapical anterior apical wall-consistent with prior infarct).  GRII DD.  Normal valves. => Stable compared to 08/30/2011.  EF was still 40 to 45%.   Hypertension    PONV (postoperative nausea and vomiting)    no issues with surgery on 05-11-2019   Rheumatoid arthritis ([place])    managed on humira    SOB (shortness of breath) on exertion    reports "its been that way since my heart attack " repots no recurrence of MI sx since that time    Past Surgical History:  Procedure Laterality Date   ABDOMINAL AORTAGRAM N/A 08/27/2011   Procedure: ABDOMINAL Maxcine Ham;  Surgeon: Troy Sine, MD;  Location: [facility] CATH LAB;  Service: Cardiovascular;   Laterality: N/A;   ANKLE SURGERY Right 1995   per pt ankle surgery here at [place] EXCISIONAL BIOPSY Left    BREAST LUMPECTOMY WITH RADIOACTIVE SEED AND SENTINEL LYMPH NODE BIOPSY Right 08/05/2020   Procedure: RIGHT BREAST LUMPECTOMY WITH RADIOACTIVE SEED AND SENTINEL LYMPH NODE BIOPSY;  Surgeon: [name], MD;  Location: [facility];  Service: General;  Laterality: Right;   CYSTOSCOPY WITH RETROGRADE PYELOGRAM, URETEROSCOPY AND STENT PLACEMENT Bilateral 05/11/2019   Procedure: CYSTOSCOPY WITH RETROGRADE PYELOGRAM,  AND STENT PLACEMENT;  Surgeon: Lucas Mallow, MD;  Location: [facility] ORS;  Service: Urology;  Laterality: Bilateral;   CYSTOSCOPY/URETEROSCOPY/HOLMIUM LASER/STENT PLACEMENT Bilateral 05/25/2019   Procedure: CYSTOSCOPY BILATERAL URETEROSCOPY/HOLMIUM LASER/STENT PLACEMENT;  Surgeon: Lucas Mallow, MD;  Location: [facility] ORS;  Service: Urology;  Laterality: Bilateral;   LEFT HEART CATHETERIZATION WITH CORONARY ANGIOGRAM N/A 08/27/2011   Procedure: LEFT HEART CATHETERIZATION WITH CORONARY ANGIOGRAM;  Surgeon: Troy Sine, MD;  Location: [facility] CATH LAB;  Service: Cardiovascular;;For anterior STEMI/cardiac arrest -- 100% mid LAD occlusion   PERCUTANEOUS CORONARY STENT INTERVENTION (PCI-S) N/A 08/27/2011   Procedure: PERCUTANEOUS CORONARY STENT INTERVENTION (PCI-S);  Surgeon:  Troy Sine, MD;  Location: [facility] CATH LAB;  Service: Cardiovascular;;  mid LAD PCI --> Promus Element DES 3.5 mm at 24 mm (3.72 mm)   TRANSTHORACIC ECHOCARDIOGRAM  08/2011   EF 40-45%, moderate at K. of mid and distal inferior septum and anterior apical myocardium. Grade 1 diastolic function. -- Followup echocardiogram to reassess his EF was denied by insurance company   TRANSTHORACIC ECHOCARDIOGRAM  07/20/2020    EF 40 to 45% (similar to 2012) stable wall motion normality (severe HK of midapical anterior apical wall-consistent with prior infarct).  GRII DD.  Normal valves. => Stable compared to  08/30/2011.  EF was still 40 to 45%.   Patient Active Problem List   Diagnosis Date Noted   Breast cancer ([place]) 10/03/2021   Osteoarthritis 10/03/2021   Osteoporosis 07/10/2021   Thoracic aortic atherosclerosis (HCC)-seen on CT 05/11/2021   COPD (chronic obstructive pulmonary disease) with emphysema ([place]) noted on CT 05/11/2021   Age-related osteoporosis without current pathological fracture 02/02/2021   Chronic kidney disease, stage 4 (severe) ([place]) 02/02/2021   Insomnia 02/02/2021   Localized, primary osteoarthritis of hand 02/02/2021   Osteopenia 02/02/2021   Restless legs 02/02/2021   Rheumatoid arthritis ([place]) 02/02/2021   Sciatica 02/02/2021   Vitamin D deficiency 02/02/2021   Genetic testing 07/15/2020   Preoperative cardiovascular examination 07/11/2020   Family history of breast cancer    Family history of skin cancer    Malignant neoplasm of upper-outer quadrant of right breast in female, estrogen receptor positive ([place]) 06/15/2020   Pain in left knee 05/28/2019   Ureteral calculus 05/11/2019   Obesity (BMI 30-39.9) 07/18/2013   Essential hypertension    Hyperlipidemia associated with type 2 diabetes mellitus ([place]) 07/16/2013   Glucose intolerance (impaired glucose tolerance) 07/16/2013   Ventricular tachycardia, sustained; Peri-infarct.  No further episodes 08/28/2011   Hypokalemia 08/27/2011   H/O Anterior STEMI:  Proximal LAD  insertion of a 3.5x24 mm Promus element DES (08/2011) 08/27/2011   CAD S/P percutaneous coronary angioplasty 08/16/2011    PCP: Antony Contras  REFERRING PROVIDER: Nicholas Lose  REFERRING DIAG: [phone] (ICD-10-CM) - Malignant neoplasm of upper-outer quadrant of right breast in female, estrogen receptor positive ([place])   THERAPY DIAG:  Localized edema - Plan: PT plan of care cert/re-cert  Stiffness of right shoulder, not elsewhere classified - Plan: PT plan of care cert/re-cert  ONSET DATE: 01/20/26  Rationale for Evaluation and  Treatment Rehabilitation  SUBJECTIVE  SUBJECTIVE STATEMENT: I have had tennis elbow and had it drained twice. I have had swelling in my elbow and arm. It feels really tight. I have been trying to do self massage. It swells worse at night time.   PERTINENT HISTORY:  Rt breast lumpectomy on 08/05/20 with 2 lymph nodes removed both negative.  Completed radiation.  On Anastrozole x 10/28/20 tolerating well  PAIN:  Are you having pain? No pt reports discomfort in R upper arm due to swelling  PRECAUTIONS: Other: at risk of lymphedema RUE  WEIGHT BEARING RESTRICTIONS No  FALLS:  Has patient fallen in last 6 months? Yes. Number of falls 1- slipper got caught on rug  LIVING ENVIRONMENT: Lives with: lives alone Lives in: House/apartment Stairs: No;  Has following equipment at home: Single point cane  OCCUPATION: retired  LEISURE: pt reports she has not been exercising due to bulging disc in her back that causes pain in her LLE  HAND DOMINANCE : right   PRIOR LEVEL OF FUNCTION: Independent  PATIENT GOALS to get swelling down   OBJECTIVE  COGNITION:  Overall cognitive status: Within functional limits for tasks assessed   PALPATION: Fullness in R upper arm, thick per palpation when compared to L side despite circumferences being within 2 cm the swelling can be easily seen  POSTURE: rounded shoulders, forward head  UPPER EXTREMITY AROM/PROM:  A/PROM RIGHT   eval   Shoulder extension 71  Shoulder flexion 150  Shoulder abduction 177  Shoulder internal rotation 60  Shoulder external rotation 77    (Blank rows = not tested)  A/PROM LEFT   eval  Shoulder extension 62  Shoulder flexion 160  Shoulder abduction 168  Shoulder internal rotation 58  Shoulder external rotation 75    (Blank rows =  not tested)    LYMPHEDEMA ASSESSMENTS:   SURGERY TYPE/DATE: Rt breast lumpectomy on 08/05/20  NUMBER OF LYMPH NODES REMOVED: 2 both negative  CHEMOTHERAPY: did not require  RADIATION:completed in Dec 2021  HORMONE TREATMENT: currently taking anastrozole  INFECTIONS: none  LYMPHEDEMA ASSESSMENTS:   LANDMARK RIGHT  eval  15 cm proximal to olecranon process 34.5  10 cm proximal to olecranon process 35.1  Olecranon process 25.9  10 cm proximal to ulnar styloid process 21.7  Just proximal to ulnar styloid process 17  Across hand at thumb web space 18.9  At base of 2nd digit 6.8  (Blank rows = not tested)  LANDMARK LEFT  eval  15 cm proximal to olecranon process 33.8  10 cm proximal to olecranon process 33  Olecranon process 26.4  10 cm proximal to ulnar styloid process 21.2  Just proximal to ulnar styloid process 16.5  Across hand at thumb web space 18.7  At base of 2nd digit 6.8  (Blank rows = not tested)   QUICK DASH SURVEY:   Katina Dung - 04/23/22 0001     Open a tight or new jar No difficulty    Do heavy household chores (wash walls, wash floors) No difficulty    Carry a shopping bag or briefcase No difficulty    Wash your back No difficulty    Use a knife to cut food No difficulty    Recreational activities in which you take some force or impact through your arm, shoulder, or hand (golf, hammering, tennis) Mild difficulty    During the past week, to what extent has your arm, shoulder or hand problem interfered with your normal social activities with family, friends, neighbors, or  groups? Not at all    During the past week, to what extent has your arm, shoulder or hand problem limited your work or other regular daily activities Not at all    Arm, shoulder, or hand pain. None    Tingling (pins and needles) in your arm, shoulder, or hand Mild    Difficulty Sleeping Mild difficulty    DASH Score 6.82 %               TODAY'S TREATMENT  04/23/22: Edema  management: measured pt for an off the shelf sleeve. She did not fit in to an off the shelf [facility] or an Exo Strong but did fit in to a size II bella strong. Will hold off on ordering this to see if her arm changes any with several MLD sessions.   PATIENT EDUCATION:  Education details: compression sleeves, flat vs circular knit and off the shelf vs custom Person educated: Patient Education method: Explanation Education comprehension: verbalized understanding   HOME EXERCISE PROGRAM: Continue to try to do self massage at home until instructed in self MLD at next appt  ASSESSMENT:  CLINICAL IMPRESSION: Patient is a 71 y.o. female who was seen today for physical therapy evaluation and treatment for R elbow and upper arm swelling. She reports she was going to PT and was doing exercises that required her to bear weight through her elbow. She developed tennis elbow and had to have the fluid drained twice. Her circumferential measurements are not greater than 2 cm difference but there is visible fullness in right Upper arm and thickness that is palpable. Pt would benefit from skilled PT services to decrease swelling and assist pt with obtaining appropriate compression garments for long term management as well as to improve R shoulder ROM.     OBJECTIVE IMPAIRMENTS decreased ROM, increased edema, and pain.   ACTIVITY LIMITATIONS carrying and lifting  PARTICIPATION LIMITATIONS:  none  PERSONAL FACTORS  none  are also affecting patient's functional outcome.   REHAB POTENTIAL: Excellent  CLINICAL DECISION MAKING: Stable/uncomplicated  EVALUATION COMPLEXITY: Low  GOALS: Goals reviewed with patient? Yes  SHORT TERM GOALS = LONG TERM GOALS  LONG TERM GOALS: Target date: 05/21/2022    Pt will obtain appropriate compression garments for long term management of edema. Baseline:  Goal status: INITIAL  2.  Pt will be independent in self MLD for long term management of edema.  Baseline:   Goal status: INITIAL  3.  Pt will demonstrate improved R shoulder flexion ROM to 160 degrees to allow her to reach overhead. Baseline: 150 Goal status: INITIAL    PLAN: PT FREQUENCY: 2x/week  PT DURATION: 4 weeks  PLANNED INTERVENTIONS: Therapeutic exercises, Patient/Family education, Orthotic/Fit training, Manual lymph drainage, Compression bandaging, Vasopneumatic device, and Manual therapy  PLAN FOR NEXT SESSION: begin MLD and instruct pt, see if she fits in to harmony after a few session, currently fits in to size 2 bella strong   [employer], PT 04/23/2022, 12:21 PM

## 2022-04-24 DIAGNOSIS — I2510 Atherosclerotic heart disease of native coronary artery without angina pectoris: Secondary | ICD-10-CM | POA: Diagnosis not present

## 2022-04-24 DIAGNOSIS — N184 Chronic kidney disease, stage 4 (severe): Secondary | ICD-10-CM | POA: Diagnosis not present

## 2022-04-24 DIAGNOSIS — I2109 ST elevation (STEMI) myocardial infarction involving other coronary artery of anterior wall: Secondary | ICD-10-CM | POA: Diagnosis not present

## 2022-04-24 DIAGNOSIS — Z9861 Coronary angioplasty status: Secondary | ICD-10-CM | POA: Diagnosis not present

## 2022-04-24 DIAGNOSIS — E1169 Type 2 diabetes mellitus with other specified complication: Secondary | ICD-10-CM | POA: Diagnosis not present

## 2022-04-25 ENCOUNTER — Other Ambulatory Visit: Payer: Self-pay | Admitting: Cardiology

## 2022-04-25 LAB — COMPREHENSIVE METABOLIC PANEL
ALT: 9 IU/L (ref 0–32)
AST: 9 IU/L (ref 0–40)
Albumin/Globulin Ratio: 1.6 (ref 1.2–2.2)
Albumin: 3.9 g/dL (ref 3.9–4.9)
Alkaline Phosphatase: 76 IU/L (ref 44–121)
BUN/Creatinine Ratio: 9 — ABNORMAL LOW (ref 12–28)
BUN: 17 mg/dL (ref 8–27)
Bilirubin Total: 0.3 mg/dL (ref 0.0–1.2)
CO2: 23 mmol/L (ref 20–29)
Calcium: 8.5 mg/dL — ABNORMAL LOW (ref 8.7–10.3)
Chloride: 106 mmol/L (ref 96–106)
Creatinine, Ser: 1.99 mg/dL — ABNORMAL HIGH (ref 0.57–1.00)
Globulin, Total: 2.4 g/dL (ref 1.5–4.5)
Glucose: 100 mg/dL — ABNORMAL HIGH (ref 70–99)
Potassium: 3.7 mmol/L (ref 3.5–5.2)
Sodium: 144 mmol/L (ref 134–144)
Total Protein: 6.3 g/dL (ref 6.0–8.5)
eGFR: 27 mL/min/{1.73_m2} — ABNORMAL LOW (ref >59)

## 2022-04-25 LAB — LIPID PANEL
Chol/HDL Ratio: 2.6 ratio (ref 0.0–4.4)
Cholesterol, Total: 112 mg/dL (ref 100–199)
HDL: 43 mg/dL (ref >39)
LDL Chol Calc (NIH): 43 mg/dL (ref 0–99)
Triglycerides: 155 mg/dL — ABNORMAL HIGH (ref 0–149)
VLDL Cholesterol Cal: 26 mg/dL (ref 5–40)

## 2022-05-02 ENCOUNTER — Ambulatory Visit: Payer: Medicare Other

## 2022-05-02 ENCOUNTER — Ambulatory Visit
Admission: RE | Admit: 2022-05-02 | Discharge: 2022-05-02 | Disposition: A | Discharge status: Home / Self Care | Payer: Medicare Other | Source: Ambulatory Visit | Attending: Hematology and Oncology | Admitting: Hematology and Oncology

## 2022-05-02 DIAGNOSIS — M25611 Stiffness of right shoulder, not elsewhere classified: Secondary | ICD-10-CM | POA: Diagnosis not present

## 2022-05-02 DIAGNOSIS — D631 Anemia in chronic kidney disease: Secondary | ICD-10-CM | POA: Diagnosis not present

## 2022-05-02 DIAGNOSIS — I129 Hypertensive chronic kidney disease with stage 1 through stage 4 chronic kidney disease, or unspecified chronic kidney disease: Secondary | ICD-10-CM | POA: Diagnosis not present

## 2022-05-02 DIAGNOSIS — C50411 Malignant neoplasm of upper-outer quadrant of right female breast: Secondary | ICD-10-CM

## 2022-05-02 DIAGNOSIS — N184 Chronic kidney disease, stage 4 (severe): Secondary | ICD-10-CM | POA: Diagnosis not present

## 2022-05-02 DIAGNOSIS — R600 Localized edema: Secondary | ICD-10-CM | POA: Diagnosis not present

## 2022-05-02 DIAGNOSIS — M8589 Other specified disorders of bone density and structure, multiple sites: Secondary | ICD-10-CM | POA: Diagnosis not present

## 2022-05-02 DIAGNOSIS — Z170 Estrogen receptor positive status [ER+]: Secondary | ICD-10-CM | POA: Diagnosis not present

## 2022-05-02 DIAGNOSIS — M810 Age-related osteoporosis without current pathological fracture: Secondary | ICD-10-CM | POA: Diagnosis not present

## 2022-05-02 DIAGNOSIS — Z780 Asymptomatic menopausal state: Secondary | ICD-10-CM | POA: Diagnosis not present

## 2022-05-02 DIAGNOSIS — M069 Rheumatoid arthritis, unspecified: Secondary | ICD-10-CM | POA: Diagnosis not present

## 2022-05-02 NOTE — Patient Instructions (Signed)
Cancer Rehab [phone] Start with circles near the neck, doing 5-10 on each side.       Deep Effective Breath   Standing, sitting, or laying down, place both hands on the belly. Take a deep breath IN, expanding the belly; then breath OUT, contracting the belly. Repeat __5__ times. Do __2-3__ sessions per day and before your self massage.  Axilla to Axilla - Sweep   On uninvolved side make 5 circles in the armpit, then pump _5__ times from involved armpit across chest to uninvolved armpit, making a pathway. Do _1__ time per day.  Copyright  VHI. All rights reserved.  Axilla to Inguinal Nodes - Sweep   On involved side, make 5 circles at groin at panty line, then pump _5__ times from armpit along side of trunk to outer hip, making your other pathway. Do __1_ time per day.  Copyright  VHI. All rights reserved.  Arm Posterior: Elbow to Shoulder - Sweep   Pump _5__ times from back of elbow to top of shoulder. Then inner to outer upper arm _5_ times, then outer arm again _5_ times. Then back to the pathways _2-3_ times. Do _1__ time per day.  Copyright  VHI. All rights reserved.  ARM: [place] Wrist to Elbow - Sweep   Pump or stationary circles _5__ times from wrist to elbow making sure to do both sides of the forearm. Then retrace your steps to the outer arm, and the pathways _2-3_ times each. Do _1__ time per day.  Copyright  VHI. All rights reserved.  ARM: Dorsum of Hand to Shoulder - Sweep   Pump or stationary circles _5__ times on back of hand including knuckle spaces and individual fingers if needed working up towards the wrist, then retrace all your steps working back up the forearm, doing both sides; upper outer arm and back to your pathways _2-3_ times each. Then do 5 circles again at uninvolved armpit and involved groin where you started! Good job!! Do __1_ time per day.

## 2022-05-02 NOTE — Therapy (Signed)
OUTPATIENT PHYSICAL THERAPY TREATMENT NOTE   Patient Name: Samantha Mcbride MRN: [id number] DOB:Jun 02, 1951, 71 y.o., female 75 Date: 05/02/2022  PCP: Antony Contras REFERRING PROVIDER: Nicholas Lose, MD  END OF SESSION:   PT End of Session - 05/02/22 1059     Visit Number 2    Number of Visits 9    Date for PT Re-Evaluation 05/21/22    PT Start Time 80    PT Stop Time 1159    PT Time Calculation (min) 59 min    Activity Tolerance Patient tolerated treatment well    Behavior During Therapy [facility] for tasks assessed/performed             Past Medical History:  Diagnosis Date   Anemia    iron deficiency anemia    CAD S/P percutaneous coronary angioplasty 08/2011   Promus DES - mid LAD 3.5 mm x 24 mm (3.72 mm)    Cancer (HCC)    Chronic kidney disease    stage 4 kidney disease per pt dx 12/2990   Complication of anesthesia    Dyslipidemia, goal LDL below 70     on statin, close to goal   Family history of breast cancer    Family history of skin cancer    Former moderate cigarette smoker (10-19 per day)    Glucose intolerance (impaired glucose tolerance)    H/O thyroid nodule    Benign   History 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 --> Promus DES =>  EF 40 to 45% (similar to 2012) stable wall motion normality (severe HK of midapical anterior apical wall-consistent with prior infarct).  GRII DD.  Normal valves. => Stable compared to 08/30/2011.  EF was still 40 to 45%.   Hypertension    PONV (postoperative nausea and vomiting)    no issues with surgery on 05-11-2019   Rheumatoid arthritis ([place])    managed on humira    SOB (shortness of breath) on exertion    reports "its been that way since my heart attack " repots no recurrence of MI sx since that time    Past Surgical History:  Procedure Laterality Date   ABDOMINAL AORTAGRAM N/A 08/27/2011   Procedure: ABDOMINAL Maxcine Ham;  Surgeon:  Troy Sine, MD;  Location: [facility] CATH LAB;  Service: Cardiovascular;  Laterality: N/A;   ANKLE SURGERY Right 1995   per pt ankle [facility] EXC[facility] BI[facility]SY Left    BREAST LUMPECTOMY WITH RADIOACTIVE SEED AND SENTINEL LYMPH NODE BIOPSY Right 08/05/2020   Procedure: RIGHT BREAST LUMPECTOMY WITH RADIOACTIVE SEED AND SENTINEL LYMPH NODE BIOPSY;  Surgeon: [name], MD;  Location: [place];  Service: General;  Laterality: Right;   CYSTOSCOPY WITH RETROGRADE PYELOGRAM, URETEROSCOPY AND STENT PLACEMENT Bilateral 05/11/2019   Procedure: CYSTOSCOPY WITH RETROGRADE PYELOGRAM,  AND STENT PLACEMENT;  Surgeon: Lucas Mallow, MD;  Location: [facility];  Service: Urology;  Laterality: Bilateral;   CYSTOSCOPY/URETEROSCOPY/HOLMIUM LASER/STENT PLACEMENT Bilateral 05/25/2019   Procedure: CYSTOSCOPY BILATERAL URETEROSCOPY/HOLMIUM LASER/STENT PLACEMENT;  Surgeon: Lucas Mallow, MD;  Location: [facility];  Service: Urology;  Laterality: Bilateral;   LEFT HEART CATHETERIZATION WITH CORONARY ANGIOGRAM N/A 08/27/2011   Procedure: LEFT HEART CATHETERIZATION WITH CORONARY ANGIOGRAM;  Surgeon: Troy Sine, MD;  Location: [facility] C[facility] LAB;  Service: Cardiovascular;;For anterior STEMI/cardiac arrest -- 100% mid LAD occlusion   PERCUTANEOUS CORONARY STENT  INTERVENTION (PCI-S) N/A 08/27/2011   Procedure: PERCUTANEOUS CORONARY STENT INTERVENTION (PCI-S);  Surgeon: Troy Sine, MD;  Location: [facility] CATH LAB;  Service: Cardiovascular;;  mid LAD PCI --> Promus Element DES 3.5 mm at 24 mm (3.72 mm)   TRANSTHORACIC ECHOCARDIOGRAM  08/2011   EF 40-45%, moderate at K. of mid and distal inferior septum and anterior apical myocardium. Grade 1 diastolic function. -- Followup echocardiogram to reassess his EF was denied by insurance company   TRANSTHORACIC ECHOCARDIOGRAM  07/20/2020    EF 40 to 45% (similar to 2012) stable wall motion normality (severe HK of midapical anterior apical wall-consistent  with prior infarct).  GRII DD.  Normal valves. => Stable compared to 08/30/2011.  EF was still 40 to 45%.   Patient Active Problem List   Diagnosis Date Noted   Breast cancer ([place]) 10/03/2021   Osteoarthritis 10/03/2021   Osteoporosis 07/10/2021   Thoracic aortic atherosclerosis (HCC)-seen on CT 05/11/2021   COPD (chronic obstructive pulmonary disease) with emphysema ([place]) noted on CT 05/11/2021   Age-related osteoporosis without current pathological fracture 02/02/2021   Chronic kidney disease, stage 4 (severe) ([place]) 02/02/2021   Insomnia 02/02/2021   Localized, primary osteoarthritis of hand 02/02/2021   Osteopenia 02/02/2021   Restless legs 02/02/2021   Rheumatoid arthritis ([place]) 02/02/2021   Sciatica 02/02/2021   Vitamin D deficiency 02/02/2021   Genetic testing 07/15/2020   Preoperative cardiovascular examination 07/11/2020   Family history of breast cancer    Family history of skin cancer    Malignant neoplasm of upper-outer quadrant of right breast in female, estrogen receptor positive ([place]) 06/15/2020   Pain in left knee 05/28/2019   Ureteral calculus 05/11/2019   Obesity (BMI 30-39.9) 07/18/2013   Essential hypertension    Hyperlipidemia associated with type 2 diabetes mellitus ([place]) 07/16/2013   Glucose intolerance (impaired glucose tolerance) 07/16/2013   Ventricular tachycardia, sustained; Peri-infarct.  No further episodes 08/28/2011   Hypokalemia 08/27/2011   H/O Anterior STEMI:  Proximal LAD  insertion of a 3.5x24 mm Promus element DES (08/2011) 08/27/2011   CAD S/P percutaneous coronary angioplasty 08/16/2011    REFERRING DIAG: C50.411,Z17.0 (ICD-10-CM) - Malignant neoplasm of upper-outer quadrant of right breast in female, estrogen receptor positive ([place])  THERAPY DIAG:  Localized edema  Stiffness of right shoulder, not elsewhere classified  Rationale for Evaluation and Treatment Rehabilitation  PERTINENT HISTORY: Rt breast lumpectomy on 08/05/20 with 2  lymph nodes removed both negative.  Completed radiation.  On Anastrozole x 10/28/20 tolerating well   PRECAUTIONS: Other: at risk of lymphedema RUE  SUBJECTIVE: Nothing new since I was here for the evaluation.   PAIN:  Are you having pain? No   OBJECTIVE: (objective measures completed at initial evaluation unless otherwise dated)   PALPATION: Fullness in R upper arm, thick per palpation when compared to L side despite circumferences being within 2 cm the swelling can be easily seen   POSTURE: rounded shoulders, forward head   UPPER EXTREMITY AROM/PROM:   A/PROM RIGHT   eval    Shoulder extension 71  Shoulder flexion 150  Shoulder abduction 177  Shoulder internal rotation 60  Shoulder external rotation 77                          (Blank rows = not tested)   A/PROM LEFT   eval  Shoulder extension 62  Shoulder flexion 160  Shoulder abduction 168  Shoulder internal rotation 58  Shoulder external rotation  75                          (Blank rows = not tested)       LYMPHEDEMA ASSESSMENTS:    SURGERY TYPE/DATE: Rt breast lumpectomy on 08/05/20   NUMBER OF LYMPH NODES REMOVED: 2 both negative   CHEMOTHERAPY: did not require   RADIATION:completed in Dec 2021   HORMONE TREATMENT: currently taking anastrozole   INFECTIONS: none   LYMPHEDEMA ASSESSMENTS:    LANDMARK RIGHT  eval  15 cm proximal to olecranon process 34.5  10 cm proximal to olecranon process 35.1  Olecranon process 25.9  10 cm proximal to ulnar styloid process 21.7  Just proximal to ulnar styloid process 17  Across hand at thumb web space 18.9  At base of 2nd digit 6.8  (Blank rows = not tested)   LANDMARK LEFT  eval  15 cm proximal to olecranon process 33.8  10 cm proximal to olecranon process 33  Olecranon process 26.4  10 cm proximal to ulnar styloid process 21.2  Just proximal to ulnar styloid process 16.5  Across hand at thumb web space 18.7  At base of 2nd digit 6.8  (Blank rows = not  tested)     QUICK DASH SURVEY:    Katina Dung - 04/23/22 0001       Open a tight or new jar No difficulty     Do heavy household chores (wash walls, wash floors) No difficulty     Carry a shopping bag or briefcase No difficulty     Wash your back No difficulty     Use a knife to cut food No difficulty     Recreational activities in which you take some force or impact through your arm, shoulder, or hand (golf, hammering, tennis) Mild difficulty     During the past week, to what extent has your arm, shoulder or hand problem interfered with your normal social activities with family, friends, neighbors, or groups? Not at all     During the past week, to what extent has your arm, shoulder or hand problem limited your work or other regular daily activities Not at all     Arm, shoulder, or hand pain. None     Tingling (pins and needles) in your arm, shoulder, or hand Mild     Difficulty Sleeping Mild difficulty     DASH Score 6.82 %                       TODAY'S TREATMENT  05/02/22: Manual Therapy Manual lymph drainage:  In Supine to Rt UE: Short neck, 5 diaphragmatic breaths, Lt axillary and Rt inguinal nodes, then Rt UE working from proximal to distal then retracing all steps back to lymph nodes beginning to instruct pt throughout in basics of anatomy of lymphatic system and principles of MLD; also had her return demo of retracing each step using hand over hand technique and VCs. She was able to return very good demo with only min VCs required for correct direction of skin stretch so handout was issued MFR to Rt axilla and over scar tissue near incision where pt reports she has been feeling increased tenderness since her UE has been swelling more.  P/ROM to Rt shoulder into flexion, abd and D2 for increased stretch during MFR. This seemed to improve some by end of session with less palpable tightness at scarring and improved motion.  04/23/22: Edema management: measured pt for an off the  shelf sleeve. She did not fit in to an off the shelf [facility] or an Exo Strong but did fit in to a size II bella strong. Will hold off on ordering this to see if her arm changes any with several MLD sessions.    PATIENT EDUCATION:  Education details: Self MLD Person educated: Patient Education method: Explanation, Corporate treasurer and verbal cuing Education comprehension: verbalized understanding, returned demo and handout issued     HOME EXERCISE PROGRAM: Self MLD daily   ASSESSMENT:   CLINICAL IMPRESSION: Began performing and instruction of manual lymph drainage of Rt UE. Pt did well with returning correct demonstration and so handout was issued with this. Pt was instructed to be mindful of urinary output in that this should not decrease with MLD and may increase, which would be normal response. Explained to her that MLD will increase amount of fluid for kidneys to filtrate and so she needs to be mindful that if her urinary output decreases she should stop MLD and we may need to have her see her nephrologist for a follow up due to her reporting being stage 4 kidney disease, but reports being stable. Pt was able to verbalize good understanding. Also performed MFR to scar tissue at incision near axillary incision which did seem to soften some by end of session.      OBJECTIVE IMPAIRMENTS decreased ROM, increased edema, and pain.    ACTIVITY LIMITATIONS carrying and lifting   PARTICIPATION LIMITATIONS:  none   PERSONAL FACTORS  none  are also affecting patient's functional outcome.    REHAB POTENTIAL: Excellent   CLINICAL DECISION MAKING: Stable/uncomplicated   EVALUATION COMPLEXITY: Low   GOALS: Goals reviewed with patient? Yes   SHORT TERM GOALS = LONG TERM GOALS   LONG TERM GOALS: Target date: 05/21/2022     Pt will obtain appropriate compression garments for long term management of edema. Baseline:  Goal status: INITIAL   2.  Pt will be independent in self MLD for long term  management of edema.  Baseline:  Goal status: INITIAL   3.  Pt will demonstrate improved R shoulder flexion ROM to 160 degrees to allow her to reach overhead. Baseline: 150 Goal status: INITIAL       PLAN: PT FREQUENCY: 2x/week   PT DURATION: 4 weeks   PLANNED INTERVENTIONS: Therapeutic exercises, Patient/Family education, Orthotic/Fit training, Manual lymph drainage, Compression bandaging, Vasopneumatic device, and Manual therapy   PLAN FOR NEXT SESSION: begin MLD and instruct pt, any change in urinary output due to stage 4 kidney disease? (But pt reports she is stable) see if she fits in to harmony after a few session, currently fits in to size 2 bella strong   Otelia Limes, PTA 05/02/2022, 12:01 PM              Can[facility] [phone] Start with circles near the neck, doing 5-10 on each side.       Deep Effective Breath   Standing, sitting, or laying down, place both hands on the belly. Take a deep breath IN, expanding the belly; then breath OUT, contracting the belly. Repeat __5__ times. Do __2-3__ sessions per day and before your self massage.  Axilla to Axilla - Sweep   On uninvolved side make 5 circles in the armpit, then pump _5__ times from involved armpit across chest to uninvolved armpit, making a pathway. Do _1__ time per day.  Copyright  VHI. All  rights reserved.  Axilla to Inguinal Nodes - Sweep   On involved side, make 5 circles at groin at panty line, then pump _5__ times from armpit along side of trunk to outer hip, making your other pathway. Do __1_ time per day.  Copyright  VHI. All rights reserved.  Arm Posterior: Elbow to Shoulder - Sweep   Pump _5__ times from back of elbow to top of shoulder. Then inner to outer upper arm _5_ times, then outer arm again _5_ times. Then back to the pathways _2-3_ times. Do _1__ time per day.  Copyright  VHI. All rights reserved.  ARM: [place] Wrist to Elbow - Sweep   Pump or stationary circles  _5__ times from wrist to elbow making sure to do both sides of the forearm. Then retrace your steps to the outer arm, and the pathways _2-3_ times each. Do _1__ time per day.  Copyright  VHI. All rights reserved.  ARM: Dorsum of Hand to Shoulder - Sweep   Pump or stationary circles _5__ times on back of hand including knuckle spaces and individual fingers if needed working up towards the wrist, then retrace all your steps working back up the forearm, doing both sides; upper outer arm and back to your pathways _2-3_ times each. Then do 5 circles again at uninvolved armpit and involved groin where you started! Good job!! Do __1_ time per day.

## 2022-05-08 ENCOUNTER — Ambulatory Visit: Payer: Medicare Other | Admitting: Rehabilitation

## 2022-05-08 ENCOUNTER — Encounter: Payer: Self-pay | Admitting: Rehabilitation

## 2022-05-08 DIAGNOSIS — M25611 Stiffness of right shoulder, not elsewhere classified: Secondary | ICD-10-CM

## 2022-05-08 DIAGNOSIS — Z170 Estrogen receptor positive status [ER+]: Secondary | ICD-10-CM | POA: Diagnosis not present

## 2022-05-08 DIAGNOSIS — R600 Localized edema: Secondary | ICD-10-CM | POA: Diagnosis not present

## 2022-05-08 DIAGNOSIS — C50411 Malignant neoplasm of upper-outer quadrant of right female breast: Secondary | ICD-10-CM | POA: Diagnosis not present

## 2022-05-08 DIAGNOSIS — Z483 Aftercare following surgery for neoplasm: Secondary | ICD-10-CM

## 2022-05-08 NOTE — Therapy (Signed)
OUTPATIENT PHYSICAL THERAPY TREATMENT NOTE   Patient Name: Samantha Mcbride MRN: [id number] DOB:Nov 21, 1950, 71 y.o., female Today's Date: 05/08/2022  PCP: Antony Contras REFERRING PROVIDER: Nicholas Lose, MD  END OF SESSION:   PT End of Session - 05/08/22 1156     Visit Number 3    Number of Visits 9    Date for PT Re-Evaluation 05/21/22    PT Start Time 1055    PT Stop Time 1150    PT Time Calculation (min) 55 min    Activity Tolerance Patient tolerated treatment well    Behavior During Therapy [facility] for tasks assessed/performed              Past Medical History:  Diagnosis Date   Anemia    iron deficiency anemia    CAD S/P percutaneous coronary angioplasty 08/2011   Promus DES - mid LAD 3.5 mm x 24 mm (3.72 mm)    Cancer (HCC)    Chronic kidney disease    stage 4 kidney disease per pt dx 10/4968   Complication of anesthesia    Dyslipidemia, goal LDL below 70     on statin, close to goal   Family history of breast cancer    Family history of skin cancer    Former moderate cigarette smoker (10-19 per day)    Glucose intolerance (impaired glucose tolerance)    H/O thyroid nodule    Benign   History 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 --> Promus DES =>  EF 40 to 45% (similar to 2012) stable wall motion normality (severe HK of midapical anterior apical wall-consistent with prior infarct).  GRII DD.  Normal valves. => Stable compared to 08/30/2011.  EF was still 40 to 45%.   Hypertension    PONV (postoperative nausea and vomiting)    no issues with surgery on 05-11-2019   Rheumatoid arthritis ([place])    managed on humira    SOB (shortness of breath) on exertion    reports "its been that way since my heart attack " repots no recurrence of MI sx since that time    Past Surgical History:  Procedure Laterality Date   ABDOMINAL AORTAGRAM N/A 08/27/2011   Procedure: ABDOMINAL Maxcine Ham;   Surgeon: Troy Sine, MD;  Location: [facility] CATH LAB;  Service: Cardiovascular;  Laterality: N/A;   ANKLE SURGERY Right 1995   per pt ankle surgery here at [facility] EXCISIONAL BIOPSY Left    BREAST LUMPECTOMY WITH RADIOACTIVE SEED AND SENTINEL LYMPH NODE BIOPSY Right 08/05/2020   Procedure: RIGHT BREAST LUMPECTOMY WITH RADIOACTIVE SEED AND SENTINEL LYMPH NODE BIOPSY;  Surgeon: [name], MD;  Location: [facility];  Service: General;  Laterality: Right;   CYSTOSCOPY WITH RETROGRADE PYELOGRAM, URETEROSCOPY AND STENT PLACEMENT Bilateral 05/11/2019   Procedure: CYSTOSCOPY WITH RETROGRADE PYELOGRAM,  AND STENT PLACEMENT;  Surgeon: Lucas Mallow, MD;  Location: [facility];  Service: Urology;  Laterality: Bilateral;   CYSTOSCOPY/URETEROSCOPY/HOLMIUM LASER/STENT PLACEMENT Bilateral 05/25/2019   Procedure: CYSTOSCOPY BILATERAL URETEROSCOPY/HOLMIUM LASER/STENT PLACEMENT;  Surgeon: Lucas Mallow, MD;  Location: [facility];  Service: Urology;  Laterality: Bilateral;   LEFT HEART CATHETERIZATION WITH CORONARY ANGIOGRAM N/A 08/27/2011   Procedure: LEFT HEART CATHETERIZATION WITH CORONARY ANGIOGRAM;  Surgeon: Troy Sine, MD;  Location: [facility] CATH LAB;  Service: Cardiovascular;;For anterior STEMI/cardiac arrest -- 100% mid LAD occlusion   PERCUTANEOUS CORONARY  STENT INTERVENTION (PCI-S) N/A 08/27/2011   Procedure: PERCUTANEOUS CORONARY STENT INTERVENTION (PCI-S);  Surgeon: Troy Sine, MD;  Location: [facility] CATH LAB;  Service: Cardiovascular;;  mid LAD PCI --> Promus Element DES 3.5 mm at 24 mm (3.72 mm)   TRANSTHORACIC ECHOCARDIOGRAM  08/2011   EF 40-45%, moderate at K. of mid and distal inferior septum and anterior apical myocardium. Grade 1 diastolic function. -- Followup echocardiogram to reassess his EF was denied by insurance company   TRANSTHORACIC ECHOCARDIOGRAM  07/20/2020    EF 40 to 45% (similar to 2012) stable wall motion normality (severe HK of midapical anterior apical  wall-consistent with prior infarct).  GRII DD.  Normal valves. => Stable compared to 08/30/2011.  EF was still 40 to 45%.   Patient Active Problem List   Diagnosis Date Noted   Breast cancer ([place]) 10/03/2021   Osteoarthritis 10/03/2021   Osteoporosis 07/10/2021   Thoracic aortic atherosclerosis (HCC)-seen on CT 05/11/2021   COPD (chronic obstructive pulmonary disease) with emphysema ([place]) noted on CT 05/11/2021   Age-related osteoporosis without current pathological fracture 02/02/2021   Chronic kidney disease, stage 4 (severe) ([place]) 02/02/2021   Insomnia 02/02/2021   Localized, primary osteoarthritis of hand 02/02/2021   Osteopenia 02/02/2021   Restless legs 02/02/2021   Rheumatoid arthritis ([place]) 02/02/2021   Sciatica 02/02/2021   Vitamin D deficiency 02/02/2021   Genetic testing 07/15/2020   Preoperative cardiovascular examination 07/11/2020   Family history of breast cancer    Family history of skin cancer    Malignant neoplasm of upper-outer quadrant of right breast in female, estrogen receptor positive ([place]) 06/15/2020   Pain in left knee 05/28/2019   Ureteral calculus 05/11/2019   Obesity (BMI 30-39.9) 07/18/2013   Essential hypertension    Hyperlipidemia associated with type 2 diabetes mellitus ([place]) 07/16/2013   Glucose intolerance (impaired glucose tolerance) 07/16/2013   Ventricular tachycardia, sustained; Peri-infarct.  No further episodes 08/28/2011   Hypokalemia 08/27/2011   H/O Anterior STEMI:  Proximal LAD  insertion of a 3.5x24 mm Promus element DES (08/2011) 08/27/2011   CAD S/P percutaneous coronary angioplasty 08/16/2011    REFERRING DIAG: C50.411,Z17.0 (ICD-10-CM) - Malignant neoplasm of upper-outer quadrant of right breast in female, estrogen receptor positive ([place])  THERAPY DIAG:  Localized edema  Stiffness of right shoulder, not elsewhere classified  Aftercare following surgery for neoplasm  Rationale for Evaluation and Treatment  Rehabilitation  PERTINENT HISTORY: Rt breast lumpectomy on 08/05/20 with 2 lymph nodes removed both negative.  Completed radiation.  On Anastrozole x 10/28/20 tolerating well   PRECAUTIONS: Other: at risk of lymphedema RUE  SUBJECTIVE: I'm not sure if the massage helps.  It always feels the same.    PAIN:  Are you having pain? No   OBJECTIVE: (objective measures completed at initial evaluation unless otherwise dated)  PALPATION: Fullness in R upper arm, thick per palpation when compared to L side despite circumferences being within 2 cm the swelling can be easily seen   UPPER EXTREMITY AROM/PROM:   A/PROM RIGHT   eval    Shoulder extension 71  Shoulder flexion 150  Shoulder abduction 177  Shoulder internal rotation 60  Shoulder external rotation 77                          (Blank rows = not tested)   A/PROM LEFT   eval  Shoulder extension 62  Shoulder flexion 160  Shoulder abduction 168  Shoulder internal rotation 58  Shoulder external rotation 75                          (Blank rows = not tested)    LYMPHEDEMA ASSESSMENTS:   LANDMARK RIGHT  Eval 04/23/22 05/08/22  15 cm proximal to olecranon process 34.5 34.5  10 cm proximal to olecranon process 35.1 34.9  Olecranon process 25.9 25.5  10 cm proximal to ulnar styloid process 21.7 22  Just proximal to ulnar styloid process 17 17  Across hand at thumb web space 18.9 18.5  At base of 2nd digit 6.8 6.7  (Blank rows = not tested)   LANDMARK LEFT  eval  15 cm proximal to olecranon process 33.8  10 cm proximal to olecranon process 33  Olecranon process 26.4  10 cm proximal to ulnar styloid process 21.2  Just proximal to ulnar styloid process 16.5  Across hand at thumb web space 18.7  At base of 2nd digit 6.8  (Blank rows = not tested)      TODAY'S TREATMENT  05/08/22 Manual Therapy Ordered pt a bella strong size 5 regular in clinic using pt credit card Manual lymph drainage:  In Supine to Rt UE: Short neck, 5  diaphragmatic breaths, Lt axillary and Rt inguinal nodes, then Rt UE working from proximal to distal then retracing all steps back to lymph nodes  MFR to Rt axilla and over scar tissue near incision where pt reports she has been feeling increased tenderness since her UE has been swelling more.  P/ROM to Rt shoulder into flexion, abd and D2 for increased stretch during MFR.   05/02/22 Manual Therapy Manual lymph drainage:  In Supine to Rt UE: Short neck, 5 diaphragmatic breaths, Lt axillary and Rt inguinal nodes, then Rt UE working from proximal to distal then retracing all steps back to lymph nodes beginning to instruct pt throughout in basics of anatomy of lymphatic system and principles of MLD; also had her return demo of retracing each step using hand over hand technique and VCs. She was able to return very good demo with only min VCs required for correct direction of skin stretch so handout was issued MFR to Rt axilla and over scar tissue near incision where pt reports she has been feeling increased tenderness since her UE has been swelling more.  P/ROM to Rt shoulder into flexion, abd and D2 for increased stretch during MFR. This seemed to improve some by end of session with less palpable tightness at scarring and improved motion.   04/23/22: Edema management: measured pt for an off the shelf sleeve. She did not fit in to an off the shelf [facility] or an Exo Strong but did fit in to a size II bella strong. Will hold off on ordering this to see if her arm changes any with several MLD sessions.    PATIENT EDUCATION:  Education details: Self MLD Person educated: Patient Education method: Explanation, Corporate treasurer and verbal cuing Education comprehension: verbalized understanding, returned demo and handout issued    HOME EXERCISE PROGRAM: Self MLD daily   ASSESSMENT:  CLINICAL IMPRESSION: Ordered garment as pt has the same measurements and seems to find no change or fluctuation in edema or  feeling in the UE throughout the day or before or after MLD.  WIll continue visits until garment has arrived and fits well.    OBJECTIVE IMPAIRMENTS decreased ROM, increased edema, and pain.    ACTIVITY LIMITATIONS carrying and lifting   PARTICIPATION  LIMITATIONS:  none   PERSONAL FACTORS  none  are also affecting patient's functional outcome.    REHAB POTENTIAL: Excellent   CLINICAL DECISION MAKING: Stable/uncomplicated   EVALUATION COMPLEXITY: Low   GOALS: Goals reviewed with patient? Yes   SHORT TERM GOALS = LONG TERM GOALS   LONG TERM GOALS: Target date: 05/21/2022     Pt will obtain appropriate compression garments for long term management of edema. Baseline:  Goal status: INITIAL   2.  Pt will be independent in self MLD for long term management of edema.  Baseline:  Goal status: INITIAL   3.  Pt will demonstrate improved R shoulder flexion ROM to 160 degrees to allow her to reach overhead. Baseline: 150 Goal status: INITIAL       PLAN: PT FREQUENCY: 2x/week   PT DURATION: 4 weeks   PLANNED INTERVENTIONS: Therapeutic exercises, Patient/Family education, Orthotic/Fit training, Manual lymph drainage, Compression bandaging, Vasopneumatic device, and Manual therapy   PLAN FOR NEXT SESSION: begin MLD and instruct pt, any change in urinary output due to stage 4 kidney disease? (But pt reports she is stable) see if she fits in to harmony after a few session, currently fits in to size 2 bella strong   Becky Colan, Adrian Prince, PT 05/08/2022, 11:57 AM              Cancer Rehab [phone] Start with circles near the neck, doing 5-10 on each side.       Deep Effective Breath   Standing, sitting, or laying down, place both hands on the belly. Take a deep breath IN, expanding the belly; then breath OUT, contracting the belly. Repeat __5__ times. Do __2-3__ sessions per day and before your self massage.  Axilla to Axilla - Sweep   On uninvolved side make 5 circles in the armpit,  then pump _5__ times from involved armpit across chest to uninvolved armpit, making a pathway. Do _1__ time per day.  Copyright  VHI. All rights reserved.  Axilla to Inguinal Nodes - Sweep   On involved side, make 5 circles at groin at panty line, then pump _5__ times from armpit along side of trunk to outer hip, making your other pathway. Do __1_ time per day.  Copyright  VHI. All rights reserved.  Arm Posterior: Elbow to Shoulder - Sweep   Pump _5__ times from back of elbow to top of shoulder. Then inner to outer upper arm _5_ times, then outer arm again _5_ times. Then back to the pathways _2-3_ times. Do _1__ time per day.  Copyright  VHI. All rights reserved.  ARM: [place] Wrist to Elbow - Sweep   Pump or stationary circles _5__ times from wrist to elbow making sure to do both sides of the forearm. Then retrace your steps to the outer arm, and the pathways _2-3_ times each. Do _1__ time per day.  Copyright  VHI. All rights reserved.  ARM: Dorsum of Hand to Shoulder - Sweep   Pump or stationary circles _5__ times on back of hand including knuckle spaces and individual fingers if needed working up towards the wrist, then retrace all your steps working back up the forearm, doing both sides; upper outer arm and back to your pathways _2-3_ times each. Then do 5 circles again at uninvolved armpit and involved groin where you started! Good job!! Do __1_ time per day.

## 2022-05-09 ENCOUNTER — Ambulatory Visit
Admission: RE | Admit: 2022-05-09 | Discharge: 2022-05-09 | Disposition: A | Discharge status: Home / Self Care | Payer: Medicare Other | Source: Ambulatory Visit | Attending: Adult Health | Admitting: Adult Health

## 2022-05-09 DIAGNOSIS — R928 Other abnormal and inconclusive findings on diagnostic imaging of breast: Secondary | ICD-10-CM | POA: Diagnosis not present

## 2022-05-09 DIAGNOSIS — Z853 Personal history of malignant neoplasm of breast: Secondary | ICD-10-CM | POA: Diagnosis not present

## 2022-05-09 DIAGNOSIS — Z170 Estrogen receptor positive status [ER+]: Secondary | ICD-10-CM

## 2022-05-09 HISTORY — DX: Personal history of irradiation: Z92.3

## 2022-05-10 ENCOUNTER — Ambulatory Visit: Payer: Medicare Other | Admitting: Rehabilitation

## 2022-05-10 ENCOUNTER — Encounter: Payer: Self-pay | Admitting: Rehabilitation

## 2022-05-10 DIAGNOSIS — M25611 Stiffness of right shoulder, not elsewhere classified: Secondary | ICD-10-CM | POA: Diagnosis not present

## 2022-05-10 DIAGNOSIS — R600 Localized edema: Secondary | ICD-10-CM

## 2022-05-10 DIAGNOSIS — Z483 Aftercare following surgery for neoplasm: Secondary | ICD-10-CM

## 2022-05-10 DIAGNOSIS — C50411 Malignant neoplasm of upper-outer quadrant of right female breast: Secondary | ICD-10-CM | POA: Diagnosis not present

## 2022-05-10 DIAGNOSIS — Z170 Estrogen receptor positive status [ER+]: Secondary | ICD-10-CM | POA: Diagnosis not present

## 2022-05-10 NOTE — Therapy (Signed)
OUTPATIENT PHYSICAL THERAPY TREATMENT NOTE   Patient Name: Samantha Mcbride MRN: [id number] DOB:03-26-1951, 71 y.o., female Today's Date: 05/10/2022  PCP: Antony Contras REFERRING PROVIDER: Nicholas Lose, MD  END OF SESSION:   PT End of Session - 05/10/22 0957     Visit Number 4    Number of Visits 9    Date for PT Re-Evaluation 05/21/22    PT Start Time 1000    PT Stop Time 1040    PT Time Calculation (min) 40 min    Activity Tolerance Patient tolerated treatment well    Behavior During Therapy [facility] for tasks assessed/performed              Past Medical History:  Diagnosis Date   Anemia    iron deficiency anemia    CAD S/P percutaneous coronary angioplasty 08/2011   Promus DES - mid LAD 3.5 mm x 24 mm (3.72 mm)    Cancer (HCC)    Chronic kidney disease    stage 4 kidney disease per pt dx 05/8827   Complication of anesthesia    Dyslipidemia, goal LDL below 70     on statin, close to goal   Family history of breast cancer    Family history of skin cancer    Former moderate cigarette smoker (10-19 per day)    Glucose intolerance (impaired glucose tolerance)    H/O thyroid nodule    Benign   History 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 --> Promus DES =>  EF 40 to 45% (similar to 2012) stable wall motion normality (severe HK of midapical anterior apical wall-consistent with prior infarct).  GRII DD.  Normal valves. => Stable compared to 08/30/2011.  EF was still 40 to 45%.   Hypertension    Personal history of radiation therapy    PONV (postoperative nausea and vomiting)    no issues with surgery on 05-11-2019   Rheumatoid arthritis ([place])    managed on humira    SOB (shortness of breath) on exertion    reports "its been that way since my heart attack " repots no recurrence of MI sx since that time    Past Surgical History:  Procedure Laterality Date   ABDOMINAL AORTAGRAM N/A 08/27/2011    Procedure: ABDOMINAL Maxcine Ham;  Surgeon: Troy Sine, MD;  Location: [facility] CATH LAB;  Service: Cardiovascular;  Laterality: N/A;   ANKLE SURGERY Right 1995   per pt ankle surgery here at [place] EXCISIONAL BIOPSY Left    BREAST LUMPECTOMY Right 07/2020   BREAST LUMPECTOMY WITH RADIOACTIVE SEED AND SENTINEL LYMPH NODE BIOPSY Right 08/05/2020   Procedure: RIGHT BREAST LUMPECTOMY WITH RADIOACTIVE SEED AND SENTINEL LYMPH NODE BIOPSY;  Surgeon: [name], MD;  Location: [place];  Service: General;  Laterality: Right;   CYSTOSCOPY WITH RETROGRADE PYELOGRAM, URETEROSCOPY AND STENT PLACEMENT Bilateral 05/11/2019   Procedure: CYSTOSCOPY WITH RETROGRADE PYELOGRAM,  AND STENT PLACEMENT;  Surgeon: Lucas Mallow, MD;  Location: [facility];  Service: Urology;  Laterality: Bilateral;   CYSTOSCOPY/URETEROSCOPY/HOLMIUM LASER/STENT PLACEMENT Bilateral 05/25/2019   Procedure: CYSTOSCOPY BILATERAL URETEROSCOPY/HOLMIUM LASER/STENT PLACEMENT;  Surgeon: Lucas Mallow, MD;  Location: [facility];  Service: Urology;  Laterality: Bilateral;   LEFT HEART CATHETERIZATION WITH CORONARY ANGIOGRAM N/A 08/27/2011   Procedure: LEFT HEART CATHETERIZATION WITH CORONARY ANGIOGRAM;  Surgeon: Troy Sine, MD;  Location[facility] CATH LAB;  Service: Cardiovascular;;For anterior STEMI/cardiac arrest -- 100% mid LAD occlusion   PERCUTANEOUS CORONARY STENT INTERVENTION (PCI-S) N/A 08/27/2011   Procedure: PERCUTANEOUS CORONARY STENT INTERVENTION (PCI-S);  Surgeon: Troy Sine, MD;  Location: [facility] CATH LAB;  Service: Cardiovascular;;  mid LAD PCI --> Promus Element DES 3.5 mm at 24 mm (3.72 mm)   TRANSTHORACIC ECHOCARDIOGRAM  08/2011   EF 40-45%, moderate at K. of mid and distal inferior septum and anterior apical myocardium. Grade 1 diastolic function. -- Followup echocardiogram to reassess his EF was denied by insurance company   TRANSTHORACIC ECHOCARDIOGRAM  07/20/2020    EF 40 to 45% (similar to 2012)  stable wall motion normality (severe HK of midapical anterior apical wall-consistent with prior infarct).  GRII DD.  Normal valves. => Stable compared to 08/30/2011.  EF was still 40 to 45%.   Patient Active Problem List   Diagnosis Date Noted   Breast cancer ([place]) 10/03/2021   Osteoarthritis 10/03/2021   Osteoporosis 07/10/2021   Thoracic aortic atherosclerosis (HCC)-seen on CT 05/11/2021   COPD (chronic obstructive pulmonary disease) with emphysema ([place]) noted on CT 05/11/2021   Age-related osteoporosis without current pathological fracture 02/02/2021   Chronic kidney disease, stage 4 (severe) ([place]) 02/02/2021   Insomnia 02/02/2021   Localized, primary osteoarthritis of hand 02/02/2021   Osteopenia 02/02/2021   Restless legs 02/02/2021   Rheumatoid arthritis ([place]) 02/02/2021   Sciatica 02/02/2021   Vitamin D deficiency 02/02/2021   Genetic testing 07/15/2020   Preoperative cardiovascular examination 07/11/2020   Family history of breast cancer    Family history of skin cancer    Malignant neoplasm of upper-outer quadrant of right breast in female, estrogen receptor positive ([place]) 06/15/2020   Pain in left knee 05/28/2019   Ureteral calculus 05/11/2019   Obesity (BMI 30-39.9) 07/18/2013   Essential hypertension    Hyperlipidemia associated with type 2 diabetes mellitus ([place]) 07/16/2013   Glucose intolerance (impaired glucose tolerance) 07/16/2013   Ventricular tachycardia, sustained; Peri-infarct.  No further episodes 08/28/2011   Hypokalemia 08/27/2011   H/O Anterior STEMI:  Proximal LAD  insertion of a 3.5x24 mm Promus element DES (08/2011) 08/27/2011   CAD S/P percutaneous coronary angioplasty 08/16/2011    REFERRING DIAG: C50.411,Z17.0 (ICD-10-CM) - Malignant neoplasm of upper-outer quadrant of right breast in female, estrogen receptor positive ([place])  THERAPY DIAG:  Localized edema  Stiffness of right shoulder, not elsewhere classified  Aftercare following surgery for  neoplasm  Rationale for Evaluation and Treatment Rehabilitation  PERTINENT HISTORY: Rt breast lumpectomy on 08/05/20 with 2 lymph nodes removed both negative.  Completed radiation.  On Anastrozole x 10/28/20 tolerating well   PRECAUTIONS: Other: at risk of lymphedema RUE  SUBJECTIVE:  Nothing new.  No sleeve yet.    PAIN:  Are you having pain? No  OBJECTIVE: (objective measures completed at initial evaluation unless otherwise dated)  PALPATION: Fullness in R upper arm, thick per palpation when compared to L side despite circumferences being within 2 cm the swelling can be easily seen   UPPER EXTREMITY AROM/PROM:   A/PROM RIGHT   eval    Shoulder extension 71  Shoulder flexion 150  Shoulder abduction 177  Shoulder internal rotation 60  Shoulder external rotation 77                          (Blank rows = not tested)   A/PROM LEFT   eval  Shoulder extension 62  Shoulder flexion 160  Shoulder  abduction 168  Shoulder internal rotation 58  Shoulder external rotation 75                          (Blank rows = not tested)    LYMPHEDEMA ASSESSMENTS:   LANDMARK RIGHT  Eval 04/23/22 05/08/22  15 cm proximal to olecranon process 34.5 34.5  10 cm proximal to olecranon process 35.1 34.9  Olecranon process 25.9 25.5  10 cm proximal to ulnar styloid process 21.7 22  Just proximal to ulnar styloid process 17 17  Across hand at thumb web space 18.9 18.5  At base of 2nd digit 6.8 6.7  (Blank rows = not tested)   LANDMARK LEFT  eval  15 cm proximal to olecranon process 33.8  10 cm proximal to olecranon process 33  Olecranon process 26.4  10 cm proximal to ulnar styloid process 21.2  Just proximal to ulnar styloid process 16.5  Across hand at thumb web space 18.7  At base of 2nd digit 6.8  (Blank rows = not tested)      TODAY'S TREATMENT  05/10/22 Manual lymph drainage:  In Supine to Rt UE: Short neck, 5 diaphragmatic breaths, Lt axillary and Rt inguinal nodes, then Rt UE  working from proximal to distal then retracing all steps back to lymph nodes  P/ROM to Rt shoulder into flexion, abd and D2 for increased stretch during MFR.   05/08/22 Manual Therapy Ordered pt a bella strong size 5 regular in clinic using pt credit card Manual lymph drainage:  In Supine to Rt UE: Short neck, 5 diaphragmatic breaths, Lt axillary and Rt inguinal nodes, then Rt UE working from proximal to distal then retracing all steps back to lymph nodes  MFR to Rt axilla and over scar tissue near incision where pt reports she has been feeling increased tenderness since her UE has been swelling more.  P/ROM to Rt shoulder into flexion, abd and D2 for increased stretch during MFR.   05/02/22 Manual Therapy Manual lymph drainage:  In Supine to Rt UE: Short neck, 5 diaphragmatic breaths, Lt axillary and Rt inguinal nodes, then Rt UE working from proximal to distal then retracing all steps back to lymph nodes beginning to instruct pt throughout in basics of anatomy of lymphatic system and principles of MLD; also had her return demo of retracing each step using hand over hand technique and VCs. She was able to return very good demo with only min VCs required for correct direction of skin stretch so handout was issued MFR to Rt axilla and over scar tissue near incision where pt reports she has been feeling increased tenderness since her UE has been swelling more.  P/ROM to Rt shoulder into flexion, abd and D2 for increased stretch during MFR. This seemed to improve some by end of session with less palpable tightness at scarring and improved motion.    PATIENT EDUCATION:  Education details: Self MLD Person educated: Patient Education method: Explanation, Corporate treasurer and verbal cuing Education comprehension: verbalized understanding, returned demo and handout issued    HOME EXERCISE PROGRAM: Self MLD daily   ASSESSMENT:  CLINICAL IMPRESSION: Still awaiting garment.  Pt is ind with self MLD and does not  seem to have much benefit from inclinic MLD sessions so we will cancel next visit and hopefully she will have her sleeve for fit and use check and then DC.     OBJECTIVE IMPAIRMENTS decreased ROM, increased edema, and pain.  ACTIVITY LIMITATIONS carrying and lifting   PARTICIPATION LIMITATIONS:  none   PERSONAL FACTORS  none  are also affecting patient's functional outcome.    REHAB POTENTIAL: Excellent   CLINICAL DECISION MAKING: Stable/uncomplicated   EVALUATION COMPLEXITY: Low   GOALS: Goals reviewed with patient? Yes   SHORT TERM GOALS = LONG TERM GOALS   LONG TERM GOALS: Target date: 05/21/2022     Pt will obtain appropriate compression garments for long term management of edema. Baseline:  Goal status: INITIAL   2.  Pt will be independent in self MLD for long term management of edema.  Baseline:  Goal status: MET    3.  Pt will demonstrate improved R shoulder flexion ROM to 140 degrees to allow her to reach overhead and equal other side  Baseline: 131 Goal status: ONGOING       PLAN: PT FREQUENCY: 2x/week   PT DURATION: 4 weeks   PLANNED INTERVENTIONS: Therapeutic exercises, Patient/Family education, Orthotic/Fit training, Manual lymph drainage, Compression bandaging, Vasopneumatic device, and Manual therapy   PLAN FOR NEXT SESSION: get sleeve? Try wearing it?  Should be ready for DC.    Stark Bray, PT 05/10/2022, 10:43 AM              Cancer Rehab [phone] Start with circles near the neck, doing 5-10 on each side.       Deep Effective Breath   Standing, sitting, or laying down, place both hands on the belly. Take a deep breath IN, expanding the belly; then breath OUT, contracting the belly. Repeat __5__ times. Do __2-3__ sessions per day and before your self massage.  Axilla to Axilla - Sweep   On uninvolved side make 5 circles in the armpit, then pump _5__ times from involved armpit across chest to uninvolved armpit, making a pathway. Do _1__  time per day.  Copyright  VHI. All rights reserved.  Axilla to Inguinal Nodes - Sweep   On involved side, make 5 circles at groin at panty line, then pump _5__ times from armpit along side of trunk to outer hip, making your other pathway. Do __1_ time per day.  Copyright  VHI. All rights reserved.  Arm Posterior: Elbow to Shoulder - Sweep   Pump _5__ times from back of elbow to top of shoulder. Then inner to outer upper arm _5_ times, then outer arm again _5_ times. Then back to the pathways _2-3_ times. Do _1__ time per day.  Copyright  VHI. All rights reserved.  ARM: [place] Wrist to Elbow - Sweep   Pump or stationary circles _5__ times from wrist to elbow making sure to do both sides of the forearm. Then retrace your steps to the outer arm, and the pathways _2-3_ times each. Do _1__ time per day.  Copyright  VHI. All rights reserved.  ARM: Dorsum of Hand to Shoulder - Sweep   Pump or stationary circles _5__ times on back of hand including knuckle spaces and individual fingers if needed working up towards the wrist, then retrace all your steps working back up the forearm, doing both sides; upper outer arm and back to your pathways _2-3_ times each. Then do 5 circles again at uninvolved armpit and involved groin where you started! Good job!! Do __1_ time per day.

## 2022-05-14 ENCOUNTER — Ambulatory Visit: Payer: Medicare Other | Admitting: Cardiology

## 2022-05-14 ENCOUNTER — Encounter: Payer: Self-pay | Admitting: Cardiology

## 2022-05-14 VITALS — BP 108/62 | HR 71 | Ht 61.0 in | Wt 165.2 lb

## 2022-05-14 DIAGNOSIS — N184 Chronic kidney disease, stage 4 (severe): Secondary | ICD-10-CM

## 2022-05-14 DIAGNOSIS — E1169 Type 2 diabetes mellitus with other specified complication: Secondary | ICD-10-CM

## 2022-05-14 DIAGNOSIS — I2510 Atherosclerotic heart disease of native coronary artery without angina pectoris: Secondary | ICD-10-CM | POA: Diagnosis not present

## 2022-05-14 DIAGNOSIS — E669 Obesity, unspecified: Secondary | ICD-10-CM

## 2022-05-14 DIAGNOSIS — I2109 ST elevation (STEMI) myocardial infarction involving other coronary artery of anterior wall: Secondary | ICD-10-CM | POA: Diagnosis not present

## 2022-05-14 DIAGNOSIS — I10 Essential (primary) hypertension: Secondary | ICD-10-CM

## 2022-05-14 DIAGNOSIS — Z9861 Coronary angioplasty status: Secondary | ICD-10-CM | POA: Diagnosis not present

## 2022-05-14 DIAGNOSIS — E785 Hyperlipidemia, unspecified: Secondary | ICD-10-CM | POA: Diagnosis not present

## 2022-05-14 NOTE — Progress Notes (Signed)
Primary Care Provider: Antony Contras, MD Cardiologist: Glenetta Hew, MD Electrophysiologist: None Nephrologist: Dr. Dr. Carolynne Edouard  Clinic Note: No chief complaint on file.  ===================================  ASSESSMENT/PLAN   Problem List Items Addressed This Visit       Cardiology Problems   CAD S/P percutaneous coronary angioplasty - Primary (Chronic)    LAD PCI with DES in the setting of MI-almost 11 years out. Continues to do well with no recurrent symptoms of angina.  Plan: Stable dose of carvedilol 12.5 mg twice daily on rosuvastatin 20 mg daily. Not on ACE Dr. Marrion Coy because of renal insufficiency.  (Stopped by nephrology) On maintenance clopidogrel 75 mg daily-okay to hold for procedures. 5 days for minor procedures, 7 days per hydration procedures-notably spinal/neurologic procedures Restart when safe postop.      Relevant Orders   EKG 12-Lead (Completed)   H/O Anterior STEMI:  Proximal LAD  insertion of a 3.5x24 mm Promus element DES (08/2011) (Chronic)    Please significant anterior my complicated by VF arrest with CPR and ROSC.  Found LAD lesion treated PCI.  Not unexpectedly her EF is down little bit with anterior hypokinesis, but remarkable recovery and doing well with no major issues since.  Has been stable with no recurrent angina or heart failure symptoms despite having mildly reduced EF of 40 to 45%.      Essential hypertension (Chronic)    Blood pressure looks) good on carvedilol at current dose.  Mood stable heart rate of 71 bpm, I think were okay with her not being on ARB-apparently while on ARB/ACE I pressures were lower.  No longer on diuretic for RAAS inhibitor.      Relevant Orders   EKG 12-Lead (Completed)   Hyperlipidemia associated with type 2 diabetes mellitus (HCC) (Chronic)    Recent lipid panel looks great.  LDL is stable at 43 well within goal.  Triglycerides borderline but still relatively controlled.  Probably related  to glycemic control.  Plan: Continue combination of rosuvastatin 20 mg daily (which she is tolerating well without myalgias) in conjunction with ezetimibe 10 mg daily.  A1c was 5.6.  No longer on meds.        Other   Obesity (BMI 30-39.9) (Chronic)    Still losing weight.  13 pounds down from last year's visit. She is still sedentary, but apparently just does not have as good an appetite.      Chronic kidney disease, stage 4 (severe) (HCC) (Chronic)    Nonoliguric.  Stable.  Creatinine 1.99 on most recent check.  Will defer to nephrology-but not sure if she still needs to be off of ARB.  Also would consider the possibility of SGLT2 inhibitor if indicated.      ===================================  HPI:    Samantha Mcbride is a 71 y.o. female with a PMH below who presents today for annual follow-up as well as preop evaluation. She is being seen today at the request of Antony Contras, MD.  [facility] 11//2012 CAD/Anterior STEMI (complicated by VT arrest-LAD PCI),  Mild-Moderate ICM (stable EF 40 to 45%-mid apical-anterior hypokinesis c/w prior LAD infarct))  CKD 4,  HTN, HLD, . RA  H/o of Breast CA   [name] was last seen on May 03, 2021: She was doing well from a cardiac viewpoint.  Very happy that she completed her breast cancer with treatment.  Starting to feel more like her self.  No major complaints with exception of not liking the heat summertime.  I become a bit sedentary because of the hot weather-prefers to stay inside in the air conditioning..  Her knee was also hurting quite a bit and she is not able to walk as much.  Uses a shopping cart when doing grocery shopping which helps with stability.  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 Carolynne Edouard had stopped ACE-I (? Hpyotension);   Recent Hospitalizations: None  Reviewed   CV studies:    The following studies were reviewed today: (if available, images/films reviewed: From Epic Chart or Care Everywhere) None:   Interval History:   Vercie Pokorny returns today overall still doing well from cardiac standpoint.  She just mostly notes feeling fatigued because her exercise tolerance is not like it used to be.  Currently she has been bothered by back pain with sciatica down the left leg.  This to the point where she really has a hard time walking.  She is hopeful he can have an injection later this week.  She actually denies any chest pain pressure or dyspnea with rest exertion.  She does not really recall having much in the way of any true symptoms when she had her MI, but she is not having any exertional dyspnea, PND orthopnea.  She just has her same baseline exertional dyspnea and fatigue/exercise intolerance.  She is just not as active as she had been.  She went from having knee pain to now having this back pain with radiculopathy.  Hoping that she can get some relief with this planned procedure.  She does want to get back to walking, but also does not like being outside in the heat.  We talked about finding a place where she can go to an air conditioned location such as a grocery store or [employer] to walk using a shopping cart for balance.  CV Review of Symptoms (Summary) Cardiovascular ROS: positive for - dyspnea on exertion, edema, shortness of breath, and exertional dyspnea and baseline dyspnea from deconditioning, obesity-no change from baseline.  Trivial end of day swelling that goes down when she puts her feet up.  Has been worse-more in the left leg since she has been having the sciatica related radiculopathy. negative for - chest pain, irregular heartbeat, orthopnea, palpitations, paroxysmal nocturnal dyspnea, rapid heart rate, or lightheadedness, dizziness or wooziness, syncope/near syncope or TIA/amaurosis fugax, claudication.  REVIEWED OF SYSTEMS    Review of Systems  Constitutional:  Positive for malaise/fatigue (She has become sedentary because of different musculoskeletal issues.) and weight loss (Less appetite than before.).  HENT:  Negative for congestion and nosebleeds.   Respiratory:  Positive for cough (Occasionally, but nothing significant) and shortness of breath (Per HPI). Negative for sputum production and wheezing.   Cardiovascular:        Per HPI  Gastrointestinal:  Negative for blood in stool and melena.  Genitourinary:  Negative for flank pain and hematuria.  Musculoskeletal:  Positive for back pain (With sciatica down the left leg) and joint pain (Knees and hips). Negative for falls.  Neurological:  Positive for dizziness (Some mild vertigo), tingling (Left leg sciatica related radiculopathy) and focal weakness (Left leg from sciatica).  Psychiatric/Behavioral:  Negative for depression and memory loss. The patient is not nervous/anxious and does not have insomnia.    I have reviewed and (if needed) personally updated the patient's problem list, medications, allergies, past medical and surgical history, social and  family history.   PAST MEDICAL HISTORY   Past Medical History:  Diagnosis Date  . Anemia    iron deficiency anemia   . CAD S/P percutaneous coronary angioplasty 08/2011   Promus DES - mid LAD 3.5 mm x 24 mm (3.72 mm)   . Cancer ([place])   . Chronic kidney disease    stage 4 kidney disease per pt dx 02/2019  . Complication of anesthesia   . Dyslipidemia, goal LDL below 70     on statin, close to goal  . Family history of breast cancer   . Family history of skin cancer   . Former moderate cigarette smoker (10-19 per day)   . Glucose intolerance (impaired glucose tolerance)   . H/O thyroid nodule    Benign  . 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 --> Promus DES =>  EF 40 to 45% (similar to 2012) stable wall motion  normality (severe HK of midapical anterior apical wall-consistent with prior infarct).  GRII DD.  Normal valves. => Stable compared to 08/30/2011.  EF was still 40 to 45%.  . Hypertension   . Personal history of radiation therapy   . PONV (postoperative nausea and vomiting)    no issues with surgery on 05-11-2019  . Rheumatoid arthritis ([place])    managed on humira   . SOB (shortness of breath) on exertion    reports "its been that way since my heart attack " repots no recurrence of MI sx since that time     PAST SURGICAL HISTORY   Past Surgical History:  Procedure Laterality Date  . ABDOMINAL AORTAGRAM N/A 08/27/2011   Procedure: ABDOMINAL Maxcine Ham;  Surgeon: Troy Sine, MD;  Location: [facility] CATH LAB;  Service: Cardiovascular;  Laterality: N/A;  . ANKLE SURGERY Right 1995   per pt ankle surgery here at [facility]   . APPENDECTOMY    . BREAST EXCISIONAL BIOPSY Left   . BREAST LUMPECTOMY Right 07/2020  . BREAST LUMPECTOMY WITH RADIOACTIVE SEED AND SENTINEL LYMPH NODE BIOPSY Right 08/05/2020   Procedure: RIGHT BREAST LUMPECTOMY WITH RADIOACTIVE SEED AND SENTINEL LYMPH NODE BIOPSY;  Surgeon: [name], MD;  Location: [facility];  Service: General;  Laterality: Right;  . CYSTOSCOPY WITH RETROGRADE PYELOGRAM, URETEROSCOPY AND STENT PLACEMENT Bilateral 05/11/2019   Procedure: CYSTOSCOPY WITH RETROGRADE PYELOGRAM,  AND STENT PLACEMENT;  Surgeon: Lucas Mallow, MD;  Location: [facility];  Service: Urology;  Laterality: Bilateral;  . CYSTOSCOPY/URETEROSCOPY/HOLMIUM LASER/STENT PLACEMENT Bilateral 05/25/2019   Procedure: CYSTOSCOPY BILATERAL URETEROSCOPY/HOLMIUM LASER/STENT PLACEMENT;  Surgeon: Lucas Mallow, MD;  Location: [facility];  Service: Urology;  Laterality: Bilateral;  . LEFT HEART CATHETERIZATION WITH CORONARY ANGIOGRAM N/A 08/27/2011   Procedure: LEFT HEART CATHETERIZATION WITH CORONARY ANGIOGRAM;  Surgeon: Troy Sine, MD;  Location: [facility] CATH LAB;  Service: Cardiovascular;;For anterior  STEMI/cardiac arrest -- 100% mid LAD occlusion  . PERCUTANEOUS CORONARY STENT INTERVENTION (PCI-S) N/A 08/27/2011   Procedure: PERCUTANEOUS CORONARY STENT INTERVENTION (PCI-S);  Surgeon: Troy Sine, MD;  Location: [facility] CATH LAB;  Service: Cardiovascular;;  mid LAD PCI --> Promus Element DES 3.5 mm at 24 mm (3.72 mm)  . TRANSTHORACIC ECHOCARDIOGRAM  08/2011   EF 40-45%, moderate at K. of mid and distal inferior septum and anterior apical myocardium. Grade 1 diastolic function. -- Followup echocardiogram to reassess his EF was denied by insurance company  . TRANSTHORACIC ECHOCARDIOGRAM  07/20/2020  EF 40 to 45% (similar to 2012) stable wall motion normality (severe HK of midapical anterior apical wall-consistent with prior infarct).  GRII DD.  Normal valves. => Stable compared to 08/30/2011.  EF was still 40 to 45%.    Immunization History  Administered Date(s) Administered  . Influenza Split 07/03/2013, 07/12/2014, 06/15/2017, 06/29/2020  . Influenza, High Dose Seasonal PF 06/15/2017, 05/30/2019  . Influenza, Quadrivalent, Recombinant, Inj, Pf 06/28/2021  . Influenza,inj,Quad PF,6+ Mos 06/25/2015  . Influenza,inj,quad, With Preservative 07/10/2021  . Influenza,trivalent, recombinat, inj, PF 06/12/2018  . PFIZER Comirnaty(Gray Top)Covid-19 Tri-Sucrose Vaccine 01/24/2021  . PFIZER(Purple Top)SARS-COV-2 Vaccination 11/20/2019, 12/15/2019, 06/14/2020  . Pension scheme manager 42yr & up 07/20/2021  . Pneumococcal Conjugate-13 05/13/2017  . Pneumococcal Polysaccharide-23 08/28/2011, 05/12/2019  . Tdap 09/24/2014  . Zoster, Live 03/29/2017, 09/23/2020, 12/29/2020    MEDICATIONS/ALLERGIES   Current Meds  Medication Sig  . acetaminophen (TYLENOL) 500 MG tablet Take 500 mg by mouth every 6 (six) hours as needed for moderate pain or headache.  . anastrozole (ARIMIDEX) 1 MG tablet Take 1 tablet (1 mg total) by mouth daily.  . Calcium Citrate-Vitamin D (CALCIUM + D PO) Take 1  tablet by mouth daily.  . carvedilol (COREG) 12.5 MG tablet Take 12.5 mg by mouth 2 (two) times daily.  . clopidogrel (PLAVIX) 75 MG tablet TAKE 1 TABLET BY MOUTH EVERY DAY  . COVID-19 mRNA bivalent vaccine, Pfizer, (PFIZER COVID-19 VAC BIVALENT) injection Inject into the muscle.  .Marland KitchenCOVID-19 mRNA vaccine, Pfizer, 30 MCG/0.3ML injection Inject into the muscle.  . diclofenac sodium (VOLTAREN) 1 % GEL Apply 2 g topically 4 (four) times daily as needed (pain).   .Marland KitchenDM-APAP-CPM (CORICIDIN HBP PO) Take 1 tablet by mouth daily as needed (allergies).  . ezetimibe (ZETIA) 10 MG tablet TAKE 1 TABLET BY MOUTH EVERY DAY  . gabapentin (NEURONTIN) 100 MG capsule TAKE 3 CAPSULES BY MOUTH AT BEDTIME.  . hydroxychloroquine (PLAQUENIL) 200 MG tablet Take 1 tablet (200 mg total) by mouth daily.  .Marland Kitchenleflunomide (ARAVA) 20 MG tablet Take 20 mg by mouth daily.  . Melatonin 5 MG CAPS Take 5 mg by mouth at bedtime.  . nitroGLYCERIN (NITROSTAT) 0.4 MG SL tablet PLACE 1 TABLET UNDER THE TONGUE EVERY 5 MINUTES AS NEEDED FOR CHEST PAIN.  .Marland KitchenOlopatadine HCl (PATADAY OP) Place 1 drop into both eyes daily as needed (allergies).  . penicillin v potassium (VEETID) 500 MG tablet Take 500 mg by mouth 4 (four) times daily.  . promethazine (PHENERGAN) 25 MG suppository   . rosuvastatin (CRESTOR) 20 MG tablet TAKE 1 TABLET BY MOUTH EVERY DAY  . tiZANidine (ZANAFLEX) 2 MG tablet Take 2 mg by mouth at bedtime.  . traMADol (ULTRAM) 50 MG tablet Take by mouth every 12 (twelve) hours as needed.  . traZODone (DESYREL) 100 MG tablet Take 100 mg by mouth at bedtime.   . triamcinolone acetonide (KENALOG-40) 40 MG/ML injection Take 2 mL by injection route.   No Known Allergies  SOCIAL HISTORY/FAMILY HISTORY   Reviewed in Epic:  Pertinent findings:  Social History   Tobacco Use  . Smoking status: Former    Packs/day: 1.00    Years: 40.00    Total pack years: 40.00    Types: Cigarettes    Quit date: 08/27/2011    Years since  quitting: 10.7  . Smokeless tobacco: Never  Substance Use Topics  . Alcohol use: No  . Drug use: No   Social History   Social History  Narrative   Single mother of one, grandmother of 2. She works for [employer] for [employer].   She quit smoking time of her MI in November 2012. Does not take alcohol.   She had been doing really well he exercises at least 3-4 days a week, but really get discouraged that she is not been able to drop weight.    OBJCTIVE -PE, EKG, labs   Wt Readings from Last 3 Encounters:  05/14/22 165 lb 3.2 oz (74.9 kg)  04/09/22 168 lb 8 oz (76.4 kg)  01/08/22 171 lb 2 oz (77.6 kg)  04/2021 - 187 LB  Physical Exam: BP 108/62   Pulse 71   Ht '5\' 1"'$  (1.549 m)   Wt 165 lb 3.2 oz (74.9 kg)   SpO2 97%   BMI 31.21 kg/m  Physical Exam Vitals reviewed.  Constitutional:      General: She is not in acute distress.    Appearance: Normal appearance. She is obese. She is not toxic-appearing. Ill appearance: Well-groomed.  Healthy-appearing.Marland Kitchen HENT:     Head: Normocephalic and atraumatic.  Neck:     Vascular: No carotid bruit or JVD.  Cardiovascular:     Rate and Rhythm: Normal rate and regular rhythm. No extrasystoles are present.    Chest Wall: PMI is not displaced.     Pulses: Normal pulses.     Heart sounds: S1 normal and S2 normal. Heart sounds are distant. No murmur heard.    No friction rub. No gallop.  Pulmonary:     Effort: Pulmonary effort is normal. No respiratory distress.     Breath sounds: Normal breath sounds. No wheezing, rhonchi or rales.  Chest:     Chest wall: No tenderness.  Musculoskeletal:        General: Swelling (Trivial ankle swelling.) and tenderness (Left knee with swelling;) present. Normal range of motion.     Cervical back: Normal range of motion and neck supple.  Skin:    General: Skin is warm and dry.  Neurological:     General: No focal deficit present.     Mental Status: She is alert and oriented to person,  place, and time. Mental status is at baseline.     Motor: No weakness.     Gait: Gait abnormal.  Psychiatric:        Mood and Affect: Mood normal.        Behavior: Behavior normal.        Thought Content: Thought content normal.        Judgment: Judgment normal.    Adult ECG Report  Rate: 71;  Rhythm: normal sinus rhythm and low voltage.  Septal MI, age-indeterminate. ; Normal axis, intervals and durations.  Narrative Interpretation: Stable  Recent Labs: Reviewed Lab Results  Component Value Date   CHOL 112 04/24/2022   HDL 43 04/24/2022   LDLCALC 43 04/24/2022   TRIG 155 (H) 04/24/2022   CHOLHDL 2.6 04/24/2022   Lab Results  Component Value Date   CREATININE 1.99 (H) 04/24/2022   BUN 17 04/24/2022   NA 144 04/24/2022   K 3.7 04/24/2022   CL 106 04/24/2022   CO2 23 04/24/2022      Latest Ref Rng & Units 01/22/2022    1:16 PM 07/28/2020   11:00 AM 05/22/2019    8:59 AM  CBC  WBC 4.0 - 10.5 K/uL 7.8  8.8  9.9   Hemoglobin 12.0 - 15.0 g/dL 10.1  11.3  9.6   Hematocrit  36.0 - 46.0 % 32.6  36.5  31.0   Platelets 150 - 400 K/uL 161  222  251     Lab Results  Component Value Date   HGBA1C 5.6 08/07/2013   No results found for: "TSH"  ================================================== I spent a total of 16 minutes with the patient spent in direct patient consultation.  Additional time spent with chart review  / charting (studies, outside notes, etc): 16 min Total Time: 32 min  Current medicines are reviewed at length with the patient today.  (+/- concerns) none; Not sure if she has preop for her procedure. => Okay to hold Plavix 7 days prior to back ejection.  Would probably wait another 2 days before restarting.  Notice: This dictation was prepared with Dragon dictation along with smart phrase technology. Any transcriptional errors that result from this process are unintentional and may not be corrected upon review.  Studies Ordered:   Orders Placed This Encounter   Procedures  . EKG 12-Lead   No orders of the defined types were placed in this encounter.   Patient Instructions / Medication Changes & Studies & Tests Ordered   Patient Instructions  Medication Instructions:  No changes   *If you need a refill on your cardiac medications before your next appointment, please call your pharmacy*   Lab Work: Not needed    Testing/Procedures: Not needed   Follow-Up: At [facility], you and your health needs are our priority.  As part of our continuing mission to provide you with exceptional heart care, we have created designated Provider Care Teams.  These Care Teams include your primary Cardiologist (physician) and Advanced Practice Providers (APPs -  Physician Assistants and Nurse Practitioners) who all work together to provide you with the care you need, when you need it.     Your next appointment:   12 month(s)  The format for your next appointment:   In Person  Provider:   Glenetta Hew, MD    Other Instructions  Okay  to hold plavix if you need to get injection for your back      Delane Ginger, M.D., M.S. Interventional Cardiologist  [facility]  Pager # [phone] Phone # [phone] [address] [place], [place] [address]   Thank you for choosing [facility] [place]!!

## 2022-05-14 NOTE — Patient Instructions (Signed)
Medication Instructions:  No changes   *If you need a refill on your cardiac medications before your next appointment, please call your pharmacy*   Lab Work: Not needed    Testing/Procedures: Not needed   Follow-Up: At [facility], you and your health needs are our priority.  As part of our continuing mission to provide you with exceptional heart care, we have created designated Provider Care Teams.  These Care Teams include your primary Cardiologist (physician) and Advanced Practice Providers (APPs -  Physician Assistants and Nurse Practitioners) who all work together to provide you with the care you need, when you need it.     Your next appointment:   12 month(s)  The format for your next appointment:   In Person  Provider:   Glenetta Hew, MD    Other Instructions  Okay  to hold plavix if you need to get injection for your back

## 2022-05-15 ENCOUNTER — Encounter: Payer: Medicare Other | Admitting: Rehabilitation

## 2022-05-16 DIAGNOSIS — M5416 Radiculopathy, lumbar region: Secondary | ICD-10-CM | POA: Diagnosis not present

## 2022-05-16 DIAGNOSIS — M5136 Other intervertebral disc degeneration, lumbar region: Secondary | ICD-10-CM | POA: Diagnosis not present

## 2022-05-17 ENCOUNTER — Ambulatory Visit: Payer: Medicare Other | Attending: Hematology and Oncology | Admitting: Rehabilitation

## 2022-05-17 ENCOUNTER — Encounter: Payer: Self-pay | Admitting: Rehabilitation

## 2022-05-17 DIAGNOSIS — M79643 Pain in unspecified hand: Secondary | ICD-10-CM | POA: Diagnosis not present

## 2022-05-17 DIAGNOSIS — D649 Anemia, unspecified: Secondary | ICD-10-CM | POA: Diagnosis not present

## 2022-05-17 DIAGNOSIS — M25611 Stiffness of right shoulder, not elsewhere classified: Secondary | ICD-10-CM | POA: Insufficient documentation

## 2022-05-17 DIAGNOSIS — M1990 Unspecified osteoarthritis, unspecified site: Secondary | ICD-10-CM | POA: Diagnosis not present

## 2022-05-17 DIAGNOSIS — R600 Localized edema: Secondary | ICD-10-CM | POA: Insufficient documentation

## 2022-05-17 DIAGNOSIS — M810 Age-related osteoporosis without current pathological fracture: Secondary | ICD-10-CM | POA: Diagnosis not present

## 2022-05-17 DIAGNOSIS — N1832 Chronic kidney disease, stage 3b: Secondary | ICD-10-CM | POA: Diagnosis not present

## 2022-05-17 DIAGNOSIS — Z483 Aftercare following surgery for neoplasm: Secondary | ICD-10-CM | POA: Insufficient documentation

## 2022-05-17 DIAGNOSIS — M0579 Rheumatoid arthritis with rheumatoid factor of multiple sites without organ or systems involvement: Secondary | ICD-10-CM | POA: Diagnosis not present

## 2022-05-17 DIAGNOSIS — M549 Dorsalgia, unspecified: Secondary | ICD-10-CM | POA: Diagnosis not present

## 2022-05-17 DIAGNOSIS — Z79899 Other long term (current) drug therapy: Secondary | ICD-10-CM | POA: Diagnosis not present

## 2022-05-17 NOTE — Therapy (Signed)
OUTPATIENT PHYSICAL THERAPY TREATMENT NOTE   Patient Name: Samantha Mcbride MRN: [id number] DOB:November 11, 1950, 71 y.o., female 38 Date: 05/17/2022  PCP: Antony Contras REFERRING PROVIDER: Nicholas Lose, MD  END OF SESSION:   PT End of Session - 05/17/22 1703     Visit Number 5    Number of Visits 9    Date for PT Re-Evaluation 05/21/22    PT Start Time 52    PT Stop Time 1238    PT Time Calculation (min) 38 min    Activity Tolerance Patient tolerated treatment well    Behavior During Therapy [facility] for tasks assessed/performed               Past Medical History:  Diagnosis Date   Anemia    iron deficiency anemia    CAD S/P percutaneous coronary angioplasty 08/2011   Promus DES - mid LAD 3.5 mm x 24 mm (3.72 mm)    Cancer (HCC)    Chronic kidney disease    stage 4 kidney disease per pt dx 04/8675   Complication of anesthesia    Dyslipidemia, goal LDL below 70     on statin, close to goal   Family history of breast cancer    Family history of skin cancer    Former moderate cigarette smoker (10-19 per day)    Glucose intolerance (impaired glucose tolerance)    H/O thyroid nodule    Benign   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 --> Promus DES =>  EF 40 to 45% (similar to 2012) stable wall motion normality (severe HK of midapical anterior apical wall-consistent with prior infarct).  GRII DD.  Normal valves. => Stable compared to 08/30/2011.  EF was still 40 to 45%.   Hypertension    Personal history of radiation therapy    PONV (postoperative nausea and vomiting)    no issues with surgery on 05-11-2019   Rheumatoid arthritis ([place])    managed on humira    SOB (shortness of breath) on exertion    reports "its been that way since my heart attack " repots no recurrence of MI sx since that time    Past Surgical History:  Procedure Laterality Date   ABDOMINAL AORTAGRAM N/A 08/27/2011    Procedure: ABDOMINAL Maxcine Ham;  Surgeon: Troy Sine, MD;  Location: [facility] CATH LAB;  Service: Cardiovascular;  Laterality: N/A;   ANKLE SURGERY Right 1995   per pt ankle surgery here at [place] EXCISIONAL BIOPSY Left    BREAST LUMPECTOMY Right 07/2020   BREAST LUMPECTOMY WITH RADIOACTIVE SEED AND SENTINEL LYMPH NODE BIOPSY Right 08/05/2020   Procedure: RIGHT BREAST LUMPECTOMY WITH RADIOACTIVE SEED AND SENTINEL LYMPH NODE BIOPSY;  Surgeon: [name], MD;  Location: [place];  Service: General;  Laterality: Right;   CYSTOSCOPY WITH RETROGRADE PYELOGRAM, URETEROSCOPY AND STENT PLACEMENT Bilateral 05/11/2019   Procedure: CYSTOSCOPY WITH RETROGRADE PYELOGRAM,  AND STENT PLACEMENT;  Surgeon: Lucas Mallow, MD;  Location: [facility] ORS;  Service: Urology;  Laterality: Bilateral;   CYSTOSCOPY/URETEROSCOPY/HOLMIUM LASER/STENT PLACEMENT Bilateral 05/25/2019   Procedure: CYSTOSCOPY BILATERAL URETEROSCOPY/HOLMIUM LASER/STENT PLACEMENT;  Surgeon: Lucas Mallow, MD;  Location: [facility];  Service: Urology;  Laterality: Bilateral;   LEFT HEART CATHETERIZATION WITH CORONARY ANGIOGRAM N/A 08/27/2011   Procedure: LEFT HEART CATHETERIZATION WITH CORONARY ANGIOGRAM;  Surgeon: Troy Sine, MD;  Location: [facility] CATH LAB;  Service: Cardiovascular;;For anterior STEMI/cardiac arrest -- 100% mid LAD occlusion   PERCUTANEOUS CORONARY STENT INTERVENTION (PCI-S) N/A 08/27/2011   Procedure: PERCUTANEOUS CORONARY STENT INTERVENTION (PCI-S);  Surgeon: Troy Sine, MD;  Location: [facility] CATH LAB;  Service: Cardiovascular;;  mid LAD PCI --> Promus Element DES 3.5 mm at 24 mm (3.72 mm)   TRANSTHORACIC ECHOCARDIOGRAM  08/2011   EF 40-45%, moderate at K. of mid and distal inferior septum and anterior apical myocardium. Grade 1 diastolic function. -- Followup echocardiogram to reassess his EF was denied by insurance company   TRANSTHORACIC ECHOCARDIOGRAM  07/20/2020    EF 40 to 45% (similar to 2012)  stable wall motion normality (severe HK of midapical anterior apical wall-consistent with prior infarct).  GRII DD.  Normal valves. => Stable compared to 08/30/2011.  EF was still 40 to 45%.   Patient Active Problem List   Diagnosis Date Noted   Breast cancer ([place]) 10/03/2021   Osteoarthritis 10/03/2021   Osteoporosis 07/10/2021   Thoracic aortic atherosclerosis (HCC)-seen on CT 05/11/2021   COPD (chronic obstructive pulmonary disease) with emphysema ([place]) noted on CT 05/11/2021   Age-related osteoporosis without current pathological fracture 02/02/2021   Chronic kidney disease, stage 4 (severe) ([place]) 02/02/2021   Insomnia 02/02/2021   Localized, primary osteoarthritis of hand 02/02/2021   Osteopenia 02/02/2021   Restless legs 02/02/2021   Rheumatoid arthritis ([place]) 02/02/2021   Sciatica 02/02/2021   Vitamin D deficiency 02/02/2021   Genetic testing 07/15/2020   Preoperative cardiovascular examination 07/11/2020   Family history of breast cancer    Family history of skin cancer    Malignant neoplasm of upper-outer quadrant of right breast in female, estrogen receptor positive ([place]) 06/15/2020   Pain in left knee 05/28/2019   Ureteral calculus 05/11/2019   Obesity (BMI 30-39.9) 07/18/2013   Essential hypertension    Hyperlipidemia associated with type 2 diabetes mellitus ([place]) 07/16/2013   Glucose intolerance (impaired glucose tolerance) 07/16/2013   Ventricular tachycardia, sustained; Peri-infarct.  No further episodes 08/28/2011   Hypokalemia 08/27/2011   H/O Anterior STEMI:  Proximal LAD  insertion of a 3.5x24 mm Promus element DES (08/2011) 08/27/2011   CAD S/P percutaneous coronary angioplasty 08/16/2011    REFERRING DIAG: C50.411,Z17.0 (ICD-10-CM) - Malignant neoplasm of upper-outer quadrant of right breast in female, estrogen receptor positive ([place])  THERAPY DIAG:  Localized edema  Stiffness of right shoulder, not elsewhere classified  Aftercare following surgery for  neoplasm  Rationale for Evaluation and Treatment Rehabilitation  PERTINENT HISTORY: Rt breast lumpectomy on 08/05/20 with 2 lymph nodes removed both negative.  Completed radiation.  On Anastrozole x 10/28/20 tolerating well   PRECAUTIONS: Other: at risk of lymphedema RUE  SUBJECTIVE:  I got the sleeve.  I think it is tight  PAIN:  Are you having pain? No  OBJECTIVE: (objective measures completed at initial evaluation unless otherwise dated)  PALPATION: Fullness in R upper arm, thick per palpation when compared to L side despite circumferences being within 2 cm the swelling can be easily seen   UPPER EXTREMITY AROM/PROM:   A/PROM RIGHT   eval    Shoulder extension 71  Shoulder flexion 150  Shoulder abduction 177  Shoulder internal rotation 60  Shoulder external rotation 77                          (Blank rows = not tested)   A/PROM LEFT   eval  Shoulder extension 62  Shoulder flexion 160  Shoulder abduction 168  Shoulder internal rotation 58  Shoulder external rotation 75                          (Blank rows = not tested)    LYMPHEDEMA ASSESSMENTS:   LANDMARK RIGHT  Eval 04/23/22 05/08/22  15 cm proximal to olecranon process 34.5 34.5  10 cm proximal to olecranon process 35.1 34.9  Olecranon process 25.9 25.5  10 cm proximal to ulnar styloid process 21.7 22  Just proximal to ulnar styloid process 17 17  Across hand at thumb web space 18.9 18.5  At base of 2nd digit 6.8 6.7  (Blank rows = not tested)   LANDMARK LEFT  eval  15 cm proximal to olecranon process 33.8  10 cm proximal to olecranon process 33  Olecranon process 26.4  10 cm proximal to ulnar styloid process 21.2  Just proximal to ulnar styloid process 16.5  Across hand at thumb web space 18.7  At base of 2nd digit 6.8  (Blank rows = not tested)      TODAY'S TREATMENT  05/17/22 Assessed fit of new sleeve which fits well and does not seem too tight at the wrist but we decided to order a bella lite size  medium regular instead as pt may have too much compression on the UE due to having no actual lymphedema.  Pt was given order and return information and will hopefully pop in to check fit as able.   05/10/22 Manual lymph drainage:  In Supine to Rt UE: Short neck, 5 diaphragmatic breaths, Lt axillary and Rt inguinal nodes, then Rt UE working from proximal to distal then retracing all steps back to lymph nodes  P/ROM to Rt shoulder into flexion, abd and D2 for increased stretch during MFR.   05/08/22 Manual Therapy Ordered pt a bella strong size 5 regular in clinic using pt credit card Manual lymph drainage:  In Supine to Rt UE: Short neck, 5 diaphragmatic breaths, Lt axillary and Rt inguinal nodes, then Rt UE working from proximal to distal then retracing all steps back to lymph nodes  MFR to Rt axilla and over scar tissue near incision where pt reports she has been feeling increased tenderness since her UE has been swelling more.  P/ROM to Rt shoulder into flexion, abd and D2 for increased stretch during MFR.     PATIENT EDUCATION:  Education details: Self MLD Person educated: Patient Education method: Explanation, tactile and verbal cuing Education comprehension: verbalized understanding, returned demo and handout issued    HOME EXERCISE PROGRAM: Self MLD daily   ASSESSMENT:  CLINICAL IMPRESSION: Has garment but may prefer a lighter sleeve due to feeling like it is too strong despite being well fighting and 15-20.     OBJECTIVE IMPAIRMENTS decreased ROM, increased edema, and pain.    ACTIVITY LIMITATIONS carrying and lifting   PARTICIPATION LIMITATIONS:  none   PERSONAL FACTORS  none  are also affecting patient's functional outcome.    REHAB POTENTIAL: Excellent   CLINICAL DECISION MAKING: Stable/uncomplicated   EVALUATION COMPLEXITY: Low   GOALS: Goals reviewed with patient? Yes   SHORT TERM GOALS = LONG TERM GOALS   LONG TERM GOALS: Target date: 05/21/2022     Pt will  obtain appropriate compression garments for long term management of edema. Baseline:  Goal status: INITIAL   2.  Pt will be independent in self MLD for long term management of edema.  Baseline:  Goal  status: MET    3.  Pt will demonstrate improved R shoulder flexion ROM to 140 degrees to allow her to reach overhead and equal other side  Baseline: 131 Goal status: ONGOING       PLAN: PT FREQUENCY: 2x/week   PT DURATION: 4 weeks   PLANNED INTERVENTIONS: Therapeutic exercises, Patient/Family education, Orthotic/Fit training, Manual lymph drainage, Compression bandaging, Vasopneumatic device, and Manual therapy   PLAN FOR NEXT SESSION: get sleeve? Try wearing it?  Should be ready for DC.    Stark Bray, PT 05/17/2022, 5:05 PM              Cancer Rehab [phone] Start with circles near the neck, doing 5-10 on each side.       Deep Effective Breath   Standing, sitting, or laying down, place both hands on the belly. Take a deep breath IN, expanding the belly; then breath OUT, contracting the belly. Repeat __5__ times. Do __2-3__ sessions per day and before your self massage.  Axilla to Axilla - Sweep   On uninvolved side make 5 circles in the armpit, then pump _5__ times from involved armpit across chest to uninvolved armpit, making a pathway. Do _1__ time per day.  Copyright  VHI. All rights reserved.  Axilla to Inguinal Nodes - Sweep   On involved side, make 5 circles at groin at panty line, then pump _5__ times from armpit along side of trunk to outer hip, making your other pathway. Do __1_ time per day.  Copyright  VHI. All rights reserved.  Arm Posterior: Elbow to Shoulder - Sweep   Pump _5__ times from back of elbow to top of shoulder. Then inner to outer upper arm _5_ times, then outer arm again _5_ times. Then back to the pathways _2-3_ times. Do _1__ time per day.  Copyright  VHI. All rights reserved.  ARM: Volar Wrist to Elbow - Sweep   Pump or  stationary circles _5__ times from wrist to elbow making sure to do both sides of the forearm. Then retrace your steps to the outer arm, and the pathways _2-3_ times each. Do _1__ time per day.  Copyright  VHI. All rights reserved.  ARM: Dorsum of Hand to Shoulder - Sweep   Pump or stationary circles _5__ times on back of hand including knuckle spaces and individual fingers if needed working up towards the wrist, then retrace all your steps working back up the forearm, doing both sides; upper outer arm and back to your pathways _2-3_ times each. Then do 5 circles again at uninvolved armpit and involved groin where you started! Good job!! Do __1_ time per day.

## 2022-05-18 NOTE — Assessment & Plan Note (Signed)
Please significant anterior my complicated by VF arrest with CPR and ROSC.  Found LAD lesion treated PCI.  Not unexpectedly her EF is down little bit with anterior hypokinesis, but remarkable recovery and doing well with no major issues since.  Has been stable with no recurrent angina or heart failure symptoms despite having mildly reduced EF of 40 to 45%.

## 2022-05-18 NOTE — Assessment & Plan Note (Signed)
Recent lipid panel looks great.  LDL is stable at 43 well within goal.  Triglycerides borderline but still relatively controlled.  Probably related to glycemic control.  Plan: Continue combination of rosuvastatin 20 mg daily (which she is tolerating well without myalgias) in conjunction with ezetimibe 10 mg daily.  A1c was 5.6.  No longer on meds.

## 2022-05-18 NOTE — Assessment & Plan Note (Addendum)
LAD PCI with DES in the setting of MI-almost 11 years out. Continues to do well with no recurrent symptoms of angina.  Plan: Stable dose of carvedilol 12.5 mg twice daily on rosuvastatin 20 mg daily.  Not on ACE Dr. Marrion Coy because of renal insufficiency.  (Stopped by nephrology)  On maintenance clopidogrel 75 mg daily-okay to hold for procedures.  5 days for minor procedures, 7 days per hydration procedures-notably spinal/neurologic procedures  Restart when safe postop.

## 2022-05-18 NOTE — Assessment & Plan Note (Signed)
Still losing weight.  13 pounds down from last year's visit. She is still sedentary, but apparently just does not have as good an appetite.

## 2022-05-18 NOTE — Assessment & Plan Note (Signed)
Nonoliguric.  Stable.  Creatinine 1.99 on most recent check.  Will defer to nephrology-but not sure if she still needs to be off of ARB.  Also would consider the possibility of SGLT2 inhibitor if indicated.

## 2022-05-18 NOTE — Assessment & Plan Note (Signed)
Blood pressure looks) good on carvedilol at current dose.  Mood stable heart rate of 71 bpm, I think were okay with her not being on ARB-apparently while on ARB/ACE I pressures were lower.  No longer on diuretic for RAAS inhibitor.

## 2022-05-22 ENCOUNTER — Ambulatory Visit: Payer: Medicare Other

## 2022-05-24 ENCOUNTER — Ambulatory Visit: Payer: Medicare Other

## 2022-05-28 DIAGNOSIS — M5416 Radiculopathy, lumbar region: Secondary | ICD-10-CM | POA: Diagnosis not present

## 2022-06-04 ENCOUNTER — Other Ambulatory Visit: Payer: Self-pay | Admitting: Cardiology

## 2022-06-11 ENCOUNTER — Ambulatory Visit: Payer: Medicare Other | Admitting: Hematology and Oncology

## 2022-06-11 ENCOUNTER — Ambulatory Visit: Payer: Medicare Other

## 2022-06-11 ENCOUNTER — Other Ambulatory Visit: Payer: Medicare Other

## 2022-06-28 DIAGNOSIS — H2511 Age-related nuclear cataract, right eye: Secondary | ICD-10-CM | POA: Diagnosis not present

## 2022-06-28 DIAGNOSIS — H2513 Age-related nuclear cataract, bilateral: Secondary | ICD-10-CM | POA: Diagnosis not present

## 2022-06-28 DIAGNOSIS — M069 Rheumatoid arthritis, unspecified: Secondary | ICD-10-CM | POA: Diagnosis not present

## 2022-06-28 DIAGNOSIS — I10 Essential (primary) hypertension: Secondary | ICD-10-CM | POA: Diagnosis not present

## 2022-06-28 DIAGNOSIS — Z79899 Other long term (current) drug therapy: Secondary | ICD-10-CM | POA: Diagnosis not present

## 2022-06-28 DIAGNOSIS — H25013 Cortical age-related cataract, bilateral: Secondary | ICD-10-CM | POA: Diagnosis not present

## 2022-07-13 ENCOUNTER — Other Ambulatory Visit: Payer: Self-pay | Admitting: Hematology and Oncology

## 2022-07-16 ENCOUNTER — Other Ambulatory Visit: Payer: Self-pay | Admitting: Cardiology

## 2022-07-18 NOTE — Progress Notes (Signed)
Patient Care Team: Antony Contras, MD as PCP - General (Family Medicine) Leonie Man, MD as PCP - Cardiology (Cardiology) Kyung Rudd, MD as Consulting Physician (Radiation Oncology) [name], MD as Consulting Physician (General Surgery) Nicholas Lose, MD as Consulting Physician (Hematology and Oncology)  DIAGNOSIS:  Encounter Diagnosis  Name Primary?   Malignant neoplasm of upper-outer quadrant of right breast in female, estrogen receptor positive ([place])     SUMMARY OF ONCOLOGIC HISTORY: Oncology History  Malignant neoplasm of upper-outer quadrant of right breast in female, estrogen receptor positive ([place])  06/02/2020 Initial Diagnosis   05/20/20 showed a 0.6cm asymmetry in the lateral right breast and a 0.5cm mass at the 12:30 position. Biopsy on 06/02/20 showed invasive ductal carcinoma, grade 1, HER-2 equivocal by IHC, negative by FISH, ER+ 95%, PR+ 95%, Ki67 10%   07/15/2020 Genetic Testing   Negative genetic testing on the common hereditary cancer panel.  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  The Common Hereditary Gene Panel offered by [facility] includes sequencing and/or deletion duplication testing of the following 48 genes: APC, ATM, AXIN2, BARD1, BMPR1A, BRCA1, BRCA2, BRIP1, CDH1, CDK4, CDKN2A (p14ARF), CDKN2A (p16INK4a), CHEK2, CTNNA1, DICER1, EPCAM (Deletion/duplication testing only), GREM1 (promoter region deletion/duplication testing only), KIT, MEN1, MLH1, MSH2, MSH3, MSH6, MUTYH, NBN, NF1, NHTL1, PALB2, PDGFRA, PMS2, POLD1, POLE, PTEN, RAD50, RAD51C, RAD51D, RNF43, SDHB, SDHC, SDHD, SMAD4, SMARCA4. STK11, TP53, TSC1, TSC2, and VHL.  The following genes were evaluated for sequence changes only: SDHA and HOXB13 c.251G>A variant only. The report date is 07/15/2020.   08/05/2020 Surgery   Right lumpectomy Samantha Mcbride) [id number]): IDC, grade 1, 0.4cm, clear margins, 2 right axillary lymph nodes negative for carcinoma   09/12/2020 - 10/12/2020 Radiation Therapy   The patient  initially received a dose of 42.56 Gy in 16 fractions to the breast using whole-breast tangent fields. This was delivered using a 3-D conformal technique. The pt received a boost delivering an additional 8 Gy in 4 fractions using a electron boost with 60mV electrons. The total dose was 50.56 Gy.   09/2020 - 09/2025 Anti-estrogen oral therapy   Anastrozole     CHIEF COMPLIANT: Follow-up right breast cancer  INTERVAL HISTORY: Samantha Doddridgeis a 71y.o. with the above-mentioned currently on anastrozole. She presents to the clinic for a follow-up. She complains of Zometa is starting to mess with her teeth.  She reports that since she received Zometa she has had multiple teeth problems.  Several of the teeth are becoming loose and there appears to be some jaw/gum infection.  She has to have some dental work done.  ALLERGIES:  has No Known Allergies.  MEDICATIONS:  Current Outpatient Medications  Medication Sig Dispense Refill   amoxicillin (AMOXIL) 500 MG tablet Take 1 tablet (500 mg total) by mouth 2 (two) times daily. 28 tablet 0   acetaminophen (TYLENOL) 500 MG tablet Take 500 mg by mouth every 6 (six) hours as needed for moderate pain or headache.     Calcium Citrate-Vitamin D (CALCIUM + D PO) Take 1 tablet by mouth daily.     carvedilol (COREG) 12.5 MG tablet Take 12.5 mg by mouth 2 (two) times daily.     clopidogrel (PLAVIX) 75 MG tablet TAKE 1 TABLET BY MOUTH EVERY DAY 90 tablet 3   COVID-19 mRNA bivalent vaccine, Pfizer, (PFIZER COVID-19 VAC BIVALENT) injection Inject into the muscle. 0.3 mL 0   COVID-19 mRNA vaccine, Pfizer, 30 MCG/0.3ML injection Inject into the muscle. 0.3 mL 0  diclofenac sodium (VOLTAREN) 1 % GEL Apply 2 g topically 4 (four) times daily as needed (pain).      DM-APAP-CPM (CORICIDIN HBP PO) Take 1 tablet by mouth daily as needed (allergies).     ezetimibe (ZETIA) 10 MG tablet TAKE 1 TABLET BY MOUTH EVERY DAY 90 tablet 2   gabapentin (NEURONTIN) 100 MG capsule  TAKE 3 CAPSULES BY MOUTH AT BEDTIME. 270 capsule 3   hydroxychloroquine (PLAQUENIL) 200 MG tablet Take 1 tablet (200 mg total) by mouth daily.     leflunomide (ARAVA) 20 MG tablet Take 20 mg by mouth daily.     Melatonin 5 MG CAPS Take 5 mg by mouth at bedtime.     nitroGLYCERIN (NITROSTAT) 0.4 MG SL tablet PLACE 1 TABLET UNDER THE TONGUE EVERY 5 MINUTES AS NEEDED FOR CHEST PAIN. 25 tablet 4   Olopatadine HCl (PATADAY OP) Place 1 drop into both eyes daily as needed (allergies).     promethazine (PHENERGAN) 25 MG suppository      rosuvastatin (CRESTOR) 20 MG tablet TAKE 1 TABLET BY MOUTH EVERY DAY 90 tablet 3   tiZANidine (ZANAFLEX) 2 MG tablet Take 2 mg by mouth at bedtime.     traMADol (ULTRAM) 50 MG tablet Take by mouth every 12 (twelve) hours as needed.     traZODone (DESYREL) 100 MG tablet Take 100 mg by mouth at bedtime.      triamcinolone acetonide (KENALOG-40) 40 MG/ML injection Take 2 mL by injection route.     No current facility-administered medications for this visit.    PHYSICAL EXAMINATION: ECOG PERFORMANCE STATUS: 1 - Symptomatic but completely ambulatory  Vitals:   07/24/22 1001  BP: 139/76  Pulse: 82  Resp: 18  Temp: (!) 97.5 F (36.4 C)  SpO2: 100%   Filed Weights   07/24/22 1001  Weight: 163 lb (73.9 kg)      LABORATORY DATA:  I have reviewed the data as listed    Latest Ref Rng & Units 04/24/2022    8:53 AM 01/22/2022    1:16 PM 05/01/2021   10:20 AM  CMP  Glucose 70 - 99 mg/dL 100  104    BUN 8 - 27 mg/dL 17  24    Creatinine 0.57 - 1.00 mg/dL 1.99  1.77    Sodium 134 - 144 mmol/L 144  142    Potassium 3.5 - 5.2 mmol/L 3.7  4.2    Chloride 96 - 106 mmol/L 106  108    CO2 20 - 29 mmol/L 23  29    Calcium 8.7 - 10.3 mg/dL 8.5  9.0    Total Protein 6.0 - 8.5 g/dL 6.3  6.8  6.7   Total Bilirubin 0.0 - 1.2 mg/dL 0.3  0.3  0.2   Alkaline Phos 44 - 121 IU/L 76  70  91   AST 0 - 40 IU/L 9  11  17    ALT 0 - 32 IU/L 9  10  6      Lab Results  Component  Value Date   WBC 7.8 01/22/2022   HGB 10.1 (L) 01/22/2022   HCT 32.6 (L) 01/22/2022   MCV 93.7 01/22/2022   PLT 161 01/22/2022   NEUTROABS 5.7 01/22/2022    ASSESSMENT & PLAN:  Malignant neoplasm of upper-outer quadrant of right breast in female, estrogen receptor positive ([place]) 05/20/20 showed a 0.6cm asymmetry in the lateral right breast and a 0.5cm mass at the 12:30 position. Biopsy on 06/02/20 showed invasive ductal carcinoma,  grade 1, HER-2 equivocal by IHC, negative by FISH, ER+ 95%, PR+ 95%, Ki67 10%   08/05/2020:Right lumpectomy Samantha Mcbride): IDC, grade 1, 0.4cm, clear margins, 2 right axillary lymph nodes negative for carcinoma   Treatment plan: 1.  Adjuvant radiation therapy started 09/13/2020 2. followed by adjuvant antiestrogen therapy with anastrozole daily x5 years   Anastrozole toxicities: Because of osteoporosis and her inability to take bisphosphonate therapy (severe damage to the teeth), we decided to discontinue anastrozole therapy.  She had been on it for 2 years.   Osteoporosis: starting Prolia, calcium and Vit D Bone density 05/02/2022: T score -2.6: Zometa infusion caused her teeth to become loose and she had to have several teeth removed.  Therefore we will discontinue Zometa.  Gingivitis: I sent a prescription for amoxicillin.  Breast cancer surveillance: Mammogram 05/09/2022  : No mammographic evidence of malignancy.  Breast density category B   Return to clinic in 1 year for follow-up and surveillance.    No orders of the defined types were placed in this encounter.  The patient has a good understanding of the overall plan. she agrees with it. she will call with any problems that may develop before the next visit here. Total time spent: 30 mins including face to face time and time spent for planning, charting and co-ordination of care   Harriette Ohara, MD 07/24/22    I Gardiner Coins am scribing for Dr. Lindi Adie  I have reviewed the above documentation  for accuracy and completeness, and I agree with the above.

## 2022-07-23 ENCOUNTER — Ambulatory Visit: Payer: Medicare Other | Attending: Hematology and Oncology

## 2022-07-23 ENCOUNTER — Other Ambulatory Visit: Payer: Self-pay | Admitting: *Deleted

## 2022-07-23 VITALS — Wt 162.4 lb

## 2022-07-23 DIAGNOSIS — Z483 Aftercare following surgery for neoplasm: Secondary | ICD-10-CM | POA: Insufficient documentation

## 2022-07-23 DIAGNOSIS — C50411 Malignant neoplasm of upper-outer quadrant of right female breast: Secondary | ICD-10-CM

## 2022-07-23 NOTE — Therapy (Signed)
OUTPATIENT PHYSICAL THERAPY SOZO SCREENING NOTE   Patient Name: Samantha Mcbride MRN: [id number] DOB:October 04, 1951, 71 y.o., female Today's Date: 07/23/2022  PCP: Antony Contras, MD REFERRING PROVIDER: Nicholas Lose, MD   PT End of Session - 07/23/22 0919     Visit Number 5   # unchanged due to screen only   PT Start Time 0917    PT Stop Time 0921    PT Time Calculation (min) 4 min    Activity Tolerance Patient tolerated treatment well    Behavior During Therapy [facility] for tasks assessed/performed             Past Medical History:  Diagnosis Date   Anemia    iron deficiency anemia    CAD S/P percutaneous coronary angioplasty 08/2011   Promus DES - mid LAD 3.5 mm x 24 mm (3.72 mm)    Cancer (HCC)    Chronic kidney disease    stage 4 kidney disease per pt dx 03/9484   Complication of anesthesia    Dyslipidemia, goal LDL below 70     on statin, close to goal   Family history of breast cancer    Family history of skin cancer    Former moderate cigarette smoker (10-19 per day)    Glucose intolerance (impaired glucose tolerance)    H/O thyroid nodule    Benign   History 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 --> Promus DES =>  EF 40 to 45% (similar to 2012) stable wall motion normality (severe HK of midapical anterior apical wall-consistent with prior infarct).  GRII DD.  Normal valves. => Stable compared to 08/30/2011.  EF was still 40 to 45%.   Hypertension    Personal history of radiation therapy    PONV (postoperative nausea and vomiting)    no issues with surgery on 05-11-2019   Rheumatoid arthritis ([place])    managed on humira    SOB (shortness of breath) on exertion    reports "its been that way since my heart attack " repots no recurrence of MI sx since that time    Past Surgical History:  Procedure Laterality Date   ABDOMINAL AORTAGRAM N/A 08/27/2011   Procedure: ABDOMINAL Maxcine Ham;   Surgeon: Troy Sine, MD;  Location: [facility] CATH LAB;  Service: Cardiovascular;  Laterality: N/A;   ANKLE SURGERY Right 1995   per pt ankle surgery here at [place] EXCISIONAL BIOPSY Left    BREAST LUMPECTOMY Right 07/2020   BREAST LUMPECTOMY WITH RADIOACTIVE SEED AND SENTINEL LYMPH NODE BIOPSY Right 08/05/2020   Procedure: RIGHT BREAST LUMPECTOMY WITH RADIOACTIVE SEED AND SENTINEL LYMPH NODE BIOPSY;  Surgeon: [name], MD;  Location: [place];  Service: General;  Laterality: Right;   CYSTOSCOPY WITH RETROGRADE PYELOGRAM, URETEROSCOPY AND STENT PLACEMENT Bilateral 05/11/2019   Procedure: CYSTOSCOPY WITH RETROGRADE PYELOGRAM,  AND STENT PLACEMENT;  Surgeon: Lucas Mallow, MD;  Location: [facility];  Service: Urology;  Laterality: Bilateral;   CYSTOSCOPY/URETEROSCOPY/HOLMIUM LASER/STENT PLACEMENT Bilateral 05/25/2019   Procedure: CYSTOSCOPY BILATERAL URETEROSCOPY/HOLMIUM LASER/STENT PLACEMENT;  Surgeon: Lucas Mallow, MD;  Location: [facility];  Service: Urology;  Laterality: Bilateral;   LEFT HEART CATHETERIZATION WITH CORONARY ANGIOGRAM N/A 08/27/2011   Procedure: LEFT HEART CATHETERIZATION WITH CORONARY ANGIOGRAM;  Surgeon: Troy Sine, MD;  Location: [facility] CATH LAB;  Service: Cardiovascular;;For anterior STEMI/cardiac arrest -- 100% mid LAD occlusion  PERCUTANEOUS CORONARY STENT INTERVENTION (PCI-S) N/A 08/27/2011   Procedure: PERCUTANEOUS CORONARY STENT INTERVENTION (PCI-S);  Surgeon: Troy Sine, MD;  Location: [facility] CATH LAB;  Service: Cardiovascular;;  mid LAD PCI --> Promus Element DES 3.5 mm at 24 mm (3.72 mm)   TRANSTHORACIC ECHOCARDIOGRAM  08/2011   EF 40-45%, moderate at K. of mid and distal inferior septum and anterior apical myocardium. Grade 1 diastolic function. -- Followup echocardiogram to reassess his EF was denied by insurance company   TRANSTHORACIC ECHOCARDIOGRAM  07/20/2020    EF 40 to 45% (similar to 2012) stable wall motion normality (severe HK  of midapical anterior apical wall-consistent with prior infarct).  GRII DD.  Normal valves. => Stable compared to 08/30/2011.  EF was still 40 to 45%.   Patient Active Problem List   Diagnosis Date Noted   Breast cancer ([place]) 10/03/2021   Osteoarthritis 10/03/2021   Osteoporosis 07/10/2021   Thoracic aortic atherosclerosis (HCC)-seen on CT 05/11/2021   COPD (chronic obstructive pulmonary disease) with emphysema ([place]) noted on CT 05/11/2021   Age-related osteoporosis without current pathological fracture 02/02/2021   Chronic kidney disease, stage 4 (severe) ([place]) 02/02/2021   Insomnia 02/02/2021   Localized, primary osteoarthritis of hand 02/02/2021   Osteopenia 02/02/2021   Restless legs 02/02/2021   Rheumatoid arthritis ([place]) 02/02/2021   Sciatica 02/02/2021   Vitamin D deficiency 02/02/2021   Genetic testing 07/15/2020   Preoperative cardiovascular examination 07/11/2020   Family history of breast cancer    Family history of skin cancer    Malignant neoplasm of upper-outer quadrant of right breast in female, estrogen receptor positive ([place]) 06/15/2020   Pain in left knee 05/28/2019   Ureteral calculus 05/11/2019   Obesity (BMI 30-39.9) 07/18/2013   Essential hypertension    Hyperlipidemia associated with type 2 diabetes mellitus ([place]) 07/16/2013   Glucose intolerance (impaired glucose tolerance) 07/16/2013   Ventricular tachycardia, sustained; Peri-infarct.  No further episodes 08/28/2011   Hypokalemia 08/27/2011   H/O Anterior STEMI:  Proximal LAD  insertion of a 3.5x24 mm Promus element DES (08/2011) 08/27/2011   CAD S/P percutaneous coronary angioplasty 08/16/2011    REFERRING DIAG: right breast cancer at risk for lymphedema  THERAPY DIAG:  Aftercare following surgery for neoplasm  PERTINENT HISTORY: Rt breast lumpectomy on 08/05/20 with 2 lymph nodes removed both negative.  Completed radiation.  On Anastrozole x 10/28/20 tolerating well   PRECAUTIONS: right UE  Lymphedema risk, None  SUBJECTIVE: Pt returns for her final 3 month L-Dex screen.   PAIN:  Are you having pain? No  SOZO SCREENING: Patient was assessed today using the SOZO machine to determine the lymphedema index score. This was compared to her baseline score. It was determined that she is within the recommended range when compared to her baseline and no further action is needed at this time. She will continue SOZO screenings. These are done every 3 months for 2 years post operatively followed by every 6 months for 2 years, and then annually.   L-DEX FLOWSHEETS - 07/23/22 0900       L-DEX LYMPHEDEMA SCREENING   Measurement Type Unilateral    L-DEX MEASUREMENT EXTREMITY Upper Extremity    POSITION  Standing    DOMINANT SIDE Right    At Risk Side Right    BASELINE SCORE (UNILATERAL) 7.4    L-DEX SCORE (UNILATERAL) 3.6    VALUE CHANGE (UNILAT) -3.8               Otelia Limes,  PTA 07/23/2022, 9:22 AM

## 2022-07-24 ENCOUNTER — Inpatient Hospital Stay: Payer: Medicare Other

## 2022-07-24 ENCOUNTER — Inpatient Hospital Stay: Payer: Medicare Other | Attending: Hematology and Oncology

## 2022-07-24 ENCOUNTER — Inpatient Hospital Stay (HOSPITAL_BASED_OUTPATIENT_CLINIC_OR_DEPARTMENT_OTHER): Payer: Medicare Other | Admitting: Hematology and Oncology

## 2022-07-24 ENCOUNTER — Other Ambulatory Visit: Payer: Self-pay

## 2022-07-24 DIAGNOSIS — C50411 Malignant neoplasm of upper-outer quadrant of right female breast: Secondary | ICD-10-CM | POA: Diagnosis not present

## 2022-07-24 DIAGNOSIS — M810 Age-related osteoporosis without current pathological fracture: Secondary | ICD-10-CM | POA: Insufficient documentation

## 2022-07-24 DIAGNOSIS — Z79811 Long term (current) use of aromatase inhibitors: Secondary | ICD-10-CM | POA: Diagnosis not present

## 2022-07-24 DIAGNOSIS — Z923 Personal history of irradiation: Secondary | ICD-10-CM | POA: Insufficient documentation

## 2022-07-24 DIAGNOSIS — Z853 Personal history of malignant neoplasm of breast: Secondary | ICD-10-CM | POA: Diagnosis not present

## 2022-07-24 DIAGNOSIS — Z170 Estrogen receptor positive status [ER+]: Secondary | ICD-10-CM | POA: Diagnosis not present

## 2022-07-24 MED ORDER — AMOXICILLIN 500 MG PO TABS: 500.0000 mg | ORAL_TABLET | Freq: Two times a day (BID) | ORAL | 0 refills | Status: DC

## 2022-07-24 NOTE — Assessment & Plan Note (Addendum)
05/20/20 showed a 0.6cm asymmetry in the lateral right breast and a 0.5cm mass at the 12:30 position. 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  She had been on it for 2 years.  Osteoporosis: starting Prolia, calcium and Vit D Bone density 05/02/2022: T score -2.6: Zometa infusion caused her teeth to become loose and she had to have several teeth removed.  Therefore we will discontinue Zometa.  Gingivitis: I sent a prescription for amoxicillin.  Breast cancer surveillance: 1.  Breast exam: 07/24/2022: Benign 2. Mammogram 05/09/2022 with ultrasound: No mammographic evidence of malignancy.  Breast density category B  Return to clinic in 1 year for follow-up and surveillance.

## 2022-08-22 ENCOUNTER — Telehealth: Payer: Self-pay | Admitting: *Deleted

## 2022-08-22 DIAGNOSIS — N1832 Chronic kidney disease, stage 3b: Secondary | ICD-10-CM | POA: Diagnosis not present

## 2022-08-22 DIAGNOSIS — M1990 Unspecified osteoarthritis, unspecified site: Secondary | ICD-10-CM | POA: Diagnosis not present

## 2022-08-22 DIAGNOSIS — M549 Dorsalgia, unspecified: Secondary | ICD-10-CM | POA: Diagnosis not present

## 2022-08-22 DIAGNOSIS — M79643 Pain in unspecified hand: Secondary | ICD-10-CM | POA: Diagnosis not present

## 2022-08-22 DIAGNOSIS — M810 Age-related osteoporosis without current pathological fracture: Secondary | ICD-10-CM | POA: Diagnosis not present

## 2022-08-22 DIAGNOSIS — Z79899 Other long term (current) drug therapy: Secondary | ICD-10-CM | POA: Diagnosis not present

## 2022-08-22 DIAGNOSIS — D649 Anemia, unspecified: Secondary | ICD-10-CM | POA: Diagnosis not present

## 2022-08-22 DIAGNOSIS — M0579 Rheumatoid arthritis with rheumatoid factor of multiple sites without organ or systems involvement: Secondary | ICD-10-CM | POA: Diagnosis not present

## 2022-08-22 NOTE — Telephone Encounter (Signed)
   Pre-operative Risk Assessment    Patient Name: Samantha Mcbride  DOB: [birth year] MRN: [id number]      Request for Surgical Clearance    Procedure:   Cataract extraction w/ intraocular lens implantation of the right eye , followed by the left eye  Date of Surgery:   09/26/22  and 10/10/22                                Surgeon:  Dr Carolann Littler Surgeon's Group or Practice Name:   [facility] Phone number:  [phone] Fax number:  [phone]   Type of Clearance Requested:   - Medical  - Pharmacy:  Hold Clopidogrel (Plavix)     Type of Anesthesia:  Not Indicated   Additional requests/questions:  Please advise surgeon/provider what medications should be held.  Olin Pia   08/22/2022, 11:37 AM

## 2022-08-22 NOTE — Telephone Encounter (Signed)
   Patient Name: Samantha Mcbride  DOB: 02/15/1951 MRN: [id number]  Primary Cardiologist: Glenetta Hew, MD  Chart reviewed as part of pre-operative protocol coverage. Cataract extractions are recognized in guidelines as low risk surgeries that do not typically require specific preoperative testing or holding of blood thinner therapy. Therefore, given past medical history and time since last visit, based on ACC/AHA guidelines, Samantha Mcbride would be at acceptable risk for the planned procedure without further cardiovascular testing.   I will route this recommendation to the requesting party via Epic fax function and remove from pre-op pool.  Please call with questions.  Deberah Pelton, NP 08/22/2022, 1:18 PM

## 2022-09-05 DIAGNOSIS — M5416 Radiculopathy, lumbar region: Secondary | ICD-10-CM | POA: Diagnosis not present

## 2022-09-11 ENCOUNTER — Other Ambulatory Visit: Payer: Self-pay | Admitting: Cardiology

## 2022-09-21 ENCOUNTER — Telehealth: Payer: Self-pay | Admitting: Cardiology

## 2022-09-21 NOTE — Telephone Encounter (Signed)
I will re-fax to requesting office.

## 2022-09-21 NOTE — Telephone Encounter (Signed)
Samantha Mcbride is calling stating they never received the patient's clearance back. She is requesting it be faxed to the number listed on the clearance request. Please advise.

## 2022-09-26 DIAGNOSIS — H2511 Age-related nuclear cataract, right eye: Secondary | ICD-10-CM | POA: Diagnosis not present

## 2022-09-27 DIAGNOSIS — H2512 Age-related nuclear cataract, left eye: Secondary | ICD-10-CM | POA: Diagnosis not present

## 2022-10-10 DIAGNOSIS — H2512 Age-related nuclear cataract, left eye: Secondary | ICD-10-CM | POA: Diagnosis not present

## 2022-10-17 ENCOUNTER — Emergency Department (HOSPITAL_COMMUNITY): Payer: Medicare Other

## 2022-10-17 ENCOUNTER — Encounter (HOSPITAL_COMMUNITY): Payer: Self-pay

## 2022-10-17 ENCOUNTER — Other Ambulatory Visit: Payer: Self-pay

## 2022-10-17 ENCOUNTER — Inpatient Hospital Stay (HOSPITAL_COMMUNITY)
Admission: EM | Admit: 2022-10-17 | Discharge: 2022-10-27 | DRG: 481 | Disposition: A | Discharge status: Home Health | Payer: Medicare Other | Attending: Internal Medicine | Admitting: Internal Medicine

## 2022-10-17 DIAGNOSIS — E1122 Type 2 diabetes mellitus with diabetic chronic kidney disease: Secondary | ICD-10-CM | POA: Diagnosis not present

## 2022-10-17 DIAGNOSIS — Z853 Personal history of malignant neoplasm of breast: Secondary | ICD-10-CM

## 2022-10-17 DIAGNOSIS — C50411 Malignant neoplasm of upper-outer quadrant of right female breast: Secondary | ICD-10-CM

## 2022-10-17 DIAGNOSIS — N189 Chronic kidney disease, unspecified: Secondary | ICD-10-CM | POA: Diagnosis not present

## 2022-10-17 DIAGNOSIS — D509 Iron deficiency anemia, unspecified: Secondary | ICD-10-CM | POA: Diagnosis not present

## 2022-10-17 DIAGNOSIS — D62 Acute posthemorrhagic anemia: Secondary | ICD-10-CM | POA: Diagnosis not present

## 2022-10-17 DIAGNOSIS — Z8674 Personal history of sudden cardiac arrest: Secondary | ICD-10-CM

## 2022-10-17 DIAGNOSIS — G2581 Restless legs syndrome: Secondary | ICD-10-CM | POA: Diagnosis not present

## 2022-10-17 DIAGNOSIS — I10 Essential (primary) hypertension: Secondary | ICD-10-CM | POA: Diagnosis present

## 2022-10-17 DIAGNOSIS — J431 Panlobular emphysema: Secondary | ICD-10-CM

## 2022-10-17 DIAGNOSIS — S72002A Fracture of unspecified part of neck of left femur, initial encounter for closed fracture: Secondary | ICD-10-CM | POA: Diagnosis not present

## 2022-10-17 DIAGNOSIS — I252 Old myocardial infarction: Secondary | ICD-10-CM | POA: Diagnosis not present

## 2022-10-17 DIAGNOSIS — Z9861 Coronary angioplasty status: Secondary | ICD-10-CM

## 2022-10-17 DIAGNOSIS — I2510 Atherosclerotic heart disease of native coronary artery without angina pectoris: Secondary | ICD-10-CM

## 2022-10-17 DIAGNOSIS — Z923 Personal history of irradiation: Secondary | ICD-10-CM | POA: Diagnosis not present

## 2022-10-17 DIAGNOSIS — Z6830 Body mass index (BMI) 30.0-30.9, adult: Secondary | ICD-10-CM

## 2022-10-17 DIAGNOSIS — E785 Hyperlipidemia, unspecified: Secondary | ICD-10-CM | POA: Diagnosis not present

## 2022-10-17 DIAGNOSIS — Z79899 Other long term (current) drug therapy: Secondary | ICD-10-CM

## 2022-10-17 DIAGNOSIS — S72102A Unspecified trochanteric fracture of left femur, initial encounter for closed fracture: Secondary | ICD-10-CM | POA: Diagnosis not present

## 2022-10-17 DIAGNOSIS — D329 Benign neoplasm of meninges, unspecified: Secondary | ICD-10-CM | POA: Diagnosis not present

## 2022-10-17 DIAGNOSIS — S72142A Displaced intertrochanteric fracture of left femur, initial encounter for closed fracture: Secondary | ICD-10-CM | POA: Diagnosis not present

## 2022-10-17 DIAGNOSIS — I129 Hypertensive chronic kidney disease with stage 1 through stage 4 chronic kidney disease, or unspecified chronic kidney disease: Secondary | ICD-10-CM | POA: Diagnosis not present

## 2022-10-17 DIAGNOSIS — M069 Rheumatoid arthritis, unspecified: Secondary | ICD-10-CM | POA: Diagnosis present

## 2022-10-17 DIAGNOSIS — E876 Hypokalemia: Secondary | ICD-10-CM | POA: Diagnosis present

## 2022-10-17 DIAGNOSIS — M2170 Unequal limb length (acquired), unspecified site: Secondary | ICD-10-CM | POA: Diagnosis present

## 2022-10-17 DIAGNOSIS — Y92014 Private driveway to single-family (private) house as the place of occurrence of the external cause: Secondary | ICD-10-CM

## 2022-10-17 DIAGNOSIS — D631 Anemia in chronic kidney disease: Secondary | ICD-10-CM | POA: Diagnosis not present

## 2022-10-17 DIAGNOSIS — S0990XA Unspecified injury of head, initial encounter: Secondary | ICD-10-CM | POA: Diagnosis not present

## 2022-10-17 DIAGNOSIS — N184 Chronic kidney disease, stage 4 (severe): Secondary | ICD-10-CM | POA: Diagnosis present

## 2022-10-17 DIAGNOSIS — W19XXXA Unspecified fall, initial encounter: Secondary | ICD-10-CM

## 2022-10-17 DIAGNOSIS — M1612 Unilateral primary osteoarthritis, left hip: Secondary | ICD-10-CM | POA: Diagnosis not present

## 2022-10-17 DIAGNOSIS — S72009A Fracture of unspecified part of neck of unspecified femur, initial encounter for closed fracture: Secondary | ICD-10-CM | POA: Diagnosis not present

## 2022-10-17 DIAGNOSIS — S0003XA Contusion of scalp, initial encounter: Secondary | ICD-10-CM | POA: Diagnosis not present

## 2022-10-17 DIAGNOSIS — S72145A Nondisplaced intertrochanteric fracture of left femur, initial encounter for closed fracture: Secondary | ICD-10-CM | POA: Diagnosis not present

## 2022-10-17 DIAGNOSIS — Z955 Presence of coronary angioplasty implant and graft: Secondary | ICD-10-CM

## 2022-10-17 DIAGNOSIS — Z743 Need for continuous supervision: Secondary | ICD-10-CM | POA: Diagnosis not present

## 2022-10-17 DIAGNOSIS — W01198A Fall on same level from slipping, tripping and stumbling with subsequent striking against other object, initial encounter: Secondary | ICD-10-CM | POA: Diagnosis present

## 2022-10-17 DIAGNOSIS — Z87891 Personal history of nicotine dependence: Secondary | ICD-10-CM | POA: Diagnosis not present

## 2022-10-17 DIAGNOSIS — Z808 Family history of malignant neoplasm of other organs or systems: Secondary | ICD-10-CM

## 2022-10-17 DIAGNOSIS — E669 Obesity, unspecified: Secondary | ICD-10-CM | POA: Diagnosis present

## 2022-10-17 DIAGNOSIS — S3993XA Unspecified injury of pelvis, initial encounter: Secondary | ICD-10-CM | POA: Diagnosis not present

## 2022-10-17 DIAGNOSIS — E559 Vitamin D deficiency, unspecified: Secondary | ICD-10-CM | POA: Diagnosis not present

## 2022-10-17 DIAGNOSIS — Z170 Estrogen receptor positive status [ER+]: Secondary | ICD-10-CM | POA: Diagnosis not present

## 2022-10-17 DIAGNOSIS — E1169 Type 2 diabetes mellitus with other specified complication: Secondary | ICD-10-CM | POA: Diagnosis not present

## 2022-10-17 DIAGNOSIS — E041 Nontoxic single thyroid nodule: Secondary | ICD-10-CM | POA: Diagnosis present

## 2022-10-17 DIAGNOSIS — C50919 Malignant neoplasm of unspecified site of unspecified female breast: Secondary | ICD-10-CM | POA: Diagnosis not present

## 2022-10-17 DIAGNOSIS — Z043 Encounter for examination and observation following other accident: Secondary | ICD-10-CM | POA: Diagnosis not present

## 2022-10-17 DIAGNOSIS — M810 Age-related osteoporosis without current pathological fracture: Secondary | ICD-10-CM | POA: Diagnosis not present

## 2022-10-17 DIAGNOSIS — Z803 Family history of malignant neoplasm of breast: Secondary | ICD-10-CM

## 2022-10-17 DIAGNOSIS — Z9889 Other specified postprocedural states: Secondary | ICD-10-CM | POA: Diagnosis not present

## 2022-10-17 DIAGNOSIS — I2109 ST elevation (STEMI) myocardial infarction involving other coronary artery of anterior wall: Secondary | ICD-10-CM | POA: Diagnosis present

## 2022-10-17 DIAGNOSIS — J439 Emphysema, unspecified: Secondary | ICD-10-CM | POA: Diagnosis present

## 2022-10-17 DIAGNOSIS — Z7902 Long term (current) use of antithrombotics/antiplatelets: Secondary | ICD-10-CM

## 2022-10-17 DIAGNOSIS — M25522 Pain in left elbow: Secondary | ICD-10-CM | POA: Diagnosis not present

## 2022-10-17 DIAGNOSIS — S79922A Unspecified injury of left thigh, initial encounter: Secondary | ICD-10-CM | POA: Diagnosis not present

## 2022-10-17 DIAGNOSIS — R079 Chest pain, unspecified: Secondary | ICD-10-CM | POA: Diagnosis not present

## 2022-10-17 DIAGNOSIS — Z8249 Family history of ischemic heart disease and other diseases of the circulatory system: Secondary | ICD-10-CM

## 2022-10-17 DIAGNOSIS — R519 Headache, unspecified: Secondary | ICD-10-CM | POA: Diagnosis not present

## 2022-10-17 LAB — CBC
HCT: 37.3 % (ref 36.0–46.0)
Hemoglobin: 11.9 g/dL — ABNORMAL LOW (ref 12.0–15.0)
MCH: 29.4 pg (ref 26.0–34.0)
MCHC: 31.9 g/dL (ref 30.0–36.0)
MCV: 92.1 fL (ref 80.0–100.0)
Platelets: 230 K/uL (ref 150–400)
RBC: 4.05 MIL/uL (ref 3.87–5.11)
RDW: 13.3 % (ref 11.5–15.5)
WBC: 10.4 K/uL (ref 4.0–10.5)
nRBC: 0 % (ref 0.0–0.2)

## 2022-10-17 LAB — PROTIME-INR
INR: 1 (ref 0.8–1.2)
Prothrombin Time: 13.4 seconds (ref 11.4–15.2)

## 2022-10-17 LAB — I-STAT CHEM 8, ED
BUN: 19 mg/dL (ref 8–23)
Calcium, Ion: 1.19 mmol/L (ref 1.15–1.40)
Chloride: 104 mmol/L (ref 98–111)
Creatinine, Ser: 2.1 mg/dL — ABNORMAL HIGH (ref 0.44–1.00)
Glucose, Bld: 110 mg/dL — ABNORMAL HIGH (ref 70–99)
HCT: 36 % (ref 36.0–46.0)
Hemoglobin: 12.2 g/dL (ref 12.0–15.0)
Potassium: 3.5 mmol/L (ref 3.5–5.1)
Sodium: 139 mmol/L (ref 135–145)
TCO2: 26 mmol/L (ref 22–32)

## 2022-10-17 LAB — COMPREHENSIVE METABOLIC PANEL
ALT: 15 U/L (ref 0–44)
AST: 19 U/L (ref 15–41)
Albumin: 3.6 g/dL (ref 3.5–5.0)
Alkaline Phosphatase: 66 U/L (ref 38–126)
Anion gap: 9 (ref 5–15)
BUN: 18 mg/dL (ref 8–23)
CO2: 25 mmol/L (ref 22–32)
Calcium: 9.5 mg/dL (ref 8.9–10.3)
Chloride: 101 mmol/L (ref 98–111)
Creatinine, Ser: 1.9 mg/dL — ABNORMAL HIGH (ref 0.44–1.00)
GFR, Estimated: 28 mL/min — ABNORMAL LOW (ref >60)
Glucose, Bld: 117 mg/dL — ABNORMAL HIGH (ref 70–99)
Potassium: 3.3 mmol/L — ABNORMAL LOW (ref 3.5–5.1)
Sodium: 135 mmol/L (ref 135–145)
Total Bilirubin: 0.3 mg/dL (ref 0.3–1.2)
Total Protein: 6.6 g/dL (ref 6.5–8.1)

## 2022-10-17 LAB — ETHANOL: Alcohol, Ethyl (B): <10 mg/dL (ref <10)

## 2022-10-17 LAB — MAGNESIUM: Magnesium: 2.2 mg/dL (ref 1.7–2.4)

## 2022-10-17 MED ORDER — EZETIMIBE 10 MG PO TABS
10.0000 mg | ORAL_TABLET | Freq: Every day | ORAL | Status: DC
  Administered 2022-10-19 – 2022-10-27 (×9): 10 mg via ORAL
  Filled 2022-10-17 (×10): qty 1

## 2022-10-17 MED ORDER — HYDROCODONE-ACETAMINOPHEN 5-325 MG PO TABS
1.0000 | ORAL_TABLET | Freq: Four times a day (QID) | ORAL | Status: DC | PRN
  Administered 2022-10-17 – 2022-10-18 (×2): 2 via ORAL
  Administered 2022-10-18: 1 via ORAL
  Administered 2022-10-19 (×3): 2 via ORAL
  Administered 2022-10-20: 1 via ORAL
  Filled 2022-10-17 (×2): qty 2
  Filled 2022-10-17 (×2): qty 1
  Filled 2022-10-17 (×3): qty 2

## 2022-10-17 MED ORDER — MORPHINE SULFATE (PF) 4 MG/ML IV SOLN
4.0000 mg | Freq: Once | INTRAVENOUS | Status: AC
  Administered 2022-10-17: 4 mg via INTRAVENOUS
  Filled 2022-10-17: qty 1

## 2022-10-17 MED ORDER — GABAPENTIN 300 MG PO CAPS
300.0000 mg | ORAL_CAPSULE | Freq: Every day | ORAL | Status: DC
  Administered 2022-10-17 – 2022-10-26 (×10): 300 mg via ORAL
  Filled 2022-10-17 (×10): qty 1

## 2022-10-17 MED ORDER — ONDANSETRON HCL 4 MG/2ML IJ SOLN
INTRAMUSCULAR | Status: AC
  Administered 2022-10-17: 4 mg via INTRAVENOUS
  Filled 2022-10-17: qty 2

## 2022-10-17 MED ORDER — CARVEDILOL 12.5 MG PO TABS
12.5000 mg | ORAL_TABLET | Freq: Two times a day (BID) | ORAL | Status: DC
  Administered 2022-10-17 – 2022-10-27 (×20): 12.5 mg via ORAL
  Filled 2022-10-17 (×20): qty 1

## 2022-10-17 MED ORDER — FENTANYL CITRATE PF 50 MCG/ML IJ SOSY
50.0000 ug | PREFILLED_SYRINGE | Freq: Once | INTRAMUSCULAR | Status: AC
  Administered 2022-10-17: 50 ug via INTRAVENOUS
  Filled 2022-10-17: qty 1

## 2022-10-17 MED ORDER — LEFLUNOMIDE 10 MG PO TABS
20.0000 mg | ORAL_TABLET | Freq: Every day | ORAL | Status: DC
  Administered 2022-10-19 – 2022-10-27 (×9): 20 mg via ORAL
  Filled 2022-10-17 (×10): qty 2

## 2022-10-17 MED ORDER — TRAZODONE HCL 50 MG PO TABS
100.0000 mg | ORAL_TABLET | Freq: Every evening | ORAL | Status: DC | PRN
  Administered 2022-10-17 – 2022-10-26 (×6): 100 mg via ORAL
  Filled 2022-10-17 (×6): qty 2

## 2022-10-17 MED ORDER — ROSUVASTATIN CALCIUM 20 MG PO TABS
20.0000 mg | ORAL_TABLET | Freq: Every day | ORAL | Status: DC
  Administered 2022-10-19 – 2022-10-27 (×9): 20 mg via ORAL
  Filled 2022-10-17 (×10): qty 1

## 2022-10-17 MED ORDER — MELATONIN 5 MG PO TABS
5.0000 mg | ORAL_TABLET | Freq: Every day | ORAL | Status: DC
  Administered 2022-10-17 – 2022-10-26 (×10): 5 mg via ORAL
  Filled 2022-10-17 (×10): qty 1

## 2022-10-17 MED ORDER — HYDROXYCHLOROQUINE SULFATE 200 MG PO TABS
200.0000 mg | ORAL_TABLET | Freq: Every day | ORAL | Status: DC
  Administered 2022-10-19 – 2022-10-27 (×9): 200 mg via ORAL
  Filled 2022-10-17 (×10): qty 1

## 2022-10-17 MED ORDER — ONDANSETRON HCL 4 MG/2ML IJ SOLN: 4.0000 mg | Freq: Once | INTRAMUSCULAR | Status: AC

## 2022-10-17 MED ORDER — HYDROMORPHONE HCL 1 MG/ML IJ SOLN
0.5000 mg | INTRAMUSCULAR | Status: DC | PRN
  Administered 2022-10-17 – 2022-10-18 (×2): 0.5 mg via INTRAVENOUS
  Administered 2022-10-18: .5 mg via INTRAVENOUS
  Administered 2022-10-19 – 2022-10-20 (×2): 0.5 mg via INTRAVENOUS
  Filled 2022-10-17 (×5): qty 0.5

## 2022-10-17 MED ORDER — MELATONIN 5 MG PO CAPS: 5.0000 mg | ORAL_CAPSULE | Freq: Every day | ORAL | Status: DC

## 2022-10-17 MED ORDER — POTASSIUM CHLORIDE 20 MEQ PO PACK: 40.0000 meq | PACK | Freq: Once | ORAL | Status: DC

## 2022-10-17 NOTE — Progress Notes (Signed)
Orthopedic Tech Progress Note Patient Details:  Samantha Mcbride 07/23/1951 [id number]  Level 2 trauma   Patient ID: [name], female   DOB: 1951-09-29, 72 y.o.   MRN: [id number]  Janit Pagan 10/17/2022, 12:18 PM

## 2022-10-17 NOTE — H&P (Signed)
History and Physical   Samantha Mcbride OHY:[id number] DOB: 17-Jun-1951 DOA: 10/17/2022  PCP: Antony Contras, MD   Patient coming from: Home  Chief Complaint: Fall  HPI: Samantha Mcbride is a 72 y.o. female with medical history significant of CAD status post STEMI and DES, peri-infarct V. tach, COPD, CKD 4, RA, RLS, HLD, hypertension, obesity, breast cancer in remission presenting after fall.  Patient experienced a fall earlier today when her daughter's dog ran into her and knocked her down on a concrete surface.  She suffered a contusion to the back of her head as well as left hip pain.  EMS noted left leg shortening.  Patient is on Plavix.  She denies fevers, chills, chest pain, shortness of breath, abdominal pain, constipation, diarrhea, nausea, vomiting.  ED Course: Vital signs in the ED significant for blood pressure in the 948N to 462V systolic.  Lab workup included CMP with potassium 3.3, creatinine near baseline of 1.9, glucose 117.  CBC with hemoglobin stable 11.9.  PT and INR normal.  Urinalysis pending.  Ethanol level negative.  CT head showed no acute abnormality and chronic changes as well as meningioma.  CT C-spine show no acute abnormality but cervical spondylosis was noted as well as a thyroid nodule with recommendation for ultrasound follow-up.  Left femur x-ray showed irregularity at the left intertrochanteric region suspicious for fracture with recommended for additional imaging to follow-up.  Pelvis x-ray showed left intratrochanteric femur fracture.  Chest x-ray shows hyperinflation, mild cardiomegaly, tortuous aorta.  Patient received fentanyl, morphine in the ED as well as a dose of Zofran.  Orthopedics consulted and will see the patient.  Review of Systems: As per HPI otherwise all other systems reviewed and are negative.  Past Medical History:  Diagnosis Date   Anemia    iron deficiency anemia    CAD S/P percutaneous coronary angioplasty 08/2011   Promus DES - mid LAD 3.5  mm x 24 mm (3.72 mm)    Cancer (HCC)    Chronic kidney disease    stage 4 kidney disease per pt dx 0/3500   Complication of anesthesia    Dyslipidemia, goal LDL below 70     on statin, close to goal   Family history of breast cancer    Family history of skin cancer    Former moderate cigarette smoker (10-19 per day)    Glucose intolerance (impaired glucose tolerance)    H/O thyroid nodule    Benign   History 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 --> Promus DES =>  EF 40 to 45% (similar to 2012) stable wall motion normality (severe HK of midapical anterior apical wall-consistent with prior infarct).  GRII DD.  Normal valves. => Stable compared to 08/30/2011.  EF was still 40 to 45%.   Hypertension    Personal history of radiation therapy    PONV (postoperative nausea and vomiting)    no issues with surgery on 05-11-2019   Rheumatoid arthritis ([place])    managed on humira    SOB (shortness of breath) on exertion    reports "its been that way since my heart attack " repots no recurrence of MI sx since that time     Past Surgical History:  Procedure Laterality Date   ABDOMINAL AORTAGRAM N/A 08/27/2011   Procedure: ABDOMINAL Maxcine Ham;  Surgeon: Troy Sine, MD;  Location: [facility] CATH LAB;  Service: Cardiovascular;  Laterality: N/A;  ANKLE SURGERY Right 1995   per pt ankle surgery here at [facility] EXCISIONAL BIOPSY Left    BREAST LUMPECTOMY Right 07/2020   BREAST LUMPECTOMY WITH RADIOACTIVE SEED AND SENTINEL LYMPH NODE BIOPSY Right 08/05/2020   Procedure: RIGHT BREAST LUMPECTOMY WITH RADIOACTIVE SEED AND SENTINEL LYMPH NODE BIOPSY;  Surgeon: [name], MD;  Location: [place];  Service: General;  Laterality: Right;   CYSTOSCOPY WITH RETROGRADE PYELOGRAM, URETEROSCOPY AND STENT PLACEMENT Bilateral 05/11/2019   Procedure: CYSTOSCOPY WITH RETROGRADE PYELOGRAM,  AND STENT PLACEMENT;   Surgeon: Lucas Mallow, MD;  Location: [facility];  Service: Urology;  Laterality: Bilateral;   CYSTOSCOPY/URETEROSCOPY/HOLMIUM LASER/STENT PLACEMENT Bilateral 05/25/2019   Procedure: CYSTOSCOPY BILATERAL URETEROSCOPY/HOLMIUM LASER/STENT PLACEMENT;  Surgeon: Lucas Mallow, MD;  Location: [facility];  Service: Urology;  Laterality: Bilateral;   LEFT HEART CATHETERIZATION WITH CORONARY ANGIOGRAM N/A 08/27/2011   Procedure: LEFT HEART CATHETERIZATION WITH CORONARY ANGIOGRAM;  Surgeon: Troy Sine, MD;  Location: [facility] CATH LAB;  Service: Cardiovascular;;For anterior STEMI/cardiac arrest -- 100% mid LAD occlusion   PERCUTANEOUS CORONARY STENT INTERVENTION (PCI-S) N/A 08/27/2011   Procedure: PERCUTANEOUS CORONARY STENT INTERVENTION (PCI-S);  Surgeon: Troy Sine, MD;  Location: [facility] CATH LAB;  Service: Cardiovascular;;  mid LAD PCI --> Promus Element DES 3.5 mm at 24 mm (3.72 mm)   TRANSTHORACIC ECHOCARDIOGRAM  08/2011   EF 40-45%, moderate at K. of mid and distal inferior septum and anterior apical myocardium. Grade 1 diastolic function. -- Followup echocardiogram to reassess his EF was denied by insurance company   TRANSTHORACIC ECHOCARDIOGRAM  07/20/2020    EF 40 to 45% (similar to 2012) stable wall motion normality (severe HK of midapical anterior apical wall-consistent with prior infarct).  GRII DD.  Normal valves. => Stable compared to 08/30/2011.  EF was still 40 to 45%.    Social History  reports that she quit smoking about 11 years ago. Her smoking use included cigarettes. She has a 40.00 pack-year smoking history. She has never used smokeless tobacco. She reports that she does not drink alcohol and does not use drugs.  No Known Allergies  Family History  Problem Relation Age of Onset   Hypertension Mother    Breast cancer Mother 60       early 42's   Heart failure Father    Heart attack Paternal Grandfather    Breast cancer Sister 58   Skin cancer Sister 98       face  Reviewed on  admission  Prior to Admission medications   Medication Sig Start Date End Date Taking? Authorizing Provider  acetaminophen (TYLENOL) 500 MG tablet Take 500 mg by mouth every 6 (six) hours as needed for moderate pain or headache.    [provider]  amoxicillin (AMOXIL) 500 MG tablet Take 1 tablet (500 mg total) by mouth 2 (two) times daily. 07/24/22   Nicholas Lose, MD  Calcium Citrate-Vitamin D (CALCIUM + D PO) Take 1 tablet by mouth daily.    [provider]  carvedilol (COREG) 12.5 MG tablet Take 12.5 mg by mouth 2 (two) times daily. 04/01/19   [provider]  clopidogrel (PLAVIX) 75 MG tablet TAKE 1 TABLET BY MOUTH EVERY DAY 06/04/22   Leonie Man, MD  COVID-19 mRNA bivalent vaccine, Pfizer, (PFIZER COVID-19 VAC BIVALENT) injection Inject into the muscle. 07/20/21   Carlyle Basques, MD  COVID-19 mRNA vaccine, Pfizer, 30 MCG/0.3ML injection Inject into the muscle.  01/24/21   Carlyle Basques, MD  diclofenac sodium (VOLTAREN) 1 % GEL Apply 2 g topically 4 (four) times daily as needed (pain).     [provider]  DM-APAP-CPM (CORICIDIN HBP PO) Take 1 tablet by mouth daily as needed (allergies).    [provider]  ezetimibe (ZETIA) 10 MG tablet TAKE 1 TABLET BY MOUTH EVERY DAY 07/16/22   Leonie Man, MD  gabapentin (NEURONTIN) 100 MG capsule TAKE 3 CAPSULES BY MOUTH AT BEDTIME. 04/19/22   Hyatt, Max T, DPM  hydroxychloroquine (PLAQUENIL) 200 MG tablet Take 1 tablet (200 mg total) by mouth daily. 06/15/20   Nicholas Lose, MD  leflunomide (ARAVA) 20 MG tablet Take 20 mg by mouth daily. 01/09/21   [provider]  Melatonin 5 MG CAPS Take 5 mg by mouth at bedtime.    [provider]  nitroGLYCERIN (NITROSTAT) 0.4 MG SL tablet PLACE 1 TABLET UNDER THE TONGUE EVERY 5 MINUTES AS NEEDED FOR CHEST PAIN. 08/01/15   Leonie Man, MD  Olopatadine HCl (PATADAY OP) Place 1 drop into both eyes daily as needed (allergies).    [provider]  promethazine (PHENERGAN) 25 MG suppository     [provider]  rosuvastatin (CRESTOR) 20 MG tablet TAKE 1 TABLET BY MOUTH EVERY DAY 09/12/22   Leonie Man, MD  tiZANidine (ZANAFLEX) 2 MG tablet Take 2 mg by mouth at bedtime.    [provider]  traMADol (ULTRAM) 50 MG tablet Take by mouth every 12 (twelve) hours as needed.    [provider]  traZODone (DESYREL) 100 MG tablet Take 100 mg by mouth at bedtime.     [provider]  triamcinolone acetonide (KENALOG-40) 40 MG/ML injection Take 2 mL by injection route. 08/24/21   [provider]    Physical Exam: Vitals:   10/17/22 1300 10/17/22 1315 10/17/22 1330 10/17/22 1400  BP: 133/88 (!) 123/102 138/70 121/78  Pulse: 81 82 81 84  Resp: 19 (!) '21 17 16  '$ Temp:      TempSrc:      SpO2:  95% 95% 94%  Weight:      Height:        Physical Exam Constitutional:      General: She is not in acute distress.    Appearance: Normal appearance.  HENT:     Head: Normocephalic and atraumatic.     Mouth/Throat:     Mouth: Mucous membranes are moist.     Pharynx: Oropharynx is clear.  Eyes:     Extraocular Movements: Extraocular movements intact.     Pupils: Pupils are equal, round, and reactive to light.  Cardiovascular:     Rate and Rhythm: Normal rate and regular rhythm.     Pulses: Normal pulses.     Heart sounds: Normal heart sounds.  Pulmonary:     Effort: Pulmonary effort is normal. No respiratory distress.     Breath sounds: Normal breath sounds.  Abdominal:     General: Bowel sounds are normal. There is no distension.     Palpations: Abdomen is soft.     Tenderness: There is no abdominal tenderness.  Musculoskeletal:        General: No swelling or deformity.     Comments: Neurovascularly intact bilaterally.  Left lower extremity foreshortened.  Skin:    General: Skin is warm and dry.  Neurological:     General: No focal deficit present.     Mental Status:  Mental status  is at baseline.    Labs on Admission: I have personally reviewed following labs and imaging studies  CBC: Recent Labs  Lab 10/17/22 1140 10/17/22 1148  WBC  --  10.4  HGB 12.2 11.9*  HCT 36.0 37.3  MCV  --  92.1  PLT  --  097    Basic Metabolic Panel: Recent Labs  Lab 10/17/22 1140 10/17/22 1148  NA 139 135  K 3.5 3.3*  CL 104 101  CO2  --  25  GLUCOSE 110* 117*  BUN 19 18  CREATININE 2.10* 1.90*  CALCIUM  --  9.5    GFR: Estimated Creatinine Clearance: 25.3 mL/min (A) (by C-G formula based on SCr of 1.9 mg/dL (H)).  Liver Function Tests: Recent Labs  Lab 10/17/22 1148  AST 19  ALT 15  ALKPHOS 66  BILITOT 0.3  PROT 6.6  ALBUMIN 3.6    Urine analysis:    Component Value Date/Time   COLORURINE YELLOW 05/11/2019 1053   APPEARANCEUR HAZY (A) 05/11/2019 1053   LABSPEC 1.009 05/11/2019 1053   PHURINE 6.0 05/11/2019 1053   GLUCOSEU 50 (A) 05/11/2019 1053   HGBUR MODERATE (A) 05/11/2019 1053   BILIRUBINUR NEGATIVE 05/11/2019 [place] 05/11/2019 1053   PROTEINUR 30 (A) 05/11/2019 1053   NITRITE NEGATIVE 05/11/2019 1053   LEUKOCYTESUR MODERATE (A) 05/11/2019 1053    Radiological Exams on Admission: DG Pelvis Portable  Result Date: 10/17/2022 CLINICAL DATA:  Trauma EXAM: PORTABLE PELVIS 1-2 VIEWS COMPARISON:  None Available. FINDINGS: Basicervical to intertrochanteric fracture of the left femur. IMPRESSION: Basicervical to intertrochanteric fracture of the left femur. Electronically Signed   By: Nelson Chimes M.D.   On: 10/17/2022 12:37   DG Chest Port 1 View  Result Date: 10/17/2022 CLINICAL DATA:  Pain after trauma EXAM: PORTABLE CHEST 1 VIEW COMPARISON:  Chest CT 05/18/2021.  X-ray 2013 FINDINGS: Hyperinflation. Enlarged cardiopericardial silhouette. Tortuous and ectatic aorta. No consolidation, pneumothorax or effusion. No edema. Overlapping cardiac leads. Surgical clips in the right axillary region. IMPRESSION:  Hyperinflation.  Mild enlarged heart.  Calcified tortuous aorta Electronically Signed   By: Jill Side M.D.   On: 10/17/2022 12:35   CT HEAD WO CONTRAST  Result Date: 10/17/2022 CLINICAL DATA:  Fall, head injury.  On Plavix EXAM: CT HEAD WITHOUT CONTRAST CT CERVICAL SPINE WITHOUT CONTRAST TECHNIQUE: Multidetector CT imaging of the head and cervical spine was performed following the standard protocol without intravenous contrast. Multiplanar CT image reconstructions of the cervical spine were also generated. RADIATION DOSE REDUCTION: This exam was performed according to the departmental dose-optimization program which includes automated exposure control, adjustment of the mA and/or kV according to patient size and/or use of iterative reconstruction technique. COMPARISON:  None Available. FINDINGS: CT HEAD FINDINGS Brain: Ventricle size normal.  No acute infarct or hemorrhage. 1 cm calcified lesion to the left of the falx in the posterior frontal lobe likely meningioma. No brain edema. Additional 5 mm dural base calcification left frontotemporal lobe may be benign dural calcification or meningioma as well. Patchy white matter hypodensity bilaterally most compatible with chronic microvascular ischemia. Vascular: Negative for hyperdense vessel Skull: Negative Sinuses/Orbits: Paranasal sinuses clear. Bilateral cataract extraction Other: None CT CERVICAL SPINE FINDINGS Alignment: Mild anterolisthesis C3-4 and C4-5 Skull base and vertebrae: Negative for fracture Soft tissues and spinal canal: Right upper pole thyroid nodule 2.3 cm in diameter. Disc levels: Cervical spondylosis with disc degeneration and facet degeneration. Spinal and foraminal stenosis bilaterally most prominent at C5-6.  Upper chest: Visualized lung apices clear bilaterally Other: None IMPRESSION: 1. No acute intracranial abnormality. Chronic microvascular ischemic change in the white matter. 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  Additional 5 mm calcification left frontotemporal lobe may be benign dural calcification or meningioma. 2. Cervical spondylosis. Negative for cervical fracture. 3. 2.3 cm right thyroid nodule. Recommend thyroid ultrasound. (Ref: J Am Coll Radiol. 2015 Feb;12(2): 143-50). Electronically Signed   By: Franchot Gallo M.D.   On: 10/17/2022 12:34   CT CERVICAL SPINE WO CONTRAST  Result Date: 10/17/2022 CLINICAL DATA:  Fall, head injury.  On Plavix EXAM: CT HEAD WITHOUT CONTRAST CT CERVICAL SPINE WITHOUT CONTRAST TECHNIQUE: Multidetector CT imaging of the head and cervical spine was performed following the standard protocol without intravenous contrast. Multiplanar CT image reconstructions of the cervical spine were also generated. RADIATION DOSE REDUCTION: This exam was performed according to the departmental dose-optimization program which includes automated exposure control, adjustment of the mA and/or kV according to patient size and/or use of iterative reconstruction technique. COMPARISON:  None Available. FINDINGS: CT HEAD FINDINGS Brain: Ventricle size normal.  No acute infarct or hemorrhage. 1 cm calcified lesion to the left of the falx in the posterior frontal lobe likely meningioma. No brain edema. Additional 5 mm dural base calcification left frontotemporal lobe may be benign dural calcification or meningioma as well. Patchy white matter hypodensity bilaterally most compatible with chronic microvascular ischemia. Vascular: Negative for hyperdense vessel Skull: Negative Sinuses/Orbits: Paranasal sinuses clear. Bilateral cataract extraction Other: None CT CERVICAL SPINE FINDINGS Alignment: Mild anterolisthesis C3-4 and C4-5 Skull base and vertebrae: Negative for fracture Soft tissues and spinal canal: Right upper pole thyroid nodule 2.3 cm in diameter. Disc levels: Cervical spondylosis with disc degeneration and facet degeneration. Spinal and foraminal stenosis bilaterally most prominent at C5-6. Upper chest:  Visualized lung apices clear bilaterally Other: None IMPRESSION: 1. No acute intracranial abnormality. Chronic microvascular ischemic change in the white matter. 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 Additional 5 mm calcification left frontotemporal lobe may be benign dural calcification or meningioma. 2. Cervical spondylosis. Negative for cervical fracture. 3. 2.3 cm right thyroid nodule. Recommend thyroid ultrasound. (Ref: J Am Coll Radiol. 2015 Feb;12(2): 143-50). Electronically Signed   By: Franchot Gallo M.D.   On: 10/17/2022 12:34   DG FEMUR PORT 1V LEFT  Result Date: 10/17/2022 CLINICAL DATA:  Blunt trauma.  Pain EXAM: LEFT FEMUR PORTABLE 1 VIEW COMPARISON:  None Available. FINDINGS: There is cortical irregularity along the femoral neck through the intratrochanteric region typically with irregularity along the lesser trochanter. Subtle nondisplaced fracture is possible. Preserved bone mineralization. Degenerative changes of the hip joint with joint space loss and osteophytes. IMPRESSION: Cortical irregularity along the intertrochanteric region and lower femoral neck involving the lesser trochanter worrisome for nondisplaced fracture. Dedicated hip imaging with oblique film could be some use to further delineate Electronically Signed   By: Jill Side M.D.   On: 10/17/2022 12:34    EKG: Not yet performed  Assessment/Plan Principal Problem:   Intertrochanteric fracture of left hip (HCC) Active Problems:   Hyperlipidemia associated with type 2 diabetes mellitus (HCC)   Obesity (BMI 30-39.9)   H/O Anterior STEMI:  Proximal LAD  insertion of a 3.5x24 mm Promus element DES (08/2011)   CAD S/P percutaneous coronary angioplasty   Essential hypertension   Malignant neoplasm of upper-outer quadrant of right breast in female, estrogen receptor positive ([place])   Chronic kidney disease, stage 4 (severe) ([place])  Restless legs   Rheumatoid arthritis (HCC)   COPD (chronic obstructive  pulmonary disease) with emphysema (HCC) noted on CT   Left intratrochanteric hip fracture Fall > Patient suffered a fall earlier today when her daughter's dog ran into her.  Hit her head as well but no significant changes on head imaging. > Left hip imaging indicating left intratrochanteric hip fracture.  Orthopedic consulted and will see the patient. > Patient is on Plavix which is being held - Monitor on telemetry - Appreciate orthopedics recommendations - Continue n.p.o. for now - Pain control as needed - Nerve Block consult to anesthesia - Hip fracture protocol  CKD 4 Hypokalemia > Creatinine stable 1.9 in the ED. potassium noted to be 3.3. - 40 mill equivalents p.o. potassium - Check magnesium - Noted continue to trend renal function and electrolytes  Rheumatoid arthritis - Continue home leflunomide and hydroxychloroquine  HLD CAD > History of STEMI and stent placement. - Continue home carvedilol, Zetia, rosuvastatin - Holding home Plavix as above  Hypertension - Continue home carvedilol  History of breast cancer > Monitored by oncology, currently in remission - Noted  Obesity - Noted  COPD listed in chart but not on any medications  DVT prophylaxis: SCDs for now Code Status:   Full, confirmed with patient Family Communication:  None on admission, are aware of admission.  Disposition Plan:   Patient is from:  Home  Anticipated DC to:  Pending clinical course  Anticipated DC date:  1 to 3 days  Anticipated DC barriers: None  Consults called:  Orthopedics Admission status:  Observation, telemetry  Severity of Illness: The appropriate patient status for this patient is OBSERVATION. Observation status is judged to be reasonable and necessary in order to provide the required intensity of service to ensure the patient's safety. The patient's presenting symptoms, physical exam findings, and initial radiographic and laboratory data in the context of their medical  condition is felt to place them at decreased risk for further clinical deterioration. Furthermore, it is anticipated that the patient will be medically stable for discharge from the hospital within 2 midnights of admission.    Marcelyn Bruins MD [facility]  How to contact the [facility] Attending or Consulting provider [place] or covering provider during after hours [place], for this patient?   Check the care team in [facility] and look for a) attending/consulting [facility] provider listed and b) the [facility] team listed Log into [email or web address] and use [place]'s universal password to access. If you do not have the password, please contact the hospital operator. Locate the [facility] provider you are looking for under [facility] and page to a number that you can be directly reached. If you still have difficulty reaching the provider, please page the [facility] (Director on Call) for the Hospitalists listed on amion for assistance.  10/17/2022, 2:36 PM

## 2022-10-17 NOTE — ED Provider Notes (Signed)
[facility] EMERGENCY DEPARTMENT Provider Note   CSN: [id number] Arrival date & time: 10/17/22  1121    History  Chief Complaint  Patient presents with   Samantha Mcbride    Samantha Mcbride is a 72 y.o. female hx of STEMI, HTN, RA, breast CA in remission, COPD, CKD here for evaluation after mechanical fall.   Mechanical fall due to being knocked down by family members dog. On plavix. Pain to posterior head and left hip. Pelvic binder by [employer] PTA. No CP, SOB, abd pain, numbness, weakness, back pain. In C collar on arrival.  C/AP without tenderness. Pelvis stable. Rotation to Left extremity per EMS.   Otherwise well prior to today  HPI     Home Medications Prior to Admission medications   Medication Sig Start Date End Date Taking? Authorizing Provider  acetaminophen (TYLENOL) 500 MG tablet Take 500 mg by mouth every 6 (six) hours as needed for moderate pain or headache.    [provider]  amoxicillin (AMOXIL) 500 MG tablet Take 1 tablet (500 mg total) by mouth 2 (two) times daily. 07/24/22   Nicholas Lose, MD  Calcium Citrate-Vitamin D (CALCIUM + D PO) Take 1 tablet by mouth daily.    [provider]  carvedilol (COREG) 12.5 MG tablet Take 12.5 mg by mouth 2 (two) times daily. 04/01/19   [provider]  clopidogrel (PLAVIX) 75 MG tablet TAKE 1 TABLET BY MOUTH EVERY DAY 06/04/22   Leonie Man, MD  COVID-19 mRNA bivalent vaccine, Pfizer, (PFIZER COVID-19 VAC BIVALENT) injection Inject into the muscle. 07/20/21   Carlyle Basques, MD  COVID-19 mRNA vaccine, Pfizer, 30 MCG/0.3ML injection Inject into the muscle. 01/24/21   Carlyle Basques, MD  diclofenac sodium (VOLTAREN) 1 % GEL Apply 2 g topically 4 (four) times daily as needed (pain).     [provider]  DM-APAP-CPM (CORICIDIN HBP PO) Take 1 tablet by mouth daily as needed (allergies).    [provider]  ezetimibe (ZETIA) 10 MG tablet TAKE 1 TABLET BY MOUTH EVERY DAY 07/16/22    Leonie Man, MD  gabapentin (NEURONTIN) 100 MG capsule TAKE 3 CAPSULES BY MOUTH AT BEDTIME. 04/19/22   Hyatt, Max T, DPM  hydroxychloroquine (PLAQUENIL) 200 MG tablet Take 1 tablet (200 mg total) by mouth daily. 06/15/20   Nicholas Lose, MD  leflunomide (ARAVA) 20 MG tablet Take 20 mg by mouth daily. 01/09/21   [provider]  Melatonin 5 MG CAPS Take 5 mg by mouth at bedtime.    [provider]  nitroGLYCERIN (NITROSTAT) 0.4 MG SL tablet PLACE 1 TABLET UNDER THE TONGUE EVERY 5 MINUTES AS NEEDED FOR CHEST PAIN. 08/01/15   Leonie Man, MD  Olopatadine HCl (PATADAY OP) Place 1 drop into both eyes daily as needed (allergies).    [provider]  promethazine (PHENERGAN) 25 MG suppository     [provider]  rosuvastatin (CRESTOR) 20 MG tablet TAKE 1 TABLET BY MOUTH EVERY DAY 09/12/22   Leonie Man, MD  tiZANidine (ZANAFLEX) 2 MG tablet Take 2 mg by mouth at bedtime.    [provider]  traMADol (ULTRAM) 50 MG tablet Take by mouth every 12 (twelve) hours as needed.    [provider]  traZODone (DESYREL) 100 MG tablet Take 100 mg by mouth at bedtime.     [provider]  triamcinolone acetonide (KENALOG-40) 40 MG/ML injection Take 2 mL by injection route. 08/24/21   [provider]  Allergies    Patient has no known allergies.    Review of Systems   Review of Systems  Constitutional: Negative.   HENT: Negative.    Respiratory: Negative.    Cardiovascular: Negative.   Gastrointestinal: Negative.   Genitourinary: Negative.   Musculoskeletal:  Negative for back pain, gait problem, neck pain and neck stiffness.       Left hip pain  Skin: Negative.   Neurological: Negative.   All other systems reviewed and are negative.   Physical Exam Updated Vital Signs BP 121/78   Pulse 84   Temp 98.5 F (36.9 C) (Oral)   Resp 16   Ht '5\' 1"'$  (1.549 m)   Wt 73.9 kg   SpO2 94%   BMI 30.78 kg/m  Physical  Exam Vitals and nursing note reviewed.  Constitutional:      General: She is not in acute distress.    Appearance: She is well-developed. She is not ill-appearing, toxic-appearing or diaphoretic.  HENT:     Head: Normocephalic and atraumatic.     Nose: Nose normal.     Mouth/Throat:     Mouth: Mucous membranes are moist.  Eyes:     Pupils: Pupils are equal, round, and reactive to light.  Neck:     Comments: C-collar present, no midline tenderness Cardiovascular:     Rate and Rhythm: Normal rate.     Pulses: Normal pulses.          Radial pulses are 2+ on the right side and 2+ on the left side.       Dorsalis pedis pulses are 2+ on the right side and 2+ on the left side.     Heart sounds: Normal heart sounds.  Pulmonary:     Effort: Pulmonary effort is normal. No respiratory distress.     Breath sounds: Normal breath sounds.     Comments: Clear bilaterally, speaks without difficulty Abdominal:     General: Bowel sounds are normal. There is no distension.     Palpations: Abdomen is soft.     Tenderness: There is no abdominal tenderness. There is no right CVA tenderness, left CVA tenderness or guarding.     Comments: Soft, nontender, no overlying skin changes to abdominal or flanks  Musculoskeletal:        General: Normal range of motion.     Comments: No midline C/T/L tenderness.  No bony tenderness while upper extremities, lifts above head without difficulty.  Pelvis stable, mild tenderness left anterior posterior hip.  She has shortening and rotation of her left leg.  No bony tenderness to distal femur, knee, tib-fib, foot on left.  Nontender entire right lower extremity able to flex and extend at right hip, knee and foot without difficulty  Skin:    General: Skin is warm and dry.     Capillary Refill: Capillary refill takes less than 2 seconds.     Comments: Minimal soft tissue erythema left lateral aspect hip.  No ecchymosis  Neurological:     General: No focal deficit present.      Mental Status: She is alert and oriented to person, place, and time.     Comments: Intact sensation bilateral lower extremities  Psychiatric:        Mood and Affect: Mood normal.     ED Results / Procedures / Treatments   Labs (all labs ordered are listed, but only abnormal results are displayed) Labs Reviewed  COMPREHENSIVE METABOLIC PANEL - Abnormal; Notable for the following  components:      Result Value   Potassium 3.3 (*)    Glucose, Bld 117 (*)    Creatinine, Ser 1.90 (*)    GFR, Estimated 28 (*)    All other components within normal limits  CBC - Abnormal; Notable for the following components:   Hemoglobin 11.9 (*)    All other components within normal limits  I-STAT CHEM 8, ED - Abnormal; Notable for the following components:   Creatinine, Ser 2.10 (*)    Glucose, Bld 110 (*)    All other components within normal limits  ETHANOL  PROTIME-INR  URINALYSIS, ROUTINE W REFLEX MICROSCOPIC  HIV ANTIBODY (ROUTINE TESTING W REFLEX)  MAGNESIUM    EKG None  Radiology DG Pelvis Portable  Result Date: 10/17/2022 CLINICAL DATA:  Trauma EXAM: PORTABLE PELVIS 1-2 VIEWS COMPARISON:  None Available. FINDINGS: Basicervical to intertrochanteric fracture of the left femur. IMPRESSION: Basicervical to intertrochanteric fracture of the left femur. Electronically Signed   By: Nelson Chimes M.D.   On: 10/17/2022 12:37   DG Chest Port 1 View  Result Date: 10/17/2022 CLINICAL DATA:  Pain after trauma EXAM: PORTABLE CHEST 1 VIEW COMPARISON:  Chest CT 05/18/2021.  X-ray 2013 FINDINGS: Hyperinflation. Enlarged cardiopericardial silhouette. Tortuous and ectatic aorta. No consolidation, pneumothorax or effusion. No edema. Overlapping cardiac leads. Surgical clips in the right axillary region. IMPRESSION: Hyperinflation.  Mild enlarged heart.  Calcified tortuous aorta Electronically Signed   By: Jill Side M.D.   On: 10/17/2022 12:35   CT HEAD WO CONTRAST  Result Date: 10/17/2022 CLINICAL  DATA:  Fall, head injury.  On Plavix EXAM: CT HEAD WITHOUT CONTRAST CT CERVICAL SPINE WITHOUT CONTRAST TECHNIQUE: Multidetector CT imaging of the head and cervical spine was performed following the standard protocol without intravenous contrast. Multiplanar CT image reconstructions of the cervical spine were also generated. RADIATION DOSE REDUCTION: This exam was performed according to the departmental dose-optimization program which includes automated exposure control, adjustment of the mA and/or kV according to patient size and/or use of iterative reconstruction technique. COMPARISON:  None Available. FINDINGS: CT HEAD FINDINGS Brain: Ventricle size normal.  No acute infarct or hemorrhage. 1 cm calcified lesion to the left of the falx in the posterior frontal lobe likely meningioma. No brain edema. Additional 5 mm dural base calcification left frontotemporal lobe may be benign dural calcification or meningioma as well. Patchy white matter hypodensity bilaterally most compatible with chronic microvascular ischemia. Vascular: Negative for hyperdense vessel Skull: Negative Sinuses/Orbits: Paranasal sinuses clear. Bilateral cataract extraction Other: None CT CERVICAL SPINE FINDINGS Alignment: Mild anterolisthesis C3-4 and C4-5 Skull base and vertebrae: Negative for fracture Soft tissues and spinal canal: Right upper pole thyroid nodule 2.3 cm in diameter. Disc levels: Cervical spondylosis with disc degeneration and facet degeneration. Spinal and foraminal stenosis bilaterally most prominent at C5-6. Upper chest: Visualized lung apices clear bilaterally Other: None IMPRESSION: 1. No acute intracranial abnormality. Chronic microvascular ischemic change in the white matter. 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 Additional 5 mm calcification left frontotemporal lobe may be benign dural calcification or meningioma. 2. Cervical spondylosis. Negative for cervical fracture. 3. 2.3 cm right thyroid nodule.  Recommend thyroid ultrasound. (Ref: J Am Coll Radiol. 2015 Feb;12(2): 143-50). Electronically Signed   By: Franchot Gallo M.D.   On: 10/17/2022 12:34   CT CERVICAL SPINE WO CONTRAST  Result Date: 10/17/2022 CLINICAL DATA:  Fall, head injury.  On Plavix EXAM: CT HEAD WITHOUT CONTRAST CT CERVICAL SPINE WITHOUT CONTRAST TECHNIQUE:  Multidetector CT imaging of the head and cervical spine was performed following the standard protocol without intravenous contrast. Multiplanar CT image reconstructions of the cervical spine were also generated. RADIATION DOSE REDUCTION: This exam was performed according to the departmental dose-optimization program which includes automated exposure control, adjustment of the mA and/or kV according to patient size and/or use of iterative reconstruction technique. COMPARISON:  None Available. FINDINGS: CT HEAD FINDINGS Brain: Ventricle size normal.  No acute infarct or hemorrhage. 1 cm calcified lesion to the left of the falx in the posterior frontal lobe likely meningioma. No brain edema. Additional 5 mm dural base calcification left frontotemporal lobe may be benign dural calcification or meningioma as well. Patchy white matter hypodensity bilaterally most compatible with chronic microvascular ischemia. Vascular: Negative for hyperdense vessel Skull: Negative Sinuses/Orbits: Paranasal sinuses clear. Bilateral cataract extraction Other: None CT CERVICAL SPINE FINDINGS Alignment: Mild anterolisthesis C3-4 and C4-5 Skull base and vertebrae: Negative for fracture Soft tissues and spinal canal: Right upper pole thyroid nodule 2.3 cm in diameter. Disc levels: Cervical spondylosis with disc degeneration and facet degeneration. Spinal and foraminal stenosis bilaterally most prominent at C5-6. Upper chest: Visualized lung apices clear bilaterally Other: None IMPRESSION: 1. No acute intracranial abnormality. Chronic microvascular ischemic change in the white matter. 1 cm calcified meningioma left  posterior frontal lobe. Additional 5 mm calcification left frontotemporal lobe may be benign dural calcification or meningioma. 2. Cervical spondylosis. Negative for cervical fracture. 3. 2.3 cm right thyroid nodule. Recommend thyroid ultrasound. (Ref: J Am Coll Radiol. 2015 Feb;12(2): 143-50). Electronically Signed   By: Franchot Gallo M.D.   On: 10/17/2022 12:34   DG FEMUR PORT 1V LEFT  Result Date: 10/17/2022 CLINICAL DATA:  Blunt trauma.  Pain EXAM: LEFT FEMUR PORTABLE 1 VIEW COMPARISON:  None Available. FINDINGS: There is cortical irregularity along the femoral neck through the intratrochanteric region typically with irregularity along the lesser trochanter. Subtle nondisplaced fracture is possible. Preserved bone mineralization. Degenerative changes of the hip joint with joint space loss and osteophytes. IMPRESSION: Cortical irregularity along the intertrochanteric region and lower femoral neck involving the lesser trochanter worrisome for nondisplaced fracture. Dedicated hip imaging with oblique film could be some use to further delineate Electronically Signed   By: Jill Side M.D.   On: 10/17/2022 12:34    Procedures Procedures    Medications Ordered in ED Medications  leflunomide (ARAVA) tablet 20 mg (has no administration in time range)  HYDROcodone-acetaminophen (NORCO/VICODIN) 5-325 MG per tablet 1-2 tablet (has no administration in time range)  potassium chloride (KLOR-CON) packet 40 mEq (has no administration in time range)  HYDROmorphone (DILAUDID) injection 0.5 mg (has no administration in time range)  fentaNYL (SUBLIMAZE) injection 50 mcg (50 mcg Intravenous Given 10/17/22 1200)  ondansetron (ZOFRAN) injection 4 mg (4 mg Intravenous Given 10/17/22 1218)  morphine (PF) 4 MG/ML injection 4 mg (4 mg Intravenous Given 10/17/22 1410)    ED Course/ Medical Decision Making/ A&P     72 year old history of breast cancer in remission, hypertension, CKD, COPD, prior STEMI on Plavix here for  evaluation of mechanical fall from standing after being knocked down by family dog.  She had pain to the posterior aspect of her head without overlying laceration.  Placed in c-collar with EMS.  Also noted to have a left lateral aspect hip pain and shortening rotation of left leg.  Was placed in a pelvic binder with fire prior to arrival.  No midline C/T/L tenderness, does have some diffuse tenderness to  her left hip with shortening and rotation I suspect likely hip fracture.  Pelvic binder removed, non-tender.  Nontender chest/abdomen.  No bony tenderness to left distal femur, tib-fib, foot.  No evidence of open wounds, does not need tetanus updated.  Patient provided pain control.  She is a level 2 trauma due to fall on thinners.  Plan on labs and imaging.  Labs and imaging personally viewed and interpreted:  CBC without leukocytosis Metabolic panel creatinine 1.9, potassium 3.3 INR 1.0 Ethanol less than 10 CT head with calcified meningioma.  I discussed results of this with patient, family in room, outpatient follow-up CT cervical spine without acute cervical fracture, does have enlarged thyroid, recommend ultrasound.  I discussed this with patient, family in room X-ray left femur with possible fracture recommend dedicated imaging to hip Chest x-ray without significant abnormality Pelvic x-ray with LEFT intertrochanteric fracture  Discussed results with patient, family in room will discuss with orthopedics, medicine admit.  Of note she did recently see [facility] for a elbow injury however has not followed with them chronically and has never seen orthopedic group for hip fracture  CONSULT Silvestre Gunner, PA with Ortho, will evaluate at bedside  CONSULT with Dr. Trilby Drummer with [facility] who will evaluation for admission  The patient appears reasonably stabilized for admission considering the current resources, flow, and capabilities available in the ED at this time, and I doubt any other [facility] requiring  further screening and/or treatment in the ED prior to admission.                             Medical Decision Making Amount and/or Complexity of Data Reviewed Independent Historian: EMS External Data Reviewed: labs, radiology, ECG and notes. Labs: ordered. Decision-making details documented in ED Course. Radiology: ordered and independent interpretation performed. Decision-making details documented in ED Course. ECG/medicine tests: ordered and independent interpretation performed. Decision-making details documented in ED Course.  Risk OTC drugs. Prescription drug management. Parenteral controlled substances. Decision regarding hospitalization. Diagnosis or treatment significantly limited by social determinants of health.           Final Clinical Impression(s) / ED Diagnoses Final diagnoses:  Fall, initial encounter  Closed fracture of left hip, initial encounter ([place])  Meningioma [facility])  Thyroid nodule    Rx / DC Orders ED Discharge Orders     None         Daequan Kozma A, PA-C 10/17/22 1437    Fransico Meadow, MD 10/17/22 1609

## 2022-10-17 NOTE — ED Triage Notes (Addendum)
Pt fell from standing after being hit by daughters dog onto concrete. Contusion to back of pt head. Pt takes plavix. EMS reports L leg shortening. Pt reports Right arm restriction d/t right breast cancer. Pt in pelvic binder.

## 2022-10-17 NOTE — Consult Note (Signed)
Reason for Consult:Left hip fx Referring Physician: Neva Seat Time called: 3154 Time at bedside: [name] is an 72 y.o. female.  HPI: Samantha Mcbride was outside when a Rottweiler ran into her and knocked her backwards. She tripped over a curb and fell. She had immediate left hip pain and could not get up. She was brought to the ED where x-rays showed a left hip fx and orthopedic surgery was consulted. She lives at home alone and generally doesn't use assistive devices but has been using a cane lately 2/2 some back issues.  Past Medical History:  Diagnosis Date   Anemia    iron deficiency anemia    CAD S/P percutaneous coronary angioplasty 08/2011   Promus DES - mid LAD 3.5 mm x 24 mm (3.72 mm)    Cancer (HCC)    Chronic kidney disease    stage 4 kidney disease per pt dx 0/0867   Complication of anesthesia    Dyslipidemia, goal LDL below 70     on statin, close to goal   Family history of breast cancer    Family history of skin cancer    Former moderate cigarette smoker (10-19 per day)    Glucose intolerance (impaired glucose tolerance)    H/O thyroid nodule    Benign   History 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 --> Promus DES =>  EF 40 to 45% (similar to 2012) stable wall motion normality (severe HK of midapical anterior apical wall-consistent with prior infarct).  GRII DD.  Normal valves. => Stable compared to 08/30/2011.  EF was still 40 to 45%.   Hypertension    Personal history of radiation therapy    PONV (postoperative nausea and vomiting)    no issues with surgery on 05-11-2019   Rheumatoid arthritis ([place])    managed on humira    SOB (shortness of breath) on exertion    reports "its been that way since my heart attack " repots no recurrence of MI sx since that time     Past Surgical History:  Procedure Laterality Date   ABDOMINAL AORTAGRAM N/A 08/27/2011   Procedure: ABDOMINAL  Samantha Mcbride;  Surgeon: Troy Sine, MD;  Location: [facility] CATH LAB;  Service: Cardiovascular;  Laterality: N/A;   ANKLE SURGERY Right 1995   per pt ankle surgery here at [place] EXCISIONAL BIOPSY Left    BREAST LUMPECTOMY Right 07/2020   BREAST LUMPECTOMY WITH RADIOACTIVE SEED AND SENTINEL LYMPH NODE BIOPSY Right 08/05/2020   Procedure: RIGHT BREAST LUMPECTOMY WITH RADIOACTIVE SEED AND SENTINEL LYMPH NODE BIOPSY;  Surgeon: [name], MD;  Location: [place];  Service: General;  Laterality: Right;   CYSTOSCOPY WITH RETROGRADE PYELOGRAM, URETEROSCOPY AND STENT PLACEMENT Bilateral 05/11/2019   Procedure: CYSTOSCOPY WITH RETROGRADE PYELOGRAM,  AND STENT PLACEMENT;  Surgeon: Lucas Mallow, MD;  [facility];  Service: Urology;  Laterality: Bilateral;   CYSTOSCOPY/URETEROSCOPY/HOLMIUM LASER/STENT PLACEMENT Bilateral 05/25/2019   Procedure: CYSTOSCOPY BILATERAL URETEROSCOPY/HOLMIUM LASER/STENT PLACEMENT;  Surgeon: Lucas Mallow, MD;  Location: [facility];  Service: Urology;  Laterality: Bilateral;   LEFT HEART CATHETERIZATION WITH CORONARY ANGIOGRAM N/A 08/27/2011   Procedure: LEFT HEART CATHETERIZATION WITH CORONARY ANGIOGRAM;  Surgeon: Troy Sine, MD;  Location: [facility] CATH LAB;  Service: Cardiovascular;;For anterior STEMI/cardiac arrest -- 100% mid LAD occlusion   PERCUTANEOUS CORONARY STENT INTERVENTION (PCI-S) N/A 08/27/2011  Procedure: PERCUTANEOUS CORONARY STENT INTERVENTION (PCI-S);  Surgeon: Troy Sine, MD;  [facility] CATH LAB;  Service: Cardiovascular;;  mid LAD PCI --> Promus Element DES 3.5 mm at 24 mm (3.72 mm)   TRANSTHORACIC ECHOCARDIOGRAM  08/2011   EF 40-45%, moderate at K. of mid and distal inferior septum and anterior apical myocardium. Grade 1 diastolic function. -- Followup echocardiogram to reassess his EF was denied by insurance company   TRANSTHORACIC ECHOCARDIOGRAM  07/20/2020    EF 40 to 45% (similar to 2012) stable wall motion normality  (severe HK of midapical anterior apical wall-consistent with prior infarct).  GRII DD.  Normal valves. => Stable compared to 08/30/2011.  EF was still 40 to 45%.    Family History  Problem Relation Age of Onset   Hypertension Mother    Breast cancer Mother 72       early 71's   Heart failure Father    Heart attack Paternal Grandfather    Breast cancer Sister 75   Skin cancer Sister 60       face    Social History:  reports that she quit smoking about 11 years ago. Her smoking use included cigarettes. She has a 40.00 pack-year smoking history. She has never used smokeless tobacco. She reports that she does not drink alcohol and does not use drugs.  Allergies: No Known Allergies  Medications: I have reviewed the patient's current medications.  Results for orders placed or performed during the hospital encounter of 10/17/22 (from the past 48 hour(s))  I-Stat Chem 8, ED     Status: Abnormal   Collection Time: 10/17/22 11:40 AM  Result Value Ref Range   Sodium 139 135 - 145 mmol/L   Potassium 3.5 3.5 - 5.1 mmol/L   Chloride 104 98 - 111 mmol/L   BUN 19 8 - 23 mg/dL   Creatinine, Ser 2.10 (H) 0.44 - 1.00 mg/dL   Glucose, Bld 110 (H) 70 - 99 mg/dL    Comment: Glucose reference range applies only to samples taken after fasting for at least 8 hours.   Calcium, Ion 1.19 1.15 - 1.40 mmol/L   TCO2 26 22 - 32 mmol/L   Hemoglobin 12.2 12.0 - 15.0 g/dL   HCT 36.0 36.0 - 46.0 %  Comprehensive metabolic panel     Status: Abnormal   Collection Time: 10/17/22 11:48 AM  Result Value Ref Range   Sodium 135 135 - 145 mmol/L   Potassium 3.3 (L) 3.5 - 5.1 mmol/L   Chloride 101 98 - 111 mmol/L   CO2 25 22 - 32 mmol/L   Glucose, Bld 117 (H) 70 - 99 mg/dL    Comment: Glucose reference range applies only to samples taken after fasting for at least 8 hours.   BUN 18 8 - 23 mg/dL   Creatinine, Ser 1.90 (H) 0.44 - 1.00 mg/dL   Calcium 9.5 8.9 - 10.3 mg/dL   Total Protein 6.6 6.5 - 8.1 g/dL    Albumin 3.6 3.5 - 5.0 g/dL   AST 19 15 - 41 U/L   ALT 15 0 - 44 U/L   Alkaline Phosphatase 66 38 - 126 U/L   Total Bilirubin 0.3 0.3 - 1.2 mg/dL   GFR, Estimated 28 (L) >60 mL/min    Comment: (NOTE) Calculated using the CKD-EPI Creatinine Equation (2021)    Anion gap 9 5 - 15    Comment: Performed at [facility] [address] [place], [place] [address]  CBC  Status: Abnormal   Collection Time: 10/17/22 11:48 AM  Result Value Ref Range   WBC 10.4 4.0 - 10.5 K/uL   RBC 4.05 3.87 - 5.11 MIL/uL   Hemoglobin 11.9 (L) 12.0 - 15.0 g/dL   HCT 37.3 36.0 - 46.0 %   MCV 92.1 80.0 - 100.0 fL   MCH 29.4 26.0 - 34.0 pg   MCHC 31.9 30.0 - 36.0 g/dL   RDW 13.3 11.5 - 15.5 %   Platelets 230 150 - 400 K/uL   nRBC 0.0 0.0 - 0.2 %    Comment: Performed at [facility], [place] [address] [place], [place] [address]  Protime-INR     Status: None   Collection Time: 10/17/22 11:48 AM  Result Value Ref Range   Prothrombin Time 13.4 11.4 - 15.2 seconds   INR 1.0 0.8 - 1.2    Comment: (NOTE) INR goal varies based on device and disease states. Performed at [facility], [place] [address], [place], [place] [address]   Ethanol     Status: None   Collection Time: 10/17/22 11:49 AM  Result Value Ref Range   Alcohol, Ethyl (B) <10 <10 mg/dL    Comment: (NOTE) Lowest detectable limit for serum alcohol is 10 mg/dL.  For medical purposes only. Performed at [facility], [place] [address], [place], [place] [address]     DG Pelvis Portable  Result Date: 10/17/2022 CLINICAL DATA:  Trauma EXAM: PORTABLE PELVIS 1-2 VIEWS COMPARISON:  None Available. FINDINGS: Basicervical to intertrochanteric fracture of the left femur. IMPRESSION: Basicervical to intertrochanteric fracture of the left femur. Electronically Signed   By: Nelson Chimes M.D.   On: 10/17/2022 12:37   DG Chest Port 1 View  Result Date: 10/17/2022 CLINICAL DATA:  Pain after trauma EXAM: PORTABLE CHEST 1 VIEW COMPARISON:   Chest CT 05/18/2021.  X-ray 2013 FINDINGS: Hyperinflation. Enlarged cardiopericardial silhouette. Tortuous and ectatic aorta. No consolidation, pneumothorax or effusion. No edema. Overlapping cardiac leads. Surgical clips in the right axillary region. IMPRESSION: Hyperinflation.  Mild enlarged heart.  Calcified tortuous aorta Electronically Signed   By: Jill Side M.D.   On: 10/17/2022 12:35   CT HEAD WO CONTRAST  Result Date: 10/17/2022 CLINICAL DATA:  Fall, head injury.  On Plavix EXAM: CT HEAD WITHOUT CONTRAST CT CERVICAL SPINE WITHOUT CONTRAST TECHNIQUE: Multidetector CT imaging of the head and cervical spine was performed following the standard protocol without intravenous contrast. Multiplanar CT image reconstructions of the cervical spine were also generated. RADIATION DOSE REDUCTION: This exam was performed according to the departmental dose-optimization program which includes automated exposure control, adjustment of the mA and/or kV according to patient size and/or use of iterative reconstruction technique. COMPARISON:  None Available. FINDINGS: CT HEAD FINDINGS Brain: Ventricle size normal.  No acute infarct or hemorrhage. 1 cm calcified lesion to the left of the falx in the posterior frontal lobe likely meningioma. No brain edema. Additional 5 mm dural base calcification left frontotemporal lobe may be benign dural calcification or meningioma as well. Patchy white matter hypodensity bilaterally most compatible with chronic microvascular ischemia. Vascular: Negative for hyperdense vessel Skull: Negative Sinuses/Orbits: Paranasal sinuses clear. Bilateral cataract extraction Other: None CT CERVICAL SPINE FINDINGS Alignment: Mild anterolisthesis C3-4 and C4-5 Skull base and vertebrae: Negative for fracture Soft tissues and spinal canal: Right upper pole thyroid nodule 2.3 cm in diameter. Disc levels: Cervical spondylosis with disc degeneration and facet degeneration. Spinal and foraminal stenosis  bilaterally most prominent at C5-6.  Upper chest: Visualized lung apices clear bilaterally Other: None IMPRESSION: 1. No acute intracranial abnormality. Chronic microvascular ischemic change in the white matter. 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 Additional 5 mm calcification left frontotemporal lobe may be benign dural calcification or meningioma. 2. Cervical spondylosis. Negative for cervical fracture. 3. 2.3 cm right thyroid nodule. Recommend thyroid ultrasound. (Ref: J Am Coll Radiol. 2015 Feb;12(2): 143-50). Electronically Signed   By: Franchot Gallo M.D.   On: 10/17/2022 12:34   CT CERVICAL SPINE WO CONTRAST  Result Date: 10/17/2022 CLINICAL DATA:  Fall, head injury.  On Plavix EXAM: CT HEAD WITHOUT CONTRAST CT CERVICAL SPINE WITHOUT CONTRAST TECHNIQUE: Multidetector CT imaging of the head and cervical spine was performed following the standard protocol without intravenous contrast. Multiplanar CT image reconstructions of the cervical spine were also generated. RADIATION DOSE REDUCTION: This exam was performed according to the departmental dose-optimization program which includes automated exposure control, adjustment of the mA and/or kV according to patient size and/or use of iterative reconstruction technique. COMPARISON:  None Available. FINDINGS: CT HEAD FINDINGS Brain: Ventricle size normal.  No acute infarct or hemorrhage. 1 cm calcified lesion to the left of the falx in the posterior frontal lobe likely meningioma. No brain edema. Additional 5 mm dural base calcification left frontotemporal lobe may be benign dural calcification or meningioma as well. Patchy white matter hypodensity bilaterally most compatible with chronic microvascular ischemia. Vascular: Negative for hyperdense vessel Skull: Negative Sinuses/Orbits: Paranasal sinuses clear. Bilateral cataract extraction Other: None CT CERVICAL SPINE FINDINGS Alignment: Mild anterolisthesis C3-4 and C4-5 Skull base and vertebrae:  Negative for fracture Soft tissues and spinal canal: Right upper pole thyroid nodule 2.3 cm in diameter. Disc levels: Cervical spondylosis with disc degeneration and facet degeneration. Spinal and foraminal stenosis bilaterally most prominent at C5-6. Upper chest: Visualized lung apices clear bilaterally Other: None IMPRESSION: 1. No acute intracranial abnormality. Chronic microvascular ischemic change in the white matter. 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 Additional 5 mm calcification left frontotemporal lobe may be benign dural calcification or meningioma. 2. Cervical spondylosis. Negative for cervical fracture. 3. 2.3 cm right thyroid nodule. Recommend thyroid ultrasound. (Ref: J Am Coll Radiol. 2015 Feb;12(2): 143-50). Electronically Signed   By: Franchot Gallo M.D.   On: 10/17/2022 12:34   DG FEMUR PORT 1V LEFT  Result Date: 10/17/2022 CLINICAL DATA:  Blunt trauma.  Pain EXAM: LEFT FEMUR PORTABLE 1 VIEW COMPARISON:  None Available. FINDINGS: There is cortical irregularity along the femoral neck through the intratrochanteric region typically with irregularity along the lesser trochanter. Subtle nondisplaced fracture is possible. Preserved bone mineralization. Degenerative changes of the hip joint with joint space loss and osteophytes. IMPRESSION: Cortical irregularity along the intertrochanteric region and lower femoral neck involving the lesser trochanter worrisome for nondisplaced fracture. Dedicated hip imaging with oblique film could be some use to further delineate Electronically Signed   By: Jill Side M.D.   On: 10/17/2022 12:34    Review of Systems  HENT:  Negative for ear discharge, ear pain, hearing loss and tinnitus.   Eyes:  Negative for photophobia and pain.  Respiratory:  Negative for cough and shortness of breath.   Cardiovascular:  Negative for chest pain.  Gastrointestinal:  Negative for abdominal pain, nausea and vomiting.  Genitourinary:  Negative for dysuria,  flank pain, frequency and urgency.  Musculoskeletal:  Positive for arthralgias (Left hip). Negative for back pain, myalgias and neck pain.  Neurological:  Negative for dizziness and headaches.  Hematological:  Does not bruise/bleed easily.  Psychiatric/Behavioral:  The patient is not nervous/anxious.    Blood pressure (!) 144/78, pulse 84, temperature 98.5 F (36.9 C), temperature source Oral, resp. rate 18, height '5\' 1"'$  (1.549 m), weight 73.9 kg, SpO2 95 %. Physical Exam Constitutional:      General: She is not in acute distress.    Appearance: She is well-developed. She is not diaphoretic.  HENT:     Head: Normocephalic and atraumatic.  Eyes:     General: No scleral icterus.       Right eye: No discharge.        Left eye: No discharge.     Conjunctiva/sclera: Conjunctivae normal.  Cardiovascular:     Rate and Rhythm: Normal rate and regular rhythm.  Pulmonary:     Effort: Pulmonary effort is normal. No respiratory distress.  Musculoskeletal:     Cervical back: Normal range of motion.     Comments: LLE No traumatic wounds, ecchymosis, or rash  Mod TTP hip  No knee or ankle effusion  Knee stable to varus/ valgus and anterior/posterior stress  Sens DPN, SPN, TN intact  Motor EHL, ext, flex, evers 5/5  DP 2+, PT 2+, No significant edema  Skin:    General: Skin is warm and dry.  Neurological:     Mental Status: She is alert.  Psychiatric:        Mood and Affect: Mood normal.        Behavior: Behavior normal.     Assessment/Plan: Left hip fx -- Dr. Marcelino Scot to review. Will need IMN. Timing TBD. Multiple medical problems including CAD status post STEMI and DES, peri-infarct V. tach, COPD, CKD 4, RA, RLS, HLD, hypertension, obesity, and breast cancer in remission -- per primary service    Lisette Abu, PA-C Orthopedic Surgery [phone] 10/17/2022, 4:43 PM

## 2022-10-17 NOTE — ED Notes (Signed)
Trauma Response Nurse Documentation  Samantha Mcbride is a 72 y.o. female arriving to [facility] ED via EMS  On clopidogrel 75 mg daily. Trauma was activated as a Level 2 based on the following trauma criteria Elderly patients > 65 with head trauma on anti-coagulation (excluding ASA). Trauma team at the bedside on patient arrival.   Patient cleared for CT by Britni PA. Pt transported to CT with trauma response nurse present to monitor. RN remained with the patient throughout their absence from the department for clinical observation. GCS 15.  History   Past Medical History:  Diagnosis Date   Anemia    iron deficiency anemia    CAD S/P percutaneous coronary angioplasty 08/2011   Promus DES - mid LAD 3.5 mm x 24 mm (3.72 mm)    Cancer (HCC)    Chronic kidney disease    stage 4 kidney disease per pt dx 03/4331   Complication of anesthesia    Dyslipidemia, goal LDL below 70     on statin, close to goal   Family history of breast cancer    Family history of skin cancer    Former moderate cigarette smoker (10-19 per day)    Glucose intolerance (impaired glucose tolerance)    H/O thyroid nodule    Benign   History 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 --> Promus DES =>  EF 40 to 45% (similar to 2012) stable wall motion normality (severe HK of midapical anterior apical wall-consistent with prior infarct).  GRII DD.  Normal valves. => Stable compared to 08/30/2011.  EF was still 40 to 45%.   Hypertension    Personal history of radiation therapy    PONV (postoperative nausea and vomiting)    no issues with surgery on 05-11-2019   Rheumatoid arthritis ([place])    managed on humira    SOB (shortness of breath) on exertion    reports "its been that way since my heart attack " repots no recurrence of MI sx since that time      Past Surgical History:  Procedure Laterality Date   ABDOMINAL AORTAGRAM N/A 08/27/2011    Procedure: ABDOMINAL Maxcine Ham;  Surgeon: Troy Sine, MD;  Location: [facility] CATH LAB;  Service: Cardiovascular;  Laterality: N/A;   ANKLE SURGERY Right 1995   per pt ankle surgery here at [place] EXCISIONAL BIOPSY Left    BREAST LUMPECTOMY Right 07/2020   BREAST LUMPECTOMY WITH RADIOACTIVE SEED AND SENTINEL LYMPH NODE BIOPSY Right 08/05/2020   Procedure: RIGHT BREAST LUMPECTOMY WITH RADIOACTIVE SEED AND SENTINEL LYMPH NODE BIOPSY;  Surgeon: [name], MD;  Location: [place];  Service: General;  Laterality: Right;   CYSTOSCOPY WITH RETROGRADE PYELOGRAM, URETEROSCOPY AND STENT PLACEMENT Bilateral 05/11/2019   Procedure: CYSTOSCOPY WITH RETROGRADE PYELOGRAM,  AND STENT PLACEMENT;  Surgeon: Lucas Mallow, MD;  [facility];  Service: Urology;  Laterality: Bilateral;   CYSTOSCOPY/URETEROSCOPY/HOLMIUM LASER/STENT PLACEMENT Bilateral 05/25/2019   Procedure: CYSTOSCOPY BILATERAL URETEROSCOPY/HOLMIUM LASER/STENT PLACEMENT;  Surgeon: Lucas Mallow, MD;  [facility];  Service: Urology;  Laterality: Bilateral;   LEFT HEART CATHETERIZATION WITH CORONARY ANGIOGRAM N/A 08/27/2011   Procedure: LEFT HEART CATHETERIZATION WITH CORONARY ANGIOGRAM;  Surgeon: Troy Sine, MD;  Location: [facility] CATH LAB;  Service: Cardiovascular;;For anterior STEMI/cardiac arrest -- 100% mid LAD occlusion   PERCUTANEOUS CORONARY STENT INTERVENTION (PCI-S) N/A 08/27/2011  Procedure: PERCUTANEOUS CORONARY STENT INTERVENTION (PCI-S);  Surgeon: Troy Sine, MD;  Location: [facility] CATH LAB;  Service: Cardiovascular;;  mid LAD PCI --> Promus Element DES 3.5 mm at 24 mm (3.72 mm)   TRANSTHORACIC ECHOCARDIOGRAM  08/2011   EF 40-45%, moderate at K. of mid and distal inferior septum and anterior apical myocardium. Grade 1 diastolic function. -- Followup echocardiogram to reassess his EF was denied by insurance company   TRANSTHORACIC ECHOCARDIOGRAM  07/20/2020    EF 40 to 45% (similar to 2012) stable  wall motion normality (severe HK of midapical anterior apical wall-consistent with prior infarct).  GRII DD.  Normal valves. => Stable compared to 08/30/2011.  EF was still 40 to 45%.     Initial Focused Assessment (If applicable, or please see trauma documentation): Patient A&Ox4, GCS 15, PERR 3 Mechanical fall after being knocked down by dog, fell back onto concrete  CT's Completed:   CT Head and CT C-Spine   Interventions:  IV, labs CXR/PXR/Hip XR CT Head/Cspine  Plan for disposition:  Admission to floor   Consults completed:  Orthopaedic Surgeon at see notes.  Event Summary: Patient to ED via EMS after mechanical fall onto concrete. Imaging revealed a left hip fx requiring IM nailing. Will be admitted to hospitalist service.  Bedside handoff with ED RN Precious Bard.    Samantha Mcbride  Trauma Response RN  Please call TRN at [phone] for further assistance.

## 2022-10-18 ENCOUNTER — Inpatient Hospital Stay (HOSPITAL_COMMUNITY): Payer: Medicare Other | Admitting: Anesthesiology

## 2022-10-18 ENCOUNTER — Inpatient Hospital Stay (HOSPITAL_COMMUNITY): Payer: Medicare Other

## 2022-10-18 ENCOUNTER — Encounter (HOSPITAL_COMMUNITY)
Admission: EM | Disposition: A | Discharge status: Home / Self Care | Payer: Self-pay | Source: Home / Self Care | Attending: Internal Medicine

## 2022-10-18 ENCOUNTER — Encounter (HOSPITAL_COMMUNITY): Payer: Self-pay | Admitting: Internal Medicine

## 2022-10-18 ENCOUNTER — Other Ambulatory Visit: Payer: Self-pay

## 2022-10-18 DIAGNOSIS — Z923 Personal history of irradiation: Secondary | ICD-10-CM | POA: Diagnosis not present

## 2022-10-18 DIAGNOSIS — W01198A Fall on same level from slipping, tripping and stumbling with subsequent striking against other object, initial encounter: Secondary | ICD-10-CM | POA: Diagnosis present

## 2022-10-18 DIAGNOSIS — D62 Acute posthemorrhagic anemia: Secondary | ICD-10-CM | POA: Diagnosis not present

## 2022-10-18 DIAGNOSIS — G2581 Restless legs syndrome: Secondary | ICD-10-CM | POA: Diagnosis present

## 2022-10-18 DIAGNOSIS — D509 Iron deficiency anemia, unspecified: Secondary | ICD-10-CM | POA: Diagnosis present

## 2022-10-18 DIAGNOSIS — N184 Chronic kidney disease, stage 4 (severe): Secondary | ICD-10-CM | POA: Diagnosis present

## 2022-10-18 DIAGNOSIS — Y92014 Private driveway to single-family (private) house as the place of occurrence of the external cause: Secondary | ICD-10-CM | POA: Diagnosis not present

## 2022-10-18 DIAGNOSIS — E1122 Type 2 diabetes mellitus with diabetic chronic kidney disease: Secondary | ICD-10-CM | POA: Diagnosis present

## 2022-10-18 DIAGNOSIS — Z79899 Other long term (current) drug therapy: Secondary | ICD-10-CM | POA: Diagnosis not present

## 2022-10-18 DIAGNOSIS — E785 Hyperlipidemia, unspecified: Secondary | ICD-10-CM | POA: Diagnosis present

## 2022-10-18 DIAGNOSIS — I2510 Atherosclerotic heart disease of native coronary artery without angina pectoris: Secondary | ICD-10-CM

## 2022-10-18 DIAGNOSIS — S72102A Unspecified trochanteric fracture of left femur, initial encounter for closed fracture: Secondary | ICD-10-CM | POA: Diagnosis not present

## 2022-10-18 DIAGNOSIS — S72145A Nondisplaced intertrochanteric fracture of left femur, initial encounter for closed fracture: Secondary | ICD-10-CM | POA: Diagnosis not present

## 2022-10-18 DIAGNOSIS — Z87891 Personal history of nicotine dependence: Secondary | ICD-10-CM

## 2022-10-18 DIAGNOSIS — S72002A Fracture of unspecified part of neck of left femur, initial encounter for closed fracture: Secondary | ICD-10-CM | POA: Diagnosis not present

## 2022-10-18 DIAGNOSIS — M2170 Unequal limb length (acquired), unspecified site: Secondary | ICD-10-CM | POA: Diagnosis present

## 2022-10-18 DIAGNOSIS — Z9889 Other specified postprocedural states: Secondary | ICD-10-CM | POA: Diagnosis not present

## 2022-10-18 DIAGNOSIS — Z853 Personal history of malignant neoplasm of breast: Secondary | ICD-10-CM | POA: Diagnosis not present

## 2022-10-18 DIAGNOSIS — C50919 Malignant neoplasm of unspecified site of unspecified female breast: Secondary | ICD-10-CM | POA: Diagnosis not present

## 2022-10-18 DIAGNOSIS — M1612 Unilateral primary osteoarthritis, left hip: Secondary | ICD-10-CM | POA: Diagnosis not present

## 2022-10-18 DIAGNOSIS — S72142A Displaced intertrochanteric fracture of left femur, initial encounter for closed fracture: Secondary | ICD-10-CM | POA: Diagnosis present

## 2022-10-18 DIAGNOSIS — I129 Hypertensive chronic kidney disease with stage 1 through stage 4 chronic kidney disease, or unspecified chronic kidney disease: Secondary | ICD-10-CM

## 2022-10-18 DIAGNOSIS — I252 Old myocardial infarction: Secondary | ICD-10-CM | POA: Diagnosis not present

## 2022-10-18 DIAGNOSIS — E669 Obesity, unspecified: Secondary | ICD-10-CM | POA: Diagnosis present

## 2022-10-18 DIAGNOSIS — M069 Rheumatoid arthritis, unspecified: Secondary | ICD-10-CM | POA: Diagnosis present

## 2022-10-18 DIAGNOSIS — E1169 Type 2 diabetes mellitus with other specified complication: Secondary | ICD-10-CM | POA: Diagnosis present

## 2022-10-18 DIAGNOSIS — E876 Hypokalemia: Secondary | ICD-10-CM | POA: Diagnosis present

## 2022-10-18 DIAGNOSIS — W19XXXA Unspecified fall, initial encounter: Secondary | ICD-10-CM | POA: Diagnosis not present

## 2022-10-18 DIAGNOSIS — D631 Anemia in chronic kidney disease: Secondary | ICD-10-CM | POA: Diagnosis not present

## 2022-10-18 DIAGNOSIS — E041 Nontoxic single thyroid nodule: Secondary | ICD-10-CM | POA: Diagnosis present

## 2022-10-18 DIAGNOSIS — S72009A Fracture of unspecified part of neck of unspecified femur, initial encounter for closed fracture: Secondary | ICD-10-CM | POA: Diagnosis present

## 2022-10-18 DIAGNOSIS — S0003XA Contusion of scalp, initial encounter: Secondary | ICD-10-CM | POA: Diagnosis present

## 2022-10-18 DIAGNOSIS — Z6830 Body mass index (BMI) 30.0-30.9, adult: Secondary | ICD-10-CM | POA: Diagnosis not present

## 2022-10-18 DIAGNOSIS — J439 Emphysema, unspecified: Secondary | ICD-10-CM | POA: Diagnosis present

## 2022-10-18 DIAGNOSIS — N189 Chronic kidney disease, unspecified: Secondary | ICD-10-CM | POA: Diagnosis not present

## 2022-10-18 HISTORY — PX: INTRAMEDULLARY (IM) NAIL INTERTROCHANTERIC: SHX5875

## 2022-10-18 LAB — CBC
HCT: 31 % — ABNORMAL LOW (ref 36.0–46.0)
Hemoglobin: 10 g/dL — ABNORMAL LOW (ref 12.0–15.0)
MCH: 29.2 pg (ref 26.0–34.0)
MCHC: 32.3 g/dL (ref 30.0–36.0)
MCV: 90.6 fL (ref 80.0–100.0)
Platelets: 177 10*3/uL (ref 150–400)
RBC: 3.42 MIL/uL — ABNORMAL LOW (ref 3.87–5.11)
RDW: 13.2 % (ref 11.5–15.5)
WBC: 8.6 10*3/uL (ref 4.0–10.5)
nRBC: 0 % (ref 0.0–0.2)

## 2022-10-18 LAB — BASIC METABOLIC PANEL
Anion gap: 10 (ref 5–15)
BUN: 18 mg/dL (ref 8–23)
CO2: 24 mmol/L (ref 22–32)
Calcium: 8.9 mg/dL (ref 8.9–10.3)
Chloride: 105 mmol/L (ref 98–111)
Creatinine, Ser: 1.87 mg/dL — ABNORMAL HIGH (ref 0.44–1.00)
GFR, Estimated: 29 mL/min — ABNORMAL LOW (ref >60)
Glucose, Bld: 95 mg/dL (ref 70–99)
Potassium: 3.5 mmol/L (ref 3.5–5.1)
Sodium: 139 mmol/L (ref 135–145)

## 2022-10-18 LAB — SURGICAL PCR SCREEN
MRSA, PCR: NEGATIVE
Staphylococcus aureus: NEGATIVE

## 2022-10-18 LAB — HIV ANTIBODY (ROUTINE TESTING W REFLEX): HIV Screen 4th Generation wRfx: NONREACTIVE

## 2022-10-18 SURGERY — FIXATION, FRACTURE, INTERTROCHANTERIC, WITH INTRAMEDULLARY ROD
Anesthesia: General | Site: Leg Upper | Laterality: Left

## 2022-10-18 MED ORDER — FENTANYL CITRATE (PF) 100 MCG/2ML IJ SOLN
INTRAMUSCULAR | Status: AC
  Filled 2022-10-18: qty 2

## 2022-10-18 MED ORDER — ONDANSETRON HCL 4 MG/2ML IJ SOLN: 4.0000 mg | Freq: Four times a day (QID) | INTRAMUSCULAR | Status: DC | PRN

## 2022-10-18 MED ORDER — OXYCODONE HCL 5 MG/5ML PO SOLN: 5.0000 mg | Freq: Once | ORAL | Status: DC | PRN

## 2022-10-18 MED ORDER — PHENYLEPHRINE HCL-NACL 20-0.9 MG/250ML-% IV SOLN
INTRAVENOUS | Status: DC | PRN
  Administered 2022-10-18: 160 ug via INTRAVENOUS
  Administered 2022-10-18: 30 ug/min via INTRAVENOUS

## 2022-10-18 MED ORDER — METOCLOPRAMIDE HCL 5 MG/ML IJ SOLN: 5.0000 mg | Freq: Three times a day (TID) | INTRAMUSCULAR | Status: DC | PRN

## 2022-10-18 MED ORDER — CHLORHEXIDINE GLUCONATE 0.12 % MT SOLN
OROMUCOSAL | Status: AC
  Administered 2022-10-18: 15 mL via OROMUCOSAL
  Filled 2022-10-18: qty 15

## 2022-10-18 MED ORDER — LACTATED RINGERS IV SOLN: INTRAVENOUS | Status: DC

## 2022-10-18 MED ORDER — ROCURONIUM BROMIDE 10 MG/ML (PF) SYRINGE
PREFILLED_SYRINGE | INTRAVENOUS | Status: AC
  Filled 2022-10-18: qty 10

## 2022-10-18 MED ORDER — MIDAZOLAM HCL 2 MG/2ML IJ SOLN
INTRAMUSCULAR | Status: AC
  Filled 2022-10-18: qty 2

## 2022-10-18 MED ORDER — PROPOFOL 10 MG/ML IV BOLUS
INTRAVENOUS | Status: DC | PRN
  Administered 2022-10-18: 150 mg via INTRAVENOUS
  Administered 2022-10-18: 50 mg via INTRAVENOUS

## 2022-10-18 MED ORDER — ONDANSETRON HCL 4 MG PO TABS: 4.0000 mg | ORAL_TABLET | Freq: Four times a day (QID) | ORAL | Status: DC | PRN

## 2022-10-18 MED ORDER — DEXAMETHASONE SODIUM PHOSPHATE 10 MG/ML IJ SOLN
INTRAMUSCULAR | Status: AC
  Filled 2022-10-18: qty 1

## 2022-10-18 MED ORDER — CEFAZOLIN SODIUM-DEXTROSE 2-4 GM/100ML-% IV SOLN
2.0000 g | INTRAVENOUS | Status: AC
  Administered 2022-10-18: 2 g via INTRAVENOUS
  Filled 2022-10-18: qty 100

## 2022-10-18 MED ORDER — POVIDONE-IODINE 10 % EX SWAB
2.0000 | Freq: Once | CUTANEOUS | Status: AC
  Administered 2022-10-18: 2 via TOPICAL

## 2022-10-18 MED ORDER — CHLORHEXIDINE GLUCONATE 4 % EX LIQD: 60.0000 mL | Freq: Once | CUTANEOUS | Status: DC

## 2022-10-18 MED ORDER — CEFAZOLIN SODIUM-DEXTROSE 2-4 GM/100ML-% IV SOLN
2.0000 g | Freq: Two times a day (BID) | INTRAVENOUS | Status: AC
  Administered 2022-10-19: 2 g via INTRAVENOUS
  Filled 2022-10-18: qty 100

## 2022-10-18 MED ORDER — DOCUSATE SODIUM 100 MG PO CAPS
100.0000 mg | ORAL_CAPSULE | Freq: Two times a day (BID) | ORAL | Status: DC
  Administered 2022-10-18 – 2022-10-25 (×8): 100 mg via ORAL
  Filled 2022-10-18 (×14): qty 1

## 2022-10-18 MED ORDER — FENTANYL CITRATE (PF) 100 MCG/2ML IJ SOLN
25.0000 ug | INTRAMUSCULAR | Status: DC | PRN
  Administered 2022-10-18: 25 ug via INTRAVENOUS

## 2022-10-18 MED ORDER — CLOPIDOGREL BISULFATE 75 MG PO TABS: 75.0000 mg | ORAL_TABLET | Freq: Every day | ORAL | Status: DC

## 2022-10-18 MED ORDER — LIDOCAINE 2% (20 MG/ML) 5 ML SYRINGE
INTRAMUSCULAR | Status: DC | PRN
  Administered 2022-10-18: 60 mg via INTRAVENOUS

## 2022-10-18 MED ORDER — FENTANYL CITRATE (PF) 250 MCG/5ML IJ SOLN
INTRAMUSCULAR | Status: AC
  Filled 2022-10-18: qty 5

## 2022-10-18 MED ORDER — PHENOL 1.4 % MT LIQD: 1.0000 | OROMUCOSAL | Status: DC | PRN

## 2022-10-18 MED ORDER — LIDOCAINE 2% (20 MG/ML) 5 ML SYRINGE
INTRAMUSCULAR | Status: AC
  Filled 2022-10-18: qty 5

## 2022-10-18 MED ORDER — TRANEXAMIC ACID-NACL 1000-0.7 MG/100ML-% IV SOLN
1000.0000 mg | Freq: Once | INTRAVENOUS | Status: AC
  Administered 2022-10-18: 1000 mg via INTRAVENOUS
  Filled 2022-10-18: qty 100

## 2022-10-18 MED ORDER — ORAL CARE MOUTH RINSE: 15.0000 mL | Freq: Once | OROMUCOSAL | Status: AC

## 2022-10-18 MED ORDER — 0.9 % SODIUM CHLORIDE (POUR BTL) OPTIME
TOPICAL | Status: DC | PRN
  Administered 2022-10-18: 1000 mL

## 2022-10-18 MED ORDER — OXYCODONE HCL 5 MG PO TABS: 5.0000 mg | ORAL_TABLET | Freq: Once | ORAL | Status: DC | PRN

## 2022-10-18 MED ORDER — CHLORHEXIDINE GLUCONATE CLOTH 2 % EX PADS
6.0000 | MEDICATED_PAD | Freq: Every day | CUTANEOUS | Status: DC
  Administered 2022-10-19 – 2022-10-22 (×4): 6 via TOPICAL

## 2022-10-18 MED ORDER — MIDAZOLAM HCL 5 MG/5ML IJ SOLN
INTRAMUSCULAR | Status: DC | PRN
  Administered 2022-10-18: 2 mg via INTRAVENOUS

## 2022-10-18 MED ORDER — CLOPIDOGREL BISULFATE 75 MG PO TABS
75.0000 mg | ORAL_TABLET | Freq: Every day | ORAL | Status: DC
  Administered 2022-10-19 – 2022-10-27 (×9): 75 mg via ORAL
  Filled 2022-10-18 (×9): qty 1

## 2022-10-18 MED ORDER — MENTHOL 3 MG MT LOZG: 1.0000 | LOZENGE | OROMUCOSAL | Status: DC | PRN

## 2022-10-18 MED ORDER — ONDANSETRON HCL 4 MG/2ML IJ SOLN
INTRAMUSCULAR | Status: DC | PRN
  Administered 2022-10-18: 4 mg via INTRAVENOUS

## 2022-10-18 MED ORDER — SUGAMMADEX SODIUM 200 MG/2ML IV SOLN
INTRAVENOUS | Status: DC | PRN
  Administered 2022-10-18: 200 mg via INTRAVENOUS

## 2022-10-18 MED ORDER — FENTANYL CITRATE (PF) 250 MCG/5ML IJ SOLN
INTRAMUSCULAR | Status: DC | PRN
  Administered 2022-10-18: 100 ug via INTRAVENOUS
  Administered 2022-10-18 (×3): 50 ug via INTRAVENOUS

## 2022-10-18 MED ORDER — DEXAMETHASONE SODIUM PHOSPHATE 10 MG/ML IJ SOLN
INTRAMUSCULAR | Status: DC | PRN
  Administered 2022-10-18: 5 mg via INTRAVENOUS

## 2022-10-18 MED ORDER — LABETALOL HCL 5 MG/ML IV SOLN
INTRAVENOUS | Status: AC
  Filled 2022-10-18: qty 4

## 2022-10-18 MED ORDER — PROPOFOL 1000 MG/100ML IV EMUL
INTRAVENOUS | Status: AC
  Filled 2022-10-18: qty 100

## 2022-10-18 MED ORDER — CHLORHEXIDINE GLUCONATE 0.12 % MT SOLN: 15.0000 mL | Freq: Once | OROMUCOSAL | Status: AC

## 2022-10-18 MED ORDER — ONDANSETRON HCL 4 MG/2ML IJ SOLN
INTRAMUSCULAR | Status: AC
  Filled 2022-10-18: qty 2

## 2022-10-18 MED ORDER — ROCURONIUM BROMIDE 10 MG/ML (PF) SYRINGE
PREFILLED_SYRINGE | INTRAVENOUS | Status: DC | PRN
  Administered 2022-10-18: 60 mg via INTRAVENOUS

## 2022-10-18 MED ORDER — METOCLOPRAMIDE HCL 5 MG PO TABS: 5.0000 mg | ORAL_TABLET | Freq: Three times a day (TID) | ORAL | Status: DC | PRN

## 2022-10-18 MED ORDER — HYDROMORPHONE HCL 1 MG/ML IJ SOLN
INTRAMUSCULAR | Status: AC
  Filled 2022-10-18: qty 0.5

## 2022-10-18 MED ORDER — PROPOFOL 500 MG/50ML IV EMUL
INTRAVENOUS | Status: DC | PRN
  Administered 2022-10-18: 20 ug/kg/min via INTRAVENOUS

## 2022-10-18 MED ORDER — AMISULPRIDE (ANTIEMETIC) 5 MG/2ML IV SOLN: 10.0000 mg | Freq: Once | INTRAVENOUS | Status: DC | PRN

## 2022-10-18 SURGICAL SUPPLY — 54 items
BAG COUNTER SPONGE SURGICOUNT (BAG) ×1 IMPLANT
BIT DRILL CALIBRTD FREE HND4.3 (BIT) IMPLANT
BNDG COHESIVE 6X5 TAN STRL LF (GAUZE/BANDAGES/DRESSINGS) IMPLANT
BRUSH SCRUB EZ PLAIN DRY (MISCELLANEOUS) ×2 IMPLANT
COVER PERINEAL POST (MISCELLANEOUS) ×1 IMPLANT
COVER SURGICAL LIGHT HANDLE (MISCELLANEOUS) ×2 IMPLANT
DRAPE C-ARMOR (DRAPES) ×1 IMPLANT
DRAPE HALF SHEET 40X57 (DRAPES) IMPLANT
DRAPE INCISE IOBAN 66X45 STRL (DRAPES) ×1 IMPLANT
DRAPE ORTHO SPLIT 77X108 STRL (DRAPES) ×2
DRAPE SURG ORHT 6 SPLT 77X108 (DRAPES) IMPLANT
DRAPE U-SHAPE 47X51 STRL (DRAPES) ×1 IMPLANT
DRESSING MEPILEX FLEX 4X4 (GAUZE/BANDAGES/DRESSINGS) IMPLANT
DRILL CALIBRATED FREE HAND 4.3 (BIT) ×1
DRSG EMULSION OIL 3X3 NADH (GAUZE/BANDAGES/DRESSINGS) ×1 IMPLANT
DRSG MEPILEX BORDER 4X4 (GAUZE/BANDAGES/DRESSINGS) ×1 IMPLANT
DRSG MEPILEX BORDER 4X8 (GAUZE/BANDAGES/DRESSINGS) ×1 IMPLANT
DRSG MEPILEX FLEX 4X4 (GAUZE/BANDAGES/DRESSINGS) ×3
ELECT REM PT RETURN 9FT ADLT (ELECTROSURGICAL) ×1
ELECTRODE REM PT RTRN 9FT ADLT (ELECTROSURGICAL) ×1 IMPLANT
GLOVE BIO SURGEON STRL SZ7.5 (GLOVE) ×1 IMPLANT
GLOVE BIO SURGEON STRL SZ8 (GLOVE) ×1 IMPLANT
GLOVE BIOGEL PI IND STRL 7.5 (GLOVE) ×1 IMPLANT
GLOVE BIOGEL PI IND STRL 8 (GLOVE) ×1 IMPLANT
GLOVE SURG ORTHO LTX SZ7.5 (GLOVE) ×2 IMPLANT
GOWN STRL REUS W/ TWL LRG LVL3 (GOWN DISPOSABLE) ×2 IMPLANT
GOWN STRL REUS W/ TWL XL LVL3 (GOWN DISPOSABLE) ×1 IMPLANT
GOWN STRL REUS W/TWL LRG LVL3 (GOWN DISPOSABLE) ×2
GOWN STRL REUS W/TWL XL LVL3 (GOWN DISPOSABLE) ×1
GUIDEPIN VERSANAIL DSP 3.2X444 (ORTHOPEDIC DISPOSABLE SUPPLIES) IMPLANT
GUIDEWIRE BALL NOSE 100CM (WIRE) IMPLANT
KIT BASIN OR (CUSTOM PROCEDURE TRAY) ×1 IMPLANT
KIT TURNOVER KIT B (KITS) ×1 IMPLANT
MANIFOLD NEPTUNE II (INSTRUMENTS) ×1 IMPLANT
NS IRRIG 1000ML POUR BTL (IV SOLUTION) ×1 IMPLANT
PACK GENERAL/GYN (CUSTOM PROCEDURE TRAY) ×1 IMPLANT
PAD ARMBOARD 7.5X6 YLW CONV (MISCELLANEOUS) ×2 IMPLANT
SCREW ANTI ROTATION 80MM (Screw) IMPLANT
SCREW BONE CORTICAL 5.0X40 (Screw) IMPLANT
SCREW DRILL BIT ANIT ROTATION (BIT) IMPLANT
SCREW LAG HIP NAIL 10.5X95 (Screw) IMPLANT
SCREW LAG HIP NAIL 11X320 (Screw) IMPLANT
STAPLER VISISTAT 35W (STAPLE) ×1 IMPLANT
STOCKINETTE IMPERVIOUS LG (DRAPES) IMPLANT
SUT ETHILON 2 0 FS 18 (SUTURE) ×1 IMPLANT
SUT VIC AB 0 CT1 27 (SUTURE) ×1
SUT VIC AB 0 CT1 27XBRD ANBCTR (SUTURE) ×1 IMPLANT
SUT VIC AB 1 CT1 27 (SUTURE)
SUT VIC AB 1 CT1 27XBRD ANBCTR (SUTURE) ×1 IMPLANT
SUT VIC AB 2-0 CT1 27 (SUTURE) ×1
SUT VIC AB 2-0 CT1 TAPERPNT 27 (SUTURE) ×1 IMPLANT
TOWEL GREEN STERILE (TOWEL DISPOSABLE) ×2 IMPLANT
TOWEL GREEN STERILE FF (TOWEL DISPOSABLE) ×1 IMPLANT
WATER STERILE IRR 1000ML POUR (IV SOLUTION) ×1 IMPLANT

## 2022-10-18 NOTE — Progress Notes (Signed)
PROGRESS NOTE  Samantha Mcbride TKW:[id number] DOB: May 23, 1951 DOA: 10/17/2022 PCP: Antony Contras, MD   LOS: 0 days   Brief Narrative / Interim history: 72 year old female with history of CAD status post STEMI and DES in 2012, peri-infarct V. tach, COPD, CKD 4, RA, HLD, HTN, breast cancer in remission comes in after a ground-level fall.  Her daughter's dog ran into her and she fell backwards onto her left hip.  Imaging on admission showed left intertrochanteric femur fracture.  Orthopedics consulted, and she was admitted to the hospital  Subjective / 24h Interval events: She is doing well this morning, denies any chest pain, denies any shortness of breath.  Prior to the fall happening she was at her baseline state of health  Assesement and Plan: Principal Problem:   Intertrochanteric fracture of left hip (HCC) Active Problems:   Hyperlipidemia associated with type 2 diabetes mellitus (HCC)   Obesity (BMI 30-39.9)   H/O Anterior STEMI:  Proximal LAD  insertion of a 3.5x24 mm Promus element DES (08/2011)   CAD S/P percutaneous coronary angioplasty   Essential hypertension   Malignant neoplasm of upper-outer quadrant of right breast in female, estrogen receptor positive (HCC)   Chronic kidney disease, stage 4 (severe) (HCC)   Rheumatoid arthritis (HCC)   COPD (chronic obstructive pulmonary disease) with emphysema ([place]) noted on CT   Principal problem Left intertrochanteric hip fracture -appreciate orthopedic surgery follow-up, plan for surgical repair later today it appears.  Monitor closely postoperatively  Active problems CKD 4 -baseline creatinine 1.8-2.1, currently at baseline at 1.87.  Continue to monitor, avoid nephrotoxins  Hypokalemia-repleted, potassium 3.5 this morning  RA -continue hydroxychloroquine, leflunomide  CAD -no chest pain, this has been stable since her STEMI 11 years ago.  Seeing Dr. Ellyn Hack as an outpatient on a regular basis.  No red flag symptoms, no chest  pains, no dyspnea on exertion, no lower extremity swelling  HLD - continue statin, Zetia  HTN -continue carvedilol  History of breast cancer -outpatient follow-up  Scheduled Meds:  carvedilol  12.5 mg Oral BID   ezetimibe  10 mg Oral Daily   gabapentin  300 mg Oral QHS   hydroxychloroquine  200 mg Oral Daily   leflunomide  20 mg Oral Daily   melatonin  5 mg Oral QHS   potassium chloride  40 mEq Oral Once   rosuvastatin  20 mg Oral Daily   Continuous Infusions: PRN Meds:.HYDROcodone-acetaminophen, HYDROmorphone (DILAUDID) injection, traZODone  Current Outpatient Medications  Medication Instructions   acetaminophen (TYLENOL) 500 mg, Oral, Every 6 hours PRN   amoxicillin (AMOXIL) 500 mg, Oral, 2 times daily   Calcium Citrate-Vitamin D (CALCIUM + D PO) 1 tablet, Oral, Daily   carvedilol (COREG) 12.5 mg, Oral, 2 times daily   clopidogrel (PLAVIX) 75 MG tablet TAKE 1 TABLET BY MOUTH EVERY DAY   COVID-19 mRNA bivalent vaccine, Pfizer, (PFIZER COVID-19 VAC BIVALENT) injection Intramuscular   COVID-19 mRNA vaccine, Pfizer, 30 MCG/0.3ML injection Intramuscular   diclofenac sodium (VOLTAREN) 2 g, Topical, 4 times daily PRN   DM-APAP-CPM (CORICIDIN HBP PO) 1 tablet, Oral, Daily PRN   ezetimibe (ZETIA) 10 MG tablet TAKE 1 TABLET BY MOUTH EVERY DAY   gabapentin (NEURONTIN) 100 MG capsule TAKE 3 CAPSULES BY MOUTH AT BEDTIME.   hydroxychloroquine (PLAQUENIL) 200 mg, Oral, Daily   leflunomide (ARAVA) 20 mg, Oral, Daily   Melatonin 5 mg, Oral, Daily at bedtime   nitroGLYCERIN (NITROSTAT) 0.4 MG SL tablet PLACE 1 TABLET UNDER THE TONGUE  EVERY 5 MINUTES AS NEEDED FOR CHEST PAIN.   Olopatadine HCl (PATADAY OP) 1 drop, Daily PRN   promethazine (PHENERGAN) 25 MG suppository No dose, route, or frequency recorded.   rosuvastatin (CRESTOR) 20 MG tablet TAKE 1 TABLET BY MOUTH EVERY DAY   tiZANidine (ZANAFLEX) 2 mg, Oral, Daily at bedtime   traMADol (ULTRAM) 50 MG tablet Oral, Every 12 hours PRN    traZODone (DESYREL) 100 mg, Oral, Daily at bedtime    Diet Orders (From admission, onward)     Start     Ordered   10/17/22 1414  Diet NPO time specified  Diet effective now        10/17/22 1421            DVT prophylaxis: SCDs Start: 10/17/22 1415   Lab Results  Component Value Date   PLT 177 10/18/2022      Code Status: Full Code  Family Communication: no family at bedside   Status is: Observation The patient will require care spanning > 2 midnights and should be moved to inpatient because: surgery today    Level of care: Telemetry Medical  Consultants:  Orthopedic surgery   Objective: Vitals:   10/17/22 1721 10/17/22 2050 10/18/22 0434 10/18/22 0803  BP: (!) 170/87 (!) 165/75 135/76 (!) 150/82  Pulse: 91 89 80 84  Resp: 19 (!) '23 15 14  '$ Temp: 98.3 F (36.8 C) 98.6 F (37 C) 98.6 F (37 C) 98.5 F (36.9 C)  TempSrc: Oral Oral Oral Oral  SpO2:  91% 95% 90%  Weight:      Height:       No intake or output data in the 24 hours ending 10/18/22 1107 Wt Readings from Last 3 Encounters:  10/17/22 73.9 kg  07/24/22 73.9 kg  07/23/22 73.7 kg    Examination:  Constitutional: NAD Eyes: no scleral icterus ENMT: Mucous membranes are moist.  Neck: normal, supple Respiratory: clear to auscultation bilaterally, no wheezing, no crackles. Normal respiratory effort. No accessory muscle use.  Cardiovascular: Regular rate and rhythm, no murmurs / rubs / gallops. No LE edema.  Abdomen: non distended, no tenderness. Bowel sounds positive.  Musculoskeletal: no clubbing / cyanosis.   Data Reviewed: I have independently reviewed following labs and imaging studies   CBC Recent Labs  Lab 10/17/22 1140 10/17/22 1148 10/18/22 0404  WBC  --  10.4 8.6  HGB 12.2 11.9* 10.0*  HCT 36.0 37.3 31.0*  PLT  --  230 177  MCV  --  92.1 90.6  MCH  --  29.4 29.2  MCHC  --  31.9 32.3  RDW  --  13.3 13.2    Recent Labs  Lab 10/17/22 1140 10/17/22 1148 10/18/22 0404  NA  139 135 139  K 3.5 3.3* 3.5  CL 104 101 105  CO2  --  25 24  GLUCOSE 110* 117* 95  BUN '19 18 18  '$ CREATININE 2.10* 1.90* 1.87*  CALCIUM  --  9.5 8.9  AST  --  19  --   ALT  --  15  --   ALKPHOS  --  66  --   BILITOT  --  0.3  --   ALBUMIN  --  3.6  --   MG  --  2.2  --   INR  --  1.0  --     ------------------------------------------------------------------------------------------------------------------ No results for input(s): "CHOL", "HDL", "LDLCALC", "TRIG", "CHOLHDL", "LDLDIRECT" in the last 72 hours.  Lab Results  Component Value  Date   HGBA1C 5.6 08/07/2013   ------------------------------------------------------------------------------------------------------------------ No results for input(s): "TSH", "T4TOTAL", "T3FREE", "THYROIDAB" in the last 72 hours.  Invalid input(s): "FREET3"  Cardiac Enzymes No results for input(s): "CKMB", "TROPONINI", "MYOGLOBIN" in the last 168 hours.  Invalid input(s): "CK" ------------------------------------------------------------------------------------------------------------------ No results found for: "BNP"  CBG: No results for input(s): "GLUCAP" in the last 168 hours.  Recent Results (from the past 240 hour(s))  Surgical pcr screen     Status: None   Collection Time: 10/18/22  5:37 AM   Specimen: Nasal Mucosa; Nasal Swab  Result Value Ref Range Status   MRSA, PCR NEGATIVE NEGATIVE Final   Staphylococcus aureus NEGATIVE NEGATIVE Final    Comment: (NOTE) The Xpert SA Assay (FDA approved for NASAL specimens in patients 3 years of age and older), is one component of a comprehensive surveillance program. It is not intended to diagnose infection nor to guide or monitor treatment. Performed at [facility], [place] [address] [place], [place] [address]      Radiology Studies: DG Pelvis Portable  Result Date: 10/17/2022 CLINICAL DATA:  Trauma EXAM: PORTABLE PELVIS 1-2 VIEWS COMPARISON:  None Available. FINDINGS:  Basicervical to intertrochanteric fracture of the left femur. IMPRESSION: Basicervical to intertrochanteric fracture of the left femur. Electronically Signed   By: Nelson Chimes M.D.   On: 10/17/2022 12:37   DG Chest Port 1 View  Result Date: 10/17/2022 CLINICAL DATA:  Pain after trauma EXAM: PORTABLE CHEST 1 VIEW COMPARISON:  Chest CT 05/18/2021.  X-ray 2013 FINDINGS: Hyperinflation. Enlarged cardiopericardial silhouette. Tortuous and ectatic aorta. No consolidation, pneumothorax or effusion. No edema. Overlapping cardiac leads. Surgical clips in the right axillary region. IMPRESSION: Hyperinflation.  Mild enlarged heart.  Calcified tortuous aorta Electronically Signed   By: Jill Side M.D.   On: 10/17/2022 12:35   CT HEAD WO CONTRAST  Result Date: 10/17/2022 CLINICAL DATA:  Fall, head injury.  On Plavix EXAM: CT HEAD WITHOUT CONTRAST CT CERVICAL SPINE WITHOUT CONTRAST TECHNIQUE: Multidetector CT imaging of the head and cervical spine was performed following the standard protocol without intravenous contrast. Multiplanar CT image reconstructions of the cervical spine were also generated. RADIATION DOSE REDUCTION: This exam was performed according to the departmental dose-optimization program which includes automated exposure control, adjustment of the mA and/or kV according to patient size and/or use of iterative reconstruction technique. COMPARISON:  None Available. FINDINGS: CT HEAD FINDINGS Brain: Ventricle size normal.  No acute infarct or hemorrhage. 1 cm calcified lesion to the left of the falx in the posterior frontal lobe likely meningioma. No brain edema. Additional 5 mm dural base calcification left frontotemporal lobe may be benign dural calcification or meningioma as well. Patchy white matter hypodensity bilaterally most compatible with chronic microvascular ischemia. Vascular: Negative for hyperdense vessel Skull: Negative Sinuses/Orbits: Paranasal sinuses clear. Bilateral cataract extraction  Other: None CT CERVICAL SPINE FINDINGS Alignment: Mild anterolisthesis C3-4 and C4-5 Skull base and vertebrae: Negative for fracture Soft tissues and spinal canal: Right upper pole thyroid nodule 2.3 cm in diameter. Disc levels: Cervical spondylosis with disc degeneration and facet degeneration. Spinal and foraminal stenosis bilaterally most prominent at C5-6. Upper chest: Visualized lung apices clear bilaterally Other: None IMPRESSION: 1. No acute intracranial abnormality. Chronic microvascular ischemic change in the white matter. 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 Additional 5 mm calcification left frontotemporal lobe may be benign dural calcification or meningioma. 2. Cervical spondylosis. Negative for cervical fracture. 3. 2.3 cm right thyroid nodule. Recommend thyroid ultrasound. (  Ref: J Am Coll Radiol. 2015 Feb;12(2): 143-50). Electronically Signed   By: Franchot Gallo M.D.   On: 10/17/2022 12:34   CT CERVICAL SPINE WO CONTRAST  Result Date: 10/17/2022 CLINICAL DATA:  Fall, head injury.  On Plavix EXAM: CT HEAD WITHOUT CONTRAST CT CERVICAL SPINE WITHOUT CONTRAST TECHNIQUE: Multidetector CT imaging of the head and cervical spine was performed following the standard protocol without intravenous contrast. Multiplanar CT image reconstructions of the cervical spine were also generated. RADIATION DOSE REDUCTION: This exam was performed according to the departmental dose-optimization program which includes automated exposure control, adjustment of the mA and/or kV according to patient size and/or use of iterative reconstruction technique. COMPARISON:  None Available. FINDINGS: CT HEAD FINDINGS Brain: Ventricle size normal.  No acute infarct or hemorrhage. 1 cm calcified lesion to the left of the falx in the posterior frontal lobe likely meningioma. No brain edema. Additional 5 mm dural base calcification left frontotemporal lobe may be benign dural calcification or meningioma as well. Patchy  white matter hypodensity bilaterally most compatible with chronic microvascular ischemia. Vascular: Negative for hyperdense vessel Skull: Negative Sinuses/Orbits: Paranasal sinuses clear. Bilateral cataract extraction Other: None CT CERVICAL SPINE FINDINGS Alignment: Mild anterolisthesis C3-4 and C4-5 Skull base and vertebrae: Negative for fracture Soft tissues and spinal canal: Right upper pole thyroid nodule 2.3 cm in diameter. Disc levels: Cervical spondylosis with disc degeneration and facet degeneration. Spinal and foraminal stenosis bilaterally most prominent at C5-6. Upper chest: Visualized lung apices clear bilaterally Other: None IMPRESSION: 1. No acute intracranial abnormality. Chronic microvascular ischemic change in the white matter. 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 Additional 5 mm calcification left frontotemporal lobe may be benign dural calcification or meningioma. 2. Cervical spondylosis. Negative for cervical fracture. 3. 2.3 cm right thyroid nodule. Recommend thyroid ultrasound. (Ref: J Am Coll Radiol. 2015 Feb;12(2): 143-50). Electronically Signed   By: Franchot Gallo M.D.   On: 10/17/2022 12:34   DG FEMUR PORT 1V LEFT  Result Date: 10/17/2022 CLINICAL DATA:  Blunt trauma.  Pain EXAM: LEFT FEMUR PORTABLE 1 VIEW COMPARISON:  None Available. FINDINGS: There is cortical irregularity along the femoral neck through the intratrochanteric region typically with irregularity along the lesser trochanter. Subtle nondisplaced fracture is possible. Preserved bone mineralization. Degenerative changes of the hip joint with joint space loss and osteophytes. IMPRESSION: Cortical irregularity along the intertrochanteric region and lower femoral neck involving the lesser trochanter worrisome for nondisplaced fracture. Dedicated hip imaging with oblique film could be some use to further delineate Electronically Signed   By: Jill Side M.D.   On: 10/17/2022 12:34     Marzetta Board, MD,  PhD [facility]  Between 7 am - 7 pm I am available, please contact me via Amion (for emergencies) or Securechat (non urgent messages)  Between 7 pm - 7 am I am not available, please contact night coverage MD/APP via Amion

## 2022-10-18 NOTE — Transfer of Care (Signed)
Immediate Anesthesia Transfer of Care Note  Patient: Samantha Mcbride  Procedure(s) Performed: INTRAMEDULLARY NAILING OF LEFT FEMUR (Left: Leg Upper)  Patient Location: PACU  Anesthesia Type:General  Level of Consciousness: awake and patient cooperative  Airway & Oxygen Therapy: Patient Spontanous Breathing and Patient connected to face mask oxygen  Post-op Assessment: Report given to RN, Post -op Vital signs reviewed and stable, and Patient moving all extremities  Post vital signs: Reviewed and stable  Last Vitals:  Vitals Value Taken Time  BP 164/91 10/18/22 1715  Temp 99.6 10/18/22 1723  Pulse 92 10/18/22 1723  Resp 14 10/18/22 1723  SpO2 94 % 10/18/22 1723  Vitals shown include unvalidated device data.  Last Pain:  Vitals:   10/18/22 1255  TempSrc:   PainSc: 0-No pain      Patients Stated Pain Goal: 2 (18/20/99 0689)  Complications: No notable events documented.

## 2022-10-18 NOTE — Progress Notes (Signed)
No changes overnight.  The risks and benefits of surgery for left hip repair were discussed with the patient and her daughter, including the possibility of infection, nerve injury, vessel injury, wound breakdown, arthritis, symptomatic hardware, DVT/ PE, loss of motion, malunion, nonunion, and need for further surgery among others.  These risks were acknowledged and consent provided to proceed.  Samantha Winona, MD [facility], [facility] [phone]

## 2022-10-18 NOTE — Anesthesia Procedure Notes (Signed)
Procedure Name: Intubation Date/Time: 10/18/2022 3:09 PM  Performed by: Elvin So, CRNAPre-anesthesia Checklist: Patient identified, Emergency Drugs available, Suction available and Patient being monitored Patient Re-evaluated:Patient Re-evaluated prior to induction Oxygen Delivery Method: Circle System Utilized Preoxygenation: Pre-oxygenation with 100% oxygen Induction Type: IV induction Ventilation: Mask ventilation without difficulty Laryngoscope Size: Glidescope and 3 Grade View: Grade I Tube type: Oral Tube size: 7.0 mm Number of attempts: 1 Airway Equipment and Method: Stylet and Oral airway Placement Confirmation: ETT inserted through vocal cords under direct vision, positive ETCO2 and breath sounds checked- equal and bilateral Secured at: 21 cm Tube secured with: Tape Dental Injury: Teeth and Oropharynx as per pre-operative assessment

## 2022-10-18 NOTE — Anesthesia Preprocedure Evaluation (Addendum)
Anesthesia Evaluation  Patient identified by MRN, date of birth, ID band Patient awake    Reviewed: Allergy & Precautions, NPO status , Patient's Chart, lab work & pertinent test results  History of Anesthesia Complications (+) PONV and history of anesthetic complications  Airway Mallampati: III  TM Distance: >3 FB Neck ROM: Full    Dental no notable dental hx.    Pulmonary COPD, former smoker   Pulmonary exam normal        Cardiovascular hypertension, Pt. on medications and Pt. on home beta blockers + CAD, + Past MI and + Cardiac Stents (2012)   Rhythm:Regular Rate:Normal  ECHO 2021:  1. No LV thrombus on contrast imaging. The mid to apical anterior  segments, apical septum, and apex are severely hypokinetic consistent with  likely prior infarction. Left ventricular ejection fraction, by  estimation, is 40 to 45%. The left ventricle has   mildly decreased function. The left ventricle demonstrates regional wall  motion abnormalities (see scoring diagram/findings for description). Left  ventricular diastolic parameters are consistent with Grade II diastolic  dysfunction (pseudonormalization).   Elevated left atrial pressure.   2. Right ventricular systolic function is normal. The right ventricular  size is normal. Tricuspid regurgitation signal is inadequate for assessing  PA pressure.   3. The mitral valve is grossly normal. Trivial mitral valve  regurgitation. No evidence of mitral stenosis.   4. The aortic valve was not well visualized. Aortic valve regurgitation  is not visualized. No aortic stenosis is present.   5. The inferior vena cava is normal in size with greater than 50%  respiratory variability, suggesting right atrial pressure of 3 mmHg.     Neuro/Psych negative neurological ROS  negative psych ROS   GI/Hepatic negative GI ROS, Neg liver ROS,,,  Endo/Other  diabetes    Renal/GU CRFRenal disease   negative genitourinary   Musculoskeletal  (+) Arthritis , Rheumatoid disorders,    Abdominal Normal abdominal exam  (+)   Peds  Hematology  (+) Blood dyscrasia, anemia   Anesthesia Other Findings   Reproductive/Obstetrics                             Anesthesia Physical Anesthesia Plan  ASA: 3  Anesthesia Plan: General   Post-op Pain Management:    Induction: Intravenous  PONV Risk Score and Plan: 4 or greater and Ondansetron, Dexamethasone and Treatment may vary due to age or medical condition  Airway Management Planned: Mask and Oral ETT  Additional Equipment: None  Intra-op Plan:   Post-operative Plan: Extubation in OR  Informed Consent: I have reviewed the patients History and Physical, chart, labs and discussed the procedure including the risks, benefits and alternatives for the proposed anesthesia with the patient or authorized representative who has indicated his/her understanding and acceptance.     Dental advisory given  Plan Discussed with: CRNA  Anesthesia Plan Comments:        Anesthesia Quick Evaluation

## 2022-10-18 NOTE — TOC CAGE-AID Note (Signed)
Transition of Care [facility]) - CAGE-AID Screening   Patient Details  Name: Samantha Mcbride MRN: [id number] Date of Birth: Jun 08, 1951  Transition of Care [facility]) CM/SW Contact:    Army Melia, RN Phone Number:[phone] 10/18/2022, 10:43 PM   Clinical Narrative: No hx of drug/alcohol use, no resources indicated.    CAGE-AID Screening:    Have You Ever Felt You Ought to Cut Down on Your Drinking or Drug Use?: No Have People Annoyed You By Critizing Your Drinking Or Drug Use?: No Have You Felt Bad Or Guilty About Your Drinking Or Drug Use?: No Have You Ever Had a Drink or Used Drugs First Thing In The Morning to Steady Your Nerves or to Get Rid of a Hangover?: No CAGE-AID Score: 0  Substance Abuse Education Offered: No

## 2022-10-18 NOTE — Progress Notes (Signed)
MEDICATION-RELATED CONSULT NOTE   Ortho Procedure Consult - Anticoagulant/Antiplatelet PTA/Inpatient Med List Review by Pharmacist   Procedure: intramedullary nailing of L femur    Completed: 10/18/22  Antithrombotic medications on inpatient or PTA profile prior to procedure:   clopidogrel    Recommended restart time per ortho: POD1 AM   Plan:     Start clopidogrel on 1/5 AM  Dimple Nanas, PharmD, BCPS 10/18/2022 4:47 PM

## 2022-10-18 NOTE — Progress Notes (Signed)
Pt. Returned to unit alert and oriented X 4, no c/o pain. Family at bedside, bed in lowest position call bell within reach

## 2022-10-18 NOTE — Plan of Care (Signed)

## 2022-10-18 NOTE — Progress Notes (Signed)
Pt. Off unit to OR.

## 2022-10-19 ENCOUNTER — Inpatient Hospital Stay (HOSPITAL_COMMUNITY): Payer: Medicare Other

## 2022-10-19 ENCOUNTER — Encounter (HOSPITAL_COMMUNITY): Payer: Self-pay | Admitting: Orthopedic Surgery

## 2022-10-19 DIAGNOSIS — E1169 Type 2 diabetes mellitus with other specified complication: Secondary | ICD-10-CM | POA: Diagnosis not present

## 2022-10-19 DIAGNOSIS — E669 Obesity, unspecified: Secondary | ICD-10-CM | POA: Diagnosis not present

## 2022-10-19 DIAGNOSIS — E785 Hyperlipidemia, unspecified: Secondary | ICD-10-CM | POA: Diagnosis not present

## 2022-10-19 DIAGNOSIS — S72145A Nondisplaced intertrochanteric fracture of left femur, initial encounter for closed fracture: Secondary | ICD-10-CM | POA: Diagnosis not present

## 2022-10-19 LAB — BASIC METABOLIC PANEL
Anion gap: 10 (ref 5–15)
BUN: 31 mg/dL — ABNORMAL HIGH (ref 8–23)
CO2: 22 mmol/L (ref 22–32)
Calcium: 8.3 mg/dL — ABNORMAL LOW (ref 8.9–10.3)
Chloride: 101 mmol/L (ref 98–111)
Creatinine, Ser: 2.06 mg/dL — ABNORMAL HIGH (ref 0.44–1.00)
GFR, Estimated: 25 mL/min — ABNORMAL LOW (ref >60)
Glucose, Bld: 159 mg/dL — ABNORMAL HIGH (ref 70–99)
Potassium: 4 mmol/L (ref 3.5–5.1)
Sodium: 133 mmol/L — ABNORMAL LOW (ref 135–145)

## 2022-10-19 LAB — CBC
HCT: 28.6 % — ABNORMAL LOW (ref 36.0–46.0)
Hemoglobin: 9.5 g/dL — ABNORMAL LOW (ref 12.0–15.0)
MCH: 29.7 pg (ref 26.0–34.0)
MCHC: 33.2 g/dL (ref 30.0–36.0)
MCV: 89.4 fL (ref 80.0–100.0)
Platelets: 187 10*3/uL (ref 150–400)
RBC: 3.2 MIL/uL — ABNORMAL LOW (ref 3.87–5.11)
RDW: 13.2 % (ref 11.5–15.5)
WBC: 9.8 10*3/uL (ref 4.0–10.5)
nRBC: 0 % (ref 0.0–0.2)

## 2022-10-19 LAB — MAGNESIUM: Magnesium: 2.2 mg/dL (ref 1.7–2.4)

## 2022-10-19 LAB — VITAMIN D 25 HYDROXY (VIT D DEFICIENCY, FRACTURES): Vit D, 25-Hydroxy: 38.18 ng/mL (ref 30–100)

## 2022-10-19 NOTE — Anesthesia Postprocedure Evaluation (Signed)
Anesthesia Post Note  Patient: Samantha Mcbride  Procedure(s) Performed: INTRAMEDULLARY NAILING OF LEFT FEMUR (Left: Leg Upper)     Patient location during evaluation: PACU Anesthesia Type: General Level of consciousness: awake and alert Pain management: pain level controlled Vital Signs Assessment: post-procedure vital signs reviewed and stable Respiratory status: spontaneous breathing, nonlabored ventilation and respiratory function stable Cardiovascular status: blood pressure returned to baseline and stable Postop Assessment: no apparent nausea or vomiting Anesthetic complications: no   No notable events documented.  Last Vitals:  Vitals:   10/19/22 0028 10/19/22 0535  BP: 133/70 104/68  Pulse: 100 79  Resp: 19 17  Temp: 37.2 C 37.2 C  SpO2: 96% 96%    Last Pain:  Vitals:   10/19/22 0535  TempSrc: Oral  PainSc:                  [name]

## 2022-10-19 NOTE — Plan of Care (Signed)
  Problem: Education: Goal: Knowledge of General Education information will improve Description: Including pain rating scale, medication(s)/side effects and non-pharmacologic comfort measures Outcome: Progressing   Problem: Activity: Goal: Risk for activity intolerance will decrease Outcome: Progressing   Problem: Pain Managment: Goal: General experience of comfort will improve Outcome: Progressing   

## 2022-10-19 NOTE — Progress Notes (Signed)
PROGRESS NOTE  Samantha Mcbride MGQ:[id number] DOB: 24-Jul-1951 DOA: 10/17/2022 PCP: Antony Contras, MD   LOS: 1 day   Brief Narrative / Interim history: 72 year old female with history of CAD status post STEMI and DES in 2012, peri-infarct V. tach, COPD, CKD 4, RA, HLD, HTN, breast cancer in remission comes in after a ground-level fall.  Her daughter's dog ran into her and she fell backwards onto her left hip.  Imaging on admission showed left intertrochanteric femur fracture.  Orthopedics consulted, and she was admitted to the hospital  Subjective / 24h Interval events: Doing well this morning, underwent successful IM nail placement yesterday on the left.  No chest pain, no shortness of breath  Assesement and Plan: Principal Problem:   Intertrochanteric fracture of left hip (HCC) Active Problems:   Hyperlipidemia associated with type 2 diabetes mellitus (HCC)   Obesity (BMI 30-39.9)   H/O Anterior STEMI:  Proximal LAD  insertion of a 3.5x24 mm Promus element DES (08/2011)   CAD S/P percutaneous coronary angioplasty   Essential hypertension   Malignant neoplasm of upper-outer quadrant of right breast in female, estrogen receptor positive (HCC)   Chronic kidney disease, stage 4 (severe) (HCC)   Rheumatoid arthritis (HCC)   COPD (chronic obstructive pulmonary disease) with emphysema ([place]) noted on CT   Hip fracture (HCC)   Principal problem Left intertrochanteric hip fracture -appreciate orthopedic surgery follow-up, status post IM nail 10/18/2022 by Dr. Marcelino Scot.  Mobilize with PT today.  Suspect she will need some form of rehab  Active problems CKD 4 -baseline creatinine 1.8-2.1, currently close to the At 2.0, closely monitor  Hypokalemia-repleted, potassium 4.0 this morning  RA -continue hydroxychloroquine, leflunomide  CAD -no chest pain, this has been stable since her STEMI 11 years ago.  Seeing Dr. Ellyn Hack as an outpatient on a regular basis.  No red flag symptoms, no chest pains,  no dyspnea on exertion, no lower extremity swelling  HLD - continue statin, Zetia  HTN -continue carvedilol  History of breast cancer -outpatient follow-up  Scheduled Meds:  carvedilol  12.5 mg Oral BID   Chlorhexidine Gluconate Cloth  6 each Topical Q0600   clopidogrel  75 mg Oral Daily   docusate sodium  100 mg Oral BID   ezetimibe  10 mg Oral Daily   gabapentin  300 mg Oral QHS   hydroxychloroquine  200 mg Oral Daily   leflunomide  20 mg Oral Daily   melatonin  5 mg Oral QHS   potassium chloride  40 mEq Oral Once   rosuvastatin  20 mg Oral Daily   Continuous Infusions: PRN Meds:.HYDROcodone-acetaminophen, HYDROmorphone (DILAUDID) injection, menthol-cetylpyridinium **OR** phenol, metoCLOPramide **OR** metoCLOPramide (REGLAN) injection, ondansetron **OR** ondansetron (ZOFRAN) IV, traZODone  Current Outpatient Medications  Medication Instructions   acetaminophen (TYLENOL) 500 mg, Oral, Every 6 hours PRN   amoxicillin (AMOXIL) 500 mg, Oral, 2 times daily   Calcium Citrate-Vitamin D (CALCIUM + D PO) 1 tablet, Oral, Daily   carvedilol (COREG) 12.5 mg, Oral, 2 times daily   clopidogrel (PLAVIX) 75 MG tablet TAKE 1 TABLET BY MOUTH EVERY DAY   COVID-19 mRNA bivalent vaccine, Pfizer, (PFIZER COVID-19 VAC BIVALENT) injection Intramuscular   COVID-19 mRNA vaccine, Pfizer, 30 MCG/0.3ML injection Intramuscular   diclofenac sodium (VOLTAREN) 2 g, Topical, 4 times daily PRN   DM-APAP-CPM (CORICIDIN HBP PO) 1 tablet, Oral, Daily PRN   ezetimibe (ZETIA) 10 MG tablet TAKE 1 TABLET BY MOUTH EVERY DAY   gabapentin (NEURONTIN) 100 MG capsule TAKE 3  CAPSULES BY MOUTH AT BEDTIME.   hydroxychloroquine (PLAQUENIL) 200 mg, Oral, Daily   leflunomide (ARAVA) 20 mg, Oral, Daily   Melatonin 5 mg, Oral, Daily at bedtime   nitroGLYCERIN (NITROSTAT) 0.4 MG SL tablet PLACE 1 TABLET UNDER THE TONGUE EVERY 5 MINUTES AS NEEDED FOR CHEST PAIN.   Olopatadine HCl (PATADAY OP) 1 drop, Daily PRN   promethazine  (PHENERGAN) 25 MG suppository No dose, route, or frequency recorded.   rosuvastatin (CRESTOR) 20 MG tablet TAKE 1 TABLET BY MOUTH EVERY DAY   tiZANidine (ZANAFLEX) 2 mg, Oral, Daily at bedtime   traMADol (ULTRAM) 50 MG tablet Oral, Every 12 hours PRN   traZODone (DESYREL) 100 mg, Oral, Daily at bedtime    Diet Orders (From admission, onward)     Start     Ordered   10/18/22 1826  Diet regular Room service appropriate? Yes; Fluid consistency: Thin  Diet effective now       Question Answer Comment  Room service appropriate? Yes   Fluid consistency: Thin      10/18/22 1825            DVT prophylaxis: SCDs Start: 10/18/22 1826 SCDs Start: 10/17/22 1415   Lab Results  Component Value Date   PLT 187 10/19/2022      Code Status: Full Code  Family Communication: no family at bedside   Status is: Inpatient   Level of care: Med-Surg  Consultants:  Orthopedic surgery   Objective: Vitals:   10/18/22 1926 10/19/22 0028 10/19/22 0535 10/19/22 0833  BP: 115/75 133/70 104/68 (!) 142/72  Pulse: 100 100 79 84  Resp: '19 19 17 17  '$ Temp: 99 F (37.2 C) 99 F (37.2 C) 99 F (37.2 C) 98 F (36.7 C)  TempSrc: Oral Oral Oral Oral  SpO2: 97% 96% 96% 94%  Weight:      Height:        Intake/Output Summary (Last 24 hours) at 10/19/2022 1026 Last data filed at 10/19/2022 1000 Gross per 24 hour  Intake 700 ml  Output 1850 ml  Net -1150 ml   Wt Readings from Last 3 Encounters:  10/18/22 72.6 kg  07/24/22 73.9 kg  07/23/22 73.7 kg    Examination:  Constitutional: NAD Eyes: lids and conjunctivae normal, no scleral icterus ENMT: mmm Neck: normal, supple Respiratory: clear to auscultation bilaterally, no wheezing, no crackles. Normal respiratory effort.  Cardiovascular: Regular rate and rhythm, no murmurs / rubs / gallops. No LE edema. Abdomen: soft, no distention, no tenderness. Bowel sounds positive.    Data Reviewed: I have independently reviewed following labs and  imaging studies   CBC Recent Labs  Lab 10/17/22 1140 10/17/22 1148 10/18/22 0404 10/19/22 0526  WBC  --  10.4 8.6 9.8  HGB 12.2 11.9* 10.0* 9.5*  HCT 36.0 37.3 31.0* 28.6*  PLT  --  230 177 187  MCV  --  92.1 90.6 89.4  MCH  --  29.4 29.2 29.7  MCHC  --  31.9 32.3 33.2  RDW  --  13.3 13.2 13.2     Recent Labs  Lab 10/17/22 1140 10/17/22 1148 10/18/22 0404 10/19/22 0526  NA 139 135 139 133*  K 3.5 3.3* 3.5 4.0  CL 104 101 105 101  CO2  --  '25 24 22  '$ GLUCOSE 110* 117* 95 159*  BUN '19 18 18 '$ 31*  CREATININE 2.10* 1.90* 1.87* 2.06*  CALCIUM  --  9.5 8.9 8.3*  AST  --  19  --   --  ALT  --  15  --   --   ALKPHOS  --  66  --   --   BILITOT  --  0.3  --   --   ALBUMIN  --  3.6  --   --   MG  --  2.2  --  2.2  INR  --  1.0  --   --      ------------------------------------------------------------------------------------------------------------------ No results for input(s): "CHOL", "HDL", "LDLCALC", "TRIG", "CHOLHDL", "LDLDIRECT" in the last 72 hours.  Lab Results  Component Value Date   HGBA1C 5.6 08/07/2013   ------------------------------------------------------------------------------------------------------------------ No results for input(s): "TSH", "T4TOTAL", "T3FREE", "THYROIDAB" in the last 72 hours.  Invalid input(s): "FREET3"  Cardiac Enzymes No results for input(s): "CKMB", "TROPONINI", "MYOGLOBIN" in the last 168 hours.  Invalid input(s): "CK" ------------------------------------------------------------------------------------------------------------------ No results found for: "BNP"  CBG: No results for input(s): "GLUCAP" in the last 168 hours.  Recent Results (from the past 240 hour(s))  Surgical pcr screen     Status: None   Collection Time: 10/18/22  5:37 AM   Specimen: Nasal Mucosa; Nasal Swab  Result Value Ref Range Status   MRSA, PCR NEGATIVE NEGATIVE Final   Staphylococcus aureus NEGATIVE NEGATIVE Final    Comment: (NOTE) The  Xpert SA Assay (FDA approved for NASAL specimens in patients 31 years of age and older), is one component of a comprehensive surveillance program. It is not intended to diagnose infection nor to guide or monitor treatment. Performed at [facility], [place] [address] [place], [place] [address]      Radiology Studies: DG HIP UNILAT WITH PELVIS 2-3 VIEWS LEFT  Result Date: 10/18/2022 CLINICAL DATA:  Postop left hip fracture EXAM: DG HIP (WITH OR WITHOUT PELVIS) 2-3V LEFT COMPARISON:  10/17/2022 FINDINGS: Interval placement of dynamic hip screw. Expected alignment. The extreme distal aspect of the femoral shaft component is not included in the imaging field. Lateral soft tissue gas. Imaging was obtained to aid in treatment. There is some concentric joint space loss of the left hip with osteophyte formation. Osteopenia. Surgical clips overlie the central pelvis. Moderate colonic stool. IMPRESSION: Acute surgical changes with placement of a dynamic left hip screw and long femoral stem. The femoral component is not completely included in the imaging field. Electronically Signed   By: Jill Side M.D.   On: 10/18/2022 17:58   DG FEMUR MIN 2 VIEWS LEFT  Result Date: 10/18/2022 CLINICAL DATA:  Intertrochanteric fracture of left hip. Intraoperative fluoroscopy. EXAM: LEFT FEMUR 2 VIEWS COMPARISON:  Left femur radiographs 10/17/2022 FINDINGS: Images were performed intraoperatively without the presence of a radiologist. The patient is undergoing long cephalomedullary nail fixation of the previously seen fracture of the distal femoral neck and intertrochanteric region. No hardware complication is seen. Total fluoroscopy images: 9 Total fluoroscopy time: 118 seconds Total dose: Radiation Exposure Index (as provided by the fluoroscopic device): 26.84 mGy air Kerma Please see intraoperative findings for further detail. IMPRESSION: Intertrochanteric fracture of the left femur. Electronically Signed   By: Yvonne Kendall M.D.   On: 10/18/2022 17:21   DG C-Arm 1-60 Min-No Report  Result Date: 10/18/2022 Fluoroscopy was utilized by the requesting physician.  No radiographic interpretation.   DG C-Arm 1-60 Min-No Report  Result Date: 10/18/2022 Fluoroscopy was utilized by the requesting physician.  No radiographic interpretation.     Marzetta Board, MD, PhD [facility]  Between 7 am - 7 pm I am available, please contact me via Amion (for  emergencies) or Securechat (non urgent messages)  Between 7 pm - 7 am I am not available, please contact night coverage MD/APP via Amion

## 2022-10-19 NOTE — Progress Notes (Signed)
Inpatient Rehab Admissions Coordinator:   Per OT recommendations pt was screened for CIR by Shann Medal, PT, DPT.  [employer] Medicare unlikely to approve CIR for this diagnosis.  If pt/family wish to pursue and MD okay with increased LOS can place a consult order and work up for CIR.  Otherwise, will need SNF.    Shann Medal, PT, DPT Admissions Coordinator [phone] 10/19/22  10:36 AM

## 2022-10-19 NOTE — Evaluation (Addendum)
Occupational Therapy Evaluation Patient Details Name: Samantha Mcbride MRN: [id number] DOB: 08/11/1951 Today's Date: 10/19/2022   History of Present Illness Pt is a 72 yo F admitted 1/3 following a mechanical fall that resulted in a L intertrochanteric femur fx. She is now s/p IMN and WBAT LLE. PMH significant for HLD, CAD, HTN, breast cx, rheumatoid arthritis, and COPD   Clinical Impression   Pt presents to OT s/p IMN L femur with deficits in dynamic balance, generalized weakness, and post op pain that all limit her ability to perform ADLs and IADLs at PLOF. PTA pt was living alone and independent overall, some assist with IADLs from family. Pt required mod A overall today's session with a RW and max A with LB ADLs. Recommend CIR if family can provide assist at d/c. Pt would benefit from continued acute OT services to facilitate safe d/c home and optimize occupational performance.      Recommendations for follow up therapy are one component of a multi-disciplinary discharge planning process, led by the attending physician.  Recommendations may be updated based on patient status, additional functional criteria and insurance authorization.   Follow Up Recommendations  Acute inpatient rehab (3hours/day)     Assistance Recommended at Discharge Frequent or constant Supervision/Assistance  Patient can return home with the following A lot of help with bathing/dressing/bathroom;Assistance with cooking/housework;A lot of help with walking and/or transfers    Functional Status Assessment  Patient has had a recent decline in their functional status and demonstrates the ability to make significant improvements in function in a reasonable and predictable amount of time.  Equipment Recommendations  Other (comment) (TBD)    Recommendations for Other Services Rehab consult     Precautions / Restrictions Precautions Precautions: Fall Restrictions Weight Bearing Restrictions: Yes LLE Weight Bearing:  Weight bearing as tolerated  LUE- WBAT but should wear splint and not range      Mobility Bed Mobility Overal bed mobility: Needs Assistance Bed Mobility: Supine to Sit, Sit to Supine     Supine to sit: Mod assist Sit to supine: Mod assist        Transfers Overall transfer level: Needs assistance Equipment used: Rolling walker (2 wheels) Transfers: Sit to/from Stand, Bed to chair/wheelchair/BSC Sit to Stand: Mod assist Stand pivot transfers: Mod assist         General transfer comment: mod A with the RW, pain guarding preventing full WBAT on the LLE      Balance Overall balance assessment: Needs assistance Sitting-balance support: No upper extremity supported Sitting balance-Leahy Scale: Fair     Standing balance support: Bilateral upper extremity supported Standing balance-Leahy Scale: Poor                             ADL either performed or assessed with clinical judgement   ADL Overall ADL's : Needs assistance/impaired Eating/Feeding: Set up   Grooming: Supervision/safety   Upper Body Bathing: Supervision/ safety   Lower Body Bathing: Maximal assistance   Upper Body Dressing : Supervision/safety   Lower Body Dressing: Maximal assistance   Toilet Transfer: Moderate assistance;Rolling walker (2 wheels);Stand-pivot   Toileting- Clothing Manipulation and Hygiene: Maximal assistance;Sit to/from stand;Cueing for safety       Functional mobility during ADLs: Moderate assistance;Cueing for safety;Rolling walker (2 wheels);Cueing for sequencing       Vision Baseline Vision/History: 0 No visual deficits Ability to See in Adequate Light: 0 Adequate Patient Visual Report: No change  from baseline Vision Assessment?: No apparent visual deficits     Perception     Praxis      Pertinent Vitals/Pain Pain Assessment Pain Assessment: 0-10 Pain Score: 8  Pain Descriptors / Indicators: Aching Pain Intervention(s): RN gave pain meds during  session     Hand Dominance Right   Extremity/Trunk Assessment Upper Extremity Assessment Upper Extremity Assessment: Overall WFL for tasks assessed   Lower Extremity Assessment Lower Extremity Assessment: Defer to PT evaluation   Cervical / Trunk Assessment Cervical / Trunk Assessment: Kyphotic   Communication Communication Communication: No difficulties   Cognition Arousal/Alertness: Awake/alert Behavior During Therapy: WFL for tasks assessed/performed Overall Cognitive Status: Within Functional Limits for tasks assessed                                       General Comments  93/60 immediately upon sitting and rose to 102/64 with a seated rest break    Exercises     Shoulder Instructions      Home Living Family/patient expects to be discharged to:: Private residence Living Arrangements: Alone Available Help at Discharge: Available PRN/intermittently Type of Home: House (1 story townhouse) Home Access: Stairs to enter [employer] of Steps: 1 step with no rail   Home Layout: One level     Bathroom Shower/Tub: Walk-in shower;Tub/shower unit   Bathroom Toilet: Handicapped height Bathroom Accessibility: Yes How Accessible: Accessible via walker Home Equipment: Cane - single point          Prior Functioning/Environment Prior Level of Function : Independent/Modified Independent                        OT Problem List: Decreased strength;Decreased coordination;Decreased range of motion;Increased edema;Decreased activity tolerance;Impaired balance (sitting and/or standing);Decreased knowledge of use of DME or AE;Decreased knowledge of precautions      OT Treatment/Interventions: Self-care/ADL training;Therapeutic exercise;Therapeutic activities;Energy conservation;DME and/or AE instruction;Patient/family education;Balance training    OT Goals(Current goals can be found in the care plan section) Acute Rehab OT Goals Patient  Stated Goal: get better OT Goal Formulation: With patient Time For Goal Achievement: 11/02/22 Potential to Achieve Goals: Good  OT Frequency: Min 3X/week    Co-evaluation              AM-PAC OT "6 Clicks" Daily Activity     Outcome Measure Help from another person eating meals?: None Help from another person taking care of personal grooming?: None Help from another person toileting, which includes using toliet, bedpan, or urinal?: A Lot Help from another person bathing (including washing, rinsing, drying)?: A Lot Help from another person to put on and taking off regular upper body clothing?: A Little Help from another person to put on and taking off regular lower body clothing?: A Lot 6 Click Score: 17   End of Session Equipment Utilized During Treatment: Oxygen;Rolling walker (2 wheels);Gait belt Nurse Communication: Mobility status  Activity Tolerance: Patient tolerated treatment well Patient left: in chair;with call bell/phone within reach  OT Visit Diagnosis: Unsteadiness on feet (R26.81);Muscle weakness (generalized) (M62.81)                Time: 0867-6195 OT Time Calculation (min): 35 min Charges:  OT General Charges $OT Visit: 1 Visit OT Evaluation $OT Eval Moderate Complexity: 1 Mod OT Treatments $Self Care/Home Management : 8-22 mins  Laverle Hobby, OTR/L, CBIS Acute Rehab Office:  [phone]   Curtis Sites 10/19/2022, 9:39 AM

## 2022-10-19 NOTE — Evaluation (Addendum)
Physical Therapy Evaluation Patient Details Name: Samantha Mcbride MRN: [id number] DOB: 1951-01-16 Today's Date: 10/19/2022  History of Present Illness  Pt is a 72 yo F admitted 1/3 following a mechanical fall that resulted in a L intertrochanteric femur fx. She is now s/p IMN and WBAT LLE. PMH significant for HLD, CAD, HTN, breast cx, rheumatoid arthritis, and COPD  Clinical Impression  Pt seen for PT evaluation with pt agreeable to tx, family present in room. Pt reports prior to admission she was independent with all mobility & this fall is resultant from a dog knocking her over. Pt also notes she had "fluid drained from her left elbow" earlier in the day, prior to fall. Pt notes she was instructed to wear a splint on LUE & could remove it for showering. Due to this, maintained NWB LUE throughout session until ROM & weight bearing orders can be clarified. Pt does not have splint on LUE during session.  Pt transfers STS with min assist & tolerates standing ~1 minute with RUE support before c/o dizziness & returning to sitting; once sitting pt reports symptoms dissipate. Lowest SpO2 observed was 90% on 1L/min via nasal cannula. PT instructs pt in LLE strengthening exercises & recommendation of CIR upon d/c from acute setting & pt agreeable.        Recommendations for follow up therapy are one component of a multi-disciplinary discharge planning process, led by the attending physician.  Recommendations may be updated based on patient status, additional functional criteria and insurance authorization.  Follow Up Recommendations Acute inpatient rehab (3hours/day) Can patient physically be transported by private vehicle: No    Assistance Recommended at Discharge Frequent or constant Supervision/Assistance  Patient can return home with the following  A lot of help with walking and/or transfers;A lot of help with bathing/dressing/bathroom;Assist for transportation;Assistance with cooking/housework;Help  with stairs or ramp for entrance    Equipment Recommendations None recommended by PT  Recommendations for Other Services  Rehab consult    Functional Status Assessment Patient has had a recent decline in their functional status and demonstrates the ability to make significant improvements in function in a reasonable and predictable amount of time.     Precautions / Restrictions Precautions Precautions: Fall Precaution Comments: Pt should have LUE splint on, minimize ROM to allow bursitis to resolve (pt had elbow drained 10/17/22) Restrictions Weight Bearing Restrictions: Yes LUE Weight Bearing: NWB LLE Weight Bearing: Weight bearing as tolerated Per Dr. Annie Main (via secure chat), pt can weight bear through L wrist & forearm, but try to limit weight bearing through L elbow.       Mobility  Bed Mobility               General bed mobility comments: not tested, pt received & left sitting in recliner    Transfers Overall transfer level: Needs assistance Equipment used: 1 person hand held assist Transfers: Sit to/from Stand Sit to Stand: Min assist           General transfer comment: pt able to power up to standing with min assist from recliner, c/o increased pain in L hip with transitional movements    Ambulation/Gait                  Stairs            Wheelchair Mobility    Modified Rankin (Stroke Patients Only)       Balance Overall balance assessment: Needs assistance Sitting-balance support: No upper extremity supported  Sitting balance-Leahy Scale: Fair     Standing balance support: Single extremity supported, During functional activity Standing balance-Leahy Scale: Poor                               Pertinent Vitals/Pain Pain Assessment Pain Assessment: Faces Faces Pain Scale: Hurts whole lot Pain Location: LLE with movement Pain Descriptors / Indicators: Aching, Discomfort, Grimacing Pain Intervention(s): Monitored  during session, Repositioned, Limited activity within patient's tolerance    Home Living Family/patient expects to be discharged to:: Private residence Living Arrangements: Alone Available Help at Discharge: Available PRN/intermittently Type of Home: House (1 story townhouse) Home Access: Stairs to enter   [employer] of Steps: 1 step with no rail   Home Layout: One level Home Equipment: Cane - single point      Prior Function Prior Level of Function : Independent/Modified Independent                     Hand Dominance   Dominant Hand: Right    Extremity/Trunk Assessment   Upper Extremity Assessment Upper Extremity Assessment: Overall WFL for tasks assessed    Lower Extremity Assessment Lower Extremity Assessment: LLE deficits/detail LLE Deficits / Details: 2/5 knee extension in sitting    Cervical / Trunk Assessment Cervical / Trunk Assessment: Kyphotic  Communication   Communication: No difficulties  Cognition Arousal/Alertness: Awake/alert Behavior During Therapy: WFL for tasks assessed/performed Overall Cognitive Status: Within Functional Limits for tasks assessed                                          General Comments General comments (skin integrity, edema, etc.): 93/60 immediately upon sitting and rose to 102/64 with a seated rest break    Exercises General Exercises - Lower Extremity Short Arc Quad: AROM, Strengthening, Left, 10 reps Heel Slides: AAROM, Strengthening, Left, 15 reps Hip ABduction/ADduction: AAROM, Strengthening, Left, 15 reps Straight Leg Raises: AAROM, Strengthening, Left, 10 reps   Assessment/Plan    PT Assessment Patient needs continued PT services  PT Problem List Decreased strength;Cardiopulmonary status limiting activity;Pain;Decreased range of motion;Decreased activity tolerance;Decreased balance;Decreased mobility;Decreased knowledge of precautions;Decreased safety awareness;Decreased  knowledge of use of DME;Decreased skin integrity       PT Treatment Interventions DME instruction;Gait training;Balance training;Therapeutic exercise;Neuromuscular re-education;Stair training;Functional mobility training;Patient/family education;Therapeutic activities;Modalities    PT Goals (Current goals can be found in the Care Plan section)  Acute Rehab PT Goals Patient Stated Goal: decreased pain, get better PT Goal Formulation: With patient Time For Goal Achievement: 11/02/22 Potential to Achieve Goals: Good    Frequency Min 5X/week     Co-evaluation               AM-PAC PT "6 Clicks" Mobility  Outcome Measure Help needed turning from your back to your side while in a flat bed without using bedrails?: A Little Help needed moving from lying on your back to sitting on the side of a flat bed without using bedrails?: A Lot Help needed moving to and from a bed to a chair (including a wheelchair)?: A Lot Help needed standing up from a chair using your arms (e.g., wheelchair or bedside chair)?: A Little Help needed to walk in hospital room?: Total Help needed climbing 3-5 steps with a railing? : Total 6 Click Score: 12  End of Session   Activity Tolerance: Patient tolerated treatment well;Patient limited by fatigue;Treatment limited secondary to medical complications (Comment) Patient left: in chair;with call bell/phone within reach;with family/visitor present Nurse Communication: Mobility status PT Visit Diagnosis: Muscle weakness (generalized) (M62.81);Pain;Difficulty in walking, not elsewhere classified (R26.2);Unsteadiness on feet (R26.81) Pain - Right/Left: Left Pain - part of body: Hip    Time: 9528-4132 PT Time Calculation (min) (ACUTE ONLY): 18 min   Charges:   PT Evaluation $PT Eval Low Complexity: 1 Low PT Treatments $Therapeutic Exercise: 8-22 mins        Lavone Nian, PT, DPT 10/19/22, 12:11 PM   Waunita Schooner 10/19/2022, 11:21 AM

## 2022-10-19 NOTE — Discharge Instructions (Addendum)
Orthopaedic Trauma Service Discharge Instructions   General Discharge Instructions  Orthopaedic Injuries:  Left hip fracture treated with intramedullary nailing  WEIGHT BEARING STATUS: Weight-bear as tolerated with walker  RANGE OF MOTION/ACTIVITY: Slowly increase activity.  Range of motion as tolerated left hip and knee.  No range of motion restrictions  Bone health: Vitamin D levels look good however fracture mechanism and location are suggestive of osteoporosis.  Recommend follow-up with primary care doctor osteoporosis clinic for further evaluation  Review the following resource for additional information regarding bone health  [email or web address]  Wound Care: Daily wound care starting on 10/21/2022.  Once there is no drainage wounds can remain open to the air.  Otherwise cover with Mepilex or silicone foam dressing alternatively you can use 4 x 4 gauze and tape  Diet: as you were eating previously.  Can use over the counter stool softeners and bowel preparations, such as Miralax, to help with bowel movements.  Narcotics can be constipating.  Be sure to drink plenty of fluids  PAIN MEDICATION USE AND EXPECTATIONS  You have likely been given narcotic medications to help control your pain.  After a traumatic event that results in an fracture (broken bone) with or without surgery, it is ok to use narcotic pain medications to help control one's pain.  We understand that everyone responds to pain differently and each individual patient will be evaluated on a regular basis for the continued need for narcotic medications. Ideally, narcotic medication use should last no more than 6-8 weeks (coinciding with fracture healing).   As a patient it is your responsibility as well to monitor narcotic medication use and report the amount and frequency you use these medications when you come to your office visit.   We would also advise that if you are using narcotic medications,  you should take a dose prior to therapy to maximize you participation.  IF YOU ARE ON NARCOTIC MEDICATIONS IT IS NOT PERMISSIBLE TO OPERATE A MOTOR VEHICLE (MOTORCYCLE/CAR/TRUCK/MOPED) OR HEAVY MACHINERY DO NOT MIX NARCOTICS WITH OTHER CNS (CENTRAL NERVOUS SYSTEM) DEPRESSANTS SUCH AS ALCOHOL  Take less than 4gram of tylenol/day  POST-OPERATIVE OPIOID TAPER INSTRUCTIONS: It is important to wean off of your opioid medication as soon as possible. If you do not need pain medication after your surgery it is ok to stop day one. Opioids include: Codeine, Hydrocodone(Norco, Vicodin), Oxycodone(Percocet, oxycontin) and hydromorphone amongst others.  Long term and even short term use of opiods can cause: Increased pain response Dependence Constipation Depression Respiratory depression And more.  Withdrawal symptoms can include Flu like symptoms Nausea, vomiting And more Techniques to manage these symptoms Hydrate well Eat regular healthy meals Stay active Use relaxation techniques(deep breathing, meditating, yoga) Do Not substitute Alcohol to help with tapering If you have been on opioids for less than two weeks and do not have pain than it is ok to stop all together.  Plan to wean off of opioids This plan should start within one week post op of your fracture surgery  Maintain the same interval or time between taking each dose and first decrease the dose.  Cut the total daily intake of opioids by one tablet each day Next start to increase the time between doses. The last dose that should be eliminated is the evening dose.    STOP SMOKING OR USING NICOTINE PRODUCTS!!!!  As discussed nicotine severely impairs your body's ability to heal surgical and traumatic wounds but also impairs bone healing.  Wounds and bone heal  by forming microscopic blood vessels (angiogenesis) and nicotine is a vasoconstrictor (essentially, shrinks blood vessels).  Therefore, if vasoconstriction occurs to these  microscopic blood vessels they essentially disappear and are unable to deliver necessary nutrients to the healing tissue.  This is one modifiable factor that you can do to dramatically increase your chances of healing your injury.    (This means no smoking, no nicotine gum, patches, etc)  DO NOT USE NONSTEROIDAL ANTI-INFLAMMATORY DRUGS (NSAID'S)  Using products such as Advil (ibuprofen), Aleve (naproxen), Motrin (ibuprofen) for additional pain control during fracture healing can delay and/or prevent the healing response.  If you would like to take over the counter (OTC) medication, Tylenol (acetaminophen) is ok.  However, some narcotic medications that are given for pain control contain acetaminophen as well. Therefore, you should not exceed more than 4000 mg of tylenol in a day if you do not have liver disease.  Also note that there are may OTC medicines, such as cold medicines and allergy medicines that my contain tylenol as well.  If you have any questions about medications and/or interactions please ask your doctor/PA or your pharmacist.      ICE AND ELEVATE INJURED/OPERATIVE EXTREMITY  Using ice and elevating the injured extremity above your heart can help with swelling and pain control.  Icing in a pulsatile fashion, such as 20 minutes on and 20 minutes off, can be followed.    Do not place ice directly on skin. Make sure there is a barrier between to skin and the ice pack.    Using frozen items such as frozen peas works well as the conform nicely to the are that needs to be iced.  USE AN ACE WRAP OR TED HOSE FOR SWELLING CONTROL  In addition to icing and elevation, Ace wraps or TED hose are used to help limit and resolve swelling.  It is recommended to use Ace wraps or TED hose until you are informed to stop.    When using Ace Wraps start the wrapping distally (farthest away from the body) and wrap proximally (closer to the body)   Example: If you had surgery on your leg or thing and you do not  have a splint on, start the ace wrap at the toes and work your way up to the thigh        If you had surgery on your upper extremity and do not have a splint on, start the ace wrap at your fingers and work your way up to the upper arm  IF YOU ARE IN A SPLINT OR CAST DO NOT Melfa   If your splint gets wet for any reason please contact the office immediately. You may shower in your splint or cast as long as you keep it dry.  This can be done by wrapping in a cast cover or garbage back (or similar)  Do Not stick any thing down your splint or cast such as pencils, money, or hangers to try and scratch yourself with.  If you feel itchy take benadryl as prescribed on the bottle for itching  IF YOU ARE IN A CAM BOOT (BLACK BOOT)  You may remove boot periodically. Perform daily dressing changes as noted below.  Wash the liner of the boot regularly and wear a sock when wearing the boot. It is recommended that you sleep in the boot until told otherwise    Call office for the following: Temperature greater than 101F Persistent nausea and vomiting Severe  uncontrolled pain Redness, tenderness, or signs of infection (pain, swelling, redness, odor or green/yellow discharge around the site) Difficulty breathing, headache or visual disturbances Hives Persistent dizziness or light-headedness Extreme fatigue Any other questions or concerns you may have after discharge  In an emergency, call 911 or go to an Emergency Department at a nearby hospital  HELPFUL INFORMATION  If you had a block, it will wear off between 8-24 hrs postop typically.  This is period when your pain may go from nearly zero to the pain you would have had postop without the block.  This is an abrupt transition but nothing dangerous is happening.  You may take an extra dose of narcotic when this happens.  You should wean off your narcotic medicines as soon as you are able.  Most patients will be off or using minimal  narcotics before their first postop appointment.   We suggest you use the pain medication the first night prior to going to bed, in order to ease any pain when the anesthesia wears off. You should avoid taking pain medications on an empty stomach as it will make you nauseous.  Do not drink alcoholic beverages or take illicit drugs when taking pain medications.  In most states it is against the law to drive while you are in a splint or sling.  And certainly against the law to drive while taking narcotics.  You may return to work/school in the next couple of days when you feel up to it.   Pain medication may make you constipated.  Below are a few solutions to try in this order: Decrease the amount of pain medication if you aren't having pain. Drink lots of decaffeinated fluids. Drink prune juice and/or each dried prunes  If the first 3 don't work start with additional solutions Take Colace - an over-the-counter stool softener Take Senokot - an over-the-counter laxative Take Miralax - a stronger over-the-counter laxative     CALL THE OFFICE WITH ANY QUESTIONS OR CONCERNS: [phone]   VISIT OUR WEBSITE FOR ADDITIONAL INFORMATION: [email or web address]

## 2022-10-19 NOTE — Progress Notes (Addendum)
Orthopaedic Trauma Service Progress Note  Patient ID: Samantha Mcbride MRN: [id number] DOB/AGE: [birth year] 72 y.o.  Subjective:  Doing okay Left hip is sore Some pain with transfer  Was using a cane prior to her fall for bulging disc in her back    ROS As above  Objective:   VITALS:   Vitals:   10/18/22 1926 10/19/22 0028 10/19/22 0535 10/19/22 0833  BP: 115/75 133/70 104/68 (!) 142/72  Pulse: 100 100 79 84  Resp: '19 19 17 17  '$ Temp: 99 F (37.2 C) 99 F (37.2 C) 99 F (37.2 C) 98 F (36.7 C)  TempSrc: Oral Oral Oral Oral  SpO2: 97% 96% 96% 94%  Weight:      Height:        Estimated body mass index is 30.23 kg/m as calculated from the following:   Height as of this encounter: '5\' 1"'$  (1.549 m).   Weight as of this encounter: 72.6 kg.   Intake/Output      01/04 0701 01/05 0700 01/05 0701 01/06 0700   P.O. 0    I.V. (mL/kg) 700 (9.6)    Total Intake(mL/kg) 700 (9.6)    Urine (mL/kg/hr) 1450 (0.8) 300 (1.1)   Blood 100    Total Output 1550 300   Net -850 -300          LABS  Results for orders placed or performed during the hospital encounter of 10/17/22 (from the past 24 hour(s))  Basic metabolic panel     Status: Abnormal   Collection Time: 10/19/22  5:26 AM  Result Value Ref Range   Sodium 133 (L) 135 - 145 mmol/L   Potassium 4.0 3.5 - 5.1 mmol/L   Chloride 101 98 - 111 mmol/L   CO2 22 22 - 32 mmol/L   Glucose, Bld 159 (H) 70 - 99 mg/dL   BUN 31 (H) 8 - 23 mg/dL   Creatinine, Ser 2.06 (H) 0.44 - 1.00 mg/dL   Calcium 8.3 (L) 8.9 - 10.3 mg/dL   GFR, Estimated 25 (L) >60 mL/min   Anion gap 10 5 - 15  CBC     Status: Abnormal   Collection Time: 10/19/22  5:26 AM  Result Value Ref Range   WBC 9.8 4.0 - 10.5 K/uL   RBC 3.20 (L) 3.87 - 5.11 MIL/uL   Hemoglobin 9.5 (L) 12.0 - 15.0 g/dL   HCT 28.6 (L) 36.0 - 46.0 %   MCV 89.4 80.0 - 100.0 fL   MCH 29.7 26.0 - 34.0 pg   MCHC  33.2 30.0 - 36.0 g/dL   RDW 13.2 11.5 - 15.5 %   Platelets 187 150 - 400 K/uL   nRBC 0.0 0.0 - 0.2 %  Magnesium     Status: None   Collection Time: 10/19/22  5:26 AM  Result Value Ref Range   Magnesium 2.2 1.7 - 2.4 mg/dL  VITAMIN D 25 Hydroxy (Vit-D Deficiency, Fractures)     Status: None   Collection Time: 10/19/22  5:26 AM  Result Value Ref Range   Vit D, 25-Hydroxy 38.18 30 - 100 ng/mL     PHYSICAL EXAM:   Gen: sitting up in chair, NAD, appears well Lungs: unlabored  Ext:       Left Lower Extremity   Dressings are stable left hip  and thigh  Extremity is warm  No swelling of significance  No DCT   Compartments are soft  + DP pulse   No DCT   Distal motor and sensory function intact   Assessment/Plan: 1 Day Post-Op    Anti-infectives (From admission, onward)    Start     Dose/Rate Route Frequency Ordered Stop   10/19/22 0600  ceFAZolin (ANCEF) IVPB 2g/100 mL premix        2 g 200 mL/hr over 30 Minutes Intravenous On call to O.R. 10/18/22 1257 10/18/22 1527   10/19/22 0300  ceFAZolin (ANCEF) IVPB 2g/100 mL premix        2 g 200 mL/hr over 30 Minutes Intravenous Every 12 hours 10/18/22 1825 10/19/22 0517   10/18/22 1000  hydroxychloroquine (PLAQUENIL) tablet 200 mg        200 mg Oral Daily 10/17/22 1553       .  POD/HD#: 1  72 y/o female s/p fall with L hip fracture   -Left intertrochanteric hip fracture s/p IMN  Weightbearing Weight-bear as tolerated with assistance using walker   ROM/Activity   Unrestricted range of motion left hip and knee.  Activity as tolerated   Wound care   Dressing changes starting on 10/21/2022   Okay to shower and clean wounds with soap and water   PT and OT evaluations  Ice and elevation for swelling and pain control  - Pain management:  Multimodal  - ABL anemia/Hemodynamics  Stable  Monitor  - Medical issues   Per primary - DVT/PE prophylaxis:  Patient on Plavix prior to admission.  Continue plavix   scds  -  ID:   Perioperative antibiotics  - Metabolic Bone Disease:  Vitamin D levels look good however fracture is a fragility type fracture suggestive of osteoporosis  Recommend outpatient follow-up with osteoporosis clinic  - Activity:  As above  - Impediments to fracture healing:  Poor bone quality/fragility fracture  - Dispo:  Therapy evaluations   Ideally daily therapy  Ortho issues stable  Follow-up in 2 weeks for suture removal and x-rays     Jari Pigg, PA-C [phone] (C) 10/19/2022, 10:41 AM  [facility] [place] [address] [phone] Jenetta [phone] (F)    After 5pm and on the weekends please log on to Amion, go to orthopaedics and the look under the [facility] Call for the provider(s) on call. You can also call our office at [phone] and then follow the prompts to be connected to the call team.  Patient ID: Samantha Mcbride, female   DOB: 1951-04-02, 72 y.o.   MRN: [id number]

## 2022-10-19 NOTE — Progress Notes (Signed)
Inpatient Rehab Admissions Coordinator:    I spoke with pt. Regarding potential CIR admit. She states interest and daughter can provide 24/7 assist. They are aware that insurance will likely require an appeal process and that this could increase pt.'s length of stay. I will follow for potential CIR admit pending insurance auth.   Clemens Catholic, [place], [place] Admissions Coordinator  [phone] ([place]) [phone] (office)

## 2022-10-20 DIAGNOSIS — S72145A Nondisplaced intertrochanteric fracture of left femur, initial encounter for closed fracture: Secondary | ICD-10-CM | POA: Diagnosis not present

## 2022-10-20 DIAGNOSIS — E785 Hyperlipidemia, unspecified: Secondary | ICD-10-CM | POA: Diagnosis not present

## 2022-10-20 DIAGNOSIS — E1169 Type 2 diabetes mellitus with other specified complication: Secondary | ICD-10-CM | POA: Diagnosis not present

## 2022-10-20 DIAGNOSIS — E669 Obesity, unspecified: Secondary | ICD-10-CM | POA: Diagnosis not present

## 2022-10-20 LAB — BASIC METABOLIC PANEL
Anion gap: 13 (ref 5–15)
BUN: 39 mg/dL — ABNORMAL HIGH (ref 8–23)
CO2: 16 mmol/L — ABNORMAL LOW (ref 22–32)
Calcium: 8.3 mg/dL — ABNORMAL LOW (ref 8.9–10.3)
Chloride: 109 mmol/L (ref 98–111)
Creatinine, Ser: 2.26 mg/dL — ABNORMAL HIGH (ref 0.44–1.00)
GFR, Estimated: 23 mL/min — ABNORMAL LOW (ref >60)
Glucose, Bld: 124 mg/dL — ABNORMAL HIGH (ref 70–99)
Potassium: 4.3 mmol/L (ref 3.5–5.1)
Sodium: 138 mmol/L (ref 135–145)

## 2022-10-20 LAB — CBC
HCT: 26.3 % — ABNORMAL LOW (ref 36.0–46.0)
Hemoglobin: 8.5 g/dL — ABNORMAL LOW (ref 12.0–15.0)
MCH: 29.3 pg (ref 26.0–34.0)
MCHC: 32.3 g/dL (ref 30.0–36.0)
MCV: 90.7 fL (ref 80.0–100.0)
Platelets: 169 10*3/uL (ref 150–400)
RBC: 2.9 MIL/uL — ABNORMAL LOW (ref 3.87–5.11)
RDW: 13.2 % (ref 11.5–15.5)
WBC: 11.9 10*3/uL — ABNORMAL HIGH (ref 4.0–10.5)
nRBC: 0 % (ref 0.0–0.2)

## 2022-10-20 LAB — MAGNESIUM: Magnesium: UNDETERMINED mg/dL (ref 1.7–2.4)

## 2022-10-20 MED ORDER — SODIUM CHLORIDE 0.9 % IV BOLUS: 500.0000 mL | Freq: Once | INTRAVENOUS | Status: DC

## 2022-10-20 MED ORDER — HYDROCODONE-ACETAMINOPHEN 5-325 MG PO TABS
1.0000 | ORAL_TABLET | ORAL | Status: DC | PRN
  Administered 2022-10-20 – 2022-10-27 (×29): 2 via ORAL
  Filled 2022-10-20 (×30): qty 2

## 2022-10-20 NOTE — Plan of Care (Signed)

## 2022-10-20 NOTE — Progress Notes (Addendum)
Orthopaedic Trauma Service Progress Note  Patient ID: Samantha Mcbride MRN: [id number] DOB/AGE: 12-05-1950 72 y.o.  Subjective: Doing okay Transferred to recliner with assistance yesterday Order placed to DC foley  Was using a cane prior to her fall for bulging disc in her back    ROS As above  Objective:   VITALS:   Vitals:   10/19/22 0535 10/19/22 0833 10/19/22 1434 10/19/22 2110  BP: 104/68 (!) 142/72 119/70 125/66  Pulse: 79 84 86 79  Resp: '17 17 15 15  '$ Temp: 99 F (37.2 C) 98 F (36.7 C) 98.3 F (36.8 C) 97.9 F (36.6 C)  TempSrc: Oral Oral Oral Oral  SpO2: 96% 94% 95% 94%  Weight:      Height:        Estimated body mass index is 30.23 kg/m as calculated from the following:   Height as of this encounter: '5\' 1"'$  (1.549 m).   Weight as of this encounter: 72.6 kg.   Intake/Output      01/05 0701 01/06 0700 01/06 0701 01/07 0700   P.O.     I.V. (mL/kg)     Total Intake(mL/kg)     Urine (mL/kg/hr) 300 (0.2) 650 (18.5)   Blood     Total Output 300 650   Net -300 -650          LABS  Results for orders placed or performed during the hospital encounter of 10/17/22 (from the past 24 hour(s))  Basic metabolic panel     Status: Abnormal   Collection Time: 10/20/22  4:12 AM  Result Value Ref Range   Sodium 138 135 - 145 mmol/L   Potassium 4.3 3.5 - 5.1 mmol/L   Chloride 109 98 - 111 mmol/L   CO2 16 (L) 22 - 32 mmol/L   Glucose, Bld 124 (H) 70 - 99 mg/dL   BUN 39 (H) 8 - 23 mg/dL   Creatinine, Ser 2.26 (H) 0.44 - 1.00 mg/dL   Calcium 8.3 (L) 8.9 - 10.3 mg/dL   GFR, Estimated 23 (L) >60 mL/min   Anion gap 13 5 - 15  CBC     Status: Abnormal   Collection Time: 10/20/22  4:12 AM  Result Value Ref Range   WBC 11.9 (H) 4.0 - 10.5 K/uL   RBC 2.90 (L) 3.87 - 5.11 MIL/uL   Hemoglobin 8.5 (L) 12.0 - 15.0 g/dL   HCT 26.3 (L) 36.0 - 46.0 %   MCV 90.7 80.0 - 100.0 fL   MCH 29.3 26.0 -  34.0 pg   MCHC 32.3 30.0 - 36.0 g/dL   RDW 13.2 11.5 - 15.5 %   Platelets 169 150 - 400 K/uL   nRBC 0.0 0.0 - 0.2 %  Magnesium     Status: None   Collection Time: 10/20/22  4:12 AM  Result Value Ref Range   Magnesium QUANTITY NOT SUFFICIENT, UNABLE TO PERFORM TEST 1.7 - 2.4 mg/dL     PHYSICAL EXAM:   Gen: sitting up in chair, NAD, appears well Lungs: unlabored  Ext:       Left Lower Extremity   Dressings are stable left hip and thigh  Extremity is warm  No swelling of significance  No DCT   Compartments are soft  + DP pulse   No DCT   Distal  motor and sensory function intact   Assessment/Plan: 2 Days Post-Op    Anti-infectives (From admission, onward)    Start     Dose/Rate Route Frequency Ordered Stop   10/19/22 0600  ceFAZolin (ANCEF) IVPB 2g/100 mL premix        2 g 200 mL/hr over 30 Minutes Intravenous On call to O.R. 10/18/22 1257 10/18/22 1527   10/19/22 0300  ceFAZolin (ANCEF) IVPB 2g/100 mL premix        2 g 200 mL/hr over 30 Minutes Intravenous Every 12 hours 10/18/22 1825 10/19/22 0517   10/18/22 1000  hydroxychloroquine (PLAQUENIL) tablet 200 mg        200 mg Oral Daily 10/17/22 1553       .  POD/HD#: 1  72 y/o female s/p fall with L hip fracture   -Left intertrochanteric hip fracture s/p IMN  Weightbearing Weight-bear as tolerated with assistance using walker   ROM/Activity   Unrestricted range of motion left hip and knee.  Activity as tolerated   Wound care   Dressing changes starting on 10/21/2022   Okay to shower and clean wounds with soap and water   PT and OT evaluations  Ice and elevation for swelling and pain control  - Pain management:  Multimodal  - ABL anemia/Hemodynamics  Stable  Monitor  - Medical issues   Per primary - DVT/PE prophylaxis:  Patient on Plavix prior to admission.  Continue plavix   scds  - ID:   Perioperative antibiotics  - Metabolic Bone Disease:  Vitamin D levels look good however fracture is a  fragility type fracture suggestive of osteoporosis  Recommend outpatient follow-up with osteoporosis clinic  - Activity:  As above  - Impediments to fracture healing:  Poor bone quality/fragility fracture  - Dispo:  Therapy evaluations   Ideally daily therapy  Ortho issues stable  Follow-up in 2 weeks for suture removal and x-rays    Charlies Constable, MD Orthopaedic Surgery  10/20/2022, 7:30 AM  [facility] [place] [address] [phone] Jenetta [phone] (F)    After 5pm and on the weekends please log on to Amion, go to orthopaedics and the look under the [facility] Call for the provider(s) on call. You can also call our office at [phone] and then follow the prompts to be connected to the call team.  Patient ID: Samantha Mcbride, female   DOB: November 11, 1950, 72 y.o.   MRN: [id number]

## 2022-10-20 NOTE — Plan of Care (Signed)
  Problem: Education: Goal: Knowledge of General Education information will improve Description: Including pain rating scale, medication(s)/side effects and non-pharmacologic comfort measures Outcome: Progressing   Problem: Health Behavior/Discharge Planning: Goal: Ability to manage health-related needs will improve Outcome: Progressing   Problem: Activity: Goal: Risk for activity intolerance will decrease Outcome: Progressing   

## 2022-10-20 NOTE — Progress Notes (Signed)
PROGRESS NOTE  Samantha Mcbride KDT:[id number] DOB: 1951/09/30 DOA: 10/17/2022 PCP: Antony Contras, MD   LOS: 2 days   Brief Narrative / Interim history: 72 year old female with history of CAD status post STEMI and DES in 2012, peri-infarct V. tach, COPD, CKD 4, RA, HLD, HTN, breast cancer in remission comes in after a ground-level fall.  Her daughter's dog ran into her and she fell backwards onto her left hip.  Imaging on admission showed left intertrochanteric femur fracture.  Orthopedics consulted, and she was admitted to the hospital  Subjective / 24h Interval events: She is doing well this morning.  PT recommends CIR.  No chest pain, no shortness of breath.  Assesement and Plan: Principal Problem:   Intertrochanteric fracture of left hip (HCC) Active Problems:   Hyperlipidemia associated with type 2 diabetes mellitus (HCC)   Obesity (BMI 30-39.9)   H/O Anterior STEMI:  Proximal LAD  insertion of a 3.5x24 mm Promus element DES (08/2011)   CAD S/P percutaneous coronary angioplasty   Essential hypertension   Malignant neoplasm of upper-outer quadrant of right breast in female, estrogen receptor positive (HCC)   Chronic kidney disease, stage 4 (severe) (HCC)   Rheumatoid arthritis (HCC)   COPD (chronic obstructive pulmonary disease) with emphysema ([place]) noted on CT   Hip fracture (HCC)   Principal problem Left intertrochanteric hip fracture -appreciate orthopedic surgery follow-up, status post IM nail 10/18/2022 by Dr. Marcelino Scot.  Mobilize with PT today, need CIR, admission pending insurance authorization  Active problems CKD 4 -baseline creatinine 1.8-2.1, currently close to baseline but up to 2.2, likely dry, give a small bolus and recheck in the morning  Hypokalemia-repleted, potassium normalized this morning  RA -continue hydroxychloroquine, leflunomide  CAD -no chest pain, this has been stable since her STEMI 11 years ago.  Seeing Dr. Ellyn Hack as an outpatient on a regular basis.  No  red flag symptoms, no chest pains, no dyspnea on exertion, no lower extremity swelling  HLD - continue statin, Zetia  HTN -continue carvedilol  History of breast cancer -outpatient follow-up  Scheduled Meds:  carvedilol  12.5 mg Oral BID   Chlorhexidine Gluconate Cloth  6 each Topical Q0600   clopidogrel  75 mg Oral Daily   docusate sodium  100 mg Oral BID   ezetimibe  10 mg Oral Daily   gabapentin  300 mg Oral QHS   hydroxychloroquine  200 mg Oral Daily   leflunomide  20 mg Oral Daily   melatonin  5 mg Oral QHS   potassium chloride  40 mEq Oral Once   rosuvastatin  20 mg Oral Daily   Continuous Infusions:  sodium chloride     PRN Meds:.HYDROcodone-acetaminophen, HYDROmorphone (DILAUDID) injection, menthol-cetylpyridinium **OR** phenol, metoCLOPramide **OR** metoCLOPramide (REGLAN) injection, ondansetron **OR** ondansetron (ZOFRAN) IV, traZODone  Current Outpatient Medications  Medication Instructions   acetaminophen (TYLENOL) 500 mg, Oral, Every 6 hours PRN   amoxicillin (AMOXIL) 500 mg, Oral, 2 times daily   Calcium Citrate-Vitamin D (CALCIUM + D PO) 1 tablet, Oral, Daily   carvedilol (COREG) 12.5 mg, Oral, 2 times daily   clopidogrel (PLAVIX) 75 MG tablet TAKE 1 TABLET BY MOUTH EVERY DAY   COVID-19 mRNA bivalent vaccine, Pfizer, (PFIZER COVID-19 VAC BIVALENT) injection Intramuscular   COVID-19 mRNA vaccine, Pfizer, 30 MCG/0.3ML injection Intramuscular   diclofenac sodium (VOLTAREN) 2 g, Topical, 4 times daily PRN   DM-APAP-CPM (CORICIDIN HBP PO) 1 tablet, Oral, Daily PRN   ezetimibe (ZETIA) 10 MG tablet TAKE 1 TABLET BY  MOUTH EVERY DAY   gabapentin (NEURONTIN) 100 MG capsule TAKE 3 CAPSULES BY MOUTH AT BEDTIME.   hydroxychloroquine (PLAQUENIL) 200 mg, Oral, Daily   leflunomide (ARAVA) 20 mg, Oral, Daily   Melatonin 5 mg, Oral, Daily at bedtime   nitroGLYCERIN (NITROSTAT) 0.4 MG SL tablet PLACE 1 TABLET UNDER THE TONGUE EVERY 5 MINUTES AS NEEDED FOR CHEST PAIN.    Olopatadine HCl (PATADAY OP) 1 drop, Daily PRN   promethazine (PHENERGAN) 25 MG suppository No dose, route, or frequency recorded.   rosuvastatin (CRESTOR) 20 MG tablet TAKE 1 TABLET BY MOUTH EVERY DAY   tiZANidine (ZANAFLEX) 2 mg, Oral, Daily at bedtime   traMADol (ULTRAM) 50 MG tablet Oral, Every 12 hours PRN   traZODone (DESYREL) 100 mg, Oral, Daily at bedtime    Diet Orders (From admission, onward)     Start     Ordered   10/18/22 1826  Diet regular Room service appropriate? Yes; Fluid consistency: Thin  Diet effective now       Question Answer Comment  Room service appropriate? Yes   Fluid consistency: Thin      10/18/22 1825            DVT prophylaxis: SCDs Start: 10/18/22 1826 SCDs Start: 10/17/22 1415   Lab Results  Component Value Date   PLT 169 10/20/2022      Code Status: Full Code  Family Communication: no family at bedside   Status is: Inpatient   Level of care: Med-Surg  Consultants:  Orthopedic surgery   Objective: Vitals:   10/19/22 0535 10/19/22 0833 10/19/22 1434 10/19/22 2110  BP: 104/68 (!) 142/72 119/70 125/66  Pulse: 79 84 86 79  Resp: '17 17 15 15  '$ Temp: 99 F (37.2 C) 98 F (36.7 C) 98.3 F (36.8 C) 97.9 F (36.6 C)  TempSrc: Oral Oral Oral Oral  SpO2: 96% 94% 95% 94%  Weight:      Height:        Intake/Output Summary (Last 24 hours) at 10/20/2022 9323 Last data filed at 10/20/2022 5573 Gross per 24 hour  Intake --  Output 950 ml  Net -950 ml    Wt Readings from Last 3 Encounters:  10/18/22 72.6 kg  07/24/22 73.9 kg  07/23/22 73.7 kg    Examination:  Constitutional: NAD Eyes: lids and conjunctivae normal, no scleral icterus ENMT: mmm Neck: normal, supple Respiratory: clear to auscultation bilaterally, no wheezing, no crackles. Normal respiratory effort.  Cardiovascular: Regular rate and rhythm, no murmurs / rubs / gallops. No LE edema. Abdomen: soft, no distention, no tenderness. Bowel sounds positive.  Skin: no  rashes   Data Reviewed: I have independently reviewed following labs and imaging studies   CBC Recent Labs  Lab 10/17/22 1140 10/17/22 1148 10/18/22 0404 10/19/22 0526 10/20/22 0412  WBC  --  10.4 8.6 9.8 11.9*  HGB 12.2 11.9* 10.0* 9.5* 8.5*  HCT 36.0 37.3 31.0* 28.6* 26.3*  PLT  --  230 177 187 169  MCV  --  92.1 90.6 89.4 90.7  MCH  --  29.4 29.2 29.7 29.3  MCHC  --  31.9 32.3 33.2 32.3  RDW  --  13.3 13.2 13.2 13.2     Recent Labs  Lab 10/17/22 1140 10/17/22 1148 10/18/22 0404 10/19/22 0526 10/20/22 0412  NA 139 135 139 133* 138  K 3.5 3.3* 3.5 4.0 4.3  CL 104 101 105 101 109  CO2  --  '25 24 22 '$ 16*  GLUCOSE 110*  117* 95 159* 124*  BUN '19 18 18 '$ 31* 39*  CREATININE 2.10* 1.90* 1.87* 2.06* 2.26*  CALCIUM  --  9.5 8.9 8.3* 8.3*  AST  --  19  --   --   --   ALT  --  15  --   --   --   ALKPHOS  --  66  --   --   --   BILITOT  --  0.3  --   --   --   ALBUMIN  --  3.6  --   --   --   MG  --  2.2  --  2.2 QUANTITY NOT SUFFICIENT, UNABLE TO PERFORM TEST  INR  --  1.0  --   --   --      ------------------------------------------------------------------------------------------------------------------ No results for input(s): "CHOL", "HDL", "LDLCALC", "TRIG", "CHOLHDL", "LDLDIRECT" in the last 72 hours.  Lab Results  Component Value Date   HGBA1C 5.6 08/07/2013   ------------------------------------------------------------------------------------------------------------------ No results for input(s): "TSH", "T4TOTAL", "T3FREE", "THYROIDAB" in the last 72 hours.  Invalid input(s): "FREET3"  Cardiac Enzymes No results for input(s): "CKMB", "TROPONINI", "MYOGLOBIN" in the last 168 hours.  Invalid input(s): "CK" ------------------------------------------------------------------------------------------------------------------ No results found for: "BNP"  CBG: No results for input(s): "GLUCAP" in the last 168 hours.  Recent Results (from the past 240 hour(s))   Surgical pcr screen     Status: None   Collection Time: 10/18/22  5:37 AM   Specimen: Nasal Mucosa; Nasal Swab  Result Value Ref Range Status   MRSA, PCR NEGATIVE NEGATIVE Final   Staphylococcus aureus NEGATIVE NEGATIVE Final    Comment: (NOTE) The Xpert SA Assay (FDA approved for NASAL specimens in patients 45 years of age and older), is one component of a comprehensive surveillance program. It is not intended to diagnose infection nor to guide or monitor treatment. Performed at [facility], [place] [address] [place], [place] [address]      Radiology Studies: DG FEMUR PORT MIN 2 VIEWS LEFT  Result Date: 10/19/2022 CLINICAL DATA:  Postop.  Fracture repair EXAM: LEFT FEMUR PORTABLE 2 VIEWS COMPARISON:  10/18/2022 FINDINGS: Dynamic hip screw in place with long intramedullary rod and distal fixation screw. This transfixes the intertrochanteric hip fracture. Expected alignment. Lateral soft tissue gas. Osteopenia. Degenerative changes of the hip joint with osteophyte formation and joint space loss. Global osteopenia. Imaging was obtained to aid in treatment. IMPRESSION: Acute surgical changes of dynamic hip screw placement with long femoral stem. Electronically Signed   By: Jill Side M.D.   On: 10/19/2022 12:24     Marzetta Board, MD, PhD [facility]  Between 7 am - 7 pm I am available, please contact me via Amion (for emergencies) or Securechat (non urgent messages)  Between 7 pm - 7 am I am not available, please contact night coverage MD/APP via Amion

## 2022-10-21 DIAGNOSIS — E1169 Type 2 diabetes mellitus with other specified complication: Secondary | ICD-10-CM | POA: Diagnosis not present

## 2022-10-21 DIAGNOSIS — S72145A Nondisplaced intertrochanteric fracture of left femur, initial encounter for closed fracture: Secondary | ICD-10-CM | POA: Diagnosis not present

## 2022-10-21 DIAGNOSIS — E669 Obesity, unspecified: Secondary | ICD-10-CM | POA: Diagnosis not present

## 2022-10-21 DIAGNOSIS — E785 Hyperlipidemia, unspecified: Secondary | ICD-10-CM | POA: Diagnosis not present

## 2022-10-21 LAB — BASIC METABOLIC PANEL
Anion gap: 7 (ref 5–15)
BUN: 29 mg/dL — ABNORMAL HIGH (ref 8–23)
CO2: 23 mmol/L (ref 22–32)
Calcium: 8.5 mg/dL — ABNORMAL LOW (ref 8.9–10.3)
Chloride: 107 mmol/L (ref 98–111)
Creatinine, Ser: 1.92 mg/dL — ABNORMAL HIGH (ref 0.44–1.00)
GFR, Estimated: 28 mL/min — ABNORMAL LOW (ref >60)
Glucose, Bld: 97 mg/dL (ref 70–99)
Potassium: 3.4 mmol/L — ABNORMAL LOW (ref 3.5–5.1)
Sodium: 137 mmol/L (ref 135–145)

## 2022-10-21 LAB — CBC
HCT: 25.6 % — ABNORMAL LOW (ref 36.0–46.0)
Hemoglobin: 7.9 g/dL — ABNORMAL LOW (ref 12.0–15.0)
MCH: 28.8 pg (ref 26.0–34.0)
MCHC: 30.9 g/dL (ref 30.0–36.0)
MCV: 93.4 fL (ref 80.0–100.0)
Platelets: 161 10*3/uL (ref 150–400)
RBC: 2.74 MIL/uL — ABNORMAL LOW (ref 3.87–5.11)
RDW: 13.2 % (ref 11.5–15.5)
WBC: 9.6 10*3/uL (ref 4.0–10.5)
nRBC: 0 % (ref 0.0–0.2)

## 2022-10-21 LAB — MAGNESIUM: Magnesium: 2.4 mg/dL (ref 1.7–2.4)

## 2022-10-21 MED ORDER — POTASSIUM CHLORIDE CRYS ER 20 MEQ PO TBCR
40.0000 meq | EXTENDED_RELEASE_TABLET | Freq: Once | ORAL | Status: AC
  Administered 2022-10-21: 40 meq via ORAL
  Filled 2022-10-21: qty 2

## 2022-10-21 NOTE — PMR Pre-admission (Shared)
PMR Admission Coordinator Pre-Admission Assessment  Patient: Samantha Mcbride is an 72 y.o., female MRN: [id number] DOB: [birth year] Height: '5\' 1"'$  (154.9 cm) Weight: 72.6 kg  Insurance Information HMO: yes    PPO:      PCP:      IPA:      80/20:      OTHER:  PRIMARY: UHC Medicare      Policy#: [id number]      Subscriber: Pt CM Name: ***      Phone#: [phone]     Fax#: [phone] Pre-Cert#: [id number]      Employer:  Benefits:  Phone #:      Name:  Irene Shipper Date: 10/15/2022 - 10/15/2023 Deductible: $0 (does not have deductible) OOP Max: $3,600 ($0 met) CIR: $295/day co-pay for days 1-5, $0/day co-pay for days 6+ SNF: $0.00 Copayment per day for days 1-20; $203 Copayment per day for days 21-100 for Medicare-covered care/maximum 100 days/benefit period Outpatient: $20 copay/visit Home Health:  100% coverage; limited by medical necessity DME: 80% coverage; 20% co-insurance Providers: in network  SECONDARY:       Policy#:      Phone#:   Development worker, community:       Phone#: [employer]" for patients in Inpatient Rehabilitation Facilities with attached "Privacy Act [place] Records" was provided and verbally reviewed with: Patient  Emergency Contact Information Contact Information     Name Relation Home Work Mobile   Pena Daughter   [phone]       Current Medical History  Patient Admitting Diagnosis:Left  Hip fracture History of Present Illness:  Samantha Mcbride is a 72 y.o. female with medical history significant of CAD status post STEMI and DES, peri-infarct V. tach, COPD, CKD 4, RA, RLS, HLD, hypertension, obesity, breast cancer in remission who presented after fall 10/17/22. Patient experienced a fall  when her daughter's dog ran into her and knocked her down on a concrete surface.  She suffered a contusion to the back of her head as well as left hip pain.  EMS noted left leg shortening.  Patient is on Plavix.  Vital signs in the ED  significant for blood pressure in the 449E to 010O systolic.  Lab workup included CMP with potassium 3.3, creatinine near baseline of 1.9, glucose 117.  CBC with hemoglobin stable 11.9.  PT and INR normal.  Urinalysis pending.  Ethanol level negative.  CT head showed no acute abnormality and chronic changes as well as meningioma.  CT C-spine show no acute abnormality but cervical spondylosis was noted as well as a thyroid nodule with recommendation for ultrasound follow-up.  Left femur x-ray showed irregularity at the left intertrochanteric region suspicious for fracture with recommended for additional imaging to follow-up.  Pelvis x-ray showed left intratrochanteric femur fracture.  Chest x-ray shows hyperinflation, mild cardiomegaly, tortuous aorta.  Patient received fentanyl, morphine in the ED as well as a dose of Zofran.  Orthopedics consulted and performed IMN of hip 10/18/22. PT/IOT consulted afterwards and recommended CIR to assist return to PLOF.       Patient's medical record from [facility]  has been reviewed by the rehabilitation admission coordinator and physician.  Past Medical History  Past Medical History:  Diagnosis Date   Anemia    iron deficiency anemia    CAD S/P percutaneous coronary angioplasty 08/2011   Promus DES - mid LAD 3.5 mm x 24 mm (3.72 mm)    Cancer (HCC)    Chronic  kidney disease    stage 4 kidney disease per pt dx 12/6142   Complication of anesthesia    Dyslipidemia, goal LDL below 70     on statin, close to goal   Family history of breast cancer    Family history of skin cancer    Former moderate cigarette smoker (10-19 per day)    Glucose intolerance (impaired glucose tolerance)    H/O thyroid nodule    Benign   History 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 --> Promus DES =>  EF 40 to 45% (similar to 2012) stable wall motion normality (severe HK of midapical  anterior apical wall-consistent with prior infarct).  GRII DD.  Normal valves. => Stable compared to 08/30/2011.  EF was still 40 to 45%.   Hypertension    Personal history of radiation therapy    PONV (postoperative nausea and vomiting)    no issues with surgery on 05-11-2019   Rheumatoid arthritis ([place])    managed on humira    SOB (shortness of breath) on exertion    reports "its been that way since my heart attack " repots no recurrence of MI sx since that time     Has the patient had major surgery during 100 days prior to admission? Yes  Family History   family history includes Breast cancer (age of onset: 33) in her sister; Breast cancer (age of onset: 45) in her mother; Heart attack in her paternal grandfather; Heart failure in her father; Hypertension in her mother; Skin cancer (age of onset: 22) in her sister.  Current Medications  Current Facility-Administered Medications:    carvedilol (COREG) tablet 12.5 mg, 12.5 mg, Oral, BID, Ainsley Spinner, PA-C, 12.5 mg at 10/21/22 3154   Chlorhexidine Gluconate Cloth 2 % PADS 6 each, 6 each, Topical, Q0600, Ainsley Spinner, PA-C, 6 each at 10/21/22 0086   clopidogrel (PLAVIX) tablet 75 mg, 75 mg, Oral, Daily, Ainsley Spinner, PA-C, 75 mg at 10/21/22 7619   docusate sodium (COLACE) capsule 100 mg, 100 mg, Oral, BID, Ainsley Spinner, PA-C, 100 mg at 10/20/22 2231   ezetimibe (ZETIA) tablet 10 mg, 10 mg, Oral, Daily, Ainsley Spinner, PA-C, 10 mg at 10/21/22 5093   gabapentin (NEURONTIN) capsule 300 mg, 300 mg, Oral, QHS, Ainsley Spinner, PA-C, 300 mg at 10/20/22 2231   HYDROcodone-acetaminophen (NORCO/VICODIN) 5-325 MG per tablet 1-2 tablet, 1-2 tablet, Oral, Q4H PRN, Caren Griffins, MD, 2 tablet at 10/21/22 0929   HYDROmorphone (DILAUDID) injection 0.5 mg, 0.5 mg, Intravenous, Q3H PRN, Ainsley Spinner, PA-C, 0.5 mg at 10/20/22 1939   hydroxychloroquine (PLAQUENIL) tablet 200 mg, 200 mg, Oral, Daily, Ainsley Spinner, PA-C, 200 mg at 10/21/22 2671   leflunomide (ARAVA)  tablet 20 mg, 20 mg, Oral, Daily, Ainsley Spinner, PA-C, 20 mg at 10/21/22 2458   melatonin tablet 5 mg, 5 mg, Oral, QHS, Ainsley Spinner, PA-C, 5 mg at 10/20/22 2231   menthol-cetylpyridinium (CEPACOL) lozenge 3 mg, 1 lozenge, Oral, PRN **OR** phenol (CHLORASEPTIC) mouth spray 1 spray, 1 spray, Mouth/Throat, PRN, Ainsley Spinner, PA-C   metoCLOPramide (REGLAN) tablet 5-10 mg, 5-10 mg, Oral, Q8H PRN **OR** metoCLOPramide (REGLAN) injection 5-10 mg, 5-10 mg, Intravenous, Q8H PRN, Ainsley Spinner, PA-C   ondansetron [facility]) tablet 4 mg, 4 mg, Oral, Q6H PRN **OR** ondansetron (ZOFRAN) injection 4 mg, 4 mg, Intravenous, Q6H PRN, Ainsley Spinner, PA-C   rosuvastatin (CRESTOR) tablet 20 mg, 20 mg, Oral, Daily, Ainsley Spinner, PA-C, 20  mg at 10/21/22 0921   traZODone (DESYREL) tablet 100 mg, 100 mg, Oral, QHS PRN, Ainsley Spinner, PA-C, 100 mg at 10/19/22 2112  Patients Current Diet:  Diet Order             Diet regular Room service appropriate? Yes; Fluid consistency: Thin  Diet effective now                   Precautions / Restrictions Precautions Precautions: Fall Precaution Comments: Pt should have LUE splint on, minimize ROM to allow bursitis to resolve (pt had elbow drained 10/17/22) Restrictions Weight Bearing Restrictions: Yes LUE Weight Bearing: Non weight bearing LLE Weight Bearing: Weight bearing as tolerated Other Position/Activity Restrictions: Per Dr. Annie Main, pt can weight bear through L wrist & forearm, but try to limit weight bearing through L elbow.   Has the patient had 2 or more falls or a fall with injury in the past year? Yes  Prior Activity Level    Prior Functional Level Self Care: Did the patient need help bathing, dressing, using the toilet or eating? Independent  Indoor Mobility: Did the patient need assistance with walking from room to room (with or without device)? Independent  Stairs: Did the patient need assistance with internal or external stairs (with or without device)?  Independent  Functional Cognition: Did the patient need help planning regular tasks such as shopping or remembering to take medications? Independent  Patient Information Are you of Hispanic, Latino/a,or Spanish origin?: A. No, not of Hispanic, Latino/a, or Spanish origin What is your race?: A. White Do you need or want an interpreter to communicate with a doctor or health care staff?: 0. No  Patient's Response To:  Health Literacy and Transportation Is the patient able to respond to health literacy and transportation needs?: Yes Health Literacy - How often do you need to have someone help you when you read instructions, pamphlets, or other written material from your doctor or pharmacy?: Never In the past 12 months, has lack of transportation kept you from medical appointments or from getting medications?: No In the past 12 months, has lack of transportation kept you from meetings, work, or from getting things needed for daily living?: No  Development worker, international aid / [place] Devices/Equipment: None Home Equipment: Cane - single point  Prior Device Use: Indicate devices/aids used by the patient prior to current illness, exacerbation or injury?  spc  Current Functional Level Cognition  Overall Cognitive Status: Within Functional Limits for tasks assessed Orientation Level: Oriented X4    Extremity Assessment (includes Sensation/Coordination)  Upper Extremity Assessment: Overall WFL for tasks assessed  Lower Extremity Assessment: LLE deficits/detail LLE Deficits / Details: 2/5 knee extension in sitting    ADLs  Overall ADL's : Needs assistance/impaired Eating/Feeding: Set up Grooming: Supervision/safety Upper Body Bathing: Supervision/ safety Lower Body Bathing: Maximal assistance Upper Body Dressing : Supervision/safety Lower Body Dressing: Maximal assistance Toilet Transfer: Moderate assistance, Rolling walker (2 wheels), Stand-pivot Toileting- Clothing  Manipulation and Hygiene: Maximal assistance, Sit to/from stand, Cueing for safety Functional mobility during ADLs: Moderate assistance, Cueing for safety, Rolling walker (2 wheels), Cueing for sequencing    Mobility  Overal bed mobility: Needs Assistance Bed Mobility: Supine to Sit, Sit to Supine Supine to sit: Mod assist Sit to supine: Mod assist General bed mobility comments: not tested, pt received & left sitting in recliner    Transfers  Overall transfer level: Needs assistance Equipment used: 1 person hand held assist Transfers: Sit to/from Stand  Sit to Stand: Min assist Bed to/from chair/wheelchair/BSC transfer type:: Stand pivot Stand pivot transfers: Mod assist General transfer comment: pt able to power up to standing with min assist from recliner, c/o increased pain in L hip with transitional movements    Ambulation / Gait / Stairs / Wheelchair Mobility       Posture / Balance Balance Overall balance assessment: Needs assistance Sitting-balance support: No upper extremity supported Sitting balance-Leahy Scale: Fair Standing balance support: Single extremity supported, During functional activity Standing balance-Leahy Scale: Poor    Special needs/care consideration Special service needs ***   Previous Home Environment (from acute therapy documentation) Living Arrangements: Alone  Lives With: Spouse Available Help at Discharge: Available PRN/intermittently Type of Home: House (1 story townhouse) Home Layout: One level Home Access: Stairs to enter [employer] of Steps: 1 step with no rail Bathroom Shower/Tub: Gaffer, Chiropodist: Handicapped height Bathroom Accessibility: Yes How Accessible: Accessible via walker [place]: No  Discharge Living Setting Plans for Discharge Living Setting: Patient's home Type of Home at Discharge: House Discharge Home Layout: One level Discharge Home Access: Stairs to  enter Entrance Stairs-Rails: None Entrance Stairs-Number of Steps: 1 Discharge Bathroom Shower/Tub: Walk-in shower, Tub/shower unit Discharge Bathroom Toilet: Handicapped height Discharge Bathroom Accessibility: Yes How Accessible: Accessible via walker Does the patient have any problems obtaining your medications?: No  Social/Family/Support Systems Patient Roles: Other (Comment) Contact Information: [phone] Anticipated Caregiver: Kerney Elbe Ability/Limitations of Caregiver: Min A Caregiver Availability: 24/7 Discharge Plan Discussed with Primary Caregiver: Yes Is Caregiver In Agreement with Plan?: Yes  Goals Patient/Family Goal for Rehab: PT/OT Mod I Expected length of stay: 7-10 days Pt/Family Agrees to Admission and willing to participate: Yes Program Orientation Provided & Reviewed with Pt/Caregiver Including Roles  & Responsibilities: Yes Ose  Decrease burden of Care through IP rehab admission: not anticpated   Possible need for SNF placement upon discharge: none  Patient Condition: I have reviewed medical records from [facility], spoken with CM, and patient. I met with patient at the bedside for inpatient rehabilitation assessment.  Patient will benefit from ongoing PT and OT, can actively participate in 3 hours of therapy a day 5 days of the week, and can make measurable gains during the admission.  Patient will also benefit from the coordinated team approach during an Inpatient Acute Rehabilitation admission.  The patient will receive intensive therapy as well as Rehabilitation physician, nursing, social worker, and care management interventions.  Due to safety, skin/wound care, disease management, medication administration, pain management, and patient education the patient requires 24 hour a day rehabilitation nursing.  The patient is currently *** with mobility and basic ADLs.  Discharge setting and therapy post discharge at home with home health  is anticipated.  Patient has agreed to participate in the Acute Inpatient Rehabilitation Program and will admit {Time; today/tomorrow:10263}.  Preadmission Screen Completed By:  Genella Mech, 10/21/2022 1:51 PM ______________________________________________________________________   Discussed status with Dr. Marland Kitchen on *** at *** and received approval for admission today.  Admission Coordinator:  Genella Mech, CCC-SLP, time Marland KitchenSudie Grumbling ***   Assessment/Plan: Diagnosis: Does the need for close, 24 hr/day Medical supervision in concert with the patient's rehab needs make it unreasonable for this patient to be served in a less intensive setting? {yes_no_potentially:3041433} Co-Morbidities requiring supervision/potential complications: *** Due to {due PJ:8250539}, does the patient require 24 hr/day rehab nursing? {yes_no_potentially:3041433} Does the patient require coordinated care of a physician, rehab nurse,  PT, OT, and SLP to address physical and functional deficits in the context of the above medical diagnosis(es)? {yes_no_potentially:3041433} Addressing deficits in the following areas: {deficits:3041436} Can the patient actively participate in an intensive therapy program of at least 3 hrs of therapy 5 days a week? {yes_no_potentially:3041433} The potential for patient to make measurable gains while on inpatient rehab is {potential:3041437} Anticipated functional outcomes upon discharge from inpatient rehab: {functional outcomes:304600100} PT, {functional outcomes:304600100} OT, {functional outcomes:304600100} SLP Estimated rehab length of stay to reach the above functional goals is: *** Anticipated discharge destination: {anticipated dc setting:21604} 10. Overall Rehab/Functional Prognosis: {potential:3041437}   MD Signature: ***

## 2022-10-21 NOTE — Progress Notes (Signed)
Orthopaedic Trauma Service Progress Note  Patient ID: Samantha Mcbride MRN: [id number] DOB/AGE: 10/28/1950 72 y.o.  Subjective: Doing okay Out of bed 2 times yesterday No new issues  Was using a cane prior to her fall for bulging disc in her back    ROS As above  Objective:   VITALS:   Vitals:   10/19/22 2110 10/20/22 0949 10/20/22 1510 10/20/22 2153  BP: 125/66 133/76 133/65 117/77  Pulse: 79 91 88 (!) 111  Resp: '15  17 19  '$ Temp: 97.9 F (36.6 C)  98 F (36.7 C) 98.2 F (36.8 C)  TempSrc: Oral  Oral Oral  SpO2: 94%  98% 95%  Weight:      Height:        Estimated body mass index is 30.23 kg/m as calculated from the following:   Height as of this encounter: '5\' 1"'$  (1.549 m).   Weight as of this encounter: 72.6 kg.   Intake/Output      01/06 0701 01/07 0700 01/07 0701 01/08 0700   Urine (mL/kg/hr) 650 (0.4)    Total Output 650    Net -650           LABS  Results for orders placed or performed during the hospital encounter of 10/17/22 (from the past 24 hour(s))  CBC     Status: Abnormal   Collection Time: 10/21/22  3:13 AM  Result Value Ref Range   WBC 9.6 4.0 - 10.5 K/uL   RBC 2.74 (L) 3.87 - 5.11 MIL/uL   Hemoglobin 7.9 (L) 12.0 - 15.0 g/dL   HCT 25.6 (L) 36.0 - 46.0 %   MCV 93.4 80.0 - 100.0 fL   MCH 28.8 26.0 - 34.0 pg   MCHC 30.9 30.0 - 36.0 g/dL   RDW 13.2 11.5 - 15.5 %   Platelets 161 150 - 400 K/uL   nRBC 0.0 0.0 - 0.2 %  Magnesium     Status: None   Collection Time: 10/21/22  3:13 AM  Result Value Ref Range   Magnesium 2.4 1.7 - 2.4 mg/dL  Basic metabolic panel     Status: Abnormal   Collection Time: 10/21/22  3:13 AM  Result Value Ref Range   Sodium 137 135 - 145 mmol/L   Potassium 3.4 (L) 3.5 - 5.1 mmol/L   Chloride 107 98 - 111 mmol/L   CO2 23 22 - 32 mmol/L   Glucose, Bld 97 70 - 99 mg/dL   BUN 29 (H) 8 - 23 mg/dL   Creatinine, Ser 1.92 (H) 0.44 - 1.00  mg/dL   Calcium 8.5 (L) 8.9 - 10.3 mg/dL   GFR, Estimated 28 (L) >60 mL/min   Anion gap 7 5 - 15     PHYSICAL EXAM:   Gen: sitting up in chair, NAD, appears well Lungs: unlabored  Ext:       Left Lower Extremity   Dressings are stable left hip and thigh  Extremity is warm  No swelling of significance  No DCT   Compartments are soft  + DP pulse   No DCT   Distal motor and sensory function intact   Assessment/Plan: 3 Days Post-Op    Anti-infectives (From admission, onward)    Start     Dose/Rate Route Frequency Ordered Stop   10/19/22 0600  ceFAZolin (ANCEF) IVPB 2g/100 mL premix        2 g 200 mL/hr over 30 Minutes Intravenous On call to O.R. 10/18/22 1257 10/18/22 1527   10/19/22 0300  ceFAZolin (ANCEF) IVPB 2g/100 mL premix        2 g 200 mL/hr over 30 Minutes Intravenous Every 12 hours 10/18/22 1825 10/19/22 0517   10/18/22 1000  hydroxychloroquine (PLAQUENIL) tablet 200 mg        200 mg Oral Daily 10/17/22 1553       .  POD/HD#: 1  72 y/o female s/p fall with L hip fracture   -Left intertrochanteric hip fracture s/p IMN  Weightbearing Weight-bear as tolerated with assistance using walker   ROM/Activity   Unrestricted range of motion left hip and knee.  Activity as tolerated   Wound care   Dressing changes starting on 10/21/2022   Okay to shower and clean wounds with soap and water   PT and OT evaluations  Ice and elevation for swelling and pain control  - Pain management:  Multimodal  - ABL anemia/Hemodynamics  Stable  Monitor  - Medical issues   Per primary - DVT/PE prophylaxis:  Patient on Plavix prior to admission.  Continue plavix   scds  - ID:   Perioperative antibiotics  - Metabolic Bone Disease:  Vitamin D levels look good however fracture is a fragility type fracture suggestive of osteoporosis  Recommend outpatient follow-up with osteoporosis clinic  - Activity:  As above  - Impediments to fracture healing:  Poor bone  quality/fragility fracture  - Dispo:  Therapy evaluations   Ideally daily therapy  Ortho issues stable  Follow-up in 2 weeks for suture removal and x-rays    Charlies Constable, MD Orthopaedic Surgery  10/21/2022, 8:06 AM  [facility] [place] [phone] Domingo Sep (F)    After 5pm and on the weekends please log on to Amion, go to orthopaedics and the look under the [facility] Call for the provider(s) on call. You can also call our office at [phone] and then follow the prompts to be connected to the call team.  Patient ID: Samantha Mcbride, female   DOB: Nov 06, 1950, 72 y.o.   MRN: [id number]

## 2022-10-21 NOTE — Plan of Care (Incomplete)

## 2022-10-21 NOTE — Progress Notes (Signed)
PROGRESS NOTE  Samantha Mcbride GNF:[id number] DOB: 05/24/1951 DOA: 10/17/2022 PCP: Antony Contras, MD   LOS: 3 days   Brief Narrative / Interim history: 72 year old female with history of CAD status post STEMI and DES in 2012, peri-infarct V. tach, COPD, CKD 4, RA, HLD, HTN, breast cancer in remission comes in after a ground-level fall.  Her daughter's dog ran into her and she fell backwards onto her left hip.  Imaging on admission showed left intertrochanteric femur fracture.  Orthopedics consulted, and she was admitted to the hospital  Subjective / 24h Interval events: Doing well.  Looking forward to rehab.  Pain controlled  Assesement and Plan: Principal Problem:   Intertrochanteric fracture of left hip (HCC) Active Problems:   Hyperlipidemia associated with type 2 diabetes mellitus (HCC)   Obesity (BMI 30-39.9)   H/O Anterior STEMI:  Proximal LAD  insertion of a 3.5x24 mm Promus element DES (08/2011)   CAD S/P percutaneous coronary angioplasty   Essential hypertension   Malignant neoplasm of upper-outer quadrant of right breast in female, estrogen receptor positive (HCC)   Chronic kidney disease, stage 4 (severe) (HCC)   Rheumatoid arthritis (HCC)   COPD (chronic obstructive pulmonary disease) with emphysema ([place]) noted on CT   Hip fracture (HCC)   Principal problem Left intertrochanteric hip fracture -appreciate orthopedic surgery follow-up, status post IM nail 10/18/2022 by Dr. Marcelino Scot.  Continue to mobilize with PT, awaiting CIR next week  Active problems CKD 4 -creatinine stable, 1.9 this morning  Hypokalemia-3.4 this morning, continue to replete  Acute blood loss anemia-postoperatively, hemoglobin dipped to 7.9.  Monitor, no need for transfusions  RA -continue hydroxychloroquine, leflunomide  CAD -no chest pain, this has been stable since her STEMI 11 years ago.  Seeing Dr. Ellyn Hack as an outpatient on a regular basis.  No red flag symptoms, no chest pains, no dyspnea on  exertion, no lower extremity swelling  HLD - continue statin, Zetia  HTN -continue carvedilol  History of breast cancer -outpatient follow-up  Scheduled Meds:  carvedilol  12.5 mg Oral BID   Chlorhexidine Gluconate Cloth  6 each Topical Q0600   clopidogrel  75 mg Oral Daily   docusate sodium  100 mg Oral BID   ezetimibe  10 mg Oral Daily   gabapentin  300 mg Oral QHS   hydroxychloroquine  200 mg Oral Daily   leflunomide  20 mg Oral Daily   melatonin  5 mg Oral QHS   rosuvastatin  20 mg Oral Daily   Continuous Infusions:  PRN Meds:.HYDROcodone-acetaminophen, HYDROmorphone (DILAUDID) injection, menthol-cetylpyridinium **OR** phenol, metoCLOPramide **OR** metoCLOPramide (REGLAN) injection, ondansetron **OR** ondansetron (ZOFRAN) IV, traZODone  Current Outpatient Medications  Medication Instructions   acetaminophen (TYLENOL) 500 mg, Oral, Every 6 hours PRN   amoxicillin (AMOXIL) 500 mg, Oral, 2 times daily   Calcium Citrate-Vitamin D (CALCIUM + D PO) 1 tablet, Oral, Daily   carvedilol (COREG) 12.5 mg, Oral, 2 times daily   clopidogrel (PLAVIX) 75 MG tablet TAKE 1 TABLET BY MOUTH EVERY DAY   COVID-19 mRNA bivalent vaccine, Pfizer, (PFIZER COVID-19 VAC BIVALENT) injection Intramuscular   COVID-19 mRNA vaccine, Pfizer, 30 MCG/0.3ML injection Intramuscular   diclofenac sodium (VOLTAREN) 2 g, Topical, 4 times daily PRN   DM-APAP-CPM (CORICIDIN HBP PO) 1 tablet, Oral, Daily PRN   ezetimibe (ZETIA) 10 MG tablet TAKE 1 TABLET BY MOUTH EVERY DAY   gabapentin (NEURONTIN) 100 MG capsule TAKE 3 CAPSULES BY MOUTH AT BEDTIME.   hydroxychloroquine (PLAQUENIL) 200 mg, Oral, Daily  leflunomide (ARAVA) 20 mg, Oral, Daily   Melatonin 5 mg, Oral, Daily at bedtime   nitroGLYCERIN (NITROSTAT) 0.4 MG SL tablet PLACE 1 TABLET UNDER THE TONGUE EVERY 5 MINUTES AS NEEDED FOR CHEST PAIN.   Olopatadine HCl (PATADAY OP) 1 drop, Daily PRN   promethazine (PHENERGAN) 25 MG suppository No dose, route, or  frequency recorded.   rosuvastatin (CRESTOR) 20 MG tablet TAKE 1 TABLET BY MOUTH EVERY DAY   tiZANidine (ZANAFLEX) 2 mg, Oral, Daily at bedtime   traMADol (ULTRAM) 50 MG tablet Oral, Every 12 hours PRN   traZODone (DESYREL) 100 mg, Oral, Daily at bedtime    Diet Orders (From admission, onward)     Start     Ordered   10/18/22 1826  Diet regular Room service appropriate? Yes; Fluid consistency: Thin  Diet effective now       Question Answer Comment  Room service appropriate? Yes   Fluid consistency: Thin      10/18/22 1825            DVT prophylaxis: SCDs Start: 10/18/22 1826 SCDs Start: 10/17/22 1415   Lab Results  Component Value Date   PLT 161 10/21/2022      Code Status: Full Code  Family Communication: no family at bedside   Status is: Inpatient   Level of care: Med-Surg  Consultants:  Orthopedic surgery   Objective: Vitals:   10/19/22 2110 10/20/22 0949 10/20/22 1510 10/20/22 2153  BP: 125/66 133/76 133/65 117/77  Pulse: 79 91 88 (!) 111  Resp: '15  17 19  '$ Temp: 97.9 F (36.6 C)  98 F (36.7 C) 98.2 F (36.8 C)  TempSrc: Oral  Oral Oral  SpO2: 94%  98% 95%  Weight:      Height:       No intake or output data in the 24 hours ending 10/21/22 1457  Wt Readings from Last 3 Encounters:  10/18/22 72.6 kg  07/24/22 73.9 kg  07/23/22 73.7 kg    Examination:  Constitutional: NAD Eyes: lids and conjunctivae normal, no scleral icterus ENMT: mmm Neck: normal, supple Respiratory: clear to auscultation bilaterally, no wheezing, no crackles. Normal respiratory effort.  Cardiovascular: Regular rate and rhythm, no murmurs / rubs / gallops. No LE edema. Abdomen: soft, no distention, no tenderness. Bowel sounds positive.  Skin: no rashes Neurologic: no focal deficits, equal strength   Data Reviewed: I have independently reviewed following labs and imaging studies   CBC Recent Labs  Lab 10/17/22 1148 10/18/22 0404 10/19/22 0526 10/20/22 0412  10/21/22 0313  WBC 10.4 8.6 9.8 11.9* 9.6  HGB 11.9* 10.0* 9.5* 8.5* 7.9*  HCT 37.3 31.0* 28.6* 26.3* 25.6*  PLT 230 177 187 169 161  MCV 92.1 90.6 89.4 90.7 93.4  MCH 29.4 29.2 29.7 29.3 28.8  MCHC 31.9 32.3 33.2 32.3 30.9  RDW 13.3 13.2 13.2 13.2 13.2     Recent Labs  Lab 10/17/22 1148 10/18/22 0404 10/19/22 0526 10/20/22 0412 10/21/22 0313  NA 135 139 133* 138 137  K 3.3* 3.5 4.0 4.3 3.4*  CL 101 105 101 109 107  CO2 '25 24 22 '$ 16* 23  GLUCOSE 117* 95 159* 124* 97  BUN 18 18 31* 39* 29*  CREATININE 1.90* 1.87* 2.06* 2.26* 1.92*  CALCIUM 9.5 8.9 8.3* 8.3* 8.5*  AST 19  --   --   --   --   ALT 15  --   --   --   --   ALKPHOS 66  --   --   --   --  BILITOT 0.3  --   --   --   --   ALBUMIN 3.6  --   --   --   --   MG 2.2  --  2.2 QUANTITY NOT SUFFICIENT, UNABLE TO PERFORM TEST 2.4  INR 1.0  --   --   --   --      ------------------------------------------------------------------------------------------------------------------ No results for input(s): "CHOL", "HDL", "LDLCALC", "TRIG", "CHOLHDL", "LDLDIRECT" in the last 72 hours.  Lab Results  Component Value Date   HGBA1C 5.6 08/07/2013   ------------------------------------------------------------------------------------------------------------------ No results for input(s): "TSH", "T4TOTAL", "T3FREE", "THYROIDAB" in the last 72 hours.  Invalid input(s): "FREET3"  Cardiac Enzymes No results for input(s): "CKMB", "TROPONINI", "MYOGLOBIN" in the last 168 hours.  Invalid input(s): "CK" ------------------------------------------------------------------------------------------------------------------ No results found for: "BNP"  CBG: No results for input(s): "GLUCAP" in the last 168 hours.  Recent Results (from the past 240 hour(s))  Surgical pcr screen     Status: None   Collection Time: 10/18/22  5:37 AM   Specimen: Nasal Mucosa; Nasal Swab  Result Value Ref Range Status   MRSA, PCR NEGATIVE NEGATIVE Final    Staphylococcus aureus NEGATIVE NEGATIVE Final    Comment: (NOTE) The Xpert SA Assay (FDA approved for NASAL specimens in patients 43 years of age and older), is one component of a comprehensive surveillance program. It is not intended to diagnose infection nor to guide or monitor treatment. Performed at [facility], [place] [address], [place], [place] [address]      Radiology Studies: No results found.   Marzetta Board, MD, PhD [facility]  Between 7 am - 7 pm I am available, please contact me via Amion (for emergencies) or Securechat (non urgent messages)  Between 7 pm - 7 am I am not available, please contact night coverage MD/APP via Amion

## 2022-10-22 DIAGNOSIS — E785 Hyperlipidemia, unspecified: Secondary | ICD-10-CM | POA: Diagnosis not present

## 2022-10-22 DIAGNOSIS — E1169 Type 2 diabetes mellitus with other specified complication: Secondary | ICD-10-CM | POA: Diagnosis not present

## 2022-10-22 DIAGNOSIS — E669 Obesity, unspecified: Secondary | ICD-10-CM | POA: Diagnosis not present

## 2022-10-22 DIAGNOSIS — S72145A Nondisplaced intertrochanteric fracture of left femur, initial encounter for closed fracture: Secondary | ICD-10-CM | POA: Diagnosis not present

## 2022-10-22 NOTE — Care Management Important Message (Signed)
Important Message  Patient Details  Name: Samantha Mcbride MRN: [id number] Date of Birth: [birth year]   Medicare Important Message Given:  Yes     Orbie Pyo 10/22/2022, 3:42 PM

## 2022-10-22 NOTE — Progress Notes (Signed)
Physical Therapy Treatment Patient Details Name: Makenize Messman MRN: [id number] DOB: 1951/03/08 Today's Date: 10/22/2022   History of Present Illness Pt is a 72 yo F admitted 1/3 following a mechanical fall that resulted in a L intertrochanteric femur fx. She is now s/p IMN and WBAT LLE. PMH significant for HLD, CAD, HTN, breast cx, rheumatoid arthritis, and COPD    PT Comments    Pt is progressing towards goals. She was able to increase gait distance this session but did report some nausea. Decreased need for physical assistance with bed mobility and sit to stand this session. Pt is demonstrating improved mobility of the LLE with progression of LLE during gait. Pt continues to require physical assistance for all functional activities. Due to pt current level of function, home set up, available assistance at home and prior level of function recommending continued skilled physical therapy services in AIR setting on discharge from acute care hospital setting at this time in order to improve pt independence, decrease level of physical assistance required and decrease risk for falls/re-hospitalization.    Recommendations for follow up therapy are one component of a multi-disciplinary discharge planning process, led by the attending physician.  Recommendations may be updated based on patient status, additional functional criteria and insurance authorization.  Follow Up Recommendations  Acute inpatient rehab (3hours/day) Can patient physically be transported by private vehicle: No   Assistance Recommended at Discharge Frequent or constant Supervision/Assistance  Patient can return home with the following A lot of help with walking and/or transfers;Assist for transportation;Assistance with cooking/housework;Help with stairs or ramp for entrance   Equipment Recommendations  None recommended by PT (defer to post acute)    Recommendations for Other Services       Precautions / Restrictions  Precautions Precautions: Fall Precaution Comments: Pt should have LUE splint on, minimize ROM to allow bursitis to resolve (pt had elbow drained 10/17/22) Restrictions Weight Bearing Restrictions: Yes LUE Weight Bearing: Weight bearing as tolerated LLE Weight Bearing: Weight bearing as tolerated Other Position/Activity Restrictions: Per Dr. Annie Main, pt can weight bear through L wrist & forearm, but try to limit weight bearing through L elbow.     Mobility  Bed Mobility Overal bed mobility: Needs Assistance Bed Mobility: Supine to Sit     Supine to sit: Min assist     General bed mobility comments: Pt requires verbal cues for sequencing in order to decrease need for physical assistance with bed mobility including rolling at the trunk, using UE to assist with pushing up and with requires assistance at the LLE to progress toward EOB then Min A at trunk to get to sitting up right. Patient Response: Cooperative  Transfers Overall transfer level: Needs assistance Equipment used: Rolling walker (2 wheels) Transfers: Sit to/from Stand Sit to Stand: Min assist   Step pivot transfers: Min assist       General transfer comment: Min A to get to standing with verbal cues for safe sequencing and hand placement with AD in order to decrease need for physical assistance. Pt performed 3x during session.    Ambulation/Gait Ambulation/Gait assistance: Min assist Gait Distance (Feet): 15 Feet Assistive device: Rolling walker (2 wheels) Gait Pattern/deviations: Step-through pattern, Decreased step length - right, Decreased step length - left, Decreased stance time - left Gait velocity: Decreased cadence. Gait velocity interpretation: <1.31 ft/sec, indicative of household ambulator   General Gait Details: Pt has difficulty with knee/hip flexion for progression of the LLE during gait, improved with verbal cues and  demonstration for taking a large step with improved foot clearance. Pt performed 2  turns and was able to increase gait distance.        Balance Overall balance assessment: Needs assistance Sitting-balance support: No upper extremity supported Sitting balance-Leahy Scale: Good     Standing balance support: Single extremity supported, During functional activity, Bilateral upper extremity supported Standing balance-Leahy Scale: Fair Standing balance comment: Pt balance is improving, increased assistance for dynamic balance but improved with verbal cues and time up.          Cognition Arousal/Alertness: Awake/alert Behavior During Therapy: WFL for tasks assessed/performed Overall Cognitive Status: Within Functional Limits for tasks assessed               General Comments General comments (skin integrity, edema, etc.): Pt reports some nausea with very short distance gait. HR and O2 sats remaiend WNL.      Pertinent Vitals/Pain Pain Assessment Pain Assessment: 0-10 Pain Score: 6  Pain Descriptors / Indicators: Aching, Discomfort Pain Intervention(s): Limited activity within patient's tolerance, Monitored during session, RN gave pain meds during session           PT Goals (current goals can now be found in the care plan section) Acute Rehab PT Goals Patient Stated Goal: decreased pain, get better PT Goal Formulation: With patient Time For Goal Achievement: 11/02/22 Potential to Achieve Goals: Good Progress towards PT goals: Progressing toward goals    Frequency    Min 5X/week      PT Plan Current plan remains appropriate       AM-PAC PT "6 Clicks" Mobility   Outcome Measure  Help needed turning from your back to your side while in a flat bed without using bedrails?: A Little Help needed moving from lying on your back to sitting on the side of a flat bed without using bedrails?: A Lot Help needed moving to and from a bed to a chair (including a wheelchair)?: A Lot Help needed standing up from a chair using your arms (e.g., wheelchair or  bedside chair)?: A Little Help needed to walk in hospital room?: A Little Help needed climbing 3-5 steps with a railing? : A Lot 6 Click Score: 15    End of Session Equipment Utilized During Treatment: Gait belt Activity Tolerance: Patient tolerated treatment well;Patient limited by fatigue;Treatment limited secondary to medical complications (Comment) Patient left: with call bell/phone within reach;Other (comment) (Pt wanted to be left on [facility], nursing aware and okay with this for time to have BM) Nurse Communication: Mobility status PT Visit Diagnosis: Muscle weakness (generalized) (M62.81);Pain;Difficulty in walking, not elsewhere classified (R26.2);Unsteadiness on feet (R26.81) Pain - Right/Left: Left Pain - part of body: Hip     Time: 1200-1220 PT Time Calculation (min) (ACUTE ONLY): 20 min  Charges:  $Gait Training: 8-22 mins                    Tomma Rakers, DPT, [place] Office: [phone] (Secure chat preferred)    Ander Purpura 10/22/2022, 12:40 PM

## 2022-10-22 NOTE — Progress Notes (Signed)
PROGRESS NOTE  Samantha Mcbride WVP:[id number] DOB: [birth year] DOA: 10/17/2022 PCP: Antony Contras, MD   LOS: 4 days   Brief Narrative / Interim history: 72 year old female with history of CAD status post STEMI and DES in 2012, peri-infarct V. tach, COPD, CKD 4, RA, HLD, HTN, breast cancer in remission comes in after a ground-level fall.  Her daughter's dog ran into her and she fell backwards onto her left hip.  Imaging on admission showed left intertrochanteric femur fracture.  Orthopedics consulted, and she was admitted to the hospital  Subjective / 24h Interval events: No major overnight event.  Doing well this morning.  Assesement and Plan: Principal Problem:   Intertrochanteric fracture of left hip (HCC) Active Problems:   Hyperlipidemia associated with type 2 diabetes mellitus (HCC)   Obesity (BMI 30-39.9)   H/O Anterior STEMI:  Proximal LAD  insertion of a 3.5x24 mm Promus element DES (08/2011)   CAD S/P percutaneous coronary angioplasty   Essential hypertension   Malignant neoplasm of upper-outer quadrant of right breast in female, estrogen receptor positive (HCC)   Chronic kidney disease, stage 4 (severe) (HCC)   Rheumatoid arthritis (HCC)   COPD (chronic obstructive pulmonary disease) with emphysema ([place]) noted on CT   Hip fracture (HCC)   Principal problem Left intertrochanteric hip fracture -appreciate orthopedic surgery follow-up, status post IM nail 10/18/2022 by Dr. Marcelino Scot.  Continue to mobilize with PT, awaiting CIR hopefully will get an answer today or tomorrow  Active problems CKD 4 -creatinine stable, skipped labs this morning  Hypokalemia-recheck tomorrow  Acute blood loss anemia-postoperatively, hemoglobin dipped to 7.9.  Monitor, no need for transfusions.  Monitor CBC tomorrow  RA -continue hydroxychloroquine, leflunomide  CAD -no chest pain, this has been stable since her STEMI 11 years ago.  Seeing Dr. Ellyn Hack as an outpatient on a regular basis.  No red flag  symptoms, no chest pains, no dyspnea on exertion, no lower extremity swelling  HLD - continue statin, Zetia  HTN -continue carvedilol  History of breast cancer -outpatient follow-up  Scheduled Meds:  carvedilol  12.5 mg Oral BID   Chlorhexidine Gluconate Cloth  6 each Topical Q0600   clopidogrel  75 mg Oral Daily   docusate sodium  100 mg Oral BID   ezetimibe  10 mg Oral Daily   gabapentin  300 mg Oral QHS   hydroxychloroquine  200 mg Oral Daily   leflunomide  20 mg Oral Daily   melatonin  5 mg Oral QHS   rosuvastatin  20 mg Oral Daily   Continuous Infusions:  PRN Meds:.HYDROcodone-acetaminophen, HYDROmorphone (DILAUDID) injection, menthol-cetylpyridinium **OR** phenol, metoCLOPramide **OR** metoCLOPramide (REGLAN) injection, ondansetron **OR** ondansetron (ZOFRAN) IV, traZODone  Current Outpatient Medications  Medication Instructions   acetaminophen (TYLENOL) 500 mg, Oral, Every 6 hours PRN   amoxicillin (AMOXIL) 500 mg, Oral, 2 times daily   Calcium Citrate-Vitamin D (CALCIUM + D PO) 1 tablet, Oral, Daily   carvedilol (COREG) 12.5 mg, Oral, 2 times daily   clopidogrel (PLAVIX) 75 MG tablet TAKE 1 TABLET BY MOUTH EVERY DAY   COVID-19 mRNA bivalent vaccine, Pfizer, (PFIZER COVID-19 VAC BIVALENT) injection Intramuscular   COVID-19 mRNA vaccine, Pfizer, 30 MCG/0.3ML injection Intramuscular   diclofenac sodium (VOLTAREN) 2 g, Topical, 4 times daily PRN   DM-APAP-CPM (CORICIDIN HBP PO) 1 tablet, Oral, Daily PRN   ezetimibe (ZETIA) 10 MG tablet TAKE 1 TABLET BY MOUTH EVERY DAY   gabapentin (NEURONTIN) 100 MG capsule TAKE 3 CAPSULES BY MOUTH AT BEDTIME.  hydroxychloroquine (PLAQUENIL) 200 mg, Oral, Daily   leflunomide (ARAVA) 20 mg, Oral, Daily   Melatonin 5 mg, Oral, Daily at bedtime   nitroGLYCERIN (NITROSTAT) 0.4 MG SL tablet PLACE 1 TABLET UNDER THE TONGUE EVERY 5 MINUTES AS NEEDED FOR CHEST PAIN.   Olopatadine HCl (PATADAY OP) 1 drop, Daily PRN   promethazine (PHENERGAN) 25  MG suppository No dose, route, or frequency recorded.   rosuvastatin (CRESTOR) 20 MG tablet TAKE 1 TABLET BY MOUTH EVERY DAY   tiZANidine (ZANAFLEX) 2 mg, Oral, Daily at bedtime   traMADol (ULTRAM) 50 MG tablet Oral, Every 12 hours PRN   traZODone (DESYREL) 100 mg, Oral, Daily at bedtime    Diet Orders (From admission, onward)     Start     Ordered   10/18/22 1826  Diet regular Room service appropriate? Yes; Fluid consistency: Thin  Diet effective now       Question Answer Comment  Room service appropriate? Yes   Fluid consistency: Thin      10/18/22 1825            DVT prophylaxis: SCDs Start: 10/18/22 1826 SCDs Start: 10/17/22 1415   Lab Results  Component Value Date   PLT 161 10/21/2022      Code Status: Full Code  Family Communication: no family at bedside   Status is: Inpatient   Level of care: Med-Surg  Consultants:  Orthopedic surgery   Objective: Vitals:   10/20/22 1510 10/20/22 2153 10/21/22 2142 10/22/22 0749  BP: 133/65 117/77 132/75 134/69  Pulse: 88 (!) 111 91 86  Resp: '17 19 18 19  '$ Temp: 98 F (36.7 C) 98.2 F (36.8 C) 100 F (37.8 C) 99.5 F (37.5 C)  TempSrc: Oral Oral Oral Oral  SpO2: 98% 95% 93% 90%  Weight:      Height:       No intake or output data in the 24 hours ending 10/22/22 1046  Wt Readings from Last 3 Encounters:  10/18/22 72.6 kg  07/24/22 73.9 kg  07/23/22 73.7 kg    Examination:  Constitutional: NAD Eyes: lids and conjunctivae normal, no scleral icterus ENMT: mmm Neck: normal, supple Respiratory: clear to auscultation bilaterally, no wheezing, no crackles. Normal respiratory effort.  Cardiovascular: Regular rate and rhythm, no murmurs / rubs / gallops. No LE edema. Abdomen: soft, no distention, no tenderness. Bowel sounds positive.   Data Reviewed: I have independently reviewed following labs and imaging studies   CBC Recent Labs  Lab 10/17/22 1148 10/18/22 0404 10/19/22 0526 10/20/22 0412  10/21/22 0313  WBC 10.4 8.6 9.8 11.9* 9.6  HGB 11.9* 10.0* 9.5* 8.5* 7.9*  HCT 37.3 31.0* 28.6* 26.3* 25.6*  PLT 230 177 187 169 161  MCV 92.1 90.6 89.4 90.7 93.4  MCH 29.4 29.2 29.7 29.3 28.8  MCHC 31.9 32.3 33.2 32.3 30.9  RDW 13.3 13.2 13.2 13.2 13.2     Recent Labs  Lab 10/17/22 1148 10/18/22 0404 10/19/22 0526 10/20/22 0412 10/21/22 0313  NA 135 139 133* 138 137  K 3.3* 3.5 4.0 4.3 3.4*  CL 101 105 101 109 107  CO2 '25 24 22 '$ 16* 23  GLUCOSE 117* 95 159* 124* 97  BUN 18 18 31* 39* 29*  CREATININE 1.90* 1.87* 2.06* 2.26* 1.92*  CALCIUM 9.5 8.9 8.3* 8.3* 8.5*  AST 19  --   --   --   --   ALT 15  --   --   --   --   [facility]  66  --   --   --   --   BILITOT 0.3  --   --   --   --   ALBUMIN 3.6  --   --   --   --   MG 2.2  --  2.2 QUANTITY NOT SUFFICIENT, UNABLE TO PERFORM TEST 2.4  INR 1.0  --   --   --   --      ------------------------------------------------------------------------------------------------------------------ No results for input(s): "CHOL", "HDL", "LDLCALC", "TRIG", "CHOLHDL", "LDLDIRECT" in the last 72 hours.  Lab Results  Component Value Date   HGBA1C 5.6 08/07/2013   ------------------------------------------------------------------------------------------------------------------ No results for input(s): "TSH", "T4TOTAL", "T3FREE", "THYROIDAB" in the last 72 hours.  Invalid input(s): "FREET3"  Cardiac Enzymes No results for input(s): "CKMB", "TROPONINI", "MYOGLOBIN" in the last 168 hours.  Invalid input(s): "CK" ------------------------------------------------------------------------------------------------------------------ No results found for: "BNP"  CBG: No results for input(s): "GLUCAP" in the last 168 hours.  Recent Results (from the past 240 hour(s))  Surgical pcr screen     Status: None   Collection Time: 10/18/22  5:37 AM   Specimen: Nasal Mucosa; Nasal Swab  Result Value Ref Range Status   MRSA, PCR NEGATIVE NEGATIVE Final    Staphylococcus aureus NEGATIVE NEGATIVE Final    Comment: (NOTE) The Xpert SA Assay (FDA approved for NASAL specimens in patients 17 years of age and older), is one component of a comprehensive surveillance program. It is not intended to diagnose infection nor to guide or monitor treatment. Performed at [facility], [place] [address] [place], [place] [address]      Radiology Studies: No results found.   Marzetta Board, MD, PhD [facility]  Between 7 am - 7 pm I am available, please contact me via Amion (for emergencies) or Securechat (non urgent messages)  Between 7 pm - 7 am I am not available, please contact night coverage MD/APP via Amion

## 2022-10-23 DIAGNOSIS — E785 Hyperlipidemia, unspecified: Secondary | ICD-10-CM | POA: Diagnosis not present

## 2022-10-23 DIAGNOSIS — E1169 Type 2 diabetes mellitus with other specified complication: Secondary | ICD-10-CM | POA: Diagnosis not present

## 2022-10-23 DIAGNOSIS — S72145A Nondisplaced intertrochanteric fracture of left femur, initial encounter for closed fracture: Secondary | ICD-10-CM | POA: Diagnosis not present

## 2022-10-23 DIAGNOSIS — E669 Obesity, unspecified: Secondary | ICD-10-CM | POA: Diagnosis not present

## 2022-10-23 LAB — CBC
HCT: 24.1 % — ABNORMAL LOW (ref 36.0–46.0)
Hemoglobin: 7.9 g/dL — ABNORMAL LOW (ref 12.0–15.0)
MCH: 30.2 pg (ref 26.0–34.0)
MCHC: 32.8 g/dL (ref 30.0–36.0)
MCV: 92 fL (ref 80.0–100.0)
Platelets: 188 10*3/uL (ref 150–400)
RBC: 2.62 MIL/uL — ABNORMAL LOW (ref 3.87–5.11)
RDW: 13.2 % (ref 11.5–15.5)
WBC: 11.8 10*3/uL — ABNORMAL HIGH (ref 4.0–10.5)
nRBC: 0 % (ref 0.0–0.2)

## 2022-10-23 LAB — COMPREHENSIVE METABOLIC PANEL
ALT: 5 U/L (ref 0–44)
AST: 14 U/L — ABNORMAL LOW (ref 15–41)
Albumin: 2.5 g/dL — ABNORMAL LOW (ref 3.5–5.0)
Alkaline Phosphatase: 49 U/L (ref 38–126)
Anion gap: 9 (ref 5–15)
BUN: 29 mg/dL — ABNORMAL HIGH (ref 8–23)
CO2: 24 mmol/L (ref 22–32)
Calcium: 8.5 mg/dL — ABNORMAL LOW (ref 8.9–10.3)
Chloride: 105 mmol/L (ref 98–111)
Creatinine, Ser: 1.71 mg/dL — ABNORMAL HIGH (ref 0.44–1.00)
GFR, Estimated: 32 mL/min — ABNORMAL LOW (ref >60)
Glucose, Bld: 110 mg/dL — ABNORMAL HIGH (ref 70–99)
Potassium: 4.1 mmol/L (ref 3.5–5.1)
Sodium: 138 mmol/L (ref 135–145)
Total Bilirubin: 0.4 mg/dL (ref 0.3–1.2)
Total Protein: 5.4 g/dL — ABNORMAL LOW (ref 6.5–8.1)

## 2022-10-23 LAB — MAGNESIUM: Magnesium: 2.3 mg/dL (ref 1.7–2.4)

## 2022-10-23 NOTE — Progress Notes (Signed)
Inpatient Rehab Admissions Coordinator:    Peer to peer requested for this AM, but anticipate a denial. I will file expedited appeal if denial is rendered.   Clemens Catholic, [place], [place] Admissions Coordinator  [phone] ([place]) [phone] (office)

## 2022-10-23 NOTE — Progress Notes (Signed)
PROGRESS NOTE  Samantha Mcbride VHQ:[id number] DOB: 31-Jan-1951 DOA: 10/17/2022 PCP: Antony Contras, MD   LOS: 5 days   Brief Narrative / Interim history: 72 year old female with history of CAD status post STEMI and DES in 2012, peri-infarct V. tach, COPD, CKD 4, RA, HLD, HTN, breast cancer in remission comes in after a ground-level fall.  Her daughter's dog ran into her and she fell backwards onto her left hip.  Imaging on admission showed left intertrochanteric femur fracture.  Orthopedics consulted, and she was admitted to the hospital, status post operative repair on 1/4  Subjective / 24h Interval events: No events overnight.  She is doing well this morning  Assesement and Plan: Principal Problem:   Intertrochanteric fracture of left hip (HCC) Active Problems:   Hyperlipidemia associated with type 2 diabetes mellitus (HCC)   Obesity (BMI 30-39.9)   H/O Anterior STEMI:  Proximal LAD  insertion of a 3.5x24 mm Promus element DES (08/2011)   CAD S/P percutaneous coronary angioplasty   Essential hypertension   Malignant neoplasm of upper-outer quadrant of right breast in female, estrogen receptor positive (HCC)   Chronic kidney disease, stage 4 (severe) (HCC)   Rheumatoid arthritis (HCC)   COPD (chronic obstructive pulmonary disease) with emphysema ([place]) noted on CT   Hip fracture (HCC)   Principal problem Left intertrochanteric hip fracture -appreciate orthopedic surgery follow-up, status post IM nail 10/18/2022 by Dr. Marcelino Scot.  Continue to mobilize with PT, CIR has been recommended but insurance denied.  Expedited appeal is in process  Active problems CKD 4 -her creatinine is overall stable.  1.7 this morning.  Hypokalemia-recheck tomorrow  Acute blood loss anemia-postoperatively, hemoglobin dipped to 7.9 and has remained stable.  Monitor, no need for transfusions.  Monitor CBC periodically  RA -continue hydroxychloroquine, leflunomide  CAD -no chest pain, this has been stable since  her STEMI 11 years ago.  Seeing Dr. Ellyn Hack as an outpatient on a regular basis.  No red flag symptoms, no chest pains, no dyspnea on exertion, no lower extremity swelling  HLD - continue statin, Zetia  HTN -continue carvedilol  History of breast cancer -outpatient follow-up  Scheduled Meds:  carvedilol  12.5 mg Oral BID   clopidogrel  75 mg Oral Daily   docusate sodium  100 mg Oral BID   ezetimibe  10 mg Oral Daily   gabapentin  300 mg Oral QHS   hydroxychloroquine  200 mg Oral Daily   leflunomide  20 mg Oral Daily   melatonin  5 mg Oral QHS   rosuvastatin  20 mg Oral Daily   Continuous Infusions:  PRN Meds:.HYDROcodone-acetaminophen, HYDROmorphone (DILAUDID) injection, menthol-cetylpyridinium **OR** phenol, metoCLOPramide **OR** metoCLOPramide (REGLAN) injection, ondansetron **OR** ondansetron (ZOFRAN) IV, traZODone  Current Outpatient Medications  Medication Instructions   acetaminophen (TYLENOL) 500 mg, Oral, Every 6 hours PRN   amoxicillin (AMOXIL) 500 mg, Oral, 2 times daily   Calcium Citrate-Vitamin D (CALCIUM + D PO) 1 tablet, Oral, Daily   carvedilol (COREG) 12.5 mg, Oral, 2 times daily   clopidogrel (PLAVIX) 75 MG tablet TAKE 1 TABLET BY MOUTH EVERY DAY   COVID-19 mRNA bivalent vaccine, Pfizer, (PFIZER COVID-19 VAC BIVALENT) injection Intramuscular   COVID-19 mRNA vaccine, Pfizer, 30 MCG/0.3ML injection Intramuscular   diclofenac sodium (VOLTAREN) 2 g, Topical, 4 times daily PRN   DM-APAP-CPM (CORICIDIN HBP PO) 1 tablet, Oral, Daily PRN   ezetimibe (ZETIA) 10 MG tablet TAKE 1 TABLET BY MOUTH EVERY DAY   gabapentin (NEURONTIN) 100 MG capsule TAKE 3  CAPSULES BY MOUTH AT BEDTIME.   hydroxychloroquine (PLAQUENIL) 200 mg, Oral, Daily   leflunomide (ARAVA) 20 mg, Oral, Daily   Melatonin 5 mg, Oral, Daily at bedtime   nitroGLYCERIN (NITROSTAT) 0.4 MG SL tablet PLACE 1 TABLET UNDER THE TONGUE EVERY 5 MINUTES AS NEEDED FOR CHEST PAIN.   Olopatadine HCl (PATADAY OP) 1 drop,  Daily PRN   promethazine (PHENERGAN) 25 MG suppository No dose, route, or frequency recorded.   rosuvastatin (CRESTOR) 20 MG tablet TAKE 1 TABLET BY MOUTH EVERY DAY   tiZANidine (ZANAFLEX) 2 mg, Oral, Daily at bedtime   traMADol (ULTRAM) 50 MG tablet Oral, Every 12 hours PRN   traZODone (DESYREL) 100 mg, Oral, Daily at bedtime    Diet Orders (From admission, onward)     Start     Ordered   10/18/22 1826  Diet regular Room service appropriate? Yes; Fluid consistency: Thin  Diet effective now       Question Answer Comment  Room service appropriate? Yes   Fluid consistency: Thin      10/18/22 1825            DVT prophylaxis: SCDs Start: 10/18/22 1826 SCDs Start: 10/17/22 1415   Lab Results  Component Value Date   PLT 188 10/23/2022      Code Status: Full Code  Family Communication: no family at bedside   Status is: Inpatient   Level of care: Med-Surg  Consultants:  Orthopedic surgery   Objective: Vitals:   10/22/22 1307 10/22/22 2000 10/23/22 0610 10/23/22 0757  BP: 113/64 135/77 138/83 (!) 145/76  Pulse: 93   82  Resp: '13 20 20 18  '$ Temp: 99.1 F (37.3 C) 98.7 F (37.1 C) 98.7 F (37.1 C) 99 F (37.2 C)  TempSrc: Oral Oral Oral Oral  SpO2: 91% 95% 91% 92%  Weight:      Height:       No intake or output data in the 24 hours ending 10/23/22 1135  Wt Readings from Last 3 Encounters:  10/18/22 72.6 kg  07/24/22 73.9 kg  07/23/22 73.7 kg    Examination:  Constitutional: NAD Eyes: lids and conjunctivae normal, no scleral icterus ENMT: mmm Neck: normal, supple Respiratory: clear to auscultation bilaterally, no wheezing, no crackles. Normal respiratory effort.  Cardiovascular: Regular rate and rhythm, no murmurs / rubs / gallops. No LE edema. Abdomen: soft, no distention, no tenderness. Bowel sounds positive.  Skin: no rashes Neurologic: no focal deficits, equal strength  Data Reviewed: I have independently reviewed following labs and imaging  studies   CBC Recent Labs  Lab 10/18/22 0404 10/19/22 0526 10/20/22 0412 10/21/22 0313 10/23/22 0401  WBC 8.6 9.8 11.9* 9.6 11.8*  HGB 10.0* 9.5* 8.5* 7.9* 7.9*  HCT 31.0* 28.6* 26.3* 25.6* 24.1*  PLT 177 187 169 161 188  MCV 90.6 89.4 90.7 93.4 92.0  MCH 29.2 29.7 29.3 28.8 30.2  MCHC 32.3 33.2 32.3 30.9 32.8  RDW 13.2 13.2 13.2 13.2 13.2     Recent Labs  Lab 10/17/22 1148 10/18/22 0404 10/19/22 0526 10/20/22 0412 10/21/22 0313 10/23/22 0401  NA 135 139 133* 138 137 138  K 3.3* 3.5 4.0 4.3 3.4* 4.1  CL 101 105 101 109 107 105  CO2 '25 24 22 '$ 16* 23 24  GLUCOSE 117* 95 159* 124* 97 110*  BUN 18 18 31* 39* 29* 29*  CREATININE 1.90* 1.87* 2.06* 2.26* 1.92* 1.71*  CALCIUM 9.5 8.9 8.3* 8.3* 8.5* 8.5*  AST 19  --   --   --   --  14*  ALT 15  --   --   --   --  5  ALKPHOS 66  --   --   --   --  49  BILITOT 0.3  --   --   --   --  0.4  ALBUMIN 3.6  --   --   --   --  2.5*  MG 2.2  --  2.2 QUANTITY NOT SUFFICIENT, UNABLE TO PERFORM TEST 2.4 2.3  INR 1.0  --   --   --   --   --      ------------------------------------------------------------------------------------------------------------------ No results for input(s): "CHOL", "HDL", "LDLCALC", "TRIG", "CHOLHDL", "LDLDIRECT" in the last 72 hours.  Lab Results  Component Value Date   HGBA1C 5.6 08/07/2013   ------------------------------------------------------------------------------------------------------------------ No results for input(s): "TSH", "T4TOTAL", "T3FREE", "THYROIDAB" in the last 72 hours.  Invalid input(s): "FREET3"  Cardiac Enzymes No results for input(s): "CKMB", "TROPONINI", "MYOGLOBIN" in the last 168 hours.  Invalid input(s): "CK" ------------------------------------------------------------------------------------------------------------------ No results found for: "BNP"  CBG: No results for input(s): "GLUCAP" in the last 168 hours.  Recent Results (from the past 240 hour(s))  Surgical  pcr screen     Status: None   Collection Time: 10/18/22  5:37 AM   Specimen: Nasal Mucosa; Nasal Swab  Result Value Ref Range Status   MRSA, PCR NEGATIVE NEGATIVE Final   Staphylococcus aureus NEGATIVE NEGATIVE Final    Comment: (NOTE) The Xpert SA Assay (FDA approved for NASAL specimens in patients 82 years of age and older), is one component of a comprehensive surveillance program. It is not intended to diagnose infection nor to guide or monitor treatment. Performed at [facility], [place] [address] [place], [place] [address]      Radiology Studies: No results found.   Marzetta Board, MD, PhD [facility]  Between 7 am - 7 pm I am available, please contact me via Amion (for emergencies) or Securechat (non urgent messages)  Between 7 pm - 7 am I am not available, please contact night coverage MD/APP via Amion

## 2022-10-23 NOTE — Plan of Care (Signed)
  Problem: Health Behavior/Discharge Planning: Goal: Ability to manage health-related needs will improve Outcome: Progressing   Problem: Clinical Measurements: Goal: Will remain free from infection Outcome: Progressing Goal: Diagnostic test results will improve Outcome: Progressing   Problem: Activity: Goal: Risk for activity intolerance will decrease Outcome: Progressing   Problem: Nutrition: Goal: Adequate nutrition will be maintained Outcome: Progressing   Problem: Elimination: Goal: Will not experience complications related to bowel motility Outcome: Progressing Goal: Will not experience complications related to urinary retention Outcome: Progressing   Problem: Pain Managment: Goal: General experience of comfort will improve Outcome: Progressing   Problem: Safety: Goal: Ability to remain free from injury will improve Outcome: Progressing

## 2022-10-23 NOTE — Progress Notes (Signed)
Occupational Therapy Treatment Patient Details Name: Samantha Mcbride MRN: [id number] DOB: 1951/07/21 Today's Date: 10/23/2022   History of present illness Pt is a 72 yo F admitted 1/3 following a mechanical fall that resulted in a L intertrochanteric femur fx. She is now s/p IMN and WBAT LLE. PMH significant for HLD, CAD, HTN, breast cx, rheumatoid arthritis, and COPD   OT comments  Pt in bed upon therapy arrival and agreeable to participate in OT treatment. Session focused on bed mobility, functional mobility, ADL re-training, and functional transfers. Pt demonstrated progress towards goals this session. She is able to perform sit to stands with less physical assistance. Continues to have difficulty standing from lower surfaces. Standing tolerance has improved as pt was able to stand and brush her teeth at sink. Based on assistance pt requires at this time, CIR will still be beneficial. Acute OT will continue to follow.     Recommendations for follow up therapy are one component of a multi-disciplinary discharge planning process, led by the attending physician.  Recommendations may be updated based on patient status, additional functional criteria and insurance authorization.    Follow Up Recommendations  Acute inpatient rehab (3hours/day)     Assistance Recommended at Discharge Intermittent Supervision/Assistance  Patient can return home with the following  Assistance with cooking/housework;A little help with walking and/or transfers;A little help with bathing/dressing/bathroom;Help with stairs or ramp for entrance   Equipment Recommendations  Other (comment) (TBD)    Recommendations for Other Services Rehab consult    Precautions / Restrictions Precautions Precautions: Fall Precaution Comments: LUE bursitis drained 10/17/22 Restrictions Weight Bearing Restrictions: Yes LUE Weight Bearing: Weight bearing as tolerated LLE Weight Bearing: Weight bearing as tolerated Other  Position/Activity Restrictions: Per Dr. Annie Main, pt can weight bear through L wrist & forearm, but try to limit weight bearing through L elbow.       Mobility Bed Mobility Overal bed mobility: Needs Assistance Bed Mobility: Sidelying to Sit   Sidelying to sit: Mod assist, HOB elevated       General bed mobility comments: More physical assist provided to transition from sidelying to sit to decrease amount of weightbearing on Left elbow    Transfers Overall transfer level: Needs assistance Equipment used: Rolling walker (2 wheels) Transfers: Sit to/from Stand, Bed to chair/wheelchair/BSC Sit to Stand: Min guard     Step pivot transfers: Min assist           Balance Overall balance assessment: Needs assistance Sitting-balance support: No upper extremity supported Sitting balance-Leahy Scale: Good     Standing balance support: Single extremity supported, During functional activity, Bilateral upper extremity supported Standing balance-Leahy Scale: Fair               ADL either performed or assessed with clinical judgement   ADL Overall ADL's : Needs assistance/impaired     Grooming: Oral care;Wash/dry hands;Supervision/safety;Standing Grooming Details (indicate cue type and reason): at sink with RW     Lower Body Dressing: Minimal assistance;Sitting/lateral leans Lower Body Dressing Details (indicate cue type and reason): Pt able to don sock on RLE only. Assist needed for LLE. Toilet Transfer: Minimal assistance;Ambulation;Regular Toilet;Grab bars;Rolling walker (2 wheels)   Toileting- Clothing Manipulation and Hygiene: Min guard;Sit to/from stand              Extremity/Trunk Assessment                        Cognition   Behavior During Therapy:  WFL for tasks assessed/performed Overall Cognitive Status: Within Functional Limits for tasks assessed                      General Comments Supplemental oxygen removed during session to assess  O2 sats. Pt flucuated between 89-90%. Oxygen replaced at end of session. HR remained WNL during functional mobility.    Pertinent Vitals/ Pain       Pain Assessment Pain Assessment: No/denies pain Pain Score: 0-No pain         Frequency  Min 3X/week        Progress Toward Goals  OT Goals(current goals can now be found in the care plan section)  Progress towards OT goals: Progressing toward goals     Plan Discharge plan remains appropriate;Frequency remains appropriate       AM-PAC OT "6 Clicks" Daily Activity     Outcome Measure   Help from another person eating meals?: None Help from another person taking care of personal grooming?: None Help from another person toileting, which includes using toliet, bedpan, or urinal?: A Little Help from another person bathing (including washing, rinsing, drying)?: A Lot Help from another person to put on and taking off regular upper body clothing?: A Little Help from another person to put on and taking off regular lower body clothing?: A Lot 6 Click Score: 18    End of Session Equipment Utilized During Treatment: Oxygen;Rolling walker (2 wheels);Gait belt  OT Visit Diagnosis: Unsteadiness on feet (R26.81);Muscle weakness (generalized) (M62.81)   Activity Tolerance Patient tolerated treatment well   Patient Left in chair;with call bell/phone within reach;with nursing/sitter in room           Time: 0900-0926 OT Time Calculation (min): 26 min  Charges: OT General Charges $OT Visit: 1 Visit OT Treatments $Self Care/Home Management : 23-37 mins  Ailene Ravel, OTR/L,CBIS  Supplemental OT - [facility] and [facility] Secure Chat Preferred    Prestina Raigoza, Clarene Duke 10/23/2022, 9:36 AM

## 2022-10-23 NOTE — Progress Notes (Signed)
Mobility Specialist Progress Note   10/23/22 1133  Mobility  Activity Transferred from chair to bed  Level of Assistance Minimal assist, patient does 75% or more  Assistive Device Front wheel walker  Distance Ambulated (ft) 4 ft  LUE Weight Bearing WBAT  LLE Weight Bearing WBAT  Activity Response Tolerated well  Mobility Referral Yes  $Mobility charge 1 Mobility   Pre Mobility:95 HR, 92% SpO2 on RA During Mobility: 103 HR, 94% SpO2 on RA  Post Mobility: 89 HR, 91% SpO2 on RA  Pt requesting to get back to bed d/t tiredness, pain (6/10) present in left hip as well. PT able to stand w/ MinG and step pivot  to EOB w/ increased time. MinA to help get LLE back in bed. No faults or further complaint on transfer. Call bell left in reach w/ bed alarm on.   Holland Falling Mobility Specialist Please contact via SecureChat or  Rehab office at [phone]

## 2022-10-23 NOTE — Progress Notes (Signed)
Mobility Specialist Progress Note   10/23/22 1733  Mobility  Activity Transferred from bed to chair  Level of Assistance Contact guard assist, steadying assist  Assistive Device Front wheel walker  Distance Ambulated (ft) 4 ft  LUE Weight Bearing WBAT  LLE Weight Bearing WBAT  Activity Response Tolerated well  Mobility Referral Yes  $Mobility charge 1 Mobility   Pt having no complaints and agreeable to eating dinner while sitting in chair. MinA to get EOB using UE support. CGA to ambulate to chair and sit. Left in chair w/ needs met, call bell in reach.   Holland Falling Mobility Specialist Please contact via SecureChat or  Rehab office at [phone]

## 2022-10-23 NOTE — Progress Notes (Signed)
Physical Therapy Treatment Patient Details Name: Samantha Mcbride MRN: [id number] DOB: May 02, 1951 Today's Date: 10/23/2022   History of Present Illness Pt is a 72 yo F admitted 1/3 following a mechanical fall that resulted in a L intertrochanteric femur fx. She is now s/p IMN and WBAT LLE. PMH significant for HLD, CAD, HTN, breast cx, rheumatoid arthritis, and COPD.    PT Comments    Pt received in supine, agreeable to therapy session with encouragement, emphasis on safety with bed mobility and transfers, to avoid WB through L elbow per precs, activity pacing and O2 monitoring. Pt able to standing with single UE support for static self-care tasks but reliant on RW for support with dynamic standing tasks, SpO2 88-94% on RA with exertional tasks, when reading dropped below 88% noisy signal and improved once pt relaxed her hand with sensor, RN notified. Pt also reports her SCD machine is malfunctioning, RN notified. Pt continues to benefit from PT services to progress toward functional mobility goals.    Recommendations for follow up therapy are one component of a multi-disciplinary discharge planning process, led by the attending physician.  Recommendations may be updated based on patient status, additional functional criteria and insurance authorization.  Follow Up Recommendations  Acute inpatient rehab (3hours/day) Can patient physically be transported by private vehicle: Yes   Assistance Recommended at Discharge Frequent or constant Supervision/Assistance  Patient can return home with the following A lot of help with walking and/or transfers;Assist for transportation;Assistance with cooking/housework;Help with stairs or ramp for entrance   Equipment Recommendations  None recommended by PT (TBD, may have supplemental O2 needs pending progress)    Recommendations for Other Services       Precautions / Restrictions Precautions Precautions: Fall Precaution Comments: LUE bursitis drained  10/17/22 Restrictions Weight Bearing Restrictions: Yes LUE Weight Bearing: Weight bearing as tolerated LLE Weight Bearing: Weight bearing as tolerated Other Position/Activity Restrictions: Per Dr. Annie Main, pt can weight bear through L wrist & forearm, but try to limit weight bearing through L elbow.     Mobility  Bed Mobility Overal bed mobility: Needs Assistance Bed Mobility: Sidelying to Sit   Sidelying to sit: Mod assist, HOB elevated       General bed mobility comments: More physical assist provided to transition from sidelying to sit to decrease amount of weightbearing on Left elbow, transitioning up to L edge of bed; pt needs tactile cues/ manual assist to prevent WB through L elbow.    Transfers Overall transfer level: Needs assistance Equipment used: Rolling walker (2 wheels) Transfers: Sit to/from Stand, Bed to chair/wheelchair/BSC Sit to Stand: Min guard           General transfer comment: from EOB and toilet heights<>RW, cues for safe UE placement    Ambulation/Gait Ambulation/Gait assistance: Min guard Gait Distance (Feet): 35 Feet (6f, seated break, then 348f Assistive device: Rolling walker (2 wheels) Gait Pattern/deviations: Step-through pattern, Decreased step length - right, Decreased step length - left, Decreased stance time - left, Antalgic Gait velocity: Decreased cadence Gait velocity interpretation: <1.8 ft/sec, indicate of risk for recurrent falls   General Gait Details: cues for step sequencing with RW for decreased pain, no overt LOB, pt fatigues after short household distance; O2 on RA at one point reading 86% however noisy signal, when pt relaxed her hand and took standing break, reading 90% and greater on RA.   Stairs             WhEmergency planning/management officer  Modified Rankin (Stroke Patients Only)       Balance Overall balance assessment: Needs assistance Sitting-balance support: No upper extremity supported Sitting balance-Leahy  Scale: Good     Standing balance support: Single extremity supported, During functional activity, Bilateral upper extremity supported Standing balance-Leahy Scale: Fair Standing balance comment: pt able to perform her own peri-care standing in bathroom with single UE support of RW                            Cognition Arousal/Alertness: Awake/alert Behavior During Therapy: WFL for tasks assessed/performed Overall Cognitive Status: Within Functional Limits for tasks assessed                                          Exercises General Exercises - Lower Extremity Ankle Circles/Pumps: AROM, Both, 10 reps, Supine Heel Slides: AAROM, Strengthening, Left, AROM, 5 reps Hip ABduction/ADduction: AROM, AAROM, Left, 5 reps, Supine Other Exercises Other Exercises: IS x 5 reps, encouraged pt to perform x10 reps hourly, pt achieves ~750 mL    General Comments General comments (skin integrity, edema, etc.): Supplemental oxygen removed during session to assess O2 sats. Pt flucuated between 88-92% but noisy signal appearance with pt WB through hand with sensor. Oxygen WFL on RA at end of session when pt not actively using her hand with sensor on it. HR remained WNL during functional mobility.      Pertinent Vitals/Pain Pain Assessment Pain Assessment: 0-10 Pain Score: 5  Pain Location: LLE with movement Pain Descriptors / Indicators: Discomfort, Grimacing Pain Intervention(s): Monitored during session, Repositioned, Ice applied    Home Living                          Prior Function            PT Goals (current goals can now be found in the care plan section) Acute Rehab PT Goals Patient Stated Goal: decreased pain, get better PT Goal Formulation: With patient Time For Goal Achievement: 11/02/22 Progress towards PT goals: Progressing toward goals    Frequency    Min 5X/week      PT Plan Current plan remains appropriate       AM-PAC PT "6  Clicks" Mobility   Outcome Measure  Help needed turning from your back to your side while in a flat bed without using bedrails?: A Little Help needed moving from lying on your back to sitting on the side of a flat bed without using bedrails?: A Lot Help needed moving to and from a bed to a chair (including a wheelchair)?: A Little Help needed standing up from a chair using your arms (e.g., wheelchair or bedside chair)?: A Little Help needed to walk in hospital room?: A Lot (mod cues for safety/PLB) Help needed climbing 3-5 steps with a railing? : A Lot 6 Click Score: 15    End of Session Equipment Utilized During Treatment: Gait belt (pt doffed 2L O2 [place] prior to OOB and reports it has been on intermittently, noisy signal) Activity Tolerance: Patient tolerated treatment well;Patient limited by fatigue Patient left: in bed;with call bell/phone within reach;with bed alarm set;Other (comment) (pt reports SCD machine not working, so did not attempt to apply them; pt also states she was in chair earlier so wanting to return to supine) Nurse Communication:  Mobility status;Other (comment);Precautions (pt requesting fewer lines and O2 reading WFL at end of session so kept [place] off, RN notified in case it drops again while she is resting) PT Visit Diagnosis: Muscle weakness (generalized) (M62.81);Pain;Difficulty in walking, not elsewhere classified (R26.2);Unsteadiness on feet (R26.81) Pain - Right/Left: Left Pain - part of body: Hip     Time: 9381-0175 PT Time Calculation (min) (ACUTE ONLY): 25 min  Charges:  $Gait Training: 8-22 mins $Therapeutic Activity: 8-22 mins                     Zebedee Segundo P., PTA Acute Rehabilitation Services Secure Chat Preferred 9a-5:30pm Office: [place] 10/23/2022, 4:31 PM

## 2022-10-24 DIAGNOSIS — W19XXXA Unspecified fall, initial encounter: Secondary | ICD-10-CM

## 2022-10-24 DIAGNOSIS — S72002A Fracture of unspecified part of neck of left femur, initial encounter for closed fracture: Secondary | ICD-10-CM | POA: Diagnosis not present

## 2022-10-24 LAB — CBC
HCT: 24.5 % — ABNORMAL LOW (ref 36.0–46.0)
Hemoglobin: 7.8 g/dL — ABNORMAL LOW (ref 12.0–15.0)
MCH: 29.7 pg (ref 26.0–34.0)
MCHC: 31.8 g/dL (ref 30.0–36.0)
MCV: 93.2 fL (ref 80.0–100.0)
Platelets: 211 10*3/uL (ref 150–400)
RBC: 2.63 MIL/uL — ABNORMAL LOW (ref 3.87–5.11)
RDW: 13.2 % (ref 11.5–15.5)
WBC: 10.1 10*3/uL (ref 4.0–10.5)
nRBC: 0 % (ref 0.0–0.2)

## 2022-10-24 LAB — COMPREHENSIVE METABOLIC PANEL
ALT: 6 U/L (ref 0–44)
AST: 14 U/L — ABNORMAL LOW (ref 15–41)
Albumin: 2.5 g/dL — ABNORMAL LOW (ref 3.5–5.0)
Alkaline Phosphatase: 57 U/L (ref 38–126)
Anion gap: 7 (ref 5–15)
BUN: 27 mg/dL — ABNORMAL HIGH (ref 8–23)
CO2: 25 mmol/L (ref 22–32)
Calcium: 8.6 mg/dL — ABNORMAL LOW (ref 8.9–10.3)
Chloride: 105 mmol/L (ref 98–111)
Creatinine, Ser: 1.7 mg/dL — ABNORMAL HIGH (ref 0.44–1.00)
GFR, Estimated: 32 mL/min — ABNORMAL LOW (ref >60)
Glucose, Bld: 106 mg/dL — ABNORMAL HIGH (ref 70–99)
Potassium: 4 mmol/L (ref 3.5–5.1)
Sodium: 137 mmol/L (ref 135–145)
Total Bilirubin: 0.4 mg/dL (ref 0.3–1.2)
Total Protein: 5.5 g/dL — ABNORMAL LOW (ref 6.5–8.1)

## 2022-10-24 NOTE — Progress Notes (Signed)
PROGRESS NOTE  Samantha Mcbride ZWC:[id number] DOB: 1951/09/30 DOA: 10/17/2022 PCP: Antony Contras, MD   LOS: 6 days   Brief Narrative / Interim history: 72 year old female with history of CAD status post STEMI and DES in 2012, peri-infarct V. tach, COPD, CKD 4, RA, HLD, HTN, breast cancer in remission comes in after a ground-level fall.  Her daughter's dog ran into her and she fell backwards onto her left hip.  Imaging on admission showed left intertrochanteric femur fracture.  Orthopedics consulted, and she was admitted to the hospital, status post operative repair on 1/4.  Plan for CIR when insurance auth complete.  Subjective / 24h Interval events: Awaiting insurance auth  Assesement and Plan: Principal Problem:   Intertrochanteric fracture of left hip (HCC) Active Problems:   Hyperlipidemia associated with type 2 diabetes mellitus (HCC)   Obesity (BMI 30-39.9)   H/O Anterior STEMI:  Proximal LAD  insertion of a 3.5x24 mm Promus element DES (08/2011)   CAD S/P percutaneous coronary angioplasty   Essential hypertension   Malignant neoplasm of upper-outer quadrant of right breast in female, estrogen receptor positive (HCC)   Chronic kidney disease, stage 4 (severe) (HCC)   Rheumatoid arthritis (HCC)   COPD (chronic obstructive pulmonary disease) with emphysema ([place]) noted on CT   Hip fracture (HCC)    Left intertrochanteric hip fracture -appreciate orthopedic surgery follow-up, status post IM nail 10/18/2022 by Dr. Marcelino Scot.  Continue to mobilize with PT, CIR has been recommended but insurance denied.  Expedited appeal is in process   CKD 4 -her creatinine is overall stable.  1.7 this morning.  Hypokalemia -repleted  Acute blood loss anemia-postoperatively, hemoglobin dipped to 7.8 and has remained stable.  Monitor, no need for transfusions.  Monitor CBC periodically  RA -continue hydroxychloroquine, leflunomide  CAD -no chest pain, this has been stable since her STEMI 11 years ago.   Seeing Dr. Ellyn Hack as an outpatient on a regular basis.  No red flag symptoms, no chest pains, no dyspnea on exertion, no lower extremity swelling  HLD - continue statin, Zetia  HTN -continue carvedilol  History of breast cancer -outpatient follow-up  Scheduled Meds:  carvedilol  12.5 mg Oral BID   clopidogrel  75 mg Oral Daily   docusate sodium  100 mg Oral BID   ezetimibe  10 mg Oral Daily   gabapentin  300 mg Oral QHS   hydroxychloroquine  200 mg Oral Daily   leflunomide  20 mg Oral Daily   melatonin  5 mg Oral QHS   rosuvastatin  20 mg Oral Daily   Continuous Infusions:  PRN Meds:.HYDROcodone-acetaminophen, HYDROmorphone (DILAUDID) injection, menthol-cetylpyridinium **OR** phenol, metoCLOPramide **OR** metoCLOPramide (REGLAN) injection, ondansetron **OR** ondansetron (ZOFRAN) IV, traZODone  Current Outpatient Medications  Medication Instructions   acetaminophen (TYLENOL) 500 mg, Oral, Every 6 hours PRN   amoxicillin (AMOXIL) 500 mg, Oral, 2 times daily   Calcium Citrate-Vitamin D (CALCIUM + D PO) 1 tablet, Oral, Daily   carvedilol (COREG) 12.5 mg, Oral, 2 times daily   clopidogrel (PLAVIX) 75 MG tablet TAKE 1 TABLET BY MOUTH EVERY DAY   COVID-19 mRNA bivalent vaccine, Pfizer, (PFIZER COVID-19 VAC BIVALENT) injection Intramuscular   COVID-19 mRNA vaccine, Pfizer, 30 MCG/0.3ML injection Intramuscular   diclofenac sodium (VOLTAREN) 2 g, Topical, 4 times daily PRN   DM-APAP-CPM (CORICIDIN HBP PO) 1 tablet, Oral, Daily PRN   ezetimibe (ZETIA) 10 MG tablet TAKE 1 TABLET BY MOUTH EVERY DAY   gabapentin (NEURONTIN) 100 MG capsule TAKE 3 CAPSULES  BY MOUTH AT BEDTIME.   hydroxychloroquine (PLAQUENIL) 200 mg, Oral, Daily   leflunomide (ARAVA) 20 mg, Oral, Daily   Melatonin 5 mg, Oral, Daily at bedtime   nitroGLYCERIN (NITROSTAT) 0.4 MG SL tablet PLACE 1 TABLET UNDER THE TONGUE EVERY 5 MINUTES AS NEEDED FOR CHEST PAIN.   Olopatadine HCl (PATADAY OP) 1 drop, Daily PRN   promethazine  (PHENERGAN) 25 MG suppository No dose, route, or frequency recorded.   rosuvastatin (CRESTOR) 20 MG tablet TAKE 1 TABLET BY MOUTH EVERY DAY   tiZANidine (ZANAFLEX) 2 mg, Oral, Daily at bedtime   traMADol (ULTRAM) 50 MG tablet Oral, Every 12 hours PRN   traZODone (DESYREL) 100 mg, Oral, Daily at bedtime    Diet Orders (From admission, onward)     Start     Ordered   10/18/22 1826  Diet regular Room service appropriate? Yes; Fluid consistency: Thin  Diet effective now       Question Answer Comment  Room service appropriate? Yes   Fluid consistency: Thin      10/18/22 1825            DVT prophylaxis: SCDs Start: 10/18/22 1826 SCDs Start: 10/17/22 1415   Lab Results  Component Value Date   PLT 211 10/24/2022      Code Status: Full Code  Family Communication: no family at bedside   Status is: Inpatient   Level of care: Med-Surg  Consultants:  Orthopedic surgery   Objective: Vitals:   10/23/22 1448 10/23/22 2004 10/24/22 0344 10/24/22 0922  BP:  (!) 146/76 (!) 147/74   Pulse: 88 93 86   Resp: (!) '25 18 15   '$ Temp:  98.4 F (36.9 C) 98.4 F (36.9 C) 97.8 F (36.6 C)  TempSrc:  Oral Oral Oral  SpO2: 93% 94% 93%   Weight:      Height:       No intake or output data in the 24 hours ending 10/24/22 1041  Wt Readings from Last 3 Encounters:  10/18/22 72.6 kg  07/24/22 73.9 kg  07/23/22 73.7 kg    Examination:   General: Appearance:    Obese female in no acute distress     Lungs:     respirations unlabored  Heart:    Normal heart rate.   MS:   All extremities are intact.   Neurologic:   Awake, alert, oriented x 3. No apparent focal neurological           defect.     Data Reviewed: I have independently reviewed following labs and imaging studies   CBC Recent Labs  Lab 10/19/22 0526 10/20/22 0412 10/21/22 0313 10/23/22 0401 10/24/22 0316  WBC 9.8 11.9* 9.6 11.8* 10.1  HGB 9.5* 8.5* 7.9* 7.9* 7.8*  HCT 28.6* 26.3* 25.6* 24.1* 24.5*  PLT 187  169 161 188 211  MCV 89.4 90.7 93.4 92.0 93.2  MCH 29.7 29.3 28.8 30.2 29.7  MCHC 33.2 32.3 30.9 32.8 31.8  RDW 13.2 13.2 13.2 13.2 13.2    Recent Labs  Lab 10/17/22 1148 10/18/22 0404 10/19/22 0526 10/20/22 0412 10/21/22 0313 10/23/22 0401 10/24/22 0316  NA 135   < > 133* 138 137 138 137  K 3.3*   < > 4.0 4.3 3.4* 4.1 4.0  CL 101   < > 101 109 107 105 105  CO2 25   < > 22 16* '23 24 25  '$ GLUCOSE 117*   < > 159* 124* 97 110* 106*  BUN 18   < >  31* 39* 29* 29* 27*  CREATININE 1.90*   < > 2.06* 2.26* 1.92* 1.71* 1.70*  CALCIUM 9.5   < > 8.3* 8.3* 8.5* 8.5* 8.6*  AST 19  --   --   --   --  14* 14*  ALT 15  --   --   --   --  5 6  ALKPHOS 66  --   --   --   --  49 57  BILITOT 0.3  --   --   --   --  0.4 0.4  ALBUMIN 3.6  --   --   --   --  2.5* 2.5*  MG 2.2  --  2.2 QUANTITY NOT SUFFICIENT, UNABLE TO PERFORM TEST 2.4 2.3  --   INR 1.0  --   --   --   --   --   --    < > = values in this interval not displayed.    ------------------------------------------------------------------------------------------------------------------ No results for input(s): "CHOL", "HDL", "LDLCALC", "TRIG", "CHOLHDL", "LDLDIRECT" in the last 72 hours.  Lab Results  Component Value Date   HGBA1C 5.6 08/07/2013   ------------------------------------------------------------------------------------------------------------------ No results for input(s): "TSH", "T4TOTAL", "T3FREE", "THYROIDAB" in the last 72 hours.  Invalid input(s): "FREET3"  Cardiac Enzymes No results for input(s): "CKMB", "TROPONINI", "MYOGLOBIN" in the last 168 hours.  Invalid input(s): "CK" ------------------------------------------------------------------------------------------------------------------ No results found for: "BNP"  CBG: No results for input(s): "GLUCAP" in the last 168 hours.  Recent Results (from the past 240 hour(s))  Surgical pcr screen     Status: None   Collection Time: 10/18/22  5:37 AM   Specimen:  Nasal Mucosa; Nasal Swab  Result Value Ref Range Status   MRSA, PCR NEGATIVE NEGATIVE Final   Staphylococcus aureus NEGATIVE NEGATIVE Final    Comment: (NOTE) The Xpert SA Assay (FDA approved for NASAL specimens in patients 61 years of age and older), is one component of a comprehensive surveillance program. It is not intended to diagnose infection nor to guide or monitor treatment. Performed at [facility], [place] [address] [place], [place] [address]      Radiology Studies: No results found.   Eulogio Bear DO [facility]  Between 7 am - 7 pm I am available, please contact me via Amion (for emergencies) or Securechat (non urgent messages)  Between 7 pm - 7 am I am not available, please contact night coverage MD/APP via Amion

## 2022-10-24 NOTE — Progress Notes (Signed)
No IV access on assessment, pt stated "they removed it today (10/23/22)" and stated that she'd rather "leave it alone for now, no need for it." On-call provider, J. Olena Heckle, NP made aware.

## 2022-10-24 NOTE — Progress Notes (Signed)
Inpatient Rehab Admissions Coordinator:   I continue to await auth for CIR. Expedited appeal filed yesterday. I called and confirmed that it is being processed. Pt. And daughter updated.  Clemens Catholic, [place], [place] Admissions Coordinator  [phone] ([place]) [phone] (office)

## 2022-10-24 NOTE — Progress Notes (Signed)
Physical Therapy Treatment Patient Details Name: Samantha Mcbride MRN: [id number] DOB: 1951-09-10 Today's Date: 10/24/2022   History of Present Illness Pt is a 72 yo F admitted 1/3 following a mechanical fall that resulted in a L intertrochanteric femur fx. She is now s/p IMN and WBAT LLE. PMH significant for HLD, CAD, HTN, breast cx, rheumatoid arthritis, and COPD.    PT Comments    Pt received in supine, agreeable to therapy session with encouragement, pt making good progress toward bed mobility and transfer/gait goals, but continues to require increased time/effort and encouragement to attempt and perform all tasks. Pt needing decreased assist to transition from supine to/from EOB toward her R side (to avoid pressure on L elbow), pt given gait belt to use as leg lifter but still needing minA to get to/from EOB<>supine. Pt stands with Supervision and needs mostly Supervision (up to min guard) for gait. Pt not dyspneic and poor O2 signal while pt holding RW for gait, SpO2 WFL on RA sitting EOB but desat to 86-87% on RA at rest after ~5 mins, RN notified and pt placed back on 1L O2 [place] at rest. Will continue to assess SpO2 with exertional tasks, pt may need supplemental O2 post-acute pending progress. If insurance denies CIR, pt reports she may be interested in working toward [facility] therapies rather than SNF, as she can have daughter stay with her during the day frequently. Pt has concerns about DME needs, updated below per discussion with pt and supervising PT Cathy H. Pt continues to benefit from PT services to progress toward functional mobility goals.    Recommendations for follow up therapy are one component of a multi-disciplinary discharge planning process, led by the attending physician.  Recommendations may be updated based on patient status, additional functional criteria and insurance authorization.  Follow Up Recommendations  Acute inpatient rehab (3hours/day) Can patient physically be  transported by private vehicle: Yes   Assistance Recommended at Discharge Frequent or constant Supervision/Assistance  Patient can return home with the following A lot of help with walking and/or transfers;Assist for transportation;Assistance with cooking/housework;Help with stairs or ramp for entrance   Equipment Recommendations  Rolling walker (2 wheels);BSC/3in1;Other (comment) (likely to need supplemental O2 at rest, will continue to assess with activity)    Recommendations for Other Services       Precautions / Restrictions Precautions Precautions: Fall Precaution Comments: LUE bursitis drained 10/17/22 Restrictions Weight Bearing Restrictions: Yes LUE Weight Bearing: Weight bearing as tolerated LLE Weight Bearing: Weight bearing as tolerated Other Position/Activity Restrictions: Per Dr. Annie Main, pt can weight bear through L wrist & forearm, but try to limit weight bearing through L elbow.     Mobility  Bed Mobility Overal bed mobility: Needs Assistance Bed Mobility: Sit to Supine, Supine to Sit     Supine to sit: Min assist, HOB elevated Sit to supine: Min assist (cues for use of gait belt for LLE assist but pt ultimately needing minA for LLE assist over edge of bed and using bed rails PRN)   General bed mobility comments: to R EOB, so that pt not tempted to use her L elbow when propping up. Pt reports no concern about L elbow. Pt c/o difficulty lifting LLE over into bed from R EOB, minA provided after pt attempted with gait belt but was unable to fully perform without assist.    Transfers Overall transfer level: Needs assistance Equipment used: Rolling walker (2 wheels) Transfers: Sit to/from Stand, Bed to chair/wheelchair/BSC Sit to Stand:  Supervision           General transfer comment: from EOB<>RW, no lift assist needed, min cues for safety/body mechanics    Ambulation/Gait Ambulation/Gait assistance: Supervision, Min guard Gait Distance (Feet): 65  Feet Assistive device: Rolling walker (2 wheels) Gait Pattern/deviations: Step-through pattern, Decreased step length - right, Decreased step length - left, Decreased stance time - left, Antalgic Gait velocity: Decreased cadence     General Gait Details: cues for step sequencing with RW for decreased pain, no overt LOB, pt not dyspneic but finger sensor not reading HR/O2 during trial on RA, VSS on RA once pt seated EOB. Pt reports moderate fatigue post-exertion.   Stairs Stairs:  (defer, pt with increased pain/fatigue ambulating)           Wheelchair Mobility    Modified Rankin (Stroke Patients Only)       Balance Overall balance assessment: Needs assistance Sitting-balance support: No upper extremity supported, Feet supported Sitting balance-Leahy Scale: Good     Standing balance support: Single extremity supported, During functional activity, Bilateral upper extremity supported Standing balance-Leahy Scale: Fair Standing balance comment: fair static standing unassisted, reliant on RW for weight shifting in stance                            Cognition Arousal/Alertness: Awake/alert Behavior During Therapy: Anxious Overall Cognitive Status: Within Functional Limits for tasks assessed                                 General Comments: pt anxious at prospect of going home if insurance denies CIR and requesting to review DME needs. Pt states she can arrange for constant support at home but appears anxious still.        Exercises General Exercises - Lower Extremity Ankle Circles/Pumps: AROM, Both, 10 reps, Supine Quad Sets: AROM, AAROM, Both, 10 reps, Supine Gluteal Sets: 5 reps, Supine (verbal cues) Heel Slides: Other (comment) (verbal/visual cues, pt defer due to pain) Hip ABduction/ADduction: AAROM, AROM, Both, 5 reps, Supine (verbal/visual cues, pt needs AA in supine due to pain in LLE) Other Exercises Other Exercises: IS x 5 reps,  encouraged pt to perform x10 reps hourly, pt states she has not been compliant today.    General Comments General comments (skin integrity, edema, etc.): SpO2 WFL while seated EOB on RA and poor signal during gait trial; when pt returned to supine, SpO2 gradually dropped from 94% down with 86% on RA with good signal. HOB elevated >30* and slight improvement, but still desatting to 87-89% on RA at rest, RN notified and gave clearance to place pt back on 1L O2 [place] at rest. Per chart review, MD goal 92% for pt SpO2. Pt may need supplemental O2 at rest for home.      Pertinent Vitals/Pain Pain Assessment Pain Assessment: Faces Faces Pain Scale: Hurts even more Pain Location: LLE with movement Pain Descriptors / Indicators: Discomfort, Grimacing, Guarding Pain Intervention(s): Monitored during session, Repositioned, Patient requesting pain meds-RN notified, Ice applied     PT Goals (current goals can now be found in the care plan section) Acute Rehab PT Goals Patient Stated Goal: decreased pain, get better PT Goal Formulation: With patient Time For Goal Achievement: 11/02/22 Progress towards PT goals: Progressing toward goals    Frequency    Min 5X/week      PT Plan Current plan remains  appropriate       AM-PAC PT "6 Clicks" Mobility   Outcome Measure  Help needed turning from your back to your side while in a flat bed without using bedrails?: A Little Help needed moving from lying on your back to sitting on the side of a flat bed without using bedrails?: A Little Help needed moving to and from a bed to a chair (including a wheelchair)?: A Little Help needed standing up from a chair using your arms (e.g., wheelchair or bedside chair)?: A Little Help needed to walk in hospital room?: A Little Help needed climbing 3-5 steps with a railing? : Total (pt too fatigued to attempt today) 6 Click Score: 16    End of Session Equipment Utilized During Treatment: Gait belt;Oxygen;Other  (comment) (O2 reapplied at rest due to desat in supine; poor signal with exertion but no dyspnea) Activity Tolerance: Patient tolerated treatment well;Patient limited by fatigue Patient left: in bed;with call bell/phone within reach;with bed alarm set;Other (comment) (HOB >30* as pt sats dropping with HOB flat; BLE elevated on yellow bone foam with heels floated) Nurse Communication: Mobility status;Patient requests pain meds;Other (comment) (1L O2 [place] applied per RN request due to desat in supine at rest) PT Visit Diagnosis: Muscle weakness (generalized) (M62.81);Pain;Difficulty in walking, not elsewhere classified (R26.2);Unsteadiness on feet (R26.81) Pain - Right/Left: Left Pain - part of body: Hip;Leg     Time: 9233-0076 PT Time Calculation (min) (ACUTE ONLY): 27 min  Charges:  $Gait Training: 8-22 mins $Therapeutic Activity: 8-22 mins                     Argenis Kumari P., PTA Acute Rehabilitation Services Secure Chat Preferred 9a-5:30pm Office: [place] 10/24/2022, 4:10 PM

## 2022-10-24 NOTE — H&P (Incomplete)
Physical Medicine and Rehabilitation Admission H&P    CC: Debility secondary to fall resulting in left intertrochanteric femur fracture  HPI: Samantha Mcbride is a 72 year old female who presented to the emergency department on 10/17/2022 in acute pain after ground-level fall.  Her family's pet dog ran into her and she fell backwards onto her left hip.  Imaging revealed left intertrochanteric femur fracture and orthopedic surgery consulted.  She was admitted and taken to the operating room where she underwent left intramedullary nail placement by Dr. Marcelino Scot on 1/4.  Her history is significant for bulging disc and chronic back pain and she has recently been using a cane to assist with ambulation.  She has a history of chronic kidney disease and anemia of chronic disease, hypertension, hyperlipidemia, coronary disease, COPD, rheumatoid arthritis.  She had some acute blood loss but did not require transfusion.  Her history is also significant for bilateral ureteral calculi and is status post bilateral ureteral stent placement by Dr. Gloriann Loan in 2020.  Serum creatinine is at baseline.  She underwent right breast lumpectomy for breast cancer with radioactive seed and sentinel lymph node biopsy in 2021 by Dr. Marlou Starks.  She is s/p STEMI and had a drug-eluting stent placed to the LAD in 2012 and is maintained on Plavix.  She is tolerating a regular diet. The patient requires inpatient physical medicine and rehabilitation evaluations and treatment secondary to dysfunction due to left femur fracture.  ROS Past Medical History:  Diagnosis Date   Anemia    iron deficiency anemia    CAD S/P percutaneous coronary angioplasty 08/2011   Promus DES - mid LAD 3.5 mm x 24 mm (3.72 mm)    Cancer (HCC)    Chronic kidney disease    stage 4 kidney disease per pt dx 10/9620   Complication of anesthesia    Dyslipidemia, goal LDL below 70     on statin, close to goal   Family history of breast cancer    Family history of skin  cancer    Former moderate cigarette smoker (10-19 per day)    Glucose intolerance (impaired glucose tolerance)    H/O thyroid nodule    Benign   History 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 --> Promus DES =>  EF 40 to 45% (similar to 2012) stable wall motion normality (severe HK of midapical anterior apical wall-consistent with prior infarct).  GRII DD.  Normal valves. => Stable compared to 08/30/2011.  EF was still 40 to 45%.   Hypertension    Personal history of radiation therapy    PONV (postoperative nausea and vomiting)    no issues with surgery on 05-11-2019   Rheumatoid arthritis ([place])    managed on humira    SOB (shortness of breath) on exertion    reports "its been that way since my heart attack " repots no recurrence of MI sx since that time    Past Surgical History:  Procedure Laterality Date   ABDOMINAL AORTAGRAM N/A 08/27/2011   Procedure: ABDOMINAL Maxcine Ham;  Surgeon: Troy Sine, MD;  Location: [facility] CATH LAB;  Service: Cardiovascular;  Laterality: N/A;   ANKLE SURGERY Right 1995   per pt ankle surgery here at [place] EXCISIONAL BIOPSY Left    BREAST LUMPECTOMY Right 07/2020   BREAST LUMPECTOMY WITH RADIOACTIVE SEED AND SENTINEL LYMPH NODE BIOPSY Right 08/05/2020   Procedure:  RIGHT BREAST LUMPECTOMY WITH RADIOACTIVE SEED AND SENTINEL LYMPH NODE BIOPSY;  Surgeon: [name], MD;  Location: [place];  Service: General;  Laterality: Right;   CYSTOSCOPY WITH RETROGRADE PYELOGRAM, URETEROSCOPY AND STENT PLACEMENT Bilateral 05/11/2019   Procedure: CYSTOSCOPY WITH RETROGRADE PYELOGRAM,  AND STENT PLACEMENT;  Surgeon: Lucas Mallow, MD;  Location: [facility];  Service: Urology;  Laterality: Bilateral;   CYSTOSCOPY/URETEROSCOPY/HOLMIUM LASER/STENT PLACEMENT Bilateral 05/25/2019   Procedure: CYSTOSCOPY BILATERAL URETEROSCOPY/HOLMIUM LASER/STENT PLACEMENT;  Surgeon: Lucas Mallow, MD;  Location: [facility];  Service: Urology;  Laterality: Bilateral;   INTRAMEDULLARY (IM) NAIL INTERTROCHANTERIC Left 10/18/2022   Procedure: INTRAMEDULLARY NAILING OF LEFT FEMUR;  Surgeon: Altamese Pennsburg, MD;  Location: [place];  Service: Orthopedics;  Laterality: Left;   LEFT HEART CATHETERIZATION WITH CORONARY ANGIOGRAM N/A 08/27/2011   Procedure: LEFT HEART CATHETERIZATION WITH CORONARY ANGIOGRAM;  Surgeon: Troy Sine, MD;  Location: [facility] CATH LAB;  Service: Cardiovascular;;For anterior STEMI/cardiac arrest -- 100% mid LAD occlusion   PERCUTANEOUS CORONARY STENT INTERVENTION (PCI-S) N/A 08/27/2011   Procedure: PERCUTANEOUS CORONARY STENT INTERVENTION (PCI-S);  Surgeon: Troy Sine, MD;  Location: [facility] CATH LAB;  Service: Cardiovascular;;  mid LAD PCI --> Promus Element DES 3.5 mm at 24 mm (3.72 mm)   TRANSTHORACIC ECHOCARDIOGRAM  08/2011   EF 40-45%, moderate at K. of mid and distal inferior septum and anterior apical myocardium. Grade 1 diastolic function. -- Followup echocardiogram to reassess his EF was denied by insurance company   TRANSTHORACIC ECHOCARDIOGRAM  07/20/2020    EF 40 to 45% (similar to 2012) stable wall motion normality (severe HK of midapical anterior apical wall-consistent with prior infarct).  GRII DD.  Normal valves. => Stable compared to 08/30/2011.  EF was still 40 to 45%.   Family History  Problem Relation Age of Onset   Hypertension Mother    Breast cancer Mother 9       early 47's   Heart failure Father    Heart attack Paternal Grandfather    Breast cancer Sister 8   Skin cancer Sister 14       face   Social History:  reports that she quit smoking about 11 years ago. Her smoking use included cigarettes. She has a 40.00 pack-year smoking history. She has never used smokeless tobacco. She reports that she does not drink alcohol and does not use drugs. Allergies: No Known Allergies Medications Prior to Admission  Medication Sig Dispense Refill    acetaminophen (TYLENOL) 500 MG tablet Take 500 mg by mouth every 6 (six) hours as needed for moderate pain or headache.     amoxicillin (AMOXIL) 500 MG tablet Take 1 tablet (500 mg total) by mouth 2 (two) times daily. 28 tablet 0   Calcium Citrate-Vitamin D (CALCIUM + D PO) Take 1 tablet by mouth daily.     carvedilol (COREG) 12.5 MG tablet Take 12.5 mg by mouth 2 (two) times daily.     clopidogrel (PLAVIX) 75 MG tablet TAKE 1 TABLET BY MOUTH EVERY DAY 90 tablet 3   diclofenac sodium (VOLTAREN) 1 % GEL Apply 2 g topically 4 (four) times daily as needed (pain).      DM-APAP-CPM (CORICIDIN HBP PO) Take 1 tablet by mouth daily as needed (allergies).     ezetimibe (ZETIA) 10 MG tablet TAKE 1 TABLET BY MOUTH EVERY DAY 90 tablet 2   gabapentin (NEURONTIN) 100 MG capsule TAKE 3 CAPSULES BY MOUTH AT BEDTIME. 270 capsule 3   hydroxychloroquine (  PLAQUENIL) 200 MG tablet Take 1 tablet (200 mg total) by mouth daily.     leflunomide (ARAVA) 20 MG tablet Take 20 mg by mouth daily.     Melatonin 5 MG CAPS Take 5 mg by mouth at bedtime.     nitroGLYCERIN (NITROSTAT) 0.4 MG SL tablet PLACE 1 TABLET UNDER THE TONGUE EVERY 5 MINUTES AS NEEDED FOR CHEST PAIN. 25 tablet 4   rosuvastatin (CRESTOR) 20 MG tablet TAKE 1 TABLET BY MOUTH EVERY DAY 90 tablet 1   tiZANidine (ZANAFLEX) 2 MG tablet Take 2 mg by mouth at bedtime.     traMADol (ULTRAM) 50 MG tablet Take by mouth every 12 (twelve) hours as needed.     traZODone (DESYREL) 100 MG tablet Take 100 mg by mouth at bedtime.      COVID-19 mRNA bivalent vaccine, Pfizer, (PFIZER COVID-19 VAC BIVALENT) injection Inject into the muscle. 0.3 mL 0   COVID-19 mRNA vaccine, Pfizer, 30 MCG/0.3ML injection Inject into the muscle. 0.3 mL 0   Olopatadine HCl (PATADAY OP) Place 1 drop into both eyes daily as needed (allergies). (Patient not taking: Reported on 10/17/2022)     promethazine (PHENERGAN) 25 MG suppository  (Patient not taking: Reported on 10/17/2022)        Home: Home  Living Family/patient expects to be discharged to:: Private residence Living Arrangements: Alone Available Help at Discharge: Available PRN/intermittently Type of Home: House (1 story townhouse) Home Access: Stairs to enter [employer] of Steps: 1 step with no rail Home Layout: One level Bathroom Shower/Tub: Gaffer, Chiropodist: Handicapped height Bathroom Accessibility: Yes Home Equipment: Radio producer - single point  Lives With: Spouse   Functional History: Prior Function Prior Level of Function : Independent/Modified Independent  Functional Status:  Mobility: Bed Mobility Overal bed mobility: Needs Assistance Bed Mobility: Sit to Supine Sidelying to sit: Mod assist, HOB elevated Supine to sit: Min assist Sit to supine: Mod assist (utilized gait belt to lift LLE onto bed.) General bed mobility comments: More physical assist provided to transition from sidelying to sit to decrease amount of weightbearing on Left elbow, transitioning up to L edge of bed; pt needs tactile cues/ manual assist to prevent WB through L elbow. Transfers Overall transfer level: Needs assistance Equipment used: Rolling walker (2 wheels) Transfers: Sit to/from Stand, Bed to chair/wheelchair/BSC Sit to Stand: Min guard Bed to/from chair/wheelchair/BSC transfer type:: Step pivot Stand pivot transfers: Mod assist Step pivot transfers: Min assist General transfer comment: greater physical assist required for sit<>stand from lower level surface. No VC needed for hand placement when using RW Ambulation/Gait Ambulation/Gait assistance: Min guard Gait Distance (Feet): 35 Feet (20f, seated break, then 343f Assistive device: Rolling walker (2 wheels) Gait Pattern/deviations: Step-through pattern, Decreased step length - right, Decreased step length - left, Decreased stance time - left, Antalgic General Gait Details: cues for step sequencing with RW for decreased pain, no overt  LOB, pt fatigues after short household distance; O2 on RA at one point reading 86% however noisy signal, when pt relaxed her hand and took standing break, reading 90% and greater on RA. Gait velocity: Decreased cadence Gait velocity interpretation: <1.8 ft/sec, indicate of risk for recurrent falls    ADL: ADL Overall ADL's : Needs assistance/impaired Eating/Feeding: Set up Grooming: Oral care, Wash/dry hands, Supervision/safety, Standing Grooming Details (indicate cue type and reason): at sink with RW Upper Body Bathing: Supervision/ safety Lower Body Bathing: Maximal assistance Upper Body Dressing : Supervision/safety Lower Body Dressing: Minimal  assistance, Sitting/lateral leans Lower Body Dressing Details (indicate cue type and reason): Pt able to don sock on RLE only. Assist needed for LLE. Toilet Transfer: Minimal assistance, Ambulation, Regular Toilet, Grab bars, Rolling walker (2 wheels) Toileting- Clothing Manipulation and Hygiene: Min guard, Sit to/from stand Functional mobility during ADLs: Moderate assistance, Cueing for safety, Rolling walker (2 wheels), Cueing for sequencing  Cognition: Cognition Overall Cognitive Status: Within Functional Limits for tasks assessed Orientation Level: Oriented X4 Cognition Arousal/Alertness: Awake/alert Behavior During Therapy: WFL for tasks assessed/performed Overall Cognitive Status: Within Functional Limits for tasks assessed  Physical Exam: Blood pressure (!) 147/74, pulse 86, temperature 97.8 F (36.6 C), temperature source Oral, resp. rate 15, height '5\' 1"'$  (1.549 m), weight 72.6 kg, SpO2 93 %. Physical Exam  Results for orders placed or performed during the hospital encounter of 10/17/22 (from the past 48 hour(s))  Comprehensive metabolic panel     Status: Abnormal   Collection Time: 10/23/22  4:01 AM  Result Value Ref Range   Sodium 138 135 - 145 mmol/L   Potassium 4.1 3.5 - 5.1 mmol/L   Chloride 105 98 - 111 mmol/L   CO2  24 22 - 32 mmol/L   Glucose, Bld 110 (H) 70 - 99 mg/dL    Comment: Glucose reference range applies only to samples taken after fasting for at least 8 hours.   BUN 29 (H) 8 - 23 mg/dL   Creatinine, Ser 1.71 (H) 0.44 - 1.00 mg/dL   Calcium 8.5 (L) 8.9 - 10.3 mg/dL   Total Protein 5.4 (L) 6.5 - 8.1 g/dL   Albumin 2.5 (L) 3.5 - 5.0 g/dL   AST 14 (L) 15 - 41 U/L   ALT 5 0 - 44 U/L   Alkaline Phosphatase 49 38 - 126 U/L   Total Bilirubin 0.4 0.3 - 1.2 mg/dL   GFR, Estimated 32 (L) >60 mL/min    Comment: (NOTE) Calculated using the CKD-EPI Creatinine Equation (2021)    Anion gap 9 5 - 15    Comment: Performed at [facility], [place] [address], [place], [place] [address]  CBC     Status: Abnormal   Collection Time: 10/23/22  4:01 AM  Result Value Ref Range   WBC 11.8 (H) 4.0 - 10.5 K/uL   RBC 2.62 (L) 3.87 - 5.11 MIL/uL   Hemoglobin 7.9 (L) 12.0 - 15.0 g/dL   HCT 24.1 (L) 36.0 - 46.0 %   MCV 92.0 80.0 - 100.0 fL   MCH 30.2 26.0 - 34.0 pg   MCHC 32.8 30.0 - 36.0 g/dL   RDW 13.2 11.5 - 15.5 %   Platelets 188 150 - 400 K/uL   nRBC 0.0 0.0 - 0.2 %    Comment: Performed at [facility], [place] [address], [place], [place] [address]  Magnesium     Status: None   Collection Time: 10/23/22  4:01 AM  Result Value Ref Range   Magnesium 2.3 1.7 - 2.4 mg/dL    Comment: Performed at [place] [place] [address] [place], [place] [address]  Comprehensive metabolic panel     Status: Abnormal   Collection Time: 10/24/22  3:16 AM  Result Value Ref Range   Sodium 137 135 - 145 mmol/L   Potassium 4.0 3.5 - 5.1 mmol/L   Chloride 105 98 - 111 mmol/L   CO2 25 22 - 32 mmol/L   Glucose, Bld 106 (H) 70 - 99 mg/dL    Comment: Glucose  reference range applies only to samples taken after fasting for at least 8 hours.   BUN 27 (H) 8 - 23 mg/dL   Creatinine, Ser 1.70 (H) 0.44 - 1.00 mg/dL   Calcium 8.6 (L) 8.9 - 10.3 mg/dL   Total Protein 5.5 (L) 6.5 - 8.1 g/dL   Albumin 2.5 (L) 3.5 - 5.0  g/dL   AST 14 (L) 15 - 41 U/L   ALT 6 0 - 44 U/L   Alkaline Phosphatase 57 38 - 126 U/L   Total Bilirubin 0.4 0.3 - 1.2 mg/dL   GFR, Estimated 32 (L) >60 mL/min    Comment: (NOTE) Calculated using the CKD-EPI Creatinine Equation (2021)    Anion gap 7 5 - 15    Comment: Performed at [facility], [place] [address], [place], [place] [address]  CBC     Status: Abnormal   Collection Time: 10/24/22  3:16 AM  Result Value Ref Range   WBC 10.1 4.0 - 10.5 K/uL   RBC 2.63 (L) 3.87 - 5.11 MIL/uL   Hemoglobin 7.8 (L) 12.0 - 15.0 g/dL   HCT 24.5 (L) 36.0 - 46.0 %   MCV 93.2 80.0 - 100.0 fL   MCH 29.7 26.0 - 34.0 pg   MCHC 31.8 30.0 - 36.0 g/dL   RDW 13.2 11.5 - 15.5 %   Platelets 211 150 - 400 K/uL   nRBC 0.0 0.0 - 0.2 %    Comment: Performed at [facility], [place] [address], [place], [place] [address]   No results found.    Blood pressure (!) 147/74, pulse 86, temperature 97.8 F (36.6 C), temperature source Oral, resp. rate 15, height '5\' 1"'$  (1.549 m), weight 72.6 kg, SpO2 93 %.  Medical Problem List and Plan: 1. Functional deficits secondary to left intertrochanteric femur fracture  -patient may *** shower  -ELOS/Goals: *** 2.  Antithrombotics: -DVT/anticoagulation:  {VTE PROPHYLAXIS/ANTICOAGULATION - [id number]}  -antiplatelet therapy: Plavix  3. Pain Management: Tylenol, Norco prn  4. Mood/Behavior/Sleep: LCSW to evaluate and provide emotional support  -antipsychotic agents: n/a  -continue melatonin 5 mg q HS, trazodone 100 mg q HS prn  5. Neuropsych/cognition: This patient is capable of making decisions on her own behalf.  6. Skin/Wound Care: Routine skin care checks  -Incision care: Okay to shower and clean wounds with soap/water  7. Fluids/Electrolytes/Nutrition: Routine I's and O's and follow-up chemistries  8: Left intertrochanteric femur fracture status post IM nail 1/4 by Dr. Marcelino Scot  -Weightbearing as tolerated with assistance using walker  -Unrestricted  range of motion left hip and knee  9: CAD status post DES in 2012: Stable; no complaints of chest pain  -on Plavix PTA  -follows with Dr. Ellyn Hack  10: COPD: Stable; no acute exacerbation  11: Chronic kidney disease stage IV: Serum creatinine 1.70 on 1/10 is at baseline  12: Rheumatoid arthritis: continue hydroxychloroquine, leflunomide  13: Hyperlipidemia: continue Zetia and Crestor  14: Hypertension: monitor TID and prn  -continue carvedilol 12.5 mg BID  15: Obesity, class I: dietary/exercise counseling  16: Back pain/bulging disc: continue gabapentin 300 mg q HS  17: Anemia, chronic disease/acute blood loss: stable; follow-up CBC       ***  Barbie Banner, PA-C 10/24/2022

## 2022-10-24 NOTE — Progress Notes (Signed)
Occupational Therapy Treatment Patient Details Name: Arlee Bossard MRN: [id number] DOB: 1951/05/03 Today's Date: 10/24/2022   History of present illness Pt is a 72 yo F admitted 1/3 following a mechanical fall that resulted in a L intertrochanteric femur fx. She is now s/p IMN and WBAT LLE. PMH significant for HLD, CAD, HTN, breast cx, rheumatoid arthritis, and COPD.   OT comments  Pt in recliner upon therapy arrival and agreeable to participate in OT session. Session focused on functional mobility, transfers, standing tolerance, and ADL re-training. Continues to require increased assist to complete sit<>stand transition from low level surfaces. Discussed options if pt is not able to go to CIR. Daughter lives down the road. Pt's granddaughter will be with her during the night although works during the day and pt would be alone until she returns home. Pt will need a RW and BSC at least for home. Next session: educate on energy conservation techniques. How to use 3in1 as shower seat).   Recommendations for follow up therapy are one component of a multi-disciplinary discharge planning process, led by the attending physician.  Recommendations may be updated based on patient status, additional functional criteria and insurance authorization.    Follow Up Recommendations  Acute inpatient rehab (3hours/day) (If unable to get approval for CIR, pt will need HHOT.    Assistance Recommended at Discharge Intermittent Supervision/Assistance  Patient can return home with the following  Assistance with cooking/housework;A little help with walking and/or transfers;A little help with bathing/dressing/bathroom;Help with stairs or ramp for entrance   Equipment Recommendations  BSC/3in1;Tub/shower seat       Precautions / Restrictions Precautions Precautions: Fall; Watch O2. Pt de-sats in supine. Precaution Comments: LUE bursitis drained 10/17/22 Restrictions Weight Bearing Restrictions: Yes LUE Weight  Bearing: Weight bearing as tolerated LLE Weight Bearing: Weight bearing as tolerated Other Position/Activity Restrictions: Per Dr. Annie Main, pt can weight bear through L wrist & forearm, but try to limit weight bearing through L elbow.       Mobility Bed Mobility Overal bed mobility: Needs Assistance Bed Mobility: Sit to Supine       Sit to supine: Mod assist (utilized gait belt to lift LLE onto bed.)     Patient Response: Cooperative  Transfers Overall transfer level: Needs assistance Equipment used: Rolling walker (2 wheels) Transfers: Sit to/from Stand, Bed to chair/wheelchair/BSC Sit to Stand: Min guard     Step pivot transfers: Min assist     General transfer comment: greater physical assist required for sit<>stand from lower level surface. No VC needed for hand placement when using RW     Balance Overall balance assessment: Needs assistance Sitting-balance support: No upper extremity supported, Feet supported Sitting balance-Leahy Scale: Good     Standing balance support: Single extremity supported, During functional activity, Bilateral upper extremity supported Standing balance-Leahy Scale: Fair Standing balance comment: pt able to perform her own peri-care standing in bathroom with single UE support of RW                           ADL either performed or assessed with clinical judgement   ADL Overall ADL's : Needs assistance/impaired     Grooming: Oral care;Wash/dry hands;Supervision/safety;Standing                   Toilet Transfer: Minimal assistance;Ambulation;Regular Toilet;Grab bars;Rolling walker (2 wheels)   Toileting- Clothing Manipulation and Hygiene: Min guard;Sit to/from stand  Extremity/Trunk Assessment              Vision       Perception     Praxis      Cognition Arousal/Alertness: Awake/alert Behavior During Therapy: WFL for tasks assessed/performed Overall Cognitive Status: Within  Functional Limits for tasks assessed                                          Exercises      Shoulder Instructions       General Comments Supplemental oxygen removed during session. Oxygen sats remained in 90's until holding RW with hand which caused SpO2 to decrease.    Pertinent Vitals/ Pain       Pain Assessment Pain Assessment: Faces Faces Pain Scale: Hurts even more Pain Location: LLE with movement Pain Descriptors / Indicators: Discomfort, Grimacing Pain Intervention(s): Monitored during session, Patient requesting pain meds-RN notified  Home Living                                          Prior Functioning/Environment              Frequency  Min 3X/week        Progress Toward Goals  OT Goals(current goals can now be found in the care plan section)  Progress towards OT goals: Progressing toward goals     Plan Discharge plan remains appropriate;Frequency remains appropriate    Co-evaluation                 AM-PAC OT "6 Clicks" Daily Activity     Outcome Measure   Help from another person eating meals?: None Help from another person taking care of personal grooming?: None Help from another person toileting, which includes using toliet, bedpan, or urinal?: A Little Help from another person bathing (including washing, rinsing, drying)?: A Lot Help from another person to put on and taking off regular upper body clothing?: A Little Help from another person to put on and taking off regular lower body clothing?: A Lot 6 Click Score: 18    End of Session Equipment Utilized During Treatment: Oxygen;Rolling walker (2 wheels)  OT Visit Diagnosis: Unsteadiness on feet (R26.81);Muscle weakness (generalized) (M62.81)   Activity Tolerance Patient tolerated treatment well   Patient Left in bed;with call bell/phone within reach;with nursing/sitter in room   Nurse Communication Patient requests pain meds         Time: 1201-1226 OT Time Calculation (min): 25 min  Charges: OT General Charges $OT Visit: 1 Visit OT Treatments $Self Care/Home Management : 8-22 mins $Therapeutic Activity: 8-22 mins  Ailene Ravel, OTR/L,CBIS  Supplemental OT - [facility] and [facility] Secure Chat Preferred    Kyria Bumgardner, Clarene Duke 10/24/2022, 1:30 PM

## 2022-10-25 DIAGNOSIS — S72002A Fracture of unspecified part of neck of left femur, initial encounter for closed fracture: Secondary | ICD-10-CM

## 2022-10-25 LAB — BASIC METABOLIC PANEL
Anion gap: 6 (ref 5–15)
BUN: 26 mg/dL — ABNORMAL HIGH (ref 8–23)
CO2: 25 mmol/L (ref 22–32)
Calcium: 8.4 mg/dL — ABNORMAL LOW (ref 8.9–10.3)
Chloride: 107 mmol/L (ref 98–111)
Creatinine, Ser: 1.68 mg/dL — ABNORMAL HIGH (ref 0.44–1.00)
GFR, Estimated: 33 mL/min — ABNORMAL LOW (ref >60)
Glucose, Bld: 103 mg/dL — ABNORMAL HIGH (ref 70–99)
Potassium: 4 mmol/L (ref 3.5–5.1)
Sodium: 138 mmol/L (ref 135–145)

## 2022-10-25 LAB — CBC
HCT: 23.5 % — ABNORMAL LOW (ref 36.0–46.0)
Hemoglobin: 7.4 g/dL — ABNORMAL LOW (ref 12.0–15.0)
MCH: 29.2 pg (ref 26.0–34.0)
MCHC: 31.5 g/dL (ref 30.0–36.0)
MCV: 92.9 fL (ref 80.0–100.0)
Platelets: 209 10*3/uL (ref 150–400)
RBC: 2.53 MIL/uL — ABNORMAL LOW (ref 3.87–5.11)
RDW: 13.2 % (ref 11.5–15.5)
WBC: 9.4 10*3/uL (ref 4.0–10.5)
nRBC: 0 % (ref 0.0–0.2)

## 2022-10-25 NOTE — Plan of Care (Signed)
  Problem: Education: Goal: Knowledge of General Education information will improve Description Including pain rating scale, medication(s)/side effects and non-pharmacologic comfort measures Outcome: Progressing   

## 2022-10-25 NOTE — Progress Notes (Signed)
Physical Therapy Treatment Patient Details Name: Samantha Mcbride MRN: [id number] DOB: [birth year] Today's Date: 10/25/2022   History of Present Illness Pt is a 72 yo F admitted 1/3 following a mechanical fall that resulted in a L intertrochanteric femur fx. She is now s/p IMN and WBAT LLE. PMH significant for HLD, CAD, HTN, breast cx, rheumatoid arthritis, and COPD.    PT Comments    Pt received in supine, agreeable to therapy session with heavy encouragement as she reports recent return to bed from toileting, pt reports she has not walked further than 10-34f today. PTA encouraged her to request assist and to work with mobility specialist and other therapies on building standing tolerance/endurance. Pt needing up to minA for bed mobility and mostly Supervision for transfers and gait short household distances. SpO2 89-92% on RA with exertion, improves to 92% with brief standing breaks. SpO2 WFL at rest post-exertion in supine this date. Pt continues to benefit from PT services to progress toward functional mobility goals, she expressed anxiety about going home even with family assist and still wants to try to get into high intensity rehab facility, I encouraged her to work more on standing and walking to bathroom with nursing/therapy assist to build tolerance for OOB activity.    Recommendations for follow up therapy are one component of a multi-disciplinary discharge planning process, led by the attending physician.  Recommendations may be updated based on patient status, additional functional criteria and insurance authorization.  Follow Up Recommendations  Acute inpatient rehab (3hours/day) Can patient physically be transported by private vehicle: Yes   Assistance Recommended at Discharge Frequent or constant Supervision/Assistance  Patient can return home with the following A lot of help with walking and/or transfers;Assist for transportation;Assistance with cooking/housework;Help with stairs or  ramp for entrance   Equipment Recommendations  Rolling walker (2 wheels);BSC/3in1;Other (comment) (may need supplemental O2, will continue to assess with activity)    Recommendations for Other Services       Precautions / Restrictions Precautions Precautions: Fall Precaution Comments: LUE bursitis drained 10/17/22 Restrictions Weight Bearing Restrictions: Yes LUE Weight Bearing: Weight bearing as tolerated LLE Weight Bearing: Weight bearing as tolerated Other Position/Activity Restrictions: Per Dr. GAnnie Main pt can weight bear through L wrist & forearm, but try to limit weight bearing through L elbow. pt frequently noncompliant with this limitation     Mobility  Bed Mobility Overal bed mobility: Needs Assistance Bed Mobility: Sit to Supine, Supine to Sit     Supine to sit: Min assist, HOB elevated Sit to supine: Min assist, HOB elevated (cues for use of gait belt for LLE assist but pt ultimately needing minA for LLE assist over edge of bed and using bed rails PRN)   General bed mobility comments: to L EOB, pt reports no concern about L elbow. Pt c/o difficulty lifting LLE over into bed from L EOB, minA provided after pt attempted with gait belt but was unable to fully perform without assist.    Transfers Overall transfer level: Needs assistance Equipment used: Rolling walker (2 wheels) Transfers: Sit to/from Stand, Bed to chair/wheelchair/BSC Sit to Stand: Supervision           General transfer comment: from EOB<>RW, no lift assist needed, min cues for safety/body mechanics    Ambulation/Gait Ambulation/Gait assistance: Supervision, Min guard Gait Distance (Feet): 60 Feet Assistive device: Rolling walker (2 wheels) Gait Pattern/deviations: Step-through pattern, Decreased step length - right, Decreased step length - left, Decreased stance time - left, Antalgic Gait  velocity: Decreased cadence     General Gait Details: cues for step sequencing with RW for decreased  pain, no overt LOB, pt not dyspneic, SpO2 89% on RA with exertion, improves to 91-92% within 10-20 seconds standing break, RN notified   Stairs             Wheelchair Mobility    Modified Rankin (Stroke Patients Only)       Balance Overall balance assessment: Needs assistance Sitting-balance support: No upper extremity supported, Feet supported Sitting balance-Leahy Scale: Good     Standing balance support: Single extremity supported, During functional activity, Bilateral upper extremity supported Standing balance-Leahy Scale: Fair Standing balance comment: fair static standing unassisted, reliant on RW for weight shifting in stance                            Cognition Arousal/Alertness: Awake/alert Behavior During Therapy: Anxious, Flat affect Overall Cognitive Status: Within Functional Limits for tasks assessed                                 General Comments: pt anxious at prospect of going home if insurance denies CIR. Pt states she can arrange for constant support at home but appears anxious still, she states her daughter "also has concerns" but wouldn't explain what concerns her daughter had/whether she could provide pt enough assist or not.        Exercises Other Exercises Other Exercises: encouraged IS use    General Comments        Pertinent Vitals/Pain Pain Assessment Pain Assessment: 0-10 Pain Score: 6  Pain Location: LLE with movement Pain Descriptors / Indicators: Discomfort, Grimacing, Guarding Pain Intervention(s): Monitored during session, Repositioned, Ice applied (heels floated)    Home Living                          Prior Function            PT Goals (current goals can now be found in the care plan section) Acute Rehab PT Goals Patient Stated Goal: decreased pain, get better PT Goal Formulation: With patient Time For Goal Achievement: 11/02/22 Progress towards PT goals: Progressing toward  goals    Frequency    Min 5X/week      PT Plan Current plan remains appropriate    Co-evaluation              AM-PAC PT "6 Clicks" Mobility   Outcome Measure  Help needed turning from your back to your side while in a flat bed without using bedrails?: A Little Help needed moving from lying on your back to sitting on the side of a flat bed without using bedrails?: A Little Help needed moving to and from a bed to a chair (including a wheelchair)?: A Little Help needed standing up from a chair using your arms (e.g., wheelchair or bedside chair)?: A Little Help needed to walk in hospital room?: A Little Help needed climbing 3-5 steps with a railing? : Total (pt too fatigued to attempt today) 6 Click Score: 16    End of Session Equipment Utilized During Treatment: Gait belt Activity Tolerance: Patient tolerated treatment well;Patient limited by fatigue Patient left: in bed;with call bell/phone within reach;with bed alarm set;Other (comment) (HOB >30* as pt preparing to drink water) Nurse Communication: Mobility status;Other (comment) (SpO2 WFL on RA desat  89% with exertion but improves with standing breaks) PT Visit Diagnosis: Muscle weakness (generalized) (M62.81);Pain;Difficulty in walking, not elsewhere classified (R26.2);Unsteadiness on feet (R26.81) Pain - Right/Left: Left Pain - part of body: Hip;Leg     Time: 5844-1712 PT Time Calculation (min) (ACUTE ONLY): 18 min  Charges:  $Gait Training: 8-22 mins                     Gradyn Shein P., PTA Acute Rehabilitation Services Secure Chat Preferred 9a-5:30pm Office: [place] 10/25/2022, 6:56 PM

## 2022-10-25 NOTE — Progress Notes (Signed)
PROGRESS NOTE  Samantha Mcbride URK:[id number] DOB: [birth year] DOA: 10/17/2022 PCP: Antony Contras, MD   LOS: 7 days   Brief Narrative / Interim history: 72 year old female with history of CAD status post STEMI and DES in 2012, peri-infarct V. tach, COPD, CKD 4, RA, HLD, HTN, breast cancer in remission comes in after a ground-level fall.  Her daughter's dog ran into her and she fell backwards onto her left hip.  Imaging on admission showed left intertrochanteric femur fracture.  Orthopedics consulted, and she was admitted to the hospital, status post operative repair on 1/4.  Plan for CIR when insurance auth complete.  Subjective / 24h Interval events: Awaiting insurance auth Needs pulmonary toiletry   Assesement and Plan: Principal Problem:   Intertrochanteric fracture of left hip (HCC) Active Problems:   Hyperlipidemia associated with type 2 diabetes mellitus (HCC)   Obesity (BMI 30-39.9)   H/O Anterior STEMI:  Proximal LAD  insertion of a 3.5x24 mm Promus element DES (08/2011)   CAD S/P percutaneous coronary angioplasty   Essential hypertension   Malignant neoplasm of upper-outer quadrant of right breast in female, estrogen receptor positive (HCC)   Chronic kidney disease, stage 4 (severe) (HCC)   Rheumatoid arthritis (HCC)   COPD (chronic obstructive pulmonary disease) with emphysema ([place]) noted on CT   Hip fracture (HCC)    Left intertrochanteric hip fracture -appreciate orthopedic surgery follow-up, status post IM nail 10/18/2022 by Dr. Marcelino Scot.  Continue to mobilize with PT, CIR has been recommended but insurance denied.  Expedited appeal is in process   CKD 4 -her creatinine is overall stable.    Hypokalemia -repleted  Acute blood loss anemia-postoperatively, hemoglobin dipped to 7.8 and has remained stable.  Monitor, no need for transfusions.  Monitor CBC periodically  RA -continue hydroxychloroquine, leflunomide  CAD -no chest pain, this has been stable since her STEMI 11  years ago.  Seeing Dr. Ellyn Hack as an outpatient on a regular basis.  No red flag symptoms, no chest pains, no dyspnea on exertion, no lower extremity swelling  HLD - continue statin, Zetia  HTN -continue carvedilol  History of breast cancer -outpatient follow-up  Scheduled Meds:  carvedilol  12.5 mg Oral BID   clopidogrel  75 mg Oral Daily   docusate sodium  100 mg Oral BID   ezetimibe  10 mg Oral Daily   gabapentin  300 mg Oral QHS   hydroxychloroquine  200 mg Oral Daily   leflunomide  20 mg Oral Daily   melatonin  5 mg Oral QHS   rosuvastatin  20 mg Oral Daily   Continuous Infusions:  PRN Meds:.HYDROcodone-acetaminophen, HYDROmorphone (DILAUDID) injection, menthol-cetylpyridinium **OR** phenol, metoCLOPramide **OR** metoCLOPramide (REGLAN) injection, ondansetron **OR** ondansetron (ZOFRAN) IV, traZODone  Current Outpatient Medications  Medication Instructions   acetaminophen (TYLENOL) 500 mg, Oral, Every 6 hours PRN   amoxicillin (AMOXIL) 500 mg, Oral, 2 times daily   Calcium Citrate-Vitamin D (CALCIUM + D PO) 1 tablet, Oral, Daily   carvedilol (COREG) 12.5 mg, Oral, 2 times daily   clopidogrel (PLAVIX) 75 MG tablet TAKE 1 TABLET BY MOUTH EVERY DAY   COVID-19 mRNA bivalent vaccine, Pfizer, (PFIZER COVID-19 VAC BIVALENT) injection Intramuscular   COVID-19 mRNA vaccine, Pfizer, 30 MCG/0.3ML injection Intramuscular   diclofenac sodium (VOLTAREN) 2 g, Topical, 4 times daily PRN   DM-APAP-CPM (CORICIDIN HBP PO) 1 tablet, Oral, Daily PRN   ezetimibe (ZETIA) 10 MG tablet TAKE 1 TABLET BY MOUTH EVERY DAY   gabapentin (NEURONTIN) 100 MG capsule TAKE  3 CAPSULES BY MOUTH AT BEDTIME.   hydroxychloroquine (PLAQUENIL) 200 mg, Oral, Daily   leflunomide (ARAVA) 20 mg, Oral, Daily   Melatonin 5 mg, Oral, Daily at bedtime   nitroGLYCERIN (NITROSTAT) 0.4 MG SL tablet PLACE 1 TABLET UNDER THE TONGUE EVERY 5 MINUTES AS NEEDED FOR CHEST PAIN.   Olopatadine HCl (PATADAY OP) 1 drop, Daily PRN    promethazine (PHENERGAN) 25 MG suppository No dose, route, or frequency recorded.   rosuvastatin (CRESTOR) 20 MG tablet TAKE 1 TABLET BY MOUTH EVERY DAY   tiZANidine (ZANAFLEX) 2 mg, Oral, Daily at bedtime   traMADol (ULTRAM) 50 MG tablet Oral, Every 12 hours PRN   traZODone (DESYREL) 100 mg, Oral, Daily at bedtime    Diet Orders (From admission, onward)     Start     Ordered   10/18/22 1826  Diet regular Room service appropriate? Yes; Fluid consistency: Thin  Diet effective now       Question Answer Comment  Room service appropriate? Yes   Fluid consistency: Thin      10/18/22 1825            DVT prophylaxis: SCDs Start: 10/18/22 1826 SCDs Start: 10/17/22 1415   Lab Results  Component Value Date   PLT 209 10/25/2022      Code Status: Full Code  Family Communication: no family at bedside   Status is: Inpatient   Level of care: Med-Surg  Consultants:  Orthopedic surgery   Objective: Vitals:   10/24/22 1541 10/24/22 2011 10/25/22 0412 10/25/22 0817  BP:  127/83 114/64 125/72  Pulse:  94 81 86  Resp:  '19 17 18  '$ Temp:  98.7 F (37.1 C) 98.2 F (36.8 C) 98 F (36.7 C)  TempSrc:  Oral Oral Oral  SpO2: 92% 94% 95% 91%  Weight:      Height:       No intake or output data in the 24 hours ending 10/25/22 1033  Wt Readings from Last 3 Encounters:  10/18/22 72.6 kg  07/24/22 73.9 kg  07/23/22 73.7 kg    Examination:   General: Appearance:    Obese female in no acute distress     Lungs:     respirations unlabored  Heart:    Normal heart rate. Normal rhythm. No murmurs, rubs, or gallops.   MS:   All extremities are intact.   Neurologic:   Awake, alert    Data Reviewed: I have independently reviewed following labs and imaging studies   CBC Recent Labs  Lab 10/20/22 0412 10/21/22 0313 10/23/22 0401 10/24/22 0316 10/25/22 0352  WBC 11.9* 9.6 11.8* 10.1 9.4  HGB 8.5* 7.9* 7.9* 7.8* 7.4*  HCT 26.3* 25.6* 24.1* 24.5* 23.5*  PLT 169 161 188 211  209  MCV 90.7 93.4 92.0 93.2 92.9  MCH 29.3 28.8 30.2 29.7 29.2  MCHC 32.3 30.9 32.8 31.8 31.5  RDW 13.2 13.2 13.2 13.2 13.2    Recent Labs  Lab 10/19/22 0526 10/20/22 0412 10/21/22 0313 10/23/22 0401 10/24/22 0316 10/25/22 0352  NA 133* 138 137 138 137 138  K 4.0 4.3 3.4* 4.1 4.0 4.0  CL 101 109 107 105 105 107  CO2 22 16* '23 24 25 25  '$ GLUCOSE 159* 124* 97 110* 106* 103*  BUN 31* 39* 29* 29* 27* 26*  CREATININE 2.06* 2.26* 1.92* 1.71* 1.70* 1.68*  CALCIUM 8.3* 8.3* 8.5* 8.5* 8.6* 8.4*  AST  --   --   --  14* 14*  --  ALT  --   --   --  5 6  --   ALKPHOS  --   --   --  49 57  --   BILITOT  --   --   --  0.4 0.4  --   ALBUMIN  --   --   --  2.5* 2.5*  --   MG 2.2 QUANTITY NOT SUFFICIENT, UNABLE TO PERFORM TEST 2.4 2.3  --   --     ------------------------------------------------------------------------------------------------------------------ No results for input(s): "CHOL", "HDL", "LDLCALC", "TRIG", "CHOLHDL", "LDLDIRECT" in the last 72 hours.  Lab Results  Component Value Date   HGBA1C 5.6 08/07/2013   ------------------------------------------------------------------------------------------------------------------ No results for input(s): "TSH", "T4TOTAL", "T3FREE", "THYROIDAB" in the last 72 hours.  Invalid input(s): "FREET3"  Cardiac Enzymes No results for input(s): "CKMB", "TROPONINI", "MYOGLOBIN" in the last 168 hours.  Invalid input(s): "CK" ------------------------------------------------------------------------------------------------------------------ No results found for: "BNP"  CBG: No results for input(s): "GLUCAP" in the last 168 hours.  Recent Results (from the past 240 hour(s))  Surgical pcr screen     Status: None   Collection Time: 10/18/22  5:37 AM   Specimen: Nasal Mucosa; Nasal Swab  Result Value Ref Range Status   MRSA, PCR NEGATIVE NEGATIVE Final   Staphylococcus aureus NEGATIVE NEGATIVE Final    Comment: (NOTE) The Xpert SA Assay  (FDA approved for NASAL specimens in patients 30 years of age and older), is one component of a comprehensive surveillance program. It is not intended to diagnose infection nor to guide or monitor treatment. Performed at [facility], [place] [address] [place], [place] [address]      Radiology Studies: No results found.   Eulogio Bear DO [facility]  Between 7 am - 7 pm I am available, please contact me via Amion (for emergencies) or Securechat (non urgent messages)  Between 7 pm - 7 am I am not available, please contact night coverage MD/APP via Amion

## 2022-10-26 DIAGNOSIS — E041 Nontoxic single thyroid nodule: Secondary | ICD-10-CM | POA: Diagnosis not present

## 2022-10-26 DIAGNOSIS — S72002A Fracture of unspecified part of neck of left femur, initial encounter for closed fracture: Secondary | ICD-10-CM | POA: Diagnosis not present

## 2022-10-26 NOTE — Progress Notes (Signed)
Durable Medical Equipment  (From admission, onward)           Start     Ordered   10/26/22 1454  For home use only DME 3 n 1  Once       Comments: Confined to one room   10/26/22 1453   10/26/22 1240  For home use only DME Walker rolling  Once       Question Answer Comment  Walker: With 5 Inch Wheels   Patient needs a walker to treat with the following condition Hip fracture ([place])      10/26/22 1239

## 2022-10-26 NOTE — Progress Notes (Signed)
Mobility Specialist Progress Note   10/26/22 1159  Mobility  Activity Ambulated with assistance in hallway  Level of Assistance Contact guard assist, steadying assist  Assistive Device Front wheel walker  Distance Ambulated (ft) 86 ft  LUE Weight Bearing WBAT  LLE Weight Bearing WBAT  Activity Response Tolerated well  Mobility Referral Yes  $Mobility charge 1 Mobility   Pre Mobility: 90% SpO2 During Mobility: 92% SpO2 Post Mobility: 95% SpO2  Received in chair having no complaint and agreeable. Min cues given for hand placement upon standing and MinG during ambulation. Pt's SpO2 ranging from 90% - 95% throughout ambulation w/ no signs of DOE or SOB. Returned back to room w/ pt's LLE becoming fatigued and gait becoming antalgic but able to make it back to bed w/o fault. Left w/ needs met and bed alarm on.   Holland Falling Mobility Specialist Please contact via SecureChat or  Rehab office at [phone]

## 2022-10-26 NOTE — Progress Notes (Signed)
Occupational Therapy Treatment and Discharge Patient Details Name: Samantha Mcbride MRN: [id number] DOB: [birth year] Today's Date: 10/26/2022   History of present illness Pt is a 72 yo F admitted 1/3 following a mechanical fall that resulted in a L intertrochanteric femur fx. She is now s/p IMN and WBAT LLE. PMH significant for HLD, CAD, HTN, breast cx, rheumatoid arthritis, and COPD.   OT comments  Pt agreeable to participate in OT treatment session. Session focused on providing patient education on energy conservation techniques and ways to incorporate them at home while completing ADL tasks. Pt was able to verbalize ways that she can implement strategies at home. Handout was provided for reference. Pt verbalized understanding. All education complete and questions addressed. No further acute OT services are needed at this time. Pt will continue to benefit from therapy services when at home with [place].    Recommendations for follow up therapy are one component of a multi-disciplinary discharge planning process, led by the attending physician.  Recommendations may be updated based on patient status, additional functional criteria and insurance authorization.    Follow Up Recommendations  Home health OT     Assistance Recommended at Discharge PRN  Patient can return home with the following  Assistance with cooking/housework;A little help with walking and/or transfers;A little help with bathing/dressing/bathroom;Help with stairs or ramp for entrance   Equipment Recommendations  BSC/3in1       Precautions / Restrictions Precautions Precautions: Fall Precaution Comments: LUE bursitis drained 10/17/22 Restrictions Weight Bearing Restrictions: Yes LUE Weight Bearing: Weight bearing as tolerated LLE Weight Bearing: Weight bearing as tolerated Other Position/Activity Restrictions: Per Dr. Annie Main, pt can weight bear through L wrist & forearm, but try to limit weight bearing through L elbow. pt  frequently noncompliant with this limitation              ADL either performed or assessed with clinical judgement      Cognition Arousal/Alertness: Awake/alert Behavior During Therapy: WFL for tasks assessed/performed Overall Cognitive Status: Within Functional Limits for tasks assessed          Exercises Other Exercises Other Exercises: Patient education provided regarding energy conservation techniques and strategies at home when completing ADL tasks. Discussed current strategies in place as well as areas to implement. Handout provided for reference. Pt verbalized understanding.            Pertinent Vitals/ Pain       Pain Assessment Pain Assessment: No/denies pain      Progress Toward Goals  OT Goals(current goals can now be found in the care plan section)  Progress towards OT goals: Goals met/education completed, patient discharged from [place] All goals met and education completed, patient discharged from [place] OT "6 Clicks" Daily Activity     Outcome Measure   Help from another person eating meals?: None Help from another person taking care of personal grooming?: None Help from another person toileting, which includes using toliet, bedpan, or urinal?: A Little Help from another person bathing (including washing, rinsing, drying)?: A Little Help from another person to put on and taking off regular upper body clothing?: A Little Help from another person to put on and taking off regular lower body clothing?: A Little 6 Click Score: 20    End of Session    OT Visit Diagnosis: Unsteadiness on feet (R26.81);Muscle weakness (generalized) (M62.81)   Activity Tolerance Patient tolerated treatment well  Patient Left in bed;with call bell/phone within reach           Time: 1337-1349 OT Time Calculation (min): 12 min  Charges: OT General Charges $OT Visit: 1 Visit OT Treatments $Therapeutic Activity: 8-22 mins  Ailene Ravel, OTR/L,CBIS  Supplemental OT - [facility] and [facility] Secure Chat Preferred    Jamaine Quintin, Clarene Duke 10/26/2022, 2:58 PM

## 2022-10-26 NOTE — Plan of Care (Signed)
  Problem: Education: Goal: Knowledge of General Education information will improve Description: Including pain rating scale, medication(s)/side effects and non-pharmacologic comfort measures Outcome: Progressing   Problem: Health Behavior/Discharge Planning: Goal: Ability to manage health-related needs will improve Outcome: Progressing   Problem: Clinical Measurements: Goal: Diagnostic test results will improve Outcome: Progressing   Problem: Pain Managment: Goal: General experience of comfort will improve Outcome: Progressing   Problem: Pain Management: Goal: Pain level will decrease Outcome: Progressing

## 2022-10-26 NOTE — TOC Progression Note (Signed)
Transition of Care [facility]) - Progression Note    Patient Details  Name: Samantha Mcbride MRN: [id number] Date of Birth: [birth year]  [facility] CM/SW Contact  Sharin Mons, RN Phone Number: 10/26/2022, 1:57 PM  Clinical Narrative:      - s/p INM  OF LEFT FEMUR 1/4 Denial appeal for CIR upheld. Pt not interested in SNF placement. States will transition to home and is agreeable to home health services. States daughter and granddaughter will assist with care  once d/c. Pt without provider preference as long as provider is in network with insurance. Referral made with  [facility] and accepted. Referral made with [employer] for RW and [facility] Equipment will be delivered to bedside prior to d/c.   TOC following and will continue to assist with needs....   Expected Discharge Plan: [place] Barriers to Discharge: Continued Medical Work up  Expected Discharge Plan and [place] arrangements for the past 2 months: Single Family Home                 DME Arranged: 3-N-1, Walker rolling DME Agency: [employer] Date DME Agency Contacted: 10/26/22 Time DME Agency Contacted: [phone] Representative spoke with at DME Agency: [place]: PT, OT [place] Agency: [place] ([place]) Date [place]: 10/26/22 Time [place]: [phone] Representative spoke with at [place]: [place] ([place]) Interventions [place]: No Food Insecurity (10/17/2022)  Housing: Low Risk  (10/17/2022)  Transportation Needs: No Transportation Needs (10/17/2022)  Utilities: Not At Risk (10/17/2022)  Tobacco Use: Medium Risk (10/19/2022)    Readmission Risk Interventions     No data to display

## 2022-10-26 NOTE — Care Management Important Message (Signed)
Important Message  Patient Details  Name: Samantha Mcbride MRN: [id number] Date of Birth: 09/17/1951   Medicare Important Message Given:  Yes     Hannah Beat 10/26/2022, 1:42 PM

## 2022-10-26 NOTE — Progress Notes (Signed)
Mobility Specialist Progress Note  Nurse requested Mobility Specialist to perform oxygen saturation test with pt which includes removing pt from oxygen both at rest and while ambulating.  Below are the results from that testing.     Patient Saturations on Room Air at Rest = SpO2 90%  Patient Saturations on Room Air while Ambulating = SpO2 92% .   Rested and performed pursed lip breathing for 1 minute with SpO2 at 93%.  Patient Saturations on 0 Liters of oxygen while Ambulating = SpO2 92%  At end of testing pt left in room on 0  Liters of oxygen.  Reported results to nurse.    Holland Falling Mobility Specialist Please contact via SecureChat or  Rehab office at [phone]

## 2022-10-26 NOTE — Progress Notes (Signed)
PROGRESS NOTE  Samantha Mcbride NUU:[id number] DOB: [birth year] DOA: 10/17/2022 PCP: Antony Contras, MD   LOS: 8 days   Brief Narrative / Interim history: 72 year old female with history of CAD status post STEMI and DES in 2012, peri-infarct V. tach, COPD, CKD 4, RA, HLD, HTN, breast cancer in remission comes in after a ground-level fall.  Her daughter's dog ran into her and she fell backwards onto her left hip.  Imaging on admission showed left intertrochanteric femur fracture.  Orthopedics consulted, and she was admitted to the hospital, status post operative repair on 1/4.  Plan for CIR when insurance auth complete.  Subjective / 24h Interval events: Insurance denied today-- home in AM  Assesement and Plan: Principal Problem:   Intertrochanteric fracture of left hip (HCC) Active Problems:   Hyperlipidemia associated with type 2 diabetes mellitus (HCC)   Obesity (BMI 30-39.9)   H/O Anterior STEMI:  Proximal LAD  insertion of a 3.5x24 mm Promus element DES (08/2011)   CAD S/P percutaneous coronary angioplasty   Essential hypertension   Malignant neoplasm of upper-outer quadrant of right breast in female, estrogen receptor positive (HCC)   Chronic kidney disease, stage 4 (severe) (HCC)   Rheumatoid arthritis (HCC)   COPD (chronic obstructive pulmonary disease) with emphysema ([place]) noted on CT   Hip fracture (HCC)    Left intertrochanteric hip fracture -appreciate orthopedic surgery follow-up, status post IM nail 10/18/2022 by Dr. Marcelino Scot.  Continue to mobilize with PT Rehab stay denies  CKD 4 -her creatinine is overall stable.    Hypokalemia -repleted  Acute blood loss anemia-postoperatively, hemoglobin dipped to 7.8 and has remained stable.  Monitor, no need for transfusions.  Monitor CBC periodically  RA -continue hydroxychloroquine, leflunomide  CAD -no chest pain, this has been stable since her STEMI 11 years ago.  Seeing Dr. Ellyn Hack as an outpatient on a regular basis.  No red  flag symptoms, no chest pains, no dyspnea on exertion, no lower extremity swelling  HLD - continue statin, Zetia  HTN -continue carvedilol  History of breast cancer -outpatient follow-up  Scheduled Meds:  carvedilol  12.5 mg Oral BID   clopidogrel  75 mg Oral Daily   docusate sodium  100 mg Oral BID   ezetimibe  10 mg Oral Daily   gabapentin  300 mg Oral QHS   hydroxychloroquine  200 mg Oral Daily   leflunomide  20 mg Oral Daily   melatonin  5 mg Oral QHS   rosuvastatin  20 mg Oral Daily   Continuous Infusions:  PRN Meds:.HYDROcodone-acetaminophen, HYDROmorphone (DILAUDID) injection, menthol-cetylpyridinium **OR** phenol, metoCLOPramide **OR** metoCLOPramide (REGLAN) injection, ondansetron **OR** ondansetron (ZOFRAN) IV, traZODone  Current Outpatient Medications  Medication Instructions   acetaminophen (TYLENOL) 500 mg, Oral, Every 6 hours PRN   amoxicillin (AMOXIL) 500 mg, Oral, 2 times daily   Calcium Citrate-Vitamin D (CALCIUM + D PO) 1 tablet, Oral, Daily   carvedilol (COREG) 12.5 mg, Oral, 2 times daily   clopidogrel (PLAVIX) 75 MG tablet TAKE 1 TABLET BY MOUTH EVERY DAY   COVID-19 mRNA bivalent vaccine, Pfizer, (PFIZER COVID-19 VAC BIVALENT) injection Intramuscular   COVID-19 mRNA vaccine, Pfizer, 30 MCG/0.3ML injection Intramuscular   diclofenac sodium (VOLTAREN) 2 g, Topical, 4 times daily PRN   DM-APAP-CPM (CORICIDIN HBP PO) 1 tablet, Oral, Daily PRN   ezetimibe (ZETIA) 10 MG tablet TAKE 1 TABLET BY MOUTH EVERY DAY   gabapentin (NEURONTIN) 100 MG capsule TAKE 3 CAPSULES BY MOUTH AT BEDTIME.   hydroxychloroquine (PLAQUENIL) 200 mg,  Oral, Daily   leflunomide (ARAVA) 20 mg, Oral, Daily   Melatonin 5 mg, Oral, Daily at bedtime   nitroGLYCERIN (NITROSTAT) 0.4 MG SL tablet PLACE 1 TABLET UNDER THE TONGUE EVERY 5 MINUTES AS NEEDED FOR CHEST PAIN.   Olopatadine HCl (PATADAY OP) 1 drop, Daily PRN   promethazine (PHENERGAN) 25 MG suppository No dose, route, or frequency  recorded.   rosuvastatin (CRESTOR) 20 MG tablet TAKE 1 TABLET BY MOUTH EVERY DAY   tiZANidine (ZANAFLEX) 2 mg, Oral, Daily at bedtime   traMADol (ULTRAM) 50 MG tablet Oral, Every 12 hours PRN   traZODone (DESYREL) 100 mg, Oral, Daily at bedtime    Diet Orders (From admission, onward)     Start     Ordered   10/18/22 1826  Diet regular Room service appropriate? Yes; Fluid consistency: Thin  Diet effective now       Question Answer Comment  Room service appropriate? Yes   Fluid consistency: Thin      10/18/22 1825            DVT prophylaxis: SCDs Start: 10/18/22 1826 SCDs Start: 10/17/22 1415   Lab Results  Component Value Date   PLT 209 10/25/2022      Code Status: Full Code  Family Communication: daughter at bedside  Status is: Inpatient   Level of care: Med-Surg  Consultants:  Orthopedic surgery   Objective: Vitals:   10/25/22 2003 10/26/22 0554 10/26/22 0945 10/26/22 1244  BP: 129/79 (!) 140/71  112/63  Pulse: 91 80  90  Resp: '18 16  15  '$ Temp: 98 F (36.7 C) 98.4 F (36.9 C)  (!) 97.4 F (36.3 C)  TempSrc:  Oral  Oral  SpO2: 92% 95% 93% 93%  Weight:      Height:        Intake/Output Summary (Last 24 hours) at 10/26/2022 1253 Last data filed at 10/26/2022 0900 Gross per 24 hour  Intake 360 ml  Output --  Net 360 ml    Wt Readings from Last 3 Encounters:  10/18/22 72.6 kg  07/24/22 73.9 kg  07/23/22 73.7 kg    Examination:    General: Appearance:    Obese female in no acute distress     Lungs:      respirations unlabored  Heart:    Normal heart rate. Normal rhythm. No murmurs, rubs, or gallops.   MS:   All extremities are intact.   Neurologic:   Awake, alert      Data Reviewed: I have independently reviewed following labs and imaging studies   CBC Recent Labs  Lab 10/20/22 0412 10/21/22 0313 10/23/22 0401 10/24/22 0316 10/25/22 0352  WBC 11.9* 9.6 11.8* 10.1 9.4  HGB 8.5* 7.9* 7.9* 7.8* 7.4*  HCT 26.3* 25.6* 24.1* 24.5*  23.5*  PLT 169 161 188 211 209  MCV 90.7 93.4 92.0 93.2 92.9  MCH 29.3 28.8 30.2 29.7 29.2  MCHC 32.3 30.9 32.8 31.8 31.5  RDW 13.2 13.2 13.2 13.2 13.2    Recent Labs  Lab 10/20/22 0412 10/21/22 0313 10/23/22 0401 10/24/22 0316 10/25/22 0352  NA 138 137 138 137 138  K 4.3 3.4* 4.1 4.0 4.0  CL 109 107 105 105 107  CO2 16* '23 24 25 25  '$ GLUCOSE 124* 97 110* 106* 103*  BUN 39* 29* 29* 27* 26*  CREATININE 2.26* 1.92* 1.71* 1.70* 1.68*  CALCIUM 8.3* 8.5* 8.5* 8.6* 8.4*  AST  --   --  14* 14*  --  ALT  --   --  5 6  --   ALKPHOS  --   --  49 57  --   BILITOT  --   --  0.4 0.4  --   ALBUMIN  --   --  2.5* 2.5*  --   MG QUANTITY NOT SUFFICIENT, UNABLE TO PERFORM TEST 2.4 2.3  --   --     ------------------------------------------------------------------------------------------------------------------ No results for input(s): "CHOL", "HDL", "LDLCALC", "TRIG", "CHOLHDL", "LDLDIRECT" in the last 72 hours.  Lab Results  Component Value Date   HGBA1C 5.6 08/07/2013   ------------------------------------------------------------------------------------------------------------------ No results for input(s): "TSH", "T4TOTAL", "T3FREE", "THYROIDAB" in the last 72 hours.  Invalid input(s): "FREET3"  Cardiac Enzymes No results for input(s): "CKMB", "TROPONINI", "MYOGLOBIN" in the last 168 hours.  Invalid input(s): "CK" ------------------------------------------------------------------------------------------------------------------ No results found for: "BNP"  CBG: No results for input(s): "GLUCAP" in the last 168 hours.  Recent Results (from the past 240 hour(s))  Surgical pcr screen     Status: None   Collection Time: 10/18/22  5:37 AM   Specimen: Nasal Mucosa; Nasal Swab  Result Value Ref Range Status   MRSA, PCR NEGATIVE NEGATIVE Final   Staphylococcus aureus NEGATIVE NEGATIVE Final    Comment: (NOTE) The Xpert SA Assay (FDA approved for NASAL specimens in patients  23 years of age and older), is one component of a comprehensive surveillance program. It is not intended to diagnose infection nor to guide or monitor treatment. Performed at [facility], [place] [address] [place], [place] [address]      Radiology Studies: No results found.   Eulogio Bear DO [facility]  Between 7 am - 7 pm I am available, please contact me via Amion (for emergencies) or Securechat (non urgent messages)  Between 7 pm - 7 am I am not available, please contact night coverage MD/APP via Amion

## 2022-10-26 NOTE — Progress Notes (Signed)
Inpatient Rehab Admissions Coordinator:  Lera with [employer] left a message informing AC that denial was upheld after expedited appeal. Pt, pt's daughter, Dr. Eliseo Squires and [facility] made aware. AC will sign off.   Gayland Curry, [place], [place] Admissions Coordinator [phone]

## 2022-10-26 NOTE — Progress Notes (Signed)
Physical Therapy Treatment Patient Details Name: Samantha Mcbride MRN: [id number] DOB: 09-Feb-1951 Today's Date: 10/26/2022   History of Present Illness Pt is a 72 yo F admitted 1/3 following a mechanical fall that resulted in a L intertrochanteric femur fx. She is now s/p IMN and WBAT LLE. PMH significant for HLD, CAD, HTN, breast cx, rheumatoid arthritis, and COPD.    PT Comments    Pt received in supine, agreeable to therapy session and with good participation and tolerance for bed mobility from flat bed without rails per home set-up, transfer instruction, gait training with RW and single stair training. Pt needing up to minA for stair ascent/descent for standard height step and otherwise min guard to Supervision level for all tasks. Pt making excellent progress toward goals and after discussion is now agreeable to HHPT as she is comfortable mobilizing at home with increased PRN assist from family members. Handouts brought to room to reinforce instruction. Pt continues to benefit from PT services to progress toward functional mobility goals. Disposition updated below per discussion with pt and supervising PT Lynnell Jude.   Recommendations for follow up therapy are one component of a multi-disciplinary discharge planning process, led by the attending physician.  Recommendations may be updated based on patient status, additional functional criteria and insurance authorization.  Follow Up Recommendations  Home health PT Can patient physically be transported by private vehicle: Yes   Assistance Recommended at Discharge Frequent or constant Supervision/Assistance  Patient can return home with the following A little help with walking and/or transfers;Assist for transportation;Help with stairs or ramp for entrance;Assistance with cooking/housework   Equipment Recommendations  Rolling walker (2 wheels);BSC/3in1    Recommendations for Other Services       Precautions / Restrictions  Precautions Precautions: Fall Precaution Comments: LUE bursitis drained 10/17/22 Restrictions Weight Bearing Restrictions: Yes LUE Weight Bearing: Weight bearing as tolerated LLE Weight Bearing: Weight bearing as tolerated Other Position/Activity Restrictions: Per Dr. Annie Main, pt can weight bear through L wrist & forearm, but try to limit weight bearing through L elbow. pt frequently noncompliant with this limitation     Mobility  Bed Mobility Overal bed mobility: Needs Assistance Bed Mobility: Supine to Sit, Sit to Supine     Supine to sit: Min guard Sit to supine: Min guard   General bed mobility comments: to/from R EOB, dense cues for improved body mechanics/technique without railing assist from flat bed; pt apprehensive to attempt but was able to using gait belt as leg lifter and increased time/effort.    Transfers Overall transfer level: Needs assistance Equipment used: Rolling walker (2 wheels) Transfers: Sit to/from Stand Sit to Stand: Modified independent (Device/Increase time), Supervision           General transfer comment: modI for STS from EOB<>RW, close Supervision sitting to/from toilet seat for safety cues, pt reminded to scan environment and use stable surface for lowering/lift assist as she forgot to use wall rail, she has a counter at home she states she can hold for lowering assist    Ambulation/Gait Ambulation/Gait assistance: Supervision Gait Distance (Feet): 120 Feet Assistive device: Rolling walker (2 wheels) Gait Pattern/deviations: Step-through pattern, Decreased step length - right, Decreased step length - left, Decreased stance time - left, Antalgic Gait velocity: Decreased cadence Gait velocity interpretation: <1.8 ft/sec, indicate of risk for recurrent falls   General Gait Details: SpO2 WFL on RA when signal obtained; HR WFL; good RW management, mildly antalgic; encouraged activity pacing/standing breaks with pain/fatigue   Stairs  Stairs:  Yes Stairs assistance: Min assist Stair Management: One rail Left, Step to pattern, Forwards Number of Stairs: 1 General stair comments: cues for step sequencing, HHA on RUE to simulate second rail, at home pt to use RW as she only has one rail; pt anxious but able to perform well with encouragement   Wheelchair Mobility    Modified Rankin (Stroke Patients Only)       Balance Overall balance assessment: Needs assistance Sitting-balance support: No upper extremity supported, Feet supported Sitting balance-Leahy Scale: Normal     Standing balance support: Single extremity supported, During functional activity, Bilateral upper extremity supported Standing balance-Leahy Scale: Fair Standing balance comment: fair static standing unassisted, reliant on RW for weight shifting in stance                            Cognition Arousal/Alertness: Awake/alert Behavior During Therapy: WFL for tasks assessed/performed, Anxious Overall Cognitive Status: Within Functional Limits for tasks assessed                                 General Comments: Pt with anxiety regarding novel tasks (stair ascent, bed mobilty with HOB flat and no railing support), but able to perform with dense cues/max encouragement.        Exercises Other Exercises Other Exercises: HEP, IS and Stairs handouts given to reinforce education    General Comments General comments (skin integrity, edema, etc.): dressings c/d/i; SpO2 and HR WFL      Pertinent Vitals/Pain Pain Assessment Pain Assessment: Faces Faces Pain Scale: Hurts little more Pain Location: LLE with movement Pain Descriptors / Indicators: Discomfort, Grimacing, Guarding Pain Intervention(s): Monitored during session, Repositioned, Ice applied, Premedicated before session    Home Living                          Prior Function            PT Goals (current goals can now be found in the care plan section) Acute  Rehab PT Goals Patient Stated Goal: decreased pain, get better PT Goal Formulation: With patient Time For Goal Achievement: 11/02/22 Progress towards PT goals: Progressing toward goals    Frequency    Min 5X/week      PT Plan Discharge plan needs to be updated    Co-evaluation              AM-PAC PT "6 Clicks" Mobility   Outcome Measure  Help needed turning from your back to your side while in a flat bed without using bedrails?: A Little Help needed moving from lying on your back to sitting on the side of a flat bed without using bedrails?: A Little Help needed moving to and from a bed to a chair (including a wheelchair)?: A Little Help needed standing up from a chair using your arms (e.g., wheelchair or bedside chair)?: A Little Help needed to walk in hospital room?: A Little Help needed climbing 3-5 steps with a railing? : A Lot 6 Click Score: 17    End of Session Equipment Utilized During Treatment: Gait belt Activity Tolerance: Patient tolerated treatment well Patient left: in bed;with call bell/phone within reach;with bed alarm set Nurse Communication: Mobility status PT Visit Diagnosis: Muscle weakness (generalized) (M62.81);Pain;Difficulty in walking, not elsewhere classified (R26.2);Unsteadiness on feet (R26.81) Pain - Right/Left: Left Pain - part  of body: Hip;Leg     Time: 1435-1500 PT Time Calculation (min) (ACUTE ONLY): 25 min  Charges:  $Gait Training: 8-22 mins $Therapeutic Activity: 8-22 mins                     Michaeleen Down P., PTA Acute Rehabilitation Services Secure Chat Preferred 9a-5:30pm Office: [place] 10/26/2022, 3:34 PM

## 2022-10-27 DIAGNOSIS — E041 Nontoxic single thyroid nodule: Secondary | ICD-10-CM | POA: Diagnosis not present

## 2022-10-27 DIAGNOSIS — S72002A Fracture of unspecified part of neck of left femur, initial encounter for closed fracture: Secondary | ICD-10-CM | POA: Diagnosis not present

## 2022-10-27 MED ORDER — HYDROCODONE-ACETAMINOPHEN 5-325 MG PO TABS: 1.0000 | ORAL_TABLET | ORAL | 0 refills | Status: DC | PRN

## 2022-10-27 NOTE — Progress Notes (Signed)
Discharge instructions given to the patient and patient's daughter [name].  Education emphasized on surgical wound care and signs of infection to watch for.  Questions addressed.  Encouraged to call the doctor for concerns or questions.  BSC and walker delivered to the room by [employer].  Discharged home.

## 2022-10-27 NOTE — Discharge Summary (Signed)
Physician Discharge Summary  Samantha Mcbride BOF:[id number] DOB: 1951/02/13 DOA: 10/17/2022  PCP: Antony Contras, MD  Admit date: 10/17/2022 Discharge date: 10/27/2022  Admitted From: home Discharge disposition: home   Recommendations for Outpatient Follow-Up:   Home heath Cbc in AM Continue IS Thyroid U/S   Discharge Diagnosis:   Principal Problem:   Intertrochanteric fracture of left hip ([place]) Active Problems:   Hyperlipidemia associated with type 2 diabetes mellitus ([place])   Obesity (BMI 30-39.9)   H/O Anterior STEMI:  Proximal LAD  insertion of a 3.5x24 mm Promus element DES (08/2011)   CAD S/P percutaneous coronary angioplasty   Essential hypertension   Malignant neoplasm of upper-outer quadrant of right breast in female, estrogen receptor positive (HCC)   Chronic kidney disease, stage 4 (severe) (HCC)   Rheumatoid arthritis (HCC)   COPD (chronic obstructive pulmonary disease) with emphysema ([place]) noted on CT   Hip fracture [facility])    Discharge Condition: Improved.  Diet recommendation: Regular.  Wound care: None.  Code status: Full.   History of Present Illness:   Samantha Mcbride is a 72 y.o. female with medical history significant of CAD status post STEMI and DES, peri-infarct V. tach, COPD, CKD 4, RA, RLS, HLD, hypertension, obesity, breast cancer in remission presenting after fall.   Patient experienced a fall earlier today when her daughter's dog ran into her and knocked her down on a concrete surface.  She suffered a contusion to the back of her head as well as left hip pain.  EMS noted left leg shortening.  Patient is on Plavix.   She denies fevers, chills, chest pain, shortness of breath, abdominal pain, constipation, diarrhea, nausea, vomiting.   Hospital Course by Problem:   Left intertrochanteric hip fracture -appreciate orthopedic surgery follow-up, status post IM nail 10/18/2022 by Dr. Marcelino Scot.  Continue to mobilize with PT Rehab stay denied-  refused SNF-- home health   CKD 4 -her creatinine is overall stable.     Hypokalemia -repleted   Acute blood loss anemia-postoperatively, hemoglobin dipped to 7.8 and has remained stable.  Monitor, no need for transfusions.  Monitor CBC periodically   RA -continue hydroxychloroquine, leflunomide   CAD -no chest pain, this has been stable since her STEMI 11 years ago.  Seeing Dr. Ellyn Hack as an outpatient on a regular basis.  No red flag symptoms, no chest pains, no dyspnea on exertion, no lower extremity swelling   HLD - continue statin, Zetia   HTN -continue carvedilol   History of breast cancer -outpatient follow-up    Medical Consultants:    Ortho CIR  Discharge Exam:   Vitals:   10/27/22 0600 10/27/22 0759  BP: 129/79 121/68  Pulse:  90  Resp:  17  Temp: 98 F (36.7 C) 98 F (36.7 C)  SpO2:  91%   Vitals:   10/26/22 1244 10/26/22 2043 10/27/22 0600 10/27/22 0759  BP: 112/63 131/71 129/79 121/68  Pulse: 90 86  90  Resp: '15 16  17  '$ Temp: (!) 97.4 F (36.3 C) 98 F (36.7 C) 98 F (36.7 C) 98 F (36.7 C)  TempSrc: Oral Oral Oral Oral  SpO2: 93% (!) 88%  91%  Weight:      Height:        General exam: Appears calm and comfortable.    The results of significant diagnostics from this hospitalization (including imaging, microbiology, ancillary and laboratory) are listed below for reference.     Procedures and Diagnostic  Studies:   DG HIP UNILAT WITH PELVIS 2-3 VIEWS LEFT  Result Date: 10/18/2022 CLINICAL DATA:  Postop left hip fracture EXAM: DG HIP (WITH OR WITHOUT PELVIS) 2-3V LEFT COMPARISON:  10/17/2022 FINDINGS: Interval placement of dynamic hip screw. Expected alignment. The extreme distal aspect of the femoral shaft component is not included in the imaging field. Lateral soft tissue gas. Imaging was obtained to aid in treatment. There is some concentric joint space loss of the left hip with osteophyte formation. Osteopenia. Surgical clips overlie the  central pelvis. Moderate colonic stool. IMPRESSION: Acute surgical changes with placement of a dynamic left hip screw and long femoral stem. The femoral component is not completely included in the imaging field. Electronically Signed   By: Jill Side M.D.   On: 10/18/2022 17:58   DG FEMUR MIN 2 VIEWS LEFT  Result Date: 10/18/2022 CLINICAL DATA:  Intertrochanteric fracture of left hip. Intraoperative fluoroscopy. EXAM: LEFT FEMUR 2 VIEWS COMPARISON:  Left femur radiographs 10/17/2022 FINDINGS: Images were performed intraoperatively without the presence of a radiologist. The patient is undergoing long cephalomedullary nail fixation of the previously seen fracture of the distal femoral neck and intertrochanteric region. No hardware complication is seen. Total fluoroscopy images: 9 Total fluoroscopy time: 118 seconds Total dose: Radiation Exposure Index (as provided by the fluoroscopic device): 26.84 mGy air Kerma Please see intraoperative findings for further detail. IMPRESSION: Intertrochanteric fracture of the left femur. Electronically Signed   By: Yvonne Kendall M.D.   On: 10/18/2022 17:21   DG C-Arm 1-60 Min-No Report  Result Date: 10/18/2022 Fluoroscopy was utilized by the requesting physician.  No radiographic interpretation.   DG C-Arm 1-60 Min-No Report  Result Date: 10/18/2022 Fluoroscopy was utilized by the requesting physician.  No radiographic interpretation.   DG Pelvis Portable  Result Date: 10/17/2022 CLINICAL DATA:  Trauma EXAM: PORTABLE PELVIS 1-2 VIEWS COMPARISON:  None Available. FINDINGS: Basicervical to intertrochanteric fracture of the left femur. IMPRESSION: Basicervical to intertrochanteric fracture of the left femur. Electronically Signed   By: Nelson Chimes M.D.   On: 10/17/2022 12:37   DG Chest Port 1 View  Result Date: 10/17/2022 CLINICAL DATA:  Pain after trauma EXAM: PORTABLE CHEST 1 VIEW COMPARISON:  Chest CT 05/18/2021.  X-ray 2013 FINDINGS: Hyperinflation. Enlarged  cardiopericardial silhouette. Tortuous and ectatic aorta. No consolidation, pneumothorax or effusion. No edema. Overlapping cardiac leads. Surgical clips in the right axillary region. IMPRESSION: Hyperinflation.  Mild enlarged heart.  Calcified tortuous aorta Electronically Signed   By: Jill Side M.D.   On: 10/17/2022 12:35   CT HEAD WO CONTRAST  Result Date: 10/17/2022 CLINICAL DATA:  Fall, head injury.  On Plavix EXAM: CT HEAD WITHOUT CONTRAST CT CERVICAL SPINE WITHOUT CONTRAST TECHNIQUE: Multidetector CT imaging of the head and cervical spine was performed following the standard protocol without intravenous contrast. Multiplanar CT image reconstructions of the cervical spine were also generated. RADIATION DOSE REDUCTION: This exam was performed according to the departmental dose-optimization program which includes automated exposure control, adjustment of the mA and/or kV according to patient size and/or use of iterative reconstruction technique. COMPARISON:  None Available. FINDINGS: CT HEAD FINDINGS Brain: Ventricle size normal.  No acute infarct or hemorrhage. 1 cm calcified lesion to the left of the falx in the posterior frontal lobe likely meningioma. No brain edema. Additional 5 mm dural base calcification left frontotemporal lobe may be benign dural calcification or meningioma as well. Patchy white matter hypodensity bilaterally most compatible with chronic microvascular ischemia. Vascular: Negative for  hyperdense vessel Skull: Negative Sinuses/Orbits: Paranasal sinuses clear. Bilateral cataract extraction Other: None CT CERVICAL SPINE FINDINGS Alignment: Mild anterolisthesis C3-4 and C4-5 Skull base and vertebrae: Negative for fracture Soft tissues and spinal canal: Right upper pole thyroid nodule 2.3 cm in diameter. Disc levels: Cervical spondylosis with disc degeneration and facet degeneration. Spinal and foraminal stenosis bilaterally most prominent at C5-6. Upper chest: Visualized lung apices  clear bilaterally Other: None IMPRESSION: 1. No acute intracranial abnormality. Chronic microvascular ischemic change in the white matter. 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 Additional 5 mm calcification left frontotemporal lobe may be benign dural calcification or meningioma. 2. Cervical spondylosis. Negative for cervical fracture. 3. 2.3 cm right thyroid nodule. Recommend thyroid ultrasound. (Ref: J Am Coll Radiol. 2015 Feb;12(2): 143-50). Electronically Signed   By: Franchot Gallo M.D.   On: 10/17/2022 12:34   CT CERVICAL SPINE WO CONTRAST  Result Date: 10/17/2022 CLINICAL DATA:  Fall, head injury.  On Plavix EXAM: CT HEAD WITHOUT CONTRAST CT CERVICAL SPINE WITHOUT CONTRAST TECHNIQUE: Multidetector CT imaging of the head and cervical spine was performed following the standard protocol without intravenous contrast. Multiplanar CT image reconstructions of the cervical spine were also generated. RADIATION DOSE REDUCTION: This exam was performed according to the departmental dose-optimization program which includes automated exposure control, adjustment of the mA and/or kV according to patient size and/or use of iterative reconstruction technique. COMPARISON:  None Available. FINDINGS: CT HEAD FINDINGS Brain: Ventricle size normal.  No acute infarct or hemorrhage. 1 cm calcified lesion to the left of the falx in the posterior frontal lobe likely meningioma. No brain edema. Additional 5 mm dural base calcification left frontotemporal lobe may be benign dural calcification or meningioma as well. Patchy white matter hypodensity bilaterally most compatible with chronic microvascular ischemia. Vascular: Negative for hyperdense vessel Skull: Negative Sinuses/Orbits: Paranasal sinuses clear. Bilateral cataract extraction Other: None CT CERVICAL SPINE FINDINGS Alignment: Mild anterolisthesis C3-4 and C4-5 Skull base and vertebrae: Negative for fracture Soft tissues and spinal canal: Right upper pole  thyroid nodule 2.3 cm in diameter. Disc levels: Cervical spondylosis with disc degeneration and facet degeneration. Spinal and foraminal stenosis bilaterally most prominent at C5-6. Upper chest: Visualized lung apices clear bilaterally Other: None IMPRESSION: 1. No acute intracranial abnormality. Chronic microvascular ischemic change in the white matter. 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 Additional 5 mm calcification left frontotemporal lobe may be benign dural calcification or meningioma. 2. Cervical spondylosis. Negative for cervical fracture. 3. 2.3 cm right thyroid nodule. Recommend thyroid ultrasound. (Ref: J Am Coll Radiol. 2015 Feb;12(2): 143-50). Electronically Signed   By: Franchot Gallo M.D.   On: 10/17/2022 12:34   DG FEMUR PORT 1V LEFT  Result Date: 10/17/2022 CLINICAL DATA:  Blunt trauma.  Pain EXAM: LEFT FEMUR PORTABLE 1 VIEW COMPARISON:  None Available. FINDINGS: There is cortical irregularity along the femoral neck through the intratrochanteric region typically with irregularity along the lesser trochanter. Subtle nondisplaced fracture is possible. Preserved bone mineralization. Degenerative changes of the hip joint with joint space loss and osteophytes. IMPRESSION: Cortical irregularity along the intertrochanteric region and lower femoral neck involving the lesser trochanter worrisome for nondisplaced fracture. Dedicated hip imaging with oblique film could be some use to further delineate Electronically Signed   By: Jill Side M.D.   On: 10/17/2022 12:34     Labs:   Basic Metabolic Panel: Recent Labs  Lab 10/21/22 0313 10/23/22 0401 10/24/22 0316 10/25/22 0352  NA 137 138 137 138  K 3.4* 4.1 4.0 4.0  CL 107 105 105 107  CO2 '23 24 25 25  '$ GLUCOSE 97 110* 106* 103*  BUN 29* 29* 27* 26*  CREATININE 1.92* 1.71* 1.70* 1.68*  CALCIUM 8.5* 8.5* 8.6* 8.4*  MG 2.4 2.3  --   --    GFR Estimated Creatinine Clearance: 28.4 mL/min (A) (by C-G formula based on SCr  of 1.68 mg/dL (H)). Liver Function Tests: Recent Labs  Lab 10/23/22 0401 10/24/22 0316  AST 14* 14*  ALT 5 6  ALKPHOS 49 57  BILITOT 0.4 0.4  PROT 5.4* 5.5*  ALBUMIN 2.5* 2.5*   No results for input(s): "LIPASE", "AMYLASE" in the last 168 hours. No results for input(s): "AMMONIA" in the last 168 hours. Coagulation profile No results for input(s): "INR", "PROTIME" in the last 168 hours.  CBC: Recent Labs  Lab 10/21/22 0313 10/23/22 0401 10/24/22 0316 10/25/22 0352  WBC 9.6 11.8* 10.1 9.4  HGB 7.9* 7.9* 7.8* 7.4*  HCT 25.6* 24.1* 24.5* 23.5*  MCV 93.4 92.0 93.2 92.9  PLT 161 188 211 209   Cardiac Enzymes: No results for input(s): "CKTOTAL", "CKMB", "CKMBINDEX", "TROPONINI" in the last 168 hours. BNP: Invalid input(s): "POCBNP" CBG: No results for input(s): "GLUCAP" in the last 168 hours. D-Dimer No results for input(s): "DDIMER" in the last 72 hours. Hgb A1c No results for input(s): "HGBA1C" in the last 72 hours. Lipid Profile No results for input(s): "CHOL", "HDL", "LDLCALC", "TRIG", "CHOLHDL", "LDLDIRECT" in the last 72 hours. Thyroid function studies No results for input(s): "TSH", "T4TOTAL", "T3FREE", "THYROIDAB" in the last 72 hours.  Invalid input(s): "FREET3" Anemia work up No results for input(s): "VITAMINB12", "FOLATE", "FERRITIN", "TIBC", "IRON", "RETICCTPCT" in the last 72 hours. Microbiology Recent Results (from the past 240 hour(s))  Surgical pcr screen     Status: None   Collection Time: 10/18/22  5:37 AM   Specimen: Nasal Mucosa; Nasal Swab  Result Value Ref Range Status   MRSA, PCR NEGATIVE NEGATIVE Final   Staphylococcus aureus NEGATIVE NEGATIVE Final    Comment: (NOTE) The Xpert SA Assay (FDA approved for NASAL specimens in patients 37 years of age and older), is one component of a comprehensive surveillance program. It is not intended to diagnose infection nor to guide or monitor treatment. Performed at [facility], [place]  [address], [place], [place] [address]      Discharge Instructions:   Discharge Instructions     Diet general   Complete by: As directed    Discharge instructions   Complete by: As directed    Home health   Increase activity slowly   Complete by: As directed       Allergies as of 10/27/2022   No Known Allergies      Medication List     STOP taking these medications    amoxicillin 500 MG tablet Commonly known as: AMOXIL   PATADAY OP   Pfizer COVID-19 Vac Bivalent injection Generic drug: COVID-19 mRNA bivalent vaccine Therapist, music)   Pfizer-BioNTech COVID-19 Vacc 30 MCG/0.3ML injection Generic drug: COVID-19 mRNA vaccine (Pfizer)   promethazine 25 MG suppository Commonly known as: PHENERGAN   tiZANidine 2 MG tablet Commonly known as: ZANAFLEX   traMADol 50 MG tablet Commonly known as: ULTRAM       TAKE these medications    acetaminophen 500 MG tablet Commonly known as: TYLENOL Take 500 mg by mouth every 6 (six) hours as needed for moderate pain or headache.   CALCIUM + D  PO Take 1 tablet by mouth daily.   carvedilol 12.5 MG tablet Commonly known as: COREG Take 12.5 mg by mouth 2 (two) times daily.   clopidogrel 75 MG tablet Commonly known as: PLAVIX TAKE 1 TABLET BY MOUTH EVERY DAY   CORICIDIN HBP PO Take 1 tablet by mouth daily as needed (allergies).   diclofenac sodium 1 % Gel Commonly known as: VOLTAREN Apply 2 g topically 4 (four) times daily as needed (pain).   ezetimibe 10 MG tablet Commonly known as: ZETIA TAKE 1 TABLET BY MOUTH EVERY DAY   gabapentin 100 MG capsule Commonly known as: NEURONTIN TAKE 3 CAPSULES BY MOUTH AT BEDTIME.   HYDROcodone-acetaminophen 5-325 MG tablet Commonly known as: NORCO/VICODIN Take 1-2 tablets by mouth every 4 (four) hours as needed for moderate pain.   hydroxychloroquine 200 MG tablet Commonly known as: Plaquenil Take 1 tablet (200 mg total) by mouth daily.   leflunomide 20 MG tablet Commonly known as:  ARAVA Take 20 mg by mouth daily.   Melatonin 5 MG Caps Take 5 mg by mouth at bedtime.   nitroGLYCERIN 0.4 MG SL tablet Commonly known as: Nitrostat PLACE 1 TABLET UNDER THE TONGUE EVERY 5 MINUTES AS NEEDED FOR CHEST PAIN.   rosuvastatin 20 MG tablet Commonly known as: CRESTOR TAKE 1 TABLET BY MOUTH EVERY DAY   traZODone 100 MG tablet Commonly known as: DESYREL Take 100 mg by mouth at bedtime.               Durable Medical Equipment  (From admission, onward)           Start     Ordered   10/26/22 1454  For home use only DME 3 n 1  Once       Comments: Confined to one room   10/26/22 1453   10/26/22 1240  For home use only DME Walker rolling  Once       Question Answer Comment  Walker: With [place] Wheels   Patient needs a walker to treat with the following condition Hip fracture ([place])      10/26/22 1239            Follow-up Information     Altamese Bethesda, MD. Schedule an appointment as soon as possible for a visit in 2 week(s).   Specialty: Orthopedic Surgery Contact information: [place] [address] [place] Follow up.   Why: Home health PT and  OT services will be provided by [facility], start of care within48 hours post discharge                 Time coordinating discharge: 45 min  Signed:  Geradine Girt DO  [facility] 10/27/2022, 1:18 PM

## 2022-10-27 NOTE — TOC Transition Note (Signed)
Transition of Care [facility]) - CM/SW Discharge Note   Patient Details  Name: Samantha Mcbride MRN: [id number] Date of Birth: Nov 20, 1950  Transition of Care [facility]) CM/SW Contact:  Bartholomew Crews, RN Phone Number: [phone] 10/27/2022, 5:28 PM   Clinical Narrative:     Patient transitioned home today. Previous RNCM arranged for RW and BSC to be delivered bedside and for [facility] to provide [facility] PT. Daughter to provide transportation home. Daughter asked nursing about getting bed rails. RNCM unable to get to bedside at the time, but advised nursing to let daughter know that items would be private pay and that [facility] PT would be doing a safety evaluation to advise of things to consider to enhance safety. No further TOC needs identified at this time.   Final next level of care: [place] Barriers to Discharge: No Barriers Identified   Patient Goals and CMS Choice   Choice offered to / list presented to : Patient  Discharge Placement                         Discharge Plan and Services Additional resources added to the After Visit Summary for                  DME Arranged: 3-N-1, Walker rolling DME Agency: [employer] Date DME Agency Contacted: 10/26/22 Time DME Agency Contacted: [id number] Representative spoke with at DME Agency: [place]: PT, OT [place] Agency: [place] ([place]) Date [place]: 10/26/22 Time [place]: 1355 Representative spoke with at [place]: [place] ([place]) Interventions [place]: No Food Insecurity (10/17/2022)  Housing: Low Risk  (10/17/2022)  Transportation Needs: No Transportation Needs (10/17/2022)  Utilities: Not At Risk (10/17/2022)  Tobacco Use: Medium Risk (10/19/2022)     Readmission Risk Interventions     No data to display

## 2022-10-28 DIAGNOSIS — Z7902 Long term (current) use of antithrombotics/antiplatelets: Secondary | ICD-10-CM | POA: Diagnosis not present

## 2022-10-28 DIAGNOSIS — E785 Hyperlipidemia, unspecified: Secondary | ICD-10-CM | POA: Diagnosis not present

## 2022-10-28 DIAGNOSIS — I2510 Atherosclerotic heart disease of native coronary artery without angina pectoris: Secondary | ICD-10-CM | POA: Diagnosis not present

## 2022-10-28 DIAGNOSIS — Z79891 Long term (current) use of opiate analgesic: Secondary | ICD-10-CM | POA: Diagnosis not present

## 2022-10-28 DIAGNOSIS — J449 Chronic obstructive pulmonary disease, unspecified: Secondary | ICD-10-CM | POA: Diagnosis not present

## 2022-10-28 DIAGNOSIS — I252 Old myocardial infarction: Secondary | ICD-10-CM | POA: Diagnosis not present

## 2022-10-28 DIAGNOSIS — Z7969 Long term (current) use of other immunomodulators and immunosuppressants: Secondary | ICD-10-CM | POA: Diagnosis not present

## 2022-10-28 DIAGNOSIS — I129 Hypertensive chronic kidney disease with stage 1 through stage 4 chronic kidney disease, or unspecified chronic kidney disease: Secondary | ICD-10-CM | POA: Diagnosis not present

## 2022-10-28 DIAGNOSIS — J439 Emphysema, unspecified: Secondary | ICD-10-CM | POA: Diagnosis not present

## 2022-10-28 DIAGNOSIS — M069 Rheumatoid arthritis, unspecified: Secondary | ICD-10-CM | POA: Diagnosis not present

## 2022-10-28 DIAGNOSIS — E1122 Type 2 diabetes mellitus with diabetic chronic kidney disease: Secondary | ICD-10-CM | POA: Diagnosis not present

## 2022-10-28 DIAGNOSIS — Z87891 Personal history of nicotine dependence: Secondary | ICD-10-CM | POA: Diagnosis not present

## 2022-10-28 DIAGNOSIS — E1169 Type 2 diabetes mellitus with other specified complication: Secondary | ICD-10-CM | POA: Diagnosis not present

## 2022-10-28 DIAGNOSIS — S72142D Displaced intertrochanteric fracture of left femur, subsequent encounter for closed fracture with routine healing: Secondary | ICD-10-CM | POA: Diagnosis not present

## 2022-10-28 DIAGNOSIS — Z853 Personal history of malignant neoplasm of breast: Secondary | ICD-10-CM | POA: Diagnosis not present

## 2022-10-28 DIAGNOSIS — Z9181 History of falling: Secondary | ICD-10-CM | POA: Diagnosis not present

## 2022-10-28 DIAGNOSIS — N184 Chronic kidney disease, stage 4 (severe): Secondary | ICD-10-CM | POA: Diagnosis not present

## 2022-10-28 DIAGNOSIS — G2581 Restless legs syndrome: Secondary | ICD-10-CM | POA: Diagnosis not present

## 2022-10-28 DIAGNOSIS — W010XXD Fall on same level from slipping, tripping and stumbling without subsequent striking against object, subsequent encounter: Secondary | ICD-10-CM | POA: Diagnosis not present

## 2022-10-30 DIAGNOSIS — G2581 Restless legs syndrome: Secondary | ICD-10-CM | POA: Diagnosis not present

## 2022-10-30 DIAGNOSIS — Z87891 Personal history of nicotine dependence: Secondary | ICD-10-CM | POA: Diagnosis not present

## 2022-10-30 DIAGNOSIS — S72142D Displaced intertrochanteric fracture of left femur, subsequent encounter for closed fracture with routine healing: Secondary | ICD-10-CM | POA: Diagnosis not present

## 2022-10-30 DIAGNOSIS — Z7902 Long term (current) use of antithrombotics/antiplatelets: Secondary | ICD-10-CM | POA: Diagnosis not present

## 2022-10-30 DIAGNOSIS — I252 Old myocardial infarction: Secondary | ICD-10-CM | POA: Diagnosis not present

## 2022-10-30 DIAGNOSIS — Z79891 Long term (current) use of opiate analgesic: Secondary | ICD-10-CM | POA: Diagnosis not present

## 2022-10-30 DIAGNOSIS — Z7969 Long term (current) use of other immunomodulators and immunosuppressants: Secondary | ICD-10-CM | POA: Diagnosis not present

## 2022-10-30 DIAGNOSIS — E785 Hyperlipidemia, unspecified: Secondary | ICD-10-CM | POA: Diagnosis not present

## 2022-10-30 DIAGNOSIS — E1169 Type 2 diabetes mellitus with other specified complication: Secondary | ICD-10-CM | POA: Diagnosis not present

## 2022-10-30 DIAGNOSIS — W010XXD Fall on same level from slipping, tripping and stumbling without subsequent striking against object, subsequent encounter: Secondary | ICD-10-CM | POA: Diagnosis not present

## 2022-10-30 DIAGNOSIS — E1122 Type 2 diabetes mellitus with diabetic chronic kidney disease: Secondary | ICD-10-CM | POA: Diagnosis not present

## 2022-10-30 DIAGNOSIS — M069 Rheumatoid arthritis, unspecified: Secondary | ICD-10-CM | POA: Diagnosis not present

## 2022-10-30 DIAGNOSIS — N184 Chronic kidney disease, stage 4 (severe): Secondary | ICD-10-CM | POA: Diagnosis not present

## 2022-10-30 DIAGNOSIS — I129 Hypertensive chronic kidney disease with stage 1 through stage 4 chronic kidney disease, or unspecified chronic kidney disease: Secondary | ICD-10-CM | POA: Diagnosis not present

## 2022-10-30 DIAGNOSIS — J439 Emphysema, unspecified: Secondary | ICD-10-CM | POA: Diagnosis not present

## 2022-10-30 DIAGNOSIS — Z9181 History of falling: Secondary | ICD-10-CM | POA: Diagnosis not present

## 2022-10-30 DIAGNOSIS — J449 Chronic obstructive pulmonary disease, unspecified: Secondary | ICD-10-CM | POA: Diagnosis not present

## 2022-10-30 DIAGNOSIS — I2510 Atherosclerotic heart disease of native coronary artery without angina pectoris: Secondary | ICD-10-CM | POA: Diagnosis not present

## 2022-10-30 DIAGNOSIS — Z853 Personal history of malignant neoplasm of breast: Secondary | ICD-10-CM | POA: Diagnosis not present

## 2022-10-31 DIAGNOSIS — I129 Hypertensive chronic kidney disease with stage 1 through stage 4 chronic kidney disease, or unspecified chronic kidney disease: Secondary | ICD-10-CM | POA: Diagnosis not present

## 2022-10-31 DIAGNOSIS — G2581 Restless legs syndrome: Secondary | ICD-10-CM | POA: Diagnosis not present

## 2022-10-31 DIAGNOSIS — Z87891 Personal history of nicotine dependence: Secondary | ICD-10-CM | POA: Diagnosis not present

## 2022-10-31 DIAGNOSIS — Z853 Personal history of malignant neoplasm of breast: Secondary | ICD-10-CM | POA: Diagnosis not present

## 2022-10-31 DIAGNOSIS — I252 Old myocardial infarction: Secondary | ICD-10-CM | POA: Diagnosis not present

## 2022-10-31 DIAGNOSIS — N184 Chronic kidney disease, stage 4 (severe): Secondary | ICD-10-CM | POA: Diagnosis not present

## 2022-10-31 DIAGNOSIS — M069 Rheumatoid arthritis, unspecified: Secondary | ICD-10-CM | POA: Diagnosis not present

## 2022-10-31 DIAGNOSIS — J449 Chronic obstructive pulmonary disease, unspecified: Secondary | ICD-10-CM | POA: Diagnosis not present

## 2022-10-31 DIAGNOSIS — E1122 Type 2 diabetes mellitus with diabetic chronic kidney disease: Secondary | ICD-10-CM | POA: Diagnosis not present

## 2022-10-31 DIAGNOSIS — Z79891 Long term (current) use of opiate analgesic: Secondary | ICD-10-CM | POA: Diagnosis not present

## 2022-10-31 DIAGNOSIS — E785 Hyperlipidemia, unspecified: Secondary | ICD-10-CM | POA: Diagnosis not present

## 2022-10-31 DIAGNOSIS — Z9181 History of falling: Secondary | ICD-10-CM | POA: Diagnosis not present

## 2022-10-31 DIAGNOSIS — W010XXD Fall on same level from slipping, tripping and stumbling without subsequent striking against object, subsequent encounter: Secondary | ICD-10-CM | POA: Diagnosis not present

## 2022-10-31 DIAGNOSIS — Z7902 Long term (current) use of antithrombotics/antiplatelets: Secondary | ICD-10-CM | POA: Diagnosis not present

## 2022-10-31 DIAGNOSIS — J439 Emphysema, unspecified: Secondary | ICD-10-CM | POA: Diagnosis not present

## 2022-10-31 DIAGNOSIS — S72142D Displaced intertrochanteric fracture of left femur, subsequent encounter for closed fracture with routine healing: Secondary | ICD-10-CM | POA: Diagnosis not present

## 2022-10-31 DIAGNOSIS — I2510 Atherosclerotic heart disease of native coronary artery without angina pectoris: Secondary | ICD-10-CM | POA: Diagnosis not present

## 2022-10-31 DIAGNOSIS — Z7969 Long term (current) use of other immunomodulators and immunosuppressants: Secondary | ICD-10-CM | POA: Diagnosis not present

## 2022-10-31 DIAGNOSIS — E1169 Type 2 diabetes mellitus with other specified complication: Secondary | ICD-10-CM | POA: Diagnosis not present

## 2022-11-02 DIAGNOSIS — G2581 Restless legs syndrome: Secondary | ICD-10-CM | POA: Diagnosis not present

## 2022-11-02 DIAGNOSIS — I2510 Atherosclerotic heart disease of native coronary artery without angina pectoris: Secondary | ICD-10-CM | POA: Diagnosis not present

## 2022-11-02 DIAGNOSIS — I129 Hypertensive chronic kidney disease with stage 1 through stage 4 chronic kidney disease, or unspecified chronic kidney disease: Secondary | ICD-10-CM | POA: Diagnosis not present

## 2022-11-02 DIAGNOSIS — J439 Emphysema, unspecified: Secondary | ICD-10-CM | POA: Diagnosis not present

## 2022-11-02 DIAGNOSIS — W010XXD Fall on same level from slipping, tripping and stumbling without subsequent striking against object, subsequent encounter: Secondary | ICD-10-CM | POA: Diagnosis not present

## 2022-11-02 DIAGNOSIS — E1169 Type 2 diabetes mellitus with other specified complication: Secondary | ICD-10-CM | POA: Diagnosis not present

## 2022-11-02 DIAGNOSIS — N184 Chronic kidney disease, stage 4 (severe): Secondary | ICD-10-CM | POA: Diagnosis not present

## 2022-11-02 DIAGNOSIS — S72142D Displaced intertrochanteric fracture of left femur, subsequent encounter for closed fracture with routine healing: Secondary | ICD-10-CM | POA: Diagnosis not present

## 2022-11-02 DIAGNOSIS — E785 Hyperlipidemia, unspecified: Secondary | ICD-10-CM | POA: Diagnosis not present

## 2022-11-02 DIAGNOSIS — Z9181 History of falling: Secondary | ICD-10-CM | POA: Diagnosis not present

## 2022-11-02 DIAGNOSIS — I252 Old myocardial infarction: Secondary | ICD-10-CM | POA: Diagnosis not present

## 2022-11-02 DIAGNOSIS — Z853 Personal history of malignant neoplasm of breast: Secondary | ICD-10-CM | POA: Diagnosis not present

## 2022-11-02 DIAGNOSIS — M069 Rheumatoid arthritis, unspecified: Secondary | ICD-10-CM | POA: Diagnosis not present

## 2022-11-02 DIAGNOSIS — Z87891 Personal history of nicotine dependence: Secondary | ICD-10-CM | POA: Diagnosis not present

## 2022-11-02 DIAGNOSIS — Z7902 Long term (current) use of antithrombotics/antiplatelets: Secondary | ICD-10-CM | POA: Diagnosis not present

## 2022-11-02 DIAGNOSIS — Z79891 Long term (current) use of opiate analgesic: Secondary | ICD-10-CM | POA: Diagnosis not present

## 2022-11-02 DIAGNOSIS — J449 Chronic obstructive pulmonary disease, unspecified: Secondary | ICD-10-CM | POA: Diagnosis not present

## 2022-11-02 DIAGNOSIS — E1122 Type 2 diabetes mellitus with diabetic chronic kidney disease: Secondary | ICD-10-CM | POA: Diagnosis not present

## 2022-11-02 DIAGNOSIS — Z7969 Long term (current) use of other immunomodulators and immunosuppressants: Secondary | ICD-10-CM | POA: Diagnosis not present

## 2022-11-05 DIAGNOSIS — Z79891 Long term (current) use of opiate analgesic: Secondary | ICD-10-CM | POA: Diagnosis not present

## 2022-11-05 DIAGNOSIS — N184 Chronic kidney disease, stage 4 (severe): Secondary | ICD-10-CM | POA: Diagnosis not present

## 2022-11-05 DIAGNOSIS — I2510 Atherosclerotic heart disease of native coronary artery without angina pectoris: Secondary | ICD-10-CM | POA: Diagnosis not present

## 2022-11-05 DIAGNOSIS — Z7969 Long term (current) use of other immunomodulators and immunosuppressants: Secondary | ICD-10-CM | POA: Diagnosis not present

## 2022-11-05 DIAGNOSIS — I129 Hypertensive chronic kidney disease with stage 1 through stage 4 chronic kidney disease, or unspecified chronic kidney disease: Secondary | ICD-10-CM | POA: Diagnosis not present

## 2022-11-05 DIAGNOSIS — W010XXD Fall on same level from slipping, tripping and stumbling without subsequent striking against object, subsequent encounter: Secondary | ICD-10-CM | POA: Diagnosis not present

## 2022-11-05 DIAGNOSIS — J439 Emphysema, unspecified: Secondary | ICD-10-CM | POA: Diagnosis not present

## 2022-11-05 DIAGNOSIS — E1122 Type 2 diabetes mellitus with diabetic chronic kidney disease: Secondary | ICD-10-CM | POA: Diagnosis not present

## 2022-11-05 DIAGNOSIS — Z87891 Personal history of nicotine dependence: Secondary | ICD-10-CM | POA: Diagnosis not present

## 2022-11-05 DIAGNOSIS — Z853 Personal history of malignant neoplasm of breast: Secondary | ICD-10-CM | POA: Diagnosis not present

## 2022-11-05 DIAGNOSIS — E785 Hyperlipidemia, unspecified: Secondary | ICD-10-CM | POA: Diagnosis not present

## 2022-11-05 DIAGNOSIS — Z7902 Long term (current) use of antithrombotics/antiplatelets: Secondary | ICD-10-CM | POA: Diagnosis not present

## 2022-11-05 DIAGNOSIS — Z9181 History of falling: Secondary | ICD-10-CM | POA: Diagnosis not present

## 2022-11-05 DIAGNOSIS — G2581 Restless legs syndrome: Secondary | ICD-10-CM | POA: Diagnosis not present

## 2022-11-05 DIAGNOSIS — J449 Chronic obstructive pulmonary disease, unspecified: Secondary | ICD-10-CM | POA: Diagnosis not present

## 2022-11-05 DIAGNOSIS — S72142D Displaced intertrochanteric fracture of left femur, subsequent encounter for closed fracture with routine healing: Secondary | ICD-10-CM | POA: Diagnosis not present

## 2022-11-05 DIAGNOSIS — I252 Old myocardial infarction: Secondary | ICD-10-CM | POA: Diagnosis not present

## 2022-11-05 DIAGNOSIS — M069 Rheumatoid arthritis, unspecified: Secondary | ICD-10-CM | POA: Diagnosis not present

## 2022-11-05 DIAGNOSIS — E1169 Type 2 diabetes mellitus with other specified complication: Secondary | ICD-10-CM | POA: Diagnosis not present

## 2022-11-09 DIAGNOSIS — Z7902 Long term (current) use of antithrombotics/antiplatelets: Secondary | ICD-10-CM | POA: Diagnosis not present

## 2022-11-09 DIAGNOSIS — I2510 Atherosclerotic heart disease of native coronary artery without angina pectoris: Secondary | ICD-10-CM | POA: Diagnosis not present

## 2022-11-09 DIAGNOSIS — Z79891 Long term (current) use of opiate analgesic: Secondary | ICD-10-CM | POA: Diagnosis not present

## 2022-11-09 DIAGNOSIS — I129 Hypertensive chronic kidney disease with stage 1 through stage 4 chronic kidney disease, or unspecified chronic kidney disease: Secondary | ICD-10-CM | POA: Diagnosis not present

## 2022-11-09 DIAGNOSIS — E785 Hyperlipidemia, unspecified: Secondary | ICD-10-CM | POA: Diagnosis not present

## 2022-11-09 DIAGNOSIS — Z853 Personal history of malignant neoplasm of breast: Secondary | ICD-10-CM | POA: Diagnosis not present

## 2022-11-09 DIAGNOSIS — S72142D Displaced intertrochanteric fracture of left femur, subsequent encounter for closed fracture with routine healing: Secondary | ICD-10-CM | POA: Diagnosis not present

## 2022-11-09 DIAGNOSIS — E1169 Type 2 diabetes mellitus with other specified complication: Secondary | ICD-10-CM | POA: Diagnosis not present

## 2022-11-09 DIAGNOSIS — Z7969 Long term (current) use of other immunomodulators and immunosuppressants: Secondary | ICD-10-CM | POA: Diagnosis not present

## 2022-11-09 DIAGNOSIS — G2581 Restless legs syndrome: Secondary | ICD-10-CM | POA: Diagnosis not present

## 2022-11-09 DIAGNOSIS — M069 Rheumatoid arthritis, unspecified: Secondary | ICD-10-CM | POA: Diagnosis not present

## 2022-11-09 DIAGNOSIS — E1122 Type 2 diabetes mellitus with diabetic chronic kidney disease: Secondary | ICD-10-CM | POA: Diagnosis not present

## 2022-11-09 DIAGNOSIS — N184 Chronic kidney disease, stage 4 (severe): Secondary | ICD-10-CM | POA: Diagnosis not present

## 2022-11-09 DIAGNOSIS — Z9181 History of falling: Secondary | ICD-10-CM | POA: Diagnosis not present

## 2022-11-09 DIAGNOSIS — Z87891 Personal history of nicotine dependence: Secondary | ICD-10-CM | POA: Diagnosis not present

## 2022-11-09 DIAGNOSIS — J439 Emphysema, unspecified: Secondary | ICD-10-CM | POA: Diagnosis not present

## 2022-11-09 DIAGNOSIS — W010XXD Fall on same level from slipping, tripping and stumbling without subsequent striking against object, subsequent encounter: Secondary | ICD-10-CM | POA: Diagnosis not present

## 2022-11-09 DIAGNOSIS — I252 Old myocardial infarction: Secondary | ICD-10-CM | POA: Diagnosis not present

## 2022-11-09 DIAGNOSIS — J449 Chronic obstructive pulmonary disease, unspecified: Secondary | ICD-10-CM | POA: Diagnosis not present

## 2022-11-12 DIAGNOSIS — G2581 Restless legs syndrome: Secondary | ICD-10-CM | POA: Diagnosis not present

## 2022-11-12 DIAGNOSIS — S72142D Displaced intertrochanteric fracture of left femur, subsequent encounter for closed fracture with routine healing: Secondary | ICD-10-CM | POA: Diagnosis not present

## 2022-11-12 DIAGNOSIS — Z7969 Long term (current) use of other immunomodulators and immunosuppressants: Secondary | ICD-10-CM | POA: Diagnosis not present

## 2022-11-12 DIAGNOSIS — N184 Chronic kidney disease, stage 4 (severe): Secondary | ICD-10-CM | POA: Diagnosis not present

## 2022-11-12 DIAGNOSIS — I129 Hypertensive chronic kidney disease with stage 1 through stage 4 chronic kidney disease, or unspecified chronic kidney disease: Secondary | ICD-10-CM | POA: Diagnosis not present

## 2022-11-12 DIAGNOSIS — Z7902 Long term (current) use of antithrombotics/antiplatelets: Secondary | ICD-10-CM | POA: Diagnosis not present

## 2022-11-12 DIAGNOSIS — E785 Hyperlipidemia, unspecified: Secondary | ICD-10-CM | POA: Diagnosis not present

## 2022-11-12 DIAGNOSIS — Z853 Personal history of malignant neoplasm of breast: Secondary | ICD-10-CM | POA: Diagnosis not present

## 2022-11-12 DIAGNOSIS — Z87891 Personal history of nicotine dependence: Secondary | ICD-10-CM | POA: Diagnosis not present

## 2022-11-12 DIAGNOSIS — J449 Chronic obstructive pulmonary disease, unspecified: Secondary | ICD-10-CM | POA: Diagnosis not present

## 2022-11-12 DIAGNOSIS — Z9181 History of falling: Secondary | ICD-10-CM | POA: Diagnosis not present

## 2022-11-12 DIAGNOSIS — Z79891 Long term (current) use of opiate analgesic: Secondary | ICD-10-CM | POA: Diagnosis not present

## 2022-11-12 DIAGNOSIS — E1122 Type 2 diabetes mellitus with diabetic chronic kidney disease: Secondary | ICD-10-CM | POA: Diagnosis not present

## 2022-11-12 DIAGNOSIS — I2510 Atherosclerotic heart disease of native coronary artery without angina pectoris: Secondary | ICD-10-CM | POA: Diagnosis not present

## 2022-11-12 DIAGNOSIS — I252 Old myocardial infarction: Secondary | ICD-10-CM | POA: Diagnosis not present

## 2022-11-12 DIAGNOSIS — J439 Emphysema, unspecified: Secondary | ICD-10-CM | POA: Diagnosis not present

## 2022-11-12 DIAGNOSIS — W010XXD Fall on same level from slipping, tripping and stumbling without subsequent striking against object, subsequent encounter: Secondary | ICD-10-CM | POA: Diagnosis not present

## 2022-11-12 DIAGNOSIS — E1169 Type 2 diabetes mellitus with other specified complication: Secondary | ICD-10-CM | POA: Diagnosis not present

## 2022-11-12 DIAGNOSIS — M069 Rheumatoid arthritis, unspecified: Secondary | ICD-10-CM | POA: Diagnosis not present

## 2022-11-14 DIAGNOSIS — W010XXD Fall on same level from slipping, tripping and stumbling without subsequent striking against object, subsequent encounter: Secondary | ICD-10-CM | POA: Diagnosis not present

## 2022-11-14 DIAGNOSIS — Z853 Personal history of malignant neoplasm of breast: Secondary | ICD-10-CM | POA: Diagnosis not present

## 2022-11-14 DIAGNOSIS — I129 Hypertensive chronic kidney disease with stage 1 through stage 4 chronic kidney disease, or unspecified chronic kidney disease: Secondary | ICD-10-CM | POA: Diagnosis not present

## 2022-11-14 DIAGNOSIS — G2581 Restless legs syndrome: Secondary | ICD-10-CM | POA: Diagnosis not present

## 2022-11-14 DIAGNOSIS — Z7969 Long term (current) use of other immunomodulators and immunosuppressants: Secondary | ICD-10-CM | POA: Diagnosis not present

## 2022-11-14 DIAGNOSIS — N184 Chronic kidney disease, stage 4 (severe): Secondary | ICD-10-CM | POA: Diagnosis not present

## 2022-11-14 DIAGNOSIS — Z9181 History of falling: Secondary | ICD-10-CM | POA: Diagnosis not present

## 2022-11-14 DIAGNOSIS — S72142D Displaced intertrochanteric fracture of left femur, subsequent encounter for closed fracture with routine healing: Secondary | ICD-10-CM | POA: Diagnosis not present

## 2022-11-14 DIAGNOSIS — M069 Rheumatoid arthritis, unspecified: Secondary | ICD-10-CM | POA: Diagnosis not present

## 2022-11-14 DIAGNOSIS — E1169 Type 2 diabetes mellitus with other specified complication: Secondary | ICD-10-CM | POA: Diagnosis not present

## 2022-11-14 DIAGNOSIS — J439 Emphysema, unspecified: Secondary | ICD-10-CM | POA: Diagnosis not present

## 2022-11-14 DIAGNOSIS — E785 Hyperlipidemia, unspecified: Secondary | ICD-10-CM | POA: Diagnosis not present

## 2022-11-14 DIAGNOSIS — Z7902 Long term (current) use of antithrombotics/antiplatelets: Secondary | ICD-10-CM | POA: Diagnosis not present

## 2022-11-14 DIAGNOSIS — Z87891 Personal history of nicotine dependence: Secondary | ICD-10-CM | POA: Diagnosis not present

## 2022-11-14 DIAGNOSIS — E1122 Type 2 diabetes mellitus with diabetic chronic kidney disease: Secondary | ICD-10-CM | POA: Diagnosis not present

## 2022-11-14 DIAGNOSIS — Z79891 Long term (current) use of opiate analgesic: Secondary | ICD-10-CM | POA: Diagnosis not present

## 2022-11-14 DIAGNOSIS — J449 Chronic obstructive pulmonary disease, unspecified: Secondary | ICD-10-CM | POA: Diagnosis not present

## 2022-11-14 DIAGNOSIS — I252 Old myocardial infarction: Secondary | ICD-10-CM | POA: Diagnosis not present

## 2022-11-14 DIAGNOSIS — I2510 Atherosclerotic heart disease of native coronary artery without angina pectoris: Secondary | ICD-10-CM | POA: Diagnosis not present

## 2022-11-18 NOTE — Op Note (Signed)
10/18/2022  6:14 PM  PATIENT:  Samantha Mcbride  08-05-1951 female   MEDICAL RECORD NUMBER: [id number]  PRE-OPERATIVE DIAGNOSIS:  Left Intertroch Fracture  POST-OPERATIVE DIAGNOSIS:  Left Intertroch Fracture  PROCEDURE:  INTRAMEDULLARY NAILING OF THE LEFT HIP using a Biomet Affixus nail.  SURGEON:  Astrid Divine. Marcelino Scot, M.D.  ASSISTANT:  None.  ANESTHESIA:  General.  COMPLICATIONS:  None.  ESTIMATED BLOOD LOSS:  Less than 150 mL.  DISPOSITION:  To PACU.  CONDITION:  Stable.  DELAY START OF DVT PROPHYLAXIS BECAUSE OF BLEEDING RISK: NO  BRIEF SUMMARY AND INDICATION OF PROCEDURE:  Bernis Stecher is a 72 y.o. year- old with multiple medical problems.  I discussed with the patient and family risks and benefits of surgical treatment including the potential for malunion, nonunion, symptomatic hardware, heart attack, stroke, neurovascular injury, bleeding, and others.  After full discussion, the patient and family wished to proceed.  BRIEF SUMMARY OF PROCEDURE:  The patient was taken to the operating room where general anesthesia was induced.  She was positioned supine on the [place] fracture table.  A closed reduction maneuver was performed of the fractured proximal femur and this was confirmed on both AP and lateral xray views. A thorough scrub and wash with chlorhexidine and then Betadine scrub and paint was performed.  After sterile drapes and time-out, a long instrument was used to identify the appropriate starting position under C-arm on both AP and lateral images.  A 3 cm incision was made proximal to the greater trochanter.  The curved cannulated awl was inserted just medial to the tip of the lateral trochanter and then the starting guidewire advanced into the proximal femur.  This was checked on AP and lateral views.  The starting reamer was engaged with the soft tissue protected by a sleeve.  The curved ball-tipped guidewire was then inserted, making sure it was just posterior as  possible in the distal femur and across the fracture site, which stayed in a reduced position.  It was sequentially reamed up to 13 mm and an 11 x 320 mm nail inserted to the appropriate depth.  The guidewire for the lag screw was then inserted with the appropriate anteversion to make sure it was in a center-center position.  This was measured and the lag screw placed with excellent purchase and position checked on both views.  The antirotation screw was Placed as well because of potential for malrotation. The set screw was then engaged within the groove of the lag screw, which was allowed to telescope.  Traction was released and compression achieved with the screw.  This was followed by placement of one distal locking screw using perfect circle technique.  This was confirmed on AP and lateral images. Wounds were irrigated thoroughly, closed in a standard layered fashion. Sterile gently compressive dressings were applied. The patient was awakened from anesthesia and transported to the PACU in stable condition.  PROGNOSIS:  The patient will be weightbearing as tolerated with physical therapy beginning DVT prophylaxis tomorrow.  She has no range of motion precautions.  We will continue to follow through at the hospital.  Anticipate follow up in the office in 2 weeks for removal of sutures and further evaluation.     Astrid Divine. Marcelino Scot, M.D.

## 2022-11-20 DIAGNOSIS — M069 Rheumatoid arthritis, unspecified: Secondary | ICD-10-CM | POA: Diagnosis not present

## 2022-11-20 DIAGNOSIS — G2581 Restless legs syndrome: Secondary | ICD-10-CM | POA: Diagnosis not present

## 2022-11-20 DIAGNOSIS — I2510 Atherosclerotic heart disease of native coronary artery without angina pectoris: Secondary | ICD-10-CM | POA: Diagnosis not present

## 2022-11-20 DIAGNOSIS — E785 Hyperlipidemia, unspecified: Secondary | ICD-10-CM | POA: Diagnosis not present

## 2022-11-20 DIAGNOSIS — I129 Hypertensive chronic kidney disease with stage 1 through stage 4 chronic kidney disease, or unspecified chronic kidney disease: Secondary | ICD-10-CM | POA: Diagnosis not present

## 2022-11-20 DIAGNOSIS — I252 Old myocardial infarction: Secondary | ICD-10-CM | POA: Diagnosis not present

## 2022-11-20 DIAGNOSIS — W010XXD Fall on same level from slipping, tripping and stumbling without subsequent striking against object, subsequent encounter: Secondary | ICD-10-CM | POA: Diagnosis not present

## 2022-11-20 DIAGNOSIS — Z9181 History of falling: Secondary | ICD-10-CM | POA: Diagnosis not present

## 2022-11-20 DIAGNOSIS — E1122 Type 2 diabetes mellitus with diabetic chronic kidney disease: Secondary | ICD-10-CM | POA: Diagnosis not present

## 2022-11-20 DIAGNOSIS — J439 Emphysema, unspecified: Secondary | ICD-10-CM | POA: Diagnosis not present

## 2022-11-20 DIAGNOSIS — N184 Chronic kidney disease, stage 4 (severe): Secondary | ICD-10-CM | POA: Diagnosis not present

## 2022-11-20 DIAGNOSIS — Z7969 Long term (current) use of other immunomodulators and immunosuppressants: Secondary | ICD-10-CM | POA: Diagnosis not present

## 2022-11-20 DIAGNOSIS — Z87891 Personal history of nicotine dependence: Secondary | ICD-10-CM | POA: Diagnosis not present

## 2022-11-20 DIAGNOSIS — Z7902 Long term (current) use of antithrombotics/antiplatelets: Secondary | ICD-10-CM | POA: Diagnosis not present

## 2022-11-20 DIAGNOSIS — J449 Chronic obstructive pulmonary disease, unspecified: Secondary | ICD-10-CM | POA: Diagnosis not present

## 2022-11-20 DIAGNOSIS — E1169 Type 2 diabetes mellitus with other specified complication: Secondary | ICD-10-CM | POA: Diagnosis not present

## 2022-11-20 DIAGNOSIS — Z79891 Long term (current) use of opiate analgesic: Secondary | ICD-10-CM | POA: Diagnosis not present

## 2022-11-20 DIAGNOSIS — Z853 Personal history of malignant neoplasm of breast: Secondary | ICD-10-CM | POA: Diagnosis not present

## 2022-11-20 DIAGNOSIS — S72142D Displaced intertrochanteric fracture of left femur, subsequent encounter for closed fracture with routine healing: Secondary | ICD-10-CM | POA: Diagnosis not present

## 2022-11-22 DIAGNOSIS — S72142D Displaced intertrochanteric fracture of left femur, subsequent encounter for closed fracture with routine healing: Secondary | ICD-10-CM | POA: Diagnosis not present

## 2022-11-22 DIAGNOSIS — I2510 Atherosclerotic heart disease of native coronary artery without angina pectoris: Secondary | ICD-10-CM | POA: Diagnosis not present

## 2022-11-22 DIAGNOSIS — Z853 Personal history of malignant neoplasm of breast: Secondary | ICD-10-CM | POA: Diagnosis not present

## 2022-11-22 DIAGNOSIS — W010XXD Fall on same level from slipping, tripping and stumbling without subsequent striking against object, subsequent encounter: Secondary | ICD-10-CM | POA: Diagnosis not present

## 2022-11-22 DIAGNOSIS — Z7902 Long term (current) use of antithrombotics/antiplatelets: Secondary | ICD-10-CM | POA: Diagnosis not present

## 2022-11-22 DIAGNOSIS — E785 Hyperlipidemia, unspecified: Secondary | ICD-10-CM | POA: Diagnosis not present

## 2022-11-22 DIAGNOSIS — Z7969 Long term (current) use of other immunomodulators and immunosuppressants: Secondary | ICD-10-CM | POA: Diagnosis not present

## 2022-11-22 DIAGNOSIS — E1169 Type 2 diabetes mellitus with other specified complication: Secondary | ICD-10-CM | POA: Diagnosis not present

## 2022-11-22 DIAGNOSIS — N184 Chronic kidney disease, stage 4 (severe): Secondary | ICD-10-CM | POA: Diagnosis not present

## 2022-11-22 DIAGNOSIS — J449 Chronic obstructive pulmonary disease, unspecified: Secondary | ICD-10-CM | POA: Diagnosis not present

## 2022-11-22 DIAGNOSIS — M069 Rheumatoid arthritis, unspecified: Secondary | ICD-10-CM | POA: Diagnosis not present

## 2022-11-22 DIAGNOSIS — Z9181 History of falling: Secondary | ICD-10-CM | POA: Diagnosis not present

## 2022-11-22 DIAGNOSIS — I129 Hypertensive chronic kidney disease with stage 1 through stage 4 chronic kidney disease, or unspecified chronic kidney disease: Secondary | ICD-10-CM | POA: Diagnosis not present

## 2022-11-22 DIAGNOSIS — G2581 Restless legs syndrome: Secondary | ICD-10-CM | POA: Diagnosis not present

## 2022-11-22 DIAGNOSIS — E1122 Type 2 diabetes mellitus with diabetic chronic kidney disease: Secondary | ICD-10-CM | POA: Diagnosis not present

## 2022-11-22 DIAGNOSIS — J439 Emphysema, unspecified: Secondary | ICD-10-CM | POA: Diagnosis not present

## 2022-11-22 DIAGNOSIS — Z87891 Personal history of nicotine dependence: Secondary | ICD-10-CM | POA: Diagnosis not present

## 2022-11-22 DIAGNOSIS — I252 Old myocardial infarction: Secondary | ICD-10-CM | POA: Diagnosis not present

## 2022-11-22 DIAGNOSIS — Z79891 Long term (current) use of opiate analgesic: Secondary | ICD-10-CM | POA: Diagnosis not present

## 2022-11-26 DIAGNOSIS — N184 Chronic kidney disease, stage 4 (severe): Secondary | ICD-10-CM | POA: Diagnosis not present

## 2022-11-26 DIAGNOSIS — Z87891 Personal history of nicotine dependence: Secondary | ICD-10-CM | POA: Diagnosis not present

## 2022-11-26 DIAGNOSIS — I129 Hypertensive chronic kidney disease with stage 1 through stage 4 chronic kidney disease, or unspecified chronic kidney disease: Secondary | ICD-10-CM | POA: Diagnosis not present

## 2022-11-26 DIAGNOSIS — S72142D Displaced intertrochanteric fracture of left femur, subsequent encounter for closed fracture with routine healing: Secondary | ICD-10-CM | POA: Diagnosis not present

## 2022-11-26 DIAGNOSIS — M069 Rheumatoid arthritis, unspecified: Secondary | ICD-10-CM | POA: Diagnosis not present

## 2022-11-26 DIAGNOSIS — G2581 Restless legs syndrome: Secondary | ICD-10-CM | POA: Diagnosis not present

## 2022-11-26 DIAGNOSIS — E785 Hyperlipidemia, unspecified: Secondary | ICD-10-CM | POA: Diagnosis not present

## 2022-11-26 DIAGNOSIS — Z7969 Long term (current) use of other immunomodulators and immunosuppressants: Secondary | ICD-10-CM | POA: Diagnosis not present

## 2022-11-26 DIAGNOSIS — Z853 Personal history of malignant neoplasm of breast: Secondary | ICD-10-CM | POA: Diagnosis not present

## 2022-11-26 DIAGNOSIS — J439 Emphysema, unspecified: Secondary | ICD-10-CM | POA: Diagnosis not present

## 2022-11-26 DIAGNOSIS — E1169 Type 2 diabetes mellitus with other specified complication: Secondary | ICD-10-CM | POA: Diagnosis not present

## 2022-11-26 DIAGNOSIS — W010XXD Fall on same level from slipping, tripping and stumbling without subsequent striking against object, subsequent encounter: Secondary | ICD-10-CM | POA: Diagnosis not present

## 2022-11-26 DIAGNOSIS — I2510 Atherosclerotic heart disease of native coronary artery without angina pectoris: Secondary | ICD-10-CM | POA: Diagnosis not present

## 2022-11-26 DIAGNOSIS — E1122 Type 2 diabetes mellitus with diabetic chronic kidney disease: Secondary | ICD-10-CM | POA: Diagnosis not present

## 2022-11-26 DIAGNOSIS — Z79891 Long term (current) use of opiate analgesic: Secondary | ICD-10-CM | POA: Diagnosis not present

## 2022-11-26 DIAGNOSIS — Z7902 Long term (current) use of antithrombotics/antiplatelets: Secondary | ICD-10-CM | POA: Diagnosis not present

## 2022-11-26 DIAGNOSIS — J449 Chronic obstructive pulmonary disease, unspecified: Secondary | ICD-10-CM | POA: Diagnosis not present

## 2022-11-26 DIAGNOSIS — Z9181 History of falling: Secondary | ICD-10-CM | POA: Diagnosis not present

## 2022-11-26 DIAGNOSIS — I252 Old myocardial infarction: Secondary | ICD-10-CM | POA: Diagnosis not present

## 2022-11-30 DIAGNOSIS — M069 Rheumatoid arthritis, unspecified: Secondary | ICD-10-CM | POA: Diagnosis not present

## 2022-11-30 DIAGNOSIS — Z853 Personal history of malignant neoplasm of breast: Secondary | ICD-10-CM | POA: Diagnosis not present

## 2022-11-30 DIAGNOSIS — I2510 Atherosclerotic heart disease of native coronary artery without angina pectoris: Secondary | ICD-10-CM | POA: Diagnosis not present

## 2022-11-30 DIAGNOSIS — I252 Old myocardial infarction: Secondary | ICD-10-CM | POA: Diagnosis not present

## 2022-11-30 DIAGNOSIS — N184 Chronic kidney disease, stage 4 (severe): Secondary | ICD-10-CM | POA: Diagnosis not present

## 2022-11-30 DIAGNOSIS — J449 Chronic obstructive pulmonary disease, unspecified: Secondary | ICD-10-CM | POA: Diagnosis not present

## 2022-11-30 DIAGNOSIS — Z7969 Long term (current) use of other immunomodulators and immunosuppressants: Secondary | ICD-10-CM | POA: Diagnosis not present

## 2022-11-30 DIAGNOSIS — W010XXD Fall on same level from slipping, tripping and stumbling without subsequent striking against object, subsequent encounter: Secondary | ICD-10-CM | POA: Diagnosis not present

## 2022-11-30 DIAGNOSIS — I129 Hypertensive chronic kidney disease with stage 1 through stage 4 chronic kidney disease, or unspecified chronic kidney disease: Secondary | ICD-10-CM | POA: Diagnosis not present

## 2022-11-30 DIAGNOSIS — Z87891 Personal history of nicotine dependence: Secondary | ICD-10-CM | POA: Diagnosis not present

## 2022-11-30 DIAGNOSIS — Z79891 Long term (current) use of opiate analgesic: Secondary | ICD-10-CM | POA: Diagnosis not present

## 2022-11-30 DIAGNOSIS — G2581 Restless legs syndrome: Secondary | ICD-10-CM | POA: Diagnosis not present

## 2022-11-30 DIAGNOSIS — Z9181 History of falling: Secondary | ICD-10-CM | POA: Diagnosis not present

## 2022-11-30 DIAGNOSIS — E785 Hyperlipidemia, unspecified: Secondary | ICD-10-CM | POA: Diagnosis not present

## 2022-11-30 DIAGNOSIS — E1169 Type 2 diabetes mellitus with other specified complication: Secondary | ICD-10-CM | POA: Diagnosis not present

## 2022-11-30 DIAGNOSIS — E1122 Type 2 diabetes mellitus with diabetic chronic kidney disease: Secondary | ICD-10-CM | POA: Diagnosis not present

## 2022-11-30 DIAGNOSIS — Z7902 Long term (current) use of antithrombotics/antiplatelets: Secondary | ICD-10-CM | POA: Diagnosis not present

## 2022-11-30 DIAGNOSIS — S72142D Displaced intertrochanteric fracture of left femur, subsequent encounter for closed fracture with routine healing: Secondary | ICD-10-CM | POA: Diagnosis not present

## 2022-11-30 DIAGNOSIS — J439 Emphysema, unspecified: Secondary | ICD-10-CM | POA: Diagnosis not present

## 2022-12-03 DIAGNOSIS — E1122 Type 2 diabetes mellitus with diabetic chronic kidney disease: Secondary | ICD-10-CM | POA: Diagnosis not present

## 2022-12-03 DIAGNOSIS — G2581 Restless legs syndrome: Secondary | ICD-10-CM | POA: Diagnosis not present

## 2022-12-03 DIAGNOSIS — I129 Hypertensive chronic kidney disease with stage 1 through stage 4 chronic kidney disease, or unspecified chronic kidney disease: Secondary | ICD-10-CM | POA: Diagnosis not present

## 2022-12-03 DIAGNOSIS — J449 Chronic obstructive pulmonary disease, unspecified: Secondary | ICD-10-CM | POA: Diagnosis not present

## 2022-12-03 DIAGNOSIS — J439 Emphysema, unspecified: Secondary | ICD-10-CM | POA: Diagnosis not present

## 2022-12-03 DIAGNOSIS — I252 Old myocardial infarction: Secondary | ICD-10-CM | POA: Diagnosis not present

## 2022-12-03 DIAGNOSIS — Z87891 Personal history of nicotine dependence: Secondary | ICD-10-CM | POA: Diagnosis not present

## 2022-12-03 DIAGNOSIS — S72142D Displaced intertrochanteric fracture of left femur, subsequent encounter for closed fracture with routine healing: Secondary | ICD-10-CM | POA: Diagnosis not present

## 2022-12-03 DIAGNOSIS — E1169 Type 2 diabetes mellitus with other specified complication: Secondary | ICD-10-CM | POA: Diagnosis not present

## 2022-12-03 DIAGNOSIS — W010XXD Fall on same level from slipping, tripping and stumbling without subsequent striking against object, subsequent encounter: Secondary | ICD-10-CM | POA: Diagnosis not present

## 2022-12-03 DIAGNOSIS — Z79891 Long term (current) use of opiate analgesic: Secondary | ICD-10-CM | POA: Diagnosis not present

## 2022-12-03 DIAGNOSIS — M069 Rheumatoid arthritis, unspecified: Secondary | ICD-10-CM | POA: Diagnosis not present

## 2022-12-03 DIAGNOSIS — Z7902 Long term (current) use of antithrombotics/antiplatelets: Secondary | ICD-10-CM | POA: Diagnosis not present

## 2022-12-03 DIAGNOSIS — Z853 Personal history of malignant neoplasm of breast: Secondary | ICD-10-CM | POA: Diagnosis not present

## 2022-12-03 DIAGNOSIS — Z9181 History of falling: Secondary | ICD-10-CM | POA: Diagnosis not present

## 2022-12-03 DIAGNOSIS — Z7969 Long term (current) use of other immunomodulators and immunosuppressants: Secondary | ICD-10-CM | POA: Diagnosis not present

## 2022-12-03 DIAGNOSIS — E785 Hyperlipidemia, unspecified: Secondary | ICD-10-CM | POA: Diagnosis not present

## 2022-12-03 DIAGNOSIS — N184 Chronic kidney disease, stage 4 (severe): Secondary | ICD-10-CM | POA: Diagnosis not present

## 2022-12-03 DIAGNOSIS — I2510 Atherosclerotic heart disease of native coronary artery without angina pectoris: Secondary | ICD-10-CM | POA: Diagnosis not present

## 2022-12-06 DIAGNOSIS — M5136 Other intervertebral disc degeneration, lumbar region: Secondary | ICD-10-CM | POA: Diagnosis not present

## 2022-12-06 DIAGNOSIS — M5416 Radiculopathy, lumbar region: Secondary | ICD-10-CM | POA: Diagnosis not present

## 2022-12-07 ENCOUNTER — Telehealth: Payer: Self-pay

## 2022-12-07 DIAGNOSIS — G2581 Restless legs syndrome: Secondary | ICD-10-CM | POA: Diagnosis not present

## 2022-12-07 DIAGNOSIS — Z7902 Long term (current) use of antithrombotics/antiplatelets: Secondary | ICD-10-CM | POA: Diagnosis not present

## 2022-12-07 DIAGNOSIS — I252 Old myocardial infarction: Secondary | ICD-10-CM | POA: Diagnosis not present

## 2022-12-07 DIAGNOSIS — W010XXD Fall on same level from slipping, tripping and stumbling without subsequent striking against object, subsequent encounter: Secondary | ICD-10-CM | POA: Diagnosis not present

## 2022-12-07 DIAGNOSIS — J449 Chronic obstructive pulmonary disease, unspecified: Secondary | ICD-10-CM | POA: Diagnosis not present

## 2022-12-07 DIAGNOSIS — J439 Emphysema, unspecified: Secondary | ICD-10-CM | POA: Diagnosis not present

## 2022-12-07 DIAGNOSIS — S72142D Displaced intertrochanteric fracture of left femur, subsequent encounter for closed fracture with routine healing: Secondary | ICD-10-CM | POA: Diagnosis not present

## 2022-12-07 DIAGNOSIS — I129 Hypertensive chronic kidney disease with stage 1 through stage 4 chronic kidney disease, or unspecified chronic kidney disease: Secondary | ICD-10-CM | POA: Diagnosis not present

## 2022-12-07 DIAGNOSIS — I2510 Atherosclerotic heart disease of native coronary artery without angina pectoris: Secondary | ICD-10-CM | POA: Diagnosis not present

## 2022-12-07 DIAGNOSIS — M069 Rheumatoid arthritis, unspecified: Secondary | ICD-10-CM | POA: Diagnosis not present

## 2022-12-07 DIAGNOSIS — Z79891 Long term (current) use of opiate analgesic: Secondary | ICD-10-CM | POA: Diagnosis not present

## 2022-12-07 DIAGNOSIS — E785 Hyperlipidemia, unspecified: Secondary | ICD-10-CM | POA: Diagnosis not present

## 2022-12-07 DIAGNOSIS — N184 Chronic kidney disease, stage 4 (severe): Secondary | ICD-10-CM | POA: Diagnosis not present

## 2022-12-07 DIAGNOSIS — Z7969 Long term (current) use of other immunomodulators and immunosuppressants: Secondary | ICD-10-CM | POA: Diagnosis not present

## 2022-12-07 DIAGNOSIS — Z853 Personal history of malignant neoplasm of breast: Secondary | ICD-10-CM | POA: Diagnosis not present

## 2022-12-07 DIAGNOSIS — E1169 Type 2 diabetes mellitus with other specified complication: Secondary | ICD-10-CM | POA: Diagnosis not present

## 2022-12-07 DIAGNOSIS — Z87891 Personal history of nicotine dependence: Secondary | ICD-10-CM | POA: Diagnosis not present

## 2022-12-07 DIAGNOSIS — E1122 Type 2 diabetes mellitus with diabetic chronic kidney disease: Secondary | ICD-10-CM | POA: Diagnosis not present

## 2022-12-07 DIAGNOSIS — Z9181 History of falling: Secondary | ICD-10-CM | POA: Diagnosis not present

## 2022-12-07 NOTE — Telephone Encounter (Signed)
   Pre-operative Risk Assessment    Patient Name: Taea Delange  DOB: July 02, 1951 MRN: [id number]      Request for Surgical Clearance    Procedure:   L3-4 ESI  Date of Surgery:  Clearance TBD                                 Surgeon:  Dr. Lenord Carbo  Surgeon's Group or Practice Name:  [facility]  NeuroSurgery & Spine Phone number:  [phone]  Fax number:  [phone]   Type of Clearance Requested:   - Pharmacy:  Hold Clopidogrel (Plavix) 7 days prior to procedure. May resume the day after procedure.   Type of Anesthesia:  Not Indicated   Additional requests/questions:    Signed, Sung Amabile   12/07/2022, 8:12 AM

## 2022-12-07 NOTE — Telephone Encounter (Signed)
   Patient Name: Samantha Mcbride  DOB: 1951-08-25 MRN: [id number]  Primary Cardiologist: Glenetta Hew, MD  Chart reviewed as part of pre-operative protocol coverage.   Per office protocol, she may hold Plavix for 7 days prior to procedure. Please resume Plavix as soon as possible postprocedure, at the discretion of the surgeon.   I will route this recommendation to the requesting party via Epic fax function and remove from pre-op pool.  Please call with questions.  Lenna Sciara, NP 12/07/2022, 9:01 AM

## 2022-12-12 DIAGNOSIS — S72142D Displaced intertrochanteric fracture of left femur, subsequent encounter for closed fracture with routine healing: Secondary | ICD-10-CM | POA: Diagnosis not present

## 2022-12-20 DIAGNOSIS — N139 Obstructive and reflux uropathy, unspecified: Secondary | ICD-10-CM | POA: Diagnosis not present

## 2022-12-20 DIAGNOSIS — I129 Hypertensive chronic kidney disease with stage 1 through stage 4 chronic kidney disease, or unspecified chronic kidney disease: Secondary | ICD-10-CM | POA: Diagnosis not present

## 2022-12-20 DIAGNOSIS — N2581 Secondary hyperparathyroidism of renal origin: Secondary | ICD-10-CM | POA: Diagnosis not present

## 2022-12-20 DIAGNOSIS — C50919 Malignant neoplasm of unspecified site of unspecified female breast: Secondary | ICD-10-CM | POA: Diagnosis not present

## 2022-12-20 DIAGNOSIS — N189 Chronic kidney disease, unspecified: Secondary | ICD-10-CM | POA: Diagnosis not present

## 2022-12-20 DIAGNOSIS — N184 Chronic kidney disease, stage 4 (severe): Secondary | ICD-10-CM | POA: Diagnosis not present

## 2022-12-20 DIAGNOSIS — M069 Rheumatoid arthritis, unspecified: Secondary | ICD-10-CM | POA: Diagnosis not present

## 2022-12-20 DIAGNOSIS — D631 Anemia in chronic kidney disease: Secondary | ICD-10-CM | POA: Diagnosis not present

## 2022-12-21 DIAGNOSIS — M5416 Radiculopathy, lumbar region: Secondary | ICD-10-CM | POA: Diagnosis not present

## 2022-12-27 ENCOUNTER — Other Ambulatory Visit: Payer: Self-pay | Admitting: Adult Health

## 2022-12-27 DIAGNOSIS — Z1231 Encounter for screening mammogram for malignant neoplasm of breast: Secondary | ICD-10-CM

## 2023-01-01 DIAGNOSIS — M25561 Pain in right knee: Secondary | ICD-10-CM | POA: Diagnosis not present

## 2023-01-01 DIAGNOSIS — N1832 Chronic kidney disease, stage 3b: Secondary | ICD-10-CM | POA: Diagnosis not present

## 2023-01-01 DIAGNOSIS — M549 Dorsalgia, unspecified: Secondary | ICD-10-CM | POA: Diagnosis not present

## 2023-01-01 DIAGNOSIS — M810 Age-related osteoporosis without current pathological fracture: Secondary | ICD-10-CM | POA: Diagnosis not present

## 2023-01-01 DIAGNOSIS — Z79899 Other long term (current) drug therapy: Secondary | ICD-10-CM | POA: Diagnosis not present

## 2023-01-01 DIAGNOSIS — M1990 Unspecified osteoarthritis, unspecified site: Secondary | ICD-10-CM | POA: Diagnosis not present

## 2023-01-01 DIAGNOSIS — M79643 Pain in unspecified hand: Secondary | ICD-10-CM | POA: Diagnosis not present

## 2023-01-01 DIAGNOSIS — M0579 Rheumatoid arthritis with rheumatoid factor of multiple sites without organ or systems involvement: Secondary | ICD-10-CM | POA: Diagnosis not present

## 2023-01-01 DIAGNOSIS — D649 Anemia, unspecified: Secondary | ICD-10-CM | POA: Diagnosis not present

## 2023-01-15 DIAGNOSIS — E782 Mixed hyperlipidemia: Secondary | ICD-10-CM | POA: Diagnosis not present

## 2023-01-15 DIAGNOSIS — G2581 Restless legs syndrome: Secondary | ICD-10-CM | POA: Diagnosis not present

## 2023-01-15 DIAGNOSIS — M069 Rheumatoid arthritis, unspecified: Secondary | ICD-10-CM | POA: Diagnosis not present

## 2023-01-15 DIAGNOSIS — M5416 Radiculopathy, lumbar region: Secondary | ICD-10-CM | POA: Diagnosis not present

## 2023-01-15 DIAGNOSIS — E559 Vitamin D deficiency, unspecified: Secondary | ICD-10-CM | POA: Diagnosis not present

## 2023-01-15 DIAGNOSIS — I25119 Atherosclerotic heart disease of native coronary artery with unspecified angina pectoris: Secondary | ICD-10-CM | POA: Diagnosis not present

## 2023-01-15 DIAGNOSIS — M810 Age-related osteoporosis without current pathological fracture: Secondary | ICD-10-CM | POA: Diagnosis not present

## 2023-01-15 DIAGNOSIS — J432 Centrilobular emphysema: Secondary | ICD-10-CM | POA: Diagnosis not present

## 2023-01-15 DIAGNOSIS — Z0000 Encounter for general adult medical examination without abnormal findings: Secondary | ICD-10-CM | POA: Diagnosis not present

## 2023-01-15 DIAGNOSIS — N184 Chronic kidney disease, stage 4 (severe): Secondary | ICD-10-CM | POA: Diagnosis not present

## 2023-01-16 ENCOUNTER — Other Ambulatory Visit: Payer: Self-pay | Admitting: Family Medicine

## 2023-01-16 DIAGNOSIS — M810 Age-related osteoporosis without current pathological fracture: Secondary | ICD-10-CM

## 2023-01-16 DIAGNOSIS — S72142D Displaced intertrochanteric fracture of left femur, subsequent encounter for closed fracture with routine healing: Secondary | ICD-10-CM | POA: Diagnosis not present

## 2023-01-21 ENCOUNTER — Ambulatory Visit: Payer: Medicare Other | Attending: Hematology and Oncology

## 2023-01-21 VITALS — Wt 141.5 lb

## 2023-01-21 DIAGNOSIS — Z483 Aftercare following surgery for neoplasm: Secondary | ICD-10-CM | POA: Insufficient documentation

## 2023-01-21 NOTE — Therapy (Signed)
OUTPATIENT PHYSICAL THERAPY SOZO SCREENING NOTE   Patient Name: Samantha Mcbride MRN: [id number] DOB:May 30, 1951, 72 y.o., female Today's Date: 01/21/2023  PCP: Tally Joe, MD REFERRING PROVIDER: Serena Croissant, MD   PT End of Session - 01/21/23 8329     Visit Number 5   # unchanged due to screen only   PT Start Time 0905    PT Stop Time 0910    PT Time Calculation (min) 5 min    Activity Tolerance Patient tolerated treatment well    Behavior During Therapy [facility] for tasks assessed/performed             Past Medical History:  Diagnosis Date   Anemia    iron deficiency anemia    CAD S/P percutaneous coronary angioplasty 08/2011   Promus DES - mid LAD 3.5 mm x 24 mm (3.72 mm)    Cancer (HCC)    Chronic kidney disease    stage 4 kidney disease per pt dx 02/2019   Complication of anesthesia    Dyslipidemia, goal LDL below 70     on statin, close to goal   Family history of breast cancer    Family history of skin cancer    Former moderate cigarette smoker (10-19 per day)    Glucose intolerance (impaired glucose tolerance)    H/O thyroid nodule    Benign   History 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 --> Promus DES =>  EF 40 to 45% (similar to 2012) stable wall motion normality (severe HK of midapical anterior apical wall-consistent with prior infarct).  GRII DD.  Normal valves. => Stable compared to 08/30/2011.  EF was still 40 to 45%.   Hypertension    Personal history of radiation therapy    PONV (postoperative nausea and vomiting)    no issues with surgery on 05-11-2019   Rheumatoid arthritis (HCC)    managed on humira    SOB (shortness of breath) on exertion    reports "its been that way since my heart attack " repots no recurrence of MI sx since that time    Past Surgical History:  Procedure Laterality Date   ABDOMINAL AORTAGRAM N/A 08/27/2011   Procedure: ABDOMINAL Ronny Flurry;   Surgeon: Lennette Bihari, MD;  Location: [facility] CATH LAB;  Service: Cardiovascular;  Laterality: N/A;   ANKLE SURGERY Right 1995   per pt ankle surgery here at [facility]    APPENDECTOMY     BREAST EXCISIONAL BIOPSY Left    BREAST LUMPECTOMY Right 07/2020   BREAST LUMPECTOMY WITH RADIOACTIVE SEED AND SENTINEL LYMPH NODE BIOPSY Right 08/05/2020   Procedure: RIGHT BREAST LUMPECTOMY WITH RADIOACTIVE SEED AND SENTINEL LYMPH NODE BIOPSY;  Surgeon: Griselda Miner, MD;  Location: [facility] OR;  Service: General;  Laterality: Right;   CYSTOSCOPY WITH RETROGRADE PYELOGRAM, URETEROSCOPY AND STENT PLACEMENT Bilateral 05/11/2019   Procedure: CYSTOSCOPY WITH RETROGRADE PYELOGRAM,  AND STENT PLACEMENT;  Surgeon: Crista Elliot, MD;  Location: [facility] ORS;  Service: Urology;  Laterality: Bilateral;   CYSTOSCOPY/URETEROSCOPY/HOLMIUM LASER/STENT PLACEMENT Bilateral 05/25/2019   Procedure: CYSTOSCOPY BILATERAL URETEROSCOPY/HOLMIUM LASER/STENT PLACEMENT;  Surgeon: Crista Elliot, MD;  Location: [facility] ORS;  Service: Urology;  Laterality: Bilateral;   INTRAMEDULLARY (IM) NAIL INTERTROCHANTERIC Left 10/18/2022   Procedure: INTRAMEDULLARY NAILING OF LEFT FEMUR;  Surgeon: Myrene Galas, MD;  Location: [facility] OR;  Service: Orthopedics;  Laterality: Left;   LEFT HEART CATHETERIZATION WITH CORONARY ANGIOGRAM N/A 08/27/2011  Procedure: LEFT HEART CATHETERIZATION WITH CORONARY ANGIOGRAM;  Surgeon: Lennette Biharihomas A Kelly, MD;  [facility];  Service: Cardiovascular;;For anterior STEMI/cardiac arrest -- 100% mid LAD occlusion   PERCUTANEOUS CORONARY STENT INTERVENTION (PCI-S) N/A 08/27/2011   Procedure: PERCUTANEOUS CORONARY STENT INTERVENTION (PCI-S);  Surgeon: Lennette Biharihomas A Kelly, MD;  [facility];  Service: Cardiovascular;;  mid LAD PCI --> Promus Element DES 3.5 mm at 24 mm (3.72 mm)   TRANSTHORACIC ECHOCARDIOGRAM  08/2011   EF 40-45%, moderate at K. of mid and distal inferior septum and anterior apical myocardium. Grade 1 diastolic  function. -- Followup echocardiogram to reassess his EF was denied by insurance company   TRANSTHORACIC ECHOCARDIOGRAM  07/20/2020    EF 40 to 45% (similar to 2012) stable wall motion normality (severe HK of midapical anterior apical wall-consistent with prior infarct).  GRII DD.  Normal valves. => Stable compared to 08/30/2011.  EF was still 40 to 45%.   Patient Active Problem List   Diagnosis Date Noted   Hip fracture 10/18/2022   Intertrochanteric fracture of left hip 10/17/2022   Breast cancer 10/03/2021   Osteoarthritis 10/03/2021   Osteoporosis 07/10/2021   Thoracic aortic atherosclerosis (HCC)-seen on CT 05/11/2021   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 05/11/2021   Age-related osteoporosis without current pathological fracture 02/02/2021   Chronic kidney disease, stage 4 (severe) 02/02/2021   Insomnia 02/02/2021   Localized, primary osteoarthritis of hand 02/02/2021   Osteopenia 02/02/2021   Restless legs 02/02/2021   Rheumatoid arthritis 02/02/2021   Sciatica 02/02/2021   Vitamin D deficiency 02/02/2021   Genetic testing 07/15/2020   Preoperative cardiovascular examination 07/11/2020   Family history of breast cancer    Family history of skin cancer    Malignant neoplasm of upper-outer quadrant of right breast in female, estrogen receptor positive 06/15/2020   Pain in left knee 05/28/2019   Ureteral calculus 05/11/2019   Obesity (BMI 30-39.9) 07/18/2013   Essential hypertension    Hyperlipidemia associated with type 2 diabetes mellitus 07/16/2013   Glucose intolerance (impaired glucose tolerance) 07/16/2013   Ventricular tachycardia, sustained; Peri-infarct.  No further episodes 08/28/2011   Hypokalemia 08/27/2011   H/O Anterior STEMI:  Proximal LAD  insertion of a 3.5x24 mm Promus element DES (08/2011) 08/27/2011   CAD S/P percutaneous coronary angioplasty 08/16/2011    REFERRING DIAG: right breast cancer at risk for  lymphedema  THERAPY DIAG:  Aftercare following surgery for neoplasm  PERTINENT HISTORY: Rt breast lumpectomy on 08/05/20 with 2 lymph nodes removed both negative.  Completed radiation.  On Anastrozole x 10/28/20 tolerating well   PRECAUTIONS: right UE Lymphedema risk, None  SUBJECTIVE: Pt returns for her 6 month L-Dex screen.   PAIN:  Are you having pain? No  SOZO SCREENING: Patient was assessed today using the SOZO machine to determine the lymphedema index score. This was compared to her baseline score. It was determined that she is within the recommended range when compared to her baseline and no further action is needed at this time. She will continue SOZO screenings. These are done every 3 months for 2 years post operatively followed by every 6 months for 2 years, and then annually.   L-DEX FLOWSHEETS - 01/21/23 0900       L-DEX LYMPHEDEMA SCREENING   Measurement Type Unilateral    L-DEX MEASUREMENT EXTREMITY Upper Extremity    POSITION  Standing    DOMINANT SIDE Right    At Risk Side Right  BASELINE SCORE (UNILATERAL) 7.4    L-DEX SCORE (UNILATERAL) 8.5    VALUE CHANGE (UNILAT) 1.1               Hermenia Bers, PTA 01/21/2023, 9:11 AM

## 2023-01-24 DIAGNOSIS — E875 Hyperkalemia: Secondary | ICD-10-CM | POA: Diagnosis not present

## 2023-03-29 DIAGNOSIS — M5416 Radiculopathy, lumbar region: Secondary | ICD-10-CM | POA: Diagnosis not present

## 2023-04-04 DIAGNOSIS — M79643 Pain in unspecified hand: Secondary | ICD-10-CM | POA: Diagnosis not present

## 2023-04-04 DIAGNOSIS — M25562 Pain in left knee: Secondary | ICD-10-CM | POA: Diagnosis not present

## 2023-04-04 DIAGNOSIS — Z79899 Other long term (current) drug therapy: Secondary | ICD-10-CM | POA: Diagnosis not present

## 2023-04-04 DIAGNOSIS — M549 Dorsalgia, unspecified: Secondary | ICD-10-CM | POA: Diagnosis not present

## 2023-04-04 DIAGNOSIS — M1990 Unspecified osteoarthritis, unspecified site: Secondary | ICD-10-CM | POA: Diagnosis not present

## 2023-04-04 DIAGNOSIS — M810 Age-related osteoporosis without current pathological fracture: Secondary | ICD-10-CM | POA: Diagnosis not present

## 2023-04-04 DIAGNOSIS — D649 Anemia, unspecified: Secondary | ICD-10-CM | POA: Diagnosis not present

## 2023-04-04 DIAGNOSIS — M0579 Rheumatoid arthritis with rheumatoid factor of multiple sites without organ or systems involvement: Secondary | ICD-10-CM | POA: Diagnosis not present

## 2023-04-04 DIAGNOSIS — N1832 Chronic kidney disease, stage 3b: Secondary | ICD-10-CM | POA: Diagnosis not present

## 2023-04-24 ENCOUNTER — Other Ambulatory Visit: Payer: Self-pay | Admitting: Cardiology

## 2023-04-29 ENCOUNTER — Other Ambulatory Visit: Payer: Self-pay | Admitting: Podiatry

## 2023-04-29 NOTE — Telephone Encounter (Signed)
Patient hasn't been seen since 2022.  Needs to come in for appt before refills can be approved.

## 2023-05-13 ENCOUNTER — Ambulatory Visit
Admission: RE | Admit: 2023-05-13 | Discharge: 2023-05-13 | Disposition: A | Discharge status: Home / Self Care | Payer: Medicare Other | Source: Ambulatory Visit | Attending: Adult Health | Admitting: Adult Health

## 2023-05-13 DIAGNOSIS — Z1231 Encounter for screening mammogram for malignant neoplasm of breast: Secondary | ICD-10-CM

## 2023-05-27 DIAGNOSIS — H04123 Dry eye syndrome of bilateral lacrimal glands: Secondary | ICD-10-CM | POA: Diagnosis not present

## 2023-05-27 DIAGNOSIS — Z961 Presence of intraocular lens: Secondary | ICD-10-CM | POA: Diagnosis not present

## 2023-05-27 DIAGNOSIS — H1045 Other chronic allergic conjunctivitis: Secondary | ICD-10-CM | POA: Diagnosis not present

## 2023-06-03 ENCOUNTER — Other Ambulatory Visit: Payer: Self-pay | Admitting: Podiatry

## 2023-06-03 ENCOUNTER — Other Ambulatory Visit: Payer: Self-pay | Admitting: Cardiology

## 2023-06-10 DIAGNOSIS — N184 Chronic kidney disease, stage 4 (severe): Secondary | ICD-10-CM | POA: Diagnosis not present

## 2023-06-10 LAB — LAB REPORT - SCANNED: EGFR: 20

## 2023-06-21 DIAGNOSIS — I129 Hypertensive chronic kidney disease with stage 1 through stage 4 chronic kidney disease, or unspecified chronic kidney disease: Secondary | ICD-10-CM | POA: Diagnosis not present

## 2023-06-21 DIAGNOSIS — N184 Chronic kidney disease, stage 4 (severe): Secondary | ICD-10-CM | POA: Diagnosis not present

## 2023-06-21 DIAGNOSIS — E876 Hypokalemia: Secondary | ICD-10-CM | POA: Diagnosis not present

## 2023-06-21 DIAGNOSIS — M069 Rheumatoid arthritis, unspecified: Secondary | ICD-10-CM | POA: Diagnosis not present

## 2023-06-21 DIAGNOSIS — D631 Anemia in chronic kidney disease: Secondary | ICD-10-CM | POA: Diagnosis not present

## 2023-06-21 DIAGNOSIS — N2581 Secondary hyperparathyroidism of renal origin: Secondary | ICD-10-CM | POA: Diagnosis not present

## 2023-07-03 ENCOUNTER — Encounter: Payer: Self-pay | Admitting: Cardiology

## 2023-07-03 ENCOUNTER — Ambulatory Visit: Payer: Medicare Other | Attending: Cardiology | Admitting: Cardiology

## 2023-07-03 VITALS — BP 124/84 | HR 85 | Ht 60.0 in | Wt 165.6 lb

## 2023-07-03 DIAGNOSIS — N184 Chronic kidney disease, stage 4 (severe): Secondary | ICD-10-CM | POA: Diagnosis not present

## 2023-07-03 DIAGNOSIS — E785 Hyperlipidemia, unspecified: Secondary | ICD-10-CM | POA: Diagnosis not present

## 2023-07-03 DIAGNOSIS — I472 Ventricular tachycardia, unspecified: Secondary | ICD-10-CM

## 2023-07-03 DIAGNOSIS — E669 Obesity, unspecified: Secondary | ICD-10-CM

## 2023-07-03 DIAGNOSIS — I2510 Atherosclerotic heart disease of native coronary artery without angina pectoris: Secondary | ICD-10-CM

## 2023-07-03 DIAGNOSIS — E1169 Type 2 diabetes mellitus with other specified complication: Secondary | ICD-10-CM

## 2023-07-03 DIAGNOSIS — I2109 ST elevation (STEMI) myocardial infarction involving other coronary artery of anterior wall: Secondary | ICD-10-CM | POA: Diagnosis not present

## 2023-07-03 DIAGNOSIS — Z9861 Coronary angioplasty status: Secondary | ICD-10-CM

## 2023-07-03 DIAGNOSIS — I10 Essential (primary) hypertension: Secondary | ICD-10-CM | POA: Diagnosis not present

## 2023-07-03 NOTE — Progress Notes (Signed)
Cardiology Office Note:  .   Date:  07/12/2023  ID:  Samantha Mcbride, DOB September 29, 1951, MRN [id number] PCP: Tally Joe, MD  [facility] Providers Cardiologist:  Bryan Lemma, MD     Chief Complaint  Patient presents with   Follow-up    Doing okay.  Still walking a little bit with a limp after her surgery   Coronary Artery Disease    No angina   Cardiomyopathy    No real short of breath symptoms.    History of Present Illness: .     Samantha Mcbride is a 72 y.o. 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  Samantha Mcbride was last seen on 05/14/2022: doing well w/ exception of fatigue & exercise intolerance; bothered by back pain & sciatica (L Leg) limiting walking.  Never really had antecedent Sx prior to MI -- & not having CV Sx of CP, worsening DOE, PND/Orthopnea, edema.   Hoping Ortho procedures will help free her up to return to exercise/walking.  => No med changes. ROV 1 yr.   INTRAMEDULLARY (IM) NAIL INTERTROCHANTERIC Left 10/18/2022  Procedure: INTRAMEDULLARY NAILING OF LEFT FEMUR;  Surgeon: Myrene Galas, MD;  Location: [facility] OR;  Service: Orthopedics;  Laterality: Left;      Subjective  INTERVAL HISTORY Samantha Mcbride returns here today for somewhat delayed annual follow-up.  She told me about the episode of how she broke her leg.  Basically she was at the orthopedic office having right arm tennis elbow drained.  After the procedure, her arm was in a sling.  She went to her daughter's house and as soon as she open the door her daughter's dog ran out and knocked her down-not being able to hold onto the door with her hand in a sling, and she fell off the porch on the ground and broke her left leg landing on the sidewalk.  She ended up having her femur internally fixated with a nail and since then she has been having some  swelling in that leg and walks with a limp.  Having to use a cane.  But she is getting around a lot better than she had been.  If she goes longer distances she will probably use a rollator and that lets her get around faster.  Probably because of that she has been less mobile than she had been but is doing better as time goes by.  She continues to deny any major cardiac symptoms of chest pain or pressure with rest or exertion.  She does have a little exertional dyspnea from being deconditioned, but not any more than usual.  No PND, orthopnea or edema.  Otherwise no active cardiac symptoms.  Cardiovascular ROS: positive for - dyspnea on exertion, edema, and => as noted, promote stable exertional dyspnea from deconditioning especially after surgery.  Mild end of day swelling but goes down which puts her legs up.  But the left leg is actually probably more swollen now than it had been. negative for - chest pain, irregular heartbeat, orthopnea, palpitations, paroxysmal nocturnal dyspnea, rapid heart rate, shortness of breath, or syncope or near syncope, TIA or emesis fugax, claudication  ROS:  Review of Systems - Negative except chronic deconditioning having gained weight because of lack of activity.  Had been sedentary before because  of MSK pains but now more so after her leg pain.  Previously she had lost weight because appetite was down but being more sedentary she is actually gained weight back. Musculoskeletal ROS: positive for - gait disturbance, joint swelling, and pain in hip - left and leg - left she now has more sciatica pain.  Occasional vertigo.  Also her knees and hips bother her.    Objective   Studies Reviewed: Marland Kitchen        No new studies.  Past CV Procedure History:  Procedure Date   ABDOMINAL AORTAGRAM 08/27/2011   Procedure: ABDOMINAL AORTAGRAM;  Surgeon: Lennette Bihari, MD;  Location: [facility] CATH LAB;  Service: Cardiovascular;  Laterality: N/A;      LEFT HEART CATHETERIZATION WITH  CORONARY ANGIOGRAM 08/27/2011   Procedure: LEFT HEART CATHETERIZATION WITH CORONARY ANGIOGRAM;  Surgeon: Lennette Bihari, MD;  Location: [facility] CATH LAB;  Service: Cardiovascular;;For anterior STEMI/cardiac arrest -- 100% mid LAD occlusion   PERCUTANEOUS CORONARY STENT INTERVENTION (PCI-S) 08/27/2011   Procedure: PERCUTANEOUS CORONARY STENT INTERVENTION (PCI-S);  Surgeon: Lennette Bihari, MD;  Location: [facility] CATH LAB;  Service: Cardiovascular;;  mid LAD PCI --> Promus Element DES 3.5 mm at 24 mm (3.72 mm)       TRANSTHORACIC ECHOCARDIOGRAM 07/20/2020    EF 40 to 45% (similar to 2012) stable wall motion normality (severe HK of midapical anterior apical wall-consistent with prior infarct).  GRII DD.  Normal valves. => Stable compared to 08/30/2011.  EF was still 40 to 45%.     06/10/2023:  Na+ 142, K+ 4.6, Cl- 107, HCO3- 25 , BUN 34, Cr 2.47, Glu 81, Ca2+ 9.8;  CBC: W 8.4, H/H 10.5/32.2, Plt 197   Risk Assessment/Calculations:          Physical Exam:   VS:  BP 124/84   Pulse 85   Ht 5' (1.524 m)   Wt 165 lb 9.6 oz (75.1 kg)   SpO2 97%   BMI 32.34 kg/m    Wt Readings from Last 3 Encounters:  07/03/23 165 lb 9.6 oz (75.1 kg)  01/21/23 141 lb 8 oz (64.2 kg)  10/18/22 160 lb (72.6 kg)    GEN: Well nourished, well developed in no acute distress; obese.  NECK: No JVD; No carotid bruits CARDIAC: Distant/ Normal S1, S2; RRR, no murmurs, rubs, gallops RESPIRATORY:  Clear to auscultation without rales, wheezing or rhonchi ; nonlabored, good air movement. ABDOMEN: Soft, non-tender, non-distended EXTREMITIES:  no notable  ankle edema; No deformity     ASSESSMENT AND PLAN: .    Problem List Items Addressed This Visit       Cardiology Problems   CAD S/P percutaneous coronary angioplasty (Chronic)    History of LAD PCI in 2012 in the setting of large anterior MI.  Thankfully, no recurrent issues of anginal chest pains or significant CHF. She has had a couple episodes in the last year or so  where she has had to hold Plavix for surgeries or procedures and has done well.  Plan: On stable dose of carvedilol 12.5 mg twice daily.   Not on ACE inhibitor/ARB due to renal insufficiency stopped by nephrology. On combination of rosuvastatin 20 mg plus Zetia 10 mg with labs to be checked. Based on relatively long proximal LAD stent, she is on maintenance dose Plavix. Okay to hold Plavix 5 to 7 days preop for surgeries or procedures.  5 days for minor procedures in 7 days for high risk procedure/spinal and neurologic Restart  when safe postop.      Relevant Orders   Hepatic function panel (Completed)   Lipid panel (Completed)   Essential hypertension (Chronic)    BP still looks pretty good on carvedilol 12.5 mg twice daily alone.  No change.      H/O Anterior STEMI:  Proximal LAD  insertion of a 3.5x24 mm Promus element DES (08/2011) - Primary (Chronic)    Almost 12 years out from her anterior MI complicated by VT/VF.  Really has done well ever since for cardiac standpoint.  No active heart failure or angina symptoms.,  Despite EF 40 to 45% on follow-up echocardiogram.      Hyperlipidemia with target LDL less than 70 (Chronic)    Last lipid panel was from July 2023.  Looks pretty much at goal with an LDL of <50 (43).  Due for lab recheck, but will continue current dose of Zetia 10 mg and rosuvastatin 20 mg.   Addendum LDL up some-likely reflects her decreased mobility over the last year. Continue current dose of meds but low threshold to convert Zetia to Nexlizet. Lab Results  Component Value Date   CHOL 152 07/08/2023   HDL 64 07/08/2023   LDLCALC 65 07/08/2023   TRIG 132 07/08/2023   CHOLHDL 2.4 07/08/2023        Relevant Orders   Hepatic function panel (Completed)   Lipid panel (Completed)   Ventricular tachycardia, sustained; Peri-infarct.  No further episodes (Chronic)    Peri-infarct ischemic VT requiring CPR in the setting of her anterior MI back in 2012.   Following vascularization, has not had any further issues.        Other   Chronic kidney disease, stage 4 (severe) (HCC) (Chronic)    Most recent creatinine was 2.47.  Being followed by nephrology.  Not on ACE inhibitor or ARB. Likely too high for SGLT2 inhibitor.      Obesity (BMI 30-39.9) (Chronic)    Hopefully now that she is getting more mobile she will better adjust her diet and get back to doing more walking and exercise.      Relevant Orders   Hepatic function panel (Completed)   Lipid panel (Completed)            Dispo: Return in about 1 year (around 07/02/2024) for 1 Yr Follow-up.  Total time spent: 19 min spent with patient + 23 min spent charting = 42 min     Signed, Marykay Lex, MD, MS Bryan Lemma, M.D., M.S. Interventional Cardiologist  [facility]  Pager # [phone] Phone # [phone] [address] [place], [place] [address]

## 2023-07-03 NOTE — Patient Instructions (Signed)
Medication Instructions:   No changes *If you need a refill on your cardiac medications before your next appointment, please call your pharmacy*   Lab Work: Lipid Liver panel If you have labs (blood work) drawn today and your tests are completely normal, you will receive your results only by: MyChart Message (if you have MyChart) OR A paper copy in the mail If you have any lab test that is abnormal or we need to change your treatment, we will call you to review the results.   Testing/Procedures:  Not needed  Follow-Up: At [facility], you and your health needs are our priority.  As part of our continuing mission to provide you with exceptional heart care, we have created designated Provider Care Teams.  These Care Teams include your primary Cardiologist (physician) and Advanced Practice Providers (APPs -  Physician Assistants and Nurse Practitioners) who all work together to provide you with the care you need, when you need it.     Your next appointment:   12 month(s)  The format for your next appointment:   In Person  Provider:   Bryan Lemma, MD

## 2023-07-05 DIAGNOSIS — M5416 Radiculopathy, lumbar region: Secondary | ICD-10-CM | POA: Diagnosis not present

## 2023-07-08 DIAGNOSIS — N184 Chronic kidney disease, stage 4 (severe): Secondary | ICD-10-CM | POA: Diagnosis not present

## 2023-07-08 DIAGNOSIS — E785 Hyperlipidemia, unspecified: Secondary | ICD-10-CM | POA: Diagnosis not present

## 2023-07-08 DIAGNOSIS — I2510 Atherosclerotic heart disease of native coronary artery without angina pectoris: Secondary | ICD-10-CM | POA: Diagnosis not present

## 2023-07-08 DIAGNOSIS — E1169 Type 2 diabetes mellitus with other specified complication: Secondary | ICD-10-CM | POA: Diagnosis not present

## 2023-07-08 DIAGNOSIS — Z9861 Coronary angioplasty status: Secondary | ICD-10-CM | POA: Diagnosis not present

## 2023-07-09 LAB — LIPID PANEL
Chol/HDL Ratio: 2.4 ratio (ref 0.0–4.4)
Cholesterol, Total: 152 mg/dL (ref 100–199)
HDL: 64 mg/dL (ref >39)
LDL Chol Calc (NIH): 65 mg/dL (ref 0–99)
Triglycerides: 132 mg/dL (ref 0–149)
VLDL Cholesterol Cal: 23 mg/dL (ref 5–40)

## 2023-07-09 LAB — HEPATIC FUNCTION PANEL
ALT: 12 IU/L (ref 0–32)
AST: 15 IU/L (ref 0–40)
Albumin: 4.2 g/dL (ref 3.8–4.8)
Alkaline Phosphatase: 57 IU/L (ref 44–121)
Bilirubin Total: <0.2 mg/dL (ref 0.0–1.2)
Bilirubin, Direct: <0.1 mg/dL (ref 0.00–0.40)
Total Protein: 6.3 g/dL (ref 6.0–8.5)

## 2023-07-12 ENCOUNTER — Encounter: Payer: Self-pay | Admitting: Cardiology

## 2023-07-12 NOTE — Assessment & Plan Note (Signed)
BP still looks pretty good on carvedilol 12.5 mg twice daily alone.  No change.

## 2023-07-12 NOTE — Assessment & Plan Note (Signed)
History of LAD PCI in 2012 in the setting of large anterior MI.  Thankfully, no recurrent issues of anginal chest pains or significant CHF. She has had a couple episodes in the last year or so where she has had to hold Plavix for surgeries or procedures and has done well.  Plan: On stable dose of carvedilol 12.5 mg twice daily.   Not on ACE inhibitor/ARB due to renal insufficiency stopped by nephrology. On combination of rosuvastatin 20 mg plus Zetia 10 mg with labs to be checked. Based on relatively long proximal LAD stent, she is on maintenance dose Plavix. Okay to hold Plavix 5 to 7 days preop for surgeries or procedures.  5 days for minor procedures in 7 days for high risk procedure/spinal and neurologic Restart when safe postop.

## 2023-07-12 NOTE — Assessment & Plan Note (Signed)
Hopefully now that she is getting more mobile she will better adjust her diet and get back to doing more walking and exercise.

## 2023-07-12 NOTE — Assessment & Plan Note (Signed)
Peri-infarct ischemic VT requiring CPR in the setting of her anterior MI back in 2012.  Following vascularization, has not had any further issues.

## 2023-07-12 NOTE — Assessment & Plan Note (Addendum)
Last lipid panel was from July 2023.  Looks pretty much at goal with an LDL of <50 (43).  Due for lab recheck, but will continue current dose of Zetia 10 mg and rosuvastatin 20 mg.   Addendum LDL up some-likely reflects her decreased mobility over the last year. Continue current dose of meds but low threshold to convert Zetia to Nexlizet. Lab Results  Component Value Date   CHOL 152 07/08/2023   HDL 64 07/08/2023   LDLCALC 65 07/08/2023   TRIG 132 07/08/2023   CHOLHDL 2.4 07/08/2023

## 2023-07-12 NOTE — Assessment & Plan Note (Signed)
Most recent creatinine was 2.47.  Being followed by nephrology.  Not on ACE inhibitor or ARB. Likely too high for SGLT2 inhibitor.

## 2023-07-12 NOTE — Assessment & Plan Note (Signed)
Almost 12 years out from her anterior MI complicated by VT/VF.  Really has done well ever since for cardiac standpoint.  No active heart failure or angina symptoms.,  Despite EF 40 to 45% on follow-up echocardiogram.

## 2023-07-23 DIAGNOSIS — M25552 Pain in left hip: Secondary | ICD-10-CM | POA: Diagnosis not present

## 2023-07-23 DIAGNOSIS — Z79899 Other long term (current) drug therapy: Secondary | ICD-10-CM | POA: Diagnosis not present

## 2023-07-23 DIAGNOSIS — M79643 Pain in unspecified hand: Secondary | ICD-10-CM | POA: Diagnosis not present

## 2023-07-23 DIAGNOSIS — M1990 Unspecified osteoarthritis, unspecified site: Secondary | ICD-10-CM | POA: Diagnosis not present

## 2023-07-23 DIAGNOSIS — M549 Dorsalgia, unspecified: Secondary | ICD-10-CM | POA: Diagnosis not present

## 2023-07-23 DIAGNOSIS — M810 Age-related osteoporosis without current pathological fracture: Secondary | ICD-10-CM | POA: Diagnosis not present

## 2023-07-23 DIAGNOSIS — M0579 Rheumatoid arthritis with rheumatoid factor of multiple sites without organ or systems involvement: Secondary | ICD-10-CM | POA: Diagnosis not present

## 2023-07-23 DIAGNOSIS — N1832 Chronic kidney disease, stage 3b: Secondary | ICD-10-CM | POA: Diagnosis not present

## 2023-07-23 DIAGNOSIS — D649 Anemia, unspecified: Secondary | ICD-10-CM | POA: Diagnosis not present

## 2023-07-23 DIAGNOSIS — M7062 Trochanteric bursitis, left hip: Secondary | ICD-10-CM | POA: Diagnosis not present

## 2023-07-25 ENCOUNTER — Inpatient Hospital Stay: Payer: Medicare Other | Attending: Hematology and Oncology | Admitting: Hematology and Oncology

## 2023-07-25 VITALS — BP 147/78 | HR 80 | Temp 97.5°F | Resp 18 | Ht 60.0 in | Wt 170.2 lb

## 2023-07-25 DIAGNOSIS — C50411 Malignant neoplasm of upper-outer quadrant of right female breast: Secondary | ICD-10-CM

## 2023-07-25 DIAGNOSIS — Z170 Estrogen receptor positive status [ER+]: Secondary | ICD-10-CM | POA: Insufficient documentation

## 2023-07-25 DIAGNOSIS — M810 Age-related osteoporosis without current pathological fracture: Secondary | ICD-10-CM | POA: Diagnosis not present

## 2023-07-25 DIAGNOSIS — Z79899 Other long term (current) drug therapy: Secondary | ICD-10-CM | POA: Insufficient documentation

## 2023-07-25 NOTE — Assessment & Plan Note (Signed)
05/20/20 showed a 0.6cm asymmetry in the lateral right breast and a 0.5cm mass at the 12:30 position. 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  Adjuvant radiation therapy started 09/13/2020 2. followed by adjuvant antiestrogen therapy with anastrozole daily discontinued after 2 years due to osteoporosis and severe damage to teeth    Osteoporosis:  calcium and Vit D, discontinued bisphosphonates because of dental issues Bone density 05/02/2022: T score -2.6:     Breast cancer surveillance: Mammogram 05/14/2023: No mammographic evidence of malignancy.  Breast density category B   Return to clinic in 1 year for follow-up and surveillance.

## 2023-07-25 NOTE — Progress Notes (Signed)
Patient Care Team: Tally Joe, MD as PCP - General (Family Medicine) Marykay Lex, MD as PCP - Cardiology (Cardiology) Dorothy Puffer, MD as Consulting Physician (Radiation Oncology) Griselda Miner, MD as Consulting Physician (General Surgery) Serena Croissant, MD as Consulting Physician (Hematology and Oncology)  DIAGNOSIS:  Encounter Diagnosis  Name Primary?   Malignant neoplasm of upper-outer quadrant of right breast in female, estrogen receptor positive (HCC) Yes    SUMMARY OF ONCOLOGIC HISTORY: Oncology History  Malignant neoplasm of upper-outer quadrant of right breast in female, estrogen receptor positive (HCC)  06/02/2020 Initial Diagnosis   05/20/20 showed a 0.6cm asymmetry in the lateral right breast and a 0.5cm mass at the 12:30 position. Biopsy on 06/02/20 showed invasive ductal carcinoma, grade 1, HER-2 equivocal by IHC, negative by FISH, ER+ 95%, PR+ 95%, Ki67 10%   07/15/2020 Genetic Testing   Negative genetic testing on the common hereditary cancer panel.  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  The Common Hereditary Gene Panel offered by [facility] includes sequencing and/or deletion duplication testing of the following 48 genes: APC, ATM, AXIN2, BARD1, BMPR1A, BRCA1, BRCA2, BRIP1, CDH1, CDK4, CDKN2A (p14ARF), CDKN2A (p16INK4a), CHEK2, CTNNA1, DICER1, EPCAM (Deletion/duplication testing only), GREM1 (promoter region deletion/duplication testing only), KIT, MEN1, MLH1, MSH2, MSH3, MSH6, MUTYH, NBN, NF1, NHTL1, PALB2, PDGFRA, PMS2, POLD1, POLE, PTEN, RAD50, RAD51C, RAD51D, RNF43, SDHB, SDHC, SDHD, SMAD4, SMARCA4. STK11, TP53, TSC1, TSC2, and VHL.  The following genes were evaluated for sequence changes only: SDHA and HOXB13 c.251G>A variant only. The report date is 07/15/2020.   08/05/2020 Surgery   Right lumpectomy Carolynne Edouard) [phone]): IDC, grade 1, 0.4cm, clear margins, 2 right axillary lymph nodes negative for carcinoma   09/12/2020 - 10/12/2020 Radiation Therapy   The patient  initially received a dose of 42.56 Gy in 16 fractions to the breast using whole-breast tangent fields. This was delivered using a 3-D conformal technique. The pt received a boost delivering an additional 8 Gy in 4 fractions using a electron boost with electrons. The total dose was 50.56 Gy.   09/2020 - 09/2025 Anti-estrogen oral therapy   Anastrozole     CHIEF COMPLIANT: Surveillance of breast cancer  History of Present Illness   The patient, a three-year breast cancer survivor, presents for a follow-up visit. She reports a series of unfortunate events over the past year, beginning with a femur fracture in January due to a fall caused by her daughter's dog. The fall resulted in a metal bar being placed in her left hip. She reports ongoing pain in the hip, for which she received an injection from her rheumatologist two days prior to the current visit.  In a separate incident, the patient fell while using her rollator, resulting in a scrape on her elbow and a twisted leg. She describes the leg pain as feeling like a horse kick. To manage the pain, she has been using four pain patches on each leg.  The patient also mentions that she has been off her breast cancer medication for some time. She has been keeping up with her mammograms, the most recent of which was on July 30th and showed no abnormalities. The patient declined a breast exam during the current visit.       ALLERGIES:  has No Known Allergies.  MEDICATIONS:  Current Outpatient Medications  Medication Sig Dispense Refill   acetaminophen (TYLENOL) 500 MG tablet Take 500 mg by mouth every 6 (six) hours as needed for moderate pain or headache.  Calcium Citrate-Vitamin D (CALCIUM + D PO) Take 1 tablet by mouth daily.     carvedilol (COREG) 12.5 MG tablet Take 12.5 mg by mouth 2 (two) times daily.     clopidogrel (PLAVIX) 75 MG tablet TAKE 1 TABLET BY MOUTH EVERY DAY 90 tablet 3   diclofenac sodium (VOLTAREN) 1 % GEL Apply 2 g  topically 4 (four) times daily as needed (pain).      DM-APAP-CPM (CORICIDIN HBP PO) Take 1 tablet by mouth daily as needed (allergies).     ezetimibe (ZETIA) 10 MG tablet TAKE 1 TABLET BY MOUTH EVERY DAY 90 tablet 3   gabapentin (NEURONTIN) 100 MG capsule TAKE 3 CAPSULES BY MOUTH AT BEDTIME 270 capsule 3   HYDROcodone-acetaminophen (NORCO/VICODIN) 5-325 MG tablet Take 1-2 tablets by mouth every 4 (four) hours as needed for moderate pain. 30 tablet 0   hydroxychloroquine (PLAQUENIL) 200 MG tablet Take 1 tablet (200 mg total) by mouth daily.     leflunomide (ARAVA) 20 MG tablet Take 20 mg by mouth daily.     Melatonin 5 MG CAPS Take 5 mg by mouth at bedtime.     nitroGLYCERIN (NITROSTAT) 0.4 MG SL tablet PLACE 1 TABLET UNDER THE TONGUE EVERY 5 MINUTES AS NEEDED FOR CHEST PAIN. 25 tablet 4   rosuvastatin (CRESTOR) 20 MG tablet TAKE 1 TABLET BY MOUTH EVERY DAY 90 tablet 1   traZODone (DESYREL) 100 MG tablet Take 100 mg by mouth at bedtime.      No current facility-administered medications for this visit.    PHYSICAL EXAMINATION: ECOG PERFORMANCE STATUS: 1 - Symptomatic but completely ambulatory  Vitals:   07/25/23 1050  BP: (!) 147/78  Pulse: 80  Resp: 18  Temp: (!) 97.5 F (36.4 C)  SpO2: 96%   Filed Weights   07/25/23 1050  Weight: 170 lb 3.2 oz (77.2 kg)     LABORATORY DATA:  I have reviewed the data as listed    Latest Ref Rng & Units 07/08/2023    7:32 AM 10/25/2022    3:52 AM 10/24/2022    3:16 AM  CMP  Glucose 70 - 99 mg/dL  161  096   BUN 8 - 23 mg/dL  26  27   Creatinine 0.45 - 1.00 mg/dL  4.09  8.11   Sodium 914 - 145 mmol/L  138  137   Potassium 3.5 - 5.1 mmol/L  4.0  4.0   Chloride 98 - 111 mmol/L  107  105   CO2 22 - 32 mmol/L  25  25   Calcium 8.9 - 10.3 mg/dL  8.4  8.6   Total Protein 6.0 - 8.5 g/dL 6.3   5.5   Total Bilirubin 0.0 - 1.2 mg/dL <7.8   0.4   Alkaline Phos 44 - 121 IU/L 57   57   AST 0 - 40 IU/L 15   14   ALT 0 - 32 IU/L 12   6     Lab  Results  Component Value Date   WBC 9.4 10/25/2022   HGB 7.4 (L) 10/25/2022   HCT 23.5 (L) 10/25/2022   MCV 92.9 10/25/2022   PLT 209 10/25/2022   NEUTROABS 5.7 01/22/2022    ASSESSMENT & PLAN:  Malignant neoplasm of upper-outer quadrant of right breast in female, estrogen receptor positive (HCC) 05/20/20 showed a 0.6cm asymmetry in the lateral right breast and a 0.5cm mass at the 12:30 position. Biopsy on 06/02/20 showed invasive ductal carcinoma, grade 1, HER-2 equivocal  by IHC, negative by FISH, ER+ 95%, PR+ 95%, Ki67 10%   08/05/2020:Right lumpectomy Carolynne Edouard): IDC, grade 1, 0.4cm, clear margins, 2 right axillary lymph nodes negative for carcinoma   Treatment plan: 1.  Adjuvant radiation therapy started 09/13/2020 2. followed by adjuvant antiestrogen therapy with anastrozole daily discontinued after 2 years due to osteoporosis and severe damage to teeth    Osteoporosis:  calcium and Vit D, discontinued bisphosphonates because of dental issues Bone density 05/02/2022: T score -2.6:     Breast cancer surveillance: Mammogram 05/14/2023: No mammographic evidence of malignancy.  Breast density category B      Breast Cancer Three years post-diagnosis, no current complaints or abnormalities noted. Mammogram on July 30th was normal, with less dense breasts (category B) which improves detection. -Continue annual mammograms. -Next year, review mammograms remotely and contact patient if any issues arise.  Femur Fracture Sustained from a fall in January, resulting in a metal bar implant. Currently experiencing pain. -Continue management with rheumatologist.  Fall Risk Recent fall resulting in a scrape and potential leg twist. No swelling noted. Patient is using pain patches for management. -Consider evaluation for fall risk and potential interventions to reduce risk. -Consider anxiety medication if needed, with caution due to potential for increased fall risk.  General Health  Maintenance -Continue care with primary care physician for annual wellness visits.     Return to see [place] on an as-needed basis.     No orders of the defined types were placed in this encounter.  The patient has a good understanding of the overall plan. she agrees with it. she will call with any problems that may develop before the next visit here. Total time spent: 30 mins including face to face time and time spent for planning, charting and co-ordination of care   Tamsen Meek, MD 07/25/23

## 2023-07-29 ENCOUNTER — Ambulatory Visit: Payer: Medicare Other

## 2023-09-16 DIAGNOSIS — N184 Chronic kidney disease, stage 4 (severe): Secondary | ICD-10-CM | POA: Diagnosis not present

## 2023-09-25 DIAGNOSIS — N184 Chronic kidney disease, stage 4 (severe): Secondary | ICD-10-CM | POA: Diagnosis not present

## 2023-09-25 DIAGNOSIS — M069 Rheumatoid arthritis, unspecified: Secondary | ICD-10-CM | POA: Diagnosis not present

## 2023-09-25 DIAGNOSIS — I129 Hypertensive chronic kidney disease with stage 1 through stage 4 chronic kidney disease, or unspecified chronic kidney disease: Secondary | ICD-10-CM | POA: Diagnosis not present

## 2023-09-25 DIAGNOSIS — N2581 Secondary hyperparathyroidism of renal origin: Secondary | ICD-10-CM | POA: Diagnosis not present

## 2023-09-25 DIAGNOSIS — D631 Anemia in chronic kidney disease: Secondary | ICD-10-CM | POA: Diagnosis not present

## 2023-10-02 ENCOUNTER — Telehealth: Payer: Self-pay | Admitting: *Deleted

## 2023-10-02 ENCOUNTER — Telehealth: Payer: Self-pay

## 2023-10-02 NOTE — Telephone Encounter (Signed)
  Patient Consent for Virtual Visit         Samantha Mcbride has provided verbal consent on 10/02/2023 for a virtual visit (video or telephone).   CONSENT FOR VIRTUAL VISIT FOR:  Samantha Mcbride  By participating in this virtual visit I agree to the following:  I hereby voluntarily request, consent and authorize [facility] and its employed or contracted physicians, physician assistants, nurse practitioners or other licensed health care professionals (the Practitioner), to provide me with telemedicine health care services (the "Services") as deemed necessary by the treating Practitioner. I acknowledge and consent to receive the Services by the Practitioner via telemedicine. I understand that the telemedicine visit will involve communicating with the Practitioner through live audiovisual communication technology and the disclosure of certain medical information by electronic transmission. I acknowledge that I have been given the opportunity to request an in-person assessment or other available alternative prior to the telemedicine visit and am voluntarily participating in the telemedicine visit.  I understand that I have the right to withhold or withdraw my consent to the use of telemedicine in the course of my care at any time, without affecting my right to future care or treatment, and that the Practitioner or I may terminate the telemedicine visit at any time. I understand that I have the right to inspect all information obtained and/or recorded in the course of the telemedicine visit and may receive copies of available information for a reasonable fee.  I understand that some of the potential risks of receiving the Services via telemedicine include:  Delay or interruption in medical evaluation due to technological equipment failure or disruption; Information transmitted may not be sufficient (e.g. poor resolution of images) to allow for appropriate medical decision making by the  Practitioner; and/or  In rare instances, security protocols could fail, causing a breach of personal health information.  Furthermore, I acknowledge that it is my responsibility to provide information about my medical history, conditions and care that is complete and accurate to the best of my ability. I acknowledge that Practitioner's advice, recommendations, and/or decision may be based on factors not within their control, such as incomplete or inaccurate data provided by me or distortions of diagnostic images or specimens that may result from electronic transmissions. I understand that the practice of medicine is not an exact science and that Practitioner makes no warranties or guarantees regarding treatment outcomes. I acknowledge that a copy of this consent can be made available to me via my patient portal [facility] MyChart), or I can request a printed copy by calling the office of [facility].    I understand that my insurance will be billed for this visit.   I have read or had this consent read to me. I understand the contents of this consent, which adequately explains the benefits and risks of the Services being provided via telemedicine.  I have been provided ample opportunity to ask questions regarding this consent and the Services and have had my questions answered to my satisfaction. I give my informed consent for the services to be provided through the use of telemedicine in my medical care

## 2023-10-02 NOTE — Telephone Encounter (Signed)
      Name: Samantha Mcbride  DOB: 01/19/1951  MRN: [id number]  Primary Cardiologist: Bryan Lemma, MD   Preoperative team, please contact this patient and set up a phone call appointment for further preoperative risk assessment. Please obtain consent and complete medication review. Thank you for your help.  Chart reviewed as part of pre-operative protocol coverage. Simple dental extractions are considered low risk procedures per guidelines and generally do not require any specific cardiac clearance. It is also generally accepted that for simple extractions and dental cleanings, there is no need to interrupt blood thinner therapy.   SBE prophylaxis is not required for the patient.  Upcoming dental procedures may be addressed in same phone appointment.  Thank you for your help.   Ronney Asters, NP 10/02/2023, 10:18 AM

## 2023-10-02 NOTE — Telephone Encounter (Signed)
   Pre-operative Risk Assessment    Patient Name: Samantha Mcbride  DOB: 09-09-1951 MRN: [id number]  DATE OF LAST VISIT: 07/03/23 DR. HARDING DATE OF NEXT VISIT: NONE   Request for Surgical Clearance    Procedure:  Dental Extraction - Amount of Teeth to be Pulled:  16 SURGICAL EXTRACTIONS AND 8 ALVEOLOPLASTY  Date of Surgery:  Clearance TBD                                 Surgeon:  DR. Gerlean Ren, DMD Surgeon's Group or Practice Name:  [facility] Phone number:  [phone] Fax number:  [phone]   Type of Clearance Requested:   - Medical  - Pharmacy:  Hold Clopidogrel (Plavix)     Type of Anesthesia:  Local    Additional requests/questions:    [name]   10/02/2023, 8:51 AM

## 2023-10-02 NOTE — Telephone Encounter (Signed)
Patient scheduled for tele on 10/03/23

## 2023-10-02 NOTE — Telephone Encounter (Addendum)
   Pre-operative Risk Assessment    Patient Name: Samantha Mcbride  DOB: 05/29/1951 MRN: [id number]   PT SCHEDULED FOR TELE VISIT ON 10/03/23. OK TO USE SLOT PER PREOP PROVIDER    Request for Surgical Clearance    Procedure:  Extraction of 1 tooth   Date of Surgery:  Clearance 10/04/23                                 Surgeon:  DR. Gerlean Ren, DMD  Surgeon's Group or Practice Name:  [facility]  Phone number:  [phone]  Fax number:  [phone]    Type of Clearance Requested:   - Medical    Type of Anesthesia:  Local    Additional requests/questions:    SignedVernard Gambles   10/02/2023, 10:03 AM

## 2023-10-02 NOTE — Telephone Encounter (Signed)
   Name: Samantha Mcbride  DOB: 08/24/1951  MRN: [id number]  Primary Cardiologist: Bryan Lemma, MD   Preoperative team, please contact this patient and set up a phone call appointment for further preoperative risk assessment. Please obtain consent and complete medication review. Thank you for your help.  I confirm that guidance regarding antiplatelet and oral anticoagulation therapy has been completed and, if necessary, noted below.  Her Plavix may be held for 5 days prior to her procedure.  Please resume as soon as hemostasis is achieved.  I also confirmed the patient resides in the state of [place]. As per [facility] Medical Board telemedicine laws, the patient must reside in the state in which the provider is licensed.   Ronney Asters, NP 10/02/2023, 9:02 AM [facility]

## 2023-10-02 NOTE — Telephone Encounter (Signed)
Patient is scheduled for tele visit on 10/03/23 to address upcoming extraction of 1 tooth on 10/04/23. Both clearances will need to be addressed.

## 2023-10-03 ENCOUNTER — Telehealth: Payer: Self-pay | Admitting: Nurse Practitioner

## 2023-10-03 ENCOUNTER — Ambulatory Visit: Payer: Medicare Other | Attending: Nurse Practitioner

## 2023-10-03 DIAGNOSIS — Z01810 Encounter for preprocedural cardiovascular examination: Secondary | ICD-10-CM

## 2023-10-03 NOTE — Telephone Encounter (Signed)
   Patient Name: Samantha Mcbride  DOB: [birth year] MRN: [id number]  Primary Cardiologist: Bryan Lemma, MD  Chart reviewed as part of pre-operative protocol coverage.   Simple dental extractions (i.e. 1-2 teeth) are considered low risk procedures per guidelines and generally do not require any specific cardiac clearance. It is also generally accepted that for simple extractions and dental cleanings, there is no need to interrupt blood thinner therapy.   SBE prophylaxis is not required for the patient from a cardiac standpoint.  I will route this recommendation to the requesting party via Epic fax function and remove from pre-op pool.  Please call with questions.  Napoleon Form, Leodis Rains, NP 10/03/2023, 3:05 PM

## 2023-10-03 NOTE — Progress Notes (Signed)
Virtual Visit via Telephone Note   Because of Samantha Mcbride's co-morbid illnesses, she is at least at moderate risk for complications without adequate follow up.  This format is felt to be most appropriate for this patient at this time.  The patient did not have access to video technology/had technical difficulties with video requiring transitioning to audio format only (telephone).  All issues noted in this document were discussed and addressed.  No physical exam could be performed with this format.  Please refer to the patient's chart for her consent to telehealth for [facility]  Evaluation Performed:  Preoperative cardiovascular risk assessment _____________   Date:  10/03/2023   [name], DOB June 20, 1951, MRN [id number] Patient Location:  Home Provider location:   Office  Primary Care Provider:  Tally Joe, MD Primary Cardiologist:  Bryan Lemma, MD  Chief Complaint / Patient Profile   72 y.o. y/o female with a h/o CAD s/p anterior MI 2012 with PCI of LAD, HLD, HTN, history of breast CAD, CKD stage IV, obesity who is pending dental extraction of 1 teeth and presents today for telephonic preoperative cardiovascular risk assessment.  History of Present Illness    Samantha Mcbride is a 72 y.o. female who presents via audio/video conferencing for a telehealth visit today.  Pt was last seen in cardiology clinic on 07/03/2023 by Dr. Herbie Baltimore.  At that time Wittney Towle was doing well with no new cardiac complaints. The patient is now pending procedure as outlined above. Since her last visit, she   Patient was contacted for clearance visit however after discussing upcoming procedure she states that her extraction is for 1 tooth and not 16 teeth.  She discussed with her dentist in the future that she would like to have dentures completed but for now she would like to only have 1 tooth extracted.  I will no charge for today's visit due to the mixup and  misunderstanding and will send an updated clearance to patient's dentist.    Past Medical History    Past Medical History:  Diagnosis Date   Anemia    iron deficiency anemia    CAD S/P percutaneous coronary angioplasty 08/2011   Promus DES - mid LAD 3.5 mm x 24 mm (3.72 mm)    Cancer (HCC)    Chronic kidney disease    stage 4 kidney disease per pt dx 02/2019   Complication of anesthesia    Dyslipidemia, goal LDL below 70     on statin, close to goal   Family history of breast cancer    Family history of skin cancer    Former moderate cigarette smoker (10-19 per day)    Glucose intolerance (impaired glucose tolerance)    H/O thyroid nodule    Benign   History 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 --> Promus DES =>  EF 40 to 45% (similar to 2012) stable wall motion normality (severe HK of midapical anterior apical wall-consistent with prior infarct).  GRII DD.  Normal valves. => Stable compared to 08/30/2011.  EF was still 40 to 45%.   Hypertension    Personal history of radiation therapy    PONV (postoperative nausea and vomiting)    no issues with surgery on 05-11-2019   Rheumatoid arthritis (HCC)    managed on humira    SOB (shortness of breath) on exertion    reports "its  been that way since my heart attack " repots no recurrence of MI sx since that time    Past Surgical History:  Procedure Laterality Date   ABDOMINAL AORTAGRAM N/A 08/27/2011   Procedure: ABDOMINAL Ronny Flurry;  Surgeon: Lennette Bihari, MD;  Location: [facility] CATH LAB;  Service: Cardiovascular;  Laterality: N/A;   ANKLE SURGERY Right 1995   per pt ankle surgery here at [facility]    APPENDECTOMY     BREAST EXCISIONAL BIOPSY Left    BREAST LUMPECTOMY Right 07/2020   BREAST LUMPECTOMY WITH RADIOACTIVE SEED AND SENTINEL LYMPH NODE BIOPSY Right 08/05/2020   Procedure: RIGHT BREAST LUMPECTOMY WITH RADIOACTIVE SEED AND SENTINEL LYMPH NODE  BIOPSY;  Surgeon: Griselda Miner, MD;  Location: [facility] OR;  Service: General;  Laterality: Right;   CYSTOSCOPY WITH RETROGRADE PYELOGRAM, URETEROSCOPY AND STENT PLACEMENT Bilateral 05/11/2019   Procedure: CYSTOSCOPY WITH RETROGRADE PYELOGRAM,  AND STENT PLACEMENT;  Surgeon: Crista Elliot, MD;  Location: [facility] ORS;  Service: Urology;  Laterality: Bilateral;   CYSTOSCOPY/URETEROSCOPY/HOLMIUM LASER/STENT PLACEMENT Bilateral 05/25/2019   Procedure: CYSTOSCOPY BILATERAL URETEROSCOPY/HOLMIUM LASER/STENT PLACEMENT;  Surgeon: Crista Elliot, MD;  Location: [facility] ORS;  Service: Urology;  Laterality: Bilateral;   INTRAMEDULLARY (IM) NAIL INTERTROCHANTERIC Left 10/18/2022   Procedure: INTRAMEDULLARY NAILING OF LEFT FEMUR;  Surgeon: Myrene Galas, MD;  Location: [facility] OR;  Service: Orthopedics;  Laterality: Left;   LEFT HEART CATHETERIZATION WITH CORONARY ANGIOGRAM N/A 08/27/2011   Procedure: LEFT HEART CATHETERIZATION WITH CORONARY ANGIOGRAM;  Surgeon: Lennette Bihari, MD;  Location[facility] CATH LAB;  Service: Cardiovascular;;For anterior STEMI/cardiac arrest -- 100% mid LAD occlusion   PERCUTANEOUS CORONARY STENT INTERVENTION (PCI-S) N/A 08/27/2011   Procedure: PERCUTANEOUS CORONARY STENT INTERVENTION (PCI-S);  Surgeon: Lennette Bihari, MD;  Location: [facility] CATH LAB;  Service: Cardiovascular;;  mid LAD PCI --> Promus Element DES 3.5 mm at 24 mm (3.72 mm)   TRANSTHORACIC ECHOCARDIOGRAM  08/2011   EF 40-45%, moderate at K. of mid and distal inferior septum and anterior apical myocardium. Grade 1 diastolic function. -- Followup echocardiogram to reassess his EF was denied by insurance company   TRANSTHORACIC ECHOCARDIOGRAM  07/20/2020    EF 40 to 45% (similar to 2012) stable wall motion normality (severe HK of midapical anterior apical wall-consistent with prior infarct).  GRII DD.  Normal valves. => Stable compared to 08/30/2011.  EF was still 40 to 45%.    Allergies  No Known Allergies  Home Medications    Prior to  Admission medications   Medication Sig Start Date End Date Taking? Authorizing Provider  acetaminophen (TYLENOL) 500 MG tablet Take 500 mg by mouth every 6 (six) hours as needed for moderate pain or headache.    [provider]  Calcium Citrate-Vitamin D (CALCIUM + D PO) Take 1 tablet by mouth daily.    [provider]  carvedilol (COREG) 12.5 MG tablet Take 12.5 mg by mouth 2 (two) times daily. 04/01/19   [provider]  clopidogrel (PLAVIX) 75 MG tablet TAKE 1 TABLET BY MOUTH EVERY DAY 06/05/23   Marykay Lex, MD  diclofenac sodium (VOLTAREN) 1 % GEL Apply 2 g topically 4 (four) times daily as needed (pain).     [provider]  DM-APAP-CPM (CORICIDIN HBP PO) Take 1 tablet by mouth daily as needed (allergies).    [provider]  ezetimibe (ZETIA) 10 MG tablet TAKE 1 TABLET BY MOUTH EVERY DAY 06/05/23   Marykay Lex, MD  gabapentin (NEURONTIN) 100 MG  capsule TAKE 3 CAPSULES BY MOUTH AT BEDTIME 06/03/23   Hyatt, Max T, DPM  HYDROcodone-acetaminophen (NORCO/VICODIN) 5-325 MG tablet Take 1-2 tablets by mouth every 4 (four) hours as needed for moderate pain. 10/27/22   Joseph Art, DO  hydroxychloroquine (PLAQUENIL) 200 MG tablet Take 1 tablet (200 mg total) by mouth daily. 06/15/20   Serena Croissant, MD  leflunomide (ARAVA) 20 MG tablet Take 20 mg by mouth daily. 01/09/21   [provider]  Melatonin 5 MG CAPS Take 5 mg by mouth at bedtime.    [provider]  nitroGLYCERIN (NITROSTAT) 0.4 MG SL tablet PLACE 1 TABLET UNDER THE TONGUE EVERY 5 MINUTES AS NEEDED FOR CHEST PAIN. 08/01/15   Marykay Lex, MD  rosuvastatin (CRESTOR) 20 MG tablet TAKE 1 TABLET BY MOUTH EVERY DAY 04/24/23   Marykay Lex, MD  traZODone (DESYREL) 100 MG tablet Take 100 mg by mouth at bedtime.     [provider]    Physical Exam    Vital Signs:  Margeaux Massie does not have vital signs available for review today.  Given telephonic nature of  communication, physical exam is limited. AAOx3. NAD. Normal affect.  Speech and respirations are unlabored.  Accessory Clinical Findings    None  Assessment & Plan    1.  Preoperative Cardiovascular Risk Assessment: -Patient's RCRI score is 6.6%  -.hcpo  The patient was advised that if she develops new symptoms prior to surgery to contact our office to arrange for a follow-up visit, and she verbalized understanding.    A copy of this note will be routed to requesting surgeon.  Time:   Today, I have spent 3 minutes with the patient with telehealth technology discussing medical history, symptoms, and management plan.     Napoleon Form, Leodis Rains, NP  10/03/2023, 7:59 AM

## 2023-10-15 ENCOUNTER — Other Ambulatory Visit: Payer: Self-pay | Admitting: Cardiology

## 2023-10-21 ENCOUNTER — Telehealth: Payer: Self-pay | Admitting: *Deleted

## 2023-10-21 ENCOUNTER — Telehealth: Payer: Self-pay

## 2023-10-21 DIAGNOSIS — M5416 Radiculopathy, lumbar region: Secondary | ICD-10-CM | POA: Diagnosis not present

## 2023-10-21 NOTE — Telephone Encounter (Signed)
 Preop televisit now scheduled, med rec and consent done.

## 2023-10-21 NOTE — Telephone Encounter (Signed)
   Pre-operative Risk Assessment    Patient Name: Samantha Mcbride  DOB: Dec 27, 1950 MRN: [id number]   Date of last office visit: Telephone clearance/10/03/2023 Date of next office visit: None    Request for Surgical Clearance    Procedure:   Lumbar Epidural Steroid Injection  Date of Surgery:  Clearance TBD                                 Surgeon:  Dr. Deatrice Manus Surgeon's Group or Practice Name:  [facility] Phone number:  [phone] Fax number:  [phone]   Type of Clearance Requested:   - Medical  - Pharmacy:  Hold Clopidogrel  (Plavix ) 7 days prior and restart day after injection.    Type of Anesthesia:  None    Additional requests/questions:    Signed, Edsel Grayce Sanders   10/21/2023, 10:32 AM

## 2023-10-21 NOTE — Telephone Encounter (Signed)
 Samantha Mcbride,   Samantha Mcbride 73 year old female is requesting preoperative cardiac evaluation for lumbar epidural steroid injection.  You recently contacted her to do virtual phone appointment on 10/03/2023.  Are you able to comment on cardiac risk based off of her phone evaluation?  Thank you for your help.  Please direct your response to CV DIV preop pool.  Josefa HERO. Nafisa Olds NP-C     10/21/2023, 10:48 AM [facility] [address] Office [phone] Fax [phone]

## 2023-10-21 NOTE — Telephone Encounter (Signed)
  Patient Consent for Virtual Visit        Samantha Mcbride has provided verbal consent on 10/21/2023 for a virtual visit (video or telephone).   CONSENT FOR VIRTUAL VISIT FOR:  Samantha Mcbride  By participating in this virtual visit I agree to the following:  I hereby voluntarily request, consent and authorize [facility] and its employed or contracted physicians, physician assistants, nurse practitioners or other licensed health care professionals (the Practitioner), to provide me with telemedicine health care services (the "Services) as deemed necessary by the treating Practitioner. I acknowledge and consent to receive the Services by the Practitioner via telemedicine. I understand that the telemedicine visit will involve communicating with the Practitioner through live audiovisual communication technology and the disclosure of certain medical information by electronic transmission. I acknowledge that I have been given the opportunity to request an in-person assessment or other available alternative prior to the telemedicine visit and am voluntarily participating in the telemedicine visit.  I understand that I have the right to withhold or withdraw my consent to the use of telemedicine in the course of my care at any time, without affecting my right to future care or treatment, and that the Practitioner or I may terminate the telemedicine visit at any time. I understand that I have the right to inspect all information obtained and/or recorded in the course of the telemedicine visit and may receive copies of available information for a reasonable fee.  I understand that some of the potential risks of receiving the Services via telemedicine include:  Delay or interruption in medical evaluation due to technological equipment failure or disruption; Information transmitted may not be sufficient (e.g. poor resolution of images) to allow for appropriate medical decision making by the  Practitioner; and/or  In rare instances, security protocols could fail, causing a breach of personal health information.  Furthermore, I acknowledge that it is my responsibility to provide information about my medical history, conditions and care that is complete and accurate to the best of my ability. I acknowledge that Practitioner's advice, recommendations, and/or decision may be based on factors not within their control, such as incomplete or inaccurate data provided by me or distortions of diagnostic images or specimens that may result from electronic transmissions. I understand that the practice of medicine is not an exact science and that Practitioner makes no warranties or guarantees regarding treatment outcomes. I acknowledge that a copy of this consent can be made available to me via my patient portal [facility] MyChart), or I can request a printed copy by calling the office of [facility].    I understand that my insurance will be billed for this visit.   I have read or had this consent read to me. I understand the contents of this consent, which adequately explains the benefits and risks of the Services being provided via telemedicine.  I have been provided ample opportunity to ask questions regarding this consent and the Services and have had my questions answered to my satisfaction. I give my informed consent for the services to be provided through the use of telemedicine in my medical care

## 2023-10-23 DIAGNOSIS — M1990 Unspecified osteoarthritis, unspecified site: Secondary | ICD-10-CM | POA: Diagnosis not present

## 2023-10-23 DIAGNOSIS — N1832 Chronic kidney disease, stage 3b: Secondary | ICD-10-CM | POA: Diagnosis not present

## 2023-10-23 DIAGNOSIS — Z79899 Other long term (current) drug therapy: Secondary | ICD-10-CM | POA: Diagnosis not present

## 2023-10-23 DIAGNOSIS — M810 Age-related osteoporosis without current pathological fracture: Secondary | ICD-10-CM | POA: Diagnosis not present

## 2023-10-23 DIAGNOSIS — D649 Anemia, unspecified: Secondary | ICD-10-CM | POA: Diagnosis not present

## 2023-10-23 DIAGNOSIS — M0579 Rheumatoid arthritis with rheumatoid factor of multiple sites without organ or systems involvement: Secondary | ICD-10-CM | POA: Diagnosis not present

## 2023-10-23 DIAGNOSIS — M549 Dorsalgia, unspecified: Secondary | ICD-10-CM | POA: Diagnosis not present

## 2023-10-23 DIAGNOSIS — M79643 Pain in unspecified hand: Secondary | ICD-10-CM | POA: Diagnosis not present

## 2023-11-06 ENCOUNTER — Ambulatory Visit: Payer: Medicare Other | Attending: Cardiology

## 2023-11-06 DIAGNOSIS — Z01810 Encounter for preprocedural cardiovascular examination: Secondary | ICD-10-CM

## 2023-11-06 NOTE — Progress Notes (Signed)
Virtual Visit via Telephone Note   Because of Samantha Mcbride's co-morbid illnesses, she is at least at moderate risk for complications without adequate follow up.  This format is felt to be most appropriate for this patient at this time.  The patient did not have access to video technology/had technical difficulties with video requiring transitioning to audio format only (telephone).  All issues noted in this document were discussed and addressed.  No physical exam could be performed with this format.  Please refer to the patient's chart for her consent to telehealth for [facility].  Evaluation Performed:  Preoperative cardiovascular risk assessment _____________   Date:  11/06/2023   [name], DOB 1950-12-01, MRN [id number] Patient Location:  Home Provider location:   Office  Primary Care Provider:  Tally Joe, MD Primary Cardiologist:  Bryan Lemma, MD  Chief Complaint / Patient Profile   73 y.o. y/o female with a h/o CAD s/p anterior MI 2012 with PCI of LAD, HLD, HTN, history of breast CAD, CKD stage IV, obesity who is pending epidural ESI and presents today for telephonic preoperative cardiovascular risk assessment.   History of Present Illness    [name] is a 73 y.o. female who presents via audio/video conferencing for a telehealth visit today.  Pt was last seen in cardiology clinic on 07/03/2023 by Dr. Herbie Baltimore.  At that time Tamsin Failing was doing well with no new cardiac complaints. The patient is now pending procedure as outlined above. Since her last visit, she has been doing well with no new cardiac complaints since her previous visit.  She denies chest pain, shortness of breath, lower extremity edema, fatigue, palpitations, melena, hematuria, hemoptysis, diaphoresis, weakness, presyncope, syncope, orthopnea, and PND.   Past Medical History    Past Medical History:  Diagnosis Date   Anemia    iron deficiency anemia    CAD S/P  percutaneous coronary angioplasty 08/2011   Promus DES - mid LAD 3.5 mm x 24 mm (3.72 mm)    Cancer (HCC)    Chronic kidney disease    stage 4 kidney disease per pt dx 02/2019   Complication of anesthesia    Dyslipidemia, goal LDL below 70     on statin, close to goal   Family history of breast cancer    Family history of skin cancer    Former moderate cigarette smoker (10-19 per day)    Glucose intolerance (impaired glucose tolerance)    H/O thyroid nodule    Benign   History 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 --> Promus DES =>  EF 40 to 45% (similar to 2012) stable wall motion normality (severe HK of midapical anterior apical wall-consistent with prior infarct).  GRII DD.  Normal valves. => Stable compared to 08/30/2011.  EF was still 40 to 45%.   Hypertension    Personal history of radiation therapy    PONV (postoperative nausea and vomiting)    no issues with surgery on 05-11-2019   Rheumatoid arthritis (HCC)    managed on humira    SOB (shortness of breath) on exertion    reports "its been that way since my heart attack " repots no recurrence of MI sx since that time    Past Surgical History:  Procedure Laterality Date   ABDOMINAL AORTAGRAM N/A 08/27/2011   Procedure: ABDOMINAL Ronny Flurry;  Surgeon: Lennette Bihari, MD;  Location: [facility] CATH LAB;  Service: Cardiovascular;  Laterality: N/A;   ANKLE SURGERY Right 1995   per pt ankle surgery here at [facility]    APPENDECTOMY     BREAST EXCISIONAL BIOPSY Left    BREAST LUMPECTOMY Right 07/2020   BREAST LUMPECTOMY WITH RADIOACTIVE SEED AND SENTINEL LYMPH NODE BIOPSY Right 08/05/2020   Procedure: RIGHT BREAST LUMPECTOMY WITH RADIOACTIVE SEED AND SENTINEL LYMPH NODE BIOPSY;  Surgeon: Griselda Miner, MD;  Location: [facility] OR;  Service: General;  Laterality: Right;   CYSTOSCOPY WITH RETROGRADE PYELOGRAM, URETEROSCOPY AND STENT PLACEMENT Bilateral 05/11/2019    Procedure: CYSTOSCOPY WITH RETROGRADE PYELOGRAM,  AND STENT PLACEMENT;  Surgeon: Crista Elliot, MD;  Location: [facility] ORS;  Service: Urology;  Laterality: Bilateral;   CYSTOSCOPY/URETEROSCOPY/HOLMIUM LASER/STENT PLACEMENT Bilateral 05/25/2019   Procedure: CYSTOSCOPY BILATERAL URETEROSCOPY/HOLMIUM LASER/STENT PLACEMENT;  Surgeon: Crista Elliot, MD;  Location: [facility] ORS;  Service: Urology;  Laterality: Bilateral;   INTRAMEDULLARY (IM) NAIL INTERTROCHANTERIC Left 10/18/2022   Procedure: INTRAMEDULLARY NAILING OF LEFT FEMUR;  Surgeon: Myrene Galas, MD;  Location: [facility] OR;  Service: Orthopedics;  Laterality: Left;   LEFT HEART CATHETERIZATION WITH CORONARY ANGIOGRAM N/A 08/27/2011   Procedure: LEFT HEART CATHETERIZATION WITH CORONARY ANGIOGRAM;  Surgeon: Lennette Bihari, MD;  Location[facility] C[facility] LAB;  Service: Cardiovascular;;For anterior STEMI/cardiac arrest -- 100% mid LAD occlusion   PERCUTANEOUS CORONARY STENT INTERVENTION (PCI-S) N/A 08/27/2011   Procedure: PERCUTANEOUS CORONARY STENT INTERVENTION (PCI-S);  Surgeon: Lennette Bihari, MD;  Location: [facility] CATH LAB;  Service: Cardiovascular;;  mid LAD PCI --> Promus Element DES 3.5 mm at 24 mm (3.72 mm)   TRANSTHORACIC ECHOCARDIOGRAM  08/2011   EF 40-45%, moderate at K. of mid and distal inferior septum and anterior apical myocardium. Grade 1 diastolic function. -- Followup echocardiogram to reassess his EF was denied by insurance company   TRANSTHORACIC ECHOCARDIOGRAM  07/20/2020    EF 40 to 45% (similar to 2012) stable wall motion normality (severe HK of midapical anterior apical wall-consistent with prior infarct).  GRII DD.  Normal valves. => Stable compared to 08/30/2011.  EF was still 40 to 45%.    Allergies  No Known Allergies  Home Medications    Prior to Admission medications   Medication Sig Start Date End Date Taking? Authorizing Provider  acetaminophen (TYLENOL) 500 MG tablet Take 500 mg by mouth every 6 (six) hours as needed for moderate  pain or headache.    [provider]  Calcium Citrate-Vitamin D (CALCIUM + D PO) Take 1 tablet by mouth daily.    [provider]  carvedilol (COREG) 12.5 MG tablet Take 12.5 mg by mouth 2 (two) times daily. 04/01/19   [provider]  clopidogrel (PLAVIX) 75 MG tablet TAKE 1 TABLET BY MOUTH EVERY DAY 06/05/23   Marykay Lex, MD  diclofenac sodium (VOLTAREN) 1 % GEL Apply 2 g topically 4 (four) times daily as needed (pain).     [provider]  DM-APAP-CPM (CORICIDIN HBP PO) Take 1 tablet by mouth daily as needed (allergies).    [provider]  ezetimibe (ZETIA) 10 MG tablet TAKE 1 TABLET BY MOUTH EVERY DAY 06/05/23   Marykay Lex, MD  gabapentin (NEURONTIN) 100 MG capsule TAKE 3 CAPSULES BY MOUTH AT BEDTIME 06/03/23   Hyatt, Max T, DPM  HYDROcodone-acetaminophen (NORCO/VICODIN) 5-325 MG tablet Take 1-2 tablets by mouth every 4 (four) hours as needed for moderate pain. 10/27/22   Joseph Art, DO  hydroxychloroquine (PLAQUENIL)  200 MG tablet Take 1 tablet (200 mg total) by mouth daily. 06/15/20   Serena Croissant, MD  leflunomide (ARAVA) 20 MG tablet Take 20 mg by mouth daily. 01/09/21   [provider]  Melatonin 5 MG CAPS Take 5 mg by mouth at bedtime.    [provider]  nitroGLYCERIN (NITROSTAT) 0.4 MG SL tablet PLACE 1 TABLET UNDER THE TONGUE EVERY 5 MINUTES AS NEEDED FOR CHEST PAIN. 08/01/15   Marykay Lex, MD  rosuvastatin (CRESTOR) 20 MG tablet TAKE 1 TABLET BY MOUTH EVERY DAY 10/15/23   Marykay Lex, MD  traZODone (DESYREL) 100 MG tablet Take 100 mg by mouth at bedtime.     [provider]    Physical Exam    Vital Signs:  Ninah Manzueta does not have vital signs available for review today.  Given telephonic nature of communication, physical exam is limited. AAOx3. NAD. Normal affect.  Speech and respirations are unlabored.  Accessory Clinical Findings    None  Assessment & Plan    1.   Preoperative Cardiovascular Risk Assessment: -Patient's RCRI score is 6.6%  The patient affirms she has been doing well without any new cardiac symptoms. They are able to achieve 4 METS without cardiac limitations. Therefore, based on ACC/AHA guidelines, the patient would be at acceptable risk for the planned procedure without further cardiovascular testing. The patient was advised that if she develops new symptoms prior to surgery to contact our office to arrange for a follow-up visit, and she verbalized understanding.   Okay to hold Plavix 5 to 7 days preop for surgeries or procedures. 5 days for minor procedures in 7 days for high risk    The patient was advised that if she develops new symptoms prior to surgery to contact our office to arrange for a follow-up visit, and she verbalized understanding.   A copy of this note will be routed to requesting surgeon.  Time:   Today, I have spent 6 minutes with the patient with telehealth technology discussing medical history, symptoms, and management plan.     Napoleon Form, Leodis Rains, NP  11/06/2023, 7:08 AM

## 2023-11-13 DIAGNOSIS — M25512 Pain in left shoulder: Secondary | ICD-10-CM | POA: Diagnosis not present

## 2023-11-13 DIAGNOSIS — M542 Cervicalgia: Secondary | ICD-10-CM | POA: Diagnosis not present

## 2023-11-30 DIAGNOSIS — M79602 Pain in left arm: Secondary | ICD-10-CM | POA: Diagnosis not present

## 2023-11-30 DIAGNOSIS — M47812 Spondylosis without myelopathy or radiculopathy, cervical region: Secondary | ICD-10-CM | POA: Diagnosis not present

## 2023-11-30 DIAGNOSIS — D6869 Other thrombophilia: Secondary | ICD-10-CM | POA: Diagnosis not present

## 2023-11-30 DIAGNOSIS — M5030 Other cervical disc degeneration, unspecified cervical region: Secondary | ICD-10-CM | POA: Diagnosis not present

## 2023-11-30 DIAGNOSIS — I2510 Atherosclerotic heart disease of native coronary artery without angina pectoris: Secondary | ICD-10-CM | POA: Diagnosis not present

## 2023-12-23 DIAGNOSIS — M542 Cervicalgia: Secondary | ICD-10-CM | POA: Diagnosis not present

## 2023-12-31 DIAGNOSIS — M542 Cervicalgia: Secondary | ICD-10-CM | POA: Diagnosis not present

## 2024-01-14 DIAGNOSIS — M5416 Radiculopathy, lumbar region: Secondary | ICD-10-CM | POA: Diagnosis not present

## 2024-01-14 DIAGNOSIS — M51362 Other intervertebral disc degeneration, lumbar region with discogenic back pain and lower extremity pain: Secondary | ICD-10-CM | POA: Diagnosis not present

## 2024-01-20 DIAGNOSIS — M5416 Radiculopathy, lumbar region: Secondary | ICD-10-CM | POA: Diagnosis not present

## 2024-01-24 ENCOUNTER — Telehealth: Payer: Self-pay

## 2024-01-24 NOTE — Telephone Encounter (Signed)
 Patient has been scheduled for tele appt

## 2024-01-24 NOTE — Telephone Encounter (Signed)
 Patient has been scheduled med rec and consent done     Patient Consent for Virtual Visit         Samantha Mcbride has provided verbal consent on 01/24/2024 for a virtual visit (video or telephone).   CONSENT FOR VIRTUAL VISIT FOR:  Samantha Mcbride  By participating in this virtual visit I agree to the following:  I hereby voluntarily request, consent and authorize [facility] and its employed or contracted physicians, physician assistants, nurse practitioners or other licensed health care professionals (the Practitioner), to provide me with telemedicine health care services (the "Services") as deemed necessary by the treating Practitioner. I acknowledge and consent to receive the Services by the Practitioner via telemedicine. I understand that the telemedicine visit will involve communicating with the Practitioner through live audiovisual communication technology and the disclosure of certain medical information by electronic transmission. I acknowledge that I have been given the opportunity to request an in-person assessment or other available alternative prior to the telemedicine visit and am voluntarily participating in the telemedicine visit.  I understand that I have the right to withhold or withdraw my consent to the use of telemedicine in the course of my care at any time, without affecting my right to future care or treatment, and that the Practitioner or I may terminate the telemedicine visit at any time. I understand that I have the right to inspect all information obtained and/or recorded in the course of the telemedicine visit and may receive copies of available information for a reasonable fee.  I understand that some of the potential risks of receiving the Services via telemedicine include:  Delay or interruption in medical evaluation due to technological equipment failure or disruption; Information transmitted may not be sufficient (e.g. poor resolution of images) to allow  for appropriate medical decision making by the Practitioner; and/or  In rare instances, security protocols could fail, causing a breach of personal health information.  Furthermore, I acknowledge that it is my responsibility to provide information about my medical history, conditions and care that is complete and accurate to the best of my ability. I acknowledge that Practitioner's advice, recommendations, and/or decision may be based on factors not within their control, such as incomplete or inaccurate data provided by me or distortions of diagnostic images or specimens that may result from electronic transmissions. I understand that the practice of medicine is not an exact science and that Practitioner makes no warranties or guarantees regarding treatment outcomes. I acknowledge that a copy of this consent can be made available to me via my patient portal [facility] MyChart), or I can request a printed copy by calling the office of [facility].    I understand that my insurance will be billed for this visit.   I have read or had this consent read to me. I understand the contents of this consent, which adequately explains the benefits and risks of the Services being provided via telemedicine.  I have been provided ample opportunity to ask questions regarding this consent and the Services and have had my questions answered to my satisfaction. I give my informed consent for the services to be provided through the use of telemedicine in my medical care

## 2024-01-24 NOTE — Telephone Encounter (Signed)
   Pre-operative Risk Assessment    Patient Name: Samantha Mcbride  DOB: 1951-08-19 MRN: [id number]   Date of last office visit: 07/03/23 Bryan Lemma, MD Date of next office visit: NONE  Request for Surgical Clearance    Procedure:  Dental Extraction - Amount of Teeth to be Pulled:  15 TEETH  Date of Surgery:  Clearance TBD                                Surgeon:  NOT INDICATED Surgeon's Group or Practice Name:  [facility] Phone number:  [phone] Fax number:  [phone]   Type of Clearance Requested:   - Medical  - Pharmacy:  Hold Clopidogrel (Plavix)     Type of Anesthesia:  Local    Additional requests/questions:    SignedMarlow Baars   01/24/2024, 12:39 PM

## 2024-01-24 NOTE — Telephone Encounter (Signed)
   Name: Samantha Mcbride  DOB: 07-08-1951  MRN: [id number]  Primary Cardiologist: Bryan Lemma, MD   Preoperative team, please contact this patient and set up a phone call appointment for further preoperative risk assessment. Please obtain consent and complete medication review. Thank you for your help.  I confirm that guidance regarding antiplatelet and oral anticoagulation therapy has been completed and, if necessary, noted below.  OK to hold plavix 5-7 days for 15 extractions.  I also confirmed the patient resides in the state of [place]. As per [facility] Board telemedicine laws, the patient must reside in the state in which the provider is licensed.   Marcelino Duster, PA 01/24/2024, 1:08 PM [facility]

## 2024-01-29 NOTE — Progress Notes (Unsigned)
 Virtual Visit via Telephone Note   Because of Samantha Mcbride co-morbid illnesses, she is at least at moderate risk for complications without adequate follow up.  This format is felt to be most appropriate for this patient at this time.  Due to technical limitations with video connection (technology), today's appointment will be conducted as an audio only telehealth visit, and Samantha Mcbride verbally agreed to proceed in this manner.   All issues noted in this document were discussed and addressed.  No physical exam could be performed with this format.  Evaluation Performed:  Preoperative cardiovascular risk assessment _____________   Date:  01/29/2024   [name], DOB 05/09/1951, MRN [id number] Patient Location:  Home Provider location:   Office  Primary Care Provider:  Tally Joe, MD Primary Cardiologist:  Bryan Lemma, MD  Chief Complaint / Patient Profile   73 y.o. y/o female with a h/o CAD s/p anterior MI 2012 with PCI of LAD, HLD, HTN, history of breast CAD, CKD stage IV, obesity  who is pending 15 teeth dental extraction and presents today for telephonic preoperative cardiovascular risk assessment.  History of Present Illness    Samantha Mcbride is a 73 y.o. female who presents via audio/video conferencing for a telehealth visit today.  Pt was last seen in cardiology clinic on 07/03/2023 by Dr. Herbie Baltimore.  At that time Samantha Mcbride was doing well with no new cardiac complaints.  The patient is now pending procedure as outlined above. Since her last visit, she has been doing well with no new cardiac complaints.  She is able to complete greater than 4 METS of activity without limitations.  She did fracture her femur last year and is limited with certain movements such as walking upstairs.  She reports that her blood pressure was checked most recently at a provider office and it was normal.  She denies chest pain, shortness of breath, lower extremity edema,  fatigue, palpitations, melena, hematuria, hemoptysis, diaphoresis, weakness, presyncope, syncope, orthopnea, and PND.   Past Medical History    Past Medical History:  Diagnosis Date   Anemia    iron deficiency anemia    CAD S/P percutaneous coronary angioplasty 08/2011   Promus DES - mid LAD 3.5 mm x 24 mm (3.72 mm)    Cancer (HCC)    Chronic kidney disease    stage 4 kidney disease per pt dx 02/2019   Complication of anesthesia    Dyslipidemia, goal LDL below 70     on statin, close to goal   Family history of breast cancer    Family history of skin cancer    Former moderate cigarette smoker (10-19 per day)    Glucose intolerance (impaired glucose tolerance)    H/O thyroid nodule    Benign   History 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 --> Promus DES =>  EF 40 to 45% (similar to 2012) stable wall motion normality (severe HK of midapical anterior apical wall-consistent with prior infarct).  GRII DD.  Normal valves. => Stable compared to 08/30/2011.  EF was still 40 to 45%.   Hypertension    Personal history of radiation therapy    PONV (postoperative nausea and vomiting)    no issues with surgery on 05-11-2019   Rheumatoid arthritis (HCC)    managed on humira    SOB (shortness of breath) on exertion    reports "its  been that way since my heart attack " repots no recurrence of MI sx since that time    Past Surgical History:  Procedure Laterality Date   ABDOMINAL AORTAGRAM N/A 08/27/2011   Procedure: ABDOMINAL Tommi Fraise;  Surgeon: Millicent Ally, MD;  Location: [facility] CATH LAB;  Service: Cardiovascular;  Laterality: N/A;   ANKLE SURGERY Right 1995   per pt ankle surgery here at [facility]    APPENDECTOMY     BREAST EXCISIONAL BIOPSY Left    BREAST LUMPECTOMY Right 07/2020   BREAST LUMPECTOMY WITH RADIOACTIVE SEED AND SENTINEL LYMPH NODE BIOPSY Right 08/05/2020   Procedure: RIGHT BREAST LUMPECTOMY WITH  RADIOACTIVE SEED AND SENTINEL LYMPH NODE BIOPSY;  Surgeon: Caralyn Chandler, MD;  Location: [facility] OR;  Service: General;  Laterality: Right;   CYSTOSCOPY WITH RETROGRADE PYELOGRAM, URETEROSCOPY AND STENT PLACEMENT Bilateral 05/11/2019   Procedure: CYSTOSCOPY WITH RETROGRADE PYELOGRAM,  AND STENT PLACEMENT;  Surgeon: Samson Croak, MD;  Location: [facility] ORS;  Service: Urology;  Laterality: Bilateral;   CYSTOSCOPY/URETEROSCOPY/HOLMIUM LASER/STENT PLACEMENT Bilateral 05/25/2019   Procedure: CYSTOSCOPY BILATERAL URETEROSCOPY/HOLMIUM LASER/STENT PLACEMENT;  Surgeon: Samson Croak, MD;  Location: [facility] ORS;  Service: Urology;  Laterality: Bilateral;   INTRAMEDULLARY (IM) NAIL INTERTROCHANTERIC Left 10/18/2022   Procedure: INTRAMEDULLARY NAILING OF LEFT FEMUR;  Surgeon: Hardy Lia, MD;  Location: [facility] OR;  Service: Orthopedics;  Laterality: Left;   LEFT HEART CATHETERIZATION WITH CORONARY ANGIOGRAM N/A 08/27/2011   Procedure: LEFT HEART CATHETERIZATION WITH CORONARY ANGIOGRAM;  Surgeon: Millicent Ally, MD;  Location[facility] LAB;  Service: Cardiovascular;;For anterior STEMI/cardiac arrest -- 100% mid LAD occlusion   PERCUTANEOUS CORONARY STENT INTERVENTION (PCI-S) N/A 08/27/2011   Procedure: PERCUTANEOUS CORONARY STENT INTERVENTION (PCI-S);  Surgeon: Millicent Ally, MD;  Location: [facility] CATH LAB;  Service: Cardiovascular;;  mid LAD PCI --> Promus Element DES 3.5 mm at 24 mm (3.72 mm)   TRANSTHORACIC ECHOCARDIOGRAM  08/2011   EF 40-45%, moderate at K. of mid and distal inferior septum and anterior apical myocardium. Grade 1 diastolic function. -- Followup echocardiogram to reassess his EF was denied by insurance company   TRANSTHORACIC ECHOCARDIOGRAM  07/20/2020    EF 40 to 45% (similar to 2012) stable wall motion normality (severe HK of midapical anterior apical wall-consistent with prior infarct).  GRII DD.  Normal valves. => Stable compared to 08/30/2011.  EF was still 40 to 45%.    Allergies  No Known  Allergies  Home Medications    Prior to Admission medications   Medication Sig Start Date End Date Taking? Authorizing Provider  acetaminophen (TYLENOL) 500 MG tablet Take 500 mg by mouth every 6 (six) hours as needed for moderate pain or headache.    [provider]  Calcium Citrate-Vitamin D (CALCIUM + D PO) Take 1 tablet by mouth daily.    [provider]  carvedilol (COREG) 12.5 MG tablet Take 12.5 mg by mouth 2 (two) times daily. 04/01/19   [provider]  clopidogrel (PLAVIX) 75 MG tablet TAKE 1 TABLET BY MOUTH EVERY DAY 06/05/23   Arleen Lacer, MD  diclofenac sodium (VOLTAREN) 1 % GEL Apply 2 g topically 4 (four) times daily as needed (pain).     [provider]  DM-APAP-CPM (CORICIDIN HBP PO) Take 1 tablet by mouth daily as needed (allergies).    [provider]  ezetimibe (ZETIA) 10 MG tablet TAKE 1 TABLET BY MOUTH EVERY DAY 06/05/23   Arleen Lacer, MD  gabapentin (NEURONTIN) 100 MG  capsule TAKE 3 CAPSULES BY MOUTH AT BEDTIME 06/03/23   Hyatt, Max T, DPM  HYDROcodone-acetaminophen (NORCO/VICODIN) 5-325 MG tablet Take 1-2 tablets by mouth every 4 (four) hours as needed for moderate pain. 10/27/22   Vann, Jessica U, DO  hydroxychloroquine (PLAQUENIL) 200 MG tablet Take 1 tablet (200 mg total) by mouth daily. 06/15/20   Gudena, Vinay, MD  leflunomide (ARAVA) 20 MG tablet Take 20 mg by mouth daily. 01/09/21   [provider]  Melatonin 5 MG CAPS Take 5 mg by mouth at bedtime.    [provider]  nitroGLYCERIN (NITROSTAT) 0.4 MG SL tablet PLACE 1 TABLET UNDER THE TONGUE EVERY 5 MINUTES AS NEEDED FOR CHEST PAIN. 08/01/15   Arleen Lacer, MD  rosuvastatin (CRESTOR) 20 MG tablet TAKE 1 TABLET BY MOUTH EVERY DAY 10/15/23   Arleen Lacer, MD  traZODone (DESYREL) 100 MG tablet Take 100 mg by mouth at bedtime.     [provider]    Physical Exam    Vital Signs:  Samantha Mcbride does not have vital signs available  for review today.  Given telephonic nature of communication, physical exam is limited. AAOx3. NAD. Normal affect.  Speech and respirations are unlabored.  Accessory Clinical Findings    None  Assessment & Plan    1.  Preoperative Cardiovascular Risk Assessment: - Patient's RCRI score is 6.6%  The patient affirms she has been doing well without any new cardiac symptoms. They are able to achieve 5 METS without cardiac limitations. Therefore, based on ACC/AHA guidelines, the patient would be at acceptable risk for the planned procedure without further cardiovascular testing. The patient was advised that if she develops new symptoms prior to surgery to contact our office to arrange for a follow-up visit, and she verbalized understanding.   The patient was advised that if she develops new symptoms prior to surgery to contact our office to arrange for a follow-up visit, and she verbalized understanding.  Patient can hold Plavix 5 to 7 days prior to procedure  A copy of this note will be routed to requesting surgeon.  Time:   Today, I have spent 6 minutes with the patient with telehealth technology discussing medical history, symptoms, and management plan.     Francene Ing, Retha Cast, NP  01/29/2024, 3:14 PM

## 2024-01-30 ENCOUNTER — Ambulatory Visit: Attending: Nurse Practitioner

## 2024-01-30 DIAGNOSIS — Z01810 Encounter for preprocedural cardiovascular examination: Secondary | ICD-10-CM

## 2024-02-28 DIAGNOSIS — M1990 Unspecified osteoarthritis, unspecified site: Secondary | ICD-10-CM | POA: Diagnosis not present

## 2024-02-28 DIAGNOSIS — M79643 Pain in unspecified hand: Secondary | ICD-10-CM | POA: Diagnosis not present

## 2024-02-28 DIAGNOSIS — N1832 Chronic kidney disease, stage 3b: Secondary | ICD-10-CM | POA: Diagnosis not present

## 2024-02-28 DIAGNOSIS — M810 Age-related osteoporosis without current pathological fracture: Secondary | ICD-10-CM | POA: Diagnosis not present

## 2024-02-28 DIAGNOSIS — D649 Anemia, unspecified: Secondary | ICD-10-CM | POA: Diagnosis not present

## 2024-02-28 DIAGNOSIS — M549 Dorsalgia, unspecified: Secondary | ICD-10-CM | POA: Diagnosis not present

## 2024-02-28 DIAGNOSIS — Z79899 Other long term (current) drug therapy: Secondary | ICD-10-CM | POA: Diagnosis not present

## 2024-02-28 DIAGNOSIS — M0579 Rheumatoid arthritis with rheumatoid factor of multiple sites without organ or systems involvement: Secondary | ICD-10-CM | POA: Diagnosis not present

## 2024-03-16 DIAGNOSIS — N184 Chronic kidney disease, stage 4 (severe): Secondary | ICD-10-CM | POA: Diagnosis not present

## 2024-03-18 ENCOUNTER — Other Ambulatory Visit: Payer: Self-pay | Admitting: Family Medicine

## 2024-03-18 DIAGNOSIS — Z1231 Encounter for screening mammogram for malignant neoplasm of breast: Secondary | ICD-10-CM

## 2024-03-27 DIAGNOSIS — D631 Anemia in chronic kidney disease: Secondary | ICD-10-CM | POA: Diagnosis not present

## 2024-03-27 DIAGNOSIS — E876 Hypokalemia: Secondary | ICD-10-CM | POA: Diagnosis not present

## 2024-03-27 DIAGNOSIS — N2581 Secondary hyperparathyroidism of renal origin: Secondary | ICD-10-CM | POA: Diagnosis not present

## 2024-03-27 DIAGNOSIS — I129 Hypertensive chronic kidney disease with stage 1 through stage 4 chronic kidney disease, or unspecified chronic kidney disease: Secondary | ICD-10-CM | POA: Diagnosis not present

## 2024-03-27 DIAGNOSIS — N184 Chronic kidney disease, stage 4 (severe): Secondary | ICD-10-CM | POA: Diagnosis not present

## 2024-04-20 DIAGNOSIS — S99922A Unspecified injury of left foot, initial encounter: Secondary | ICD-10-CM | POA: Diagnosis not present

## 2024-04-20 DIAGNOSIS — M810 Age-related osteoporosis without current pathological fracture: Secondary | ICD-10-CM | POA: Diagnosis not present

## 2024-04-20 DIAGNOSIS — M069 Rheumatoid arthritis, unspecified: Secondary | ICD-10-CM | POA: Diagnosis not present

## 2024-04-20 DIAGNOSIS — E782 Mixed hyperlipidemia: Secondary | ICD-10-CM | POA: Diagnosis not present

## 2024-04-20 DIAGNOSIS — I25119 Atherosclerotic heart disease of native coronary artery with unspecified angina pectoris: Secondary | ICD-10-CM | POA: Diagnosis not present

## 2024-04-20 DIAGNOSIS — M7989 Other specified soft tissue disorders: Secondary | ICD-10-CM | POA: Diagnosis not present

## 2024-04-20 DIAGNOSIS — G2581 Restless legs syndrome: Secondary | ICD-10-CM | POA: Diagnosis not present

## 2024-04-20 DIAGNOSIS — M5416 Radiculopathy, lumbar region: Secondary | ICD-10-CM | POA: Diagnosis not present

## 2024-04-20 DIAGNOSIS — D509 Iron deficiency anemia, unspecified: Secondary | ICD-10-CM | POA: Diagnosis not present

## 2024-04-20 DIAGNOSIS — N184 Chronic kidney disease, stage 4 (severe): Secondary | ICD-10-CM | POA: Diagnosis not present

## 2024-04-20 DIAGNOSIS — M19072 Primary osteoarthritis, left ankle and foot: Secondary | ICD-10-CM | POA: Diagnosis not present

## 2024-04-20 DIAGNOSIS — J432 Centrilobular emphysema: Secondary | ICD-10-CM | POA: Diagnosis not present

## 2024-04-20 DIAGNOSIS — G4700 Insomnia, unspecified: Secondary | ICD-10-CM | POA: Diagnosis not present

## 2024-04-21 DIAGNOSIS — M5416 Radiculopathy, lumbar region: Secondary | ICD-10-CM | POA: Diagnosis not present

## 2024-04-23 DIAGNOSIS — S93492A Sprain of other ligament of left ankle, initial encounter: Secondary | ICD-10-CM | POA: Diagnosis not present

## 2024-04-23 DIAGNOSIS — S92212A Displaced fracture of cuboid bone of left foot, initial encounter for closed fracture: Secondary | ICD-10-CM | POA: Diagnosis not present

## 2024-04-23 DIAGNOSIS — M25551 Pain in right hip: Secondary | ICD-10-CM | POA: Diagnosis not present

## 2024-05-05 DIAGNOSIS — M8588 Other specified disorders of bone density and structure, other site: Secondary | ICD-10-CM | POA: Diagnosis not present

## 2024-05-12 DIAGNOSIS — M810 Age-related osteoporosis without current pathological fracture: Secondary | ICD-10-CM | POA: Diagnosis not present

## 2024-05-13 ENCOUNTER — Ambulatory Visit
Admission: RE | Admit: 2024-05-13 | Discharge: 2024-05-13 | Disposition: A | Discharge status: Home / Self Care | Source: Ambulatory Visit | Attending: Family Medicine | Admitting: Family Medicine

## 2024-05-13 DIAGNOSIS — Z1231 Encounter for screening mammogram for malignant neoplasm of breast: Secondary | ICD-10-CM

## 2024-05-21 DIAGNOSIS — S93492A Sprain of other ligament of left ankle, initial encounter: Secondary | ICD-10-CM | POA: Diagnosis not present

## 2024-05-21 DIAGNOSIS — S92212A Displaced fracture of cuboid bone of left foot, initial encounter for closed fracture: Secondary | ICD-10-CM | POA: Diagnosis not present

## 2024-06-01 DIAGNOSIS — D649 Anemia, unspecified: Secondary | ICD-10-CM | POA: Diagnosis not present

## 2024-06-01 DIAGNOSIS — Z79899 Other long term (current) drug therapy: Secondary | ICD-10-CM | POA: Diagnosis not present

## 2024-06-01 DIAGNOSIS — M1990 Unspecified osteoarthritis, unspecified site: Secondary | ICD-10-CM | POA: Diagnosis not present

## 2024-06-01 DIAGNOSIS — M0579 Rheumatoid arthritis with rheumatoid factor of multiple sites without organ or systems involvement: Secondary | ICD-10-CM | POA: Diagnosis not present

## 2024-06-01 DIAGNOSIS — M810 Age-related osteoporosis without current pathological fracture: Secondary | ICD-10-CM | POA: Diagnosis not present

## 2024-06-01 DIAGNOSIS — N1832 Chronic kidney disease, stage 3b: Secondary | ICD-10-CM | POA: Diagnosis not present

## 2024-06-01 DIAGNOSIS — M549 Dorsalgia, unspecified: Secondary | ICD-10-CM | POA: Diagnosis not present

## 2024-06-01 DIAGNOSIS — M79643 Pain in unspecified hand: Secondary | ICD-10-CM | POA: Diagnosis not present

## 2024-07-01 ENCOUNTER — Other Ambulatory Visit: Payer: Self-pay | Admitting: Cardiology

## 2024-07-03 ENCOUNTER — Encounter (HOSPITAL_BASED_OUTPATIENT_CLINIC_OR_DEPARTMENT_OTHER): Payer: Self-pay

## 2024-07-06 ENCOUNTER — Ambulatory Visit: Attending: Cardiology | Admitting: Cardiology

## 2024-07-06 ENCOUNTER — Other Ambulatory Visit: Payer: Self-pay | Admitting: Cardiology

## 2024-07-06 ENCOUNTER — Encounter: Payer: Self-pay | Admitting: Cardiology

## 2024-07-06 VITALS — BP 115/76 | HR 84 | Ht 61.0 in | Wt 173.0 lb

## 2024-07-06 DIAGNOSIS — I2109 ST elevation (STEMI) myocardial infarction involving other coronary artery of anterior wall: Secondary | ICD-10-CM | POA: Diagnosis not present

## 2024-07-06 DIAGNOSIS — Z9861 Coronary angioplasty status: Secondary | ICD-10-CM | POA: Diagnosis not present

## 2024-07-06 DIAGNOSIS — I255 Ischemic cardiomyopathy: Secondary | ICD-10-CM | POA: Diagnosis not present

## 2024-07-06 DIAGNOSIS — N184 Chronic kidney disease, stage 4 (severe): Secondary | ICD-10-CM

## 2024-07-06 DIAGNOSIS — I472 Ventricular tachycardia, unspecified: Secondary | ICD-10-CM | POA: Diagnosis not present

## 2024-07-06 DIAGNOSIS — E785 Hyperlipidemia, unspecified: Secondary | ICD-10-CM | POA: Diagnosis not present

## 2024-07-06 DIAGNOSIS — I10 Essential (primary) hypertension: Secondary | ICD-10-CM | POA: Diagnosis not present

## 2024-07-06 DIAGNOSIS — R7302 Impaired glucose tolerance (oral): Secondary | ICD-10-CM

## 2024-07-06 DIAGNOSIS — I2510 Atherosclerotic heart disease of native coronary artery without angina pectoris: Secondary | ICD-10-CM

## 2024-07-06 DIAGNOSIS — Z01810 Encounter for preprocedural cardiovascular examination: Secondary | ICD-10-CM

## 2024-07-06 DIAGNOSIS — E669 Obesity, unspecified: Secondary | ICD-10-CM

## 2024-07-06 NOTE — Progress Notes (Signed)
 Cardiology Office Note:  .   Date:  07/14/2024  ID:  Samantha Mcbride, DOB [birth year], MRN [id number] PCP: Seabron Lenis, MD  [facility] Providers Cardiologist:  Lenis Clay, MD     Chief Complaint  Patient presents with   Follow-up    Annual follow-up.  Doing well.   Coronary Artery Disease    Almost 13 years out from MI.  Doing well.    Patient Profile: .     Samantha Mcbride is a mildly obese 73 y.o. female  with a PMH noted below who presents here for annual follow-up and preop assessment at the request of Seabron Lenis, MD.  PMH: CAD (Ant STEMI/ VT -> LAD PCI 08/2011), Mild ICM (EF 40-45%), CKD4, HTN, HLD, RA & h/o Br CA; s/p L Femur Sgx.  (Intramedullary nailing)  I last saw Samantha Mcbride  onSeptember 18 for annual follow-up and she was doing well.  In the interim since [facility] broken leg and underwent intramedullary nailing of the left femur with.  This was a fluke accident because she had just had her elbow drained from her tennis elbow and fell because she could not hold onto something with her arm in a sling.[name]  GERD daughters dog when out of the door and knocked her down so she fell down and broke her leg.  By the time I saw her she is finally starting get around better but was taking it slowly.  She noted it was taking her longer to get places that she usually went.  She TAVR use her rollator walker.  No cardiac symptoms.  To stable exertional dyspnea and edema.  A lot of the dyspnea was related to deconditioning. - In the past, her ACE inhibitor/ARB meds have been discontinued due to renal insufficiency.  We noted a slight upward trend in her LDL.  1 to watch her closely.     Samantha Mcbride was evaluated by Samantha Mcbride in January 2025 via virtual visit for preop assessment for epidural procedure.  She was asymptomatic.  As per my note, able to achieve more than 4 METS and okay to hold Plavix .  Subjective  Discussed the use of AI scribe software for clinical note transcription  with the patient, who gave verbal consent to proceed.  History of Present Illness Samantha Mcbride is a 73 year old female with coronary artery disease and ischemic cardiomyopathy who presents for an annual follow-up.  She has a history of coronary artery disease, having undergone LAD PCI in November 2012, and experiences mild ischemic cardiomyopathy with an ejection fraction of 40-45%.  She is relatively asymptomatic regards standpoint: No chest pain, pressure, or tightness when walking, and no more than usual shortness of breath during activities. No heart racing, skipping, or flip-flopping sensations, and has not required nitroglycerin . No swelling in her ankles, shortness of breath when lying flat, waking up at night due to breathing difficulties, dizziness, or passing out spells.  Hypertension and hyperlipidemia are managed with carvedilol  12.5 mg twice daily, Crestor  20 mg daily, and Zetia  10 mg daily.  She experiences musculoskeletal pain, which has led to weight gain due to decreased activity. She uses melatonin 5 mg for sleep and Voltaren gel for pain relief. She has a history of a hip fracture two years ago and a recent fall in July, resulting in a fractured foot and sprained ankle. She continues to walk with a cane and reports a recent incident where pain in her hip caused  her knee to buckle, leading to a fall.  She is on Plavix  75 mg daily and has not experienced any bleeding or bruising. She also takes leflunomide  20 mg daily and Plaquenil  200 mg for rheumatoid arthritis.  She lives in a [place] townhouse near her daughter. Her granddaughter is studying at [employer] to work with autistic children.    Objective  Past Medical History - Coronary artery disease with history of LAD PCI and VT - Mild ischemic cardiomyopathy with ejection fraction 40-45% - Hypertension - Hyperlipidemia - Rheumatoid arthritis  Surgical History: - LAD PCI (November 2012): Percutaneous coronary  intervention of the left anterior descending artery - Leg surgery (January 2022): Surgery on the leg following a fracture  Medications - Plavix  75 mg daily - Carvedilol  12.5 mg twice a day - Crestor  20 mg daily - Zetia  10 mg daily - Neurontin  100 mg-3 tabs nightly - Arava  20 mg daily - Plaquenil  200 mg - Melatonin 5 mg, and trazodone  nightly - Pain medications: - Tylenol ; - Voltaren gel  Studies Reviewed: [name]   EKG Interpretation Date/Time:  Monday July 06 2024 09:42:58 EDT Ventricular Rate:  84 PR Interval:  152 QRS Duration:  72 QT Interval:  390 QTC Calculation: 460 R Axis:   -26  Text Interpretation: Normal sinus rhythm Anteroseptal infarct , age undetermined When compared with ECG of 17-Oct-2022 14:39, No significant change since last tracing Confirmed by Anner Lenis ([id number]) on 07/06/2024 10:16:07 AM    Lab Results  Component Value Date   CHOL 152 07/08/2023   HDL 64 07/08/2023   LDLCALC 65 07/08/2023   TRIG 132 07/08/2023   CHOLHDL 2.4 07/08/2023   Lab Results  Component Value Date   NA 138 10/25/2022   K 4.0 10/25/2022   CREATININE 1.68 (H) 10/25/2022   EGFR 20.0 06/10/2023   GLUCOSE 103 (H) 10/25/2022   03/16/2024: Hgb 11.0, Cr 1.89  Lab Results  Component Value Date   HGBA1C 5.6 08/07/2013    LEFT HEART CATHETERIZATION WITH CORONARY ANGIOGRAM w/ PERCUTANEOUS CORONARY STENT INTERVENTION (PCI-S) 08/27/2011    Surgeon: Debby DELENA Sor, MD;  Location: [facility] CATH LAB;  Service: Cardiovascular;;For anterior STEMI/cardiac arrest -- 100% mid LAD occlusion mid LAD PCI --> Promus Element DES 3.5 mm at 24 mm (3.72 mm)          TRANSTHORACIC ECHOCARDIOGRAM 07/20/2020     EF 40 to 45% (similar to 2012) stable wall motion normality (severe HK of midapical anterior apical wall-consistent with prior infarct).  GRII DD.  Normal valves. =>  Stable compared to 08/30/2011.  EF was still 40 to 45%.    Risk Assessment/Calculations:             Samantha Mcbride  perioperative risk of a major cardiac event is 0.4% according to the Revised Cardiac Risk Index (RCRI).  Therefore, she is at low risk for perioperative complications.   Her functional capacity is good at 6.27 METs according to the Duke Activity Status Index (DASI). Recommendations: According to ACC/AHA guidelines, no further cardiovascular testing needed.  The patient may proceed to surgery at acceptable risk.   Antiplatelet and/or Anticoagulation Recommendations: Clopidogrel  (Plavix ) can be held for 7 days prior to her surgery and resumed as soon as possible post op.=> Recommend 7-day hold for spinal procedures. No other medications to hold    Physical Exam:   VS:  BP 115/76 (BP Location: Left Arm, Patient Position: Sitting)   Pulse 84   Ht 5' 1 (1.549 m)  Wt 173 lb (78.5 kg)   SpO2 92%   BMI 32.69 kg/m    Wt Readings from Last 3 Encounters:  07/06/24 173 lb (78.5 kg)  07/25/23 170 lb 3.2 oz (77.2 kg)  07/03/23 165 lb 9.6 oz (75.1 kg)     GEN: Well nourished, well groomed; in no acute distress; mildly obese NECK: No JVD; No carotid bruits CARDIAC: Normal S1, S2; RRR, no murmurs, rubs, gallops RESPIRATORY:  Clear to auscultation without rales, wheezing or rhonchi ; nonlabored, good air movement. ABDOMEN: Soft, non-tender, non-distended EXTREMITIES:  No edema; No deformity     ASSESSMENT AND PLAN: .    Problem List Items Addressed This Visit       Cardiology Problems   CAD S/P percutaneous coronary angioplasty (Chronic)   Coronary artery disease with previous LAD PCI. Mild ischemic cardiomyopathy with EF 40-45%. No symptoms of ischemia. Well-managed on current medications. - Continue Plavix  75 mg daily, hold 5-7 days before surgeries or procedures. - Continue carvedilol  12.5 mg twice daily. - Order blood work including cholesterol levels.      Relevant Orders   Lipid panel   Comprehensive metabolic panel with GFR   Hemoglobin A1c   Essential hypertension (Chronic)    Blood pressure well-controlled on current regimen. - Continue carvedilol  12.5 mg twice daily.      H/O Anterior STEMI:  Proximal LAD  insertion of a 3.5x24 mm Promus element DES (08/2011) (Chronic)   73 year old this November from her MI with cardiac arrest. Mild ischemic cardiomyopathy with EF of 40 to 45% but no active anginal or CHF symptoms. No further symptoms      Relevant Orders   Lipid panel   Comprehensive metabolic panel with GFR   Hyperlipidemia with target low density lipoprotein (LDL) cholesterol less than 55 mg/dL (Chronic)   Cholesterol levels require monitoring.  I do not have labs since last December.  LDL was still relatively controlled at 65. - Continue Crestor  20 mg daily. - Continue Zetia  10 mg daily. - Order cholesterol levels with blood work.      Relevant Orders   Lipid panel   Comprehensive metabolic panel with GFR   Hemoglobin A1c   Ischemic cardiomyopathy (Chronic)   Ischemic cardiomyopathy following anterior STEMI.  EF stable at 40 to 45%. NYHA class II symptoms of Tikosyn exertional dyspnea from COPD.  No PND orthopnea trivial edema. -Continue carvedilol  12.5 mg twice daily.  - She is no longer on ARB/ACE inhibitor/ARNI or MRA because of concerns of renal insufficiency.   - Euvolemic, not requiring diuretic.      Ventricular tachycardia, sustained; Peri-infarct.  No further episodes (Chronic)   In the setting of MI.  No further episodes.        Other   Chronic kidney disease, stage 4 (severe) (HCC) (Chronic)   Followed by nephrology.  Most recent creatinine I have is 1.89 from June 2025  Per nephrology, not on a similar ARB or MRA. Will defer to nephrology, but may potentially consider low-dose ARB if pressures were elevated.      Glucose intolerance (impaired glucose tolerance) (Chronic)   Relevant Orders   Lipid panel   Comprehensive metabolic panel with GFR   Hemoglobin A1c   Obesity (BMI 30-39.9) (Chronic)   Preoperative  cardiovascular examination - Primary   Revised Cardiac Risk Index: High Risk Surgery: no; relatively low risk Potential epidural injection Active CAD: no; no active chest pain or pressure with rest or exertion. CHF: no; stable NYHA  class II symptoms more limited by COPD and deconditioning. Cerebrovascular Disease: no; number report Diabetes: no; On Insulin: no CKD (Cr >~ 2): No; Cr 1.89 as of this summer.  Samantha Mcbride's perioperative risk of a major cardiac event is 0.4% according to the Revised Cardiac Risk Index (RCRI).  Therefore, she is at low risk for perioperative complications.   Her functional capacity is good at 6.27 METs according to the Duke Activity Status Index (DASI). Recommendations: According to ACC/AHA guidelines, no further cardiovascular testing needed.  The patient may proceed to surgery at acceptable risk.   Antiplatelet and/or Anticoagulation Recommendations: Clopidogrel  (Plavix ) can be held for 7 days prior to her surgery and resumed as soon as possible post op.=> Recommend 7-day hold for spinal procedures. No other medications to hold       Relevant Orders   EKG 12-Lead (Completed)            Follow-Up: Return in about 1 year (around 07/06/2025).  I spent 41 minutes in the care of Samantha Mcbride today including reviewing labs (2 minutes-from [facility] and epic), reviewing studies (3 minutes reviewing prior studies-most recent echo and cardiac catheterization results), face to face time discussing treatment options (18), reviewing records from previous clinic notes and preop evaluation (5 minutes), 6 minutes in preop assessment +12 minutes dictating, and documenting in the encounter.      Signed, Alm MICAEL Clay, MD, MS Alm Clay, M.D., M.S. Interventional Cardiologist  [facility] Pager # [phone]

## 2024-07-06 NOTE — Patient Instructions (Addendum)
 Medication Instructions:    No changes   *If you need a refill on your cardiac medications before your next appointment, please call your pharmacy*   Lab Work: fasting  CMP LIPID HgbA1c    Testing/Procedures:  Not needed  Follow-Up: At [facility], you and your health needs are our priority.  As part of our continuing mission to provide you with exceptional heart care, we have created designated Provider Care Teams.  These Care Teams include your primary Cardiologist (physician) and Advanced Practice Providers (APPs -  Physician Assistants and Nurse Practitioners) who all work together to provide you with the care you need, when you need it.     Your next appointment:   12 month(s)  The format for your next appointment:   In Person  Provider:   Alm Clay, MD

## 2024-07-14 ENCOUNTER — Encounter: Payer: Self-pay | Admitting: Cardiology

## 2024-07-14 DIAGNOSIS — I255 Ischemic cardiomyopathy: Secondary | ICD-10-CM | POA: Insufficient documentation

## 2024-07-14 NOTE — Assessment & Plan Note (Signed)
 Cholesterol levels require monitoring.  I do not have labs since last December.  LDL was still relatively controlled at 65. - Continue Crestor  20 mg daily. - Continue Zetia  10 mg daily. - Order cholesterol levels with blood work.

## 2024-07-14 NOTE — Assessment & Plan Note (Signed)
 Ischemic cardiomyopathy following anterior STEMI.  EF stable at 40 to 45%. NYHA class II symptoms of Tikosyn exertional dyspnea from COPD.  No PND orthopnea trivial edema. -Continue carvedilol  12.5 mg twice daily.  - She is no longer on ARB/ACE inhibitor/ARNI or MRA because of concerns of renal insufficiency.   - Euvolemic, not requiring diuretic.

## 2024-07-14 NOTE — Assessment & Plan Note (Signed)
 In the setting of MI.  No further episodes.

## 2024-07-14 NOTE — Assessment & Plan Note (Signed)
 73 year old this November from her MI with cardiac arrest. Mild ischemic cardiomyopathy with EF of 40 to 45% but no active anginal or CHF symptoms. No further symptoms

## 2024-07-14 NOTE — Assessment & Plan Note (Signed)
 Followed by nephrology.  Most recent creatinine I have is 1.89 from June 2025  Per nephrology, not on a similar ARB or MRA. Will defer to nephrology, but may potentially consider low-dose ARB if pressures were elevated.

## 2024-07-14 NOTE — Assessment & Plan Note (Signed)
 Blood pressure well-controlled on current regimen. - Continue carvedilol  12.5 mg twice daily.

## 2024-07-14 NOTE — Assessment & Plan Note (Signed)
 Coronary artery disease with previous LAD PCI. Mild ischemic cardiomyopathy with EF 40-45%. No symptoms of ischemia. Well-managed on current medications. - Continue Plavix  75 mg daily, hold 5-7 days before surgeries or procedures. - Continue carvedilol  12.5 mg twice daily. - Order blood work including cholesterol levels.

## 2024-07-14 NOTE — Assessment & Plan Note (Signed)
 Revised Cardiac Risk Index: High Risk Surgery: no; relatively low risk Potential epidural injection Active CAD: no; no active chest pain or pressure with rest or exertion. CHF: no; stable NYHA class II symptoms more limited by COPD and deconditioning. Cerebrovascular Disease: no; number report Diabetes: no; On Insulin: no CKD (Cr >~ 2): No; Cr 1.89 as of this summer.  Samantha Mcbride's perioperative risk of a major cardiac event is 0.4% according to the Revised Cardiac Risk Index (RCRI).  Therefore, she is at low risk for perioperative complications.   Her functional capacity is good at 6.27 METs according to the Duke Activity Status Index (DASI). Recommendations: According to ACC/AHA guidelines, no further cardiovascular testing needed.  The patient may proceed to surgery at acceptable risk.   Antiplatelet and/or Anticoagulation Recommendations: Clopidogrel  (Plavix ) can be held for 7 days prior to her surgery and resumed as soon as possible post op.=> Recommend 7-day hold for spinal procedures. No other medications to hold

## 2024-07-21 DIAGNOSIS — E785 Hyperlipidemia, unspecified: Secondary | ICD-10-CM | POA: Diagnosis not present

## 2024-07-21 DIAGNOSIS — R7302 Impaired glucose tolerance (oral): Secondary | ICD-10-CM | POA: Diagnosis not present

## 2024-07-21 DIAGNOSIS — Z9861 Coronary angioplasty status: Secondary | ICD-10-CM | POA: Diagnosis not present

## 2024-07-21 DIAGNOSIS — I2510 Atherosclerotic heart disease of native coronary artery without angina pectoris: Secondary | ICD-10-CM | POA: Diagnosis not present

## 2024-07-21 DIAGNOSIS — I2109 ST elevation (STEMI) myocardial infarction involving other coronary artery of anterior wall: Secondary | ICD-10-CM | POA: Diagnosis not present

## 2024-07-21 LAB — COMPREHENSIVE METABOLIC PANEL WITH GFR
ALT: 6 IU/L (ref 0–32)
AST: 10 IU/L (ref 0–40)
Albumin: 3.9 g/dL (ref 3.8–4.8)
Alkaline Phosphatase: 78 IU/L (ref 49–135)
BUN/Creatinine Ratio: 8 — ABNORMAL LOW (ref 12–28)
BUN: 18 mg/dL (ref 8–27)
Bilirubin Total: 0.3 mg/dL (ref 0.0–1.2)
CO2: 24 mmol/L (ref 20–29)
Calcium: 9 mg/dL (ref 8.7–10.3)
Chloride: 102 mmol/L (ref 96–106)
Creatinine, Ser: 2.14 mg/dL — ABNORMAL HIGH (ref 0.57–1.00)
Globulin, Total: 2.2 g/dL (ref 1.5–4.5)
Glucose: 112 mg/dL — ABNORMAL HIGH (ref 70–99)
Potassium: 3 mmol/L — ABNORMAL LOW (ref 3.5–5.2)
Sodium: 140 mmol/L (ref 134–144)
Total Protein: 6.1 g/dL (ref 6.0–8.5)
eGFR: 24 mL/min/1.73 — ABNORMAL LOW (ref >59)

## 2024-07-21 LAB — HEMOGLOBIN A1C
Est. average glucose Bld gHb Est-mCnc: 94 mg/dL
Hgb A1c MFr Bld: 4.9 % (ref 4.8–5.6)

## 2024-07-21 LAB — LIPID PANEL
Chol/HDL Ratio: 2.8 ratio (ref 0.0–4.4)
Cholesterol, Total: 115 mg/dL (ref 100–199)
HDL: 41 mg/dL (ref >39)
LDL Chol Calc (NIH): 41 mg/dL (ref 0–99)
Triglycerides: 207 mg/dL — ABNORMAL HIGH (ref 0–149)
VLDL Cholesterol Cal: 33 mg/dL (ref 5–40)

## 2024-07-22 ENCOUNTER — Telehealth (HOSPITAL_BASED_OUTPATIENT_CLINIC_OR_DEPARTMENT_OTHER): Payer: Self-pay | Admitting: *Deleted

## 2024-07-22 ENCOUNTER — Ambulatory Visit: Payer: Self-pay | Admitting: Cardiology

## 2024-07-22 NOTE — Telephone Encounter (Signed)
   Pre-operative Risk Assessment    Patient Name: Antara Brecheisen  DOB: 05-26-1951 MRN: [id number]   Date of last office visit: 07/06/24 DR. HARDING Date of next office visit: NONE   Request for Surgical Clearance    Procedure:  L3-4 ESI, RADICULOPATHY   Date of Surgery:  Clearance TBD  STAT PER FORM                              Surgeon:  DR. DAVE [facility] Surgeon's Group or Practice Name:  [facility] Phone number:  [phone] Fax number:  [phone]   Type of Clearance Requested:   - Medical  - Pharmacy:  Hold Clopidogrel  (Plavix ) x 7 DAYS PRIOR AND RESUME THE DAY AFTER PROCEDURE   Type of Anesthesia:  Not Indicated   Additional requests/questions:    Bonney Niels Jest   07/22/2024, 4:52 PM

## 2024-07-23 DIAGNOSIS — M5416 Radiculopathy, lumbar region: Secondary | ICD-10-CM | POA: Diagnosis not present

## 2024-07-23 NOTE — Telephone Encounter (Signed)
     Primary Cardiologist: Alm Clay, MD  Chart reviewed as part of pre-operative protocol coverage. Given past medical history and time since last visit, based on ACC/AHA guidelines, Samantha Mcbride would be at acceptable risk for the planned procedure without further cardiovascular testing.   Her Plavix  may be held for 5 to 7 days prior to her procedure.  Please resume as soon as hemostasis is achieved.  I will route this recommendation to the requesting party via Epic fax function and remove from pre-op pool.  Please call with questions.  Samantha Mcbride. Samantha Raineri NP-C     07/23/2024, 8:15 AM [facility] [address] [place], [place] [address] Office [phone]

## 2024-08-04 ENCOUNTER — Emergency Department (HOSPITAL_BASED_OUTPATIENT_CLINIC_OR_DEPARTMENT_OTHER)

## 2024-08-04 ENCOUNTER — Emergency Department (HOSPITAL_BASED_OUTPATIENT_CLINIC_OR_DEPARTMENT_OTHER): Admission: EM | Admit: 2024-08-04 | Discharge: 2024-08-04 | Disposition: A | Discharge status: Home / Self Care

## 2024-08-04 ENCOUNTER — Other Ambulatory Visit: Payer: Self-pay

## 2024-08-04 DIAGNOSIS — R531 Weakness: Secondary | ICD-10-CM | POA: Diagnosis not present

## 2024-08-04 DIAGNOSIS — Z7901 Long term (current) use of anticoagulants: Secondary | ICD-10-CM | POA: Insufficient documentation

## 2024-08-04 DIAGNOSIS — M5416 Radiculopathy, lumbar region: Secondary | ICD-10-CM | POA: Diagnosis not present

## 2024-08-04 DIAGNOSIS — J449 Chronic obstructive pulmonary disease, unspecified: Secondary | ICD-10-CM | POA: Diagnosis not present

## 2024-08-04 DIAGNOSIS — R29898 Other symptoms and signs involving the musculoskeletal system: Secondary | ICD-10-CM | POA: Diagnosis not present

## 2024-08-04 DIAGNOSIS — M48061 Spinal stenosis, lumbar region without neurogenic claudication: Secondary | ICD-10-CM | POA: Diagnosis not present

## 2024-08-04 DIAGNOSIS — I10 Essential (primary) hypertension: Secondary | ICD-10-CM | POA: Diagnosis not present

## 2024-08-04 DIAGNOSIS — I2510 Atherosclerotic heart disease of native coronary artery without angina pectoris: Secondary | ICD-10-CM | POA: Diagnosis not present

## 2024-08-04 DIAGNOSIS — I700 Atherosclerosis of aorta: Secondary | ICD-10-CM | POA: Diagnosis not present

## 2024-08-04 DIAGNOSIS — M545 Low back pain, unspecified: Secondary | ICD-10-CM | POA: Diagnosis present

## 2024-08-04 DIAGNOSIS — I517 Cardiomegaly: Secondary | ICD-10-CM | POA: Diagnosis not present

## 2024-08-04 DIAGNOSIS — J42 Unspecified chronic bronchitis: Secondary | ICD-10-CM | POA: Diagnosis not present

## 2024-08-04 MED ORDER — TRAMADOL HCL 50 MG PO TABS
50.0000 mg | ORAL_TABLET | Freq: Once | ORAL | Status: AC
  Administered 2024-08-04: 50 mg via ORAL
  Filled 2024-08-04: qty 1

## 2024-08-04 MED ORDER — TRAMADOL HCL 50 MG PO TABS: 50.0000 mg | ORAL_TABLET | Freq: Two times a day (BID) | ORAL | 0 refills | Status: DC | PRN

## 2024-08-04 MED ORDER — LIDOCAINE 5 % EX PTCH: 1.0000 | MEDICATED_PATCH | CUTANEOUS | 0 refills | Status: DC

## 2024-08-04 NOTE — ED Triage Notes (Signed)
 Pt arrived POV with daughter c/o weakness in legs bilat worsening since Friday, after getting injection in the back last Tues. Limited mobility since. Also reports fall on Sat with bruising on L side of chest.

## 2024-08-04 NOTE — Discharge Instructions (Signed)
 Your workup was reassuring please use the lidocaine  patches and your Voltaren gel at home on your back.  You may also take 650 mg of Tylenol  every 6 hours as needed for pain.  If you are still having pain is okay to take the tramadol.  With your kidney function take this a maximum of twice per day.  Please follow-up with your back doctor and go to [facility] with worsening symptoms as we may need to do more advanced imaging of your back.

## 2024-08-04 NOTE — ED Provider Notes (Signed)
 [facility] AT [facility] Provider Note   CSN: [id number] Arrival date & time: 08/04/24  1012     Patient presents with: Extremity Weakness   Samantha Mcbride is a 73 y.o. female.   72 year old female with past medical history of hypertension and hyperlipidemia presenting to the emergency department today with low back pain going down both legs.  This been going on now since Friday.  The patient did have a fall on Saturday.  Reports that her back pain is relatively stable since then.  She is complaining of some mild left anterior chest discomfort and does have a bruise there from when she fell.  She did not hit her head or lose consciousness.  Denies any headaches.  The patient reports that she did have an epidural injection on Tuesday of last week.  She has had multiple epidural injections and never had any issues.  States that the pain started up a few days later.  She denies any bowel or bladder dysfunction and has not had any saddle anesthesia.  She states she normally does not take pain medications at home but has tolerated tramadol in the past.  She denies any fevers.   Extremity Weakness       Prior to Admission medications   Medication Sig Start Date End Date Taking? Authorizing Provider  lidocaine  (LIDODERM ) 5 % Place 1 patch onto the skin daily. Remove & Discard patch within 12 hours or as directed by MD 08/04/24  Yes Ula Prentice SAUNDERS, MD  traMADol (ULTRAM) 50 MG tablet Take 1 tablet (50 mg total) by mouth every 12 (twelve) hours as needed. 08/04/24  Yes Ula Prentice SAUNDERS, MD  acetaminophen  (TYLENOL ) 500 MG tablet Take 500 mg by mouth every 6 (six) hours as needed for moderate pain or headache.    [provider]  Calcium  Citrate-Vitamin D  (CALCIUM  + D PO) Take 1 tablet by mouth daily.    [provider]  carvedilol  (COREG ) 12.5 MG tablet Take 12.5 mg by mouth 2 (two) times daily. 04/01/19   [provider]  clopidogrel  (PLAVIX )  75 MG tablet TAKE 1 TABLET BY MOUTH EVERY DAY 07/01/24   Anner Alm ORN, MD  diclofenac sodium (VOLTAREN) 1 % GEL Apply 2 g topically 4 (four) times daily as needed (pain).     [provider]  DM-APAP-CPM (CORICIDIN HBP PO) Take 1 tablet by mouth daily as needed (allergies).    [provider]  ezetimibe  (ZETIA ) 10 MG tablet TAKE 1 TABLET BY MOUTH EVERY DAY 07/07/24   Anner Alm ORN, MD  gabapentin  (NEURONTIN ) 100 MG capsule TAKE 3 CAPSULES BY MOUTH AT BEDTIME Patient not taking: Reported on 07/06/2024 06/03/23   Hyatt, Max T, DPM  hydroxychloroquine  (PLAQUENIL ) 200 MG tablet Take 1 tablet (200 mg total) by mouth daily. 06/15/20   Gudena, Vinay, MD  leflunomide  (ARAVA ) 20 MG tablet Take 20 mg by mouth daily. 01/09/21   [provider]  Melatonin 5 MG CAPS Take 5 mg by mouth at bedtime.    [provider]  nitroGLYCERIN  (NITROSTAT ) 0.4 MG SL tablet PLACE 1 TABLET UNDER THE TONGUE EVERY 5 MINUTES AS NEEDED FOR CHEST PAIN. 08/01/15   Anner Alm ORN, MD  rosuvastatin  (CRESTOR ) 20 MG tablet TAKE 1 TABLET BY MOUTH EVERY DAY 10/15/23   Anner Alm ORN, MD  tiZANidine (ZANAFLEX) 2 MG tablet Take 2 mg by mouth at bedtime as needed.    [provider]  traZODone  (DESYREL ) 100  MG tablet Take 100 mg by mouth at bedtime.     [provider]    Allergies: Patient has no known allergies.    Review of Systems  Musculoskeletal:  Positive for arthralgias, back pain and extremity weakness.  All other systems reviewed and are negative.   Updated Vital Signs BP 116/68 (BP Location: Left Arm)   Pulse 77   Temp 98.4 F (36.9 C) (Oral)   Resp 19   Ht 5' 1 (1.549 m)   Wt 79.4 kg   SpO2 93%   BMI 33.07 kg/m   Physical Exam Vitals and nursing note reviewed.   Gen: NAD Eyes: PERRL, EOMI HEENT: no oropharyngeal swelling Neck: trachea midline Resp: clear to auscultation bilaterally Card: RRR, no murmurs, rubs, or gallops Abd: nontender,  nondistended Extremities: no calf tenderness, no edema MSK: The patient is not really have any midline lumbar spinal tenderness noted and there is no overlying erythema noted Vascular: 2+ radial pulses bilaterally, 2+ DP pulses bilaterally Neuro: The patient has equal strength and sensation throughout the bilateral lower extremities with normal patellar and Achilles reflexes bilaterally Skin: no rashes Psyc: acting appropriately   (all labs ordered are listed, but only abnormal results are displayed) Labs Reviewed - No data to display  EKG: None  Radiology: DG Chest Portable 1 View Result Date: 08/04/2024 EXAM: 1 VIEW(S) XRAY OF THE CHEST 08/04/2024 11:27:16 AM COMPARISON: Chest radiograph 10/17/2022. CLINICAL HISTORY: Fall. Triage note: Pt arrived POV with daughter c/o weakness in legs bilat worsening since Friday, after getting injection in the back last Tues. Limited mobility since. Also reports fall on Sat with bruising on L side of chest. FINDINGS: LINES, TUBES AND DEVICES: Surgical clips in right axilla. Coronary artery stent noted. LUNGS AND PLEURA: Lower lung predominant interstitial thickening, likely related to prior smoking / chronic bronchitis. No focal pulmonary opacity. No pulmonary edema. No pleural effusion. No pneumothorax. HEART AND MEDIASTINUM: Cardiomegaly. Calcified aorta, consistent with aortic atherosclerosis (ICD10-I70.0). BONES: Osteopenia. No acute osseous abnormality. JOINTS: Intraarticular loose body within the left glenohumeral joint measuring 1.7 cm. IMPRESSION: 1. No acute cardiopulmonary pathology related to the fall. 2. Cardiomegaly and diffuse interstitial thickening, likely related to prior smoking / chronic bronchitis. Electronically signed by: Rockey Kilts MD 08/04/2024 12:36 PM EDT RP Workstation: [id number]   CT Lumbar Spine Wo Contrast Result Date: 08/04/2024 EXAM: CT OF THE LUMBAR SPINE WITHOUT CONTRAST 08/04/2024 11:23:19 AM TECHNIQUE: CT of the lumbar  spine was performed without the administration of intravenous contrast. Multiplanar reformatted images are provided for review. Automated exposure control, iterative reconstruction, and/or weight based adjustment of the mA/kV was utilized to reduce the radiation dose to as low as reasonably achievable. COMPARISON: MRI lumbar spine 11/02/2021. CLINICAL HISTORY: Low back pain, increased fracture risk, recent epidural injection, fall, and bilateral leg weakness. FINDINGS: BONES AND ALIGNMENT: 5 lumbar vertebrae. Normal vertebral body heights. Trace anterolisthesis of L4 and L5 which appears degenerative and facet mediated. No acute fracture or suspicious bone lesion. DEGENERATIVE CHANGES: Preserved disc heights. Multilevel facet arthrosis which is particularly severe bilaterally at L4-L5 and L5-S1. Moderate to severe spinal stenosis and moderate bilateral neural foraminal stenosis at L4-L5 due to bulging uncovered disc, mildly prominent epidural fat, and posterior element hypertrophy. SOFT TISSUES: Abdominal aortic atherosclerosis without aneurysm. No gross epidural hematoma or paraspinal fluid collection. IMPRESSION: 1. No acute fracture. 2. Moderate to severe spinal stenosis and moderate neural foraminal stenosis at L4-5. 3. Severe facet arthrosis at L4-5 and L5-S1. Electronically signed by:  Dasie Hamburg MD 08/04/2024 11:40 AM EDT RP Workstation: [id number]     Procedures   Medications Ordered in the ED  traMADol (ULTRAM) tablet 50 mg (50 mg Oral Given 08/04/24 1106)                                    Medical Decision Making 73 year old female with past medical history of coronary artery disease and RA presenting to the emergency department today with lower extremity weakness and pain.  I will further evaluate the patient here with x-rays of her chest and a CT scan lumbar spine to eval for acute traumatic injuries from her fall.  She does not really have any red flag symptoms for cauda equina syndrome or  cord compressing lesion at this time but she is on Plavix  so that would be a potential risk factor for epidural hematoma and CT scan could evaluate for large hematoma.  I will treat the patient's pain here with tramadol.  I will reevaluate.  If her pain is relatively well-controlled and symptoms improved with pain management she can hopefully be discharged.  The patient's chest x-ray does show some findings consistent with COPD but no obvious rib fractures.  CT scan of her lumbar spine did not show any acute abnormalities but did show a lot of degenerative changes.  With ambulation here the patient is doing much better with pain control.  I think in light of this that the patient is stable for discharge with outpatient follow-up.  I have encouraged her to return to the ER for fevers or red flag symptoms for cauda equina syndrome.  She will be discharged with return precautions.  Amount and/or Complexity of Data Reviewed Radiology: ordered.  Risk Prescription drug management.        Final diagnoses:  Lumbar radiculopathy    ED Discharge Orders          Ordered    lidocaine  (LIDODERM ) 5 %  Every 24 hours        08/04/24 1323    traMADol (ULTRAM) 50 MG tablet  Every 12 hours PRN        08/04/24 1323               Ula Prentice SAUNDERS, MD 08/04/24 1325

## 2024-08-05 DIAGNOSIS — M5416 Radiculopathy, lumbar region: Secondary | ICD-10-CM | POA: Diagnosis not present

## 2024-08-27 ENCOUNTER — Other Ambulatory Visit: Payer: Self-pay | Admitting: Podiatry

## 2024-08-29 ENCOUNTER — Inpatient Hospital Stay (HOSPITAL_COMMUNITY)
Admission: EM | Admit: 2024-08-29 | Discharge: 2024-09-04 | DRG: 516 | Disposition: A | Discharge status: Skilled Nursing Facility | Attending: Neurological Surgery | Admitting: Neurological Surgery

## 2024-08-29 ENCOUNTER — Inpatient Hospital Stay (HOSPITAL_COMMUNITY)

## 2024-08-29 ENCOUNTER — Encounter (HOSPITAL_COMMUNITY): Payer: Self-pay

## 2024-08-29 ENCOUNTER — Other Ambulatory Visit: Payer: Self-pay

## 2024-08-29 DIAGNOSIS — Z853 Personal history of malignant neoplasm of breast: Secondary | ICD-10-CM | POA: Diagnosis not present

## 2024-08-29 DIAGNOSIS — D72829 Elevated white blood cell count, unspecified: Secondary | ICD-10-CM | POA: Diagnosis present

## 2024-08-29 DIAGNOSIS — E8809 Other disorders of plasma-protein metabolism, not elsewhere classified: Secondary | ICD-10-CM | POA: Diagnosis present

## 2024-08-29 DIAGNOSIS — N184 Chronic kidney disease, stage 4 (severe): Secondary | ICD-10-CM | POA: Diagnosis present

## 2024-08-29 DIAGNOSIS — I252 Old myocardial infarction: Secondary | ICD-10-CM

## 2024-08-29 DIAGNOSIS — Z7902 Long term (current) use of antithrombotics/antiplatelets: Secondary | ICD-10-CM

## 2024-08-29 DIAGNOSIS — R29898 Other symptoms and signs involving the musculoskeletal system: Secondary | ICD-10-CM

## 2024-08-29 DIAGNOSIS — G2581 Restless legs syndrome: Secondary | ICD-10-CM | POA: Diagnosis present

## 2024-08-29 DIAGNOSIS — Z87891 Personal history of nicotine dependence: Secondary | ICD-10-CM

## 2024-08-29 DIAGNOSIS — E785 Hyperlipidemia, unspecified: Secondary | ICD-10-CM | POA: Diagnosis present

## 2024-08-29 DIAGNOSIS — M4726 Other spondylosis with radiculopathy, lumbar region: Secondary | ICD-10-CM | POA: Diagnosis present

## 2024-08-29 DIAGNOSIS — J449 Chronic obstructive pulmonary disease, unspecified: Secondary | ICD-10-CM | POA: Diagnosis present

## 2024-08-29 DIAGNOSIS — M009 Pyogenic arthritis, unspecified: Secondary | ICD-10-CM | POA: Diagnosis present

## 2024-08-29 DIAGNOSIS — I2510 Atherosclerotic heart disease of native coronary artery without angina pectoris: Secondary | ICD-10-CM | POA: Diagnosis present

## 2024-08-29 DIAGNOSIS — E876 Hypokalemia: Secondary | ICD-10-CM | POA: Diagnosis present

## 2024-08-29 DIAGNOSIS — I129 Hypertensive chronic kidney disease with stage 1 through stage 4 chronic kidney disease, or unspecified chronic kidney disease: Secondary | ICD-10-CM | POA: Diagnosis present

## 2024-08-29 DIAGNOSIS — E872 Acidosis, unspecified: Secondary | ICD-10-CM | POA: Diagnosis present

## 2024-08-29 DIAGNOSIS — M4316 Spondylolisthesis, lumbar region: Secondary | ICD-10-CM | POA: Diagnosis present

## 2024-08-29 DIAGNOSIS — R509 Fever, unspecified: Secondary | ICD-10-CM | POA: Diagnosis not present

## 2024-08-29 DIAGNOSIS — M48061 Spinal stenosis, lumbar region without neurogenic claudication: Principal | ICD-10-CM | POA: Diagnosis present

## 2024-08-29 DIAGNOSIS — M069 Rheumatoid arthritis, unspecified: Secondary | ICD-10-CM | POA: Diagnosis present

## 2024-08-29 DIAGNOSIS — I10 Essential (primary) hypertension: Secondary | ICD-10-CM | POA: Diagnosis present

## 2024-08-29 DIAGNOSIS — N390 Urinary tract infection, site not specified: Secondary | ICD-10-CM | POA: Diagnosis present

## 2024-08-29 DIAGNOSIS — Z803 Family history of malignant neoplasm of breast: Secondary | ICD-10-CM

## 2024-08-29 DIAGNOSIS — M21372 Foot drop, left foot: Secondary | ICD-10-CM | POA: Diagnosis present

## 2024-08-29 DIAGNOSIS — Z751 Person awaiting admission to adequate facility elsewhere: Secondary | ICD-10-CM

## 2024-08-29 DIAGNOSIS — I2583 Coronary atherosclerosis due to lipid rich plaque: Secondary | ICD-10-CM

## 2024-08-29 DIAGNOSIS — Z8249 Family history of ischemic heart disease and other diseases of the circulatory system: Secondary | ICD-10-CM | POA: Diagnosis not present

## 2024-08-29 DIAGNOSIS — Z8674 Personal history of sudden cardiac arrest: Secondary | ICD-10-CM | POA: Diagnosis not present

## 2024-08-29 DIAGNOSIS — E66811 Obesity, class 1: Secondary | ICD-10-CM | POA: Diagnosis present

## 2024-08-29 DIAGNOSIS — D631 Anemia in chronic kidney disease: Secondary | ICD-10-CM | POA: Diagnosis present

## 2024-08-29 DIAGNOSIS — Z923 Personal history of irradiation: Secondary | ICD-10-CM | POA: Diagnosis not present

## 2024-08-29 DIAGNOSIS — R159 Full incontinence of feces: Secondary | ICD-10-CM | POA: Diagnosis present

## 2024-08-29 DIAGNOSIS — Z79899 Other long term (current) drug therapy: Secondary | ICD-10-CM | POA: Diagnosis not present

## 2024-08-29 DIAGNOSIS — Z808 Family history of malignant neoplasm of other organs or systems: Secondary | ICD-10-CM

## 2024-08-29 DIAGNOSIS — J431 Panlobular emphysema: Secondary | ICD-10-CM

## 2024-08-29 DIAGNOSIS — Z955 Presence of coronary angioplasty implant and graft: Secondary | ICD-10-CM

## 2024-08-29 DIAGNOSIS — M5442 Lumbago with sciatica, left side: Principal | ICD-10-CM | POA: Diagnosis present

## 2024-08-29 DIAGNOSIS — Z1152 Encounter for screening for COVID-19: Secondary | ICD-10-CM | POA: Diagnosis not present

## 2024-08-29 DIAGNOSIS — Z6836 Body mass index (BMI) 36.0-36.9, adult: Secondary | ICD-10-CM

## 2024-08-29 DIAGNOSIS — Z9889 Other specified postprocedural states: Secondary | ICD-10-CM

## 2024-08-29 DIAGNOSIS — J439 Emphysema, unspecified: Secondary | ICD-10-CM | POA: Diagnosis present

## 2024-08-29 LAB — CBC WITH DIFFERENTIAL/PLATELET
Abs Immature Granulocytes: 0.05 K/uL (ref 0.00–0.07)
Basophils Absolute: 0.1 K/uL (ref 0.0–0.1)
Basophils Relative: 0 %
Eosinophils Absolute: 0 K/uL (ref 0.0–0.5)
Eosinophils Relative: 0 %
HCT: 37.5 % (ref 36.0–46.0)
Hemoglobin: 11.9 g/dL — ABNORMAL LOW (ref 12.0–15.0)
Immature Granulocytes: 0 %
Lymphocytes Relative: 6 %
Lymphs Abs: 1 K/uL (ref 0.7–4.0)
MCH: 29.9 pg (ref 26.0–34.0)
MCHC: 31.7 g/dL (ref 30.0–36.0)
MCV: 94.2 fL (ref 80.0–100.0)
Monocytes Absolute: 0.9 K/uL (ref 0.1–1.0)
Monocytes Relative: 6 %
Neutro Abs: 13.3 K/uL — ABNORMAL HIGH (ref 1.7–7.7)
Neutrophils Relative %: 88 %
Platelets: 180 K/uL (ref 150–400)
RBC: 3.98 MIL/uL (ref 3.87–5.11)
RDW: 13.4 % (ref 11.5–15.5)
WBC: 15.3 K/uL — ABNORMAL HIGH (ref 4.0–10.5)
nRBC: 0 % (ref 0.0–0.2)

## 2024-08-29 LAB — I-STAT CHEM 8, ED
BUN: 22 mg/dL (ref 8–23)
Calcium, Ion: 0.92 mmol/L — ABNORMAL LOW (ref 1.15–1.40)
Chloride: 109 mmol/L (ref 98–111)
Creatinine, Ser: 2.1 mg/dL — ABNORMAL HIGH (ref 0.44–1.00)
Glucose, Bld: 103 mg/dL — ABNORMAL HIGH (ref 70–99)
HCT: 34 % — ABNORMAL LOW (ref 36.0–46.0)
Hemoglobin: 11.6 g/dL — ABNORMAL LOW (ref 12.0–15.0)
Potassium: 3.1 mmol/L — ABNORMAL LOW (ref 3.5–5.1)
Sodium: 138 mmol/L (ref 135–145)
TCO2: 23 mmol/L (ref 22–32)

## 2024-08-29 LAB — URINALYSIS, ROUTINE W REFLEX MICROSCOPIC
Bilirubin Urine: NEGATIVE
Glucose, UA: NEGATIVE mg/dL
Ketones, ur: NEGATIVE mg/dL
Nitrite: POSITIVE — AB
Protein, ur: 30 mg/dL — AB
Specific Gravity, Urine: 1.011 (ref 1.005–1.030)
WBC, UA: >50 WBC/hpf (ref 0–5)
pH: 5 (ref 5.0–8.0)

## 2024-08-29 LAB — COMPREHENSIVE METABOLIC PANEL WITH GFR
ALT: 10 U/L (ref 0–44)
AST: 15 U/L (ref 15–41)
Albumin: 3.2 g/dL — ABNORMAL LOW (ref 3.5–5.0)
Alkaline Phosphatase: 56 U/L (ref 38–126)
Anion gap: 13 (ref 5–15)
BUN: 20 mg/dL (ref 8–23)
CO2: 23 mmol/L (ref 22–32)
Calcium: 9.2 mg/dL (ref 8.9–10.3)
Chloride: 105 mmol/L (ref 98–111)
Creatinine, Ser: 1.98 mg/dL — ABNORMAL HIGH (ref 0.44–1.00)
GFR, Estimated: 26 mL/min — ABNORMAL LOW (ref >60)
Glucose, Bld: 106 mg/dL — ABNORMAL HIGH (ref 70–99)
Potassium: 3.2 mmol/L — ABNORMAL LOW (ref 3.5–5.1)
Sodium: 141 mmol/L (ref 135–145)
Total Bilirubin: 0.3 mg/dL (ref 0.0–1.2)
Total Protein: 6.2 g/dL — ABNORMAL LOW (ref 6.5–8.1)

## 2024-08-29 LAB — SEDIMENTATION RATE: Sed Rate: 47 mm/h — ABNORMAL HIGH (ref 0–22)

## 2024-08-29 LAB — C-REACTIVE PROTEIN: CRP: 3.5 mg/dL — ABNORMAL HIGH (ref <1.0)

## 2024-08-29 LAB — RESP PANEL BY RT-PCR (RSV, FLU A&B, COVID)  RVPGX2
Influenza A by PCR: NEGATIVE
Influenza B by PCR: NEGATIVE
Resp Syncytial Virus by PCR: NEGATIVE
SARS Coronavirus 2 by RT PCR: NEGATIVE

## 2024-08-29 MED ORDER — DM-APAP-CPM 10-325-2 MG PO TABS: ORAL_TABLET | Freq: Every day | ORAL | Status: DC | PRN

## 2024-08-29 MED ORDER — TRAZODONE HCL 100 MG PO TABS
100.0000 mg | ORAL_TABLET | Freq: Every day | ORAL | Status: DC
  Administered 2024-08-29 – 2024-09-04 (×7): 100 mg via ORAL
  Filled 2024-08-29 (×7): qty 1

## 2024-08-29 MED ORDER — SODIUM CHLORIDE 0.9% FLUSH: 3.0000 mL | INTRAVENOUS | Status: DC | PRN

## 2024-08-29 MED ORDER — ALBUTEROL SULFATE (2.5 MG/3ML) 0.083% IN NEBU: 2.5000 mg | INHALATION_SOLUTION | RESPIRATORY_TRACT | Status: DC | PRN

## 2024-08-29 MED ORDER — MELATONIN 5 MG PO TABS
5.0000 mg | ORAL_TABLET | Freq: Every evening | ORAL | Status: DC
Start: 2024-08-29 — End: 2024-09-05
  Administered 2024-08-29 – 2024-09-04 (×7): 5 mg via ORAL
  Filled 2024-08-29 (×7): qty 1

## 2024-08-29 MED ORDER — ONDANSETRON HCL 4 MG/2ML IJ SOLN
4.0000 mg | Freq: Once | INTRAMUSCULAR | Status: AC
  Administered 2024-08-29: 4 mg via INTRAVENOUS
  Filled 2024-08-29: qty 2

## 2024-08-29 MED ORDER — ACETAMINOPHEN 500 MG PO TABS
1000.0000 mg | ORAL_TABLET | Freq: Four times a day (QID) | ORAL | Status: DC
  Administered 2024-08-29: 1000 mg via ORAL
  Filled 2024-08-29: qty 2

## 2024-08-29 MED ORDER — ONDANSETRON HCL 4 MG PO TABS: 4.0000 mg | ORAL_TABLET | Freq: Four times a day (QID) | ORAL | Status: DC | PRN

## 2024-08-29 MED ORDER — SODIUM CHLORIDE 0.9% FLUSH
3.0000 mL | Freq: Two times a day (BID) | INTRAVENOUS | Status: DC
  Administered 2024-08-29: 3 mL via INTRAVENOUS

## 2024-08-29 MED ORDER — LEFLUNOMIDE 10 MG PO TABS
20.0000 mg | ORAL_TABLET | Freq: Every day | ORAL | Status: DC
  Administered 2024-08-29 – 2024-09-04 (×6): 20 mg via ORAL
  Filled 2024-08-29 (×7): qty 2

## 2024-08-29 MED ORDER — HYDROMORPHONE HCL 1 MG/ML IJ SOLN
0.5000 mg | Freq: Once | INTRAMUSCULAR | Status: AC
  Administered 2024-08-29: 0.5 mg via INTRAVENOUS
  Filled 2024-08-29: qty 1

## 2024-08-29 MED ORDER — TIZANIDINE HCL 2 MG PO TABS
2.0000 mg | ORAL_TABLET | Freq: Three times a day (TID) | ORAL | Status: DC | PRN
  Administered 2024-09-04: 2 mg via ORAL
  Filled 2024-08-29: qty 1

## 2024-08-29 MED ORDER — OXYCODONE HCL 5 MG PO TABS: 5.0000 mg | ORAL_TABLET | ORAL | Status: DC | PRN

## 2024-08-29 MED ORDER — CARVEDILOL 12.5 MG PO TABS
12.5000 mg | ORAL_TABLET | Freq: Two times a day (BID) | ORAL | Status: DC
  Administered 2024-08-30 – 2024-09-04 (×12): 12.5 mg via ORAL
  Filled 2024-08-29 (×12): qty 1

## 2024-08-29 MED ORDER — SODIUM CHLORIDE 0.9 % IV SOLN: 250.0000 mL | INTRAVENOUS | Status: DC

## 2024-08-29 MED ORDER — ACETAMINOPHEN 500 MG PO TABS
1000.0000 mg | ORAL_TABLET | ORAL | Status: AC
  Administered 2024-08-29: 1000 mg via ORAL
  Filled 2024-08-29: qty 2

## 2024-08-29 MED ORDER — OXYCODONE HCL 5 MG PO TABS
5.0000 mg | ORAL_TABLET | ORAL | Status: AC
  Administered 2024-08-29: 5 mg via ORAL
  Filled 2024-08-29: qty 1

## 2024-08-29 MED ORDER — ONDANSETRON HCL 4 MG/2ML IJ SOLN: 4.0000 mg | Freq: Four times a day (QID) | INTRAMUSCULAR | Status: DC | PRN

## 2024-08-29 MED ORDER — EZETIMIBE 10 MG PO TABS
10.0000 mg | ORAL_TABLET | Freq: Every day | ORAL | Status: DC
  Administered 2024-08-31 – 2024-09-04 (×5): 10 mg via ORAL
  Filled 2024-08-29 (×5): qty 1

## 2024-08-29 MED ORDER — DEXAMETHASONE SOD PHOSPHATE PF 10 MG/ML IJ SOLN
10.0000 mg | Freq: Once | INTRAMUSCULAR | Status: AC
  Administered 2024-08-29: 10 mg via INTRAVENOUS

## 2024-08-29 MED ORDER — ADULT MULTIVITAMIN W/MINERALS CH
1.0000 | ORAL_TABLET | Freq: Every day | ORAL | Status: DC
  Administered 2024-08-29 – 2024-09-04 (×6): 1 via ORAL
  Filled 2024-08-29 (×6): qty 1

## 2024-08-29 MED ORDER — MORPHINE SULFATE (PF) 2 MG/ML IV SOLN: 2.0000 mg | INTRAVENOUS | Status: DC | PRN

## 2024-08-29 MED ORDER — LIDOCAINE 5 % EX PTCH
1.0000 | MEDICATED_PATCH | CUTANEOUS | Status: DC
  Administered 2024-08-29: 1 via TRANSDERMAL
  Filled 2024-08-29: qty 1

## 2024-08-29 MED ORDER — POTASSIUM CHLORIDE IN NACL 20-0.9 MEQ/L-% IV SOLN
INTRAVENOUS | Status: DC
  Filled 2024-08-29: qty 1000

## 2024-08-29 MED ORDER — POTASSIUM CHLORIDE CRYS ER 20 MEQ PO TBCR
40.0000 meq | EXTENDED_RELEASE_TABLET | Freq: Once | ORAL | Status: AC
  Administered 2024-08-29: 40 meq via ORAL
  Filled 2024-08-29: qty 2

## 2024-08-29 MED ORDER — SENNA 8.6 MG PO TABS
1.0000 | ORAL_TABLET | Freq: Two times a day (BID) | ORAL | Status: DC
  Filled 2024-08-29: qty 1

## 2024-08-29 MED ORDER — GABAPENTIN 300 MG PO CAPS
300.0000 mg | ORAL_CAPSULE | Freq: Every evening | ORAL | Status: DC
  Administered 2024-08-29 – 2024-09-04 (×7): 300 mg via ORAL
  Filled 2024-08-29 (×7): qty 1

## 2024-08-29 MED ORDER — ROSUVASTATIN CALCIUM 20 MG PO TABS
20.0000 mg | ORAL_TABLET | Freq: Every day | ORAL | Status: DC
  Administered 2024-08-29 – 2024-09-04 (×6): 20 mg via ORAL
  Filled 2024-08-29 (×7): qty 1

## 2024-08-29 MED ORDER — HYDROXYCHLOROQUINE SULFATE 200 MG PO TABS
200.0000 mg | ORAL_TABLET | Freq: Every day | ORAL | Status: DC
  Administered 2024-08-29 – 2024-09-04 (×6): 200 mg via ORAL
  Filled 2024-08-29 (×6): qty 1

## 2024-08-29 NOTE — H&P (Signed)
 Subjective: Patient is a 73 y.o. female who complains of back pain with left leg pain and left foot drop with difficulty with gait. Onset of symptoms was a few months ago, rapidly worsening since that time.  She has been seeing Dr.  Eichman and received an injection in late October.  She had trouble walking after that they did an MRI of her lumbar spine without contrast.  This showed severe stenosis with spondylolisthesis at L4-5.  Over the last 2 days pain has become much worse and she has much more trouble getting up and mobilizing.  She has had some difficulty with bowel function but not bladder function and no saddle anesthesia.  The pain is rated severe, and is located at the across the lower back radiates to the left leg.  The pain is described as aching and occurs all day.  Symptoms are exacerbated by exercise and standing. The patient has tried injection therapy and medical therapy with muscle relaxants and tramadol.  Creatinine is 2.1 today.  Sounds like she may have a low-grade fever in the ER.[name] MRI showed a grade 1 anterolisthesis at L4-5 with severe spinal stenosis.  She has a history of rheumatoid arthritis.  She has a history of coronary artery disease and had stenting 13 years ago and takes Plavix .  She last took Plavix  yesterday.  Past Medical History:  Diagnosis Date   Anemia    iron deficiency anemia    CAD S/P percutaneous coronary angioplasty 08/2011   Promus DES - mid LAD 3.5 mm x 24 mm (3.72 mm)    Cancer (HCC)    Chronic kidney disease    stage 4 kidney disease per pt dx 02/2019   Complication of anesthesia    Dyslipidemia, goal LDL below 70     on statin, close to goal   Family history of breast cancer    Family history of skin cancer    Former moderate cigarette smoker (10-19 per day)    Glucose intolerance (impaired glucose tolerance)    H/O thyroid  nodule    Benign   History of ST elevation myocardial infarction (STEMI) of anterior wall 08/2011   With cardiac arrest,  100% mobility occlusion. --> Promus DES =>  EF 40 to 45% (similar to 2012) stable wall motion normality (severe HK of midapical anterior apical wall-consistent with prior infarct).  GRII DD.  Normal valves. => Stable compared to 08/30/2011.  EF was still 40 to 45%.   Hypertension    Personal history of radiation therapy    PONV (postoperative nausea and vomiting)    no issues with surgery on 05-11-2019   Rheumatoid arthritis (HCC)    managed on humira    SOB (shortness of breath) on exertion    reports its been that way since my heart attack  repots no recurrence of MI sx since that time     Past Surgical History:  Procedure Laterality Date   ABDOMINAL AORTAGRAM N/A 08/27/2011   Procedure: ABDOMINAL EZELLA;  Surgeon: Debby DELENA Sor, MD;  Location: [facility] CATH LAB;  Service: Cardiovascular;  Laterality: N/A;   ANKLE SURGERY Right 1995   per pt ankle surgery here at [facility]    APPENDECTOMY     BREAST EXCISIONAL BIOPSY Left    BREAST LUMPECTOMY Right 07/2020   BREAST LUMPECTOMY WITH RADIOACTIVE SEED AND SENTINEL LYMPH NODE BIOPSY Right 08/05/2020   Procedure: RIGHT BREAST LUMPECTOMY WITH RADIOACTIVE SEED AND SENTINEL LYMPH NODE BIOPSY;  Surgeon: Curvin Deward MOULD, MD;  Location: [facility] OR;  Service: General;  Laterality: Right;   CYSTOSCOPY WITH RETROGRADE PYELOGRAM, URETEROSCOPY AND STENT PLACEMENT Bilateral 05/11/2019   Procedure: CYSTOSCOPY WITH RETROGRADE PYELOGRAM,  AND STENT PLACEMENT;  Surgeon: Carolee Sherwood JONETTA DOUGLAS, MD;  Location: [facility] ORS;  Service: Urology;  Laterality: Bilateral;   CYSTOSCOPY/URETEROSCOPY/HOLMIUM LASER/STENT PLACEMENT Bilateral 05/25/2019   Procedure: CYSTOSCOPY BILATERAL URETEROSCOPY/HOLMIUM LASER/STENT PLACEMENT;  Surgeon: Carolee Sherwood JONETTA DOUGLAS, MD;  Location: [facility] ORS;  Service: Urology;  Laterality: Bilateral;   INTRAMEDULLARY (IM) NAIL INTERTROCHANTERIC Left 10/18/2022   Procedure: INTRAMEDULLARY NAILING OF LEFT FEMUR;  Surgeon: Celena Sharper, MD;  Location: [facility] OR;  Service:  Orthopedics;  Laterality: Left;   LEFT HEART CATHETERIZATION WITH CORONARY ANGIOGRAM N/A 08/27/2011   Procedure: LEFT HEART CATHETERIZATION WITH CORONARY ANGIOGRAM;  Surgeon: Debby DELENA Sor, MD;  Location: [facility] CATH LAB;  Service: Cardiovascular;;For anterior STEMI/cardiac arrest -- 100% mid LAD occlusion   PERCUTANEOUS CORONARY STENT INTERVENTION (PCI-S) N/A 08/27/2011   Procedure: PERCUTANEOUS CORONARY STENT INTERVENTION (PCI-S);  Surgeon: Debby DELENA Sor, MD;  Location: [facility] CATH LAB;  Service: Cardiovascular;;  mid LAD PCI --> Promus Element DES 3.5 mm at 24 mm (3.72 mm)   TRANSTHORACIC ECHOCARDIOGRAM  08/2011   EF 40-45%, moderate at K. of mid and distal inferior septum and anterior apical myocardium. Grade 1 diastolic function. -- Followup echocardiogram to reassess his EF was denied by insurance company   TRANSTHORACIC ECHOCARDIOGRAM  07/20/2020    EF 40 to 45% (similar to 2012) stable wall motion normality (severe HK of midapical anterior apical wall-consistent with prior infarct).  GRII DD.  Normal valves. => Stable compared to 08/30/2011.  EF was still 40 to 45%.    No Known Allergies  Social History   Tobacco Use   Smoking status: Former    Current packs/day: 0.00    Average packs/day: 1 pack/day for 40.0 years (40.0 ttl pk-yrs)    Types: Cigarettes    Start date: 08/27/1971    Quit date: 08/27/2011    Years since quitting: 13.0   Smokeless tobacco: Never  Substance Use Topics   Alcohol use: No    Family History  Problem Relation Age of Onset   Hypertension Mother    Breast cancer Mother 23       early 58's   Heart failure Father    Heart attack Paternal Grandfather    Breast cancer Sister 63   Skin cancer Sister 21       face   Prior to Admission medications   Medication Sig Start Date End Date Taking? Authorizing Provider  acetaminophen  (TYLENOL ) 500 MG tablet Take 500 mg by mouth every 6 (six) hours as needed for moderate pain or headache.   Yes [provider]   Calcium  Citrate-Vitamin D  (CALCIUM  + D PO) Take 1 tablet by mouth daily.   Yes [provider]  carvedilol  (COREG ) 12.5 MG tablet Take 12.5 mg by mouth 2 (two) times daily. 04/01/19  Yes [provider]  clopidogrel  (PLAVIX ) 75 MG tablet TAKE 1 TABLET BY MOUTH EVERY DAY 07/01/24  Yes Anner Alm ORN, MD  diclofenac sodium (VOLTAREN) 1 % GEL Apply 2 g topically 4 (four) times daily as needed (pain).    Yes [provider]  DM-APAP-CPM (CORICIDIN HBP PO) Take 1 tablet by mouth daily as needed (allergies).   Yes [provider]  ezetimibe  (ZETIA ) 10 MG tablet TAKE 1 TABLET BY MOUTH EVERY DAY 07/07/24  Yes Anner Alm ORN, MD  gabapentin  (NEURONTIN ) 100  MG capsule TAKE 3 CAPSULES BY MOUTH AT BEDTIME Patient taking differently: Take 300 mg by mouth every evening. 06/03/23  Yes Hyatt, Max T, DPM  hydroxychloroquine  (PLAQUENIL ) 200 MG tablet Take 1 tablet (200 mg total) by mouth daily. 06/15/20  Yes Gudena, Vinay, MD  leflunomide  (ARAVA ) 20 MG tablet Take 20 mg by mouth daily. 01/09/21  Yes [provider]  lidocaine  (LIDODERM ) 5 % Place 1 patch onto the skin daily. Remove & Discard patch within 12 hours or as directed by MD 08/04/24  Yes Ula Prentice SAUNDERS, MD  Melatonin 5 MG CAPS Take 5 mg by mouth every evening.   Yes [provider]  rosuvastatin  (CRESTOR ) 20 MG tablet TAKE 1 TABLET BY MOUTH EVERY DAY 10/15/23  Yes Anner Alm ORN, MD  tiZANidine (ZANAFLEX) 2 MG tablet Take 2 mg by mouth at bedtime as needed.   Yes [provider]  traMADol (ULTRAM) 50 MG tablet Take 1 tablet (50 mg total) by mouth every 12 (twelve) hours as needed. Patient taking differently: Take 25 mg by mouth every 12 (twelve) hours as needed for moderate pain (pain score 4-6). 08/04/24  Yes Ula Prentice SAUNDERS, MD  traZODone  (DESYREL ) 100 MG tablet Take 100 mg by mouth at bedtime.    Yes [provider]  nitroGLYCERIN  (NITROSTAT ) 0.4 MG SL tablet PLACE 1 TABLET UNDER THE  TONGUE EVERY 5 MINUTES AS NEEDED FOR CHEST PAIN. Patient not taking: Reported on 08/29/2024 08/01/15   Anner Alm ORN, MD     Review of Systems  Positive ROS: As above  All other systems have been reviewed and were otherwise negative with the exception of those mentioned in the HPI and as above.  Objective: Vital signs in last 24 hours: Temp:  [99 F (37.2 C)-100.1 F (37.8 C)] 100.1 F (37.8 C) (11/15 1523) Pulse Rate:  [96-102] 96 (11/15 1523) Resp:  [16-20] 20 (11/15 1523) BP: (110-127)/(68-80) 127/80 (11/15 1523) SpO2:  [91 %-99 %] 99 % (11/15 1525)  General Appearance: Alert, cooperative, no distress, appears stated age Head: Normocephalic, without obvious abnormality, atraumatic Eyes: PERRL, conjunctiva/corneas clear, EOM's intact     Throat: Lips, mucosa, and tongue normal; teeth and gums normal Neck: Supple, symmetrical Back: Tender to palpation over L4-5 area Lungs: Clear to auscultation bilaterally, respirations unlabored Heart: Regular rate and rhythm Abdomen: Soft Extremities: Extremities normal, atraumatic, no cyanosis or edema Pulses: 2+ and symmetric all extremities Skin: Skin color, texture, turgor normal, no rashes or lesions  NEUROLOGIC:   Mental status: alert and oriented, no aphasia, good attention span, Fund of knowledge/ memory ok Motor Exam - grossly normal except for left foot drop 2/5 and right dorsiflexor weakness 3 out of 5 to 4- out of 5 Sensory Exam - grossly normal Reflexes: Not tested Coordination - grossly normal Gait -not tested Balance -not tested Cranial Nerves: I: smell Not tested  II: visual acuity  OS: N/A    OD: N/A  II: visual fields Full to confrontation  II: pupils Equal, round, reactive to light  III,VII: ptosis None  III,IV,VI: extraocular muscles  Full ROM  V: mastication Normal  V: facial light touch sensation  Normal  V,VII: corneal reflex  Present  VII: facial muscle function - upper  Normal  VII: facial muscle  function - lower Normal  VIII: hearing Not tested  IX: soft palate elevation  Normal  IX,X: gag reflex Present  XI: trapezius strength  5/5  XI: sternocleidomastoid strength 5/5  XI: neck flexion strength  5/5  XII: tongue strength  Normal    Data Review Lab Results  Component Value Date   WBC 15.3 (H) 08/29/2024   HGB 11.6 (L) 08/29/2024   HCT 34.0 (L) 08/29/2024   MCV 94.2 08/29/2024   PLT 180 08/29/2024   Lab Results  Component Value Date   NA 138 08/29/2024   K 3.1 (L) 08/29/2024   CL 109 08/29/2024   CO2 23 08/29/2024   BUN 22 08/29/2024   CREATININE 2.10 (H) 08/29/2024   GLUCOSE 103 (H) 08/29/2024   Lab Results  Component Value Date   INR 1.0 10/17/2022    Assessment/Plan: 73 year old female with a grade 1 anterolisthesis at L4-5 with severe spinal stenosis, now with fever and leukocytosis and worsening of back pain with the left foot drop.  Left foot drop may be acute but it is difficult to know based on the history.  Certainly she has had more trouble getting around the last 2 days.  She was having significant To immobilization prior to the last 2 days and had to use a walker but it has been even more difficult the last 2 days.  Unfortunately she is on Plavix  and took that just yesterday.  That limits what we can do from an interventional standpoint.  Talk to cardiology, Dr. Anner, about cardiac clearance should she need surgery Hold Plavix  Await new MRI done without contrast because of her serum creatinine, but there were subtle findings on the previous MRI that may be wonder if she had an infectious process.  It could have just been from an inflammatory process.  Await sed rate and CRP. Urinalysis I think she would need decompression and instrumented fusion at L4-5 but will have to wait for a few days because of the Plavix .  This is unfortunate given her foot drop on the left.  All of this has been explained to the family as best I can.   Alm GORMAN Molt 08/29/2024 4:25 PM

## 2024-08-29 NOTE — ED Triage Notes (Signed)
 Pt BIB [facility] from home. Pt c/o lower back pain. Pt has hx of bulging disc in back that she gets injections in. Last injection was in October and afterwards she experienced one sided leg pain and trouble walking. Negative stroke screen with EMS. Decreased motor ability in leg but sensation and pulses intact. MRI was done recently but pt states follow up with doc to review isnt until December.

## 2024-08-29 NOTE — Consult Note (Signed)
 Initial Consultation Note   Patient: Samantha Mcbride FMW:[id number] DOB: Feb 29, 1951 PCP: Samantha Lenis, MD DOA: 08/29/2024 DOS: the patient was seen and examined on 08/29/2024 Primary service: Samantha Mcbride Hamilton, MD  Referring physician:  Reason for consult:  medical management  Assessment/Plan: Samantha Mcbride 73 year old female who presented for worsening back pain and left leg weakness with reports of inability to ambulate.  Found to have concern for grade 1 anterolisthesis at L4-5 with severe spinal stenosis most recent imaging study for which neurosurgery admitted the patient and plan on possible intervention.  Rechecking MRI at this time.  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  Patient presents with worsening back pain and left foot drop.  Recent imaging showing grade 1 anterolisthesis at L4-5 with severe spinal stenosis.  Currently given Decadron  10 mg IV.   - Follow-up MRI lumbar spine without contrast - Per neurosurgery  Leukocytosis Low-grade fever Acute.  Patient noted to have a low-grade fever 100.1 F and WBC elevated at 15.3.[name]  This could possibly be secondary to atelectasis she has been in the bed here last couple of days versus possibility of underlying infection. - Incentive spirometry and flutter valve - Follow-up ESR and CRP - Check blood culture - Check chest x-ray and urinalysis  Hypokalemia Potassium noted to be mildly low at 3.2. - Give potassium chloride  40 meq x 1 dose  Question urinary retention Chronic kidney disease stage IV Patient noted to have some suprapubic tenderness on palpation on physical exam.  Creatinine noted to be 1.98 with BUN 20.  This appears to be around patient's baseline 1.6-2. - Check serial bladder scans for concern for urinary retention - In-N-Out cath as needed/orders placed for Foley catheter persistent urinary retention - Check urinalysis - Avoid  possible nephrotoxic agents - Continue to monitor  Essential hypertension Blood pressures currently maintained. - Continue carvedilol  as tolerated  COPD No significant wheezes or rhonchi appreciated on physical exam at this time. - Breathing treatments as needed  Coronary artery disease Hyperlipidemia Patient with a history of STEMI.  No anginal symptoms reported. - Hold Plavix  - Continue beta-blocker and Crestor   Rheumatoid arthritis - Continue hydroxychloroquine  and leflunomide   History of breast cancer  TRH will continue to follow the patient.  HPI: Samantha Mcbride is a 73 y.o. female with past medical history of CAD s/p STEMI and DES, peri-infarct V. tach, COPD, CKD 4, RA, RLS, HLD, hypertension, obesity, breast cancer in remission who presents with complaints of back pain and leg weakness for which she has been unable to ambulate without assistance. Symptoms had been progressively worsening for the last few months, but acutely worsened over the past 2 days.  The patient initially experienced lower back pain that radiates down the left leg. This has led to her to have significant weakness in her left leg.  She reports that her pain has become severe, described as aching and persistent throughout the day, with exacerbation during any kind of movement or activity.  The patient received her last epidural back injection sometime in October and experienced worsening pain and left lower extremity weakness 2 days afterward. She was last seen in the ED for lower back pain back on 10/21 or at that time a CT scan performed showing severe spinal stenosis at L4-L5, with facet arthrosis at L4-L5 and L5-S1. The patient was discharged at that time, but reported that her symptoms just continued to get worse. She had a  recent outpatient MRI of the lumbar spine without contrast, which revealed severe spinal stenosis and spondylolisthesis at L4-5.    There was mention that she had some incontinence of  her bowels. The patient denies any recent fevers or chills, but noted to have a low-grade fever while in the ER today 100.1 F. She has been using muscle relaxants and tramadol for pain management, with minimal relief. Her creatinine today is 2.1.  Orders were placed for repeat MRI of the lumbar spine without contrast.  Neurosurgery placed orders for admission.  Surgical intervention noted to be possibly be delayed due to patient recently taking Plavix .  Patient had been given Decadron  10 mg IV.  Review of Systems: As mentioned in the history of present illness. All other systems reviewed and are negative. Past Medical History:  Diagnosis Date   Anemia    iron deficiency anemia    CAD S/P percutaneous coronary angioplasty 08/2011   Promus DES - mid LAD 3.5 mm x 24 mm (3.72 mm)    Cancer (HCC)    Chronic kidney disease    stage 4 kidney disease per pt dx 02/2019   Complication of anesthesia    Dyslipidemia, goal LDL below 70     on statin, close to goal   Family history of breast cancer    Family history of skin cancer    Former moderate cigarette smoker (10-19 per day)    Glucose intolerance (impaired glucose tolerance)    H/O thyroid  nodule    Benign   History 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 --> Promus DES =>  EF 40 to 45% (similar to 2012) stable wall motion normality (severe HK of midapical anterior apical wall-consistent with prior infarct).  GRII DD.  Normal valves. => Stable compared to 08/30/2011.  EF was still 40 to 45%.   Hypertension    Personal history of radiation therapy    PONV (postoperative nausea and vomiting)    no issues with surgery on 05-11-2019   Rheumatoid arthritis (HCC)    managed on humira    SOB (shortness of breath) on exertion    reports its been that way since my heart attack  repots no recurrence of MI sx since that time    Past Surgical History:  Procedure Laterality  Date   ABDOMINAL AORTAGRAM N/A 08/27/2011   Procedure: ABDOMINAL EZELLA;  Surgeon: Debby DELENA Sor, MD;  Location: [facility] CATH LAB;  Service: Cardiovascular;  Laterality: N/A;   ANKLE SURGERY Right 1995   per pt ankle surgery [facility] at [facility]    APPENDECTOMY     BREAST EXCISIONAL BIOPSY Left    BREAST LUMPECTOMY Right 07/2020   BREAST LUMPECTOMY WITH RADIOACTIVE SEED AND SENTINEL LYMPH NODE BIOPSY Right 08/05/2020   Procedure: RIGHT BREAST LUMPECTOMY WITH RADIOACTIVE SEED AND SENTINEL LYMPH NODE BIOPSY;  Surgeon: Curvin Deward MOULD, MD;  Location: [facility] OR;  Service: General;  Laterality: Right;   CYSTOSCOPY WITH RETROGRADE PYELOGRAM, URETEROSCOPY AND STENT PLACEMENT Bilateral 05/11/2019   Procedure: CYSTOSCOPY WITH RETROGRADE PYELOGRAM,  AND STENT PLACEMENT;  Surgeon: Carolee Sherwood JONETTA MOULD, MD;  Location: [facility] ORS;  Service: Urology;  Laterality: Bilateral;   CYSTOSCOPY/URETEROSCOPY/HOLMIUM LASER/STENT PLACEMENT Bilateral 05/25/2019   Procedure: CYSTOSCOPY BILATERAL URETEROSCOPY/HOLMIUM LASER/STENT PLACEMENT;  Surgeon: Carolee Sherwood JONETTA MOULD, MD;  Location: [facility] ORS;  Service: Urology;  Laterality: Bilateral;   INTRAMEDULLARY (IM) NAIL INTERTROCHANTERIC Left 10/18/2022   Procedure: INTRAMEDULLARY NAILING OF LEFT  FEMUR;  Surgeon: Celena Sharper, MD;  Location: [facility] OR;  Service: Orthopedics;  Laterality: Left;   LEFT HEART CATHETERIZATION WITH CORONARY ANGIOGRAM N/A 08/27/2011   Procedure: LEFT HEART CATHETERIZATION WITH CORONARY ANGIOGRAM;  Surgeon: Debby DELENA Sor, MD;  Location: [facility] CATH LAB;  Service: Cardiovascular;;For anterior STEMI/cardiac arrest -- 100% mid LAD occlusion   PERCUTANEOUS CORONARY STENT INTERVENTION (PCI-S) N/A 08/27/2011   Procedure: PERCUTANEOUS CORONARY STENT INTERVENTION (PCI-S);  Surgeon: Debby DELENA Sor, MD;  Location: [facility] CATH LAB;  Service: Cardiovascular;;  mid LAD PCI --> Promus Element DES 3.5 mm at 24 mm (3.72 mm)   TRANSTHORACIC ECHOCARDIOGRAM  08/2011   EF 40-45%, moderate at K. of mid and  distal inferior septum and anterior apical myocardium. Grade 1 diastolic function. -- Followup echocardiogram to reassess his EF was denied by insurance company   TRANSTHORACIC ECHOCARDIOGRAM  07/20/2020    EF 40 to 45% (similar to 2012) stable wall motion normality (severe HK of midapical anterior apical wall-consistent with prior infarct).  GRII DD.  Normal valves. => Stable compared to 08/30/2011.  EF was still 40 to 45%.   Social History:  reports that she quit smoking about 13 years ago. Her smoking use included cigarettes. She started smoking about 53 years ago. She has a 40 pack-year smoking history. She has never used smokeless tobacco. She reports that she does not drink alcohol and does not use drugs.  No Known Allergies  Family History  Problem Relation Age of Onset   Hypertension Mother    Breast cancer Mother 55       early 7's   Heart failure Father    Heart attack Paternal Grandfather    Breast cancer Sister 4   Skin cancer Sister 3       face    Prior to Admission medications   Medication Sig Start Date End Date Taking? Authorizing Provider  acetaminophen  (TYLENOL ) 500 MG tablet Take 500 mg by mouth every 6 (six) hours as needed for moderate pain or headache.   Yes [provider]  Calcium  Citrate-Vitamin D  (CALCIUM  + D PO) Take 1 tablet by mouth daily.   Yes [provider]  carvedilol  (COREG ) 12.5 MG tablet Take 12.5 mg by mouth 2 (two) times daily. 04/01/19  Yes [provider]  clopidogrel  (PLAVIX ) 75 MG tablet TAKE 1 TABLET BY MOUTH EVERY DAY 07/01/24  Yes Anner Alm ORN, MD  diclofenac sodium (VOLTAREN) 1 % GEL Apply 2 g topically 4 (four) times daily as needed (pain).    Yes [provider]  DM-APAP-CPM (CORICIDIN HBP PO) Take 1 tablet by mouth daily as needed (allergies).   Yes [provider]  ezetimibe  (ZETIA ) 10 MG tablet TAKE 1 TABLET BY MOUTH EVERY DAY 07/07/24  Yes Anner Alm ORN, MD  gabapentin  (NEURONTIN )  100 MG capsule TAKE 3 CAPSULES BY MOUTH AT BEDTIME Patient taking differently: Take 300 mg by mouth every evening. 06/03/23  Yes Hyatt, Max T, DPM  hydroxychloroquine  (PLAQUENIL ) 200 MG tablet Take 1 tablet (200 mg total) by mouth daily. 06/15/20  Yes Gudena, Vinay, MD  leflunomide  (ARAVA ) 20 MG tablet Take 20 mg by mouth daily. 01/09/21  Yes [provider]  lidocaine  (LIDODERM ) 5 % Place 1 patch onto the skin daily. Remove & Discard patch within 12 hours or as directed by MD 08/04/24  Yes Ula Prentice SAUNDERS, MD  Melatonin 5 MG CAPS Take 5 mg by mouth every evening.   Yes [provider]  rosuvastatin  (  CRESTOR ) 20 MG tablet TAKE 1 TABLET BY MOUTH EVERY DAY 10/15/23  Yes Anner Alm ORN, MD  tiZANidine (ZANAFLEX) 2 MG tablet Take 2 mg by mouth at bedtime as needed.   Yes [provider]  traMADol (ULTRAM) 50 MG tablet Take 1 tablet (50 mg total) by mouth every 12 (twelve) hours as needed. Patient taking differently: Take 25 mg by mouth every 12 (twelve) hours as needed for moderate pain (pain score 4-6). 08/04/24  Yes Ula Prentice SAUNDERS, MD  traZODone  (DESYREL ) 100 MG tablet Take 100 mg by mouth at bedtime.    Yes [provider]  nitroGLYCERIN  (NITROSTAT ) 0.4 MG SL tablet PLACE 1 TABLET UNDER THE TONGUE EVERY 5 MINUTES AS NEEDED FOR CHEST PAIN. Patient not taking: Reported on 08/29/2024 08/01/15   Anner Alm ORN, MD    Physical Exam: Vitals:   08/29/24 1109 08/29/24 1523 08/29/24 1525  BP: 110/68 127/80   Pulse: (!) 102 96   Resp: 16 20   Temp: 99 F (37.2 C) 100.1 F (37.8 C)   TempSrc:  Oral   SpO2: 91% 99% 99%    Constitutional: Elderly female who appears to be in distress Eyes: PERRL, lids and conjunctivae normal ENMT: Mucous membranes are moist. Posterior pharynx clear of any exudate or lesions.Normal dentition.  Neck: normal, supple, no masses, no thyromegaly Respiratory: clear to auscultation bilaterally, no wheezing, no crackles. Normal respiratory  effort. No accessory muscle use.  Cardiovascular: Regular rate and rhythm, no murmurs / rubs / gallops. No extremity edema. 2+ pedal pulses. No carotid bruits.  Abdomen: Tenderness palpation suprapubically.. Bowel sounds positive.  Musculoskeletal: no clubbing / cyanosis. No joint deformity upper and lower extremities. Good ROM, no contractures. Normal muscle tone.  Skin: no rashes, lesions, ulcers. No induration Neurologic: CN 2-12 grossly intact. Sensation intact, DTR normal. Strength 5/5 in all 4.  Psychiatric: Normal judgment and insight. Alert and oriented x 3. Normal mood.   Data Reviewed:   reviewed labs, imaging, and pertinent records as documented   Family Communication:   Primary team communication:   Thank you very much for involving us  in the care of your patient.  Author: Maximino DELENA Sharps, MD 08/29/2024 4:37 PM  For on call review [email or web address].

## 2024-08-29 NOTE — ED Provider Notes (Signed)
 [place] EMERGENCY DEPARTMENT AT [facility] Provider Note   CSN: [id number] Arrival date & time: 08/29/24  1049     Patient presents with: Back Pain   Samantha Mcbride is a 73 y.o. female.   73 year old female history of CAD on aspirin  and Plavix , lumbar radiculopathy, and spinal stenosis who presents emergency department for back pain and left lower extremity pain and weakness.  Patient reports that she gets frequent back injections and had 1 on 07/23/2024.  2 days afterwards started experiencing worsening pain and left lower extremity weakness.  Says that the back pain has been approximately the L4 region and radiates down her left lower leg.  Has been having foot drop and difficulty using her leg at home.  Came to the emergency department on 10/21 and had a CT that showed severe spinal stenosis at L4-L5 and facet arthrosis at L4-L5 and L5-S1.  Was discharged home but reports the weakness has been worsening.  No bowel or bladder incontinence.  No fevers or chills.  No back surgeries.  Reportedly had an MRI as an outpatient but her daughter is bringing in this point       Prior to Admission medications   Medication Sig Start Date End Date Taking? Authorizing Provider  acetaminophen  (TYLENOL ) 500 MG tablet Take 500 mg by mouth every 6 (six) hours as needed for moderate pain or headache.   Yes [provider]  Calcium  Citrate-Vitamin D  (CALCIUM  + D PO) Take 1 tablet by mouth daily.   Yes [provider]  carvedilol  (COREG ) 12.5 MG tablet Take 12.5 mg by mouth 2 (two) times daily. 04/01/19  Yes [provider]  clopidogrel  (PLAVIX ) 75 MG tablet TAKE 1 TABLET BY MOUTH EVERY DAY 07/01/24  Yes Anner Alm ORN, MD  diclofenac sodium (VOLTAREN) 1 % GEL Apply 2 g topically 4 (four) times daily as needed (pain).    Yes [provider]  DM-APAP-CPM (CORICIDIN HBP PO) Take 1 tablet by mouth daily as needed (allergies).   Yes [provider]   ezetimibe  (ZETIA ) 10 MG tablet TAKE 1 TABLET BY MOUTH EVERY DAY 07/07/24  Yes Anner Alm ORN, MD  gabapentin  (NEURONTIN ) 100 MG capsule TAKE 3 CAPSULES BY MOUTH AT BEDTIME Patient taking differently: Take 300 mg by mouth every evening. 06/03/23  Yes Hyatt, Max T, DPM  hydroxychloroquine  (PLAQUENIL ) 200 MG tablet Take 1 tablet (200 mg total) by mouth daily. 06/15/20  Yes Gudena, Vinay, MD  leflunomide  (ARAVA ) 20 MG tablet Take 20 mg by mouth daily. 01/09/21  Yes [provider]  lidocaine  (LIDODERM ) 5 % Place 1 patch onto the skin daily. Remove & Discard patch within 12 hours or as directed by MD 08/04/24  Yes Ula Prentice SAUNDERS, MD  Melatonin 5 MG CAPS Take 5 mg by mouth every evening.   Yes [provider]  rosuvastatin  (CRESTOR ) 20 MG tablet TAKE 1 TABLET BY MOUTH EVERY DAY 10/15/23  Yes Anner Alm ORN, MD  tiZANidine (ZANAFLEX) 2 MG tablet Take 2 mg by mouth at bedtime as needed.   Yes [provider]  traMADol (ULTRAM) 50 MG tablet Take 1 tablet (50 mg total) by mouth every 12 (twelve) hours as needed. Patient taking differently: Take 25 mg by mouth every 12 (twelve) hours as needed for moderate pain (pain score 4-6). 08/04/24  Yes Ula Prentice SAUNDERS, MD  traZODone  (DESYREL ) 100 MG tablet Take 100 mg by mouth at bedtime.    Yes [provider]  nitroGLYCERIN  (NITROSTAT ) 0.4 MG SL tablet PLACE 1 TABLET UNDER THE TONGUE EVERY 5 MINUTES AS NEEDED FOR CHEST PAIN. Patient not taking: Reported on 08/29/2024 08/01/15   Anner Alm ORN, MD    Allergies: Patient has no known allergies.    Review of Systems  Updated Vital Signs BP 127/80 (BP Location: Left Arm)   Pulse 96   Temp 100.1 F (37.8 C) (Oral) Comment: Provider made aware  Resp 20   SpO2 99%   Physical Exam Constitutional:      Appearance: Normal appearance.  Musculoskeletal:     Comments: No spinal midline TTP in cervical or thoracic spine.  Lumbar spinal tenderness to palpation at L4 both midline and  paraspinal on the left.  No stepoffs noted.   Motor: Muscle bulk and tone are normal.  Right lower extremity strength is 5/5 in hip flexion, knee flexion and extension, ankle dorsiflexion and plantar flexion.  Decreased strength of great toe dorsiflexion of the right lower extremity.  Left lower extremity strength is 3/5 in hip flexion, 4/5 knee flexion and extension, 2/5 ankle dorsiflexion and 4/5 plantar flexion.  Decreased strength of great toe dorsiflexion of the right lower extremity.  Sensory: Intact sensation to light touch in L2 though S1 dermatomes bilaterally.   Reflexes: Patellar 3+ bilaterally, Achilles 2+ bilaterally, no ankle clonus bilaterally  Neurological:     Mental Status: She is alert.     (all labs ordered are listed, but only abnormal results are displayed) Labs Reviewed  CBC WITH DIFFERENTIAL/PLATELET - Abnormal; Notable for the following components:      Result Value   WBC 15.3 (*)    Hemoglobin 11.9 (*)    Neutro Abs 13.3 (*)    All other components within normal limits  COMPREHENSIVE METABOLIC PANEL WITH GFR - Abnormal; Notable for the following components:   Potassium 3.2 (*)    Glucose, Bld 106 (*)    Creatinine, Ser 1.98 (*)    Total Protein 6.2 (*)    Albumin  3.2 (*)    GFR, Estimated 26 (*)    All other components within normal limits  I-STAT CHEM 8, ED - Abnormal; Notable for the following components:   Potassium 3.1 (*)    Creatinine, Ser 2.10 (*)    Glucose, Bld 103 (*)    Calcium , Ion 0.92 (*)    Hemoglobin 11.6 (*)    HCT 34.0 (*)    All other components within normal limits  RESP PANEL BY RT-PCR (RSV, FLU A&B, COVID)  RVPGX2  URINALYSIS, ROUTINE W REFLEX MICROSCOPIC  SEDIMENTATION RATE  C-REACTIVE PROTEIN    EKG: None  Radiology: No results found.   Procedures   Medications Ordered in the ED  lidocaine  (LIDODERM ) 5 % 1 patch (1 patch Transdermal Patch Applied 08/29/24 1150)  dexamethasone  (DECADRON ) injection 10 mg (10 mg  Intravenous Given 08/29/24 1202)  oxyCODONE  (Oxy IR/ROXICODONE ) immediate release tablet 5 mg (5 mg Oral Given 08/29/24 1150)  HYDROmorphone  (DILAUDID ) injection 0.5 mg (0.5 mg Intravenous Given 08/29/24 1541)  acetaminophen  (TYLENOL ) tablet 1,000 mg (1,000 mg Oral Given 08/29/24 1540)  ondansetron  (ZOFRAN ) injection 4 mg (4 mg Intravenous Given 08/29/24 1541)    Clinical Course as of 08/29/24 1622  Sat Aug 29, 2024  1251 Creatinine(!): 1.98 At baseline [RP]  1440 Kimberly Myeran from neurosurgery will come and evaluate for cauda equina. Does not feel she needs repeat MRI at this time [RP]  1605 Handoff RCP 73 yo/f Hx L spine stenosis w/  radiculopathy  Recent L spine injection, pain/numbness following, now left foot drop, LLE weak, bowel incontinence, fever MRI 11/4 severe/mod stenosis No current surg plans per nsgy (plavix ) Pending MRI L w/o  Admit TRH [SG]    Clinical Course User Index [RP] Yolande Lamar BROCKS, MD [SG] Elnor Jayson LABOR, DO                                 Medical Decision Making Amount and/or Complexity of Data Reviewed Labs: ordered. Decision-making details documented in ED Course. Radiology: ordered.  Risk OTC drugs. Prescription drug management. Decision regarding hospitalization.   73 year old female history of CAD on aspirin  Plavix , lumbar radiculopathy, and spinal stenosis who presents emergency department with back and left lower extremity pain  Initial Ddx:  Cauda equina syndrome, lumbar radiculopathy, pathologic fracture, spinal epidural hematoma, spinal epidural abscess  MDM/Course:  Patient presents to the emergency department with back pain that radiates down her left lower extremity.  Says it has been present for several weeks and started after getting an injection in her back.  No fevers or chills.  She does report that her left leg has been getting weaker and weaker.  Initially denied bowel or bladder incontinence but when her daughter  arrived reported that in the past 24 hours she has been having bowel movements on herself without noticing it.  No urinary incontinence.  Her daughter also reports that she has been basically bedbound at home for the past 3 weeks as well because her symptoms are so severe.  On exam does have weakness in the left lower extremity.  Does appear to have increased reflexes as well.  Daughter brought MRI report from 08/18/2024 which shows L4-L5 progressive moderate to severe canal stenosis with mild to moderate bilateral foraminal stenosis.  Due to concerns for cauda equina syndrome patient was given Decadron .  Also was given pain medication and neurosurgery was consulted.    While in the emergency department the patient developed a temperature of 100.3 F.  She not having any infectious symptoms at this point in time.  Was seen by neurosurgery who recommended admission to the hospital and MRI.  They are going to have to wait several days to operate because of the Plavix  that is in her system.  Recommended holding off on steroids until the MRI results.  If it does show infection will need antibiotics as well.  Discussed with Dr. Claudene from hospitalist for admission.  This patient presents to the ED for concern of complaints listed in HPI, this involves an extensive number of treatment options, and is a complaint that carries with it a high risk of complications and morbidity. Disposition including potential need for admission considered.   Dispo: Admit to Floor  Additional history obtained from daughter Records reviewed Outpatient Clinic Notes The following labs were independently interpreted: Chemistry and show CKD I have reviewed the patients home medications and made adjustments as needed Consults: Neurosurgery Social Determinants of health:  Geriatric  Portions of this note were generated with Scientist, clinical (histocompatibility and immunogenetics). Dictation errors may occur despite best attempts at proofreading.     Final  diagnoses:  Acute left-sided low back pain with left-sided sciatica  Weakness of left lower extremity  Incontinence of feces, unspecified fecal incontinence type  Fever, unspecified fever cause    ED Discharge Orders     None          Yolande Lamar BROCKS,  MD 08/29/24 1622

## 2024-08-29 NOTE — Progress Notes (Signed)
 Patient ID: Samantha Mcbride, female   DOB: July 09, 1951, 73 y.o.   MRN: [id number] I reviewed the MRI of her lumbar spine.  It was done without contrast because of her creatinine.  However, I think it shows small abscesses within the muscle at L4-5, probably septic arthritis, with epidural abscess with severe spinal stenosis.  Given her high sed rate and CRP and white count and fever and rapid pain, I suspect this is epidural abscess.  I have explained this to the patient.  Unfortunately, this becomes an urgent procedure and now I am recommending a left L4-5 hemilaminectomy tomorrow for drainage of what I think is going to be an epidural abscess.  We will culture it at that time.  She will be n.p.o. after midnight.  Will obtain consent.  She understands the risk of the surgery include but are not limited to bleeding, infection, CSF leak, nerve injury, numbness, weakness, loss of bowel bladder or sexual function, instability, need for further surgery, lack of relief of pain, worsening pain, and anesthesia risks.  She agrees to proceed.  She understands the risk benefits and alternatives.

## 2024-08-30 ENCOUNTER — Inpatient Hospital Stay (HOSPITAL_COMMUNITY): Admitting: Registered Nurse

## 2024-08-30 ENCOUNTER — Encounter (HOSPITAL_COMMUNITY): Payer: Self-pay | Admitting: Neurological Surgery

## 2024-08-30 ENCOUNTER — Inpatient Hospital Stay (HOSPITAL_COMMUNITY)

## 2024-08-30 ENCOUNTER — Inpatient Hospital Stay (HOSPITAL_COMMUNITY)
Admission: EM | Disposition: A | Discharge status: Skilled Nursing Facility | Payer: Self-pay | Source: Home / Self Care | Attending: Neurological Surgery

## 2024-08-30 DIAGNOSIS — I10 Essential (primary) hypertension: Secondary | ICD-10-CM | POA: Diagnosis not present

## 2024-08-30 DIAGNOSIS — Z9889 Other specified postprocedural states: Secondary | ICD-10-CM

## 2024-08-30 DIAGNOSIS — M48061 Spinal stenosis, lumbar region without neurogenic claudication: Secondary | ICD-10-CM

## 2024-08-30 DIAGNOSIS — I2510 Atherosclerotic heart disease of native coronary artery without angina pectoris: Secondary | ICD-10-CM | POA: Diagnosis not present

## 2024-08-30 DIAGNOSIS — D72829 Elevated white blood cell count, unspecified: Secondary | ICD-10-CM | POA: Diagnosis not present

## 2024-08-30 DIAGNOSIS — R509 Fever, unspecified: Secondary | ICD-10-CM | POA: Diagnosis not present

## 2024-08-30 DIAGNOSIS — Z87891 Personal history of nicotine dependence: Secondary | ICD-10-CM | POA: Diagnosis not present

## 2024-08-30 DIAGNOSIS — M4316 Spondylolisthesis, lumbar region: Secondary | ICD-10-CM | POA: Diagnosis not present

## 2024-08-30 DIAGNOSIS — N184 Chronic kidney disease, stage 4 (severe): Secondary | ICD-10-CM | POA: Diagnosis not present

## 2024-08-30 HISTORY — PX: LUMBAR LAMINECTOMY/DECOMPRESSION MICRODISCECTOMY: SHX5026

## 2024-08-30 LAB — CBC WITH DIFFERENTIAL/PLATELET
Abs Immature Granulocytes: 0.09 K/uL — ABNORMAL HIGH (ref 0.00–0.07)
Basophils Absolute: 0 K/uL (ref 0.0–0.1)
Basophils Relative: 0 %
Eosinophils Absolute: 0 K/uL (ref 0.0–0.5)
Eosinophils Relative: 0 %
HCT: 31.9 % — ABNORMAL LOW (ref 36.0–46.0)
Hemoglobin: 10 g/dL — ABNORMAL LOW (ref 12.0–15.0)
Immature Granulocytes: 1 %
Lymphocytes Relative: 5 %
Lymphs Abs: 0.6 K/uL — ABNORMAL LOW (ref 0.7–4.0)
MCH: 30.2 pg (ref 26.0–34.0)
MCHC: 31.3 g/dL (ref 30.0–36.0)
MCV: 96.4 fL (ref 80.0–100.0)
Monocytes Absolute: 0.4 K/uL (ref 0.1–1.0)
Monocytes Relative: 3 %
Neutro Abs: 11.8 K/uL — ABNORMAL HIGH (ref 1.7–7.7)
Neutrophils Relative %: 91 %
Platelets: 180 K/uL (ref 150–400)
RBC: 3.31 MIL/uL — ABNORMAL LOW (ref 3.87–5.11)
RDW: 13.3 % (ref 11.5–15.5)
WBC: 12.9 K/uL — ABNORMAL HIGH (ref 4.0–10.5)
nRBC: 0 % (ref 0.0–0.2)

## 2024-08-30 LAB — COMPREHENSIVE METABOLIC PANEL WITH GFR
ALT: 10 U/L (ref 0–44)
AST: 16 U/L (ref 15–41)
Albumin: 2.7 g/dL — ABNORMAL LOW (ref 3.5–5.0)
Alkaline Phosphatase: 48 U/L (ref 38–126)
Anion gap: 11 (ref 5–15)
BUN: 23 mg/dL (ref 8–23)
CO2: 21 mmol/L — ABNORMAL LOW (ref 22–32)
Calcium: 8.3 mg/dL — ABNORMAL LOW (ref 8.9–10.3)
Chloride: 109 mmol/L (ref 98–111)
Creatinine, Ser: 1.86 mg/dL — ABNORMAL HIGH (ref 0.44–1.00)
GFR, Estimated: 28 mL/min — ABNORMAL LOW (ref >60)
Glucose, Bld: 154 mg/dL — ABNORMAL HIGH (ref 70–99)
Potassium: 4.1 mmol/L (ref 3.5–5.1)
Sodium: 141 mmol/L (ref 135–145)
Total Bilirubin: 0.5 mg/dL (ref 0.0–1.2)
Total Protein: 5.5 g/dL — ABNORMAL LOW (ref 6.5–8.1)

## 2024-08-30 LAB — MAGNESIUM: Magnesium: 2.4 mg/dL (ref 1.7–2.4)

## 2024-08-30 LAB — ABO/RH: ABO/RH(D): B POS

## 2024-08-30 LAB — SURGICAL PCR SCREEN
MRSA, PCR: NEGATIVE
Staphylococcus aureus: NEGATIVE

## 2024-08-30 LAB — PHOSPHORUS: Phosphorus: 2.6 mg/dL (ref 2.5–4.6)

## 2024-08-30 LAB — TYPE AND SCREEN
ABO/RH(D): B POS
Antibody Screen: NEGATIVE

## 2024-08-30 SURGERY — LUMBAR LAMINECTOMY/DECOMPRESSION MICRODISCECTOMY 1 LEVEL
Anesthesia: General | Site: Back

## 2024-08-30 MED ORDER — ORAL CARE MOUTH RINSE: 15.0000 mL | Freq: Once | OROMUCOSAL | Status: AC

## 2024-08-30 MED ORDER — LACTATED RINGERS IV SOLN: INTRAVENOUS | Status: DC

## 2024-08-30 MED ORDER — 0.9 % SODIUM CHLORIDE (POUR BTL) OPTIME
TOPICAL | Status: DC | PRN
  Administered 2024-08-30: 1000 mL

## 2024-08-30 MED ORDER — FENTANYL CITRATE (PF) 100 MCG/2ML IJ SOLN
25.0000 ug | INTRAMUSCULAR | Status: DC | PRN
  Administered 2024-08-30 (×3): 50 ug via INTRAVENOUS

## 2024-08-30 MED ORDER — BUPIVACAINE HCL (PF) 0.25 % IJ SOLN
INTRAMUSCULAR | Status: AC
  Filled 2024-08-30: qty 30

## 2024-08-30 MED ORDER — LIDOCAINE 2% (20 MG/ML) 5 ML SYRINGE
INTRAMUSCULAR | Status: DC | PRN
  Administered 2024-08-30: 40 mg via INTRAVENOUS

## 2024-08-30 MED ORDER — ACETAMINOPHEN 500 MG PO TABS
1000.0000 mg | ORAL_TABLET | Freq: Four times a day (QID) | ORAL | Status: AC
  Administered 2024-08-30 – 2024-08-31 (×3): 1000 mg via ORAL
  Filled 2024-08-30 (×3): qty 2

## 2024-08-30 MED ORDER — FENTANYL CITRATE (PF) 100 MCG/2ML IJ SOLN
INTRAMUSCULAR | Status: AC
  Filled 2024-08-30: qty 2

## 2024-08-30 MED ORDER — PHENOL 1.4 % MT LIQD
1.0000 | OROMUCOSAL | Status: DC | PRN
Start: 2024-08-30 — End: 2024-09-05

## 2024-08-30 MED ORDER — ONDANSETRON HCL 4 MG/2ML IJ SOLN: 4.0000 mg | Freq: Four times a day (QID) | INTRAMUSCULAR | Status: DC | PRN

## 2024-08-30 MED ORDER — SENNA 8.6 MG PO TABS
1.0000 | ORAL_TABLET | Freq: Two times a day (BID) | ORAL | Status: DC
  Administered 2024-08-30 – 2024-09-04 (×12): 8.6 mg via ORAL
  Filled 2024-08-30 (×12): qty 1

## 2024-08-30 MED ORDER — THROMBIN 20000 UNITS EX SOLR
CUTANEOUS | Status: AC
  Filled 2024-08-30: qty 20000

## 2024-08-30 MED ORDER — SODIUM CHLORIDE 0.9% FLUSH: 3.0000 mL | INTRAVENOUS | Status: DC | PRN

## 2024-08-30 MED ORDER — LACTATED RINGERS IV SOLN: INTRAVENOUS | Status: DC | PRN

## 2024-08-30 MED ORDER — VANCOMYCIN HCL 750 MG/150ML IV SOLN
750.0000 mg | INTRAVENOUS | Status: AC
  Administered 2024-08-30: 750 mg via INTRAVENOUS
  Filled 2024-08-30: qty 150

## 2024-08-30 MED ORDER — OXYCODONE HCL 5 MG/5ML PO SOLN: 5.0000 mg | Freq: Once | ORAL | Status: DC | PRN

## 2024-08-30 MED ORDER — FENTANYL CITRATE (PF) 250 MCG/5ML IJ SOLN
INTRAMUSCULAR | Status: AC
  Filled 2024-08-30: qty 5

## 2024-08-30 MED ORDER — MENTHOL 3 MG MT LOZG: 1.0000 | LOZENGE | OROMUCOSAL | Status: DC | PRN

## 2024-08-30 MED ORDER — SODIUM CHLORIDE 0.9 % IV SOLN: 250.0000 mL | INTRAVENOUS | Status: AC

## 2024-08-30 MED ORDER — THROMBIN 5000 UNITS EX SOLR: OROMUCOSAL | Status: DC | PRN

## 2024-08-30 MED ORDER — SODIUM CHLORIDE 0.9 % IV SOLN
2.0000 g | Freq: Two times a day (BID) | INTRAVENOUS | Status: DC
  Administered 2024-08-30 – 2024-08-31 (×3): 2 g via INTRAVENOUS
  Filled 2024-08-30 (×3): qty 20

## 2024-08-30 MED ORDER — THROMBIN 5000 UNITS EX KIT
PACK | CUTANEOUS | Status: AC
  Filled 2024-08-30: qty 1

## 2024-08-30 MED ORDER — SUGAMMADEX SODIUM 200 MG/2ML IV SOLN
INTRAVENOUS | Status: DC | PRN
  Administered 2024-08-30: 200 mg via INTRAVENOUS

## 2024-08-30 MED ORDER — VANCOMYCIN HCL IN DEXTROSE 1-5 GM/200ML-% IV SOLN
1000.0000 mg | Freq: Once | INTRAVENOUS | Status: AC
  Administered 2024-08-30: 1000 mg via INTRAVENOUS
  Filled 2024-08-30: qty 200

## 2024-08-30 MED ORDER — MORPHINE SULFATE (PF) 2 MG/ML IV SOLN
2.0000 mg | INTRAVENOUS | Status: DC | PRN
  Administered 2024-08-30 – 2024-09-03 (×4): 2 mg via INTRAVENOUS
  Filled 2024-08-30 (×4): qty 1

## 2024-08-30 MED ORDER — DROPERIDOL 2.5 MG/ML IJ SOLN: 0.6250 mg | Freq: Once | INTRAMUSCULAR | Status: DC | PRN

## 2024-08-30 MED ORDER — CHLORHEXIDINE GLUCONATE 0.12 % MT SOLN
OROMUCOSAL | Status: AC
  Filled 2024-08-30: qty 15

## 2024-08-30 MED ORDER — VANCOMYCIN HCL 750 MG/150ML IV SOLN: 750.0000 mg | INTRAVENOUS | Status: DC

## 2024-08-30 MED ORDER — ONDANSETRON HCL 4 MG PO TABS: 4.0000 mg | ORAL_TABLET | Freq: Four times a day (QID) | ORAL | Status: DC | PRN

## 2024-08-30 MED ORDER — OXYCODONE HCL 5 MG PO TABS: 5.0000 mg | ORAL_TABLET | Freq: Once | ORAL | Status: DC | PRN

## 2024-08-30 MED ORDER — POTASSIUM CHLORIDE IN NACL 20-0.9 MEQ/L-% IV SOLN
INTRAVENOUS | Status: DC
  Filled 2024-08-30 (×3): qty 1000

## 2024-08-30 MED ORDER — ONDANSETRON HCL 4 MG/2ML IJ SOLN
INTRAMUSCULAR | Status: DC | PRN
  Administered 2024-08-30: 4 mg via INTRAVENOUS

## 2024-08-30 MED ORDER — CHLORHEXIDINE GLUCONATE 0.12 % MT SOLN
15.0000 mL | Freq: Once | OROMUCOSAL | Status: AC
  Administered 2024-08-30: 15 mL via OROMUCOSAL

## 2024-08-30 MED ORDER — BUPIVACAINE HCL (PF) 0.25 % IJ SOLN
INTRAMUSCULAR | Status: DC | PRN
  Administered 2024-08-30: 10 mL

## 2024-08-30 MED ORDER — ROCURONIUM BROMIDE 10 MG/ML (PF) SYRINGE
PREFILLED_SYRINGE | INTRAVENOUS | Status: DC | PRN
  Administered 2024-08-30: 50 mg via INTRAVENOUS

## 2024-08-30 MED ORDER — FENTANYL CITRATE (PF) 250 MCG/5ML IJ SOLN
INTRAMUSCULAR | Status: DC | PRN
  Administered 2024-08-30 (×3): 50 ug via INTRAVENOUS

## 2024-08-30 MED ORDER — SODIUM CHLORIDE 0.9 % IV SOLN
1.0000 g | Freq: Once | INTRAVENOUS | Status: AC
  Administered 2024-08-30: 1 g via INTRAVENOUS
  Filled 2024-08-30: qty 10

## 2024-08-30 MED ORDER — ACETAMINOPHEN 10 MG/ML IV SOLN: 1000.0000 mg | Freq: Once | INTRAVENOUS | Status: DC | PRN

## 2024-08-30 MED ORDER — SODIUM CHLORIDE 0.9% FLUSH
3.0000 mL | Freq: Two times a day (BID) | INTRAVENOUS | Status: DC
  Administered 2024-08-30 – 2024-09-04 (×11): 3 mL via INTRAVENOUS

## 2024-08-30 MED ORDER — DEXAMETHASONE SOD PHOSPHATE PF 10 MG/ML IJ SOLN
INTRAMUSCULAR | Status: DC | PRN
  Administered 2024-08-30: 10 mg via INTRAVENOUS

## 2024-08-30 MED ORDER — HYDROCODONE-ACETAMINOPHEN 7.5-325 MG PO TABS
1.0000 | ORAL_TABLET | Freq: Four times a day (QID) | ORAL | Status: DC
  Administered 2024-08-30 – 2024-09-04 (×21): 1 via ORAL
  Filled 2024-08-30 (×21): qty 1

## 2024-08-30 MED ORDER — PROPOFOL 10 MG/ML IV BOLUS
INTRAVENOUS | Status: AC
  Filled 2024-08-30: qty 20

## 2024-08-30 MED ORDER — PROPOFOL 10 MG/ML IV BOLUS
INTRAVENOUS | Status: DC | PRN
  Administered 2024-08-30: 120 mg via INTRAVENOUS

## 2024-08-30 MED ORDER — THROMBIN 20000 UNITS EX SOLR: CUTANEOUS | Status: DC | PRN

## 2024-08-30 SURGICAL SUPPLY — 36 items
BAG COUNTER SPONGE SURGICOUNT (BAG) ×1 IMPLANT
BAND RUBBER #18 3X1/16 STRL (MISCELLANEOUS) ×2 IMPLANT
BENZOIN TINCTURE PRP APPL 2/3 (GAUZE/BANDAGES/DRESSINGS) ×1 IMPLANT
BUR CARBIDE MATCH 3.0 (BURR) ×1 IMPLANT
CANISTER SUCTION 3000ML PPV (SUCTIONS) ×1 IMPLANT
DRAPE LAPAROTOMY 100X72X124 (DRAPES) ×1 IMPLANT
DRAPE MICROSCOPE LEICA (MISCELLANEOUS) ×1 IMPLANT
DRAPE SURG 17X23 STRL (DRAPES) ×1 IMPLANT
DRSG AQUACEL AG ADV 3.5X 6 (GAUZE/BANDAGES/DRESSINGS) ×1 IMPLANT
DURAPREP 26ML APPLICATOR (WOUND CARE) ×1 IMPLANT
ELECTRODE REM PT RTRN 9FT ADLT (ELECTROSURGICAL) ×1 IMPLANT
GAUZE 4X4 16PLY ~~LOC~~+RFID DBL (SPONGE) IMPLANT
GLOVE BIO SURGEON STRL SZ7 (GLOVE) IMPLANT
GLOVE BIO SURGEON STRL SZ8 (GLOVE) ×1 IMPLANT
GLOVE BIOGEL PI IND STRL 7.0 (GLOVE) IMPLANT
GOWN STRL REUS W/ TWL LRG LVL3 (GOWN DISPOSABLE) IMPLANT
GOWN STRL REUS W/ TWL XL LVL3 (GOWN DISPOSABLE) ×1 IMPLANT
GOWN STRL REUS W/TWL 2XL LVL3 (GOWN DISPOSABLE) IMPLANT
HEMOSTAT POWDER KIT SURGIFOAM (HEMOSTASIS) ×1 IMPLANT
KIT BASIN OR (CUSTOM PROCEDURE TRAY) ×1 IMPLANT
KIT TURNOVER KIT B (KITS) ×1 IMPLANT
NDL HYPO 25X1 1.5 SAFETY (NEEDLE) ×1 IMPLANT
NDL SPNL 20GX3.5 QUINCKE YW (NEEDLE) IMPLANT
NEEDLE HYPO 25X1 1.5 SAFETY (NEEDLE) ×1 IMPLANT
NEEDLE SPNL 20GX3.5 QUINCKE YW (NEEDLE) IMPLANT
PACK LAMINECTOMY NEURO (CUSTOM PROCEDURE TRAY) ×1 IMPLANT
PAD ARMBOARD POSITIONER FOAM (MISCELLANEOUS) ×3 IMPLANT
SOLN 0.9% NACL POUR BTL 1000ML (IV SOLUTION) ×1 IMPLANT
SOLN STERILE WATER BTL 1000 ML (IV SOLUTION) ×1 IMPLANT
STRIP CLOSURE SKIN 1/2X4 (GAUZE/BANDAGES/DRESSINGS) ×1 IMPLANT
SUT VIC AB 0 CT1 18XCR BRD8 (SUTURE) ×1 IMPLANT
SUT VIC AB 2-0 CP2 18 (SUTURE) ×1 IMPLANT
SUT VIC AB 3-0 SH 8-18 (SUTURE) ×1 IMPLANT
TOWEL GREEN STERILE (TOWEL DISPOSABLE) ×1 IMPLANT
TOWEL GREEN STERILE FF (TOWEL DISPOSABLE) ×1 IMPLANT
TUBE CONNECTING 12X1/4 (SUCTIONS) IMPLANT

## 2024-08-30 NOTE — Hospital Course (Addendum)
 Samantha Mcbride 73 year old female who presented for worsening back pain and left leg weakness with reports of inability to ambulate.  Found to have concern for grade 1 anterolisthesis at L4-5 with severe spinal stenosis most recent imaging study for which neurosurgery admitted the patient and plan on possible intervention.  Rechecking MRI at this time and it was done without contrast but was reviewed by neurosurgery who felt that she has small abscesses within the muscle at L4-L5 and probable septic arthritis with epidural abscess with severe spinal stenosis.  Neurosurgery recommended L4-L5 hemilaminectomy and she underwent this along with a medial facetectomy foraminotomies with sublaminar decompression for central and right lateral recess decompression and is POD5.  PT/OT recommending SNF and she is stable from a medical standpoint and will need to follow up with PCP w/in 1-2 weeks.   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  Patient presents with worsening back pain and left foot drop.  Recent imaging showing grade 1 anterolisthesis at L4-5 with severe spinal stenosis.  Currently given Decadron  10 mg IV.   -MRI lumbar spine without contrast done and showed New 19 x 7 mm fluid collection posterior to the thecal sac at L4-L5, unlikely a synovial cyst since it was not present on the prior recent MRI, it could be a liquified hematoma or abscess but is certainly contributing to the patient's tight stenosis; Notably there was also Severe spinal canal and bilateral lateral recess stenosis at L4-L5 due to diffuse disc bulge, advanced facet arthropathy, and ligamentum flavum thickening and Stable posterior cystic structures at L5-S1, likely dissecting synovial cysts. -Neurosurgery evaluated the patient for surgical intervention and she is status post left L4-L5  hemilaminectomy, medial facetectomy foraminotomies with sublaminar decompression for central and right lateral recess decompression -She will continue to follow on antibiotic course and will consider consulting ID pending on results. -Follow up on Aerobic and Aerobic/Anaerobic Cx and both showing NGTD so far  -Continued antibiotics with IV Ceftriaxone  2 g every 12 and IV Vancomycin  750 mg every 48 hours but now IV Vancomycin  is discontinued and just remains on CTX Monotherapy -Pain control with Acetaminophen  1000 mg p.o. Q6h scheduled, Hydrocodone -Acetaminophen  7.5-325 mg 1 tablet p.o. every 6 scheduled, and IV morphine  2 mg Q2 as needed for severe pain: Continue with supportive care with ondansetron  4 mg p.o./IV every 6 as needed nausea vomiting and bowel regimen senna 8.6 mg p.o. twice daily -Also continue with Tizanidine  2 mg p.o. every 8 as needed for muscle spasms -Will need PT/OT to Evaluate and Treat and they are recommending SNF; Neurosurgery feels that the foot drop is improving and they recommended continue work with therapy and obtaining TOC consult for rehab placement and she is medically stable for D/C and awaiting SNF placement and insurance auth submitted for Samantha Mcbride in [place]   ? For UTI given Leukocytosis and Low-grade fever, Improved  -Acute.  Patient noted to have a low-grade fever 100.1 F and WBC elevated at 15.3 on admission. This could possibly be secondary to atelectasis she has been in the bed here last couple of days versus possibility of underlying infection from above vs UTI. -Incentive spirometry and flutter valve -WBC went from 15.3 -> 12.9 -> 11.2 -> 8.7 -> 8.3 -> 6.8.   -Follow-up ESR was 47 and CRP was 3.5 -Blood Culture x2 showed NGTD @ 5 Days ;  -Anaerobic/Aerobic Cx:  Gram Stain NO WBC SEEN NO ORGANISMS SEEN  Culture No growth aerobically or anaerobically. Performed at [facility], [address], [place], [place] [address]  -Aerobic Cx w/ Gram  Stain Negative @ 2 Days -Check CXR and U/A showed a cloudy appearance with small hemoglobin, large leukocytes, positive nitrites, rare bacteria, 11-20 RBCs per high-power field and 50 WBCs with urine culture not being obtained. -Chest x-ray done and showed No acute cardiopulmonary process but did show Stable ossific density along the shoulder. -CTM Cx and Temperature Curve   Hypokalemia: Mild and improved as K+ went from 3.1 -> 4.1 -> 3.9 -> 3.8 x2 -> 4.0. CTM and Replete as necessary. Mag Level is now 2.2. CTM and Replete as Necessary. Repeat CMP in the AM  ? Urinary Retention  Chronic Kidney Disease Stage IV / Metabolic Acidosis Patient noted to have some suprapubic tenderness on palpation on physical exam.  Creatinine noted to be 1.98 with BUN 20.  This appears to be around patient's baseline 1.6-2. BUN/Cr Trend fairly stable: Recent Labs  Lab 08/29/24 1141 08/29/24 1219 08/30/24 1416 08/31/24 0256 09/01/24 0543 09/02/24 0518 09/03/24 0452  BUN 20 22 23 23  27* 26* 25*  CREATININE 1.98* 2.10* 1.86* 1.82* 2.11* 2.25* 2.24*  -Check serial bladder scans for concern for urinary retention -Has a slight MA w/ a CO2 21, AG of 13, Chloride Level of 108 on last check -In-N-Out cath as needed/orders placed for Foley catheter persistent urinary retention -U/A as above -Avoid Nephrotoxic Medications, Contrast Dyes, Hypotension and Dehydration to Ensure Adequate Renal Perfusion and will need to Renally Adjust Meds. CTM and Trend Renal Function carefully and repeat CMP in the AM   Essential Hypertension: Blood pressures currently maintained. Continue Carvedilol  12.5 mg po BID. CTM BP per Protocol. Last BP reading was 128/57   COPD: No significant wheezes or rhonchi appreciated on physical exam at this time. -Breathing treatments as needed w/ Albuterol  2.5 mg Neb q4hprn Wheezing and SOB   Coronary Artery Disease Hyperlipidemia Patient with a history of STEMI.  No anginal symptoms  reported. -Held Clopidogrel  given need for Surgical Intervention and defer to NSGY when ok to Resume -Continue Beta-blocker w/ Carvedilol  12.5 mg po BID, Ezetimibe  10 mg po Daily, and Rosuvastatin  20 mg po Daily    Rheumatoid Arthritis:  Continue Hydroxychloroquine  200 mg po daily and Leflunomide  20 mg po daily    History of Breast Cancer: Noted  Normocytic Anemia: Hgb/Hct Trend stable and improved:  Recent Labs  Lab 08/29/24 1141 08/29/24 1219 08/30/24 1416 08/31/24 0256 09/01/24 0543 09/02/24 0518 09/03/24 0452  HGB 11.9* 11.6* 10.0* 9.1* 10.4* 9.7* 8.5*  HCT 37.5 34.0* 31.9* 28.9* 32.7* 31.4* 27.7*  MCV 94.2  --  96.4 96.7 95.9 96.3 97.2  -Checked Anemia Panel and showed an iron level 64, UIBC 125, TIBC 199, saturation ratios of 34, ferritin level 224, folate of 19.2 and a vitamin B12 of 527. CTM for S/Sx of Bleeding; No overt bleeding noted. Repeat CBC in the AM  Hypoalbuminemia: Patient's Albumin  Lvl went from 3.2 -> 2.7 -> 2.3 -> 2.8 -> 2.5 -> 2.1 on last check. CTM and Trend and repeat CMP in the AM  Class I Obesity: Complicates overall prognosis and care. Estimated body mass index is 36.62 kg/m as calculated from the following:   Height as of this encounter: 5' 1 (1.549 m).   Weight as of this encounter: 87.9 kg. Weight Loss and Dietary Counseling given

## 2024-08-30 NOTE — Anesthesia Preprocedure Evaluation (Addendum)
 Anesthesia Evaluation  Patient identified by MRN, date of birth, ID band Patient awake    Reviewed: Allergy & Precautions, NPO status , Patient's Chart, lab work & pertinent test results  History of Anesthesia Complications (+) PONV and history of anesthetic complications  Airway Mallampati: I  TM Distance: >3 FB     Dental  (+) Edentulous Upper, Edentulous Lower   Pulmonary COPD, former smoker   breath sounds clear to auscultation       Cardiovascular hypertension, Pt. on home beta blockers + CAD, + Past MI and + Cardiac Stents   Rhythm:Regular Rate:Normal  Echo:   1. No LV thrombus on contrast imaging. The mid to apical anterior  segments, apical septum, and apex are severely hypokinetic consistent with  likely prior infarction. Left ventricular ejection fraction, by  estimation, is 40 to 45%. The left ventricle has   mildly decreased function. The left ventricle demonstrates regional wall  motion abnormalities (see scoring diagram/findings for description). Left  ventricular diastolic parameters are consistent with Grade II diastolic  dysfunction (pseudonormalization).   Elevated left atrial pressure.   2. Right ventricular systolic function is normal. The right ventricular  size is normal. Tricuspid regurgitation signal is inadequate for assessing  PA pressure.   3. The mitral valve is grossly normal. Trivial mitral valve  regurgitation. No evidence of mitral stenosis.   4. The aortic valve was not well visualized. Aortic valve regurgitation  is not visualized. No aortic stenosis is present.   5. The inferior vena cava is normal in size with greater than 50%  respiratory variability, suggesting right atrial pressure of 3 mmHg.     Neuro/Psych  Neuromuscular disease  negative psych ROS   GI/Hepatic negative GI ROS, Neg liver ROS,,,  Endo/Other  negative endocrine ROS    Renal/GU Renal disease      Musculoskeletal  (+) Arthritis , Rheumatoid disorders,    Abdominal   Peds  Hematology  (+) Blood dyscrasia, anemia   Anesthesia Other Findings   Reproductive/Obstetrics                              Anesthesia Physical Anesthesia Plan  ASA: 3  Anesthesia Plan: General   Post-op Pain Management: Tylenol  PO (pre-op)* and Gabapentin  PO (pre-op)*   Induction: Intravenous  PONV Risk Score and Plan: 4 or greater and Ondansetron , Dexamethasone  and Treatment may vary due to age or medical condition  Airway Management Planned: Oral ETT  Additional Equipment: None  Intra-op Plan:   Post-operative Plan: Extubation in OR  Informed Consent: I have reviewed the patients History and Physical, chart, labs and discussed the procedure including the risks, benefits and alternatives for the proposed anesthesia with the patient or authorized representative who has indicated his/her understanding and acceptance.     Dental advisory given  Plan Discussed with: CRNA  Anesthesia Plan Comments:          Anesthesia Quick Evaluation

## 2024-08-30 NOTE — Plan of Care (Signed)
  Problem: Education: Goal: Knowledge of General Education information will improve Description: Including pain rating scale, medication(s)/side effects and non-pharmacologic comfort measures Outcome: Progressing   Problem: Health Behavior/Discharge Planning: Goal: Ability to manage health-related needs will improve Outcome: Progressing   Problem: Pain Managment: Goal: General experience of comfort will improve and/or be controlled Outcome: Progressing   Problem: Safety: Goal: Ability to remain free from injury will improve Outcome: Progressing

## 2024-08-30 NOTE — Transfer of Care (Signed)
 Immediate Anesthesia Transfer of Care Note  Patient: Samantha Mcbride  Procedure(s) Performed: LUMBAR LAMINECTOMY/DECOMPRESSION MICRODISCECTOMY 1 LEVEL (Back)  Patient Location: PACU  Anesthesia Type:General  Level of Consciousness: awake, alert , oriented, and patient cooperative  Airway & Oxygen Therapy: Patient Spontanous Breathing and Patient connected to face mask oxygen  Post-op Assessment: Report given to RN, Post -op Vital signs reviewed and stable, Patient moving all extremities, and Patient moving all extremities X 4  Post vital signs: Reviewed and stable  Last Vitals:  Vitals Value Taken Time  BP 171/90 08/30/24 10:34  Temp    Pulse 80 08/30/24 10:37  Resp 14 08/30/24 10:37  SpO2 100 % 08/30/24 10:37  Vitals shown include unfiled device data.  Last Pain:  Vitals:   08/30/24 0600  TempSrc: Oral  PainSc:          Complications: No notable events documented.

## 2024-08-30 NOTE — Anesthesia Postprocedure Evaluation (Signed)
 Anesthesia Post Note  Patient: Samantha Mcbride  Procedure(s) Performed: LUMBAR LAMINECTOMY/DECOMPRESSION MICRODISCECTOMY 1 LEVEL (Back)     Patient location during evaluation: PACU Anesthesia Type: General Level of consciousness: awake and alert Pain management: pain level controlled Vital Signs Assessment: post-procedure vital signs reviewed and stable Respiratory status: spontaneous breathing, nonlabored ventilation, respiratory function stable and patient connected to nasal cannula oxygen Cardiovascular status: blood pressure returned to baseline and stable Postop Assessment: no apparent nausea or vomiting Anesthetic complications: no   No notable events documented.  Last Vitals:  Vitals:   08/30/24 1125 08/30/24 1146  BP: (!) 154/78 (!) 143/67  Pulse: 73 71  Resp: 16 15  Temp:  36.7 C  SpO2: 97% 97%    Last Pain:  Vitals:   08/30/24 1146  TempSrc: Oral  PainSc:                  Sahir Tolson D Tinisha Etzkorn

## 2024-08-30 NOTE — Progress Notes (Signed)
 Patient ID: Samantha Mcbride, female   DOB: July 01, 1951, 73 y.o.   MRN: [id number] Saw her at 3:30 and she looked good with improved L DF strength

## 2024-08-30 NOTE — Progress Notes (Signed)
 Pharmacy Antibiotic Note  Samantha Mcbride is a 73 y.o. female admitted on 08/29/2024 with Severe lumbar spinal stenosis with grade 1 anterolisthesis L4-5 with epidural worsening fluid collection worrisome for epidural infectious process.   Now patient is s/p lumbar/laminectomy deipcompression  11/16 Pharmacy has been consulted for Vancomycin dosing for  questionable infectious epidural process.    Received Vancomycin 1g x1 given 11/16 preop @ 10:20 and Ceftriaxone  1g IV x1 preop on 11/16.   Scr is 2.1, she has h/o CKD stage IV.    Plan: Vancomycin 750 mg IV  x 1 (to total 1750mg  loading dose) then 750mg  every 48 hrs. Goal AUC 400-550. Expected AUC: 482.8, used Vd 0.5l/kg BMI 33), SCr used: 2.1 Ceftriaxone  increased to 2g q12h due for ? epidural infectious process. Monitor clinical progress, renal function, cultures and vanc levels per protocol. F/u SCr in AM, and adjust Vancomycin dose as needed.   Temp (24hrs), Avg:98.2 F (36.8 C), Min:96.5 F (35.8 C), Max:100.1 F (37.8 C)  Recent Labs  Lab 08/29/24 1141 08/29/24 1219  WBC 15.3*  --   CREATININE 1.98* 2.10*    CrCl cannot be calculated (Unknown ideal weight.).    No Known Allergies  Antimicrobials this admission: Vanc 11/16>> CRO 11/16>>   Dose adjustments this admission: N/a  Microbiology results: 11/16 Wound cx : sent 11/15 BCx x2: ngtd 11/15 BCx x2 : ngtd  11/15 resp panel pcr: negF  Thank you for allowing pharmacy to be a part of this patient's care.  Levorn Gaskins, RPh Clinical Pharmacist 08/30/2024 1:05 PM

## 2024-08-30 NOTE — Anesthesia Procedure Notes (Signed)
 Procedure Name: Intubation Date/Time: 08/30/2024 8:46 AM  Performed by: Arvell Edsel HERO, CRNAPre-anesthesia Checklist: Patient identified, Emergency Drugs available, Suction available, Patient being monitored and Timeout performed Patient Re-evaluated:Patient Re-evaluated prior to induction Oxygen Delivery Method: Circle system utilized Preoxygenation: Pre-oxygenation with 100% oxygen Induction Type: IV induction Ventilation: Mask ventilation without difficulty and Oral airway inserted - appropriate to patient size Laryngoscope Size: Glidescope and 3 Grade View: Grade I Tube type: Oral Tube size: 7.0 mm Number of attempts: 1 Airway Equipment and Method: Patient positioned with wedge pillow and Video-laryngoscopy Placement Confirmation: ETT inserted through vocal cords under direct vision, positive ETCO2 and breath sounds checked- equal and bilateral Secured at: 22 cm Tube secured with: Tape Dental Injury: Teeth and Oropharynx as per pre-operative assessment

## 2024-08-30 NOTE — Plan of Care (Signed)
  Problem: Clinical Measurements: Goal: Will remain free from infection Outcome: Progressing   Problem: Nutrition: Goal: Adequate nutrition will be maintained Outcome: Progressing   Problem: Coping: Goal: Level of anxiety will decrease Outcome: Progressing   Problem: Safety: Goal: Ability to remain free from injury will improve Outcome: Progressing   Problem: Skin Integrity: Goal: Risk for impaired skin integrity will decrease Outcome: Progressing

## 2024-08-30 NOTE — Progress Notes (Signed)
 Patient ID: Samantha Mcbride, female   DOB: 1950/12/15, 73 y.o.   MRN: [id number] I have talked to the patient and to her daughter and son-in-law at length.  After to answer all of their questions to the best of my ability.  We have talked about the diagnosis.  We talked about her steroid injections.  I feel fairly confident that this is subcutaneous abscesses in the muscle and epidural abscess and possible septic arthritis at L4-5.  She has early spondylolisthesis with severe spinal stenosis and the epidural abscess worsens the spinal stenosis.  She has back pain with left leg pain and a left foot drop.  I recommended a left L4-5 hemilaminectomy for evacuation of probable epidural abscess and decompression of the canal.  I have talked at length about the typical outcomes and recovery times.  We talked about the treatment of epidural infections like this with 6 weeks of IV antibiotics and 6 weeks of oral antibiotics at a minimum.  We have talked about following labs and updated MRIs and time.  Talked about the risks of the surgery and the potential benefits of the surgery.  They have demonstrated understanding and have answered all of their questions to the best my ability.  They seem satisfied.

## 2024-08-30 NOTE — Progress Notes (Signed)
 OT Cancellation Note  Patient Details Name: Samantha Mcbride MRN: [id number] DOB: [birth year]   Cancelled Treatment:    Reason Eval/Treat Not Completed: Patient at procedure or test/ unavailable (Pt in PACU with plans for hemilaminectomy this morning. Will follow up as pt medically appropriate/available and OT schedule allows.)  [name], OTR/L [facility] Acute Rehabilitation Office: [phone]   [name] 08/30/2024, 8:12 AM

## 2024-08-30 NOTE — Progress Notes (Signed)
 PT Cancellation Note  Patient Details Name: Samantha Mcbride MRN: [id number] DOB: 02/21/1951   Cancelled Treatment:    Reason Eval/Treat Not Completed: Patient at procedure or test/unavailable.  Patient for surgery today.  Will continue post-op as appropriate.  Thank you,   Cheynne Virden M 08/30/2024, 8:34 AM

## 2024-08-30 NOTE — Op Note (Signed)
 08/30/2024  10:30 AM  PATIENT:  Samantha Mcbride  73 y.o. female  PRE-OPERATIVE DIAGNOSIS: Severe lumbar spinal stenosis with grade 1 anterolisthesis L4-5 with epidural worsening fluid collection worrisome for epidural infectious process, back pain with radiculopathy and acute left foot drop  POST-OPERATIVE DIAGNOSIS:  same  PROCEDURE: Left L4-5 hemilaminectomy, medial facetectomy foraminotomies with sublaminar decompression for central and right lateral recess decompression  SURGEON:  Alm Molt, MD  ASSISTANTS: Suzen Pean, FNP  ANESTHESIA:   General  EBL: 25 ml  Total I/O In: 100 [IV Piggyback:100] Out: -   BLOOD ADMINISTERED: none  DRAINS: none  SPECIMEN: Intra operative cultures  Findings at operation: We did not find evidence of abscess in the muscle or in the epidural space with no evidence of purulence.  We did find thickened tissue like synovium or old hematoma or old infection attached to the dura especially in the midline under the spinous process as expected.  We did find very severe spinal stenosis and neural compression.  We did culture the epidural space and sent tissue for culture and pathology  INDICATION FOR PROCEDURE: This patient presented with severe back pain with a left more than right leg pain with an acute left foot drop and inability to walk.. Imaging showed severe spinal stenosis at L4-5 with a slight anterolisthesis at this level with new fluid collection in the midline between the yellow ligament compared to her MRI just 2 weeks ago.  There were 4 small fluid collections in the muscle they could have been abscess or dissecting synovial cyst.  Her sed rate and CRP were slightly high but she does have a UTI.  She has a high white count.  Given all of this we were concerned about epidural abscess and therefore recommended decompressive laminectomy.. The patient tried conservative measures without relief. Pain was debilitating. Recommended decompressive  laminectomy L4-5. Patient understood the risks, benefits, and alternatives and potential outcomes and wished to proceed.  PROCEDURE DETAILS: The patient was taken to the operating room and after induction of adequate generalized endotracheal anesthesia, the patient was rolled into the prone position on the Wilson frame and all pressure points were padded. The lumbar region was cleaned and then prepped with DuraPrep and draped in the usual sterile fashion. 5 cc of local anesthesia was injected and then a dorsal midline incision was made and carried down to the lumbo sacral fascia. The fascia was opened and the paraspinous musculature was taken down in a subperiosteal fashion to expose L4-5 on the left. Intraoperative x-ray confirmed my level, and then I used a combination of the high-speed drill and the Kerrison punches to perform a hemilaminectomy, medial facetectomy, and foraminotomy at L4-5 on the left. The underlying yellow ligament was opened and removed in a piecemeal fashion to expose the underlying dura and exiting nerve root. I undercut the lateral recess and dissected down until I was medial to and distal to the pedicle. The nerve root was well decompressed. We then gently retracted the nerve root medially with a retractor, coagulated the epidural venous vasculature, and inspected the disc space.  We found no evidence of epidural abscess in this area.  I then palpated with a coronary dilator along the nerve root and into the foramen to assure adequate decompression.  I then drilled the lateral part of the spinous process and up under the spinous process.  We performed a sublaminar decompression.  There was a cystic structure in the midline but did not appear to be  infectious.  It could have been old hematoma or old synovial cyst.  As it was quite adherent to the dura.  We peeled it away and removed it and sent it for pathology.  I felt no more compression of the nerve root. I irrigated with saline  solution. Achieved hemostasis with bipolar cautery, lined the dura with Gelfoam, and then closed the fascia with 0 Vicryl. I closed the subcutaneous tissues with 2-0 Vicryl and the subcuticular tissues with 3-0 Vicryl. The skin was then closed with benzoin and Steri-Strips. The drapes were removed, a sterile dressing was applied.  My nurse practitioner was involved in the exposure, safe retraction of the neural elements, and the closure. the patient was awakened from general anesthesia and transferred to the recovery room in stable condition. At the end of the procedure all sponge, needle and instrument counts were correct.    PLAN OF CARE: Admit to inpatient   PATIENT DISPOSITION:  PACU - hemodynamically stable.   Delay start of Pharmacological VTE agent (>24hrs) due to surgical blood loss or risk of bleeding:  yes

## 2024-08-30 NOTE — Progress Notes (Signed)
 PROGRESS NOTE    Samantha Mcbride  FMW:[id number] DOB: Oct 19, 1950 DOA: 08/29/2024 PCP: Seabron Lenis, MD   Brief Narrative:  [name] 73 year old female who presented for worsening back pain and left leg weakness with reports of inability to ambulate.  Found to have concern for grade 1 anterolisthesis at L4-5 with severe spinal stenosis most recent imaging study for which neurosurgery admitted the patient and plan on possible intervention.  Rechecking MRI at this time and it was done without contrast but was reviewed by neurosurgery who felt that she has small abscesses within the muscle at L4-L5 and probable septic arthritis with epidural abscess with severe spinal stenosis.  Neurosurgery recommended L4-L5 hemilaminectomy and she underwent this this a.m along with a medial facetectomy foraminotomies with sublaminar decompression for central and right lateral recess decompression.   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  Patient presents with worsening back pain and left foot drop.  Recent imaging showing grade 1 anterolisthesis at L4-5 with severe spinal stenosis.  Currently given Decadron  10 mg IV.   -MRI lumbar spine without contrast done and showed New 19 x 7 mm fluid collection posterior to the thecal sac at L4-L5, unlikely a synovial cyst since it was not present on the prior recent MRI, it could be a liquified hematoma or abscess but is certainly contributing to the patient's tight stenosis; Notably there was also Severe spinal canal and bilateral lateral recess stenosis at L4-L5 due to diffuse disc bulge, advanced facet arthropathy, and ligamentum flavum thickening and Stable posterior cystic structures at L5-S1, likely dissecting synovial cysts. -Neurosurgery evaluated the patient for surgical intervention and she is status post left L4-L5 hemilaminectomy for evacuation of  probable epidural abscess and decompression of the canal.  She will continue to follow on antibiotic course and will consult ID pending on results. -Follow up on Aerobic and Aerobic/Anaerobic Cx and both pending  -Continue antibiotics with IV Ceftriaxone  2 g every 12 and IV Vancomycin 750 mg every 48 hours -Pain control with Acetaminophen  1000 mg p.o. Q6h scheduled, Hydrocodone -Acetaminophen  7.5-325 mg 1 tablet p.o. every 6 scheduled, and IV morphine  2 mg Q2 as needed for severe pain: Continue with supportive care with ondansetron  4 mg p.o./IV every 6 as needed nausea vomiting and bowel regimen senna 8.6 mg p.o. twice daily -Also continue with Tizanidine 2 mg p.o. every 8 as needed for muscle spasms -Will need PT/OT to Evaluate and Treat    Leukocytosis and Low-grade fever Acute.  Patient noted to have a low-grade fever 100.1 F and WBC elevated at 15.3. This could possibly be secondary to atelectasis she has been in the bed here last couple of days versus possibility of underlying infection. -Incentive spirometry and flutter valve -WBC went from 15.3 -> 12.9.   -Follow-up ESR was 47 and CRP was 3.5 -Blood Culture x2 showed NGTD < 24 hours - Check chest x-ray and U/A showed a cloudy appearance with small hemoglobin, large leukocytes, positive nitrites, rare bacteria, 11-20 RBCs per high-power field and 50 WBCs with urine culture not being obtained. -Chest x-ray done and showed No acute cardiopulmonary process but did show Stable ossific density along the shoulder.   Hypokalemia: Mild and improved as K+ went from 3.1 -> 4.1. CTM and Replete as necessary. Mag Level was 2.4. CTM and Replete as Necessary. Repeat CMP in the AM  Question urinary retention  Chronic kidney disease stage IV  Patient noted to have some suprapubic tenderness on palpation on physical exam.  Creatinine noted to be 1.98 with BUN 20.  This appears to be around patient's baseline 1.6-2. BUN/Cr Trend: Recent Labs  Lab  08/29/24 1141 08/29/24 1219 08/30/24 1416  BUN 20 22 23   CREATININE 1.98* 2.10* 1.86*  - Check serial bladder scans for concern for urinary retention - In-N-Out cath as needed/orders placed for Foley catheter persistent urinary retention -U/A as above -Avoid Nephrotoxic Medications, Contrast Dyes, Hypotension and Dehydration to Ensure Adequate Renal Perfusion and will need to Renally Adjust Meds -Continue to Monitor and Trend Renal Function carefully and repeat CMP in the AM   Essential Hypertension: Blood pressures currently maintained. Continue Carvedilol  12.5 mg po BID. CTM BP per Protocol. Last BP reading was 143/67   COPD: No significant wheezes or rhonchi appreciated on physical exam at this time. - reathing treatments as needed w/ Albuterol 2.5 mg Neb q4hprn Wheezing and SOB   Coronary artery disease Hyperlipidemia Patient with a history of STEMI.  No anginal symptoms reported. -Hold Clopidogrel  given need for Surgical Intervention -Continue beta-blocker w/ Carvedilol  12.5 mg po BID, Ezetimibe  10 mg po Daily, and Rosuvastatin  20 mg po Daily    Rheumatoid Arthritis:  Continue Hydroxychloroquine  200 mg po daily and Leflunomide  20 mg po daily    History of Breast Cancer: Noted  Normocytic Anemia: Hgb/Hct Trend:  Recent Labs  Lab 08/29/24 1141 08/29/24 1219 08/30/24 1416  HGB 11.9* 11.6* 10.0*  HCT 37.5 34.0* 31.9*  MCV 94.2  --  96.4  -Check Anemia Panel in the AM. CTM for S/Sx of Bleeding; No overt bleeding noted. Repeat CBC in the AM  Hypoalbuminemia: Patient's Albumin  Lvl went from 3.2 -> 2.7. CTM and Trend and repeat CMP in the AM  Class I Obesity: Complicates overall prognosis and care. Estimated body mass index is 33.07 kg/m as calculated from the following:   Height as of 08/04/24: 5' 1 (1.549 m).   Weight as of 08/04/24: 79.4 kg. Weight Loss and Dietary Counseling given   DVT prophylaxis: SCD's Start: 08/30/24 1148    Code Status: Full Code Family  Communication: No family present @ bedside  Disposition Plan:  Level of care: Med-Surg Status is: Inpatient Remains inpatient appropriate because: Needs further clinical Improvement and clearance by the Primary team. Will need PT/OT to evaluate and Treat   Consultants:  [facility] Neurosurgery is Primary   Procedures:  PROCEDURE by Dr. Alm Molt: Left L4-5 hemilaminectomy, medial facetectomy foraminotomies with sublaminar decompression for central and right lateral recess decompression   Antimicrobials:  Anti-infectives (From admission, onward)    Start     Dose/Rate Route Frequency Ordered Stop   09/01/24 1000  vancomycin (VANCOREADY) IVPB 750 mg/150 mL        750 mg 150 mL/hr over 60 Minutes Intravenous Every 48 hours 08/30/24 1258     08/30/24 1345  vancomycin (VANCOREADY) IVPB 750 mg/150 mL        750 mg 150 mL/hr over 60 Minutes Intravenous STAT 08/30/24 1258 08/30/24 1448   08/30/24 1245  cefTRIAXone  (ROCEPHIN ) 2 g in sodium chloride  0.9 % 100 mL IVPB        2 g 200 mL/hr over 30 Minutes Intravenous Every 12 hours 08/30/24 1147     08/30/24 0915  vancomycin (VANCOCIN) IVPB 1000 mg/200 mL premix        1,000 mg 200 mL/hr over 60 Minutes Intravenous  Once 08/30/24 0909 08/30/24 1120   08/30/24  0915  cefTRIAXone  (ROCEPHIN ) 1 g in sodium chloride  0.9 % 100 mL IVPB        1 g 200 mL/hr over 30 Minutes Intravenous  Once 08/30/24 0909 08/30/24 1028   08/29/24 2015  hydroxychloroquine  (PLAQUENIL ) tablet 200 mg        200 mg Oral Daily 08/29/24 1920         Subjective: Seen and examined at bedside and just come back from the OR and was doing okay.  States her pain was pretty well-controlled with the 3 doses of fentanyl  that she got.  No nausea or vomiting.  Feels okay.  No other concerns or complaints at this time.  Objective: Vitals:   08/30/24 1100 08/30/24 1115 08/30/24 1125 08/30/24 1146  BP: 137/76 (!) 155/78 (!) 154/78 (!) 143/67  Pulse: 79 72 73 71  Resp: (!) 22 15 16 15    Temp: 97.9 F (36.6 C)   98 F (36.7 C)  TempSrc:    Oral  SpO2: 92% 94% 97% 97%    Intake/Output Summary (Last 24 hours) at 08/30/2024 1655 Last data filed at 08/30/2024 1500 Gross per 24 hour  Intake 2255.46 ml  Output 170 ml  Net 2085.46 ml   There were no vitals filed for this visit.  Examination: Physical Exam:  Constitutional: WN/WD, obese chronically ill-appearing Caucasian female no acute distress Respiratory: Slightly diminished to auscultation bilaterally, no wheezing, rales, rhonchi or crackles. Normal respiratory effort and patient is not tachypenic. No accessory muscle use.  Unlabored breathing Cardiovascular: RRR, no murmurs / rubs / gallops. S1 and S2 auscultated.  Minimal extremity edema Abdomen: Soft, non-tender, distended secondary to body habitus.  Bowel sounds positive.  GU: Deferred. Musculoskeletal: No clubbing / cyanosis of digits/nails. No joint deformity upper and lower extremities.  Skin: No rashes, lesions, ulcers on limited skin evaluation. No induration; Warm and dry.  Neurologic: CN 2-12 grossly intact with no focal deficits. Romberg sign and cerebellar reflexes not assessed.  Psychiatric: Normal judgment and insight. Alert and oriented x 3. Normal mood and appropriate affect.   Data Reviewed: I have personally reviewed following labs and imaging studies  CBC: Recent Labs  Lab 08/29/24 1141 08/29/24 1219 08/30/24 1416  WBC 15.3*  --  12.9*  NEUTROABS 13.3*  --  11.8*  HGB 11.9* 11.6* 10.0*  HCT 37.5 34.0* 31.9*  MCV 94.2  --  96.4  PLT 180  --  180   Basic Metabolic Panel: Recent Labs  Lab 08/29/24 1141 08/29/24 1219 08/30/24 1416  NA 141 138 141  K 3.2* 3.1* 4.1  CL 105 109 109  CO2 23  --  21*  GLUCOSE 106* 103* 154*  BUN 20 22 23   CREATININE 1.98* 2.10* 1.86*  CALCIUM  9.2  --  8.3*  MG  --   --  2.4  PHOS  --   --  2.6   GFR: CrCl cannot be calculated (Unknown ideal weight.). Liver Function Tests: Recent Labs  Lab  08/29/24 1141 08/30/24 1416  AST 15 16  ALT 10 10  ALKPHOS 56 48  BILITOT 0.3 0.5  PROT 6.2* 5.5*  ALBUMIN  3.2* 2.7*   No results for input(s): LIPASE, AMYLASE in the last 168 hours. No results for input(s): AMMONIA in the last 168 hours. Coagulation Profile: No results for input(s): INR, PROTIME in the last 168 hours. Cardiac Enzymes: No results for input(s): CKTOTAL, CKMB, CKMBINDEX, TROPONINI in the last 168 hours. BNP (last 3 results) No results for input(s): PROBNP  in the last 8760 hours. HbA1C: No results for input(s): HGBA1C in the last 72 hours. CBG: No results for input(s): GLUCAP in the last 168 hours. Lipid Profile: No results for input(s): CHOL, HDL, LDLCALC, TRIG, CHOLHDL, LDLDIRECT in the last 72 hours. Thyroid  Function Tests: No results for input(s): TSH, T4TOTAL, FREET4, T3FREE, THYROIDAB in the last 72 hours. Anemia Panel: No results for input(s): VITAMINB12, FOLATE, FERRITIN, TIBC, IRON, RETICCTPCT in the last 72 hours. Sepsis Labs: No results for input(s): PROCALCITON, LATICACIDVEN in the last 168 hours.  Recent Results (from the past 240 hours)  Resp panel by RT-PCR (RSV, Flu A&B, Covid) Anterior Nasal Swab     Status: None   Collection Time: 08/29/24  3:32 PM   Specimen: Anterior Nasal Swab  Result Value Ref Range Status   SARS Coronavirus 2 by RT PCR NEGATIVE NEGATIVE Final   Influenza A by PCR NEGATIVE NEGATIVE Final   Influenza B by PCR NEGATIVE NEGATIVE Final    Comment: (NOTE) The Xpert Xpress SARS-CoV-2/FLU/RSV plus assay is intended as an aid in the diagnosis of influenza from Nasopharyngeal swab specimens and should not be used as a sole basis for treatment. Nasal washings and aspirates are unacceptable for Xpert Xpress SARS-CoV-2/FLU/RSV testing.  Fact Sheet for Patients: bloggercourse.com  Fact Sheet for Healthcare  Providers: seriousbroker.it  This test is not yet approved or cleared by the [place]  FDA and has been authorized for detection and/or diagnosis of SARS-CoV-2 by FDA under an Emergency Use Authorization (EUA). This EUA will remain in effect (meaning this test can be used) for the duration of the COVID-19 declaration under Section 564(b)(1) of the Act, 21 U.S.C. section 360bbb-3(b)(1), unless the authorization is terminated or revoked.     Resp Syncytial Virus by PCR NEGATIVE NEGATIVE Final    Comment: (NOTE) Fact Sheet for Patients: bloggercourse.com  Fact Sheet for Healthcare Providers: seriousbroker.it  This test is not yet approved or cleared by the [place]  FDA and has been authorized for detection and/or diagnosis of SARS-CoV-2 by FDA under an Emergency Use Authorization (EUA). This EUA will remain in effect (meaning this test can be used) for the duration of the COVID-19 declaration under Section 564(b)(1) of the Act, 21 U.S.C. section 360bbb-3(b)(1), unless the authorization is terminated or revoked.  Performed at [facility], [address] [place], [place] [address]   Culture, blood (Routine X 2) w Reflex to ID Panel     Status: None (Preliminary result)   Collection Time: 08/29/24  6:48 PM   Specimen: BLOOD LEFT HAND  Result Value Ref Range Status   Specimen Description BLOOD LEFT HAND  Final   Special Requests   Final    BOTTLES DRAWN AEROBIC AND ANAEROBIC Blood Culture results may not be optimal due to an inadequate volume of blood received in culture bottles   Culture   Final    NO GROWTH < 24 HOURS Performed at [facility], [address] [place], [place] [address]    Report Status PENDING  Incomplete  Culture, blood (Routine X 2) w Reflex to ID Panel     Status: None (Preliminary result)   Collection Time: 08/29/24  6:48 PM   Specimen: BLOOD  Result Value  Ref Range Status   Specimen Description BLOOD  Final   Special Requests   Final    AEROBIC BOTTLE ONLY Blood Culture results may not be optimal due to an inadequate volume of blood received in culture bottles  Culture   Final    NO GROWTH < 24 HOURS Performed at [facility], [address] [place], [place] [address]    Report Status PENDING  Incomplete  Surgical PCR screen     Status: None   Collection Time: 08/29/24 10:55 PM   Specimen: Nasal Mucosa; Nasal Swab  Result Value Ref Range Status   MRSA, PCR NEGATIVE NEGATIVE Final   Staphylococcus aureus NEGATIVE NEGATIVE Final    Comment: (NOTE) The Xpert SA Assay (FDA approved for NASAL specimens in patients 75 years of age and older), is one component of a comprehensive surveillance program. It is not intended to diagnose infection nor to guide or monitor treatment. Performed at [facility], [address] [place], [place] [address]     Radiology Studies: DG Lumbar Spine 1 View Result Date: 08/30/2024 CLINICAL DATA:  Lumbar laminectomy and decompression with micro discectomy. EXAM: LUMBAR SPINE - 1 VIEW COMPARISON:  Lumbar MRI 08/29/2024.  Lumbar CT 08/04/2024. FINDINGS: Three cross-table lateral views of the lumbar spine are submitted from the operating room. On the initial image at 0914 hours, there is a localizing needle posteriorly between the L3 and L4 spinous processes. On the 2nd view, there is a needle more inferiorly overlying the inferior aspect of the L4 spinous process. The final view at 0935 hours demonstrates skin spreaders and surgical instruments posteriorly at L4-5. IMPRESSION: Intraoperative localization of the L4-5 level. Electronically Signed   By: Elsie Perone M.D.   On: 08/30/2024 15:21   MR LUMBAR SPINE WO CONTRAST Result Date: 08/29/2024 EXAM: MRI LUMBAR SPINE 08/29/2024 07:50:15 PM TECHNIQUE: Multiplanar multisequence MRI of the lumbar spine was performed without the administration of  intravenous contrast. COMPARISON: Comparison study 08/18/2004. CLINICAL HISTORY: back pain, fever, recent spine injection FINDINGS: BONES AND ALIGNMENT: Normal and stable alignment of the lumbar vertebral bodies. Normal vertebral body heights. Bone marrow signal is unremarkable. No bone lesions or fractures. No findings suspicious for discitis or osteomyelitis. SPINAL CORD: The conus medullaris terminates at L1-2. SOFT TISSUES: No significant paraspinal or paravertebral abnormalities retroperitoneal findings. T12-L1: No significant findings. L1-L2: No significant findings. L2-L3: No significant findings. L3-L4: No significant findings. L4-L5: Persistent diffuse bulging annulus in conjunction with advanced facet disease and ligamentum flavum thickening contributing to severe spinal and bilateral lateral recess stenosis. There is a new small fluid collection in the spinal canal posterior to the thecal sac, in between the ligamentum flavum bilaterally. Although this could represent a synovial cyst it was not there on the earlier study from 11/04. It could be related to the injection and be a small liquefied hematoma, or it could be a small abscess. It measures 19 x7 mm. L5-S1: Stable multiple cystic structures posteriorly likely dissecting synovial cysts. Small synovial cysts are noted bilaterally just above the disc space level and appeared to be relatively stable. No disc protrusions, canal or foraminal stenosis at this level. IMPRESSION: 1. New 19 x 7 mm fluid collection posterior to the thecal sac at L4-L5, unlikely a synovial cyst since it was not present on the prior recent MRI, it could be a liquified hematoma or abscess but is certainly contributing to the patient's tight stenosis. 2. Severe spinal canal and bilateral lateral recess stenosis at L4-L5 due to diffuse disc bulge, advanced facet arthropathy, and ligamentum flavum thickening. 3. Stable posterior cystic structures at L5-S1, likely dissecting synovial  cysts. 4. Call report initiated. Electronically signed by: Maude Stammer MD 08/29/2024 08:26 PM EST RP Workstation:  [id number]   DG CHEST PORT 1 VIEW Result Date: 08/29/2024 EXAM: 1 VIEW(S) XRAY OF THE CHEST 08/29/2024 07:16:00 PM COMPARISON: Chest x-ray 08/04/2024. CLINICAL HISTORY: [id number] Low grade fever [id number] Low grade fever. FINDINGS: LUNGS AND PLEURA: No focal pulmonary opacity. No pleural effusion. No pneumothorax. HEART AND MEDIASTINUM: Atherosclerotic plaque. Similar silhouetting of the left ventricular apex due to known overlying pericardial fat. BONES AND SOFT TISSUES: 2.2 cm ossific density along the shoulder again noted. IMPRESSION: 1. No acute cardiopulmonary process. 2. Stable ossific density along the shoulder. Electronically signed by: Morgane Naveau MD 08/29/2024 07:30 PM EST RP Workstation: [id number]   Scheduled Meds:  acetaminophen   1,000 mg Oral Q6H   carvedilol   12.5 mg Oral BID WC   ezetimibe   10 mg Oral Daily   gabapentin   300 mg Oral QPM   HYDROcodone -acetaminophen   1 tablet Oral Q6H   hydroxychloroquine   200 mg Oral Daily   leflunomide   20 mg Oral Daily   melatonin  5 mg Oral QPM   multivitamin with minerals  1 tablet Oral Daily   rosuvastatin   20 mg Oral Daily   senna  1 tablet Oral BID   sodium chloride  flush  3 mL Intravenous Q12H   traZODone   100 mg Oral QHS   Continuous Infusions:  sodium chloride      0.9 % NaCl with KCl 20 mEq / L     cefTRIAXone  (ROCEPHIN )  IV 2 g (08/30/24 1357)   [START ON 09/01/2024] vancomycin      LOS: 1 day   Alejandro Marker, DO [facility] Available via Epic secure chat 7am-7pm After these hours, please refer to coverage provider listed on [email or web address] 08/30/2024, 4:55 PM

## 2024-08-31 ENCOUNTER — Encounter (HOSPITAL_COMMUNITY): Payer: Self-pay | Admitting: Neurological Surgery

## 2024-08-31 ENCOUNTER — Telehealth: Payer: Self-pay | Admitting: Podiatry

## 2024-08-31 DIAGNOSIS — D72829 Elevated white blood cell count, unspecified: Secondary | ICD-10-CM | POA: Diagnosis not present

## 2024-08-31 DIAGNOSIS — R509 Fever, unspecified: Secondary | ICD-10-CM | POA: Diagnosis not present

## 2024-08-31 DIAGNOSIS — M4316 Spondylolisthesis, lumbar region: Secondary | ICD-10-CM | POA: Diagnosis not present

## 2024-08-31 DIAGNOSIS — E876 Hypokalemia: Secondary | ICD-10-CM | POA: Diagnosis not present

## 2024-08-31 LAB — CBC WITH DIFFERENTIAL/PLATELET
Abs Immature Granulocytes: 0.09 K/uL — ABNORMAL HIGH (ref 0.00–0.07)
Basophils Absolute: 0 K/uL (ref 0.0–0.1)
Basophils Relative: 0 %
Eosinophils Absolute: 0 K/uL (ref 0.0–0.5)
Eosinophils Relative: 0 %
HCT: 28.9 % — ABNORMAL LOW (ref 36.0–46.0)
Hemoglobin: 9.1 g/dL — ABNORMAL LOW (ref 12.0–15.0)
Immature Granulocytes: 1 %
Lymphocytes Relative: 6 %
Lymphs Abs: 0.7 K/uL (ref 0.7–4.0)
MCH: 30.4 pg (ref 26.0–34.0)
MCHC: 31.5 g/dL (ref 30.0–36.0)
MCV: 96.7 fL (ref 80.0–100.0)
Monocytes Absolute: 0.5 K/uL (ref 0.1–1.0)
Monocytes Relative: 5 %
Neutro Abs: 9.8 K/uL — ABNORMAL HIGH (ref 1.7–7.7)
Neutrophils Relative %: 88 %
Platelets: 180 K/uL (ref 150–400)
RBC: 2.99 MIL/uL — ABNORMAL LOW (ref 3.87–5.11)
RDW: 13.4 % (ref 11.5–15.5)
WBC: 11.2 K/uL — ABNORMAL HIGH (ref 4.0–10.5)
nRBC: 0 % (ref 0.0–0.2)

## 2024-08-31 LAB — COMPREHENSIVE METABOLIC PANEL WITH GFR
ALT: 9 U/L (ref 0–44)
AST: 14 U/L — ABNORMAL LOW (ref 15–41)
Albumin: 2.3 g/dL — ABNORMAL LOW (ref 3.5–5.0)
Alkaline Phosphatase: 45 U/L (ref 38–126)
Anion gap: 11 (ref 5–15)
BUN: 23 mg/dL (ref 8–23)
CO2: 22 mmol/L (ref 22–32)
Calcium: 8.1 mg/dL — ABNORMAL LOW (ref 8.9–10.3)
Chloride: 108 mmol/L (ref 98–111)
Creatinine, Ser: 1.82 mg/dL — ABNORMAL HIGH (ref 0.44–1.00)
GFR, Estimated: 29 mL/min — ABNORMAL LOW (ref >60)
Glucose, Bld: 185 mg/dL — ABNORMAL HIGH (ref 70–99)
Potassium: 3.9 mmol/L (ref 3.5–5.1)
Sodium: 141 mmol/L (ref 135–145)
Total Bilirubin: 0.3 mg/dL (ref 0.0–1.2)
Total Protein: 4.9 g/dL — ABNORMAL LOW (ref 6.5–8.1)

## 2024-08-31 LAB — PHOSPHORUS: Phosphorus: 2.9 mg/dL (ref 2.5–4.6)

## 2024-08-31 LAB — MAGNESIUM: Magnesium: 2.2 mg/dL (ref 1.7–2.4)

## 2024-08-31 MED ORDER — SODIUM CHLORIDE 0.9 % IV SOLN
2.0000 g | INTRAVENOUS | Status: DC
Start: 2024-09-01 — End: 2024-09-03
  Administered 2024-09-01 – 2024-09-02 (×2): 2 g via INTRAVENOUS
  Filled 2024-08-31 (×2): qty 20

## 2024-08-31 MED FILL — Thrombin For Soln 5000 Unit: CUTANEOUS | Qty: 5000 | Status: AC

## 2024-08-31 NOTE — Progress Notes (Signed)
 Subjective: Patient reports doing well, no pain in her leg when she stands anymore. She is very pleased   Objective: Vital signs in last 24 hours: Temp:  [96.5 F (35.8 C)-98.9 F (37.2 C)] 97.6 F (36.4 C) (11/17 0552) Pulse Rate:  [66-82] 66 (11/17 0552) Resp:  [15-22] 16 (11/17 0552) BP: (107-171)/(54-97) 122/62 (11/17 0552) SpO2:  [92 %-99 %] 93 % (11/17 0552)  Intake/Output from previous day: 11/16 0701 - 11/17 0700 In: 1890 [P.O.:240; I.V.:1000; IV Piggyback:650] Out: 250 [Urine:250] Intake/Output this shift: No intake/output data recorded.  Neuro: patients left foot drop is improving a little, she is probably 3/5 now compared to 0/5  Lab Results: Lab Results  Component Value Date   WBC 11.2 (H) 08/31/2024   HGB 9.1 (L) 08/31/2024   HCT 28.9 (L) 08/31/2024   MCV 96.7 08/31/2024   PLT 180 08/31/2024   Lab Results  Component Value Date   INR 1.0 10/17/2022   BMET Lab Results  Component Value Date   NA 141 08/31/2024   K 3.9 08/31/2024   CL 108 08/31/2024   CO2 22 08/31/2024   GLUCOSE 185 (H) 08/31/2024   BUN 23 08/31/2024   CREATININE 1.82 (H) 08/31/2024   CALCIUM  8.1 (L) 08/31/2024    Studies/Results: DG Lumbar Spine 1 View Result Date: 08/30/2024 CLINICAL DATA:  Lumbar laminectomy and decompression with micro discectomy. EXAM: LUMBAR SPINE - 1 VIEW COMPARISON:  Lumbar MRI 08/29/2024.  Lumbar CT 08/04/2024. FINDINGS: Three cross-table lateral views of the lumbar spine are submitted from the operating room. On the initial image at 0914 hours, there is a localizing needle posteriorly between the L3 and L4 spinous processes. On the 2nd view, there is a needle more inferiorly overlying the inferior aspect of the L4 spinous process. The final view at 0935 hours demonstrates skin spreaders and surgical instruments posteriorly at L4-5. IMPRESSION: Intraoperative localization of the L4-5 level. Electronically Signed   By: Elsie Perone M.D.   On: 08/30/2024 15:21    MR LUMBAR SPINE WO CONTRAST Result Date: 08/29/2024 EXAM: MRI LUMBAR SPINE 08/29/2024 07:50:15 PM TECHNIQUE: Multiplanar multisequence MRI of the lumbar spine was performed without the administration of intravenous contrast. COMPARISON: Comparison study 08/18/2004. CLINICAL HISTORY: back pain, fever, recent spine injection FINDINGS: BONES AND ALIGNMENT: Normal and stable alignment of the lumbar vertebral bodies. Normal vertebral body heights. Bone marrow signal is unremarkable. No bone lesions or fractures. No findings suspicious for discitis or osteomyelitis. SPINAL CORD: The conus medullaris terminates at L1-2. SOFT TISSUES: No significant paraspinal or paravertebral abnormalities retroperitoneal findings. T12-L1: No significant findings. L1-L2: No significant findings. L2-L3: No significant findings. L3-L4: No significant findings. L4-L5: Persistent diffuse bulging annulus in conjunction with advanced facet disease and ligamentum flavum thickening contributing to severe spinal and bilateral lateral recess stenosis. There is a new small fluid collection in the spinal canal posterior to the thecal sac, in between the ligamentum flavum bilaterally. Although this could represent a synovial cyst it was not there on the earlier study from 11/04. It could be related to the injection and be a small liquefied hematoma, or it could be a small abscess. It measures 19 x7 mm. L5-S1: Stable multiple cystic structures posteriorly likely dissecting synovial cysts. Small synovial cysts are noted bilaterally just above the disc space level and appeared to be relatively stable. No disc protrusions, canal or foraminal stenosis at this level. IMPRESSION: 1. New 19 x 7 mm fluid collection posterior to the thecal sac at L4-L5, unlikely a  synovial cyst since it was not present on the prior recent MRI, it could be a liquified hematoma or abscess but is certainly contributing to the patient's tight stenosis. 2. Severe spinal canal  and bilateral lateral recess stenosis at L4-L5 due to diffuse disc bulge, advanced facet arthropathy, and ligamentum flavum thickening. 3. Stable posterior cystic structures at L5-S1, likely dissecting synovial cysts. 4. Call report initiated. Electronically signed by: Maude Stammer MD 08/29/2024 08:26 PM EST RP Workstation: [id number]   DG CHEST PORT 1 VIEW Result Date: 08/29/2024 EXAM: 1 VIEW(S) XRAY OF THE CHEST 08/29/2024 07:16:00 PM COMPARISON: Chest x-ray 08/04/2024. CLINICAL HISTORY: [id number] Low grade fever [id number] Low grade fever. FINDINGS: LUNGS AND PLEURA: No focal pulmonary opacity. No pleural effusion. No pneumothorax. HEART AND MEDIASTINUM: Atherosclerotic plaque. Similar silhouetting of the left ventricular apex due to known overlying pericardial fat. BONES AND SOFT TISSUES: 2.2 cm ossific density along the shoulder again noted. IMPRESSION: 1. No acute cardiopulmonary process. 2. Stable ossific density along the shoulder. Electronically signed by: Morgane Naveau MD 08/29/2024 07:30 PM EST RP Workstation: [id number]    Assessment/Plan: Postop day 1 laminectomy for foot drop which is improving. Continue to work with therapy and see how she does. We will get social workers to consult for rehab placement    LOS: 2 days    Suzen Lacks [facility] 08/31/2024, 7:35 AM

## 2024-08-31 NOTE — Progress Notes (Signed)
 PROGRESS NOTE    Samantha Mcbride  FMW:[id number] DOB: 12-29-1950 DOA: 08/29/2024 PCP: Seabron Lenis, MD   Brief Narrative:  Ms. Hauck 73 year old female who presented for worsening back pain and left leg weakness with reports of inability to ambulate.  Found to have concern for grade 1 anterolisthesis at L4-5 with severe spinal stenosis most recent imaging study for which neurosurgery admitted the patient and plan on possible intervention.  Rechecking MRI at this time and it was done without contrast but was reviewed by neurosurgery who felt that she has small abscesses within the muscle at L4-L5 and probable septic arthritis with epidural abscess with severe spinal stenosis.  Neurosurgery recommended L4-L5 hemilaminectomy and she underwent this along with a medial facetectomy foraminotomies with sublaminar decompression for central and right lateral recess decompression and is POD1.  PT/OT recommendign SNF.    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  Patient presents with worsening back pain and left foot drop.  Recent imaging showing grade 1 anterolisthesis at L4-5 with severe spinal stenosis.  Currently given Decadron  10 mg IV.   -MRI lumbar spine without contrast done and showed New 19 x 7 mm fluid collection posterior to the thecal sac at L4-L5, unlikely a synovial cyst since it was not present on the prior recent MRI, it could be a liquified hematoma or abscess but is certainly contributing to the patient's tight stenosis; Notably there was also Severe spinal canal and bilateral lateral recess stenosis at L4-L5 due to diffuse disc bulge, advanced facet arthropathy, and ligamentum flavum thickening and Stable posterior cystic structures at L5-S1, likely dissecting synovial cysts. -Neurosurgery evaluated the patient for surgical intervention and she is status post left L4-L5  hemilaminectomy for evacuation of probable epidural abscess and decompression of the canal and is POD1.  She will continue to follow on antibiotic course and will consult ID pending on results. -Follow up on Aerobic and Aerobic/Anaerobic Cx and both pending  -Continue antibiotics with IV Ceftriaxone  2 g every 12 and IV Vancomycin 750 mg every 48 hours -Pain control with Acetaminophen  1000 mg p.o. Q6h scheduled, Hydrocodone -Acetaminophen  7.5-325 mg 1 tablet p.o. every 6 scheduled, and IV morphine  2 mg Q2 as needed for severe pain: Continue with supportive care with ondansetron  4 mg p.o./IV every 6 as needed nausea vomiting and bowel regimen senna 8.6 mg p.o. twice daily -Also continue with Tizanidine 2 mg p.o. every 8 as needed for muscle spasms -Will need PT/OT to Evaluate and Treat and they are recommending SNF; Neurosurgery feels that the foot drop is improving and they recommended continue work with therapy and obtaining TOC consult for rehab placement   Leukocytosis and Low-grade fever, Improved  -Acute.  Patient noted to have a low-grade fever 100.1 F and WBC elevated at 15.3 on admission. This could possibly be secondary to atelectasis she has been in the bed here last couple of days versus possibility of underlying infection. -Incentive spirometry and flutter valve -WBC went from 15.3 -> 12.9 -> 11.2.   -Follow-up ESR was 47 and CRP was 3.5 -Blood Culture x2 showed NGTD @ 2 Days  - Check chest x-ray and U/A showed a cloudy appearance with small hemoglobin, large leukocytes, positive nitrites, rare bacteria, 11-20 RBCs per high-power field and 50 WBCs with urine culture not being obtained. -Chest x-ray done and showed No acute cardiopulmonary process but did show Stable ossific density along the shoulder.  Hypokalemia: Mild and improved as K+ went from 3.1 -> 4.1 -> 3.9. CTM and Replete as necessary. Mag Level is now 2.2. CTM and Replete as Necessary. Repeat CMP in the AM  ? Urinary  Retention  Chronic Kidney Disease Stage IV / Metabolic Acidosis Patient noted to have some suprapubic tenderness on palpation on physical exam.  Creatinine noted to be 1.98 with BUN 20.  This appears to be around patient's baseline 1.6-2. BUN/Cr Trend: Recent Labs  Lab 08/29/24 1141 08/29/24 1219 08/30/24 1416 08/31/24 0256  BUN 20 22 23 23   CREATININE 1.98* 2.10* 1.86* 1.82*  -Check serial bladder scans for concern for urinary retention -MA is improved and CO2 is now 22, AG is 11, and Chloride Level is 108 -In-N-Out cath as needed/orders placed for Foley catheter persistent urinary retention -U/A as above -Avoid Nephrotoxic Medications, Contrast Dyes, Hypotension and Dehydration to Ensure Adequate Renal Perfusion and will need to Renally Adjust Meds -Continue to Monitor and Trend Renal Function carefully and repeat CMP in the AM   Essential Hypertension: Blood pressures currently maintained. Continue Carvedilol  12.5 mg po BID. CTM BP per Protocol. Last BP reading was 129/70   COPD: No significant wheezes or rhonchi appreciated on physical exam at this time. - reathing treatments as needed w/ Albuterol 2.5 mg Neb q4hprn Wheezing and SOB   Coronary Artery Disease Hyperlipidemia Patient with a history of STEMI.  No anginal symptoms reported. -Hold Clopidogrel  given need for Surgical Intervention -Continue Beta-blocker w/ Carvedilol  12.5 mg po BID, Ezetimibe  10 mg po Daily, and Rosuvastatin  20 mg po Daily    Rheumatoid Arthritis:  Continue Hydroxychloroquine  200 mg po daily and Leflunomide  20 mg po daily    History of Breast Cancer: Noted  Normocytic Anemia: Hgb/Hct Trend:  Recent Labs  Lab 08/29/24 1141 08/29/24 1219 08/30/24 1416 08/31/24 0256  HGB 11.9* 11.6* 10.0* 9.1*  HCT 37.5 34.0* 31.9* 28.9*  MCV 94.2  --  96.4 96.7  -Check Anemia Panel in the AM. CTM for S/Sx of Bleeding; No overt bleeding noted. Repeat CBC in the AM  Hypoalbuminemia: Patient's Albumin  Lvl went  from 3.2 -> 2.7 -> 2.3. CTM and Trend and repeat CMP in the AM  Class I Obesity: Complicates overall prognosis and care. Estimated body mass index is 33.07 kg/m as calculated from the following:   Height as of 08/04/24: 5' 1 (1.549 m).   Weight as of 08/04/24: 79.4 kg. Weight Loss and Dietary Counseling given   DVT prophylaxis: SCD's Start: 08/30/24 1148    Code Status: Full Code Family Communication: No family present @ bedside   Disposition Plan:  Level of care: Med-Surg Status is: Inpatient Remains inpatient appropriate because: Needs further clinical improvement and PT OT recommending SNF.   Consultants:  Neurosurgery (primary) TRH  Procedures:  As delineated as above  Antimicrobials:  Anti-infectives (From admission, onward)    Start     Dose/Rate Route Frequency Ordered Stop   09/01/24 1000  vancomycin (VANCOREADY) IVPB 750 mg/150 mL  Status:  Discontinued        750 mg 150 mL/hr over 60 Minutes Intravenous Every 48 hours 08/30/24 1258 08/31/24 1355   09/01/24 1000  cefTRIAXone  (ROCEPHIN ) 2 g in sodium chloride  0.9 % 100 mL IVPB        2 g 200 mL/hr over 30 Minutes Intravenous Every 24 hours 08/31/24 1324     08/30/24 1345  vancomycin (VANCOREADY) IVPB 750 mg/150 mL        750  mg 150 mL/hr over 60 Minutes Intravenous STAT 08/30/24 1258 08/30/24 1448   08/30/24 1245  cefTRIAXone  (ROCEPHIN ) 2 g in sodium chloride  0.9 % 100 mL IVPB  Status:  Discontinued        2 g 200 mL/hr over 30 Minutes Intravenous Every 12 hours 08/30/24 1147 08/31/24 1324   08/30/24 0915  vancomycin (VANCOCIN) IVPB 1000 mg/200 mL premix        1,000 mg 200 mL/hr over 60 Minutes Intravenous  Once 08/30/24 0909 08/30/24 1120   08/30/24 0915  cefTRIAXone  (ROCEPHIN ) 1 g in sodium chloride  0.9 % 100 mL IVPB        1 g 200 mL/hr over 30 Minutes Intravenous  Once 08/30/24 0909 08/30/24 1844   08/29/24 2015  hydroxychloroquine  (PLAQUENIL ) tablet 200 mg        200 mg Oral Daily 08/29/24 1920          Subjective: Seen and examined at bedside sitting in the chair and feels okay.  Sitting in the chair at bedside and had no complaints.  Having some pain earlier but states that is pretty stable.  Slept very well.  No other concerns or complaints at this time.  Objective: Vitals:   08/30/24 2145 08/31/24 0200 08/31/24 0552 08/31/24 0844  BP: (!) 112/57 (!) 107/55 122/62 129/70  Pulse: 73 69 66 75  Resp: 16  16   Temp: 98.2 F (36.8 C) 98.4 F (36.9 C) 97.6 F (36.4 C)   TempSrc: Oral Oral Oral   SpO2: 97% 94% 93%     Intake/Output Summary (Last 24 hours) at 08/31/2024 1540 Last data filed at 08/31/2024 0300 Gross per 24 hour  Intake 350 ml  Output 250 ml  Net 100 ml   There were no vitals filed for this visit.  Examination: Physical Exam:  Constitutional: WN/WD obese chronically ill-appearing Caucasian female sitting in the chair in NAD Respiratory: Diminished to auscultation bilaterally, no wheezing, rales, rhonchi or crackles. Normal respiratory effort and patient is not tachypenic. No accessory muscle use. Unlabored breathing  Cardiovascular: RRR, no murmurs / rubs / gallops. S1 and S2 auscultated. No extremity edema.  Abdomen: Soft, non-tender, distended 2/2 body habitus. Bowel sounds positive.  GU: Deferred. Musculoskeletal: No clubbing / cyanosis of digits/nails. No joint deformity upper and lower extremities. Skin: No rashes, lesions, ulcers on a limited skin evaluation. No induration; Warm and dry.  Neurologic: CN 2-12 grossly intact with no focal deficits. Romberg sign and cerebellar reflexes not assessed.  Psychiatric: Normal judgment and insight. Alert and oriented x 3. Normal mood and appropriate affect.   Data Reviewed: I have personally reviewed following labs and imaging studies  CBC: Recent Labs  Lab 08/29/24 1141 08/29/24 1219 08/30/24 1416 08/31/24 0256  WBC 15.3*  --  12.9* 11.2*  NEUTROABS 13.3*  --  11.8* 9.8*  HGB 11.9* 11.6* 10.0* 9.1*  HCT  37.5 34.0* 31.9* 28.9*  MCV 94.2  --  96.4 96.7  PLT 180  --  180 180   Basic Metabolic Panel: Recent Labs  Lab 08/29/24 1141 08/29/24 1219 08/30/24 1416 08/31/24 0256  NA 141 138 141 141  K 3.2* 3.1* 4.1 3.9  CL 105 109 109 108  CO2 23  --  21* 22  GLUCOSE 106* 103* 154* 185*  BUN 20 22 23 23   CREATININE 1.98* 2.10* 1.86* 1.82*  CALCIUM  9.2  --  8.3* 8.1*  MG  --   --  2.4 2.2  PHOS  --   --  2.6 2.9   GFR: CrCl cannot be calculated (Unknown ideal weight.). Liver Function Tests: Recent Labs  Lab 08/29/24 1141 08/30/24 1416 08/31/24 0256  AST 15 16 14*  ALT 10 10 9   ALKPHOS 56 48 45  BILITOT 0.3 0.5 0.3  PROT 6.2* 5.5* 4.9*  ALBUMIN  3.2* 2.7* 2.3*   No results for input(s): LIPASE, AMYLASE in the last 168 hours. No results for input(s): AMMONIA in the last 168 hours. Coagulation Profile: No results for input(s): INR, PROTIME in the last 168 hours. Cardiac Enzymes: No results for input(s): CKTOTAL, CKMB, CKMBINDEX, TROPONINI in the last 168 hours. BNP (last 3 results) No results for input(s): PROBNP in the last 8760 hours. HbA1C: No results for input(s): HGBA1C in the last 72 hours. CBG: No results for input(s): GLUCAP in the last 168 hours. Lipid Profile: No results for input(s): CHOL, HDL, LDLCALC, TRIG, CHOLHDL, LDLDIRECT in the last 72 hours. Thyroid  Function Tests: No results for input(s): TSH, T4TOTAL, FREET4, T3FREE, THYROIDAB in the last 72 hours. Anemia Panel: No results for input(s): VITAMINB12, FOLATE, FERRITIN, TIBC, IRON, RETICCTPCT in the last 72 hours. Sepsis Labs: No results for input(s): PROCALCITON, LATICACIDVEN in the last 168 hours.  Recent Results (from the past 240 hours)  Resp panel by RT-PCR (RSV, Flu A&B, Covid) Anterior Nasal Swab     Status: None   Collection Time: 08/29/24  3:32 PM   Specimen: Anterior Nasal Swab  Result Value Ref Range Status   SARS Coronavirus 2 by  RT PCR NEGATIVE NEGATIVE Final   Influenza A by PCR NEGATIVE NEGATIVE Final   Influenza B by PCR NEGATIVE NEGATIVE Final    Comment: (NOTE) The Xpert Xpress SARS-CoV-2/FLU/RSV plus assay is intended as an aid in the diagnosis of influenza from Nasopharyngeal swab specimens and should not be used as a sole basis for treatment. Nasal washings and aspirates are unacceptable for Xpert Xpress SARS-CoV-2/FLU/RSV testing.  Fact Sheet for Patients: bloggercourse.com  Fact Sheet for Healthcare Providers: seriousbroker.it  This test is not yet approved or cleared by the [place]  FDA and has been authorized for detection and/or diagnosis of SARS-CoV-2 by FDA under an Emergency Use Authorization (EUA). This EUA will remain in effect (meaning this test can be used) for the duration of the COVID-19 declaration under Section 564(b)(1) of the Act, 21 U.S.C. section 360bbb-3(b)(1), unless the authorization is terminated or revoked.     Resp Syncytial Virus by PCR NEGATIVE NEGATIVE Final    Comment: (NOTE) Fact Sheet for Patients: bloggercourse.com  Fact Sheet for Healthcare Providers: seriousbroker.it  This test is not yet approved or cleared by the [place]  FDA and has been authorized for detection and/or diagnosis of SARS-CoV-2 by FDA under an Emergency Use Authorization (EUA). This EUA will remain in effect (meaning this test can be used) for the duration of the COVID-19 declaration under Section 564(b)(1) of the Act, 21 U.S.C. section 360bbb-3(b)(1), unless the authorization is terminated or revoked.  Performed at [facility], [address] [place], [place] [address]   Culture, blood (Routine X 2) w Reflex to ID Panel     Status: None (Preliminary result)   Collection Time: 08/29/24  6:48 PM   Specimen: BLOOD LEFT HAND  Result Value Ref Range Status   Specimen  Description BLOOD LEFT HAND  Final   Special Requests   Final    BOTTLES DRAWN AEROBIC AND ANAEROBIC Blood Culture results may not be optimal due to an inadequate volume of blood  received in culture bottles   Culture   Final    NO GROWTH 2 DAYS Performed at [facility], [address] [place], [place] [address]    Report Status PENDING  Incomplete  Culture, blood (Routine X 2) w Reflex to ID Panel     Status: None (Preliminary result)   Collection Time: 08/29/24  6:48 PM   Specimen: BLOOD  Result Value Ref Range Status   Specimen Description BLOOD  Final   Special Requests   Final    AEROBIC BOTTLE ONLY Blood Culture results may not be optimal due to an inadequate volume of blood received in culture bottles   Culture   Final    NO GROWTH 2 DAYS Performed at [facility], [address], [place], [place] [address]    Report Status PENDING  Incomplete  Surgical PCR screen     Status: None   Collection Time: 08/29/24 10:55 PM   Specimen: Nasal Mucosa; Nasal Swab  Result Value Ref Range Status   MRSA, PCR NEGATIVE NEGATIVE Final   Staphylococcus aureus NEGATIVE NEGATIVE Final    Comment: (NOTE) The Xpert SA Assay (FDA approved for NASAL specimens in patients 62 years of age and older), is one component of a comprehensive surveillance program. It is not intended to diagnose infection nor to guide or monitor treatment. Performed at [facility], [address], [place], [place] [address]   Aerobic/Anaerobic Culture w Gram Stain (surgical/deep wound)     Status: None (Preliminary result)   Collection Time: 08/30/24 10:01 AM   Specimen: Soft Tissue, Other  Result Value Ref Range Status   Specimen Description TISSUE  Final   Special Requests LUMBAR EPIDURAL TISSUE  Final   Gram Stain NO WBC SEEN NO ORGANISMS SEEN   Final   Culture   Final    NO GROWTH < 24 HOURS Performed at [facility], [address] [place], [place] [address]    Report Status  PENDING  Incomplete  Aerobic Culture w Gram Stain (superficial specimen)     Status: None (Preliminary result)   Collection Time: 08/30/24 10:05 AM   Specimen: Wound  Result Value Ref Range Status   Specimen Description WOUND  Final   Special Requests LUMBAR EPIDURAL SPACE  Final   Gram Stain NO WBC SEEN NO ORGANISMS SEEN   Final   Culture   Final    NO GROWTH < 24 HOURS Performed at [facility], [address] [place], [place] [address]    Report Status PENDING  Incomplete    Radiology Studies: DG Lumbar Spine 1 View Result Date: 08/30/2024 CLINICAL DATA:  Lumbar laminectomy and decompression with micro discectomy. EXAM: LUMBAR SPINE - 1 VIEW COMPARISON:  Lumbar MRI 08/29/2024.  Lumbar CT 08/04/2024. FINDINGS: Three cross-table lateral views of the lumbar spine are submitted from the operating room. On the initial image at 0914 hours, there is a localizing needle posteriorly between the L3 and L4 spinous processes. On the 2nd view, there is a needle more inferiorly overlying the inferior aspect of the L4 spinous process. The final view at 0935 hours demonstrates skin spreaders and surgical instruments posteriorly at L4-5. IMPRESSION: Intraoperative localization of the L4-5 level. Electronically Signed   By: Elsie Perone M.D.   On: 08/30/2024 15:21   MR LUMBAR SPINE WO CONTRAST Result Date: 08/29/2024 EXAM: MRI LUMBAR SPINE 08/29/2024 07:50:15 PM TECHNIQUE: Multiplanar multisequence MRI of the lumbar spine was performed  without the administration of intravenous contrast. COMPARISON: Comparison study 08/18/2004. CLINICAL HISTORY: back pain, fever, recent spine injection FINDINGS: BONES AND ALIGNMENT: Normal and stable alignment of the lumbar vertebral bodies. Normal vertebral body heights. Bone marrow signal is unremarkable. No bone lesions or fractures. No findings suspicious for discitis or osteomyelitis. SPINAL CORD: The conus medullaris terminates at L1-2. SOFT TISSUES: No  significant paraspinal or paravertebral abnormalities retroperitoneal findings. T12-L1: No significant findings. L1-L2: No significant findings. L2-L3: No significant findings. L3-L4: No significant findings. L4-L5: Persistent diffuse bulging annulus in conjunction with advanced facet disease and ligamentum flavum thickening contributing to severe spinal and bilateral lateral recess stenosis. There is a new small fluid collection in the spinal canal posterior to the thecal sac, in between the ligamentum flavum bilaterally. Although this could represent a synovial cyst it was not there on the earlier study from 11/04. It could be related to the injection and be a small liquefied hematoma, or it could be a small abscess. It measures 19 x7 mm. L5-S1: Stable multiple cystic structures posteriorly likely dissecting synovial cysts. Small synovial cysts are noted bilaterally just above the disc space level and appeared to be relatively stable. No disc protrusions, canal or foraminal stenosis at this level. IMPRESSION: 1. New 19 x 7 mm fluid collection posterior to the thecal sac at L4-L5, unlikely a synovial cyst since it was not present on the prior recent MRI, it could be a liquified hematoma or abscess but is certainly contributing to the patient's tight stenosis. 2. Severe spinal canal and bilateral lateral recess stenosis at L4-L5 due to diffuse disc bulge, advanced facet arthropathy, and ligamentum flavum thickening. 3. Stable posterior cystic structures at L5-S1, likely dissecting synovial cysts. 4. Call report initiated. Electronically signed by: Maude Stammer MD 08/29/2024 08:26 PM EST RP Workstation: [id number]   DG CHEST PORT 1 VIEW Result Date: 08/29/2024 EXAM: 1 VIEW(S) XRAY OF THE CHEST 08/29/2024 07:16:00 PM COMPARISON: Chest x-ray 08/04/2024. CLINICAL HISTORY: [id number] Low grade fever [id number] Low grade fever. FINDINGS: LUNGS AND PLEURA: No focal pulmonary opacity. No pleural effusion. No pneumothorax.  HEART AND MEDIASTINUM: Atherosclerotic plaque. Similar silhouetting of the left ventricular apex due to known overlying pericardial fat. BONES AND SOFT TISSUES: 2.2 cm ossific density along the shoulder again noted. IMPRESSION: 1. No acute cardiopulmonary process. 2. Stable ossific density along the shoulder. Electronically signed by: Morgane Naveau MD 08/29/2024 07:30 PM EST RP Workstation: [id number]   Scheduled Meds:  carvedilol   12.5 mg Oral BID WC   ezetimibe   10 mg Oral Daily   gabapentin   300 mg Oral QPM   HYDROcodone -acetaminophen   1 tablet Oral Q6H   hydroxychloroquine   200 mg Oral Daily   leflunomide   20 mg Oral Daily   melatonin  5 mg Oral QPM   multivitamin with minerals  1 tablet Oral Daily   rosuvastatin   20 mg Oral Daily   senna  1 tablet Oral BID   sodium chloride  flush  3 mL Intravenous Q12H   traZODone   100 mg Oral QHS   Continuous Infusions:  0.9 % NaCl with KCl 20 mEq / L     [START ON 09/01/2024] cefTRIAXone  (ROCEPHIN )  IV      LOS: 2 days   Alejandro Marker, DO [facility] Available via Epic secure chat 7am-7pm After these hours, please refer to coverage provider listed on [email or web address] 08/31/2024, 3:40 PM

## 2024-08-31 NOTE — Telephone Encounter (Signed)
 Patient recently had surgery and will be in rehab. She is requesting a refill prescription for gabapentin  (Neurontin ) 100 mg capsules. She plans to follow up in January pending recovery. Her 09/22/24 appointment has been rescheduled to 10/20/24.  Note: Prescption from 05/28/24 says 3 refills but the pharmacy told her she needed to make appt to see provider.

## 2024-08-31 NOTE — Plan of Care (Signed)
  Problem: Education: Goal: Knowledge of General Education information will improve Description: Including pain rating scale, medication(s)/side effects and non-pharmacologic comfort measures Outcome: Progressing   Problem: Nutrition: Goal: Adequate nutrition will be maintained Outcome: Progressing   Problem: Elimination: Goal: Will not experience complications related to urinary retention Outcome: Progressing   Problem: Pain Managment: Goal: General experience of comfort will improve and/or be controlled Outcome: Progressing   Problem: Safety: Goal: Ability to remain free from injury will improve Outcome: Progressing

## 2024-08-31 NOTE — Evaluation (Signed)
 Physical Therapy Evaluation  Patient Details Name: Samantha Mcbride MRN: [id number] DOB: 11-Mar-1951 Today's Date: 08/31/2024  History of Present Illness  Pt is a 73 y/o female who presents s/p L L4-5 hemilaminectomy, medial facetectomy foraminotomies with sublaminar decompression for central and right lateral recess decompression on 08/30/2024. PMH significant for CA, CKD IV, HTN, RA, DOE, IM nail L 2024.  Clinical Impression  Pt admitted with above diagnosis. At the time of PT eval, pt was able to demonstrate transfers and ambulation with gross min-mod assist and RW for support. Pt was educated on precautions, brace application/wearing schedule, appropriate activity progression, and positioning recommendations. Pt currently with functional limitations due to the deficits listed below (see PT Problem List). Pt will benefit from skilled PT to increase their independence and safety with mobility to allow discharge to the venue listed below.          If plan is discharge home, recommend the following: A little help with walking and/or transfers;A little help with bathing/dressing/bathroom;Assistance with cooking/housework;Assist for transportation;Help with stairs or ramp for entrance   Can travel by private vehicle   Yes    Equipment Recommendations Rolling walker (2 wheels)  Recommendations for Other Services       Functional Status Assessment Patient has had a recent decline in their functional status and demonstrates the ability to make significant improvements in function in a reasonable and predictable amount of time.     Precautions / Restrictions Precautions Precautions: Back;Fall Precaution Booklet Issued: Yes (comment) Recall of Precautions/Restrictions: Intact Restrictions Weight Bearing Restrictions Per Provider Order: No      Mobility  Bed Mobility Overal bed mobility: Needs Assistance Bed Mobility: Rolling, Sit to Sidelying Rolling: Min assist Sidelying to sit: Mod  assist       General bed mobility comments: Assist for log roll. Pt unable to initiate trunk elevation to full sitting position without mod assist.    Transfers Overall transfer level: Needs assistance Equipment used: Rolling walker (2 wheels) Transfers: Sit to/from Stand, Bed to chair/wheelchair/BSC Sit to Stand: Min assist   Step pivot transfers: Min assist       General transfer comment: VC's for hand placement on seated surface for safety. Min assist for power up to full stand and for controlled lower down to chair.    Ambulation/Gait Ambulation/Gait assistance: Contact guard assist Gait Distance (Feet): 35 Feet Assistive device: Rolling walker (2 wheels) Gait Pattern/deviations: Step-through pattern, Decreased stride length, Trunk flexed, Shuffle Gait velocity: Decreased Gait velocity interpretation: <1.31 ft/sec, indicative of household ambulator   General Gait Details: Pt with decreased floor clearance and demonstrating short, shuffling steps. Very slow pace. Fatigued quickly with ambulation and required chair to be pulled up before back to the room.  Stairs            Wheelchair Mobility     Tilt Bed    Modified Rankin (Stroke Patients Only)       Balance Overall balance assessment: Needs assistance Sitting-balance support: Single extremity supported, Feet supported Sitting balance-Leahy Scale: Fair Sitting balance - Comments: Pt able to sit EOB with close supervision Postural control: Posterior lean Standing balance support: Bilateral upper extremity supported, During functional activity, Reliant on assistive device for balance Standing balance-Leahy Scale: Poor Standing balance comment: Dependent on RW and external support                             Pertinent Vitals/Pain Pain Assessment Pain  Assessment: Faces Faces Pain Scale: Hurts little more Pain Location: back/incision site Pain Descriptors / Indicators: Grimacing, Operative  site guarding, Sore Pain Intervention(s): Limited activity within patient's tolerance, Monitored during session, Repositioned    Home Living Family/patient expects to be discharged to:: Private residence Living Arrangements: Alone Available Help at Discharge: Family;Available PRN/intermittently (daughter lives down the street. Daughter has an office set-up in Pt house and will be there in the morning until lunch and then returns for dinner.) Type of Home: House (townhome) Home Access: Stairs to enter Entrance Stairs-Rails: None Entrance Stairs-Number of Steps: 1   Home Layout: One level Home Equipment: Rollator (4 wheels);Standard Walker;Cane - quad;Cane - single point;BSC/3in1;Shower seat;Grab bars - tub/shower;Hand held shower head      Prior Function Prior Level of Function : Needs assist;Driving;History of Falls (last six months)       Physical Assist : Mobility (physical);ADLs (physical) Mobility (physical): Stairs;Gait ADLs (physical): IADLs;Bathing;Dressing Mobility Comments: Reports 3 falls the night before she came to hospital, extensive hx of falls prior. Falls going up STE home. ADLs Comments: Assistance on IADLs, getting into shower, LB dressing, bathing.     Extremity/Trunk Assessment   Upper Extremity Assessment Upper Extremity Assessment: Defer to OT evaluation    Lower Extremity Assessment Lower Extremity Assessment: Generalized weakness;LLE deficits/detail LLE Deficits / Details: Pain, decreased strength and muscular endurance consistent with pre-op diagnosis    Cervical / Trunk Assessment Cervical / Trunk Assessment: Back Surgery  Communication   Communication Communication: No apparent difficulties    Cognition Arousal: Alert Behavior During Therapy: WFL for tasks assessed/performed   PT - Cognitive impairments: No apparent impairments                         Following commands: Intact       Cueing Cueing Techniques: Verbal cues,  Gestural cues     General Comments General comments (skin integrity, edema, etc.): Pt educated on back precautions and functional implications of precautions on ADL tasks. Pt able to return verbalization of 3/3 precautions at end of session. Handout provided.    Exercises     Assessment/Plan    PT Assessment Patient needs continued PT services  PT Problem List Decreased strength;Decreased activity tolerance;Decreased balance;Decreased mobility;Decreased knowledge of use of DME;Decreased safety awareness;Decreased knowledge of precautions;Pain       PT Treatment Interventions DME instruction;Gait training;Stair training;Functional mobility training;Therapeutic activities;Therapeutic exercise;Patient/family education    PT Goals (Current goals can be found in the Care Plan section)  Acute Rehab PT Goals Patient Stated Goal: Return home after SNF stay PT Goal Formulation: With patient Time For Goal Achievement: 09/14/24 Potential to Achieve Goals: Good    Frequency Min 5X/week     Co-evaluation               AM-PAC PT 6 Clicks Mobility  Outcome Measure Help needed turning from your back to your side while in a flat bed without using bedrails?: A Little Help needed moving from lying on your back to sitting on the side of a flat bed without using bedrails?: A Little Help needed moving to and from a bed to a chair (including a wheelchair)?: A Little Help needed standing up from a chair using your arms (e.g., wheelchair or bedside chair)?: A Little Help needed to walk in hospital room?: A Little Help needed climbing 3-5 steps with a railing? : A Little 6 Click Score: 18    End of Session Equipment  Utilized During Treatment: Gait belt Activity Tolerance: Patient limited by fatigue Patient left: in chair;with call bell/phone within reach;with chair alarm set Nurse Communication: Mobility status PT Visit Diagnosis: Unsteadiness on feet (R26.81);Pain Pain - part of body:   (back)    Time: 8949-8887 PT Time Calculation (min) (ACUTE ONLY): 22 min   Charges:   PT Evaluation $PT Eval Moderate Complexity: 1 Mod   PT General Charges $$ ACUTE PT VISIT: 1 Visit         Leita Sable, PT, DPT Acute Rehabilitation Services Secure Chat Preferred Office: [phone]   Leita JONETTA Sable 08/31/2024, 11:29 AM

## 2024-08-31 NOTE — Evaluation (Signed)
 Occupational Therapy Evaluation Patient Details Name: Samantha Mcbride MRN: [id number] DOB: 1951-08-01 Today's Date: 08/31/2024   History of Present Illness   73 y.o. female with a grade 1 anterolisthesis at L4-5 with severe spinal stenosis, presents 11/15 with fever and leukocytosis and worsening of back pain with the left foot drop. S/p Left L4-5 hemilaminectomy, medial facetectomy foraminotomies with sublaminar decompression for central and right lateral recess decompression 11/16. PMH: CAD s/p STEMI and DES, peri-infarct V. tach, COPD, CKD 4, RA, RLS, HLD, HTN, obesity, breast cancer in remission.     Clinical Impressions PTA Pt required assistance for ambulation with rollator and for engagement in ADL tasks. Pt with history of falls inside and outside of home. Pt currently requires Min A for functional transfers with RW and requires up to Mod A for engagement in ADL tasks. Pt is primarily limited by generalized weakness, unsteadiness on feet, decreased knowledge of back precautions, and decreased activity tolerance. Pt will benefit from skilled OT services in acute care to facilitate progress towards goals. Patient will benefit from continued inpatient follow up therapy, <3 hours/day to maximize occupational independence and decrease burden of care for a safe return home.      If plan is discharge home, recommend the following:   A little help with walking and/or transfers;A little help with bathing/dressing/bathroom;Assistance with cooking/housework;Assist for transportation;Help with stairs or ramp for entrance     Functional Status Assessment   Patient has had a recent decline in their functional status and demonstrates the ability to make significant improvements in function in a reasonable and predictable amount of time.     Equipment Recommendations   Other (comment) (defer to next venue)     Recommendations for Other Services         Precautions/Restrictions    Precautions Precautions: Back Precaution Booklet Issued: Yes (comment) Recall of Precautions/Restrictions: Intact Restrictions Weight Bearing Restrictions Per Provider Order: No     Mobility Bed Mobility Overal bed mobility: Needs Assistance Bed Mobility: Rolling, Sidelying to Sit, Sit to Sidelying Rolling: Min assist, Used rails Sidelying to sit: Min assist     Sit to sidelying: Mod assist General bed mobility comments: Pt educated on log roll technique, required Min A to roll to the L and bring BLE off of bed. Pt required increased time to eleavte trunk. Pt required Mod A to return to sidelying with management of BLE. Pt required Max. A to reposition in bed towards HOB.    Transfers Overall transfer level: Needs assistance Equipment used: Rolling walker (2 wheels) Transfers: Sit to/from Stand, Bed to chair/wheelchair/BSC Sit to Stand: Min assist     Step pivot transfers: Min assist     General transfer comment: Pt initially requiring verbal cues for sit to stand and CGA for step pivot transfer to [facility] on L side. Pt with decreased stability on return to bed requiring Min A to rise from [facility]. Pt with posterior lean in standing and unsteadiness on feet requiring Min A gestural cues to facilitate trunk extension and extend legs. Pt required verbal cues for proper hand placement on RW.      Balance Overall balance assessment: Needs assistance Sitting-balance support: Single extremity supported, Feet supported Sitting balance-Leahy Scale: Fair Sitting balance - Comments: Pt able to sit EOB with close supervision Postural control: Posterior lean Standing balance support: Bilateral upper extremity supported, During functional activity, Reliant on assistive device for balance Standing balance-Leahy Scale: Poor Standing balance comment: Dependent on RW and external support  ADL either performed or assessed with clinical judgement   ADL Overall  ADL's : Needs assistance/impaired Eating/Feeding: Independent   Grooming: Set up;Sitting   Upper Body Bathing: Minimal assistance;Sitting   Lower Body Bathing: Moderate assistance;Adhering to back precautions;Sitting/lateral leans   Upper Body Dressing : Contact guard assist;Sitting   Lower Body Dressing: Moderate assistance;Adhering to back precautions;Sitting/lateral leans Lower Body Dressing Details (indicate cue type and reason): Pt unable to dress LLE. Required Min A to dress RLE. Pt reports needing some assistance to pull up LB clothing after toileting Toilet Transfer: Minimal assistance;Ambulation;Cueing for safety;BSC/3in1;Rolling walker (2 wheels) Toilet Transfer Details (indicate cue type and reason): Required vebal cues for safety, extending trunk and anterior lean. Toileting- Clothing Manipulation and Hygiene: Maximal assistance;Sit to/from stand               Vision Baseline Vision/History: 1 Wears glasses Patient Visual Report: No change from baseline Vision Assessment?: Wears glasses for reading;Wears glasses for driving     Perception         Praxis         Pertinent Vitals/Pain Pain Assessment Pain Assessment: 0-10 Pain Score: 6  Pain Location: back, with mobility Pain Descriptors / Indicators: Grimacing, Guarding Pain Intervention(s): Limited activity within patient's tolerance, Monitored during session, Repositioned     Extremity/Trunk Assessment Upper Extremity Assessment Upper Extremity Assessment: Generalized weakness   Lower Extremity Assessment Lower Extremity Assessment: Defer to PT evaluation   Cervical / Trunk Assessment Cervical / Trunk Assessment: Back Surgery   Communication Communication Communication: No apparent difficulties   Cognition Arousal: Alert Behavior During Therapy: WFL for tasks assessed/performed Cognition: No apparent impairments                               Following commands: Intact        Cueing  General Comments   Cueing Techniques: Verbal cues;Gestural cues  Pt educated on back precautions and functional implications of precautions on ADL tasks. Pt able to return verbalization of 3/3 precautions at end of session. Handout provided.   Exercises     Shoulder Instructions      Home Living Family/patient expects to be discharged to:: Private residence Living Arrangements: Alone Available Help at Discharge: Family;Available PRN/intermittently (daughter lives down the street. Daughter has an office set-up in Pt house and will be there in the morning until lunch and then returns for dinner.) Type of Home: House (townhome) Home Access: Stairs to enter [employer] of Steps: 1 Entrance Stairs-Rails: None Home Layout: One level     Bathroom Shower/Tub: Producer, Television/film/video: Handicapped height Bathroom Accessibility: Yes How Accessible: Accessible via walker Home Equipment: Rollator (4 wheels);Standard Walker;Cane - quad;Cane - single point;BSC/3in1;Shower seat;Grab bars - tub/shower;Hand held shower head          Prior Functioning/Environment Prior Level of Function : Needs assist;Driving;History of Falls (last six months)       Physical Assist : Mobility (physical);ADLs (physical) Mobility (physical): Stairs;Gait ADLs (physical): IADLs;Bathing;Dressing Mobility Comments: Reports 3 falls the night before she came to hospital, extensive hx of falls prior. Falls going up STE home. ADLs Comments: Assistance on IADLs, getting into shower, LB dressing, bathing.    OT Problem List: Decreased strength;Decreased activity tolerance;Impaired balance (sitting and/or standing);Decreased safety awareness;Decreased knowledge of use of DME or AE;Decreased knowledge of precautions;Pain   OT Treatment/Interventions: Self-care/ADL training;Energy conservation;DME and/or AE instruction;Therapeutic exercise;Therapeutic activities;Patient/family  education;Balance training  OT Goals(Current goals can be found in the care plan section)   Acute Rehab OT Goals Patient Stated Goal: to go to rehab OT Goal Formulation: With patient Time For Goal Achievement: 09/14/24 Potential to Achieve Goals: Good ADL Goals Pt Will Perform Lower Body Bathing: with contact guard assist;with adaptive equipment;sitting/lateral leans Pt Will Perform Lower Body Dressing: with min assist;with adaptive equipment;sitting/lateral leans Pt Will Transfer to Toilet: with supervision;bedside commode;ambulating Pt Will Perform Toileting - Clothing Manipulation and hygiene: with min assist;sitting/lateral leans Additional ADL Goal #1: Pt will engage in bed mobility utilizing log roll technique with supervision as a precursor to engagement in ADL tasks OOB.   OT Frequency:  Min 2X/week    Co-evaluation              AM-PAC OT 6 Clicks Daily Activity     Outcome Measure Help from another person eating meals?: None Help from another person taking care of personal grooming?: A Little Help from another person toileting, which includes using toliet, bedpan, or urinal?: A Lot Help from another person bathing (including washing, rinsing, drying)?: A Lot Help from another person to put on and taking off regular upper body clothing?: A Little Help from another person to put on and taking off regular lower body clothing?: A Lot 6 Click Score: 16   End of Session Equipment Utilized During Treatment: Gait belt;Rolling walker (2 wheels) Nurse Communication: Mobility status  Activity Tolerance: Patient tolerated treatment well Patient left: in bed;with call bell/phone within reach;with nursing/sitter in room;with SCD's reapplied  OT Visit Diagnosis: Unsteadiness on feet (R26.81);Repeated falls (R29.6);Muscle weakness (generalized) (M62.81);History of falling (Z91.81);Pain Pain - part of body:  (back)                Time: 9191-9154 OT Time Calculation  (min): 37 min Charges:  OT General Charges $OT Visit: 1 Visit OT Evaluation $OT Eval Moderate Complexity: 1 Mod OT Treatments $Therapeutic Activity: 8-22 mins  Maurilio CROME, OTR/L.  [facility] Acute Rehabilitation  Office: [phone]   Maurilio PARAS Nehemiah Mcfarren 08/31/2024, 9:05 AM

## 2024-09-01 DIAGNOSIS — M4316 Spondylolisthesis, lumbar region: Secondary | ICD-10-CM | POA: Diagnosis not present

## 2024-09-01 DIAGNOSIS — D72829 Elevated white blood cell count, unspecified: Secondary | ICD-10-CM | POA: Diagnosis not present

## 2024-09-01 DIAGNOSIS — E876 Hypokalemia: Secondary | ICD-10-CM | POA: Diagnosis not present

## 2024-09-01 DIAGNOSIS — R509 Fever, unspecified: Secondary | ICD-10-CM | POA: Diagnosis not present

## 2024-09-01 LAB — RETICULOCYTES
Immature Retic Fract: 9.6 % (ref 2.3–15.9)
RBC.: 3.35 MIL/uL — ABNORMAL LOW (ref 3.87–5.11)
Retic Count, Absolute: 57 K/uL (ref 19.0–186.0)
Retic Ct Pct: 1.7 % (ref 0.4–3.1)

## 2024-09-01 LAB — CBC WITH DIFFERENTIAL/PLATELET
Abs Immature Granulocytes: 0.11 K/uL — ABNORMAL HIGH (ref 0.00–0.07)
Basophils Absolute: 0 K/uL (ref 0.0–0.1)
Basophils Relative: 0 %
Eosinophils Absolute: 0 K/uL (ref 0.0–0.5)
Eosinophils Relative: 1 %
HCT: 32.7 % — ABNORMAL LOW (ref 36.0–46.0)
Hemoglobin: 10.4 g/dL — ABNORMAL LOW (ref 12.0–15.0)
Immature Granulocytes: 1 %
Lymphocytes Relative: 13 %
Lymphs Abs: 1.1 K/uL (ref 0.7–4.0)
MCH: 30.5 pg (ref 26.0–34.0)
MCHC: 31.8 g/dL (ref 30.0–36.0)
MCV: 95.9 fL (ref 80.0–100.0)
Monocytes Absolute: 0.6 K/uL (ref 0.1–1.0)
Monocytes Relative: 7 %
Neutro Abs: 6.8 K/uL (ref 1.7–7.7)
Neutrophils Relative %: 78 %
Platelets: 196 K/uL (ref 150–400)
RBC: 3.41 MIL/uL — ABNORMAL LOW (ref 3.87–5.11)
RDW: 13.4 % (ref 11.5–15.5)
WBC: 8.7 K/uL (ref 4.0–10.5)
nRBC: 0 % (ref 0.0–0.2)

## 2024-09-01 LAB — COMPREHENSIVE METABOLIC PANEL WITH GFR
ALT: 10 U/L (ref 0–44)
AST: 13 U/L — ABNORMAL LOW (ref 15–41)
Albumin: 2.8 g/dL — ABNORMAL LOW (ref 3.5–5.0)
Alkaline Phosphatase: 48 U/L (ref 38–126)
Anion gap: 14 (ref 5–15)
BUN: 27 mg/dL — ABNORMAL HIGH (ref 8–23)
CO2: 22 mmol/L (ref 22–32)
Calcium: 8.6 mg/dL — ABNORMAL LOW (ref 8.9–10.3)
Chloride: 104 mmol/L (ref 98–111)
Creatinine, Ser: 2.11 mg/dL — ABNORMAL HIGH (ref 0.44–1.00)
GFR, Estimated: 24 mL/min — ABNORMAL LOW (ref >60)
Glucose, Bld: 88 mg/dL (ref 70–99)
Potassium: 3.8 mmol/L (ref 3.5–5.1)
Sodium: 140 mmol/L (ref 135–145)
Total Bilirubin: 0.4 mg/dL (ref 0.0–1.2)
Total Protein: 5.4 g/dL — ABNORMAL LOW (ref 6.5–8.1)

## 2024-09-01 LAB — FERRITIN: Ferritin: 224 ng/mL (ref 11–307)

## 2024-09-01 LAB — AEROBIC CULTURE W GRAM STAIN (SUPERFICIAL SPECIMEN)
Culture: NO GROWTH
Gram Stain: NONE SEEN

## 2024-09-01 LAB — IRON AND TIBC
Iron: 64 ug/dL (ref 28–170)
Saturation Ratios: 34 % — ABNORMAL HIGH (ref 10.4–31.8)
TIBC: 189 ug/dL — ABNORMAL LOW (ref 250–450)
UIBC: 125 ug/dL

## 2024-09-01 LAB — MAGNESIUM: Magnesium: 2.4 mg/dL (ref 1.7–2.4)

## 2024-09-01 LAB — FOLATE: Folate: 19.2 ng/mL (ref >5.9)

## 2024-09-01 LAB — VITAMIN B12: Vitamin B-12: 527 pg/mL (ref 180–914)

## 2024-09-01 LAB — PHOSPHORUS: Phosphorus: 3 mg/dL (ref 2.5–4.6)

## 2024-09-01 NOTE — [place] FL2 (Signed)
 [place]  MEDICAID FL2 LEVEL OF CARE FORM     IDENTIFICATION  Patient Name: Samantha Mcbride Birthdate: 22-Jan-1951 Sex: female Admission Date (Current Location): 08/29/2024  [facility] and [place] Number:  Producer, Television/film/video and Address:  [facility] [place], [address], [place], [place] [address]      Provider Number: [id number]  Attending Physician Name and Address:  Joshua Alm Hamilton, MD  Relative Name and Phone Number:       Current Level of Care: Hospital Recommended Level of Care: Skilled Nursing Facility Prior Approval Number:    Date Approved/Denied:   PASRR Number: [id number]  Discharge Plan: SNF    Current Diagnoses: Patient Active Problem List   Diagnosis Date Noted   S/P lumbar laminectomy 08/30/2024   Lower extremity weakness 08/29/2024   Spondylolisthesis at L4-L5 level 08/29/2024   Leukocytosis 08/29/2024   Low grade fever 08/29/2024   History of breast cancer 08/29/2024   Ischemic cardiomyopathy 07/14/2024   Hip fracture (HCC) 10/18/2022   Intertrochanteric fracture of left hip (HCC) 10/17/2022   Breast cancer (HCC) 10/03/2021   Osteoarthritis 10/03/2021   Osteoporosis 07/10/2021   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 05/11/2021   Age-related osteoporosis without current pathological fracture 02/02/2021   Coronary artery disease 02/02/2021   Chronic kidney disease, stage 4 (severe) (HCC) 02/02/2021   Insomnia 02/02/2021   Localized, primary osteoarthritis of hand 02/02/2021   Osteopenia 02/02/2021   Restless legs 02/02/2021   Rheumatoid arthritis (HCC) 02/02/2021   Sciatica 02/02/2021   Vitamin D  deficiency 02/02/2021   Genetic testing 07/15/2020   Preoperative cardiovascular examination 07/11/2020   Family history of breast cancer    Family history of skin cancer    Malignant neoplasm of upper-outer quadrant of right breast in female, estrogen receptor positive (HCC) 06/15/2020    Pain in left knee 05/28/2019   Ureteral calculus 05/11/2019   Obesity (BMI 30-39.9) 07/18/2013   Essential hypertension    Hyperlipidemia with target low density lipoprotein (LDL) cholesterol less than 55 mg/dL 89/97/7985   Glucose intolerance (impaired glucose tolerance) 07/16/2013   Ventricular tachycardia, sustained; Peri-infarct.  No further episodes 08/28/2011   Hypokalemia 08/27/2011   H/O Anterior STEMI:  Proximal LAD  insertion of a 3.5x24 mm Promus element DES (08/2011) 08/27/2011   CAD S/P percutaneous coronary angioplasty 08/16/2011    Orientation RESPIRATION BLADDER Height & Weight     Self, Time, Situation, Place  Normal Continent Weight:   Height:     BEHAVIORAL SYMPTOMS/MOOD NEUROLOGICAL BOWEL NUTRITION STATUS      Continent Diet (See DC Summary)  AMBULATORY STATUS COMMUNICATION OF NEEDS Skin   Limited Assist Verbally Other (Comment) (Closed surgical incision on back)                       Personal Care Assistance Level of Assistance  Bathing, Feeding, Dressing Bathing Assistance: Limited assistance Feeding assistance: Limited assistance Dressing Assistance: Limited assistance     Functional Limitations Info  Sight, Hearing, Speech Sight Info: Impaired Hearing Info: Adequate Speech Info: Adequate    SPECIAL CARE FACTORS FREQUENCY  PT (By licensed PT), OT (By licensed OT)     PT Frequency: 5x/week OT Frequency: 5x/week            Contractures Contractures Info: Not present    Additional Factors Info  Code Status, Allergies Code Status Info: Full Allergies Info: No known allergies  Current Medications (09/01/2024):  This is the current hospital active medication list Current Facility-Administered Medications  Medication Dose Route Frequency Provider Last Rate Last Admin   0.9 % NaCl with KCl 20 mEq/ L  infusion   Intravenous Continuous Joshua Alm Hamilton, MD       albuterol (PROVENTIL) (2.5 MG/3ML) 0.083% nebulizer solution 2.5  mg  2.5 mg Nebulization Q4H PRN Joshua Alm Hamilton, MD       carvedilol  (COREG ) tablet 12.5 mg  12.5 mg Oral BID WC Joshua Alm Hamilton, MD   12.5 mg at 09/01/24 0925   cefTRIAXone  (ROCEPHIN ) 2 g in sodium chloride  0.9 % 100 mL IVPB  2 g Intravenous Q24H [place], Jennifer D, [place] 200 mL/hr at 09/01/24 9061 2 g at 09/01/24 9061   ezetimibe  (ZETIA ) tablet 10 mg  10 mg Oral Daily Joshua Alm Hamilton, MD   10 mg at 09/01/24 9073   gabapentin  (NEURONTIN ) capsule 300 mg  300 mg Oral QPM Joshua Alm Hamilton, MD   300 mg at 08/31/24 1745   HYDROcodone -acetaminophen  (NORCO) 7.5-325 MG per tablet 1 tablet  1 tablet Oral Q6H Joshua Alm Hamilton, MD   1 tablet at 09/01/24 1218   hydroxychloroquine  (PLAQUENIL ) tablet 200 mg  200 mg Oral Daily Joshua Alm Hamilton, MD   200 mg at 09/01/24 9074   leflunomide  (ARAVA ) tablet 20 mg  20 mg Oral Daily Joshua Alm Hamilton, MD   20 mg at 09/01/24 9074   melatonin tablet 5 mg  5 mg Oral QPM Joshua Alm Hamilton, MD   5 mg at 08/31/24 1744   menthol  (CEPACOL) lozenge 3 mg  1 lozenge Oral PRN Joshua Alm Hamilton, MD       Or   phenol (CHLORASEPTIC) mouth spray 1 spray  1 spray Mouth/Throat PRN Joshua Alm Hamilton, MD       morphine  (PF) 2 MG/ML injection 2 mg  2 mg Intravenous Q2H PRN Joshua Alm Hamilton, MD   2 mg at 08/31/24 2138   multivitamin with minerals tablet 1 tablet  1 tablet Oral Daily Joshua Alm Hamilton, MD   1 tablet at 09/01/24 9073   ondansetron  (ZOFRAN ) tablet 4 mg  4 mg Oral Q6H PRN Joshua Alm Hamilton, MD       Or   ondansetron  (ZOFRAN ) injection 4 mg  4 mg Intravenous Q6H PRN Joshua Alm Hamilton, MD       rosuvastatin  (CRESTOR ) tablet 20 mg  20 mg Oral Daily Joshua Alm Hamilton, MD   20 mg at 09/01/24 9073   senna (SENOKOT) tablet 8.6 mg  1 tablet Oral BID Joshua Alm Hamilton, MD   8.6 mg at 09/01/24 9073   sodium chloride  flush (NS) 0.9 % injection 3 mL  3 mL Intravenous Q12H Joshua Alm Hamilton, MD   3 mL at 09/01/24 9060   sodium chloride  flush (NS) 0.9 % injection 3  mL  3 mL Intravenous PRN Joshua Alm Hamilton, MD       tiZANidine (ZANAFLEX) tablet 2 mg  2 mg Oral Q8H PRN Joshua Alm Hamilton, MD       traZODone  (DESYREL ) tablet 100 mg  100 mg Oral QHS Joshua Alm Hamilton, MD   100 mg at 08/31/24 2138     Discharge Medications: Please see discharge summary for a list of discharge medications.  Relevant Imaging Results:  Relevant Lab Results:   Additional Information SSN: [id number]  Jeoffrey LITTIE Moose, LCSWA

## 2024-09-01 NOTE — Care Management Important Message (Signed)
 Important Message  Patient Details  Name: Samantha Mcbride MRN: [id number] Date of Birth: June 29, 1951   Important Message Given:  Yes - Medicare IM     Jennie Laneta Dragon 09/01/2024, 1:22 PM

## 2024-09-01 NOTE — Progress Notes (Signed)
 Physical Therapy Treatment  Patient Details Name: Samantha Mcbride MRN: [id number] DOB: 01-19-1951 Today's Date: 09/01/2024   History of Present Illness Pt is a 73 y/o female who presents s/p L L4-5 hemilaminectomy, medial facetectomy foraminotomies with sublaminar decompression for central and right lateral recess decompression on 08/30/2024. PMH significant for CA, CKD IV, HTN, RA, DOE, IM nail L 2024.    PT Comments  Pt progressing slowly with post-op mobility. She was able to demonstrate transfers and ambulation with gross min assist to CGA and RW for support. Reinforced education on precautions, brace application/wearing schedule, appropriate activity progression, and positioning recommendations. Will continue to follow.      If plan is discharge home, recommend the following: A little help with walking and/or transfers;A little help with bathing/dressing/bathroom;Assistance with cooking/housework;Assist for transportation;Help with stairs or ramp for entrance   Can travel by private vehicle     Yes  Equipment Recommendations  Rolling walker (2 wheels)    Recommendations for Other Services       Precautions / Restrictions Precautions Precautions: Back;Fall Precaution Booklet Issued: Yes (comment) Recall of Precautions/Restrictions: Intact Precaution/Restrictions Comments: Reviewed handout and pt was cued for precautions during functional mobility. Restrictions Weight Bearing Restrictions Per Provider Order: No     Mobility  Bed Mobility Overal bed mobility: Needs Assistance Bed Mobility: Rolling, Sit to Sidelying Rolling: Supervision       Sit to sidelying: Min assist General bed mobility comments: Assist for LE elevation back up into bed at end of session.    Transfers Overall transfer level: Needs assistance Equipment used: Rolling walker (2 wheels) Transfers: Sit to/from Stand Sit to Stand: Min assist           General transfer comment: VC's for hand  placement on seated surface for safety. Min assist for power up to full stand and for controlled lower down to chair.    Ambulation/Gait Ambulation/Gait assistance: Contact guard assist, Min assist Gait Distance (Feet): 25 Feet (25+15+15) Assistive device: Rolling walker (2 wheels) Gait Pattern/deviations: Step-through pattern, Decreased stride length, Trunk flexed, Shuffle Gait velocity: Decreased Gait velocity interpretation: <1.31 ft/sec, indicative of household ambulator   General Gait Details: Pt with decreased floor clearance and demonstrating short, shuffling steps. Very slow pace. Fatigued quickly with ambulation and required chair follow. Trunk became more flexed as pt fatigued.   Stairs             Wheelchair Mobility     Tilt Bed    Modified Rankin (Stroke Patients Only)       Balance Overall balance assessment: Needs assistance Sitting-balance support: Single extremity supported, Feet supported Sitting balance-Leahy Scale: Fair Sitting balance - Comments: Pt able to sit EOB with close supervision Postural control: Posterior lean Standing balance support: Bilateral upper extremity supported, During functional activity, Reliant on assistive device for balance Standing balance-Leahy Scale: Poor Standing balance comment: Dependent on RW and external support                            Communication Communication Communication: No apparent difficulties  Cognition Arousal: Alert Behavior During Therapy: WFL for tasks assessed/performed   PT - Cognitive impairments: No apparent impairments                         Following commands: Intact      Cueing Cueing Techniques: Verbal cues, Gestural cues  Exercises      General  Comments        Pertinent Vitals/Pain Pain Assessment Pain Assessment: 0-10 Pain Score: 6  Pain Location: back and L upper arm Pain Descriptors / Indicators: Grimacing, Operative site guarding, Sore Pain  Intervention(s): Limited activity within patient's tolerance, Monitored during session, Repositioned, Patient requesting pain meds-RN notified    Home Living                          Prior Function            PT Goals (current goals can now be found in the care plan section) Acute Rehab PT Goals Patient Stated Goal: Return home after SNF stay PT Goal Formulation: With patient Time For Goal Achievement: 09/14/24 Potential to Achieve Goals: Good Progress towards PT goals: Progressing toward goals    Frequency    Min 5X/week      PT Plan      Co-evaluation              AM-PAC PT 6 Clicks Mobility   Outcome Measure  Help needed turning from your back to your side while in a flat bed without using bedrails?: A Little Help needed moving from lying on your back to sitting on the side of a flat bed without using bedrails?: A Little Help needed moving to and from a bed to a chair (including a wheelchair)?: A Little Help needed standing up from a chair using your arms (e.g., wheelchair or bedside chair)?: A Little Help needed to walk in hospital room?: A Little Help needed climbing 3-5 steps with a railing? : A Little 6 Click Score: 18    End of Session Equipment Utilized During Treatment: Gait belt Activity Tolerance: Patient limited by fatigue Patient left: in bed;with call bell/phone within reach;with family/visitor present Nurse Communication: Mobility status PT Visit Diagnosis: Unsteadiness on feet (R26.81);Pain Pain - part of body:  (back, upper arm)     Time: 8545-8480 PT Time Calculation (min) (ACUTE ONLY): 25 min  Charges:    $Gait Training: 23-37 mins PT General Charges $$ ACUTE PT VISIT: 1 Visit                     Leita Sable, PT, DPT Acute Rehabilitation Services Secure Chat Preferred Office: [phone]    Leita JONETTA Sable 09/01/2024, 4:10 PM

## 2024-09-01 NOTE — TOC Initial Note (Signed)
 Transition of Care [facility]) - Initial/Assessment Note    Patient Details  Name: Samantha Mcbride MRN: [id number] Date of Birth: 1951/01/16  Transition of Care [facility]) CM/SW Contact:    Samantha Mcbride, Samantha Mcbride Phone Number: 09/01/2024, 2:30 PM  Clinical Narrative:                 Pt admitted from home due to lower back pain. Pt Ox4- CSW completed SNF workup with pt at bedside. Pt stated she lives alone but her daughter lives just down the street and works from home and is able to assist her everyday. Pt stated daughter works from home at pts house for half the day to help her out. Pt agreeable to SNF and states Samantha Mcbride is her first choice. Pt also stated she uses a rollator at home. CSW completed Fl2 and sent out SNF referrals. CSW will follow up to provide bed offers.  Expected Discharge Plan: Skilled Nursing Facility Barriers to Discharge: Insurance Authorization, SNF Pending bed offer, Continued Medical Work up   Patient Goals and CMS Choice Patient states their goals for this hospitalization and ongoing recovery are:: SNF          Expected Discharge Plan and Services       Living arrangements for the past 2 months: Single Family Home                                      Prior Living Arrangements/Services Living arrangements for the past 2 months: Single Family Home Lives with:: Self Patient language and need for interpreter reviewed:: Yes Do you feel safe going back to the place where you live?: Yes      Need for Family Participation in Patient Care: No (Comment) Care giver support system in place?: Yes (comment)   Criminal Activity/Legal Involvement Pertinent to Current Situation/Hospitalization: No - Comment as needed  Activities of Daily Living   ADL Screening (condition at time of admission) Independently performs ADLs?: No Does the patient have a NEW difficulty with bathing/dressing/toileting/self-feeding that is expected to last >3 days?: No Does the  patient have a NEW difficulty with getting in/out of bed, walking, or climbing stairs that is expected to last >3 days?: No Does the patient have a NEW difficulty with communication that is expected to last >3 days?: No Is the patient deaf or have difficulty hearing?: No Does the patient have difficulty seeing, even when wearing glasses/contacts?: No  Permission Sought/Granted Permission sought to share information with : Facility Medical Sales Representative, Family Supports    Share Information with NAME: Samantha Mcbride  Permission granted to share info w AGENCY: SNFs  Permission granted to share info w Relationship: Daughter  Permission granted to share info w Contact Information: [phone]  Emotional Assessment Appearance:: Appears stated age Attitude/Demeanor/Rapport: Engaged Affect (typically observed): Pleasant Orientation: : Oriented to Self, Oriented to Place, Oriented to  Time, Oriented to Situation Alcohol / Substance Use: Not Applicable Psych Involvement: No (comment)  Admission diagnosis:  Lower extremity weakness [R29.898] Weakness of left lower extremity [R29.898] Spondylolisthesis at L4-L5 level [M43.16] Fever, unspecified fever cause [R50.9] Acute left-sided low back pain with left-sided sciatica [M54.42] Incontinence of feces, unspecified fecal incontinence type [R15.9] S/P lumbar laminectomy [Z98.890] Patient Active Problem List   Diagnosis Date Noted   S/P lumbar laminectomy 08/30/2024   Lower extremity weakness 08/29/2024   Spondylolisthesis at L4-L5 level 08/29/2024   Leukocytosis 08/29/2024  Low grade fever 08/29/2024   History of breast cancer 08/29/2024   Ischemic cardiomyopathy 07/14/2024   Hip fracture (HCC) 10/18/2022   Intertrochanteric fracture of left hip (HCC) 10/17/2022   Breast cancer (HCC) 10/03/2021   Osteoarthritis 10/03/2021   Osteoporosis 07/10/2021   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 05/11/2021    Age-related osteoporosis without current pathological fracture 02/02/2021   Coronary artery disease 02/02/2021   Chronic kidney disease, stage 4 (severe) (HCC) 02/02/2021   Insomnia 02/02/2021   Localized, primary osteoarthritis of hand 02/02/2021   Osteopenia 02/02/2021   Restless legs 02/02/2021   Rheumatoid arthritis (HCC) 02/02/2021   Sciatica 02/02/2021   Vitamin D  deficiency 02/02/2021   Genetic testing 07/15/2020   Preoperative cardiovascular examination 07/11/2020   Family history of breast cancer    Family history of skin cancer    Malignant neoplasm of upper-outer quadrant of right breast in female, estrogen receptor positive (HCC) 06/15/2020   Pain in left knee 05/28/2019   Ureteral calculus 05/11/2019   Obesity (BMI 30-39.9) 07/18/2013   Essential hypertension    Hyperlipidemia with target low density lipoprotein (LDL) cholesterol less than 55 mg/dL 89/97/7985   Glucose intolerance (impaired glucose tolerance) 07/16/2013   Ventricular tachycardia, sustained; Peri-infarct.  No further episodes 08/28/2011   Hypokalemia 08/27/2011   H/O Anterior STEMI:  Proximal LAD  insertion of a 3.5x24 mm Promus element DES (08/2011) 08/27/2011   CAD S/P percutaneous coronary angioplasty 08/16/2011   PCP:  Samantha Lenis, MD Pharmacy:   [facility] [phone] Samantha Mcbride, [facility] [address] [place] [address] [place] Samantha Mcbride [address] Phone: [phone] Fax: [phone]     Social Drivers of Health (SDOH) Social History: SDOH Screenings   Food Insecurity: Food Insecurity Present (08/29/2024)  Housing: Low Risk  (08/29/2024)  Transportation Needs: No Transportation Needs (08/29/2024)  Utilities: Not At Risk (08/29/2024)  Social Connections: Socially Isolated (08/29/2024)  Tobacco Use: Medium Risk (08/30/2024)   SDOH Interventions:     Readmission Risk Interventions     No data to display

## 2024-09-01 NOTE — Progress Notes (Signed)
 PROGRESS NOTE    Samantha Mcbride  FMW:[id number] DOB: August 15, 1951 DOA: 08/29/2024 PCP: Samantha Lenis, MD   Brief Narrative:  Samantha Mcbride 73 year old female who presented for worsening back pain and left leg weakness with reports of inability to ambulate.  Found to have concern for grade 1 anterolisthesis at L4-5 with severe spinal stenosis most recent imaging study for which neurosurgery admitted the patient and plan on possible intervention.  Rechecking MRI at this time and it was done without contrast but was reviewed by neurosurgery who felt that she has small abscesses within the muscle at L4-L5 and probable septic arthritis with epidural abscess with severe spinal stenosis.  Neurosurgery recommended L4-L5 hemilaminectomy and she underwent this along with a medial facetectomy foraminotomies with sublaminar decompression for central and right lateral recess decompression and is POD2.  PT/OT recommending SNF and she is stable from a medical standpoint .    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  Patient presents with worsening back pain and left foot drop.  Recent imaging showing grade 1 anterolisthesis at L4-5 with severe spinal stenosis.  Currently given Decadron  10 mg IV.   -MRI lumbar spine without contrast done and showed New 19 x 7 mm fluid collection posterior to the thecal sac at L4-L5, unlikely a synovial cyst since it was not present on the prior recent MRI, it could be a liquified hematoma or abscess but is certainly contributing to the patient's tight stenosis; Notably there was also Severe spinal canal and bilateral lateral recess stenosis at L4-L5 due to diffuse disc bulge, advanced facet arthropathy, and ligamentum flavum thickening and Stable posterior cystic structures at L5-S1, likely dissecting synovial cysts. -Neurosurgery evaluated the  patient for surgical intervention and she is status post left L4-L5 hemilaminectomy, medial facetectomy foraminotomies with sublaminar decompression for central and right lateral recess decompression -She will continue to follow on antibiotic course and will consider consulting ID pending on results. -Follow up on Aerobic and Aerobic/Anaerobic Cx and both pending  -Continued antibiotics with IV Ceftriaxone  2 g every 12 and IV Vancomycin 750 mg every 48 hours but now IV Vancomycin is discontinued and just remains on CTX Monotherapy -Pain control with Acetaminophen  1000 mg p.o. Q6h scheduled, Hydrocodone -Acetaminophen  7.5-325 mg 1 tablet p.o. every 6 scheduled, and IV morphine  2 mg Q2 as needed for severe pain: Continue with supportive care with ondansetron  4 mg p.o./IV every 6 as needed nausea vomiting and bowel regimen senna 8.6 mg p.o. twice daily -Also continue with Tizanidine 2 mg p.o. every 8 as needed for muscle spasms -Will need PT/OT to Evaluate and Treat and they are recommending SNF; Neurosurgery feels that the foot drop is improving and they recommended continue work with therapy and obtaining TOC consult for rehab placement   Leukocytosis and Low-grade fever, Improved  -Acute.  Patient noted to have a low-grade fever 100.1 F and WBC elevated at 15.3 on admission. This could possibly be secondary to atelectasis she has been in the bed here last couple of days versus possibility of underlying infection from above vs UTI. -Incentive spirometry and flutter valve -WBC went from 15.3 -> 12.9 -> 11.2 -> 8.7.   -Follow-up ESR was 47 and CRP was 3.5 -Blood Culture x2 showed NGTD @ 3 Days ; Aerobic Cx w/ Gram Stain Negative @ 2 Days - Check chest x-ray and U/A showed a cloudy appearance with small  hemoglobin, large leukocytes, positive nitrites, rare bacteria, 11-20 RBCs per high-power field and 50 WBCs with urine culture not being obtained. -Chest x-ray done and showed No acute cardiopulmonary  process but did show Stable ossific density along the shoulder. -CTM Cx and Temperature Curve   Hypokalemia: Mild and improved as K+ went from 3.1 -> 4.1 -> 3.9 -> 3.8. CTM and Replete as necessary. Mag Level is now 2.2. CTM and Replete as Necessary. Repeat CMP in the AM  ? Urinary Retention  Chronic Kidney Disease Stage IV / Metabolic Acidosis Patient noted to have some suprapubic tenderness on palpation on physical exam.  Creatinine noted to be 1.98 with BUN 20.  This appears to be around patient's baseline 1.6-2. BUN/Cr Trend fairly stable: Recent Labs  Lab 08/29/24 1141 08/29/24 1219 08/30/24 1416 08/31/24 0256  BUN 20 22 23 23   CREATININE 1.98* 2.10* 1.86* 1.82*  -Check serial bladder scans for concern for urinary retention -MA is improved and CO2 is now 22, AG is 14, and Chloride Level is 104 -In-N-Out cath as needed/orders placed for Foley catheter persistent urinary retention -U/A as above -Avoid Nephrotoxic Medications, Contrast Dyes, Hypotension and Dehydration to Ensure Adequate Renal Perfusion and will need to Renally Adjust Meds. CTM and Trend Renal Function carefully and repeat CMP in the AM   Essential Hypertension: Blood pressures currently maintained. Continue Carvedilol  12.5 mg po BID. CTM BP per Protocol. Last BP reading was 114/65   COPD: No significant wheezes or rhonchi appreciated on physical exam at this time. -Breathing treatments as needed w/ Albuterol 2.5 mg Neb q4hprn Wheezing and SOB   Coronary Artery Disease Hyperlipidemia Patient with a history of STEMI.  No anginal symptoms reported. -Hold Clopidogrel  given need for Surgical Intervention -Continue Beta-blocker w/ Carvedilol  12.5 mg po BID, Ezetimibe  10 mg po Daily, and Rosuvastatin  20 mg po Daily    Rheumatoid Arthritis:  Continue Hydroxychloroquine  200 mg po daily and Leflunomide  20 mg po daily    History of Breast Cancer: Noted  Normocytic Anemia: Hgb/Hct Trend:  Recent Labs  Lab 08/29/24 1141  08/29/24 1219 08/30/24 1416 08/31/24 0256  HGB 11.9* 11.6* 10.0* 9.1*  HCT 37.5 34.0* 31.9* 28.9*  MCV 94.2  --  96.4 96.7  -Checked Anemia Panel and showed an iron level 64, UIBC 125, TIBC 199, saturation ratios of 34, ferritin level 224, folate of 19.2 and a vitamin B12 of 527. CTM for S/Sx of Bleeding; No overt bleeding noted. Repeat CBC in the AM  Hypoalbuminemia: Patient's Albumin  Lvl went from 3.2 -> 2.7 -> 2.3 -> 2.8. CTM and Trend and repeat CMP in the AM  Class I Obesity: Complicates overall prognosis and care. Estimated body mass index is 33.07 kg/m as calculated from the following:   Height as of 08/04/24: 5' 1 (1.549 m).   Weight as of 08/04/24: 79.4 kg. Weight Loss and Dietary Counseling given   DVT prophylaxis: SCD's Start: 08/30/24 1148    Code Status: Full Code Family Communication: No family present @ bedside  Disposition Plan:  Level of care: Med-Surg Status is: Inpatient Remains inpatient appropriate because: Awaiting SNF placement given that she is medically stable   Consultants:  Neurosurgery (primary) TR[facility]  Procedures:  As delineated as above  Antimicrobials:  Anti-infectives (From admission, onward)    Start     Dose/Rate Route Frequency Ordered Stop   09/01/24 1000  vancomycin (VANCOREADY) IVPB 750 mg/150 mL  Status:  Discontinued        750 mg  150 mL/hr over 60 Minutes Intravenous Every 48 hours 08/30/24 1258 08/31/24 1355   09/01/24 1000  cefTRIAXone  (ROCEPHIN ) 2 g in sodium chloride  0.9 % 100 mL IVPB        2 g 200 mL/hr over 30 Minutes Intravenous Every 24 hours 08/31/24 1324     08/30/24 1345  vancomycin (VANCOREADY) IVPB 750 mg/150 mL        750 mg 150 mL/hr over 60 Minutes Intravenous STAT 08/30/24 1258 08/30/24 1448   08/30/24 1245  cefTRIAXone  (ROCEPHIN ) 2 g in sodium chloride  0.9 % 100 mL IVPB  Status:  Discontinued        2 g 200 mL/hr over 30 Minutes Intravenous Every 12 hours 08/30/24 1147 08/31/24 1324   08/30/24 0915  vancomycin  (VANCOCIN) IVPB 1000 mg/200 mL premix        1,000 mg 200 mL/hr over 60 Minutes Intravenous  Once 08/30/24 0909 08/30/24 1120   08/30/24 0915  cefTRIAXone  (ROCEPHIN ) 1 g in sodium chloride  0.9 % 100 mL IVPB        1 g 200 mL/hr over 30 Minutes Intravenous  Once 08/30/24 0909 08/30/24 1844   08/29/24 2015  hydroxychloroquine  (PLAQUENIL ) tablet 200 mg        200 mg Oral Daily 08/29/24 1920         Subjective: Seen and examined at bedside and sitting on chair and feels okay.  He was having some pain.  No lightheadedness or dizziness.  Did not sleep well while she did the night before.  No other concerns or complaints this time.  Objective: Vitals:   08/31/24 2303 09/01/24 0208 09/01/24 0505 09/01/24 0925  BP: (!) 111/58 109/63 136/66 114/65  Pulse: 69 74 77 82  Resp: 18 16 16    Temp: 98.2 F (36.8 C) 97.7 F (36.5 C) 98.2 F (36.8 C)   TempSrc: Oral Oral Oral   SpO2: 94% 95% 94%     Intake/Output Summary (Last 24 hours) at 09/01/2024 1339 Last data filed at 09/01/2024 0500 Gross per 24 hour  Intake --  Output 300 ml  Net -300 ml   There were no vitals filed for this visit.  Examination: Physical Exam:  Constitutional: WN/WD obese chronically ill-appearing Caucasian female sitting in a chair in no acute distress Respiratory: Diminished to auscultation bilaterally, no wheezing, rales, rhonchi or crackles. Normal respiratory effort and patient is not tachypenic. No accessory muscle use.  Unlabored breathing Cardiovascular: RRR, no murmurs / rubs / gallops. S1 and S2 auscultated. No extremity edema.  Abdomen: Soft, non-tender, n distended secondary to body habitus. Bowel sounds positive.  GU: Deferred. Musculoskeletal: No clubbing / cyanosis of digits/nails. No joint deformity upper and lower extremities. Skin: No rashes, lesions, ulcers on limited skin evaluation. No induration; Warm and dry.  Neurologic: CN 2-12 grossly intact with no focal deficits. Romberg sign and  cerebellar reflexes not assessed.  Psychiatric: Normal judgment and insight. Alert and oriented x 3. Normal mood and appropriate affect.   Data Reviewed: I have personally reviewed following labs and imaging studies  CBC: Recent Labs  Lab 08/29/24 1141 08/29/24 1219 08/30/24 1416 08/31/24 0256 09/01/24 0543  WBC 15.3*  --  12.9* 11.2* 8.7  NEUTROABS 13.3*  --  11.8* 9.8* 6.8  HGB 11.9* 11.6* 10.0* 9.1* 10.4*  HCT 37.5 34.0* 31.9* 28.9* 32.7*  MCV 94.2  --  96.4 96.7 95.9  PLT 180  --  180 180 196   Basic Metabolic Panel: Recent Labs  Lab  08/29/24 1141 08/29/24 1219 08/30/24 1416 08/31/24 0256 09/01/24 0543  NA 141 138 141 141 140  K 3.2* 3.1* 4.1 3.9 3.8  CL 105 109 109 108 104  CO2 23  --  21* 22 22  GLUCOSE 106* 103* 154* 185* 88  BUN 20 22 23 23  27*  CREATININE 1.98* 2.10* 1.86* 1.82* 2.11*  CALCIUM  9.2  --  8.3* 8.1* 8.6*  MG  --   --  2.4 2.2 2.4  PHOS  --   --  2.6 2.9 3.0   GFR: CrCl cannot be calculated (Unknown ideal weight.). Liver Function Tests: Recent Labs  Lab 08/29/24 1141 08/30/24 1416 08/31/24 0256 09/01/24 0543  AST 15 16 14* 13*  ALT 10 10 9 10   ALKPHOS 56 48 45 48  BILITOT 0.3 0.5 0.3 0.4  PROT 6.2* 5.5* 4.9* 5.4*  ALBUMIN  3.2* 2.7* 2.3* 2.8*   No results for input(s): LIPASE, AMYLASE in the last 168 hours. No results for input(s): AMMONIA in the last 168 hours. Coagulation Profile: No results for input(s): INR, PROTIME in the last 168 hours. Cardiac Enzymes: No results for input(s): CKTOTAL, CKMB, CKMBINDEX, TROPONINI in the last 168 hours. BNP (last 3 results) No results for input(s): PROBNP in the last 8760 hours. HbA1C: No results for input(s): HGBA1C in the last 72 hours. CBG: No results for input(s): GLUCAP in the last 168 hours. Lipid Profile: No results for input(s): CHOL, HDL, LDLCALC, TRIG, CHOLHDL, LDLDIRECT in the last 72 hours. Thyroid  Function Tests: No results for input(s):  TSH, T4TOTAL, FREET4, T3FREE, THYROIDAB in the last 72 hours. Anemia Panel: Recent Labs    09/01/24 0543  VITAMINB12 527  FOLATE 19.2  FERRITIN 224  TIBC 189*  IRON 64  RETICCTPCT 1.7   Sepsis Labs: No results for input(s): PROCALCITON, LATICACIDVEN in the last 168 hours.  Recent Results (from the past 240 hours)  Resp panel by RT-PCR (RSV, Flu A&B, Covid) Anterior Nasal Swab     Status: None   Collection Time: 08/29/24  3:32 PM   Specimen: Anterior Nasal Swab  Result Value Ref Range Status   SARS Coronavirus 2 by RT PCR NEGATIVE NEGATIVE Final   Influenza A by PCR NEGATIVE NEGATIVE Final   Influenza B by PCR NEGATIVE NEGATIVE Final    Comment: (NOTE) The Xpert Xpress SARS-CoV-2/FLU/RSV plus assay is intended as an aid in the diagnosis of influenza from Nasopharyngeal swab specimens and should not be used as a sole basis for treatment. Nasal washings and aspirates are unacceptable for Xpert Xpress SARS-CoV-2/FLU/RSV testing.  Fact Sheet for Patients: bloggercourse.com  Fact Sheet for Healthcare Providers: seriousbroker.it  This test is not yet approved or cleared by the [place]  FDA and has been authorized for detection and/or diagnosis of SARS-CoV-2 by FDA under an Emergency Use Authorization (EUA). This EUA will remain in effect (meaning this test can be used) for the duration of the COVID-19 declaration under Section 564(b)(1) of the Act, 21 U.S.C. section 360bbb-3(b)(1), unless the authorization is terminated or revoked.     Resp Syncytial Virus by PCR NEGATIVE NEGATIVE Final    Comment: (NOTE) Fact Sheet for Patients: bloggercourse.com  Fact Sheet for Healthcare Providers: seriousbroker.it  This test is not yet approved or cleared by the [place]  FDA and has been authorized for detection and/or diagnosis of SARS-CoV-2 by FDA under an  Emergency Use Authorization (EUA). This EUA will remain in effect (meaning this test can be used) for the duration of the COVID-19  declaration under Section 564(b)(1) of the Act, 21 U.S.C. section 360bbb-3(b)(1), unless the authorization is terminated or revoked.  Performed at [facility], [address] [place], [place] [address]   Culture, blood (Routine X 2) w Reflex to ID Panel     Status: None (Preliminary result)   Collection Time: 08/29/24  6:48 PM   Specimen: BLOOD LEFT HAND  Result Value Ref Range Status   Specimen Description BLOOD LEFT HAND  Final   Special Requests   Final    BOTTLES DRAWN AEROBIC AND ANAEROBIC Blood Culture results may not be optimal due to an inadequate volume of blood received in culture bottles   Culture   Final    NO GROWTH 3 DAYS Performed at [facility], [address] [place], [place] [address]    Report Status PENDING  Incomplete  Culture, blood (Routine X 2) w Reflex to ID Panel     Status: None (Preliminary result)   Collection Time: 08/29/24  6:48 PM   Specimen: BLOOD  Result Value Ref Range Status   Specimen Description BLOOD  Final   Special Requests   Final    AEROBIC BOTTLE ONLY Blood Culture results may not be optimal due to an inadequate volume of blood received in culture bottles   Culture   Final    NO GROWTH 3 DAYS Performed at [facility], [address], [place], [place] [address]    Report Status PENDING  Incomplete  Surgical PCR screen     Status: None   Collection Time: 08/29/24 10:55 PM   Specimen: Nasal Mucosa; Nasal Swab  Result Value Ref Range Status   MRSA, PCR NEGATIVE NEGATIVE Final   Staphylococcus aureus NEGATIVE NEGATIVE Final    Comment: (NOTE) The Xpert SA Assay (FDA approved for NASAL specimens in patients 69 years of age and older), is one component of a comprehensive surveillance program. It is not intended to diagnose infection nor to guide or monitor treatment. Performed at [facility], [address] [place], [place] [address]   Aerobic/Anaerobic Culture w Gram Stain (surgical/deep wound)     Status: None (Preliminary result)   Collection Time: 08/30/24 10:01 AM   Specimen: Soft Tissue, Other  Result Value Ref Range Status   Specimen Description TISSUE  Final   Special Requests LUMBAR EPIDURAL TISSUE  Final   Gram Stain NO WBC SEEN NO ORGANISMS SEEN   Final   Culture   Final    NO GROWTH 2 DAYS NO ANAEROBES ISOLATED; CULTURE IN PROGRESS FOR 5 DAYS Performed at [facility], [address], [place], [place] [address]    Report Status PENDING  Incomplete  Aerobic Culture w Gram Stain (superficial specimen)     Status: None   Collection Time: 08/30/24 10:05 AM   Specimen: Wound  Result Value Ref Range Status   Specimen Description WOUND  Final   Special Requests LUMBAR EPIDURAL SPACE  Final   Gram Stain NO WBC SEEN NO ORGANISMS SEEN   Final   Culture   Final    NO GROWTH 2 DAYS Performed at [facility], [address] [place], [place] [address]    Report Status 09/01/2024 FINAL  Final    Radiology Studies: No results found.  Scheduled Meds:  carvedilol   12.5 mg Oral BID WC   ezetimibe   10 mg Oral Daily   gabapentin   300 mg Oral QPM   HYDROcodone -acetaminophen   1 tablet Oral Q6H   hydroxychloroquine   200 mg Oral Daily   leflunomide   20 mg Oral Daily   melatonin  5 mg Oral QPM   multivitamin with minerals  1 tablet Oral Daily   rosuvastatin   20 mg Oral Daily   senna  1 tablet Oral BID   sodium chloride  flush  3 mL Intravenous Q12H   traZODone   100 mg Oral QHS   Continuous Infusions:  0.9 % NaCl with KCl 20 mEq / L     cefTRIAXone  (ROCEPHIN )  IV 2 g (09/01/24 0938)    LOS: 3 days   Alejandro Marker, DO [facility] Available via Epic secure chat 7am-7pm After these hours, please refer to coverage provider listed on [email or web address] 09/01/2024, 1:39 PM

## 2024-09-01 NOTE — Plan of Care (Signed)

## 2024-09-01 NOTE — Progress Notes (Signed)
 Patient ID: Samantha Mcbride, female   DOB: 07/26/1951, 73 y.o.   MRN: [id number] Subjective: Patient reports some back soreness, no leg pain  Objective: Vital signs in last 24 hours: Temp:  [97.7 F (36.5 C)-98.2 F (36.8 C)] 98.2 F (36.8 C) (11/18 0505) Pulse Rate:  [69-77] 77 (11/18 0505) Resp:  [16-18] 16 (11/18 0505) BP: (109-136)/(57-70) 136/66 (11/18 0505) SpO2:  [94 %-95 %] 94 % (11/18 0505)  Intake/Output from previous day: 11/17 0701 - 11/18 0700 In: -  Out: 300 [Urine:300] Intake/Output this shift: No intake/output data recorded.  Neurologic: Grossly normal with 2/5 L DF (improved) and 4-/5 R DF  Lab Results: Lab Results  Component Value Date   WBC 8.7 09/01/2024   HGB 10.4 (L) 09/01/2024   HCT 32.7 (L) 09/01/2024   MCV 95.9 09/01/2024   PLT 196 09/01/2024   Lab Results  Component Value Date   INR 1.0 10/17/2022   BMET Lab Results  Component Value Date   NA 140 09/01/2024   K 3.8 09/01/2024   CL 104 09/01/2024   CO2 22 09/01/2024   GLUCOSE 88 09/01/2024   BUN 27 (H) 09/01/2024   CREATININE 2.11 (H) 09/01/2024   CALCIUM  8.6 (L) 09/01/2024    Studies/Results: DG Lumbar Spine 1 View Result Date: 08/30/2024 CLINICAL DATA:  Lumbar laminectomy and decompression with micro discectomy. EXAM: LUMBAR SPINE - 1 VIEW COMPARISON:  Lumbar MRI 08/29/2024.  Lumbar CT 08/04/2024. FINDINGS: Three cross-table lateral views of the lumbar spine are submitted from the operating room. On the initial image at 0914 hours, there is a localizing needle posteriorly between the L3 and L4 spinous processes. On the 2nd view, there is a needle more inferiorly overlying the inferior aspect of the L4 spinous process. The final view at 0935 hours demonstrates skin spreaders and surgical instruments posteriorly at L4-5. IMPRESSION: Intraoperative localization of the L4-5 level. Electronically Signed   By: Elsie Perone M.D.   On: 08/30/2024 15:21    Assessment/Plan: Doing well, xcs  neg, rocephin  for UTI, PT/Ot and await SNF  Estimated body mass index is 33.07 kg/m as calculated from the following:   Height as of 08/04/24: 5' 1 (1.549 m).   Weight as of 08/04/24: 79.4 kg.    LOS: 3 days    Alm GORMAN Molt 09/01/2024, 8:43 AM

## 2024-09-01 NOTE — Plan of Care (Signed)
  Problem: Clinical Measurements: Goal: Will remain free from infection Outcome: Progressing   Problem: Nutrition: Goal: Adequate nutrition will be maintained Outcome: Progressing   Problem: Safety: Goal: Ability to remain free from injury will improve Outcome: Progressing   Problem: Education: Goal: Knowledge of the prescribed therapeutic regimen will improve Outcome: Progressing

## 2024-09-02 DIAGNOSIS — D72829 Elevated white blood cell count, unspecified: Secondary | ICD-10-CM | POA: Diagnosis not present

## 2024-09-02 DIAGNOSIS — M4316 Spondylolisthesis, lumbar region: Secondary | ICD-10-CM | POA: Diagnosis not present

## 2024-09-02 DIAGNOSIS — R509 Fever, unspecified: Secondary | ICD-10-CM | POA: Diagnosis not present

## 2024-09-02 DIAGNOSIS — E876 Hypokalemia: Secondary | ICD-10-CM | POA: Diagnosis not present

## 2024-09-02 LAB — COMPREHENSIVE METABOLIC PANEL WITH GFR
ALT: 10 U/L (ref 0–44)
AST: 12 U/L — ABNORMAL LOW (ref 15–41)
Albumin: 2.5 g/dL — ABNORMAL LOW (ref 3.5–5.0)
Alkaline Phosphatase: 47 U/L (ref 38–126)
Anion gap: 10 (ref 5–15)
BUN: 26 mg/dL — ABNORMAL HIGH (ref 8–23)
CO2: 24 mmol/L (ref 22–32)
Calcium: 8.2 mg/dL — ABNORMAL LOW (ref 8.9–10.3)
Chloride: 108 mmol/L (ref 98–111)
Creatinine, Ser: 2.25 mg/dL — ABNORMAL HIGH (ref 0.44–1.00)
GFR, Estimated: 23 mL/min — ABNORMAL LOW (ref >60)
Glucose, Bld: 92 mg/dL (ref 70–99)
Potassium: 3.8 mmol/L (ref 3.5–5.1)
Sodium: 142 mmol/L (ref 135–145)
Total Bilirubin: 0.5 mg/dL (ref 0.0–1.2)
Total Protein: 5.1 g/dL — ABNORMAL LOW (ref 6.5–8.1)

## 2024-09-02 LAB — CBC WITH DIFFERENTIAL/PLATELET
Abs Immature Granulocytes: 0.07 K/uL (ref 0.00–0.07)
Basophils Absolute: 0 K/uL (ref 0.0–0.1)
Basophils Relative: 0 %
Eosinophils Absolute: 0.1 K/uL (ref 0.0–0.5)
Eosinophils Relative: 1 %
HCT: 31.4 % — ABNORMAL LOW (ref 36.0–46.0)
Hemoglobin: 9.7 g/dL — ABNORMAL LOW (ref 12.0–15.0)
Immature Granulocytes: 1 %
Lymphocytes Relative: 14 %
Lymphs Abs: 1.1 K/uL (ref 0.7–4.0)
MCH: 29.8 pg (ref 26.0–34.0)
MCHC: 30.9 g/dL (ref 30.0–36.0)
MCV: 96.3 fL (ref 80.0–100.0)
Monocytes Absolute: 0.6 K/uL (ref 0.1–1.0)
Monocytes Relative: 7 %
Neutro Abs: 6.4 K/uL (ref 1.7–7.7)
Neutrophils Relative %: 77 %
Platelets: 172 K/uL (ref 150–400)
RBC: 3.26 MIL/uL — ABNORMAL LOW (ref 3.87–5.11)
RDW: 13.3 % (ref 11.5–15.5)
WBC: 8.3 K/uL (ref 4.0–10.5)
nRBC: 0 % (ref 0.0–0.2)

## 2024-09-02 LAB — PHOSPHORUS: Phosphorus: 3 mg/dL (ref 2.5–4.6)

## 2024-09-02 LAB — MAGNESIUM: Magnesium: 2.3 mg/dL (ref 1.7–2.4)

## 2024-09-02 MED ORDER — SODIUM CHLORIDE 0.9% FLUSH: 10.0000 mL | INTRAVENOUS | Status: DC | PRN

## 2024-09-02 NOTE — Progress Notes (Signed)
 Subjective: Patient reports doing well, back is feeling better and so are her legs.   Objective: Vital signs in last 24 hours: Temp:  [97.8 F (36.6 C)-98.6 F (37 C)] 98 F (36.7 C) (11/19 1027) Pulse Rate:  [74-84] 84 (11/19 1027) Resp:  [16-18] 16 (11/19 1027) BP: (109-139)/(53-70) 116/70 (11/19 1027) SpO2:  [92 %-94 %] 93 % (11/19 1027)  Intake/Output from previous day: 11/18 0701 - 11/19 0700 In: 947 [P.O.:240; I.V.:607; IV Piggyback:100] Out: -  Intake/Output this shift: No intake/output data recorded.  Still left foot drop but improving, 3/5 strength  Lab Results: Lab Results  Component Value Date   WBC 8.3 09/02/2024   HGB 9.7 (L) 09/02/2024   HCT 31.4 (L) 09/02/2024   MCV 96.3 09/02/2024   PLT 172 09/02/2024   Lab Results  Component Value Date   INR 1.0 10/17/2022   BMET Lab Results  Component Value Date   NA 142 09/02/2024   K 3.8 09/02/2024   CL 108 09/02/2024   CO2 24 09/02/2024   GLUCOSE 92 09/02/2024   BUN 26 (H) 09/02/2024   CREATININE 2.25 (H) 09/02/2024   CALCIUM  8.2 (L) 09/02/2024    Studies/Results: No results found.  Assessment/Plan: Postop day 3 lumbar laminectomy, doing better. Continue therapy. Appreciate social work's help with placement.   LOS: 4 days    Samantha Mcbride 09/02/2024, 11:27 AM

## 2024-09-02 NOTE — Progress Notes (Deleted)
 Orthopedic Tech Progress Note Patient Details:  Samantha Mcbride [facility] Feb 07, 1951 [id number]  Ortho Devices Type of Ortho Device: ASO Ortho Device/Splint Location: LLE Ortho Device/Splint Interventions: Ordered   Called into [facility].    Samantha Mcbride Samantha Mcbride 09/02/2024, 12:21 PM

## 2024-09-02 NOTE — Progress Notes (Signed)
 Physical Therapy Treatment  Patient Details Name: Samantha Mcbride MRN: [id number] DOB: 05/20/1951 Today's Date: 09/02/2024   History of Present Illness Pt is a 73 y/o female who presents s/p L L4-5 hemilaminectomy, medial facetectomy foraminotomies with sublaminar decompression for central and right lateral recess decompression on 08/30/2024. PMH significant for CA, CKD IV, HTN, RA, DOE, IM nail L 2024.    PT Comments  Pt progressing with post-op mobility. Continues to require frequent seated rest breaks during gait training, however overall improved this session with tolerance for functional activity. Pt was able to demonstrate transfers and ambulation with gross CGA and RW for support. Reinforced education on precautions, appropriate activity progression, and positioning recommendations. Will continue to follow.      If plan is discharge home, recommend the following: A little help with walking and/or transfers;A little help with bathing/dressing/bathroom;Assistance with cooking/housework;Assist for transportation;Help with stairs or ramp for entrance   Can travel by private vehicle     Yes  Equipment Recommendations  Rolling walker (2 wheels)    Recommendations for Other Services       Precautions / Restrictions Precautions Precautions: Back;Fall Precaution Booklet Issued: Yes (comment) Recall of Precautions/Restrictions: Intact Precaution/Restrictions Comments: Reviewed handout and pt was cued for precautions during functional mobility. Restrictions Weight Bearing Restrictions Per Provider Order: No     Mobility  Bed Mobility Overal bed mobility: Needs Assistance Bed Mobility: Rolling, Sidelying to Sit Rolling: Supervision Sidelying to sit: Contact guard assist       General bed mobility comments: HOB elevated significantly. VC's for log roll. Pt able to complete without assist however increased time needed.    Transfers Overall transfer level: Needs  assistance Equipment used: Rolling walker (2 wheels) Transfers: Sit to/from Stand Sit to Stand: Contact guard assist   Step pivot transfers: Contact guard assist       General transfer comment: VC's for hand placement on seated surface for safety. Hands on guarding for safety.    Ambulation/Gait Ambulation/Gait assistance: Contact guard assist, Min assist Gait Distance (Feet): 50 Feet (25'+25'+25'+50'+35') Assistive device: Rolling walker (2 wheels) Gait Pattern/deviations: Step-through pattern, Decreased stride length, Trunk flexed, Shuffle Gait velocity: Decreased Gait velocity interpretation: <1.8 ft/sec, indicate of risk for recurrent falls   General Gait Details: Pt with decreased floor clearance and demonstrating short, shuffling steps. Heel strike deficits L>R. Very slow pace. Continues to require frequent seated rest breaks however overall improved this session with tolerance for functional activity.   Stairs             Wheelchair Mobility     Tilt Bed    Modified Rankin (Stroke Patients Only)       Balance Overall balance assessment: Needs assistance Sitting-balance support: Single extremity supported, Feet supported Sitting balance-Leahy Scale: Fair Sitting balance - Comments: Pt able to sit EOB with close supervision Postural control: Posterior lean Standing balance support: Bilateral upper extremity supported, During functional activity, Reliant on assistive device for balance Standing balance-Leahy Scale: Poor Standing balance comment: Dependent on RW and external support                            Communication Communication Communication: No apparent difficulties  Cognition Arousal: Alert Behavior During Therapy: WFL for tasks assessed/performed   PT - Cognitive impairments: No apparent impairments                         Following  commands: Intact      Cueing Cueing Techniques: Verbal cues, Gestural cues   Exercises      General Comments General comments (skin integrity, edema, etc.): Pt unable to attempt standing hygiene with 1UE support on RW after toileting at [facility].      Pertinent Vitals/Pain Pain Assessment Pain Assessment: Faces Faces Pain Scale: Hurts little more Pain Location: back and L upper arm Pain Descriptors / Indicators: Grimacing, Operative site guarding, Sore, Burning Pain Intervention(s): Limited activity within patient's tolerance, Monitored during session, Repositioned    Home Living                          Prior Function            PT Goals (current goals can now be found in the care plan section) Acute Rehab PT Goals Patient Stated Goal: Return home after SNF stay PT Goal Formulation: With patient Time For Goal Achievement: 09/14/24 Potential to Achieve Goals: Good Progress towards PT goals: Progressing toward goals    Frequency    Min 5X/week      PT Plan      Co-evaluation              AM-PAC PT 6 Clicks Mobility   Outcome Measure  Help needed turning from your back to your side while in a flat bed without using bedrails?: A Little Help needed moving from lying on your back to sitting on the side of a flat bed without using bedrails?: A Little Help needed moving to and from a bed to a chair (including a wheelchair)?: A Little Help needed standing up from a chair using your arms (e.g., wheelchair or bedside chair)?: A Little Help needed to walk in hospital room?: A Little Help needed climbing 3-5 steps with a railing? : A Lot 6 Click Score: 17    End of Session Equipment Utilized During Treatment: Gait belt Activity Tolerance: Patient limited by fatigue Patient left: in bed;with call bell/phone within reach;with family/visitor present Nurse Communication: Mobility status PT Visit Diagnosis: Unsteadiness on feet (R26.81);Pain Pain - part of body:  (back, upper arm)     Time: 8653-8582 PT Time Calculation (min)  (ACUTE ONLY): 31 min  Charges:    $Gait Training: 23-37 mins PT General Charges $$ ACUTE PT VISIT: 1 Visit                     Leita Sable, PT, DPT Acute Rehabilitation Services Secure Chat Preferred Office: [phone]    Leita JONETTA Sable 09/02/2024, 2:39 PM

## 2024-09-02 NOTE — Progress Notes (Signed)
 Orthopedic Tech Progress Note Patient Details:  Samantha Mcbride 08/12/1951 [id number]  Patient ID: Samantha Mcbride, female   DOB: 1951-09-24, 74 y.o.   MRN: [id number] Called into [facility]. Thersia FALCON Deigo Alonso 09/02/2024, 12:26 PM

## 2024-09-02 NOTE — TOC Progression Note (Signed)
 Transition of Care [facility]) - Progression Note    Patient Details  Name: Samantha Mcbride MRN: [id number] Date of Birth: May 10, 1951  [facility] CM/SW Contact  Augusta Mirkin LITTIE Mo[place], [place] Phone Number: 09/02/2024, 12:38 PM  Clinical Narrative:    CSW spoke with pt at bedside and provided SNF bed offers. Pt stated yesterday that [name] choice, CSW expressed to pt that [name] is on the list of accepted facilities. CSW received a call from pt daughter, Veleria, after leaving pt room. Kourtney asked if CSW could update her on disposition as pt does not do a good job of communicating per daughter. Kourtney provided CSW with her email and CSW sent Kourtney a copy of pt bed offers. Veleria stated she would follow up this afternoon on facility choice. CSW will continue to follow.   Expected Discharge Plan: Skilled Nursing Facility Barriers to Discharge: Insurance Authorization, SNF Pending bed offer, Continued Medical Work up               Expected Discharge Plan and Services       Living arrangements for the past 2 months: Single Family Home                                       Social Drivers of Health (SDOH) Interventions SDOH Screenings   Food Insecurity: Food Insecurity Present (08/29/2024)  Housing: Low Risk  (08/29/2024)  Transportation Needs: No Transportation Needs (08/29/2024)  Utilities: Not At Risk (08/29/2024)  Social Connections: Socially Isolated (08/29/2024)  Tobacco Use: Medium Risk (08/30/2024)    Readmission Risk Interventions     No data to display

## 2024-09-02 NOTE — Progress Notes (Signed)
 PROGRESS NOTE    Samantha Mcbride  FMW:[id number] DOB: 1951/08/02 DOA: 08/29/2024 PCP: Seabron Lenis, MD   Brief Narrative:  Samantha Mcbride 73 year old female who presented for worsening back pain and left leg weakness with reports of inability to ambulate.  Found to have concern for grade 1 anterolisthesis at L4-5 with severe spinal stenosis most recent imaging study for which neurosurgery admitted the patient and plan on possible intervention.  Rechecking MRI at this time and it was done without contrast but was reviewed by neurosurgery who felt that she has small abscesses within the muscle at L4-L5 and probable septic arthritis with epidural abscess with severe spinal stenosis.  Neurosurgery recommended L4-L5 hemilaminectomy and she underwent this along with a medial facetectomy foraminotomies with sublaminar decompression for central and right lateral recess decompression and is POD3.  PT/OT recommending SNF and she is stable from a medical standpoint.    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  Patient presents with worsening back pain and left foot drop.  Recent imaging showing grade 1 anterolisthesis at L4-5 with severe spinal stenosis.  Currently given Decadron  10 mg IV.   -MRI lumbar spine without contrast done and showed New 19 x 7 mm fluid collection posterior to the thecal sac at L4-L5, unlikely a synovial cyst since it was not present on the prior recent MRI, it could be a liquified hematoma or abscess but is certainly contributing to the patient's tight stenosis; Notably there was also Severe spinal canal and bilateral lateral recess stenosis at L4-L5 due to diffuse disc bulge, advanced facet arthropathy, and ligamentum flavum thickening and Stable posterior cystic structures at L5-S1, likely dissecting synovial cysts. -Neurosurgery evaluated the  patient for surgical intervention and she is status post left L4-L5 hemilaminectomy, medial facetectomy foraminotomies with sublaminar decompression for central and right lateral recess decompression -She will continue to follow on antibiotic course and will consider consulting ID pending on results. -Follow up on Aerobic and Aerobic/Anaerobic Cx and both showing NGTD so far  -Continued antibiotics with IV Ceftriaxone  2 g every 12 and IV Vancomycin  750 mg every 48 hours but now IV Vancomycin  is discontinued and just remains on CTX Monotherapy -Pain control with Acetaminophen  1000 mg p.o. Q6h scheduled, Hydrocodone -Acetaminophen  7.5-325 mg 1 tablet p.o. every 6 scheduled, and IV morphine  2 mg Q2 as needed for severe pain: Continue with supportive care with ondansetron  4 mg p.o./IV every 6 as needed nausea vomiting and bowel regimen senna 8.6 mg p.o. twice daily -Also continue with Tizanidine  2 mg p.o. every 8 as needed for muscle spasms -Will need PT/OT to Evaluate and Treat and they are recommending SNF; Neurosurgery feels that the foot drop is improving and they recommended continue work with therapy and obtaining TOC consult for rehab placement and she is medically stable for D/C   ? For UTI given Leukocytosis and Low-grade fever, Improved  -Acute.  Patient noted to have a low-grade fever 100.1 F and WBC elevated at 15.3 on admission. This could possibly be secondary to atelectasis she has been in the bed here last couple of days versus possibility of underlying infection from above vs UTI. -Incentive spirometry and flutter valve -WBC went from 15.3 -> 12.9 -> 11.2 -> 8.7 -> 8.3.   -Follow-up ESR was 47 and CRP was 3.5 -Blood Culture x2 showed NGTD @ 4 Days ; Aerobic Cx w/ Gram Stain Negative @  3 Days - Check chest x-ray and U/A showed a cloudy appearance with small hemoglobin, large leukocytes, positive nitrites, rare bacteria, 11-20 RBCs per high-power field and 50 WBCs with urine culture not  being obtained. -Chest x-ray done and showed No acute cardiopulmonary process but did show Stable ossific density along the shoulder. -CTM Cx and Temperature Curve   Hypokalemia: Mild and improved as K+ went from 3.1 -> 4.1 -> 3.9 -> 3.8 x2. CTM and Replete as necessary. Mag Level is now 2.2. CTM and Replete as Necessary. Repeat CMP in the AM  ? Urinary Retention  Chronic Kidney Disease Stage IV / Metabolic Acidosis Patient noted to have some suprapubic tenderness on palpation on physical exam.  Creatinine noted to be 1.98 with BUN 20.  This appears to be around patient's baseline 1.6-2. BUN/Cr Trend fairly stable: Recent Labs  Lab 08/29/24 1141 08/29/24 1219 08/30/24 1416 08/31/24 0256 09/01/24 0543 09/02/24 0518  BUN 20 22 23 23  27* 26*  CREATININE 1.98* 2.10* 1.86* 1.82* 2.11* 2.25*  -Check serial bladder scans for concern for urinary retention -MA is improved and CO2 is now 22, AG is 14, and Chloride Level is 104 -In-N-Out cath as needed/orders placed for Foley catheter persistent urinary retention -U/A as above -Avoid Nephrotoxic Medications, Contrast Dyes, Hypotension and Dehydration to Ensure Adequate Renal Perfusion and will need to Renally Adjust Meds. CTM and Trend Renal Function carefully and repeat CMP in the AM   Essential Hypertension: Blood pressures currently maintained. Continue Carvedilol  12.5 mg po BID. CTM BP per Protocol. Last BP reading was 116/70   COPD: No significant wheezes or rhonchi appreciated on physical exam at this time. -Breathing treatments as needed w/ Albuterol 2.5 mg Neb q4hprn Wheezing and SOB   Coronary Artery Disease Hyperlipidemia Patient with a history of STEMI.  No anginal symptoms reported. -Held Clopidogrel  given need for Surgical Intervention and defer to NSGY when ok to Resume -Continue Beta-blocker w/ Carvedilol  12.5 mg po BID, Ezetimibe  10 mg po Daily, and Rosuvastatin  20 mg po Daily    Rheumatoid Arthritis:  Continue  Hydroxychloroquine  200 mg po daily and Leflunomide  20 mg po daily    History of Breast Cancer: Noted  Normocytic Anemia: Hgb/Hct Trend stable and improved:  Recent Labs  Lab 08/29/24 1141 08/29/24 1219 08/30/24 1416 08/31/24 0256 09/01/24 0543 09/02/24 0518  HGB 11.9* 11.6* 10.0* 9.1* 10.4* 9.7*  HCT 37.5 34.0* 31.9* 28.9* 32.7* 31.4*  MCV 94.2  --  96.4 96.7 95.9 96.3  -Checked Anemia Panel and showed an iron level 64, UIBC 125, TIBC 199, saturation ratios of 34, ferritin level 224, folate of 19.2 and a vitamin B12 of 527. CTM for S/Sx of Bleeding; No overt bleeding noted. Repeat CBC in the AM  Hypoalbuminemia: Patient's Albumin  Lvl went from 3.2 -> 2.7 -> 2.3 -> 2.8 -> 2.5. CTM and Trend and repeat CMP in the AM  Class I Obesity: Complicates overall prognosis and care. Estimated body mass index is 33.07 kg/m as calculated from the following:   Height as of this encounter: 5' 1 (1.549 m).   Weight as of 08/04/24: 79.4 kg. Weight Loss and Dietary Counseling given   DVT prophylaxis: SCD's Start: 08/30/24 1148    Code Status: Full Code Family Communication: No family present @ bedside   Disposition Plan:  Level of care: Med-Surg Status is: Inpatient Remains inpatient appropriate because: Medically stable and awaiting SNF placement  Consultants:  Neurosurgery (primary) [facility]  Procedures:  As delineated as above;  PROCEDURE by Dr. Alm Molt on 08/30/24: Left L4-5 hemilaminectomy, medial facetectomy foraminotomies with sublaminar decompression for central and right lateral recess decompression   Antimicrobials:  Anti-infectives (From admission, onward)    Start     Dose/Rate Route Frequency Ordered Stop   09/01/24 1000  vancomycin (VANCOREADY) IVPB 750 mg/150 mL  Status:  Discontinued        750 mg 150 mL/hr over 60 Minutes Intravenous Every 48 hours 08/30/24 1258 08/31/24 1355   09/01/24 1000  cefTRIAXone  (ROCEPHIN ) 2 g in sodium chloride  0.9 % 100 mL IVPB        2  g 200 mL/hr over 30 Minutes Intravenous Every 24 hours 08/31/24 1324     08/30/24 1345  vancomycin (VANCOREADY) IVPB 750 mg/150 mL        750 mg 150 mL/hr over 60 Minutes Intravenous STAT 08/30/24 1258 08/30/24 1448   08/30/24 1245  cefTRIAXone  (ROCEPHIN ) 2 g in sodium chloride  0.9 % 100 mL IVPB  Status:  Discontinued        2 g 200 mL/hr over 30 Minutes Intravenous Every 12 hours 08/30/24 1147 08/31/24 1324   08/30/24 0915  vancomycin (VANCOCIN) IVPB 1000 mg/200 mL premix        1,000 mg 200 mL/hr over 60 Minutes Intravenous  Once 08/30/24 0909 08/30/24 1120   08/30/24 0915  cefTRIAXone  (ROCEPHIN ) 1 g in sodium chloride  0.9 % 100 mL IVPB        1 g 200 mL/hr over 30 Minutes Intravenous  Once 08/30/24 0909 08/30/24 1844   08/29/24 2015  hydroxychloroquine  (PLAQUENIL ) tablet 200 mg        200 mg Oral Daily 08/29/24 1920         Subjective: Seen and examined at bedside and states that she is doing better.  Thinks her arm is less swollen today.  Still having some pain but is manageable.  No lightheadedness or dizziness.  Thinks she has more motion in her left leg.  No other concerns or complaints at this time.  Objective: Vitals:   09/01/24 2141 09/02/24 0514 09/02/24 0959 09/02/24 1027  BP: (!) 119/53 139/65  116/70  Pulse: 74 79  84  Resp: 17   16  Temp: 97.8 F (36.6 C) 98.1 F (36.7 C)  98 F (36.7 C)  TempSrc: Oral Oral  Oral  SpO2: 93% 94%  93%  Height:   5' 1 (1.549 m)     Intake/Output Summary (Last 24 hours) at 09/02/2024 1411 Last data filed at 09/02/2024 0544 Gross per 24 hour  Intake 947 ml  Output --  Net 947 ml   There were no vitals filed for this visit.  Examination: Physical Exam:  Constitutional: WN/WD obese chronically ill-appearing Caucasian female sitting up in the bed Respiratory: Diminished to auscultation bilaterally, no wheezing, rales, rhonchi or crackles. Normal respiratory effort and patient is not tachypenic. No accessory muscle use.   Unlabored breathing Cardiovascular: RRR, no murmurs / rubs / gallops. S1 and S2 auscultated. No extremity edema.  Abdomen: Soft, non-tender, distended secondary to body habitus. Bowel sounds positive.  GU: Deferred. Musculoskeletal: No clubbing / cyanosis of digits/nails. No joint deformity upper and lower extremities. Skin: No rashes, lesions, ulcers on limited skin evaluation. No induration; Warm and dry.  Neurologic: CN 2-12 grossly intact with no focal deficits. Romberg sign and cerebellar reflexes not assessed.  Psychiatric: Normal judgment and insight. Alert and oriented x 3. Normal mood and appropriate affect.   Data Reviewed: I  have personally reviewed following labs and imaging studies  CBC: Recent Labs  Lab 08/29/24 1141 08/29/24 1219 08/30/24 1416 08/31/24 0256 09/01/24 0543 09/02/24 0518  WBC 15.3*  --  12.9* 11.2* 8.7 8.3  NEUTROABS 13.3*  --  11.8* 9.8* 6.8 6.4  HGB 11.9* 11.6* 10.0* 9.1* 10.4* 9.7*  HCT 37.5 34.0* 31.9* 28.9* 32.7* 31.4*  MCV 94.2  --  96.4 96.7 95.9 96.3  PLT 180  --  180 180 196 172   Basic Metabolic Panel: Recent Labs  Lab 08/29/24 1141 08/29/24 1219 08/30/24 1416 08/31/24 0256 09/01/24 0543 09/02/24 0518  NA 141 138 141 141 140 142  K 3.2* 3.1* 4.1 3.9 3.8 3.8  CL 105 109 109 108 104 108  CO2 23  --  21* 22 22 24   GLUCOSE 106* 103* 154* 185* 88 92  BUN 20 22 23 23  27* 26*  CREATININE 8.01* 2.10* 1.86* 1.82* 2.11* 2.25*  CALCIUM  9.2  --  8.3* 8.1* 8.6* 8.2*  MG  --   --  2.4 2.2 2.4 2.3  PHOS  --   --  2.6 2.9 3.0 3.0   GFR: CrCl cannot be calculated (Unknown ideal weight.). Liver Function Tests: Recent Labs  Lab 08/29/24 1141 08/30/24 1416 08/31/24 0256 09/01/24 0543 09/02/24 0518  AST 15 16 14* 13* 12*  ALT 10 10 9 10 10   ALKPHOS 56 48 45 48 47  BILITOT 0.3 0.5 0.3 0.4 0.5  PROT 6.2* 5.5* 4.9* 5.4* 5.1*  ALBUMIN  3.2* 2.7* 2.3* 2.8* 2.5*   No results for input(s): LIPASE, AMYLASE in the last 168 hours. No results  for input(s): AMMONIA in the last 168 hours. Coagulation Profile: No results for input(s): INR, PROTIME in the last 168 hours. Cardiac Enzymes: No results for input(s): CKTOTAL, CKMB, CKMBINDEX, TROPONINI in the last 168 hours. BNP (last 3 results) No results for input(s): PROBNP in the last 8760 hours. HbA1C: No results for input(s): HGBA1C in the last 72 hours. CBG: No results for input(s): GLUCAP in the last 168 hours. Lipid Profile: No results for input(s): CHOL, HDL, LDLCALC, TRIG, CHOLHDL, LDLDIRECT in the last 72 hours. Thyroid  Function Tests: No results for input(s): TSH, T4TOTAL, FREET4, T3FREE, THYROIDAB in the last 72 hours. Anemia Panel: Recent Labs    09/01/24 0543  VITAMINB12 527  FOLATE 19.2  FERRITIN 224  TIBC 189*  IRON 64  RETICCTPCT 1.7   Sepsis Labs: No results for input(s): PROCALCITON, LATICACIDVEN in the last 168 hours.  Recent Results (from the past 240 hours)  Resp panel by RT-PCR (RSV, Flu A&B, Covid) Anterior Nasal Swab     Status: None   Collection Time: 08/29/24  3:32 PM   Specimen: Anterior Nasal Swab  Result Value Ref Range Status   SARS Coronavirus 2 by RT PCR NEGATIVE NEGATIVE Final   Influenza A by PCR NEGATIVE NEGATIVE Final   Influenza B by PCR NEGATIVE NEGATIVE Final    Comment: (NOTE) The Xpert Xpress SARS-CoV-2/FLU/RSV plus assay is intended as an aid in the diagnosis of influenza from Nasopharyngeal swab specimens and should not be used as a sole basis for treatment. Nasal washings and aspirates are unacceptable for Xpert Xpress SARS-CoV-2/FLU/RSV testing.  Fact Sheet for Patients: bloggercourse.com  Fact Sheet for Healthcare Providers: seriousbroker.it  This test is not yet approved or cleared by the [place]  FDA and has been authorized for detection and/or diagnosis of SARS-CoV-2 by FDA under an Emergency Use Authorization  (EUA). This EUA will remain  in effect (meaning this test can be used) for the duration of the COVID-19 declaration under Section 564(b)(1) of the Act, 21 U.S.C. section 360bbb-3(b)(1), unless the authorization is terminated or revoked.     Resp Syncytial Virus by PCR NEGATIVE NEGATIVE Final    Comment: (NOTE) Fact Sheet for Patients: bloggercourse.com  Fact Sheet for Healthcare Providers: seriousbroker.it  This test is not yet approved or cleared by the [place]  FDA and has been authorized for detection and/or diagnosis of SARS-CoV-2 by FDA under an Emergency Use Authorization (EUA). This EUA will remain in effect (meaning this test can be used) for the duration of the COVID-19 declaration under Section 564(b)(1) of the Act, 21 U.S.C. section 360bbb-3(b)(1), unless the authorization is terminated or revoked.  Performed at [facility], [address], [place], [place] [address]   Culture, blood (Routine X 2) w Reflex to ID Panel     Status: None (Preliminary result)   Collection Time: 08/29/24  6:48 PM   Specimen: BLOOD LEFT HAND  Result Value Ref Range Status   Specimen Description BLOOD LEFT HAND  Final   Special Requests   Final    BOTTLES DRAWN AEROBIC AND ANAEROBIC Blood Culture results may not be optimal due to an inadequate volume of blood received in culture bottles   Culture   Final    NO GROWTH 4 DAYS Performed at [facility], [address], [place], [place] [address]    Report Status PENDING  Incomplete  Culture, blood (Routine X 2) w Reflex to ID Panel     Status: None (Preliminary result)   Collection Time: 08/29/24  6:48 PM   Specimen: BLOOD  Result Value Ref Range Status   Specimen Description BLOOD  Final   Special Requests   Final    AEROBIC BOTTLE ONLY Blood Culture results may not be optimal due to an inadequate volume of blood received in culture bottles   Culture   Final    NO GROWTH  4 DAYS Performed at [facility], [address] [place], [place] [address]    Report Status PENDING  Incomplete  Surgical PCR screen     Status: None   Collection Time: 08/29/24 10:55 PM   Specimen: Nasal Mucosa; Nasal Swab  Result Value Ref Range Status   MRSA, PCR NEGATIVE NEGATIVE Final   Staphylococcus aureus NEGATIVE NEGATIVE Final    Comment: (NOTE) The Xpert SA Assay (FDA approved for NASAL specimens in patients 28 years of age and older), is one component of a comprehensive surveillance program. It is not intended to diagnose infection nor to guide or monitor treatment. Performed at [facility], [address] [place], [place] [address]   Aerobic/Anaerobic Culture w Gram Stain (surgical/deep wound)     Status: None (Preliminary result)   Collection Time: 08/30/24 10:01 AM   Specimen: Soft Tissue, Other  Result Value Ref Range Status   Specimen Description TISSUE  Final   Special Requests LUMBAR EPIDURAL TISSUE  Final   Gram Stain NO WBC SEEN NO ORGANISMS SEEN   Final   Culture   Final    NO GROWTH 3 DAYS NO ANAEROBES ISOLATED; CULTURE IN PROGRESS FOR 5 DAYS Performed at [facility], [address] [place], [place] [address]    Report Status PENDING  Incomplete  Aerobic Culture w Gram Stain (superficial specimen)     Status: None   Collection Time: 08/30/24 10:05 AM  Specimen: Wound  Result Value Ref Range Status   Specimen Description WOUND  Final   Special Requests LUMBAR EPIDURAL SPACE  Final   Gram Stain NO WBC SEEN NO ORGANISMS SEEN   Final   Culture   Final    NO GROWTH 2 DAYS Performed at [facility], [address] [place], [place] [address]    Report Status 09/01/2024 FINAL  Final    Radiology Studies: No results found.  Scheduled Meds:  carvedilol   12.5 mg Oral BID WC   ezetimibe   10 mg Oral Daily   gabapentin   300 mg Oral QPM   HYDROcodone -acetaminophen   1 tablet Oral Q6H   hydroxychloroquine   200 mg Oral Daily    leflunomide   20 mg Oral Daily   melatonin  5 mg Oral QPM   multivitamin with minerals  1 tablet Oral Daily   rosuvastatin   20 mg Oral Daily   senna  1 tablet Oral BID   sodium chloride  flush  3 mL Intravenous Q12H   traZODone   100 mg Oral QHS   Continuous Infusions:  0.9 % NaCl with KCl 20 mEq / L 75 mL/hr at 09/02/24 1141   cefTRIAXone  (ROCEPHIN )  IV 2 g (09/02/24 0901)    LOS: 4 days   Alejandro Marker, DO [facility] Available via Epic secure chat 7am-7pm After these hours, please refer to coverage provider listed on [email or web address] 09/02/2024, 2:11 PM

## 2024-09-02 NOTE — Plan of Care (Signed)
  Problem: Education: Goal: Knowledge of General Education information will improve Description: Including pain rating scale, medication(s)/side effects and non-pharmacologic comfort measures 09/02/2024 0210 by Peri Magdalena LABOR, RN Outcome: Progressing 09/02/2024 0210 by Peri Magdalena LABOR, RN Outcome: Progressing   Problem: Health Behavior/Discharge Planning: Goal: Ability to manage health-related needs will improve 09/02/2024 0210 by Peri Magdalena LABOR, RN Outcome: Progressing 09/02/2024 0210 by Peri Magdalena LABOR, RN Outcome: Progressing   Problem: Activity: Goal: Risk for activity intolerance will decrease 09/02/2024 0210 by Peri Magdalena LABOR, RN Outcome: Progressing 09/02/2024 0210 by Peri Magdalena LABOR, RN Outcome: Progressing   Problem: Coping: Goal: Level of anxiety will decrease 09/02/2024 0210 by Peri Magdalena LABOR, RN Outcome: Progressing 09/02/2024 0210 by Peri Magdalena LABOR, RN Outcome: Progressing   Problem: Elimination: Goal: Will not experience complications related to bowel motility 09/02/2024 0210 by Peri Magdalena LABOR, RN Outcome: Progressing 09/02/2024 0210 by Peri Magdalena LABOR, RN Outcome: Progressing Goal: Will not experience complications related to urinary retention 09/02/2024 0210 by Peri Magdalena LABOR, RN Outcome: Progressing 09/02/2024 0210 by Peri Magdalena LABOR, RN Outcome: Progressing   Problem: Pain Managment: Goal: General experience of comfort will improve and/or be controlled 09/02/2024 0210 by Peri Magdalena LABOR, RN Outcome: Progressing 09/02/2024 0210 by Peri Magdalena LABOR, RN Outcome: Progressing   Problem: Safety: Goal: Ability to remain free from injury will improve 09/02/2024 0210 by Peri Magdalena LABOR, RN Outcome: Progressing 09/02/2024 0210 by Peri Magdalena LABOR, RN Outcome: Progressing

## 2024-09-02 NOTE — Plan of Care (Signed)

## 2024-09-02 NOTE — Progress Notes (Signed)

## 2024-09-03 DIAGNOSIS — M4316 Spondylolisthesis, lumbar region: Secondary | ICD-10-CM | POA: Diagnosis not present

## 2024-09-03 DIAGNOSIS — R509 Fever, unspecified: Secondary | ICD-10-CM | POA: Diagnosis not present

## 2024-09-03 DIAGNOSIS — D72829 Elevated white blood cell count, unspecified: Secondary | ICD-10-CM | POA: Diagnosis not present

## 2024-09-03 DIAGNOSIS — E876 Hypokalemia: Secondary | ICD-10-CM | POA: Diagnosis not present

## 2024-09-03 LAB — COMPREHENSIVE METABOLIC PANEL WITH GFR
ALT: 11 U/L (ref 0–44)
AST: 14 U/L — ABNORMAL LOW (ref 15–41)
Albumin: 2.1 g/dL — ABNORMAL LOW (ref 3.5–5.0)
Alkaline Phosphatase: 42 U/L (ref 38–126)
Anion gap: 13 (ref 5–15)
BUN: 25 mg/dL — ABNORMAL HIGH (ref 8–23)
CO2: 21 mmol/L — ABNORMAL LOW (ref 22–32)
Calcium: 8.1 mg/dL — ABNORMAL LOW (ref 8.9–10.3)
Chloride: 108 mmol/L (ref 98–111)
Creatinine, Ser: 2.24 mg/dL — ABNORMAL HIGH (ref 0.44–1.00)
GFR, Estimated: 23 mL/min — ABNORMAL LOW (ref >60)
Glucose, Bld: 95 mg/dL (ref 70–99)
Potassium: 4 mmol/L (ref 3.5–5.1)
Sodium: 142 mmol/L (ref 135–145)
Total Bilirubin: 0.4 mg/dL (ref 0.0–1.2)
Total Protein: 4.5 g/dL — ABNORMAL LOW (ref 6.5–8.1)

## 2024-09-03 LAB — CBC WITH DIFFERENTIAL/PLATELET
Abs Immature Granulocytes: 0.08 K/uL — ABNORMAL HIGH (ref 0.00–0.07)
Basophils Absolute: 0 K/uL (ref 0.0–0.1)
Basophils Relative: 0 %
Eosinophils Absolute: 0.1 K/uL (ref 0.0–0.5)
Eosinophils Relative: 2 %
HCT: 27.7 % — ABNORMAL LOW (ref 36.0–46.0)
Hemoglobin: 8.5 g/dL — ABNORMAL LOW (ref 12.0–15.0)
Immature Granulocytes: 1 %
Lymphocytes Relative: 18 %
Lymphs Abs: 1.2 K/uL (ref 0.7–4.0)
MCH: 29.8 pg (ref 26.0–34.0)
MCHC: 30.7 g/dL (ref 30.0–36.0)
MCV: 97.2 fL (ref 80.0–100.0)
Monocytes Absolute: 0.5 K/uL (ref 0.1–1.0)
Monocytes Relative: 7 %
Neutro Abs: 4.9 K/uL (ref 1.7–7.7)
Neutrophils Relative %: 72 %
Platelets: 154 K/uL (ref 150–400)
RBC: 2.85 MIL/uL — ABNORMAL LOW (ref 3.87–5.11)
RDW: 13.3 % (ref 11.5–15.5)
WBC: 6.8 K/uL (ref 4.0–10.5)
nRBC: 0.3 % — ABNORMAL HIGH (ref 0.0–0.2)

## 2024-09-03 LAB — CULTURE, BLOOD (ROUTINE X 2)
Culture: NO GROWTH
Culture: NO GROWTH

## 2024-09-03 LAB — PHOSPHORUS: Phosphorus: 3.2 mg/dL (ref 2.5–4.6)

## 2024-09-03 LAB — MAGNESIUM: Magnesium: 2.3 mg/dL (ref 1.7–2.4)

## 2024-09-03 NOTE — Progress Notes (Signed)
 Physical Therapy Treatment  Patient Details Name: Samantha Mcbride MRN: [id number] DOB: Apr 18, 1951 Today's Date: 09/03/2024   History of Present Illness Pt is a 73 y/o female who presents s/p L L4-5 hemilaminectomy, medial facetectomy foraminotomies with sublaminar decompression for central and right lateral recess decompression on 08/30/2024. PMH significant for CA, CKD IV, HTN, RA, DOE, IM nail L 2024.    PT Comments  Pt progressing with post-op mobility. She was able to demonstrate transfers and ambulation with gross CGA and RW for support. Continues to require chair follow and frequent seated rest breaks. Reinforced education on precautions, appropriate activity progression, and positioning recommendations. Will continue to follow.      If plan is discharge home, recommend the following: A little help with walking and/or transfers;A little help with bathing/dressing/bathroom;Assistance with cooking/housework;Assist for transportation;Help with stairs or ramp for entrance   Can travel by private vehicle     Yes  Equipment Recommendations  Rolling walker (2 wheels)    Recommendations for Other Services       Precautions / Restrictions Precautions Precautions: Back;Fall Precaution Booklet Issued: Yes (comment) Recall of Precautions/Restrictions: Intact Precaution/Restrictions Comments: Reviewed handout and pt was cued for precautions during functional mobility. Restrictions Weight Bearing Restrictions Per Provider Order: No     Mobility  Bed Mobility               General bed mobility comments: Pt was received sitting up in the recliner.    Transfers Overall transfer level: Needs assistance Equipment used: Rolling walker (2 wheels) Transfers: Sit to/from Stand Sit to Stand: Contact guard assist           General transfer comment: VC's for hand placement on seated surface for safety. Hands on guarding for safety.    Ambulation/Gait Ambulation/Gait assistance:  Contact guard assist Gait Distance (Feet): 50 Feet (50'+50'+20') Assistive device: Rolling walker (2 wheels) Gait Pattern/deviations: Step-through pattern, Decreased stride length, Trunk flexed, Shuffle Gait velocity: Decreased Gait velocity interpretation: <1.8 ft/sec, indicate of risk for recurrent falls   General Gait Details: Heel strike deficits L>R. Very slow pace however overall improved from yesterday. Continues to require frequent seated rest breaks and chair follow.   Stairs             Wheelchair Mobility     Tilt Bed    Modified Rankin (Stroke Patients Only)       Balance Overall balance assessment: Needs assistance Sitting-balance support: Single extremity supported, Feet supported Sitting balance-Leahy Scale: Fair Sitting balance - Comments: Pt able to sit EOB with close supervision Postural control: Posterior lean Standing balance support: Bilateral upper extremity supported, During functional activity, Reliant on assistive device for balance Standing balance-Leahy Scale: Poor Standing balance comment: Dependent on RW and external support                            Communication Communication Communication: No apparent difficulties  Cognition Arousal: Alert Behavior During Therapy: WFL for tasks assessed/performed   PT - Cognitive impairments: No apparent impairments                         Following commands: Intact      Cueing Cueing Techniques: Verbal cues, Gestural cues  Exercises      General Comments        Pertinent Vitals/Pain Pain Assessment Pain Assessment: Faces Faces Pain Scale: Hurts little more Pain Location: back and L  upper arm Pain Descriptors / Indicators: Grimacing, Operative site guarding, Sore, Burning Pain Intervention(s): Limited activity within patient's tolerance, Monitored during session, Repositioned, Ice applied    Home Living                          Prior Function             PT Goals (current goals can now be found in the care plan section) Acute Rehab PT Goals Patient Stated Goal: Return home after SNF stay PT Goal Formulation: With patient Time For Goal Achievement: 09/14/24 Potential to Achieve Goals: Good Progress towards PT goals: Progressing toward goals    Frequency    Min 5X/week      PT Plan      Co-evaluation              AM-PAC PT 6 Clicks Mobility   Outcome Measure  Help needed turning from your back to your side while in a flat bed without using bedrails?: A Little Help needed moving from lying on your back to sitting on the side of a flat bed without using bedrails?: A Little Help needed moving to and from a bed to a chair (including a wheelchair)?: A Little Help needed standing up from a chair using your arms (e.g., wheelchair or bedside chair)?: A Little Help needed to walk in hospital room?: A Little Help needed climbing 3-5 steps with a railing? : A Lot 6 Click Score: 17    End of Session Equipment Utilized During Treatment: Gait belt Activity Tolerance: Patient limited by fatigue Patient left: in bed;with call bell/phone within reach;with family/visitor present Nurse Communication: Mobility status PT Visit Diagnosis: Unsteadiness on feet (R26.81);Pain Pain - part of body:  (back, upper arm)     Time: 8578-8555 PT Time Calculation (min) (ACUTE ONLY): 23 min  Charges:    $Gait Training: 23-37 mins PT General Charges $$ ACUTE PT VISIT: 1 Visit                     Leita Sable, PT, DPT Acute Rehabilitation Services Secure Chat Preferred Office: [phone]    Leita JONETTA Sable 09/03/2024, 3:36 PM

## 2024-09-03 NOTE — Plan of Care (Signed)

## 2024-09-03 NOTE — Progress Notes (Signed)
 NEUROSURGERY PROGRESS NOTE  Doing well. Complains of appropriate back soreness. No leg pain No numbness, tingling or weakness Ambulating and voiding well Left foot drop improving 3/5  Temp:  [98 F (36.7 C)-98.4 F (36.9 C)] 98.2 F (36.8 C) (11/20 0519) Pulse Rate:  [80-86] 80 (11/20 0519) Resp:  [16-18] 18 (11/20 0519) BP: (113-143)/(51-70) 143/65 (11/20 0519) SpO2:  [93 %-96 %] 94 % (11/20 0519) Weight:  [87.9 kg] 87.9 kg (11/19 1800)  Plan: Continue therapies. Awaiting SNF placement  Suzen Chiquita Pean, NP 09/03/2024 8:01 AM

## 2024-09-03 NOTE — TOC Progression Note (Signed)
 Transition of Care [facility]) - Progression Note    Patient Details  Name: Samantha Mcbride MRN: [id number] Date of Birth: Nov 09, 1950  [facility] CM/SW Contact  Nahima Ales LITTIE [place], [place] Phone Number: 09/03/2024, 4:27 PM  Clinical Narrative:    CSW initiated insurance auth process for [employer]. Ref #[id number]. CSW will continue to follow.   Expected Discharge Plan: Skilled Nursing Facility Barriers to Discharge: Insurance Authorization, SNF Pending bed offer, Continued Medical Work up               Expected Discharge Plan and Services       Living arrangements for the past 2 months: Single Family Home                                       Social Drivers of Health (SDOH) Interventions SDOH Screenings   Food Insecurity: Food Insecurity Present (08/29/2024)  Housing: Low Risk  (08/29/2024)  Transportation Needs: No Transportation Needs (08/29/2024)  Utilities: Not At Risk (08/29/2024)  Social Connections: Socially Isolated (08/29/2024)  Tobacco Use: Medium Risk (08/30/2024)    Readmission Risk Interventions     No data to display

## 2024-09-03 NOTE — Plan of Care (Signed)

## 2024-09-03 NOTE — Progress Notes (Signed)
 PROGRESS NOTE    Kristie Bracewell  FMW:[id number] DOB: [birth year] DOA: 08/29/2024 PCP: Seabron Lenis, MD   Brief Narrative:  Ms. Traub 73 year old female who presented for worsening back pain and left leg weakness with reports of inability to ambulate.  Found to have concern for grade 1 anterolisthesis at L4-5 with severe spinal stenosis most recent imaging study for which neurosurgery admitted the patient and plan on possible intervention.  Rechecking MRI at this time and it was done without contrast but was reviewed by neurosurgery who felt that she has small abscesses within the muscle at L4-L5 and probable septic arthritis with epidural abscess with severe spinal stenosis.  Neurosurgery recommended L4-L5 hemilaminectomy and she underwent this along with a medial facetectomy foraminotomies with sublaminar decompression for central and right lateral recess decompression and is POD4.  PT/OT recommending SNF and she is stable from a medical standpoint.    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  Patient presents with worsening back pain and left foot drop.  Recent imaging showing grade 1 anterolisthesis at L4-5 with severe spinal stenosis.  Currently given Decadron  10 mg IV.   -MRI lumbar spine without contrast done and showed New 19 x 7 mm fluid collection posterior to the thecal sac at L4-L5, unlikely a synovial cyst since it was not present on the prior recent MRI, it could be a liquified hematoma or abscess but is certainly contributing to the patient's tight stenosis; Notably there was also Severe spinal canal and bilateral lateral recess stenosis at L4-L5 due to diffuse disc bulge, advanced facet arthropathy, and ligamentum flavum thickening and Stable posterior cystic structures at L5-S1, likely dissecting synovial cysts. -Neurosurgery evaluated the  patient for surgical intervention and she is status post left L4-L5 hemilaminectomy, medial facetectomy foraminotomies with sublaminar decompression for central and right lateral recess decompression -She will continue to follow on antibiotic course and will consider consulting ID pending on results. -Follow up on Aerobic and Aerobic/Anaerobic Cx and both showing NGTD so far  -Continued antibiotics with IV Ceftriaxone  2 g every 12 and IV Vancomycin 750 mg every 48 hours but now IV Vancomycin is discontinued and just remains on CTX Monotherapy -Pain control with Acetaminophen  1000 mg p.o. Q6h scheduled, Hydrocodone -Acetaminophen  7.5-325 mg 1 tablet p.o. every 6 scheduled, and IV morphine  2 mg Q2 as needed for severe pain: Continue with supportive care with ondansetron  4 mg p.o./IV every 6 as needed nausea vomiting and bowel regimen senna 8.6 mg p.o. twice daily -Also continue with Tizanidine 2 mg p.o. every 8 as needed for muscle spasms -Will need PT/OT to Evaluate and Treat and they are recommending SNF; Neurosurgery feels that the foot drop is improving and they recommended continue work with therapy and obtaining TOC consult for rehab placement and she is medically stable for D/C and awaiting SNF placement and insurance auth submitted for [name] in [place]   ? For UTI given Leukocytosis and Low-grade fever, Improved  -Acute.  Patient noted to have a low-grade fever 100.1 F and WBC elevated at 15.3 on admission. This could possibly be secondary to atelectasis she has been in the bed here last couple of days versus possibility of underlying infection from above vs UTI. -Incentive spirometry and flutter valve -WBC went from 15.3 -> 12.9 -> 11.2 -> 8.7 -> 8.3 -> 6.8.   -Follow-up ESR was 47 and CRP was 3.5 -  Blood Culture x2 showed NGTD @ 4 Days ; Aerobic Cx w/ Gram Stain Negative @ 2 Days - Check chest x-ray and U/A showed a cloudy appearance with small hemoglobin, large leukocytes, positive  nitrites, rare bacteria, 11-20 RBCs per high-power field and 50 WBCs with urine culture not being obtained. -Chest x-ray done and showed No acute cardiopulmonary process but did show Stable ossific density along the shoulder. -CTM Cx and Temperature Curve   Hypokalemia: Mild and improved as K+ went from 3.1 -> 4.1 -> 3.9 -> 3.8 x2 -> 4.0. CTM and Replete as necessary. Mag Level is now 2.2. CTM and Replete as Necessary. Repeat CMP in the AM  ? Urinary Retention  Chronic Kidney Disease Stage IV / Metabolic Acidosis Patient noted to have some suprapubic tenderness on palpation on physical exam.  Creatinine noted to be 1.98 with BUN 20.  This appears to be around patient's baseline 1.6-2. BUN/Cr Trend fairly stable: Recent Labs  Lab 08/29/24 1141 08/29/24 1219 08/30/24 1416 08/31/24 0256 09/01/24 0543 09/02/24 0518 09/03/24 0452  BUN 20 22 23 23  27* 26* 25*  CREATININE 1.98* 2.10* 1.86* 1.82* 2.11* 2.25* 2.24*  -Check serial bladder scans for concern for urinary retention -Has a slight MA w/ a CO2 21, AG of 13, Chloride Level of 108 -In-N-Out cath as needed/orders placed for Foley catheter persistent urinary retention -U/A as above -Avoid Nephrotoxic Medications, Contrast Dyes, Hypotension and Dehydration to Ensure Adequate Renal Perfusion and will need to Renally Adjust Meds. CTM and Trend Renal Function carefully and repeat CMP in the AM   Essential Hypertension: Blood pressures currently maintained. Continue Carvedilol  12.5 mg po BID. CTM BP per Protocol. Last BP reading was 138/70   COPD: No significant wheezes or rhonchi appreciated on physical exam at this time. -Breathing treatments as needed w/ Albuterol 2.5 mg Neb q4hprn Wheezing and SOB   Coronary Artery Disease Hyperlipidemia Patient with a history of STEMI.  No anginal symptoms reported. -Held Clopidogrel  given need for Surgical Intervention and defer to NSGY when ok to Resume -Continue Beta-blocker w/ Carvedilol  12.5 mg po  BID, Ezetimibe  10 mg po Daily, and Rosuvastatin  20 mg po Daily    Rheumatoid Arthritis:  Continue Hydroxychloroquine  200 mg po daily and Leflunomide  20 mg po daily    History of Breast Cancer: Noted  Normocytic Anemia: Hgb/Hct Trend stable and improved:  Recent Labs  Lab 08/29/24 1141 08/29/24 1219 08/30/24 1416 08/31/24 0256 09/01/24 0543 09/02/24 0518 09/03/24 0452  HGB 11.9* 11.6* 10.0* 9.1* 10.4* 9.7* 8.5*  HCT 37.5 34.0* 31.9* 28.9* 32.7* 31.4* 27.7*  MCV 94.2  --  96.4 96.7 95.9 96.3 97.2  -Checked Anemia Panel and showed an iron level 64, UIBC 125, TIBC 199, saturation ratios of 34, ferritin level 224, folate of 19.2 and a vitamin B12 of 527. CTM for S/Sx of Bleeding; No overt bleeding noted. Repeat CBC in the AM  Hypoalbuminemia: Patient's Albumin  Lvl went from 3.2 -> 2.7 -> 2.3 -> 2.8 -> 2.5 -> 2.1. CTM and Trend and repeat CMP in the AM  Class I Obesity: Complicates overall prognosis and care. Estimated body mass index is 36.62 kg/m as calculated from the following:   Height as of this encounter: 5' 1 (1.549 m).   Weight as of this encounter: 87.9 kg. Weight Loss and Dietary Counseling given   DVT prophylaxis: SCD's Start: 08/30/24 1148    Code Status: Full Code Family Communication: No family present @ bedside   Disposition Plan:  Level  of care: Med-Surg Status is: Inpatient Remains inpatient appropriate because: Medically stable for D/C and awaiting SNF placement.   Consultants:  Neurosurgery (primary) [facility]  Procedures:  As delineated as above;    PROCEDURE by Dr. Alm Molt on 08/30/24: Left L4-5 hemilaminectomy, medial facetectomy foraminotomies with sublaminar decompression for central and right lateral recess decompression   Antimicrobials:  Anti-infectives (From admission, onward)    Start     Dose/Rate Route Frequency Ordered Stop   09/01/24 1000  vancomycin (VANCOREADY) IVPB 750 mg/150 mL  Status:  Discontinued        750 mg 150 mL/hr over 60  Minutes Intravenous Every 48 hours 08/30/24 1258 08/31/24 1355   09/01/24 1000  cefTRIAXone  (ROCEPHIN ) 2 g in sodium chloride  0.9 % 100 mL IVPB  Status:  Discontinued        2 g 200 mL/hr over 30 Minutes Intravenous Every 24 hours 08/31/24 1324 09/03/24 0804   08/30/24 1345  vancomycin (VANCOREADY) IVPB 750 mg/150 mL        750 mg 150 mL/hr over 60 Minutes Intravenous STAT 08/30/24 1258 08/30/24 1448   08/30/24 1245  cefTRIAXone  (ROCEPHIN ) 2 g in sodium chloride  0.9 % 100 mL IVPB  Status:  Discontinued        2 g 200 mL/hr over 30 Minutes Intravenous Every 12 hours 08/30/24 1147 08/31/24 1324   08/30/24 0915  vancomycin (VANCOCIN) IVPB 1000 mg/200 mL premix        1,000 mg 200 mL/hr over 60 Minutes Intravenous  Once 08/30/24 0909 08/30/24 1120   08/30/24 0915  cefTRIAXone  (ROCEPHIN ) 1 g in sodium chloride  0.9 % 100 mL IVPB        1 g 200 mL/hr over 30 Minutes Intravenous  Once 08/30/24 0909 08/30/24 1844   08/29/24 2015  hydroxychloroquine  (PLAQUENIL ) tablet 200 mg        200 mg Oral Daily 08/29/24 1920         Subjective: Seen and examined at bedside and feels okay and has selective standing great SNF.  No nausea or vomiting.  Pain is fairly well-controlled.  Able to move her legs and IV has been removed from her foot.  No chest pain or shortness of breath.  No other concerns or complaints at this time.  Objective: Vitals:   09/02/24 1800 09/02/24 2032 09/03/24 0519 09/03/24 0816  BP:  116/68 (!) 143/65 138/70  Pulse:  82 80 86  Resp:   18   Temp:  98.4 F (36.9 C) 98.2 F (36.8 C)   TempSrc:  Oral    SpO2:  94% 94%   Weight: 87.9 kg     Height:        Intake/Output Summary (Last 24 hours) at 09/03/2024 1657 Last data filed at 09/03/2024 9666 Gross per 24 hour  Intake 706.07 ml  Output --  Net 706.07 ml   Filed Weights   09/02/24 1800  Weight: 87.9 kg   Examination: Physical Exam:  Constitutional: WN/WD obese chronically ill-appearing Caucasian female in the bed  without issues Respiratory: Diminished to auscultation bilaterally, no wheezing, rales, rhonchi or crackles. Normal respiratory effort and patient is not tachypenic. No accessory muscle use.  Unlabored breathing Cardiovascular: RRR, no murmurs / rubs / gallops. S1 and S2 auscultated. No extremity edema. Abdomen: Soft, non-tender, distended secondary to body habitus.  Bowel sounds positive.  GU: Deferred. Musculoskeletal: No clubbing / cyanosis of digits/nails. No joint deformity upper and lower extremities.  Skin: No rashes, lesions, ulcers  on limited skin evaluation. No induration; Warm and dry.  Neurologic: CN 2-12 grossly intact with no focal deficits. Romberg sign and cerebellar reflexes not assessed.  Psychiatric: Normal judgment and insight. Alert and oriented x 3. Normal mood and appropriate affect.   Data Reviewed: I have personally reviewed following labs and imaging studies  CBC: Recent Labs  Lab 08/30/24 1416 08/31/24 0256 09/01/24 0543 09/02/24 0518 09/03/24 0452  WBC 12.9* 11.2* 8.7 8.3 6.8  NEUTROABS 11.8* 9.8* 6.8 6.4 4.9  HGB 10.0* 9.1* 10.4* 9.7* 8.5*  HCT 31.9* 28.9* 32.7* 31.4* 27.7*  MCV 96.4 96.7 95.9 96.3 97.2  PLT 180 180 196 172 154   Basic Metabolic Panel: Recent Labs  Lab 08/30/24 1416 08/31/24 0256 09/01/24 0543 09/02/24 0518 09/03/24 0452  NA 141 141 140 142 142  K 4.1 3.9 3.8 3.8 4.0  CL 109 108 104 108 108  CO2 21* 22 22 24  21*  GLUCOSE 154* 185* 88 92 95  BUN 23 23 27* 26* 25*  CREATININE 1.86* 1.82* 2.11* 2.25* 2.24*  CALCIUM  8.3* 8.1* 8.6* 8.2* 8.1*  MG 2.4 2.2 2.4 2.3 2.3  PHOS 2.6 2.9 3.0 3.0 3.2   GFR: Estimated Creatinine Clearance: 22.9 mL/min (A) (by C-G formula based on SCr of 2.24 mg/dL (H)). Liver Function Tests: Recent Labs  Lab 08/30/24 1416 08/31/24 0256 09/01/24 0543 09/02/24 0518 09/03/24 0452  AST 16 14* 13* 12* 14*  ALT 10 9 10 10 11   ALKPHOS 48 45 48 47 42  BILITOT 0.5 0.3 0.4 0.5 0.4  PROT 5.5* 4.9* 5.4*  5.1* 4.5*  ALBUMIN  2.7* 2.3* 2.8* 2.5* 2.1*   No results for input(s): LIPASE, AMYLASE in the last 168 hours. No results for input(s): AMMONIA in the last 168 hours. Coagulation Profile: No results for input(s): INR, PROTIME in the last 168 hours. Cardiac Enzymes: No results for input(s): CKTOTAL, CKMB, CKMBINDEX, TROPONINI in the last 168 hours. BNP (last 3 results) No results for input(s): PROBNP in the last 8760 hours. HbA1C: No results for input(s): HGBA1C in the last 72 hours. CBG: No results for input(s): GLUCAP in the last 168 hours. Lipid Profile: No results for input(s): CHOL, HDL, LDLCALC, TRIG, CHOLHDL, LDLDIRECT in the last 72 hours. Thyroid  Function Tests: No results for input(s): TSH, T4TOTAL, FREET4, T3FREE, THYROIDAB in the last 72 hours. Anemia Panel: Recent Labs    09/01/24 0543  VITAMINB12 527  FOLATE 19.2  FERRITIN 224  TIBC 189*  IRON 64  RETICCTPCT 1.7   Sepsis Labs: No results for input(s): PROCALCITON, LATICACIDVEN in the last 168 hours.  Recent Results (from the past 240 hours)  Resp panel by RT-PCR (RSV, Flu A&B, Covid) Anterior Nasal Swab     Status: None   Collection Time: 08/29/24  3:32 PM   Specimen: Anterior Nasal Swab  Result Value Ref Range Status   SARS Coronavirus 2 by RT PCR NEGATIVE NEGATIVE Final   Influenza A by PCR NEGATIVE NEGATIVE Final   Influenza B by PCR NEGATIVE NEGATIVE Final    Comment: (NOTE) The Xpert Xpress SARS-CoV-2/FLU/RSV plus assay is intended as an aid in the diagnosis of influenza from Nasopharyngeal swab specimens and should not be used as a sole basis for treatment. Nasal washings and aspirates are unacceptable for Xpert Xpress SARS-CoV-2/FLU/RSV testing.  Fact Sheet for Patients: bloggercourse.com  Fact Sheet for Healthcare Providers: seriousbroker.it  This test is not yet approved or cleared by the [place]  [place] FDA and has been authorized for detection  and/or diagnosis of SARS-CoV-2 by FDA under an Emergency Use Authorization (EUA). This EUA will remain in effect (meaning this test can be used) for the duration of the COVID-19 declaration under Section 564(b)(1) of the Act, 21 U.S.C. section 360bbb-3(b)(1), unless the authorization is terminated or revoked.     Resp Syncytial Virus by PCR NEGATIVE NEGATIVE Final    Comment: (NOTE) Fact Sheet for Patients: bloggercourse.com  Fact Sheet for Healthcare Providers: seriousbroker.it  This test is not yet approved or cleared by the [place]  FDA and has been authorized for detection and/or diagnosis of SARS-CoV-2 by FDA under an Emergency Use Authorization (EUA). This EUA will remain in effect (meaning this test can be used) for the duration of the COVID-19 declaration under Section 564(b)(1) of the Act, 21 U.S.C. section 360bbb-3(b)(1), unless the authorization is terminated or revoked.  Performed at [facility], [address] [place], [place] [address]   Culture, blood (Routine X 2) w Reflex to ID Panel     Status: None   Collection Time: 08/29/24  6:48 PM   Specimen: BLOOD LEFT HAND  Result Value Ref Range Status   Specimen Description BLOOD LEFT HAND  Final   Special Requests   Final    BOTTLES DRAWN AEROBIC AND ANAEROBIC Blood Culture results may not be optimal due to an inadequate volume of blood received in culture bottles   Culture   Final    NO GROWTH 5 DAYS Performed at [facility], [address] [place], [place] [address]    Report Status 09/03/2024 FINAL  Final  Culture, blood (Routine X 2) w Reflex to ID Panel     Status: None   Collection Time: 08/29/24  6:48 PM   Specimen: BLOOD  Result Value Ref Range Status   Specimen Description BLOOD  Final   Special Requests   Final    AEROBIC BOTTLE ONLY Blood Culture results may not be optimal due to an  inadequate volume of blood received in culture bottles   Culture   Final    NO GROWTH 5 DAYS Performed at [facility], [address], [place], [place] [address]    Report Status 09/03/2024 FINAL  Final  Surgical PCR screen     Status: None   Collection Time: 08/29/24 10:55 PM   Specimen: Nasal Mucosa; Nasal Swab  Result Value Ref Range Status   MRSA, PCR NEGATIVE NEGATIVE Final   Staphylococcus aureus NEGATIVE NEGATIVE Final    Comment: (NOTE) The Xpert SA Assay (FDA approved for NASAL specimens in patients 38 years of age and older), is one component of a comprehensive surveillance program. It is not intended to diagnose infection nor to guide or monitor treatment. Performed at [facility], [address], [place], [place] [address]   Aerobic/Anaerobic Culture w Gram Stain (surgical/deep wound)     Status: None (Preliminary result)   Collection Time: 08/30/24 10:01 AM   Specimen: Soft Tissue, Other  Result Value Ref Range Status   Specimen Description TISSUE  Final   Special Requests LUMBAR EPIDURAL TISSUE  Final   Gram Stain NO WBC SEEN NO ORGANISMS SEEN   Final   Culture   Final    NO GROWTH 4 DAYS NO ANAEROBES ISOLATED; CULTURE IN PROGRESS FOR 5 DAYS Performed at [facility], [address] [place], [place] [address]    Report Status PENDING  Incomplete  Aerobic Culture w Gram Stain (superficial specimen)  Status: None   Collection Time: 08/30/24 10:05 AM   Specimen: Wound  Result Value Ref Range Status   Specimen Description WOUND  Final   Special Requests LUMBAR EPIDURAL SPACE  Final   Gram Stain NO WBC SEEN NO ORGANISMS SEEN   Final   Culture   Final    NO GROWTH 2 DAYS Performed at [facility], [address], [place], [place] [address]    Report Status 09/01/2024 FINAL  Final    Radiology Studies: No results found.  Scheduled Meds:  carvedilol   12.5 mg Oral BID WC   ezetimibe   10 mg Oral Daily   gabapentin   300 mg Oral  QPM   HYDROcodone -acetaminophen   1 tablet Oral Q6H   hydroxychloroquine   200 mg Oral Daily   leflunomide   20 mg Oral Daily   melatonin  5 mg Oral QPM   multivitamin with minerals  1 tablet Oral Daily   rosuvastatin   20 mg Oral Daily   senna  1 tablet Oral BID   sodium chloride  flush  3 mL Intravenous Q12H   traZODone   100 mg Oral QHS   Continuous Infusions:   LOS: 5 days   Alejandro Marker, DO [facility] Available via Epic secure chat 7am-7pm After these hours, please refer to coverage provider listed on [email or web address] 09/03/2024, 4:57 PM

## 2024-09-04 DIAGNOSIS — D72829 Elevated white blood cell count, unspecified: Secondary | ICD-10-CM | POA: Diagnosis not present

## 2024-09-04 DIAGNOSIS — M4316 Spondylolisthesis, lumbar region: Secondary | ICD-10-CM | POA: Diagnosis not present

## 2024-09-04 DIAGNOSIS — R509 Fever, unspecified: Secondary | ICD-10-CM | POA: Diagnosis not present

## 2024-09-04 DIAGNOSIS — E876 Hypokalemia: Secondary | ICD-10-CM | POA: Diagnosis not present

## 2024-09-04 LAB — AEROBIC/ANAEROBIC CULTURE W GRAM STAIN (SURGICAL/DEEP WOUND)
Culture: NO GROWTH
Gram Stain: NONE SEEN

## 2024-09-04 MED ORDER — HYDROCODONE-ACETAMINOPHEN 7.5-325 MG PO TABS: 1.0000 | ORAL_TABLET | Freq: Four times a day (QID) | ORAL | 0 refills | Status: AC | PRN

## 2024-09-04 MED ORDER — HYDROCODONE-ACETAMINOPHEN 7.5-325 MG PO TABS: 1.0000 | ORAL_TABLET | Freq: Four times a day (QID) | ORAL | 0 refills | Status: DC | PRN

## 2024-09-04 NOTE — Progress Notes (Signed)
 PROGRESS NOTE    Samantha Mcbride  FMW:[id number] DOB: [birth year] DOA: 08/29/2024 PCP: Samantha Lenis, MD   Brief Narrative:  Samantha Mcbride 73 year old female who presented for worsening back pain and left leg weakness with reports of inability to ambulate.  Found to have concern for grade 1 anterolisthesis at L4-5 with severe spinal stenosis most recent imaging study for which neurosurgery admitted the patient and plan on possible intervention.  Rechecking MRI at this time and it was done without contrast but was reviewed by neurosurgery who felt that she has small abscesses within the muscle at L4-L5 and probable septic arthritis with epidural abscess with severe spinal stenosis.  Neurosurgery recommended L4-L5 hemilaminectomy and she underwent this along with a medial facetectomy foraminotomies with sublaminar decompression for central and right lateral recess decompression and is POD5.  PT/OT recommending SNF and she is stable from a medical standpoint.    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  Patient presents with worsening back pain and left foot drop.  Recent imaging showing grade 1 anterolisthesis at L4-5 with severe spinal stenosis.  Currently given Decadron  10 mg IV.   -MRI lumbar spine without contrast done and showed New 19 x 7 mm fluid collection posterior to the thecal sac at L4-L5, unlikely a synovial cyst since it was not present on the prior recent MRI, it could be a liquified hematoma or abscess but is certainly contributing to the patient's tight stenosis; Notably there was also Severe spinal canal and bilateral lateral recess stenosis at L4-L5 due to diffuse disc bulge, advanced facet arthropathy, and ligamentum flavum thickening and Stable posterior cystic structures at L5-S1, likely dissecting synovial cysts. -Neurosurgery evaluated the  patient for surgical intervention and she is status post left L4-L5 hemilaminectomy, medial facetectomy foraminotomies with sublaminar decompression for central and right lateral recess decompression -She will continue to follow on antibiotic course and will consider consulting ID pending on results. -Follow up on Aerobic and Aerobic/Anaerobic Cx and both showing NGTD so far  -Continued antibiotics with IV Ceftriaxone  2 g every 12 and IV Vancomycin  750 mg every 48 hours but now IV Vancomycin  is discontinued and just remains on CTX Monotherapy -Pain control with Acetaminophen  1000 mg p.o. Q6h scheduled, Hydrocodone -Acetaminophen  7.5-325 mg 1 tablet p.o. every 6 scheduled, and IV morphine  2 mg Q2 as needed for severe pain: Continue with supportive care with ondansetron  4 mg p.o./IV every 6 as needed nausea vomiting and bowel regimen senna 8.6 mg p.o. twice daily -Also continue with Tizanidine  2 mg p.o. every 8 as needed for muscle spasms -Will need PT/OT to Evaluate and Treat and they are recommending SNF; Neurosurgery feels that the foot drop is improving and they recommended continue work with therapy and obtaining TOC consult for rehab placement and she is medically stable for D/C and awaiting SNF placement and insurance auth submitted for Samantha Mcbride in [place]   ? For UTI given Leukocytosis and Low-grade fever, Improved  -Acute.  Patient noted to have a low-grade fever 100.1 F and WBC elevated at 15.3 on admission. This could possibly be secondary to atelectasis she has been in the bed here last couple of days versus possibility of underlying infection from above vs UTI. -Incentive spirometry and flutter valve -WBC went from 15.3 -> 12.9 -> 11.2 -> 8.7 -> 8.3 -> 6.8.   -Follow-up ESR was 47 and CRP was 3.5 -  Blood Culture x2 showed NGTD @ 5 Days ;  -Anaerobic/Aerobic Cx:  Gram Stain NO WBC SEEN NO ORGANISMS SEEN  Culture No growth aerobically or anaerobically. Performed at [facility]  Lab, [address] [place], [place] [address]  -Aerobic Cx w/ Gram Stain Negative @ 2 Days -Check CXR and U/A showed a cloudy appearance with small hemoglobin, large leukocytes, positive nitrites, rare bacteria, 11-20 RBCs per high-power field and 50 WBCs with urine culture not being obtained. -Chest x-ray done and showed No acute cardiopulmonary process but did show Stable ossific density along the shoulder. -CTM Cx and Temperature Curve   Hypokalemia: Mild and improved as K+ went from 3.1 -> 4.1 -> 3.9 -> 3.8 x2 -> 4.0. CTM and Replete as necessary. Mag Level is now 2.2. CTM and Replete as Necessary. Repeat CMP in the AM  ? Urinary Retention  Chronic Kidney Disease Stage IV / Metabolic Acidosis Patient noted to have some suprapubic tenderness on palpation on physical exam.  Creatinine noted to be 1.98 with BUN 20.  This appears to be around patient's baseline 1.6-2. BUN/Cr Trend fairly stable: Recent Labs  Lab 08/29/24 1141 08/29/24 1219 08/30/24 1416 08/31/24 0256 09/01/24 0543 09/02/24 0518 09/03/24 0452  BUN 20 22 23 23  27* 26* 25*  CREATININE 1.98* 2.10* 1.86* 1.82* 2.11* 2.25* 2.24*  -Check serial bladder scans for concern for urinary retention -Has a slight MA w/ a CO2 21, AG of 13, Chloride Level of 108 on last check -In-N-Out cath as needed/orders placed for Foley catheter persistent urinary retention -U/A as above -Avoid Nephrotoxic Medications, Contrast Dyes, Hypotension and Dehydration to Ensure Adequate Renal Perfusion and will need to Renally Adjust Meds. CTM and Trend Renal Function carefully and repeat CMP in the AM   Essential Hypertension: Blood pressures currently maintained. Continue Carvedilol  12.5 mg po BID. CTM BP per Protocol. Last BP reading was 128/57   COPD: No significant wheezes or rhonchi appreciated on physical exam at this time. -Breathing treatments as needed w/ Albuterol  2.5 mg Neb q4hprn Wheezing and SOB   Coronary Artery  Disease Hyperlipidemia Patient with a history of STEMI.  No anginal symptoms reported. -Held Clopidogrel  given need for Surgical Intervention and defer to NSGY when ok to Resume -Continue Beta-blocker w/ Carvedilol  12.5 mg po BID, Ezetimibe  10 mg po Daily, and Rosuvastatin  20 mg po Daily    Rheumatoid Arthritis:  Continue Hydroxychloroquine  200 mg po daily and Leflunomide  20 mg po daily    History of Breast Cancer: Noted  Normocytic Anemia: Hgb/Hct Trend stable and improved:  Recent Labs  Lab 08/29/24 1141 08/29/24 1219 08/30/24 1416 08/31/24 0256 09/01/24 0543 09/02/24 0518 09/03/24 0452  HGB 11.9* 11.6* 10.0* 9.1* 10.4* 9.7* 8.5*  HCT 37.5 34.0* 31.9* 28.9* 32.7* 31.4* 27.7*  MCV 94.2  --  96.4 96.7 95.9 96.3 97.2  -Checked Anemia Panel and showed an iron level 64, UIBC 125, TIBC 199, saturation ratios of 34, ferritin level 224, folate of 19.2 and a vitamin B12 of 527. CTM for S/Sx of Bleeding; No overt bleeding noted. Repeat CBC in the AM  Hypoalbuminemia: Patient's Albumin  Lvl went from 3.2 -> 2.7 -> 2.3 -> 2.8 -> 2.5 -> 2.1 on last check. CTM and Trend and repeat CMP in the AM  Class I Obesity: Complicates overall prognosis and care. Estimated body mass index is 36.62 kg/m as calculated from the following:   Height as of this encounter: 5' 1 (1.549 m).   Weight as of this  encounter: 87.9 kg. Weight Loss and Dietary Counseling given   DVT prophylaxis: SCD's Start: 08/30/24 1148    Code Status: Full Code Family Communication: No family present @ bedside   Disposition Plan:  Level of care: Med-Surg Status is: Inpatient Remains inpatient appropriate because: Medically stable for D/C   Consultants:  Neurosurgery (primary) TRH  Procedures:  As delineated as above;    PROCEDURE by Dr. Alm Molt on 08/30/24: Left L4-5 hemilaminectomy, medial facetectomy foraminotomies with sublaminar decompression for central and right lateral recess decompression      Antimicrobials:  Anti-infectives (From admission, onward)    Start     Dose/Rate Route Frequency Ordered Stop   09/01/24 1000  vancomycin  (VANCOREADY) IVPB 750 mg/150 mL  Status:  Discontinued        750 mg 150 mL/hr over 60 Minutes Intravenous Every 48 hours 08/30/24 1258 08/31/24 1355   09/01/24 1000  cefTRIAXone  (ROCEPHIN ) 2 g in sodium chloride  0.9 % 100 mL IVPB  Status:  Discontinued        2 g 200 mL/hr over 30 Minutes Intravenous Every 24 hours 08/31/24 1324 09/03/24 0804   08/30/24 1345  vancomycin  (VANCOREADY) IVPB 750 mg/150 mL        750 mg 150 mL/hr over 60 Minutes Intravenous STAT 08/30/24 1258 08/30/24 1448   08/30/24 1245  cefTRIAXone  (ROCEPHIN ) 2 g in sodium chloride  0.9 % 100 mL IVPB  Status:  Discontinued        2 g 200 mL/hr over 30 Minutes Intravenous Every 12 hours 08/30/24 1147 08/31/24 1324   08/30/24 0915  vancomycin  (VANCOCIN ) IVPB 1000 mg/200 mL premix        1,000 mg 200 mL/hr over 60 Minutes Intravenous  Once 08/30/24 0909 08/30/24 1120   08/30/24 0915  cefTRIAXone  (ROCEPHIN ) 1 g in sodium chloride  0.9 % 100 mL IVPB        1 g 200 mL/hr over 30 Minutes Intravenous  Once 08/30/24 0909 08/30/24 1844   08/29/24 2015  hydroxychloroquine  (PLAQUENIL ) tablet 200 mg        200 mg Oral Daily 08/29/24 1920         Subjective: Seen and examined at bedside and felt okay in no acute distress but was complaining about pain in her throat given that she had just choked on a piece of meat.  States that she is able to clear it with some water but still feels a globus sensation.  Pain is fairly well-controlled.  Going to SNF today.  No nausea or vomiting.  Denied any lightheadedness or dizziness.  No other concerns or complaints at this time.  Objective: Vitals:   09/03/24 2236 09/03/24 2247 09/04/24 0553 09/04/24 1016  BP: (!) 142/66  137/65 (!) 128/57  Pulse: 77  77 79  Resp: 18  18 17   Temp: 98.2 F (36.8 C)  (!) 97.5 F (36.4 C) 98.7 F (37.1 C)  TempSrc: Oral  Oral Oral Oral  SpO2: 94%  93% 92%  Weight:      Height:        Intake/Output Summary (Last 24 hours) at 09/04/2024 1506 Last data filed at 09/04/2024 0600 Gross per 24 hour  Intake 360 ml  Output --  Net 360 ml   Filed Weights   09/02/24 1800  Weight: 87.9 kg   Examination: Physical Exam:  Constitutional: WN/WD obese chronically ill-appearing Caucasian female sitting up in the chair eating Respiratory: Diminished to auscultation bilaterally, no wheezing, rales, rhonchi or crackles. Normal  respiratory effort and patient is not tachypenic. No accessory muscle use.  Unlabored breathing Cardiovascular: RRR, no murmurs / rubs / gallops. S1 and S2 auscultated. No extremity edema. Abdomen: Soft, non-tender, distended secondary to body habitus.  Bowel sounds positive.  GU: Deferred. Musculoskeletal: No clubbing / cyanosis of digits/nails. No joint deformity upper and lower extremities.  Skin: No rashes, lesions, ulcers on limited skin evaluation. No induration; Warm and dry.  Neurologic: CN 2-12 grossly intact with no focal deficits. Romberg sign and cerebellar reflexes not assessed.  Psychiatric: Normal judgment and insight. Alert and oriented x 3. Normal mood and appropriate affect.   Data Reviewed: I have personally reviewed following labs and imaging studies  CBC: Recent Labs  Lab 08/30/24 1416 08/31/24 0256 09/01/24 0543 09/02/24 0518 09/03/24 0452  WBC 12.9* 11.2* 8.7 8.3 6.8  NEUTROABS 11.8* 9.8* 6.8 6.4 4.9  HGB 10.0* 9.1* 10.4* 9.7* 8.5*  HCT 31.9* 28.9* 32.7* 31.4* 27.7*  MCV 96.4 96.7 95.9 96.3 97.2  PLT 180 180 196 172 154   Basic Metabolic Panel: Recent Labs  Lab 08/30/24 1416 08/31/24 0256 09/01/24 0543 09/02/24 0518 09/03/24 0452  NA 141 141 140 142 142  K 4.1 3.9 3.8 3.8 4.0  CL 109 108 104 108 108  CO2 21* 22 22 24  21*  GLUCOSE 154* 185* 88 92 95  BUN 23 23 27* 26* 25*  CREATININE 1.86* 1.82* 2.11* 2.25* 2.24*  CALCIUM  8.3* 8.1* 8.6* 8.2* 8.1*   MG 2.4 2.2 2.4 2.3 2.3  PHOS 2.6 2.9 3.0 3.0 3.2   GFR: Estimated Creatinine Clearance: 22.9 mL/min (A) (by C-G formula based on SCr of 2.24 mg/dL (H)). Liver Function Tests: Recent Labs  Lab 08/30/24 1416 08/31/24 0256 09/01/24 0543 09/02/24 0518 09/03/24 0452  AST 16 14* 13* 12* 14*  ALT 10 9 10 10 11   ALKPHOS 48 45 48 47 42  BILITOT 0.5 0.3 0.4 0.5 0.4  PROT 5.5* 4.9* 5.4* 5.1* 4.5*  ALBUMIN  2.7* 2.3* 2.8* 2.5* 2.1*   No results for input(s): LIPASE, AMYLASE in the last 168 hours. No results for input(s): AMMONIA in the last 168 hours. Coagulation Profile: No results for input(s): INR, PROTIME in the last 168 hours. Cardiac Enzymes: No results for input(s): CKTOTAL, CKMB, CKMBINDEX, TROPONINI in the last 168 hours. BNP (last 3 results) No results for input(s): PROBNP in the last 8760 hours. HbA1C: No results for input(s): HGBA1C in the last 72 hours. CBG: No results for input(s): GLUCAP in the last 168 hours. Lipid Profile: No results for input(s): CHOL, HDL, LDLCALC, TRIG, CHOLHDL, LDLDIRECT in the last 72 hours. Thyroid  Function Tests: No results for input(s): TSH, T4TOTAL, FREET4, T3FREE, THYROIDAB in the last 72 hours. Anemia Panel: No results for input(s): VITAMINB12, FOLATE, FERRITIN, TIBC, IRON, RETICCTPCT in the last 72 hours. Sepsis Labs: No results for input(s): PROCALCITON, LATICACIDVEN in the last 168 hours.  Recent Results (from the past 240 hours)  Resp panel by RT-PCR (RSV, Flu A&B, Covid) Anterior Nasal Swab     Status: None   Collection Time: 08/29/24  3:32 PM   Specimen: Anterior Nasal Swab  Result Value Ref Range Status   SARS Coronavirus 2 by RT PCR NEGATIVE NEGATIVE Final   Influenza A by PCR NEGATIVE NEGATIVE Final   Influenza B by PCR NEGATIVE NEGATIVE Final    Comment: (NOTE) The Xpert Xpress SARS-CoV-2/FLU/RSV plus assay is intended as an aid in the diagnosis of influenza  from Nasopharyngeal swab specimens and should not  be used as a sole basis for treatment. Nasal washings and aspirates are unacceptable for Xpert Xpress SARS-CoV-2/FLU/RSV testing.  Fact Sheet for Patients: bloggercourse.com  Fact Sheet for Healthcare Providers: seriousbroker.it  This test is not yet approved or cleared by the [place]  FDA and has been authorized for detection and/or diagnosis of SARS-CoV-2 by FDA under an Emergency Use Authorization (EUA). This EUA will remain in effect (meaning this test can be used) for the duration of the COVID-19 declaration under Section 564(b)(1) of the Act, 21 U.S.C. section 360bbb-3(b)(1), unless the authorization is terminated or revoked.     Resp Syncytial Virus by PCR NEGATIVE NEGATIVE Final    Comment: (NOTE) Fact Sheet for Patients: bloggercourse.com  Fact Sheet for Healthcare Providers: seriousbroker.it  This test is not yet approved or cleared by the [place]  FDA and has been authorized for detection and/or diagnosis of SARS-CoV-2 by FDA under an Emergency Use Authorization (EUA). This EUA will remain in effect (meaning this test can be used) for the duration of the COVID-19 declaration under Section 564(b)(1) of the Act, 21 U.S.C. section 360bbb-3(b)(1), unless the authorization is terminated or revoked.  Performed at [facility], [address], [place], [place] [address]   Culture, blood (Routine X 2) w Reflex to ID Panel     Status: None   Collection Time: 08/29/24  6:48 PM   Specimen: BLOOD LEFT HAND  Result Value Ref Range Status   Specimen Description BLOOD LEFT HAND  Final   Special Requests   Final    BOTTLES DRAWN AEROBIC AND ANAEROBIC Blood Culture results may not be optimal due to an inadequate volume of blood received in culture bottles   Culture   Final    NO GROWTH 5 DAYS Performed at [facility], [address] [place], [place] [address]    Report Status 09/03/2024 FINAL  Final  Culture, blood (Routine X 2) w Reflex to ID Panel     Status: None   Collection Time: 08/29/24  6:48 PM   Specimen: BLOOD  Result Value Ref Range Status   Specimen Description BLOOD  Final   Special Requests   Final    AEROBIC BOTTLE ONLY Blood Culture results may not be optimal due to an inadequate volume of blood received in culture bottles   Culture   Final    NO GROWTH 5 DAYS Performed at [facility], [address] [place], [place] [address]    Report Status 09/03/2024 FINAL  Final  Surgical PCR screen     Status: None   Collection Time: 08/29/24 10:55 PM   Specimen: Nasal Mucosa; Nasal Swab  Result Value Ref Range Status   MRSA, PCR NEGATIVE NEGATIVE Final   Staphylococcus aureus NEGATIVE NEGATIVE Final    Comment: (NOTE) The Xpert SA Assay (FDA approved for NASAL specimens in patients 44 years of age and older), is one component of a comprehensive surveillance program. It is not intended to diagnose infection nor to guide or monitor treatment. Performed at [facility], [address], [place], [place] [address]   Aerobic/Anaerobic Culture w Gram Stain (surgical/deep wound)     Status: None   Collection Time: 08/30/24 10:01 AM   Specimen: Soft Tissue, Other  Result Value Ref Range Status   Specimen Description TISSUE  Final   Special Requests LUMBAR EPIDURAL TISSUE  Final   Gram Stain NO WBC SEEN NO ORGANISMS SEEN   Final  Culture   Final    No growth aerobically or anaerobically. Performed at [facility], [address], [place], [place] [address]    Report Status 09/04/2024 FINAL  Final  Aerobic Culture w Gram Stain (superficial specimen)     Status: None   Collection Time: 08/30/24 10:05 AM   Specimen: Wound  Result Value Ref Range Status   Specimen Description WOUND  Final   Special Requests LUMBAR EPIDURAL SPACE  Final   Gram Stain NO  WBC SEEN NO ORGANISMS SEEN   Final   Culture   Final    NO GROWTH 2 DAYS Performed at [facility], [address] [place], [place] [address]    Report Status 09/01/2024 FINAL  Final    Radiology Studies: No results found.  Scheduled Meds:  carvedilol   12.5 mg Oral BID WC   ezetimibe   10 mg Oral Daily   gabapentin   300 mg Oral QPM   HYDROcodone -acetaminophen   1 tablet Oral Q6H   hydroxychloroquine   200 mg Oral Daily   leflunomide   20 mg Oral Daily   melatonin  5 mg Oral QPM   multivitamin with minerals  1 tablet Oral Daily   rosuvastatin   20 mg Oral Daily   senna  1 tablet Oral BID   sodium chloride  flush  3 mL Intravenous Q12H   traZODone   100 mg Oral QHS   Continuous Infusions:   LOS: 6 days   Alejandro Marker, DO [facility] Available via Epic secure chat 7am-7pm After these hours, please refer to coverage provider listed on [email or web address] 09/04/2024, 3:06 PM

## 2024-09-04 NOTE — Progress Notes (Signed)
 Occupational Therapy Treatment Patient Details Name: Samantha Mcbride MRN: [id number] DOB: 01/23/1951 Today's Date: 09/04/2024   History of present illness Pt is a 73 y/o female who presents s/p L L4-5 hemilaminectomy, medial facetectomy foraminotomies with sublaminar decompression for central and right lateral recess decompression on 08/30/2024. PMH significant for CA, CKD IV, HTN, RA, DOE, IM nail L 2024.   OT comments  Pt. Seen for skilled OT treatment session.  Pt. Able to complete LB dressing with MODA.  Greater need for assistance with LLE VS RLE.  In room ambulation to/from b.room for toileting with CGA/S.  Peri care set up seated.  Bed mobility with S and light cues for log roll tech.  Pt. Making great gains.  Cont. With acute OT POC.        If plan is discharge home, recommend the following:  A little help with walking and/or transfers;A little help with bathing/dressing/bathroom;Assistance with cooking/housework;Assist for transportation;Help with stairs or ramp for entrance   Equipment Recommendations  Other (comment)    Recommendations for Other Services      Precautions / Restrictions Precautions Precautions: Back;Fall Precaution/Restrictions Comments: cues for precautions during functional mobility       Mobility Bed Mobility Overal bed mobility: Needs Assistance Bed Mobility: Sit to Sidelying, Rolling Rolling: Supervision       Sit to sidelying: Contact guard assist General bed mobility comments: cues for sequencing of log roll but pt. with good compliance of back precautions during bed mobility    Transfers Overall transfer level: Needs assistance Equipment used: Rolling walker (2 wheels) Transfers: Sit to/from Stand, Bed to chair/wheelchair/BSC Sit to Stand: Contact guard assist     Step pivot transfers: Contact guard assist     General transfer comment: VC's for hand placement on seated surface for safety. Hands on guarding for safety.     Balance                                            ADL either performed or assessed with clinical judgement   ADL Overall ADL's : Needs assistance/impaired                     Lower Body Dressing: Set up;Moderate assistance;Sitting/lateral leans Lower Body Dressing Details (indicate cue type and reason): able to reach RLE with figure four but not LLE mod a for LLE Toilet Transfer: Minimal assistance;Ambulation;Rolling walker (2 wheels);Comfort height toilet;Grab bars;Cueing for sequencing   Toileting- Clothing Manipulation and Hygiene: Set up;Sitting/lateral lean              Extremity/Trunk Assessment              Vision       Perception     Praxis     Communication Communication Communication: No apparent difficulties   Cognition Arousal: Alert Behavior During Therapy: WFL for tasks assessed/performed Cognition: No apparent impairments                               Following commands: Intact        Cueing   Cueing Techniques: Verbal cues, Gestural cues  Exercises      Shoulder Instructions       General Comments      Pertinent Vitals/ Pain       Pain Assessment  Pain Assessment: No/denies pain  Home Living                                          Prior Functioning/Environment              Frequency  Min 2X/week        Progress Toward Goals  OT Goals(current goals can now be found in the care plan section)  Progress towards OT goals: Progressing toward goals     Plan      Co-evaluation                 AM-PAC OT 6 Clicks Daily Activity     Outcome Measure   Help from another person eating meals?: None Help from another person taking care of personal grooming?: A Little Help from another person toileting, which includes using toliet, bedpan, or urinal?: A Lot Help from another person bathing (including washing, rinsing, drying)?: A Lot Help from another person to put on  and taking off regular upper body clothing?: A Little Help from another person to put on and taking off regular lower body clothing?: A Lot 6 Click Score: 16    End of Session Equipment Utilized During Treatment: Rolling walker (2 wheels)  OT Visit Diagnosis: Unsteadiness on feet (R26.81);Repeated falls (R29.6);Muscle weakness (generalized) (M62.81);History of falling (Z91.81);Pain   Activity Tolerance Patient tolerated treatment well   Patient Left in bed;with call bell/phone within reach;with bed alarm set   Nurse Communication Other (comment) (rn states ok to work with pt.)        Time: 8669-8661 OT Time Calculation (min): 8 min  Charges: OT General Charges $OT Visit: 1 Visit OT Treatments $Self Care/Home Management : 8-22 mins  [place], COTA/L Acute Rehabilitation [phone]   Samantha Mcbride  09/04/2024, 1:50 PM

## 2024-09-04 NOTE — Plan of Care (Signed)
  Problem: Education: Goal: Knowledge of General Education information will improve Description: Including pain rating scale, medication(s)/side effects and non-pharmacologic comfort measures Outcome: Progressing   Problem: Clinical Measurements: Goal: Ability to maintain clinical measurements within normal limits will improve Outcome: Progressing   Problem: Activity: Goal: Risk for activity intolerance will decrease Outcome: Progressing   Problem: Pain Managment: Goal: General experience of comfort will improve and/or be controlled Outcome: Progressing   Problem: Safety: Goal: Ability to remain free from injury will improve Outcome: Progressing   Problem: Education: Goal: Ability to verbalize activity precautions or restrictions will improve Outcome: Progressing   Problem: Activity: Goal: Ability to avoid complications of mobility impairment will improve Outcome: Progressing Goal: Will remain free from falls Outcome: Progressing   Problem: Bowel/Gastric: Goal: Gastrointestinal status for postoperative course will improve Outcome: Progressing   Problem: Skin Integrity: Goal: Will show signs of wound healing Outcome: Progressing

## 2024-09-04 NOTE — Discharge Summary (Signed)
 Physician Discharge Summary  Patient ID: Samantha Mcbride MRN: [id number] DOB/AGE: Sep 02, 1951 72 y.o.  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 The patient tolerated the procedure well and was taken to the recovery room and then to the floor in stable condition. The hospital course was routine. There were no complications. The wound remained clean dry and intact. Pt had appropriate back soreness. No complaints of leg pain or new N/T/W. Her pre-op foot drop was better bilaterally. All cultures negative. Hospitalists followed for medical care. The patient remained afebrile with stable vital signs, and tolerated a regular diet. The patient continued to increase activities, and pain was well controlled with oral pain medications.   Consults: medicine  Significant Diagnostic Studies:  Results for orders placed or performed during the hospital encounter of 08/29/24  CBC with Differential   Collection Time: 08/29/24 11:41 AM  Result Value Ref Range   WBC 15.3 (H) 4.0 - 10.5 K/uL   RBC 3.98 3.87 - 5.11 MIL/uL   Hemoglobin 11.9 (L) 12.0 - 15.0 g/dL   HCT 62.4 63.9 - 53.9 %   MCV 94.2 80.0 - 100.0 fL   MCH 29.9 26.0 - 34.0 pg   MCHC 31.7 30.0 - 36.0 g/dL   RDW 86.5 88.4 - 84.4 %   Platelets 180 150 - 400 K/uL   nRBC 0.0 0.0 - 0.2 %   Neutrophils Relative % 88 %   Neutro Abs 13.3 (H) 1.7 - 7.7 K/uL   Lymphocytes Relative 6 %   Lymphs Abs 1.0 0.7 - 4.0 K/uL   Monocytes Relative 6 %   Monocytes Absolute 0.9 0.1 - 1.0 K/uL   Eosinophils Relative 0 %   Eosinophils Absolute 0.0 0.0 - 0.5 K/uL   Basophils Relative 0 %   Basophils Absolute 0.1 0.0 - 0.1 K/uL   Immature Granulocytes 0 %   Abs Immature  Granulocytes 0.05 0.00 - 0.07 K/uL  Comprehensive metabolic panel   Collection Time: 08/29/24 11:41 AM  Result Value Ref Range   Sodium 141 135 - 145 mmol/L   Potassium 3.2 (L) 3.5 - 5.1 mmol/L   Chloride 105 98 - 111 mmol/L   CO2 23 22 - 32 mmol/L   Glucose, Bld 106 (H) 70 - 99 mg/dL   BUN 20 8 - 23 mg/dL   Creatinine, Ser 8.01 (H) 0.44 - 1.00 mg/dL   Calcium  9.2 8.9 - 10.3 mg/dL   Total Protein 6.2 (L) 6.5 - 8.1 g/dL   Albumin  3.2 (L) 3.5 - 5.0 g/dL   AST 15 15 - 41 U/L   ALT 10 0 - 44 U/L   Alkaline Phosphatase 56 38 - 126 U/L   Total Bilirubin 0.3 0.0 - 1.2 mg/dL   GFR, Estimated 26 (L) >60 mL/min   Anion gap 13 5 - 15  I-stat chem 8, ED (not at [facility], [facility] or [facility])   Collection Time: 08/29/24 12:19 PM  Result Value Ref Range   Sodium 138 135 - 145 mmol/L   Potassium 3.1 (L) 3.5 - 5.1 mmol/L   Chloride 109 98 - 111 mmol/L   BUN 22 8 - 23 mg/dL   Creatinine, Ser 7.89 (H) 0.44 - 1.00 mg/dL   Glucose, Bld 896 (H) 70 - 99  mg/dL   Calcium , Ion 0.92 (L) 1.15 - 1.40 mmol/L   TCO2 23 22 - 32 mmol/L   Hemoglobin 11.6 (L) 12.0 - 15.0 g/dL   HCT 65.9 (L) 63.9 - 53.9 %  Resp panel by RT-PCR (RSV, Flu A&B, Covid) Anterior Nasal Swab   Collection Time: 08/29/24  3:32 PM   Specimen: Anterior Nasal Swab  Result Value Ref Range   SARS Coronavirus 2 by RT PCR NEGATIVE NEGATIVE   Influenza A by PCR NEGATIVE NEGATIVE   Influenza B by PCR NEGATIVE NEGATIVE   Resp Syncytial Virus by PCR NEGATIVE NEGATIVE  Sedimentation rate   Collection Time: 08/29/24  4:00 PM  Result Value Ref Range   Sed Rate 47 (H) 0 - 22 mm/hr  C-reactive protein   Collection Time: 08/29/24  4:00 PM  Result Value Ref Range   CRP 3.5 (H) <1.0 mg/dL  Urinalysis, Routine w reflex microscopic -Urine, Clean Catch   Collection Time: 08/29/24  4:48 PM  Result Value Ref Range   Color, Urine YELLOW YELLOW   APPearance CLOUDY (A) CLEAR   Specific Gravity, Urine 1.011 1.005 - 1.030   pH 5.0 5.0 - 8.0   Glucose, UA  NEGATIVE NEGATIVE mg/dL   Hgb urine dipstick SMALL (A) NEGATIVE   Bilirubin Urine NEGATIVE NEGATIVE   Ketones, ur NEGATIVE NEGATIVE mg/dL   Protein, ur 30 (A) NEGATIVE mg/dL   Nitrite POSITIVE (A) NEGATIVE   Leukocytes,Ua LARGE (A) NEGATIVE   RBC / HPF 11-20 0 - 5 RBC/hpf   WBC, UA >50 0 - 5 WBC/hpf   Bacteria, UA RARE (A) NONE SEEN   Squamous Epithelial / HPF 0-5 0 - 5 /HPF   Mucus PRESENT   Culture, blood (Routine X 2) w Reflex to ID Panel   Collection Time: 08/29/24  6:48 PM   Specimen: BLOOD LEFT HAND  Result Value Ref Range   Specimen Description BLOOD LEFT HAND    Special Requests      BOTTLES DRAWN AEROBIC AND ANAEROBIC Blood Culture results may not be optimal due to an inadequate volume of blood received in culture bottles   Culture      NO GROWTH 5 DAYS Performed at [facility], [address] [place], [place] [address]    Report Status 09/03/2024 FINAL   Culture, blood (Routine X 2) w Reflex to ID Panel   Collection Time: 08/29/24  6:48 PM   Specimen: BLOOD  Result Value Ref Range   Specimen Description BLOOD    Special Requests      AEROBIC BOTTLE ONLY Blood Culture results may not be optimal due to an inadequate volume of blood received in culture bottles   Culture      NO GROWTH 5 DAYS Performed at [facility], [address], [place], [place] [address]    Report Status 09/03/2024 FINAL   Surgical PCR screen   Collection Time: 08/29/24 10:55 PM   Specimen: Nasal Mucosa; Nasal Swab  Result Value Ref Range   MRSA, PCR NEGATIVE NEGATIVE   Staphylococcus aureus NEGATIVE NEGATIVE  Type and screen [facility]   Collection Time: 08/30/24  8:45 AM  Result Value Ref Range   ABO/RH(D) B POS    Antibody Screen NEG    Sample Expiration      09/02/2024,2359 Performed at [facility], [address] [place], [place] [address]   ABO/Rh   Collection Time: 08/30/24  9:00 AM  Result Value Ref  Range   ABO/RH(D)      B  POS Performed at [facility], [address], [place], [place] [address]   Aerobic/Anaerobic Culture w Gram Stain (surgical/deep wound)   Collection Time: 08/30/24 10:01 AM   Specimen: Soft Tissue, Other  Result Value Ref Range   Specimen Description TISSUE    Special Requests LUMBAR EPIDURAL TISSUE    Gram Stain NO WBC SEEN NO ORGANISMS SEEN     Culture      NO GROWTH 4 DAYS NO ANAEROBES ISOLATED; CULTURE IN PROGRESS FOR 5 DAYS Performed at [facility], [address], [place], [place] [address]    Report Status PENDING   Aerobic Culture w Gram Stain (superficial specimen)   Collection Time: 08/30/24 10:05 AM   Specimen: Wound  Result Value Ref Range   Specimen Description WOUND    Special Requests LUMBAR EPIDURAL SPACE    Gram Stain NO WBC SEEN NO ORGANISMS SEEN     Culture      NO GROWTH 2 DAYS Performed at [facility], [address], [place], [place] [address]    Report Status 09/01/2024 FINAL   CBC with Differential/Platelet   Collection Time: 08/30/24  2:16 PM  Result Value Ref Range   WBC 12.9 (H) 4.0 - 10.5 K/uL   RBC 3.31 (L) 3.87 - 5.11 MIL/uL   Hemoglobin 10.0 (L) 12.0 - 15.0 g/dL   HCT 68.0 (L) 63.9 - 53.9 %   MCV 96.4 80.0 - 100.0 fL   MCH 30.2 26.0 - 34.0 pg   MCHC 31.3 30.0 - 36.0 g/dL   RDW 86.6 88.4 - 84.4 %   Platelets 180 150 - 400 K/uL   nRBC 0.0 0.0 - 0.2 %   Neutrophils Relative % 91 %   Neutro Abs 11.8 (H) 1.7 - 7.7 K/uL   Lymphocytes Relative 5 %   Lymphs Abs 0.6 (L) 0.7 - 4.0 K/uL   Monocytes Relative 3 %   Monocytes Absolute 0.4 0.1 - 1.0 K/uL   Eosinophils Relative 0 %   Eosinophils Absolute 0.0 0.0 - 0.5 K/uL   Basophils Relative 0 %   Basophils Absolute 0.0 0.0 - 0.1 K/uL   Immature Granulocytes 1 %   Abs Immature Granulocytes 0.09 (H) 0.00 - 0.07 K/uL  Comprehensive metabolic panel   Collection Time: 08/30/24  2:16 PM  Result Value Ref Range   Sodium 141 135 - 145 mmol/L   Potassium 4.1 3.5 - 5.1 mmol/L    Chloride 109 98 - 111 mmol/L   CO2 21 (L) 22 - 32 mmol/L   Glucose, Bld 154 (H) 70 - 99 mg/dL   BUN 23 8 - 23 mg/dL   Creatinine, Ser 8.13 (H) 0.44 - 1.00 mg/dL   Calcium  8.3 (L) 8.9 - 10.3 mg/dL   Total Protein 5.5 (L) 6.5 - 8.1 g/dL   Albumin  2.7 (L) 3.5 - 5.0 g/dL   AST 16 15 - 41 U/L   ALT 10 0 - 44 U/L   Alkaline Phosphatase 48 38 - 126 U/L   Total Bilirubin 0.5 0.0 - 1.2 mg/dL   GFR, Estimated 28 (L) >60 mL/min   Anion gap 11 5 - 15  Magnesium   Collection Time: 08/30/24  2:16 PM  Result Value Ref Range   Magnesium 2.4 1.7 - 2.4 mg/dL  Phosphorus   Collection Time: 08/30/24  2:16 PM  Result Value Ref Range   Phosphorus 2.6 2.5 - 4.6  mg/dL  CBC with Differential/Platelet   Collection Time: 08/31/24  2:56 AM  Result Value Ref Range   WBC 11.2 (H) 4.0 - 10.5 K/uL   RBC 2.99 (L) 3.87 - 5.11 MIL/uL   Hemoglobin 9.1 (L) 12.0 - 15.0 g/dL   HCT 71.0 (L) 63.9 - 53.9 %   MCV 96.7 80.0 - 100.0 fL   MCH 30.4 26.0 - 34.0 pg   MCHC 31.5 30.0 - 36.0 g/dL   RDW 86.5 88.4 - 84.4 %   Platelets 180 150 - 400 K/uL   nRBC 0.0 0.0 - 0.2 %   Neutrophils Relative % 88 %   Neutro Abs 9.8 (H) 1.7 - 7.7 K/uL   Lymphocytes Relative 6 %   Lymphs Abs 0.7 0.7 - 4.0 K/uL   Monocytes Relative 5 %   Monocytes Absolute 0.5 0.1 - 1.0 K/uL   Eosinophils Relative 0 %   Eosinophils Absolute 0.0 0.0 - 0.5 K/uL   Basophils Relative 0 %   Basophils Absolute 0.0 0.0 - 0.1 K/uL   Immature Granulocytes 1 %   Abs Immature Granulocytes 0.09 (H) 0.00 - 0.07 K/uL  Comprehensive metabolic panel with GFR   Collection Time: 08/31/24  2:56 AM  Result Value Ref Range   Sodium 141 135 - 145 mmol/L   Potassium 3.9 3.5 - 5.1 mmol/L   Chloride 108 98 - 111 mmol/L   CO2 22 22 - 32 mmol/L   Glucose, Bld 185 (H) 70 - 99 mg/dL   BUN 23 8 - 23 mg/dL   Creatinine, Ser 8.17 (H) 0.44 - 1.00 mg/dL   Calcium  8.1 (L) 8.9 - 10.3 mg/dL   Total Protein 4.9 (L) 6.5 - 8.1 g/dL   Albumin  2.3 (L) 3.5 - 5.0 g/dL   AST 14 (L)  15 - 41 U/L   ALT 9 0 - 44 U/L   Alkaline Phosphatase 45 38 - 126 U/L   Total Bilirubin 0.3 0.0 - 1.2 mg/dL   GFR, Estimated 29 (L) >60 mL/min   Anion gap 11 5 - 15  Phosphorus   Collection Time: 08/31/24  2:56 AM  Result Value Ref Range   Phosphorus 2.9 2.5 - 4.6 mg/dL  Magnesium   Collection Time: 08/31/24  2:56 AM  Result Value Ref Range   Magnesium 2.2 1.7 - 2.4 mg/dL  CBC with Differential/Platelet   Collection Time: 09/01/24  5:43 AM  Result Value Ref Range   WBC 8.7 4.0 - 10.5 K/uL   RBC 3.41 (L) 3.87 - 5.11 MIL/uL   Hemoglobin 10.4 (L) 12.0 - 15.0 g/dL   HCT 67.2 (L) 63.9 - 53.9 %   MCV 95.9 80.0 - 100.0 fL   MCH 30.5 26.0 - 34.0 pg   MCHC 31.8 30.0 - 36.0 g/dL   RDW 86.5 88.4 - 84.4 %   Platelets 196 150 - 400 K/uL   nRBC 0.0 0.0 - 0.2 %   Neutrophils Relative % 78 %   Neutro Abs 6.8 1.7 - 7.7 K/uL   Lymphocytes Relative 13 %   Lymphs Abs 1.1 0.7 - 4.0 K/uL   Monocytes Relative 7 %   Monocytes Absolute 0.6 0.1 - 1.0 K/uL   Eosinophils Relative 1 %   Eosinophils Absolute 0.0 0.0 - 0.5 K/uL   Basophils Relative 0 %   Basophils Absolute 0.0 0.0 - 0.1 K/uL   Immature Granulocytes 1 %   Abs Immature Granulocytes 0.11 (H) 0.00 - 0.07 K/uL  Comprehensive metabolic panel with GFR  Collection Time: 09/01/24  5:43 AM  Result Value Ref Range   Sodium 140 135 - 145 mmol/L   Potassium 3.8 3.5 - 5.1 mmol/L   Chloride 104 98 - 111 mmol/L   CO2 22 22 - 32 mmol/L   Glucose, Bld 88 70 - 99 mg/dL   BUN 27 (H) 8 - 23 mg/dL   Creatinine, Ser 7.88 (H) 0.44 - 1.00 mg/dL   Calcium  8.6 (L) 8.9 - 10.3 mg/dL   Total Protein 5.4 (L) 6.5 - 8.1 g/dL   Albumin  2.8 (L) 3.5 - 5.0 g/dL   AST 13 (L) 15 - 41 U/L   ALT 10 0 - 44 U/L   Alkaline Phosphatase 48 38 - 126 U/L   Total Bilirubin 0.4 0.0 - 1.2 mg/dL   GFR, Estimated 24 (L) >60 mL/min   Anion gap 14 5 - 15  Phosphorus   Collection Time: 09/01/24  5:43 AM  Result Value Ref Range   Phosphorus 3.0 2.5 - 4.6 mg/dL  Magnesium    Collection Time: 09/01/24  5:43 AM  Result Value Ref Range   Magnesium 2.4 1.7 - 2.4 mg/dL  Vitamin A87   Collection Time: 09/01/24  5:43 AM  Result Value Ref Range   Vitamin B-12 527 180 - 914 pg/mL  Folate   Collection Time: 09/01/24  5:43 AM  Result Value Ref Range   Folate 19.2 >5.9 ng/mL  Iron and TIBC   Collection Time: 09/01/24  5:43 AM  Result Value Ref Range   Iron 64 28 - 170 ug/dL   TIBC 810 (L) 749 - 549 ug/dL   Saturation Ratios 34 (H) 10.4 - 31.8 %   UIBC 125 ug/dL  Ferritin   Collection Time: 09/01/24  5:43 AM  Result Value Ref Range   Ferritin 224 11 - 307 ng/mL  Reticulocytes   Collection Time: 09/01/24  5:43 AM  Result Value Ref Range   Retic Ct Pct 1.7 0.4 - 3.1 %   RBC. 3.35 (L) 3.87 - 5.11 MIL/uL   Retic Count, Absolute 57.0 19.0 - 186.0 K/uL   Immature Retic Fract 9.6 2.3 - 15.9 %  CBC with Differential/Platelet   Collection Time: 09/02/24  5:18 AM  Result Value Ref Range   WBC 8.3 4.0 - 10.5 K/uL   RBC 3.26 (L) 3.87 - 5.11 MIL/uL   Hemoglobin 9.7 (L) 12.0 - 15.0 g/dL   HCT 68.5 (L) 63.9 - 53.9 %   MCV 96.3 80.0 - 100.0 fL   MCH 29.8 26.0 - 34.0 pg   MCHC 30.9 30.0 - 36.0 g/dL   RDW 86.6 88.4 - 84.4 %   Platelets 172 150 - 400 K/uL   nRBC 0.0 0.0 - 0.2 %   Neutrophils Relative % 77 %   Neutro Abs 6.4 1.7 - 7.7 K/uL   Lymphocytes Relative 14 %   Lymphs Abs 1.1 0.7 - 4.0 K/uL   Monocytes Relative 7 %   Monocytes Absolute 0.6 0.1 - 1.0 K/uL   Eosinophils Relative 1 %   Eosinophils Absolute 0.1 0.0 - 0.5 K/uL   Basophils Relative 0 %   Basophils Absolute 0.0 0.0 - 0.1 K/uL   Immature Granulocytes 1 %   Abs Immature Granulocytes 0.07 0.00 - 0.07 K/uL  Comprehensive metabolic panel with GFR   Collection Time: 09/02/24  5:18 AM  Result Value Ref Range   Sodium 142 135 - 145 mmol/L   Potassium 3.8 3.5 - 5.1 mmol/L   Chloride 108  98 - 111 mmol/L   CO2 24 22 - 32 mmol/L   Glucose, Bld 92 70 - 99 mg/dL   BUN 26 (H) 8 - 23 mg/dL   Creatinine,  Ser 7.74 (H) 0.44 - 1.00 mg/dL   Calcium  8.2 (L) 8.9 - 10.3 mg/dL   Total Protein 5.1 (L) 6.5 - 8.1 g/dL   Albumin  2.5 (L) 3.5 - 5.0 g/dL   AST 12 (L) 15 - 41 U/L   ALT 10 0 - 44 U/L   Alkaline Phosphatase 47 38 - 126 U/L   Total Bilirubin 0.5 0.0 - 1.2 mg/dL   GFR, Estimated 23 (L) >60 mL/min   Anion gap 10 5 - 15  Magnesium   Collection Time: 09/02/24  5:18 AM  Result Value Ref Range   Magnesium 2.3 1.7 - 2.4 mg/dL  Phosphorus   Collection Time: 09/02/24  5:18 AM  Result Value Ref Range   Phosphorus 3.0 2.5 - 4.6 mg/dL  CBC with Differential/Platelet   Collection Time: 09/03/24  4:52 AM  Result Value Ref Range   WBC 6.8 4.0 - 10.5 K/uL   RBC 2.85 (L) 3.87 - 5.11 MIL/uL   Hemoglobin 8.5 (L) 12.0 - 15.0 g/dL   HCT 72.2 (L) 63.9 - 53.9 %   MCV 97.2 80.0 - 100.0 fL   MCH 29.8 26.0 - 34.0 pg   MCHC 30.7 30.0 - 36.0 g/dL   RDW 86.6 88.4 - 84.4 %   Platelets 154 150 - 400 K/uL   nRBC 0.3 (H) 0.0 - 0.2 %   Neutrophils Relative % 72 %   Neutro Abs 4.9 1.7 - 7.7 K/uL   Lymphocytes Relative 18 %   Lymphs Abs 1.2 0.7 - 4.0 K/uL   Monocytes Relative 7 %   Monocytes Absolute 0.5 0.1 - 1.0 K/uL   Eosinophils Relative 2 %   Eosinophils Absolute 0.1 0.0 - 0.5 K/uL   Basophils Relative 0 %   Basophils Absolute 0.0 0.0 - 0.1 K/uL   Immature Granulocytes 1 %   Abs Immature Granulocytes 0.08 (H) 0.00 - 0.07 K/uL  Comprehensive metabolic panel with GFR   Collection Time: 09/03/24  4:52 AM  Result Value Ref Range   Sodium 142 135 - 145 mmol/L   Potassium 4.0 3.5 - 5.1 mmol/L   Chloride 108 98 - 111 mmol/L   CO2 21 (L) 22 - 32 mmol/L   Glucose, Bld 95 70 - 99 mg/dL   BUN 25 (H) 8 - 23 mg/dL   Creatinine, Ser 7.75 (H) 0.44 - 1.00 mg/dL   Calcium  8.1 (L) 8.9 - 10.3 mg/dL   Total Protein 4.5 (L) 6.5 - 8.1 g/dL   Albumin  2.1 (L) 3.5 - 5.0 g/dL   AST 14 (L) 15 - 41 U/L   ALT 11 0 - 44 U/L   Alkaline Phosphatase 42 38 - 126 U/L   Total Bilirubin 0.4 0.0 - 1.2 mg/dL   GFR, Estimated 23  (L) >60 mL/min   Anion gap 13 5 - 15  Magnesium   Collection Time: 09/03/24  4:52 AM  Result Value Ref Range   Magnesium 2.3 1.7 - 2.4 mg/dL  Phosphorus   Collection Time: 09/03/24  4:52 AM  Result Value Ref Range   Phosphorus 3.2 2.5 - 4.6 mg/dL    DG Lumbar Spine 1 View Result Date: 08/30/2024 CLINICAL DATA:  Lumbar laminectomy and decompression with micro discectomy. EXAM: LUMBAR SPINE - 1 VIEW COMPARISON:  Lumbar MRI 08/29/2024.  Lumbar  CT 08/04/2024. FINDINGS: Three cross-table lateral views of the lumbar spine are submitted from the operating room. On the initial image at 0914 hours, there is a localizing needle posteriorly between the L3 and L4 spinous processes. On the 2nd view, there is a needle more inferiorly overlying the inferior aspect of the L4 spinous process. The final view at 0935 hours demonstrates skin spreaders and surgical instruments posteriorly at L4-5. IMPRESSION: Intraoperative localization of the L4-5 level. Electronically Signed   By: Elsie Perone M.D.   On: 08/30/2024 15:21   MR LUMBAR SPINE WO CONTRAST Result Date: 08/29/2024 EXAM: MRI LUMBAR SPINE 08/29/2024 07:50:15 PM TECHNIQUE: Multiplanar multisequence MRI of the lumbar spine was performed without the administration of intravenous contrast. COMPARISON: Comparison study 08/18/2004. CLINICAL HISTORY: back pain, fever, recent spine injection FINDINGS: BONES AND ALIGNMENT: Normal and stable alignment of the lumbar vertebral bodies. Normal vertebral body heights. Bone marrow signal is unremarkable. No bone lesions or fractures. No findings suspicious for discitis or osteomyelitis. SPINAL CORD: The conus medullaris terminates at L1-2. SOFT TISSUES: No significant paraspinal or paravertebral abnormalities retroperitoneal findings. T12-L1: No significant findings. L1-L2: No significant findings. L2-L3: No significant findings. L3-L4: No significant findings. L4-L5: Persistent diffuse bulging annulus in conjunction with  advanced facet disease and ligamentum flavum thickening contributing to severe spinal and bilateral lateral recess stenosis. There is a new small fluid collection in the spinal canal posterior to the thecal sac, in between the ligamentum flavum bilaterally. Although this could represent a synovial cyst it was not there on the earlier study from 11/04. It could be related to the injection and be a small liquefied hematoma, or it could be a small abscess. It measures 19 x7 mm. L5-S1: Stable multiple cystic structures posteriorly likely dissecting synovial cysts. Small synovial cysts are noted bilaterally just above the disc space level and appeared to be relatively stable. No disc protrusions, canal or foraminal stenosis at this level. IMPRESSION: 1. New 19 x 7 mm fluid collection posterior to the thecal sac at L4-L5, unlikely a synovial cyst since it was not present on the prior recent MRI, it could be a liquified hematoma or abscess but is certainly contributing to the patient's tight stenosis. 2. Severe spinal canal and bilateral lateral recess stenosis at L4-L5 due to diffuse disc bulge, advanced facet arthropathy, and ligamentum flavum thickening. 3. Stable posterior cystic structures at L5-S1, likely dissecting synovial cysts. 4. Call report initiated. Electronically signed by: Maude Stammer MD 08/29/2024 08:26 PM EST RP Workstation: [id number]   DG CHEST PORT 1 VIEW Result Date: 08/29/2024 EXAM: 1 VIEW(S) XRAY OF THE CHEST 08/29/2024 07:16:00 PM COMPARISON: Chest x-ray 08/04/2024. CLINICAL HISTORY: [id number] Low grade fever [id number] Low grade fever. FINDINGS: LUNGS AND PLEURA: No focal pulmonary opacity. No pleural effusion. No pneumothorax. HEART AND MEDIASTINUM: Atherosclerotic plaque. Similar silhouetting of the left ventricular apex due to known overlying pericardial fat. BONES AND SOFT TISSUES: 2.2 cm ossific density along the shoulder again noted. IMPRESSION: 1. No acute cardiopulmonary process. 2.  Stable ossific density along the shoulder. Electronically signed by: Morgane Naveau MD 08/29/2024 07:30 PM EST RP Workstation: [id number]    Antibiotics:  Anti-infectives (From admission, onward)    Start     Dose/Rate Route Frequency Ordered Stop   09/01/24 1000  vancomycin  (VANCOREADY) IVPB 750 mg/150 mL  Status:  Discontinued        750 mg 150 mL/hr over 60 Minutes Intravenous Every 48 hours 08/30/24 1258 08/31/24 1355   09/01/24 1000  cefTRIAXone  (  ROCEPHIN ) 2 g in sodium chloride  0.9 % 100 mL IVPB  Status:  Discontinued        2 g 200 mL/hr over 30 Minutes Intravenous Every 24 hours 08/31/24 1324 09/03/24 0804   08/30/24 1345  vancomycin  (VANCOREADY) IVPB 750 mg/150 mL        750 mg 150 mL/hr over 60 Minutes Intravenous STAT 08/30/24 1258 08/30/24 1448   08/30/24 1245  cefTRIAXone  (ROCEPHIN ) 2 g in sodium chloride  0.9 % 100 mL IVPB  Status:  Discontinued        2 g 200 mL/hr over 30 Minutes Intravenous Every 12 hours 08/30/24 1147 08/31/24 1324   08/30/24 0915  vancomycin  (VANCOCIN ) IVPB 1000 mg/200 mL premix        1,000 mg 200 mL/hr over 60 Minutes Intravenous  Once 08/30/24 0909 08/30/24 1120   08/30/24 0915  cefTRIAXone  (ROCEPHIN ) 1 g in sodium chloride  0.9 % 100 mL IVPB        1 g 200 mL/hr over 30 Minutes Intravenous  Once 08/30/24 0909 08/30/24 1844   08/29/24 2015  hydroxychloroquine  (PLAQUENIL ) tablet 200 mg        200 mg Oral Daily 08/29/24 1920         Discharge Exam: Blood pressure (!) 128/57, pulse 79, temperature 98.7 F (37.1 C), temperature source Oral, resp. rate 17, height 5' 1 (1.549 m), weight 87.9 kg, SpO2 92%. Neurologic: Grossly normal, except 3/5 L DF and 4/5 R DF Incision CDI  Discharge Medications:   Allergies as of 09/04/2024   No Known Allergies      Medication List     STOP taking these medications    lidocaine  5 % Commonly known as: Lidoderm    traMADol  50 MG tablet Commonly known as: ULTRAM        TAKE these medications     acetaminophen  500 MG tablet Commonly known as: TYLENOL  Take 500 mg by mouth every 6 (six) hours as needed for moderate pain or headache.   CALCIUM  + D PO Take 1 tablet by mouth daily.   carvedilol  12.5 MG tablet Commonly known as: COREG  Take 12.5 mg by mouth 2 (two) times daily.   clopidogrel  75 MG tablet Commonly known as: PLAVIX  TAKE 1 TABLET BY MOUTH EVERY DAY   CORICIDIN HBP PO Take 1 tablet by mouth daily as needed (allergies).   diclofenac sodium 1 % Gel Commonly known as: VOLTAREN Apply 2 g topically 4 (four) times daily as needed (pain).   ezetimibe  10 MG tablet Commonly known as: ZETIA  TAKE 1 TABLET BY MOUTH EVERY DAY   gabapentin  100 MG capsule Commonly known as: NEURONTIN  TAKE 3 CAPSULES BY MOUTH AT BEDTIME What changed: when to take this   HYDROcodone -acetaminophen  7.5-325 MG tablet Commonly known as: NORCO Take 1 tablet by mouth every 6 (six) hours as needed for moderate pain (pain score 4-6).   hydroxychloroquine  200 MG tablet Commonly known as: Plaquenil  Take 1 tablet (200 mg total) by mouth daily.   leflunomide  20 MG tablet Commonly known as: ARAVA  Take 20 mg by mouth daily.   Melatonin 5 MG Caps Take 5 mg by mouth every evening.   nitroGLYCERIN  0.4 MG SL tablet Commonly known as: Nitrostat  PLACE 1 TABLET UNDER THE TONGUE EVERY 5 MINUTES AS NEEDED FOR CHEST PAIN.   rosuvastatin  20 MG tablet Commonly known as: CRESTOR  TAKE 1 TABLET BY MOUTH EVERY DAY   tiZANidine  2 MG tablet Commonly known as: ZANAFLEX  Take 2 mg by mouth at bedtime as  needed.   traZODone  100 MG tablet Commonly known as: DESYREL  Take 100 mg by mouth at bedtime.               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  Discharge Instructions     Call MD for:  difficulty breathing, headache or visual disturbances   Complete by: As directed    Call MD for:  persistant nausea and vomiting   Complete by: As directed    Call MD for:  redness, tenderness, or signs of infection (pain, swelling, redness, odor or green/yellow discharge around incision site)   Complete by: As directed    Call MD for:  severe uncontrolled pain   Complete by: As directed    Call MD for:  temperature >100.4   Complete by: As directed    Diet - low sodium heart healthy   Complete by: As directed    Increase activity slowly   Complete by: As directed    No wound care   Complete by: As directed         Contact information for after-discharge care     Destination     [name] .   Service: Skilled Nursing Contact information: [name] [place] [place]  [id number] [phone]                      Signed: Alm GORMAN Molt 09/04/2024, 10:49 AM

## 2024-09-04 NOTE — Progress Notes (Signed)
 PT Cancellation Note  Patient Details Name: Samantha Mcbride MRN: [id number] DOB: 06/29/1951   Cancelled Treatment:    Reason Eval/Treat Not Completed: Pt reports just finishing up with OT not long ago, however states she would rather wait until she gets to SNF rehab today rather than work with PT this afternoon. Noted AFO in the room. Will continue to follow until d/c.    Leita JONETTA Sable 09/04/2024, 2:14 PM  Leita Sable, PT, DPT Acute Rehabilitation Services Secure Chat Preferred Office: [phone]

## 2024-09-04 NOTE — TOC Transition Note (Addendum)
 Transition of Care [facility]) - Discharge Note   Patient Details  Name: Samantha Mcbride MRN: [id number] Date of Birth: [birth year]  [facility] CM/SW Contact:  Jeoffrey LITTIE Maranda ISRAEL Phone Number: 09/04/2024, 1:39 PM   Clinical Narrative:    [facility] will [facility] to: [name] Anticipated DC date: 09/04/24 Family notified: Yes Transport by: [facility]   Per MD patient ready for DC to [name]. RN to call report prior to discharge [phone] room 806. RN, patient, patient's family, and facility notified of DC. Discharge Summary and FL2 sent to facility. DC packet on chart. Facility can admit pt after 4:30PM. Ambulance transport requested for patient for 4:30PM.   CSW will sign off for now as social work intervention is no longer needed. Please consult us  again if new needs arise.     Final next level of care: Skilled Nursing Facility Barriers to Discharge: Barriers Resolved   Patient Goals and CMS Choice Patient states their goals for this hospitalization and ongoing recovery are:: SNF          Discharge Placement   Existing PASRR number confirmed : 09/04/24          [name] Patient to be transferred to facility by: [facility] Name of family member notified: Kourtney Patient and family notified of of transfer: 09/04/24  Discharge Plan and Services Additional resources added to the After Visit Summary for                                       Social Drivers of Health (SDOH) Interventions SDOH Screenings   Food Insecurity: Food Insecurity Present (08/29/2024)  Housing: Low Risk  (08/29/2024)  Transportation Needs: No Transportation Needs (08/29/2024)  Utilities: Not At Risk (08/29/2024)  Social Connections: Socially Isolated (08/29/2024)  Tobacco Use: Medium Risk (08/30/2024)     Readmission Risk Interventions     No data to display

## 2024-09-22 ENCOUNTER — Ambulatory Visit: Admitting: Podiatry

## 2024-10-01 ENCOUNTER — Other Ambulatory Visit: Payer: Self-pay | Admitting: Cardiology

## 2024-10-20 ENCOUNTER — Ambulatory Visit: Admitting: Podiatry

## 2024-10-20 ENCOUNTER — Encounter: Payer: Self-pay | Admitting: Podiatry

## 2024-10-20 DIAGNOSIS — D689 Coagulation defect, unspecified: Secondary | ICD-10-CM

## 2024-10-20 DIAGNOSIS — M79674 Pain in right toe(s): Secondary | ICD-10-CM | POA: Diagnosis not present

## 2024-10-20 DIAGNOSIS — B351 Tinea unguium: Secondary | ICD-10-CM | POA: Diagnosis not present

## 2024-10-20 DIAGNOSIS — M79675 Pain in left toe(s): Secondary | ICD-10-CM | POA: Diagnosis not present

## 2024-10-20 MED ORDER — GABAPENTIN 300 MG PO CAPS: 300.0000 mg | ORAL_CAPSULE | Freq: Every day | ORAL | 3 refills | Status: AC

## 2024-10-20 NOTE — Progress Notes (Signed)
 "  Subjective:  Patient ID: Samantha Mcbride, female    DOB: 01-Feb-1951,  MRN: [id number] HPI Chief Complaint  Patient presents with   Medication Management    Needs refill on gabapentin  300mg  at bedtime    Debridement    Trim toenails    74 y.o. female presents with the above complaint.   ROS: Denies fever chills nausea vomiting muscle aches pains calf pain back pain chest pain shortness of breath.  She presents with her daughter today states that she just had back surgery to decompress the nerve on the left side by Dr. Alm Molt.  She goes on to say that the gabapentin  300 mg at nighttime has been doing very well for her.  Past Medical History:  Diagnosis Date   Anemia    iron deficiency anemia    CAD S/P percutaneous coronary angioplasty 08/2011   Promus DES - mid LAD 3.5 mm x 24 mm (3.72 mm)    Cancer (HCC)    Chronic kidney disease    stage 4 kidney disease per pt dx 02/2019   Complication of anesthesia    Dyslipidemia, goal LDL below 70     on statin, close to goal   Family history of breast cancer    Family history of skin cancer    Former moderate cigarette smoker (10-19 per day)    Glucose intolerance (impaired glucose tolerance)    H/O thyroid  nodule    Benign   History 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 --> Promus DES =>  EF 40 to 45% (similar to 2012) stable wall motion normality (severe HK of midapical anterior apical wall-consistent with prior infarct).  GRII DD.  Normal valves. => Stable compared to 08/30/2011.  EF was still 40 to 45%.   Hypertension    Personal history of radiation therapy    PONV (postoperative nausea and vomiting)    no issues with surgery on 05-11-2019   Rheumatoid arthritis (HCC)    managed on humira    SOB (shortness of breath) on exertion    reports its been that way since my heart attack  repots no recurrence of MI sx since that time    Past Surgical  History:  Procedure Laterality Date   ABDOMINAL AORTAGRAM N/A 08/27/2011   Procedure: ABDOMINAL EZELLA;  Surgeon: Debby DELENA Sor, MD;  Location: [facility] CATH LAB;  Service: Cardiovascular;  Laterality: N/A;   ANKLE SURGERY Right 1995   per pt ankle surgery here at [facility]    APPENDECTOMY     BREAST EXCISIONAL BIOPSY Left    BREAST LUMPECTOMY Right 07/2020   BREAST LUMPECTOMY WITH RADIOACTIVE SEED AND SENTINEL LYMPH NODE BIOPSY Right 08/05/2020   Procedure: RIGHT BREAST LUMPECTOMY WITH RADIOACTIVE SEED AND SENTINEL LYMPH NODE BIOPSY;  Surgeon: Curvin Deward MOULD, MD;  Location: [facility] OR;  Service: General;  Laterality: Right;   CYSTOSCOPY WITH RETROGRADE PYELOGRAM, URETEROSCOPY AND STENT PLACEMENT Bilateral 05/11/2019   Procedure: CYSTOSCOPY WITH RETROGRADE PYELOGRAM,  AND STENT PLACEMENT;  Surgeon: Carolee Sherwood JONETTA MOULD, MD;  Location: [facility] ORS;  Service: Urology;  Laterality: Bilateral;   CYSTOSCOPY/URETEROSCOPY/HOLMIUM LASER/STENT PLACEMENT Bilateral 05/25/2019   Procedure: CYSTOSCOPY BILATERAL URETEROSCOPY/HOLMIUM LASER/STENT PLACEMENT;  Surgeon: Carolee Sherwood JONETTA MOULD, MD;  Location: [facility] ORS;  Service: Urology;  Laterality: Bilateral;   INTRAMEDULLARY (IM) NAIL INTERTROCHANTERIC Left 10/18/2022   Procedure: INTRAMEDULLARY NAILING OF LEFT FEMUR;  Surgeon: Celena Sharper, MD;  Location: [facility] OR;  Service: Orthopedics;  Laterality: Left;   LEFT HEART CATHETERIZATION WITH CORONARY ANGIOGRAM N/A 08/27/2011   Procedure: LEFT HEART CATHETERIZATION WITH CORONARY ANGIOGRAM;  Surgeon: Debby DELENA Sor, MD;  Location: [facility] CATH LAB;  Service: Cardiovascular;;For anterior STEMI/cardiac arrest -- 100% mid LAD occlusion   LUMBAR LAMINECTOMY/DECOMPRESSION MICRODISCECTOMY N/A 08/30/2024   Procedure: LUMBAR LAMINECTOMY/DECOMPRESSION MICRODISCECTOMY 1 LEVEL;  Surgeon: Joshua Alm Hamilton, MD;  Location: [facility] OR;  Service: Neurosurgery;  Laterality: N/A;  LUMBAR FOUR-LUMBAR FIVE LAMINECTOMY   PERCUTANEOUS CORONARY STENT INTERVENTION (PCI-S)  N/A 08/27/2011   Procedure: PERCUTANEOUS CORONARY STENT INTERVENTION (PCI-S);  Surgeon: Debby DELENA Sor, MD;  Location: [facility] CATH LAB;  Service: Cardiovascular;;  mid LAD PCI --> Promus Element DES 3.5 mm at 24 mm (3.72 mm)   TRANSTHORACIC ECHOCARDIOGRAM  08/2011   EF 40-45%, moderate at K. of mid and distal inferior septum and anterior apical myocardium. Grade 1 diastolic function. -- Followup echocardiogram to reassess his EF was denied by insurance company   TRANSTHORACIC ECHOCARDIOGRAM  07/20/2020    EF 40 to 45% (similar to 2012) stable wall motion normality (severe HK of midapical anterior apical wall-consistent with prior infarct).  GRII DD.  Normal valves. => Stable compared to 08/30/2011.  EF was still 40 to 45%.   Current Medications[1]  Allergies[2] Review of Systems Objective:  There were no vitals filed for this visit.  General: Well developed, nourished, in no acute distress, alert and oriented x3   Dermatological: Skin is warm, dry and supple bilateral. Nails x 10 are well maintained but are thick slightly elongated and tender on palpation.; remaining integument appears unremarkable at this time. There are no open sores, no preulcerative lesions, no rash or signs of infection present.  Vascular: Dorsalis Pedis artery and Posterior Tibial artery pedal pulses are 2/4 bilateral with immedate capillary fill time. Pedal hair growth present. No varicosities and no lower extremity edema present bilateral.   Neruologic: Grossly intact via light touch bilateral. Vibratory intact via tuning fork bilateral. Protective threshold with Semmes Wienstein monofilament intact to all pedal sites bilateral. Patellar and Achilles deep tendon reflexes 2+ bilateral. No Babinski or clonus noted bilateral.   Musculoskeletal: No gross boney pedal deformities bilateral. No pain, crepitus, or limitation noted with foot and ankle range of motion bilateral. Muscular strength 5/5 in all groups tested  bilateral.  Gait: Unassisted, Nonantalgic.    Radiographs:  None taken  Assessment & Plan:   Assessment: Pain in limb secondary to onychomycosis.  Neuropathy.  Plan: Refill her gabapentin  300 mg 1 p.o. nightly and debrided her toenails 1 through 5 bilateral.     Lille Karim T. Efe Fazzino, DPM    [1]  Current Outpatient Medications:    gabapentin  (NEURONTIN ) 300 MG capsule, Take 1 capsule (300 mg total) by mouth at bedtime., Disp: 90 capsule, Rfl: 3   potassium chloride  SA (KLOR-CON  M) 20 MEQ tablet, Take 20 mEq by mouth daily., Disp: , Rfl:    XIIDRA 5 % SOLN, Apply 1 drop to eye 2 (two) times daily., Disp: , Rfl:    acetaminophen  (TYLENOL ) 500 MG tablet, Take 500 mg by mouth every 6 (six) hours as needed for moderate pain or headache., Disp: , Rfl:    Calcium  Citrate-Vitamin D  (CALCIUM  + D PO), Take 1 tablet by mouth daily., Disp: , Rfl:    carvedilol  (COREG ) 12.5 MG tablet, Take 12.5 mg by mouth 2 (two) times daily., Disp: , Rfl:    clopidogrel  (PLAVIX ) 75 MG tablet, TAKE 1 TABLET BY MOUTH  EVERY DAY, Disp: 90 tablet, Rfl: 3   diclofenac sodium (VOLTAREN) 1 % GEL, Apply 2 g topically 4 (four) times daily as needed (pain). , Disp: , Rfl:    DM-APAP-CPM (CORICIDIN HBP PO), Take 1 tablet by mouth daily as needed (allergies)., Disp: , Rfl:    ezetimibe  (ZETIA ) 10 MG tablet, TAKE 1 TABLET BY MOUTH EVERY DAY, Disp: 90 tablet, Rfl: 3   HYDROcodone -acetaminophen  (NORCO) 7.5-325 MG tablet, Take 1 tablet by mouth every 6 (six) hours as needed for moderate pain (pain score 4-6)., Disp: 30 tablet, Rfl: 0   hydroxychloroquine  (PLAQUENIL ) 200 MG tablet, Take 1 tablet (200 mg total) by mouth daily., Disp: , Rfl:    leflunomide  (ARAVA ) 20 MG tablet, Take 20 mg by mouth daily., Disp: , Rfl:    Melatonin 5 MG CAPS, Take 5 mg by mouth every evening., Disp: , Rfl:    nitroGLYCERIN  (NITROSTAT ) 0.4 MG SL tablet, PLACE 1 TABLET UNDER THE TONGUE EVERY 5 MINUTES AS NEEDED FOR CHEST PAIN. (Patient not taking:  Reported on 08/29/2024), Disp: 25 tablet, Rfl: 4   rosuvastatin  (CRESTOR ) 20 MG tablet, TAKE 1 TABLET BY MOUTH EVERY DAY, Disp: 90 tablet, Rfl: 3   tiZANidine  (ZANAFLEX ) 2 MG tablet, Take 2 mg by mouth at bedtime as needed., Disp: , Rfl:    traZODone  (DESYREL ) 100 MG tablet, Take 100 mg by mouth at bedtime. , Disp: , Rfl:  [2] No Known Allergies  "
